# Patient Record
Sex: Male | Born: 1945
Health system: Southern US, Community
[De-identification: ages and names within clinical notes are randomized; demographics above are authoritative.]

## PROBLEM LIST (undated history)

## (undated) DIAGNOSIS — K4090 Unilateral inguinal hernia, without obstruction or gangrene, not specified as recurrent: Secondary | ICD-10-CM

## (undated) DIAGNOSIS — N189 Chronic kidney disease, unspecified: Secondary | ICD-10-CM

## (undated) DIAGNOSIS — F419 Anxiety disorder, unspecified: Secondary | ICD-10-CM

## (undated) DIAGNOSIS — C801 Malignant (primary) neoplasm, unspecified: Secondary | ICD-10-CM

## (undated) DIAGNOSIS — I10 Essential (primary) hypertension: Secondary | ICD-10-CM

## (undated) DIAGNOSIS — R05 Cough: Secondary | ICD-10-CM

## (undated) DIAGNOSIS — M109 Gout, unspecified: Secondary | ICD-10-CM

## (undated) DIAGNOSIS — Z923 Personal history of irradiation: Secondary | ICD-10-CM

## (undated) DIAGNOSIS — E119 Type 2 diabetes mellitus without complications: Secondary | ICD-10-CM

## (undated) DIAGNOSIS — E785 Hyperlipidemia, unspecified: Secondary | ICD-10-CM

## (undated) DIAGNOSIS — R053 Chronic cough: Secondary | ICD-10-CM

## (undated) DIAGNOSIS — M1990 Unspecified osteoarthritis, unspecified site: Secondary | ICD-10-CM

## (undated) HISTORY — DX: Type 2 diabetes mellitus without complications: E11.9

## (undated) HISTORY — PX: COLONOSCOPY W/ POLYPECTOMY: SHX1380

## (undated) HISTORY — PX: KNEE SURGERY: SHX244

## (undated) HISTORY — DX: Essential (primary) hypertension: I10

## (undated) HISTORY — PX: LYMPH NODE BIOPSY: SHX201

## (undated) HISTORY — PX: OTHER SURGICAL HISTORY: SHX169

## (undated) HISTORY — DX: Hyperlipidemia, unspecified: E78.5

---

## 2014-11-13 ENCOUNTER — Other Ambulatory Visit (HOSPITAL_COMMUNITY)
Admission: RE | Admit: 2014-11-13 | Discharge: 2014-11-13 | Disposition: A | Discharge status: Home / Self Care | Payer: Medicare Other | Source: Ambulatory Visit | Attending: Otolaryngology | Admitting: Otolaryngology

## 2014-11-13 DIAGNOSIS — R221 Localized swelling, mass and lump, neck: Secondary | ICD-10-CM | POA: Diagnosis present

## 2014-11-15 ENCOUNTER — Other Ambulatory Visit: Payer: Self-pay | Admitting: Otolaryngology

## 2014-11-15 DIAGNOSIS — R221 Localized swelling, mass and lump, neck: Secondary | ICD-10-CM

## 2014-11-21 ENCOUNTER — Ambulatory Visit
Admission: RE | Admit: 2014-11-21 | Discharge: 2014-11-21 | Disposition: A | Discharge status: Home / Self Care | Payer: Medicare Other | Source: Ambulatory Visit | Attending: Otolaryngology | Admitting: Otolaryngology

## 2014-11-21 DIAGNOSIS — R221 Localized swelling, mass and lump, neck: Secondary | ICD-10-CM

## 2014-11-21 MED ORDER — IOHEXOL 300 MG/ML  SOLN: 50.0000 mL | Freq: Once | INTRAMUSCULAR | Status: AC | PRN

## 2014-11-25 ENCOUNTER — Telehealth: Payer: Self-pay | Admitting: *Deleted

## 2014-11-25 ENCOUNTER — Telehealth: Payer: Self-pay | Admitting: Hematology and Oncology

## 2014-11-25 ENCOUNTER — Encounter: Payer: Self-pay | Admitting: *Deleted

## 2014-11-25 NOTE — Progress Notes (Signed)
Entry error

## 2014-11-25 NOTE — Telephone Encounter (Addendum)
Oncology Nurse Navigator Documentation  Oncology Nurse Navigator Flowsheets 11/25/2014  Referral date to RadOnc/MedOnc 02774  Navigator Encounter Type Introductory phone call  Time Spent with Patient 15   Placed introductory call to new referral patient. 1. Introduced myself as the oncology nurse navigator that works with Dr.Murray to whom he has been referred by Dr. Redmond Baseman and with whom he has an appt this Wed at 10:30.  He understands this appt is preceded by a NE at 10:00.  2. I briefly explained my role as his navigator, I indicated that I would be joining him during the appt. 3. I confirmed his understanding of the [facility] location, explained the arrival and RadOnc registration process. 4. I provided him my contact information, encouraged him to call with questions/concerns before Wed. 5. He indicated understanding Dr. Redmond Baseman is also referring him to [place].  I later LVM with Dr. Redmond Baseman office asking for a) confirmation of this referral, b) copy of bx conducted 5/5(6) per patient. 6. He stated he is leaving this Friday for a 3-week stay in [place]. 7. He verbalized understanding of information provided, expressed appreciation for my call.  Gayleen Orem, RN, BSN, [place] at [place] [phone]

## 2014-11-25 NOTE — Telephone Encounter (Signed)
Patient scheduled to see MD 05/17 @ 3:30.

## 2014-11-25 NOTE — Telephone Encounter (Signed)
Oncology Nurse Navigator Documentation  Oncology Nurse Navigator Flowsheets 11/25/2014 11/25/2014  Referral date to RadOnc/MedOnc 64051 -  Navigator Encounter Type Introductory phone call Telephone  Time Spent with Patient 15 15   Called patient, he confirmed his ability to see Dr. Alvy Bimler tomorrow at 3:30.  Rhys Martini and Dr. Alvy Bimler notified.  Gayleen Orem, RN, BSN, [place] at [place] [phone]

## 2014-11-26 ENCOUNTER — Encounter: Payer: Self-pay | Admitting: Hematology and Oncology

## 2014-11-26 ENCOUNTER — Telehealth: Payer: Self-pay | Admitting: Hematology and Oncology

## 2014-11-26 ENCOUNTER — Encounter: Payer: Self-pay | Admitting: Radiation Oncology

## 2014-11-26 ENCOUNTER — Telehealth: Payer: Self-pay | Admitting: *Deleted

## 2014-11-26 ENCOUNTER — Ambulatory Visit (HOSPITAL_BASED_OUTPATIENT_CLINIC_OR_DEPARTMENT_OTHER): Payer: Medicare Other | Admitting: Hematology and Oncology

## 2014-11-26 ENCOUNTER — Ambulatory Visit: Payer: Medicare Other

## 2014-11-26 ENCOUNTER — Encounter: Payer: Self-pay | Admitting: *Deleted

## 2014-11-26 VITALS — BP 153/72 | HR 78 | Temp 98.1°F | Resp 18 | Ht 68.0 in | Wt 178.1 lb

## 2014-11-26 DIAGNOSIS — C119 Malignant neoplasm of nasopharynx, unspecified: Secondary | ICD-10-CM | POA: Diagnosis not present

## 2014-11-26 DIAGNOSIS — Z85819 Personal history of malignant neoplasm of unspecified site of lip, oral cavity, and pharynx: Secondary | ICD-10-CM | POA: Insufficient documentation

## 2014-11-26 MED ORDER — LORAZEPAM 1 MG PO TABS
1.0000 mg | ORAL_TABLET | Freq: Two times a day (BID) | ORAL | Status: DC | PRN
Start: 2014-11-26 — End: 2014-12-23

## 2014-11-26 NOTE — Progress Notes (Addendum)
Head and Neck Cancer Location of Tumor / Histology: Nasopharyngeal  Patient presented with bilateral palpable neck lesions 8 months prior to 11/21/14.he denies any hearing deficit, difficulties with chewing food, swallowing difficulties, painful swallowing, changes in the quality of his voice or abnormal weight loss. He complained of persistent nasal drainage but denies epistaxis.    CT Neck IMPRESSION: 1. Left nasopharyngeal soft tissue mass with left > right level 2B and level 5 ex-nodal metastatic disease. Poorly marginated primary lesion measuring up to 30-35 mm. Ex-nodal disease encompassing 54 mm on the left and 34 mm on the right. 2. Top differential considerations are primary nasopharyngeal carcinoma versus pharyngeal squamous cell carcinoma. No skullbase invasion is evident by CT. 3. No distant metastatic disease identified in the upper chest. 4. Left mastoid effusion. Paranasal sinus inflammation.  Biopsies of Left Neck Mass (if applicable) revealed: Dr. Melida Quitter: Biopsy of the left neck  5/6/216 Malignant Cells Consistent  With Squamous Cell Carcinoma  Nutrition Status Yes No Comments  Weight changes? []  [x]  Weight 178.1 on 11/26/14  Swallowing concerns? []  [x]    PEG? []  []     Referrals Yes No Comments  Social Work? []  []    Dentistry? []  []    Swallowing therapy? []  []  Reports Post Nasal Drip on left side  Nutrition? []  []    Med/Onc? [x]  []  Dr. Alvy Bimler.  Chemo EDU 11/28/14   Safety Issues Yes No Comments  Prior radiation? []  [x]    Pacemaker/ICD? []  [x]    Possible current pregnancy? []  [x]    Is the patient on methotrexate? []  [x]     Tobacco/Marijuana/Snuff/ETOH use: former Smoker. 1ppd x 25 years, stopped in jan. 2000   Past/Anticipated interventions by otolaryngology, if any: Dr. Melida Quitter : Biopsy Left neck mass  Past/Anticipated interventions by medical oncology, if any: Dr. Heath Lark: Seen on 11/26/14   Current Complaints / other details:     Denies  any dysphagia nor odonyphagia and denies weight loss

## 2014-11-26 NOTE — Telephone Encounter (Addendum)
Oncology Nurse Navigator Documentation  Oncology Nurse Navigator Flowsheets 11/25/2014 11/25/2014 11/26/2014  Referral date to RadOnc/MedOnc 75797 - -  Navigator Encounter Type Introductory phone call Telephone Telephone  Time Spent with Patient 15 15 15     LVM request with Dr. Redmond Baseman' RN for faxing of patient's bx report to my attention.  Gayleen Orem, RN, BSN, [place] at [place] [phone]

## 2014-11-26 NOTE — Telephone Encounter (Signed)
Added appts...the patient will pick up appts

## 2014-11-26 NOTE — Progress Notes (Signed)
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 male is here because of newly diagnosed squamous cell carcinoma. According to the patient, the first initial presentation was due to self palpated bilateral neck lumps. He stated that he had felt them for over 6-8 months. Over the last 6 weeks, the left neck lymphadenopathy has increased in size significantly. He saw his primary care doctor and was subsequently referred to see ENT he denies any hearing deficit, difficulties with chewing food, swallowing difficulties, painful swallowing, changes in the quality of his voice or abnormal weight loss. He complained of persistent nasal drainage but denies epistaxis. At the ENT office, he underwent fine-needle aspirate of the left neck mass. Summary of oncologic history is as follows:   Nasopharyngeal cancer   11/13/2014 Procedure FNA of neck mass showed squamous cell carcinoma [id number]   11/21/2014 Imaging CT neck showed nasopharyngeal mass and bilateral cervical lymphadenopathy   MEDICAL HISTORY:  Past Medical History  Diagnosis Date  . Hyperlipidemia   . Diabetes mellitus without complication   . Hypertension     SURGICAL HISTORY: Past Surgical History  Procedure Laterality Date  . Ln biopsy    . Colonoscopy    . Knee surgery Left     SOCIAL HISTORY: History   Social History  . Marital Status: Unknown    Spouse Name: N/A  . Number of Children: N/A  . Years of Education: N/A   Occupational History  . Not on file.   Social History Main Topics  . Smoking status: Former Smoker -- 1.00 packs/day for 25 years    Quit date: 07/12/1998  . Smokeless tobacco: Never Used  . Alcohol Use: No  .  Drug Use: No  . Sexual Activity: Not on file   Other Topics Concern  . Not on file   Social History Narrative  . No narrative on file    FAMILY HISTORY: Family History  Problem Relation Age of Onset  . Cancer Mother     throat ca  . Cancer Father     lung ca  . Cancer Brother     throat ca    ALLERGIES:  has No Known Allergies.  MEDICATIONS:  Current Outpatient Prescriptions  Medication Sig Dispense Refill  . amLODipine (NORVASC) 5 MG tablet Take 5 mg by mouth daily.    Marland Kitchen atorvastatin (LIPITOR) 20 MG tablet Take 20 mg by mouth daily.    . diclofenac (VOLTAREN) 75 MG EC tablet Take 75 mg by mouth as needed.    Marland Kitchen glimepiride (AMARYL) 2 MG tablet Take 2 mg by mouth daily.  4  . losartan (COZAAR) 50 MG tablet Take 50 mg by mouth daily.  4  . Multiple Vitamins-Minerals (CENTRUM SILVER PO) Take by mouth daily.    . Omega-3 Fatty Acids (FISH OIL PO) Take by mouth daily.    Marland Kitchen LORazepam (ATIVAN) 1 MG tablet Take 1 tablet (1 mg total) by mouth 2 (two) times daily as needed for anxiety. 10 tablet 0   No current facility-administered medications for this visit.    REVIEW OF SYSTEMS:   Constitutional: Denies fevers, chills or abnormal night sweats Eyes: Denies blurriness of vision, double vision or watery eyes Ears, nose, mouth, throat, and face: Denies mucositis or sore throat  Respiratory: Denies cough, dyspnea or wheezes Cardiovascular: Denies palpitation, chest discomfort or lower extremity swelling Gastrointestinal:  Denies nausea, heartburn or change in bowel habits Skin: Denies abnormal skin rashes Neurological:Denies numbness, tingling or new weaknesses Behavioral/Psych: Mood is stable, no new changes  All other systems were reviewed with the patient and are negative.  PHYSICAL EXAMINATION: ECOG PERFORMANCE STATUS: 0 - Asymptomatic  Filed Vitals:   11/26/14 1535  BP: 153/72  Pulse: 78  Temp: 98.1 F (36.7 C)  Resp: 18   Filed Weights   11/26/14 1535  Weight:  178 lb 1.6 oz (80.786 kg)    GENERAL:alert, no distress and comfortable SKIN: skin color, texture, turgor are normal, no rashes or significant lesions EYES: normal, conjunctiva are pink and non-injected, sclera clear OROPHARYNX:no exudate, no erythema and lips, buccal mucosa, and tongue normal  NECK: supple, thyroid normal size, non-tender, without nodularity LYMPH:  Palpable lymphadenopathy in both Thal of the neck. Non-elsewhere.  LUNGS: clear to auscultation and percussion with normal breathing effort HEART: regular rate & rhythm and no murmurs and no lower extremity edema ABDOMEN:abdomen soft, non-tender and normal bowel sounds Musculoskeletal:no cyanosis of digits and no clubbing  PSYCH: alert & oriented x 3 with fluent speech NEURO: no focal motor/sensory deficits  LABORATORY DATA: N/A RADIOGRAPHIC STUDIES: I have personally reviewed the radiological images as listed and agreed with the findings in the report. Ct Soft Tissue Neck W Contrast  11/21/2014   CLINICAL DATA:  69 year old male with bilateral palpable neck lesions discovered 8 months ago. Recently increased size of the left lesion. No associated pain. Initial encounter.  EXAM: CT NECK WITH CONTRAST  TECHNIQUE: Multidetector CT imaging of the neck was performed using the standard protocol following the bolus administration of intravenous contrast.  CONTRAST:  50 mL Omnipaque 300.  COMPARISON:  None.  FINDINGS: Pharynx and larynx: Abnormal mass like soft tissue contour of the left nasopharynx at or just above the fossa of Rosenmuller (series 3, image 15 and coronal image 31). Posterior and lateral margins are indistinct. This encompasses 26 x 35 x 29 mm (AP by transverse by CC). Effacement of the left parapharyngeal fat. The lesion is inseparable from the left longus coli muscle. This is at the level of the pterygoid plates.  Below this the palatine tonsils and soft palate appear within normal limits. The hypopharynx, epiglottis  and larynx appear within normal limits. The retropharyngeal and sublingual spaces are within normal limits.  Salivary glands: Submandibular and parotid glands are within normal limits.  Thyroid: Negative.  Lymph nodes:  Spiculated and conglomerate and left level 2/level 5 nodal mass on the left encompassing 54 x 22 x 40 mm (AP by transverse by CC). See sagittal image 49. The anterior extent of this is partially cystic or necrotic (series 3, image 27). The caudal posterior aspect corresponds to the marked palpable area of concern.  Similarly heterogeneous and spiculated right level 2 B nodal mass measuring 21 x 19 x 34 mm. See sagittal image 20. This corresponds to the marked palpable area of concern.  Mild stranding of the left level 5 soft tissues below the conglomerate nodal mass. Small bilateral level 5 nodes are normal by size criteria and morphology. The anterior level IIa nodes bilaterally also appear within normal limits. No level 1 lymphadenopathy. Mildly asymmetric but normal by size criteria left level IIIb node on series 3, image 47. No level 4 lymphadenopathy.  Vascular: Bilateral carotid calcified plaque. Major vascular structures appear to remain patent. The  left carotid space an the right IJ are abutted by the conglomerate nodal masses.  Limited intracranial: Negative.  Visualized orbits: Negative.  Mastoids and visualized paranasal sinuses: Widespread paranasal sinus mucosal thickening and opacification. Left mastoid effusion. Both tympanic cavities are clear. Right mastoids are clear.  Skeleton: Bone mineralization at the skullbase appears within normal limits. The clivus is within normal limits.  Degenerative changes throughout the cervical spine. No acute or suspicious osseous lesion identified.  Upper chest: No superior mediastinal lymphadenopathy. Mild dependent atelectasis. No upper lung nodule. No axillary lymphadenopathy.  IMPRESSION: 1. Left nasopharyngeal soft tissue mass with left > right  level 2B and level 5 ex-nodal metastatic disease. Poorly marginated primary lesion measuring up to 30-35 mm. Ex-nodal disease encompassing 54 mm on the left and 34 mm on the right. 2. Top differential considerations are primary nasopharyngeal carcinoma versus pharyngeal squamous cell carcinoma. No skullbase invasion is evident by CT. 3. No distant metastatic disease identified in the upper chest. 4. Left mastoid effusion.  Paranasal sinus inflammation.   Electronically Signed   By: Genevie Ann M.D.   On: 11/21/2014 15:03    ASSESSMENT:  Newly diagnosed squamous cell carcinoma of the Head & Neck  PLAN:  Nasopharyngeal cancer The patient has significant advanced presentation of suspected nasopharyngeal carcinoma. We discussed the importance of staging. Previously, he had mild claustrophobia with MRI. I gave him prescription lorazepam to use. I plan to restage him with MRI along with PET CT scan. His wife indicated the patient have chronic renal failure in the past. That might limit therapeutic options. I plan to order blood work to be done tomorrow before he sees radiation oncologist. He would need a multidisciplinary team approach and I have sent multiple referrals to see nutritionist, speech & language therapist, dentist, physical therapist, social worker and attend chemotherapy education class. The patient had originally planned to leave Friday evening to spend 3 weeks at his daughter's home in [place]. This would significantly delay the start of treatment and evaluation. I warned him about potential disease progression in the next few weeks and that could potentially lead to irreversible organ damage such as losing eyesight if the tumor starts invading into the orbit, cranial nerve dysfunction or potentially cause severe nosebleeds. He would think about whether he wants to return home early to complete all the necessary workup so that we can start treatment as soon as possible. I will get his case  presented at the ENT tumor board tomorrow. The patient may need placement of port and feeding tube in the future but I am holding off putting those orders until results of imaging studies are available.      Orders Placed This Encounter  Procedures  . NM PET Image Initial (PI) Skull Base To Thigh    Standing Status: Future     Number of Occurrences:      Standing Expiration Date: 12/31/2015    Order Specific Question:  Reason for Exam (SYMPTOM  OR DIAGNOSIS REQUIRED)    Answer:  staging nasopharyngeal ca    Order Specific Question:  Preferred imaging location?    Answer:  [facility]  . MR Brain W Wo Contrast    Standing Status: Future     Number of Occurrences:      Standing Expiration Date: 12/31/2015    Order Specific Question:  Reason for exam:    Answer:  nasopharyngeal ca staging    Order Specific Question:  Preferred imaging location?  Answer:  [facility]    Order Specific Question:  Does the patient have a pacemaker or implanted devices?    Answer:  No  . CBC with Differential/Platelet    Standing Status: Future     Number of Occurrences:      Standing Expiration Date: 12/31/2015  . Comprehensive metabolic panel    Standing Status: Future     Number of Occurrences:      Standing Expiration Date: 12/31/2015  . Ambulatory Referral to Speech Therapy  (specifically to Garald Balding)    Referral Priority:  Routine    Referral Type:  Speech Therapy    Referral Reason:  Specialty Services Required    Requested Specialty:  Speech Pathology    Number of Visits Requested:  1  . Ambulatory Referral to Dentistry (specifically to Dr. Enrique Sack)    Referral Priority:  Routine    Referral Type:  Consultation    Referral Reason:  Specialty Services Required    Requested Specialty:  Dental General Practice    Number of Visits Requested:  1  . Amb Referral to Nutrition and Diabetic Education (specifically to Ernestene Kiel)    Referral Priority:  Routine     Referral Type:  Consultation    Referral Reason:  Specialty Services Required    Number of Visits Requested:  1  . Ambulatory Referral to Social Work    Referral Priority:  Routine    Referral Type:  Consultation    Referral Reason:  Specialty Services Required    Number of Visits Requested:  1  . Ambulatory Referral to Physical Therapy    Referral Priority:  Routine    Referral Type:  Physical Medicine    Referral Reason:  Specialty Services Required    Requested Specialty:  Physical Therapy    Number of Visits Requested:  1    All questions were answered. The patient knows to call the clinic with any problems, questions or concerns. I spent 55 minutes counseling the patient face to face. The total time spent in the appointment was 60 minutes and more than 50% was on counseling.     [place], [place], MD 11/26/2014 4:30 PM

## 2014-11-26 NOTE — Progress Notes (Signed)
Oncology Nurse Navigator Documentation  Oncology Nurse Navigator Flowsheets 11/25/2014 11/25/2014 11/26/2014 11/26/2014  Referral date to RadOnc/MedOnc 04599 - - -  Navigator Encounter Type Introductory phone call Telephone Telephone Initial MedOnc  Patient Visit Type - - - Medonc  Barriers/Navigation Needs - - - No barriers at this time  Interventions - - - None required  Time Spent with Patient _0 90   Met with patient during initial consult with Dr. Alvy Bimler.  His wife accompanied him.   Further introduced myself as his Navigator, explained my role as a member of the Care Team, provided contact information, encouraged them to contact me with questions/concerns as treatments/procedures begin. Provided New Patient Information packet:  Contact information for physicians, navigator and other members of the Care Team.  Fall Prevention Patient Safety Plan.  Appointment Guideline.  [place] with highlight of [place]. Patient indicated he has Advance Directives, will bring copies for scanning. We discussed options to planned vacation schedule in context of pending PET, MRI, dental consult, potential oral surgery.  We will discuss further tomorrow. Assisted with post-consult appt scheduling. We discussed the arrival procedure for tomorrow's appt with Dr. Valere Dross. They understand I will be joining them tomorrow.  Gayleen Orem, RN, BSN, [place] at [place] [phone]

## 2014-11-26 NOTE — Progress Notes (Signed)
Entry error

## 2014-11-26 NOTE — Telephone Encounter (Signed)
Entry error

## 2014-11-26 NOTE — Progress Notes (Signed)
Checked in new pt with no financial concerns. °

## 2014-11-26 NOTE — Assessment & Plan Note (Signed)
The patient has significant advanced presentation of suspected nasopharyngeal carcinoma. We discussed the importance of staging. Previously, he had mild claustrophobia with MRI. I gave him prescription lorazepam to use. I plan to restage him with MRI along with PET CT scan. His wife indicated the patient have chronic renal failure in the past. That might limit therapeutic options. I plan to order blood work to be done tomorrow before he sees radiation oncologist. He would need a multidisciplinary team approach and I have sent multiple referrals to see nutritionist, speech & language therapist, dentist, physical therapist, social worker and attend chemotherapy education class. The patient had originally planned to leave Friday evening to spend 3 weeks at his daughter's home in [place]. This would significantly delay the start of treatment and evaluation. I warned him about potential disease progression in the next few weeks and that could potentially lead to irreversible organ damage such as losing eyesight if the tumor starts invading into the orbit, cranial nerve dysfunction or potentially cause severe nosebleeds. He would think about whether he wants to return home early to complete all the necessary workup so that we can start treatment as soon as possible. I will get his case presented at the ENT tumor board tomorrow. The patient may need placement of port and feeding tube in the future but I am holding off putting those orders until results of imaging studies are available.

## 2014-11-27 ENCOUNTER — Encounter: Payer: Self-pay | Admitting: *Deleted

## 2014-11-27 ENCOUNTER — Ambulatory Visit
Admission: RE | Admit: 2014-11-27 | Discharge: 2014-11-27 | Disposition: A | Discharge status: Home / Self Care | Payer: Medicare Other | Source: Ambulatory Visit | Attending: Radiation Oncology | Admitting: Radiation Oncology

## 2014-11-27 ENCOUNTER — Encounter: Payer: Self-pay | Admitting: Radiation Oncology

## 2014-11-27 ENCOUNTER — Telehealth: Payer: Self-pay | Admitting: *Deleted

## 2014-11-27 ENCOUNTER — Telehealth: Payer: Self-pay | Admitting: Hematology and Oncology

## 2014-11-27 ENCOUNTER — Other Ambulatory Visit (HOSPITAL_BASED_OUTPATIENT_CLINIC_OR_DEPARTMENT_OTHER): Payer: Medicare Other

## 2014-11-27 VITALS — BP 165/71 | HR 79 | Temp 98.1°F | Ht 68.0 in | Wt 177.5 lb

## 2014-11-27 DIAGNOSIS — C119 Malignant neoplasm of nasopharynx, unspecified: Secondary | ICD-10-CM | POA: Diagnosis not present

## 2014-11-27 LAB — COMPREHENSIVE METABOLIC PANEL (CC13)
ALBUMIN: 3.5 g/dL (ref 3.5–5.0)
ALK PHOS: 72 U/L (ref 40–150)
ALT: 24 U/L (ref 0–55)
AST: 22 U/L (ref 5–34)
Anion Gap: 11 mEq/L (ref 3–11)
BUN: 29.5 mg/dL — ABNORMAL HIGH (ref 7.0–26.0)
CO2: 23 meq/L (ref 22–29)
Calcium: 9.2 mg/dL (ref 8.4–10.4)
Chloride: 108 mEq/L (ref 98–109)
Creatinine: 1.4 mg/dL — ABNORMAL HIGH (ref 0.7–1.3)
EGFR: 54 mL/min/{1.73_m2} — ABNORMAL LOW (ref >90)
Glucose: 129 mg/dl (ref 70–140)
Potassium: 4.6 mEq/L (ref 3.5–5.1)
Sodium: 142 mEq/L (ref 136–145)
TOTAL PROTEIN: 6.8 g/dL (ref 6.4–8.3)
Total Bilirubin: 0.52 mg/dL (ref 0.20–1.20)

## 2014-11-27 LAB — CBC WITH DIFFERENTIAL/PLATELET
BASO%: 1 % (ref 0.0–2.0)
Basophils Absolute: 0.1 10*3/uL (ref 0.0–0.1)
EOS ABS: 0.3 10*3/uL (ref 0.0–0.5)
EOS%: 5.5 % (ref 0.0–7.0)
HCT: 41.4 % (ref 38.4–49.9)
HGB: 14 g/dL (ref 13.0–17.1)
LYMPH#: 0.9 10*3/uL (ref 0.9–3.3)
LYMPH%: 16.8 % (ref 14.0–49.0)
MCH: 28.7 pg (ref 27.2–33.4)
MCHC: 33.8 g/dL (ref 32.0–36.0)
MCV: 85 fL (ref 79.3–98.0)
MONO#: 0.6 10*3/uL (ref 0.1–0.9)
MONO%: 11.5 % (ref 0.0–14.0)
NEUT#: 3.6 10*3/uL (ref 1.5–6.5)
NEUT%: 65.2 % (ref 39.0–75.0)
PLATELETS: 190 10*3/uL (ref 140–400)
RBC: 4.87 10*6/uL (ref 4.20–5.82)
RDW: 13.4 % (ref 11.0–14.6)
WBC: 5.5 10*3/uL (ref 4.0–10.3)

## 2014-11-27 NOTE — Progress Notes (Addendum)
Oncology Nurse Navigator Documentation  Oncology Nurse Navigator Flowsheets 11/25/2014 11/25/2014 11/26/2014 11/26/2014 11/27/2014  Referral date to RadOnc/MedOnc 59409 - - - -  Navigator Encounter Type Introductory phone call Telephone Telephone Initial MedOnc Initial RadOnc  Patient Visit Type - - - Medonc Radonc  Barriers/Navigation Needs - - - No barriers at this time No barriers at this time  Interventions - - - None required None required  Time Spent with Patient $RemoveBefo'15 15 15 '[id number]$ 90 90   Met with patient during initial consult with Dr. Valere Dross.  His wife Adam Yang accompanied him. 1. Provided introductory explanation of radiation treatment including SIM planning and fitting of head mask, showed them example of mask.   2. Provided a tour of SIM and Tomo areas, explained treatment and arrival procedures.   3. Showed them location of Dental Medicine and Radiology for upcoming appts. They verbalized understanding of information provided.    Gayleen Orem, RN, BSN, [place] at [place] [phone]

## 2014-11-27 NOTE — Progress Notes (Signed)
[facility] NEW PATIENT EVALUATION  Name: Adam Yang MRN: [id number]  Date:   11/27/2014           DOB: Nov 23, 1945  Status: outpatient   CC:  Melinda Crutch, MD  Melida Quitter, MD , Dr. Heath Lark, Dr. Jorja Loa   REFERRING PHYSICIAN: Melida Quitter, MD   DIAGNOSIS: Squamous cell carcinoma of the nasopharynx, clinical stage IV (T2 N2c MX)   HISTORY OF PRESENT ILLNESS:  Adam Yang is a 69 y.o. male who is seen today through the courtesy of Dr. Redmond Baseman for evaluation and management of his clinical stage T2 N2c MX  squamous cell carcinoma of the nasopharynx.  He states that he noted the development of masses within his neck approximately 8 months ago.  He noted more enlargement of a left upper neck mass and he was referred by Dr. Harrington Challenger to Dr. Redmond Baseman for further evaluation.  Dr. Redmond Baseman performed a fine-needle last patient biopsy on 11/13/2014 which was diagnostic for squamous cell carcinoma.  On 11/21/2014 he underwent a CT scan of his neck which showed a left nasopharyngeal mass just above the fossa of Rosenmuller.  The posterior and lateral margins were indistinct.  The mass measured 2.6 x 3.5 x 2.9 cm.  There was effacement of the left parapharyngeal fat.  The mass was inseparable from the left longus coli muscle at the level of the pterygoid plates.  He was also found have bilateral neck adenopathy, left greater than right level IIb and level V measuring 5.4 x 2.2 x 4.0 cm.  There was some mild stranding below the conglomerate nodal mass.  Small bilateral level V nodes were normal by size criteria.  Mild asymmetric, but normal by size criteria left level IIIb node was seen.  On the right level IIb was a 2.1 x 1.9 x 3.4 cm mass On 10-11-202016 he underwent a nasal endoscopy with biopsy.  Pathology is pending at the time this dictation.  Dr. Redmond Baseman noted a left nasopharyngeal mass.  The patient was seen by Dr. Alvy Bimler yesterday, and she is getting him scheduled for MRI to  include the skull base and also a PET scan.  The patient will see Dr. Enrique Sack tomorrow.  PREVIOUS RADIATION THERAPY: No   PAST MEDICAL HISTORY:  has a past medical history of Hyperlipidemia; Diabetes mellitus without complication; and Hypertension.     PAST SURGICAL HISTORY:  Past Surgical History  Procedure Laterality Date  . Ln biopsy    . Colonoscopy    . Knee surgery Left      FAMILY HISTORY: family history includes Cancer in his brother, father, and mother; Heart block in his brother. His father died of lung cancer to age 66.  His mother died from throat cancer at 22.   SOCIAL HISTORY:  reports that he quit smoking about 16 years ago. He has never used smokeless tobacco. He reports that he does not drink alcohol or use illicit drugs.  Married, one child.  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   ALLERGIES: Review of patient's allergies indicates no known allergies.   MEDICATIONS:  Current Outpatient Prescriptions  Medication Sig Dispense Refill  . amLODipine (NORVASC) 5 MG tablet Take 5 mg by mouth daily.    Marland Kitchen atorvastatin (LIPITOR) 20 MG tablet Take 20 mg by mouth daily.    Marland Kitchen glimepiride (AMARYL) 2 MG tablet Take 2 mg by mouth daily.  4  . LORazepam (ATIVAN) 1 MG tablet Take 1 tablet (1 mg total) by mouth 2 (two) times daily as needed for anxiety. 10 tablet 0  . losartan (COZAAR) 50 MG tablet Take 50 mg by mouth daily.  4  . Multiple Vitamins-Minerals (CENTRUM SILVER PO) Take by mouth daily.    . Omega-3 Fatty Acids (FISH OIL PO) Take by mouth daily.    . diclofenac (VOLTAREN) 75 MG EC tablet Take 75 mg by mouth as needed.     No current facility-administered medications for this encounter.     REVIEW OF SYSTEMS:  Pertinent items are noted in HPI.    PHYSICAL EXAM:  height is 5\' 8"  (1.727 m) and weight is 177 lb 8 oz (80.513 kg). His temperature is 98.1 F (36.7 C). His blood pressure is 165/71 and his  pulse is 79.   Alert and oriented 69 year old male appearing his stated age.  Nodes: There is bilateral level II and level V adenopathy, left greater than right.  On the left conglomeration of nodes measure approximately 5 cm in greatest diameter.  On the left they measure approximately 4 cm.  There is no other adenopathy appreciated in level I , 3, or 4.  Oral cavity remarkable for partial lower mandibular bridge.  Upper dentition in fair repair with numerous restorations.  Oropharynx unremarkable to inspection.  Indirect and your examination and fiberoptic examination not performed.  Cranial nerve examination grossly nonfocal.   LABORATORY DATA:  Lab Results  Component Value Date   WBC 5.5 11/27/2014   HGB 14.0 11/27/2014   HCT 41.4 11/27/2014   MCV 85.0 11/27/2014   PLT 190 11/27/2014   Lab Results  Component Value Date   NA 142 11/27/2014   K 4.6 11/27/2014   CO2 23 11/27/2014   Lab Results  Component Value Date   ALT 24 11/27/2014   AST 22 11/27/2014   ALKPHOS 72 11/27/2014   BILITOT 0.52 11/27/2014      IMPRESSION: Squamous cell carcinoma of the nasopharynx, clinical stage IV (T2 N2c MX).  I explained to the patient and his wife that he will need to undergo a MRI scan and PET scan to complete his staging workup.  He understands that if he has local regional disease alone, he has a realistic chance for being cured with chemoradiation which is the treatment of choice.  He will visit Dr. Enrique Sack tomorrow before assessment of his dentition.  He knows that we may need to wait at least 2 weeks for extraction site healing before beginning radiation therapy, but we can begin his treatment planning/CT simulation immediately following his extractions.  He will also need placement of a PEG tube for nutrition along with nutritional consultation and also speech pathology consultation as well.  We discussed the potential acute and late toxicities of radiation therapy along with chemotherapy.  He  will need approximately 7 weeks of radiation therapy.  He knows that he will be unable to work during his chemoradiation.  He indicated that he would like to travel to see family in [place], but I told him that moving ahead with his diagnostic evaluation/staging, possible dental extractions, and treatment planning should be his main priority.    PLAN: As discussed above.  Following his staging evaluation and assessment by Dr. Enrique Sack we will then coordinate his treatment planning.  I will coordinate his start of radiation therapy with Dr. Alvy Bimler.  I spent 45 minutes face to face with the patient and more  than 50% of that time was spent in counseling and/or coordination of care.

## 2014-11-27 NOTE — Addendum Note (Signed)
Encounter addended by: Benn Moulder, RN on: 11/27/2014  7:54 PM<BR>     Documentation filed: Charges VN

## 2014-11-27 NOTE — Addendum Note (Signed)
Encounter addended by: Benn Moulder, RN on: 11/27/2014  1:51 PM<BR>     Documentation filed: Charges VN

## 2014-11-27 NOTE — Addendum Note (Signed)
Encounter addended by: Benn Moulder, RN on: 11/27/2014  7:55 PM<BR>     Documentation filed: Charges VN

## 2014-11-27 NOTE — Telephone Encounter (Signed)
lvm for pt regarding to 5.23 and 5.24

## 2014-11-27 NOTE — Telephone Encounter (Signed)
Oncology Nurse Navigator Documentation  Oncology Nurse Navigator Flowsheets 11/26/2014 11/26/2014 11/27/2014 11/27/2014 11/27/2014 11/27/2014 11/27/2014  Referral date to RadOnc/MedOnc - - - - - - -  Navigator Encounter Type Telephone Initial MedOnc Initial RadOnc Telephone Telephone Telephone Telephone  Patient Visit Type - Medonc Radonc - - - -  Barriers/Navigation Needs - No barriers at this time No barriers at this time - - - -  Interventions - None required None required - - - -  Time Spent with Patient 15 90 90 15 15 15 15    1153 - Spoke with Radiology scheduler [place], obtained several appt options for PET and MRI. 1203 - LVM on patient's and wife's mobile phones requesting call back to discuss scan appts. 1300 - Patient wife returned call, I communicated scheduling options, she stated she would communicate with husband, have him call me. 1730 - Patient called, stated preferences for PET and MRI appts.  I replied I will call Radiology Scheduling in am, confirm with him afterwards.  Gayleen Orem, RN, BSN, [place] at [place] [phone]

## 2014-11-28 ENCOUNTER — Encounter: Payer: Self-pay | Admitting: *Deleted

## 2014-11-28 ENCOUNTER — Encounter (HOSPITAL_COMMUNITY): Payer: Self-pay | Admitting: Dentistry

## 2014-11-28 ENCOUNTER — Telehealth: Payer: Self-pay | Admitting: *Deleted

## 2014-11-28 ENCOUNTER — Other Ambulatory Visit: Payer: Medicare Other

## 2014-11-28 ENCOUNTER — Ambulatory Visit (HOSPITAL_COMMUNITY): Payer: Self-pay | Admitting: Dentistry

## 2014-11-28 VITALS — BP 145/58 | HR 72 | Temp 98.6°F

## 2014-11-28 DIAGNOSIS — K0889 Other specified disorders of teeth and supporting structures: Secondary | ICD-10-CM | POA: Insufficient documentation

## 2014-11-28 DIAGNOSIS — K036 Deposits [accretions] on teeth: Secondary | ICD-10-CM

## 2014-11-28 DIAGNOSIS — M264 Malocclusion, unspecified: Secondary | ICD-10-CM

## 2014-11-28 DIAGNOSIS — Z01818 Encounter for other preprocedural examination: Secondary | ICD-10-CM | POA: Diagnosis not present

## 2014-11-28 DIAGNOSIS — K08409 Partial loss of teeth, unspecified cause, unspecified class: Secondary | ICD-10-CM

## 2014-11-28 DIAGNOSIS — C119 Malignant neoplasm of nasopharynx, unspecified: Secondary | ICD-10-CM

## 2014-11-28 DIAGNOSIS — IMO0002 Reserved for concepts with insufficient information to code with codable children: Secondary | ICD-10-CM

## 2014-11-28 DIAGNOSIS — K031 Abrasion of teeth: Secondary | ICD-10-CM | POA: Insufficient documentation

## 2014-11-28 DIAGNOSIS — K0530 Chronic periodontitis, unspecified: Secondary | ICD-10-CM

## 2014-11-28 DIAGNOSIS — K0820 Unspecified atrophy of edentulous alveolar ridge: Secondary | ICD-10-CM

## 2014-11-28 DIAGNOSIS — Z972 Presence of dental prosthetic device (complete) (partial): Secondary | ICD-10-CM

## 2014-11-28 NOTE — Telephone Encounter (Addendum)
Oncology Nurse Navigator Documentation  Oncology Nurse Navigator Flowsheets 11/27/2014 11/27/2014 11/28/2014 11/28/2014 11/28/2014 11/28/2014 11/28/2014  Referral date to RadOnc/MedOnc - - - - - - -  Navigator Encounter Type Telephone Telephone Telephone Telephone Other Telephone Telephone  Patient Visit Type - - - - - - -  Barriers/Navigation Needs - - - - - - -  Interventions - - - - - - -  Time Spent with Patient 15 15 15 15 30 15 15        [phone] - Called Radiology Scheduling, arranged for 12/04/14 0700 PET and 12/05/14 2100 MRI. 2876 - LVM on patient's mobile, indicating appts. 1110 - called Radiology Scheduling to reschedule MRI for Wed 12/04/14 2100 if possible.  [facility] indicated appt no longer vacant. 1120 - called patient, LVM on mobile that 12/05/14 2100 MRI appt unable to be changed.  Gayleen Orem, RN, BSN, [facility] at [place] [phone]

## 2014-11-28 NOTE — Progress Notes (Signed)
Oncology Nurse Navigator Documentation  Oncology Nurse Navigator Documentation  Oncology Nurse Navigator Flowsheets 11/27/2014 11/27/2014 11/28/2014 11/28/2014 11/28/2014 11/28/2014 11/28/2014  Referral date to RadOnc/MedOnc - - - - - - -  Navigator Encounter Type Telephone Telephone Telephone Telephone Other Telephone Telephone  Patient Visit Type - - - - - - -  Barriers/Navigation Needs - - - - - - -  Interventions - - - - - - -  Time Spent with Patient 15 15 15 15 30 15 15      Patient called from [facility] lobby s/p dental evaluation. He reported he is having oral surgery for extraction of remaining teeth next week Friday, 12/06/14. He asked if MRI presently scheduled for 12/05/14 2100 could be rescheduled.  I indicated I would check and call him to inform. I showed him location of MRI at [facility] .  Gayleen Orem, RN, BSN, [place] at [place] [phone]

## 2014-11-28 NOTE — Patient Instructions (Signed)

## 2014-11-28 NOTE — Progress Notes (Signed)
DENTAL CONSULTATION  Date of Consultation:  11/28/2014 Patient Name:   Adam Yang Date of Birth:   Nov 02, 1945 Medical Record Number: [id number]  VITALS: BP 145/58 mmHg  Pulse 72  Temp(Src) 98.6 F (37 C) (Oral)  CHIEF COMPLAINT: Patient referred by Dr. Arloa Koh for a dental consultation.  HPI: Adam Yang is a 69 year old male recently diagnosed with nasopharyngeal carcinoma. Patient with anticipated chemoradiation therapy. Patient is now seen as part of a medically necessary pre-chemoradiation therapy dental protocol examination.  The patient currently denies acute toothaches, swellings, or abscesses. Patient was last seen by his primary dentist in February 2016 for an exam and cleaning. Patient sees Dr. Burnard Bunting as his primary dentist. Patient has been going to Dr. Burnard Bunting for approximately 4-5 years on an every 6 month basis. Patient has a mandibular cast partial denture that was made in 2004 in [place].  PROBLEM LIST: Patient Active Problem List   Diagnosis Date Noted  . Nasopharyngeal cancer 11/26/2014    Priority: Medium    PMH: Past Medical History  Diagnosis Date  . Hyperlipidemia   . Diabetes mellitus without complication   . Hypertension     PSH: Past Surgical History  Procedure Laterality Date  . Lymph node biopsy    . Colonoscopy    . Knee surgery Left     ALLERGIES: No Known Allergies  MEDICATIONS: Current Outpatient Prescriptions  Medication Sig Dispense Refill  . amLODipine (NORVASC) 5 MG tablet Take 5 mg by mouth daily.    Marland Kitchen atorvastatin (LIPITOR) 20 MG tablet Take 20 mg by mouth daily.    . diclofenac (VOLTAREN) 75 MG EC tablet Take 75 mg by mouth as needed.    Marland Kitchen glimepiride (AMARYL) 2 MG tablet Take 2 mg by mouth daily.  4  . LORazepam (ATIVAN) 1 MG tablet Take 1 tablet (1 mg total) by mouth 2 (two) times daily as needed for anxiety. 10 tablet 0  . losartan (COZAAR) 50 MG tablet Take 50 mg by mouth daily.  4  . Multiple Vitamins-Minerals  (CENTRUM SILVER PO) Take by mouth daily.    . Omega-3 Fatty Acids (FISH OIL PO) Take by mouth daily.     No current facility-administered medications for this visit.    LABS: Lab Results  Component Value Date   WBC 5.5 11/27/2014   HGB 14.0 11/27/2014   HCT 41.4 11/27/2014   MCV 85.0 11/27/2014   PLT 190 11/27/2014      Component Value Date/Time   NA 142 11/27/2014 0941   K 4.6 11/27/2014 0941   CO2 23 11/27/2014 0941   GLUCOSE 129 11/27/2014 0941   BUN 29.5* 11/27/2014 0941   CREATININE 1.4* 11/27/2014 0941   CALCIUM 9.2 11/27/2014 0941   No results found for: INR, PROTIME No results found for: PTT  SOCIAL HISTORY: History   Social History  . Marital Status: Married    Spouse Name: N/A  . Number of Children: 1  . Years of Education: N/A   Occupational History  . Not on file.   Social History Main Topics  . Smoking status: Former Smoker -- 1.00 packs/day for 25 years    Quit date: 07/12/1998  . Smokeless tobacco: Never Used  . Alcohol Use: No  . Drug Use: No  . Sexual Activity: Not on file   Other Topics Concern  . Not on file   Social History Narrative   Patient is married and had 1 child. 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      FAMILY HISTORY: Family History  Problem Relation Age of Onset  . Cancer Mother     throat ca  . Cancer Father     lung ca  . Cancer Brother     throat ca  . Heart block Brother    REVIEW OF SYSTEMS: Reviewed with the patient and is included in dental record.  DENTAL HISTORY: CHIEF COMPLAINT: Patient referred by Dr. Arloa Koh for a dental consultation.  HPI: Adam Yang is a 69 year old male recently diagnosed with nasopharyngeal carcinoma. Patient with anticipated chemoradiation therapy. Patient is now seen as part of a medically necessary pre-chemoradiation therapy dental protocol examination.  The patient currently denies acute  toothaches, swellings, or abscesses. Patient was last seen by his primary dentist in February 2016 for an exam and cleaning. Patient sees Dr. Burnard Bunting as his primary dentist. Patient has been going to Dr. Burnard Bunting for approximately 4-5 years on an every 6 month basis. Patient has a mandibular cast partial denture that was made in 2004 in [place].  DENTAL EXAMINATION: GENERAL: The patient is a well-developed, well-nourished male in no acute distress. HEAD AND NECK: The patient has bilateral neck lymphadenopathy-left greater than right. The patient denies acute TMJ symptoms.  INTRAORAL EXAM: The patient has normal saliva. I do not see any evidence of intraoral abscess formation. Atrophy of the edentulous alveolar ridges is noted. DENTITION: Patient is missing tooth numbers 1, 3, 16, 17, 18, 19, 21, 23, 24, 25, 30, 31, and 32. PERIODONTAL: Patient has chronic periodontitis with minimal plaque accumulations, generalized gingival recession, and tooth mobility as charted. DENTAL CARIES/SUBOPTIMAL RESTORATIONS: No obvious dental caries are noted. Patient has multiple flexure lesions. Generalized maxillary and mandibular interincisal attrition is noted.  ENDODONTIC: Patient currently denies acute pulpitis symptoms. There may be incipient periapical pathology associated with tooth #26.  CROWN AND BRIDGE: There are no crown or bridge restorations. PROSTHODONTIC: The patient has a mandibular cast partial denture that is ill fitting and is missing denture teeth in the area of previous extractions. OCCLUSION: The patient has a poor occlusal scheme secondary to multiple missing teeth, supra-eruption and drifting of the unopposed teeth into the edentulous areas, and lack of replacement of the missing teeth with clinically acceptable dental prosthesis.  RADIOGRAPHIC INTERPRETATION: An orthopantogram was taken and supplemented with a full series of dental radiographs. There are multiple missing teeth. There is  supra-eruption and drifting of the unopposed teeth into the edentulous areas. There is moderate to severe bone loss noted. There is incipient periapical pathology associated with tooth #26. There is atrophy of the edentulous alveolar ridges noted.  ASSESSMENTS: 1. Nasopharyngeal cancer 2. Pre-chemoradiation therapy dental protocol 3. Chronic periodontitis with bone loss  4. Gingival recession 5. Accretions 6. Tooth mobility 7. Multiple flexure lesions 8. Maxillary and irregular incisal attrition 9. Multiple missing teeth 10. Supra-eruption and drifting of the unopposed teeth into the edentulous areas 11. Ill fitting Mandibular cast partial denture 12. Malocclusion 13. Atrophy of the edentulous alveolar ridges  PLAN/RECOMMENDATIONS: 1. I discussed the risks, benefits, and complications of various treatment options with the patient in relationship to his medical and dental conditions, anticipated chemoradiation therapy, and chemoradiation therapy side effects to include xerostomia, radiation caries, trismus, mucositis, taste changes, gum and jawbone changes, and risk for infection and osteoradionecrosis. We discussed various treatment options to include no treatment, total and subtotal extractions with alveoloplasty, pre-prosthetic surgery as indicated, periodontal therapy,  dental restorations, root canal therapy, crown and bridge therapy, implant therapy, and replacement of missing teeth as indicated. The patient currently wishes to proceed with extraction of remaining teeth with alveoloplasty in the operating room with general anesthesia. This has been scheduled for 12/06/2014 at 7:30 AM at [facility]. This was scheduled after the PET scan on Wednesday and the MRI on Thursday per Dr. Valere Dross. The patient will then follow up with the dentist of his choice for fabrication of upper lower complete dentures after adequate healing and approximately 3 months after the last radiation therapy has  been given.  The patient may consider evaluation by a prosthodontist for implants and denture fabrication at that time.  2. Discussion of findings with medical team and coordination of future medical and dental care as needed.  I spent in excess of  120 minutes during the conduct of this consultation and >50% of this time involved direct face-to-face encounter for counseling and/or coordination of the patient's care.  Lenn Cal, DDS

## 2014-11-29 ENCOUNTER — Telehealth: Payer: Self-pay | Admitting: *Deleted

## 2014-12-02 ENCOUNTER — Other Ambulatory Visit (HOSPITAL_COMMUNITY): Payer: Self-pay | Admitting: Dentistry

## 2014-12-02 ENCOUNTER — Ambulatory Visit: Payer: Medicare Other

## 2014-12-03 ENCOUNTER — Encounter (HOSPITAL_COMMUNITY): Payer: Self-pay

## 2014-12-03 ENCOUNTER — Encounter (HOSPITAL_COMMUNITY)
Admission: RE | Admit: 2014-12-03 | Discharge: 2014-12-03 | Disposition: A | Discharge status: Home / Self Care | Payer: Medicare Other | Source: Ambulatory Visit | Attending: Dentistry | Admitting: Dentistry

## 2014-12-03 ENCOUNTER — Encounter: Payer: Medicare Other | Admitting: Nutrition

## 2014-12-03 DIAGNOSIS — E785 Hyperlipidemia, unspecified: Secondary | ICD-10-CM | POA: Diagnosis not present

## 2014-12-03 DIAGNOSIS — I10 Essential (primary) hypertension: Secondary | ICD-10-CM | POA: Diagnosis not present

## 2014-12-03 DIAGNOSIS — C110 Malignant neoplasm of superior wall of nasopharynx: Secondary | ICD-10-CM | POA: Diagnosis not present

## 2014-12-03 DIAGNOSIS — F40240 Claustrophobia: Secondary | ICD-10-CM | POA: Diagnosis not present

## 2014-12-03 DIAGNOSIS — Z87891 Personal history of nicotine dependence: Secondary | ICD-10-CM | POA: Diagnosis not present

## 2014-12-03 DIAGNOSIS — E119 Type 2 diabetes mellitus without complications: Secondary | ICD-10-CM | POA: Diagnosis not present

## 2014-12-03 DIAGNOSIS — K088 Other specified disorders of teeth and supporting structures: Secondary | ICD-10-CM | POA: Diagnosis not present

## 2014-12-03 DIAGNOSIS — K0530 Chronic periodontitis, unspecified: Secondary | ICD-10-CM | POA: Diagnosis not present

## 2014-12-03 HISTORY — DX: Anxiety disorder, unspecified: F41.9

## 2014-12-03 HISTORY — DX: Chronic kidney disease, unspecified: N18.9

## 2014-12-03 HISTORY — DX: Unspecified osteoarthritis, unspecified site: M19.90

## 2014-12-03 HISTORY — DX: Gout, unspecified: M10.9

## 2014-12-03 LAB — GLUCOSE, CAPILLARY: GLUCOSE-CAPILLARY: 200 mg/dL — AB (ref 65–99)

## 2014-12-03 NOTE — Pre-Procedure Instructions (Signed)
    Adam Yang  12/03/2014        Your procedure is scheduled on Friday, May 27.  Report to [facility] Admitting at 5:30 A.M.   Call this number if you have problems the morning of surgery [phone]               For any other questions, please call [phone], Monday - Friday 8 AM - 4 PM.   Remember:  Do not eat food or drink liquids after midnight Thursday, May 26.  Take these medicines the morning of surgery with A SIP OF WATER :amLODipine (NORVASC).                  May take if needed:  LORazepam (ATIVAN).                  Stop taking :diclofenac (VOLTAREN), CENTRUM SILVER, FISH OIL.  Do not take any Aspirin, Aspirin products or Aleve (Naproxen), Advil (Ibuprifen).   Do not wear jewelry, make-up or nail polish.  Do not wear lotions, powders, or perfumes.     Men may shave face and neck.  Do not bring valuables to the hospital.  [facility] is not responsible for any belongings or valuables.  Contacts, dentures or bridgework may not be worn into surgery.  Leave your suitcase in the car.  After surgery it may be brought to your room.  For patients admitted to the hospital, discharge time will be determined by your treatment team.  Patients discharged the day of surgery will not be allowed to drive home.   Name and phone number of your driver:   -  Special instructions:  Review  [facility] - Preparing For Surgery.  Please read over the following fact sheets that you were given. Pain Booklet, Coughing and Deep Breathing and Surgical Site Infection Prevention, How to Manage Your Diabetes Before and After Surgery, What Do I Do About My Diabetes Medications?

## 2014-12-03 NOTE — Progress Notes (Signed)
Mr Adam Yang denies chest pain or shortness of breath.  Mr Adam Yang reported that he had an A1C drawn in Feb.  Patient  States CBG usually runs 70- 80.  Dr Harrington Challenger, patient's PCR manages his diabetes.  Patient and his wife states that patient has had some drops in blood sugar, I can tell and go ahead and eat something.  Patient takes Glimepiride with Supper at 1900.  I informed patient and wife that we ask that patient not take a dose after lunch/  Patient and wife are concerned that CBG will be too high and if he takes it early , with lunch, " it would definitely drop".  I paged diabetic coordinator, patient could not wait and left after blood draw.

## 2014-12-04 ENCOUNTER — Telehealth: Payer: Self-pay | Admitting: *Deleted

## 2014-12-04 ENCOUNTER — Ambulatory Visit (HOSPITAL_COMMUNITY)
Admission: RE | Admit: 2014-12-04 | Discharge: 2014-12-04 | Disposition: A | Discharge status: Home / Self Care | Payer: Medicare Other | Source: Ambulatory Visit | Attending: Hematology and Oncology | Admitting: Hematology and Oncology

## 2014-12-04 DIAGNOSIS — C110 Malignant neoplasm of superior wall of nasopharynx: Secondary | ICD-10-CM | POA: Diagnosis not present

## 2014-12-04 DIAGNOSIS — C119 Malignant neoplasm of nasopharynx, unspecified: Secondary | ICD-10-CM

## 2014-12-04 LAB — GLUCOSE, CAPILLARY: Glucose-Capillary: 154 mg/dL — ABNORMAL HIGH (ref 65–99)

## 2014-12-04 LAB — HEMOGLOBIN A1C
Hgb A1c MFr Bld: 6.1 % — ABNORMAL HIGH (ref 4.8–5.6)
Mean Plasma Glucose: 128 mg/dL

## 2014-12-04 MED ORDER — FLUDEOXYGLUCOSE F - 18 (FDG) INJECTION
8.9400 | Freq: Once | INTRAVENOUS | Status: AC | PRN
  Administered 2014-12-04: 8.94 via INTRAVENOUS

## 2014-12-04 NOTE — Telephone Encounter (Signed)
Oncology Nurse Navigator Documentation  Oncology Nurse Navigator Flowsheets 11/27/2014 11/28/2014 11/28/2014 11/28/2014 11/28/2014 11/28/2014 12/04/2014  Referral date to RadOnc/MedOnc - - - - - - -  Navigator Encounter Type Telephone Telephone Telephone Other Telephone Telephone Telephone   Patient called with request to be notified when PET results available.  I relayed request to Dr. Alvy Bimler.  I confirmed with him a 12:30 arrival for [facility] next Wednesday.  He understands he is to arrive in Radiation Waiting, will be seen by nutrition, PT lymphedema and SW.  Patient Visit Type - - - - - - -  Barriers/Navigation Needs - - - - - - -  Interventions - - - - - - -  Time Spent with Patient 15 15 15 30 15 15 15     Gayleen Orem, RN, BSN, [place] at [place] [phone]

## 2014-12-04 NOTE — Telephone Encounter (Signed)
Oncology Nurse Navigator Documentation  Oncology Nurse Navigator Flowsheets 11/28/2014 11/28/2014 11/28/2014 11/28/2014 11/28/2014 12/04/2014 12/04/2014  Referral date to RadOnc/MedOnc - - - - - - -  Navigator Encounter Type Telephone Telephone Other Telephone Telephone Telephone Telephone Per Dr. Alvy Bimler, called patient to report results of PET.  He expressed relief, appreciation for call. I confirmed his understanding of CT SIM scheduled for 1000 12/12/14.  Patient Visit Type - - - - - - -  Barriers/Navigation Needs - - - - - - -  Interventions - - - - - - -  Time Spent with Patient 15 15 30 15 15 15  15

## 2014-12-04 NOTE — Progress Notes (Signed)
Anesthesia Chart Review:  Patient is a 69 year old male scheduled for multiple teeth extraction with alveoloplasty on 12/06/14 by Dr. Enrique Sack. SPECIAL NEEDS: NASAL TUBE. (Note: He has a left nasopharyngeal soft tissue mass.  Biopsy of left neck mass showed SCC. 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 PCP is Dr. Melinda Crutch, HEM-ONC is Dr. Alvy Bimler, RAD-ONC is Dr. Valere Dross, ENT is Dr. Redmond Baseman.    Meds include amlodipine, Lipitor, Amaryl, Ativan, Cozaar, Fish oil.  MRI of the brain is scheduled at [facility] for 12/05/14.  12/04/14 PET scan: 1. Left posterior nasopharyngeal mass, maximum standard uptake value 15.8. 2. Bilateral station 2 pathologic adenopathy is hypermetabolic as noted above. 3. Chronic paranasal sinusitis with subtotal left mastoid effusion. 4. Regional upper abdominal lymph nodes are not hypermetabolic and likely represent reactive lymph nodes.  11/21/14 CT neck: IMPRESSION: 1. Left nasopharyngeal soft tissue mass with left > right level 2B and level 5 ex-nodal metastatic disease. Poorly marginated primary lesion measuring up to 30-35 mm. Ex-nodal disease encompassing 54 mm on the left and 34 mm on the right. 2. Top differential considerations are primary nasopharyngeal carcinoma versus pharyngeal squamous cell carcinoma. No skullbase invasion is evident by CT. 3. No distant metastatic disease identified in the upper chest. 4. Left mastoid effusion. Paranasal sinus inflammation.  Labs done on 11/27/14 noted. Cr 1.4. Glucose 129, A1C 6.1.  CBC WNL.  For unclear reasons, EKG was not done at PAT although his PAT RN did request records from Dr. Harrington Challenger' office.  There are no prior EKGs in Epic or Muse.  If he has not had an EKG done within the past year, one will need to be done  pre-operatively.  George Hugh [facility] Short Stay Center/Anesthesiology Phone [phone] 12/04/2014 11:03 AM

## 2014-12-05 ENCOUNTER — Ambulatory Visit (HOSPITAL_COMMUNITY)
Admission: RE | Admit: 2014-12-05 | Discharge: 2014-12-05 | Disposition: A | Discharge status: Home / Self Care | Payer: Medicare Other | Source: Ambulatory Visit | Attending: Hematology and Oncology | Admitting: Hematology and Oncology

## 2014-12-05 ENCOUNTER — Ambulatory Visit: Payer: Medicare Other | Admitting: Physical Therapy

## 2014-12-05 DIAGNOSIS — C110 Malignant neoplasm of superior wall of nasopharynx: Secondary | ICD-10-CM | POA: Diagnosis not present

## 2014-12-05 DIAGNOSIS — C119 Malignant neoplasm of nasopharynx, unspecified: Secondary | ICD-10-CM

## 2014-12-05 MED ORDER — GADOBENATE DIMEGLUMINE 529 MG/ML IV SOLN
15.0000 mL | Freq: Once | INTRAVENOUS | Status: AC | PRN
  Administered 2014-12-05: 15 mL via INTRAVENOUS

## 2014-12-05 MED ORDER — CEFAZOLIN SODIUM-DEXTROSE 2-3 GM-% IV SOLR
2.0000 g | INTRAVENOUS | Status: AC
  Administered 2014-12-06: 2 g via INTRAVENOUS
  Filled 2014-12-05: qty 50

## 2014-12-05 NOTE — Progress Notes (Signed)
Call to [facility] Dr. Alan Ripper office, requested last ekg & last ov.

## 2014-12-06 ENCOUNTER — Encounter (HOSPITAL_COMMUNITY): Payer: Self-pay | Admitting: General Practice

## 2014-12-06 ENCOUNTER — Ambulatory Visit (HOSPITAL_COMMUNITY): Payer: Medicare Other | Admitting: Vascular Surgery

## 2014-12-06 ENCOUNTER — Encounter (HOSPITAL_COMMUNITY)
Admission: RE | Disposition: A | Discharge status: Home / Self Care | Payer: Self-pay | Source: Ambulatory Visit | Attending: Dentistry

## 2014-12-06 ENCOUNTER — Ambulatory Visit (HOSPITAL_COMMUNITY)
Admission: RE | Admit: 2014-12-06 | Discharge: 2014-12-06 | Disposition: A | Discharge status: Home / Self Care | Payer: Medicare Other | Source: Ambulatory Visit | Attending: Dentistry | Admitting: Dentistry

## 2014-12-06 ENCOUNTER — Ambulatory Visit (HOSPITAL_COMMUNITY): Payer: Medicare Other | Admitting: Anesthesiology

## 2014-12-06 DIAGNOSIS — E119 Type 2 diabetes mellitus without complications: Secondary | ICD-10-CM | POA: Insufficient documentation

## 2014-12-06 DIAGNOSIS — C110 Malignant neoplasm of superior wall of nasopharynx: Secondary | ICD-10-CM | POA: Diagnosis not present

## 2014-12-06 DIAGNOSIS — K088 Other specified disorders of teeth and supporting structures: Secondary | ICD-10-CM | POA: Insufficient documentation

## 2014-12-06 DIAGNOSIS — E785 Hyperlipidemia, unspecified: Secondary | ICD-10-CM | POA: Insufficient documentation

## 2014-12-06 DIAGNOSIS — K0530 Chronic periodontitis, unspecified: Secondary | ICD-10-CM

## 2014-12-06 DIAGNOSIS — Z87891 Personal history of nicotine dependence: Secondary | ICD-10-CM | POA: Insufficient documentation

## 2014-12-06 DIAGNOSIS — K031 Abrasion of teeth: Secondary | ICD-10-CM

## 2014-12-06 DIAGNOSIS — C119 Malignant neoplasm of nasopharynx, unspecified: Secondary | ICD-10-CM

## 2014-12-06 DIAGNOSIS — I10 Essential (primary) hypertension: Secondary | ICD-10-CM | POA: Insufficient documentation

## 2014-12-06 DIAGNOSIS — F40240 Claustrophobia: Secondary | ICD-10-CM | POA: Insufficient documentation

## 2014-12-06 HISTORY — PX: MULTIPLE EXTRACTIONS WITH ALVEOLOPLASTY: SHX5342

## 2014-12-06 LAB — GLUCOSE, CAPILLARY
Glucose-Capillary: 128 mg/dL — ABNORMAL HIGH (ref 65–99)
Glucose-Capillary: 172 mg/dL — ABNORMAL HIGH (ref 65–99)

## 2014-12-06 SURGERY — MULTIPLE EXTRACTION WITH ALVEOLOPLASTY
Anesthesia: General | Site: Mouth

## 2014-12-06 MED ORDER — OXYMETAZOLINE HCL 0.05 % NA SOLN
NASAL | Status: DC | PRN
  Administered 2014-12-06: 1 via NASAL

## 2014-12-06 MED ORDER — GLYCOPYRROLATE 0.2 MG/ML IJ SOLN
INTRAMUSCULAR | Status: AC
  Filled 2014-12-06: qty 3

## 2014-12-06 MED ORDER — OXYMETAZOLINE HCL 0.05 % NA SOLN
NASAL | Status: AC
  Filled 2014-12-06: qty 15

## 2014-12-06 MED ORDER — PROMETHAZINE HCL 25 MG/ML IJ SOLN: 6.2500 mg | INTRAMUSCULAR | Status: DC | PRN

## 2014-12-06 MED ORDER — NEOSTIGMINE METHYLSULFATE 10 MG/10ML IV SOLN
INTRAVENOUS | Status: AC
  Filled 2014-12-06: qty 1

## 2014-12-06 MED ORDER — LACTATED RINGERS IV SOLN: INTRAVENOUS | Status: DC

## 2014-12-06 MED ORDER — ARTIFICIAL TEARS OP OINT
TOPICAL_OINTMENT | OPHTHALMIC | Status: DC | PRN
  Administered 2014-12-06: 1 via OPHTHALMIC

## 2014-12-06 MED ORDER — MIDAZOLAM HCL 2 MG/2ML IJ SOLN
INTRAMUSCULAR | Status: AC
  Filled 2014-12-06: qty 2

## 2014-12-06 MED ORDER — DEXAMETHASONE SODIUM PHOSPHATE 4 MG/ML IJ SOLN
INTRAMUSCULAR | Status: DC | PRN
  Administered 2014-12-06: 4 mg via INTRAVENOUS

## 2014-12-06 MED ORDER — ONDANSETRON HCL 4 MG/2ML IJ SOLN
INTRAMUSCULAR | Status: AC
  Filled 2014-12-06: qty 2

## 2014-12-06 MED ORDER — BUPIVACAINE-EPINEPHRINE (PF) 0.5% -1:200000 IJ SOLN
INTRAMUSCULAR | Status: AC
  Filled 2014-12-06: qty 3.6

## 2014-12-06 MED ORDER — LIDOCAINE HCL (CARDIAC) 20 MG/ML IV SOLN
INTRAVENOUS | Status: AC
  Filled 2014-12-06: qty 5

## 2014-12-06 MED ORDER — ROCURONIUM BROMIDE 50 MG/5ML IV SOLN
INTRAVENOUS | Status: AC
  Filled 2014-12-06: qty 1

## 2014-12-06 MED ORDER — LACTATED RINGERS IV SOLN
INTRAVENOUS | Status: DC | PRN
  Administered 2014-12-06 (×2): via INTRAVENOUS

## 2014-12-06 MED ORDER — PROPOFOL 10 MG/ML IV BOLUS
INTRAVENOUS | Status: DC | PRN
  Administered 2014-12-06: 150 mg via INTRAVENOUS

## 2014-12-06 MED ORDER — 0.9 % SODIUM CHLORIDE (POUR BTL) OPTIME
TOPICAL | Status: DC | PRN
  Administered 2014-12-06: 1000 mL

## 2014-12-06 MED ORDER — NEOSTIGMINE METHYLSULFATE 10 MG/10ML IV SOLN
INTRAVENOUS | Status: DC | PRN
  Administered 2014-12-06: 4 mg via INTRAVENOUS

## 2014-12-06 MED ORDER — FENTANYL CITRATE (PF) 250 MCG/5ML IJ SOLN
INTRAMUSCULAR | Status: AC
  Filled 2014-12-06: qty 5

## 2014-12-06 MED ORDER — BUPIVACAINE-EPINEPHRINE 0.5% -1:200000 IJ SOLN
INTRAMUSCULAR | Status: DC | PRN
  Administered 2014-12-06: 3.6 mL

## 2014-12-06 MED ORDER — HYDROMORPHONE HCL 1 MG/ML IJ SOLN: 0.2500 mg | INTRAMUSCULAR | Status: DC | PRN

## 2014-12-06 MED ORDER — STERILE WATER FOR IRRIGATION IR SOLN
Status: DC | PRN
  Administered 2014-12-06: 700 mL

## 2014-12-06 MED ORDER — HEMOSTATIC AGENTS (NO CHARGE) OPTIME
TOPICAL | Status: DC | PRN
  Administered 2014-12-06: 1 via TOPICAL

## 2014-12-06 MED ORDER — FENTANYL CITRATE (PF) 100 MCG/2ML IJ SOLN
INTRAMUSCULAR | Status: DC | PRN
  Administered 2014-12-06: 100 ug via INTRAVENOUS
  Administered 2014-12-06: 50 ug via INTRAVENOUS

## 2014-12-06 MED ORDER — LIDOCAINE HCL (CARDIAC) 20 MG/ML IV SOLN
INTRAVENOUS | Status: DC | PRN
  Administered 2014-12-06: 50 mg via INTRAVENOUS

## 2014-12-06 MED ORDER — OXYCODONE-ACETAMINOPHEN 5-325 MG PO TABS: ORAL_TABLET | ORAL | Status: DC

## 2014-12-06 MED ORDER — ISOPROPYL ALCOHOL 70 % SOLN
Status: DC | PRN
  Administered 2014-12-06: 1 via TOPICAL

## 2014-12-06 MED ORDER — ONDANSETRON HCL 4 MG/2ML IJ SOLN
INTRAMUSCULAR | Status: DC | PRN
  Administered 2014-12-06: 4 mg via INTRAVENOUS

## 2014-12-06 MED ORDER — OXYCODONE-ACETAMINOPHEN 5-325 MG PO TABS: 1.0000 | ORAL_TABLET | ORAL | Status: DC | PRN

## 2014-12-06 MED ORDER — PROPOFOL 10 MG/ML IV BOLUS
INTRAVENOUS | Status: AC
  Filled 2014-12-06: qty 20

## 2014-12-06 MED ORDER — EPHEDRINE SULFATE 50 MG/ML IJ SOLN
INTRAMUSCULAR | Status: AC
  Filled 2014-12-06: qty 1

## 2014-12-06 MED ORDER — GLYCOPYRROLATE 0.2 MG/ML IJ SOLN
INTRAMUSCULAR | Status: DC | PRN
  Administered 2014-12-06: 0.6 mg via INTRAVENOUS

## 2014-12-06 MED ORDER — SUCCINYLCHOLINE CHLORIDE 20 MG/ML IJ SOLN
INTRAMUSCULAR | Status: AC
  Filled 2014-12-06: qty 1

## 2014-12-06 MED ORDER — SODIUM CHLORIDE 0.9 % IJ SOLN
INTRAMUSCULAR | Status: AC
  Filled 2014-12-06: qty 10

## 2014-12-06 MED ORDER — EPHEDRINE SULFATE 50 MG/ML IJ SOLN
INTRAMUSCULAR | Status: DC | PRN
  Administered 2014-12-06 (×2): 5 mg via INTRAVENOUS
  Administered 2014-12-06: 10 mg via INTRAVENOUS
  Administered 2014-12-06: 5 mg via INTRAVENOUS

## 2014-12-06 MED ORDER — PHENYLEPHRINE 40 MCG/ML (10ML) SYRINGE FOR IV PUSH (FOR BLOOD PRESSURE SUPPORT)
PREFILLED_SYRINGE | INTRAVENOUS | Status: AC
  Filled 2014-12-06: qty 10

## 2014-12-06 MED ORDER — LIDOCAINE-EPINEPHRINE 2 %-1:100000 IJ SOLN
INTRAMUSCULAR | Status: AC
  Filled 2014-12-06: qty 10.2

## 2014-12-06 MED ORDER — ROCURONIUM BROMIDE 100 MG/10ML IV SOLN
INTRAVENOUS | Status: DC | PRN
  Administered 2014-12-06: 50 mg via INTRAVENOUS

## 2014-12-06 MED ORDER — PHENYLEPHRINE HCL 10 MG/ML IJ SOLN
INTRAMUSCULAR | Status: DC | PRN
  Administered 2014-12-06 (×4): 80 ug via INTRAVENOUS

## 2014-12-06 MED ORDER — LIDOCAINE-EPINEPHRINE 2 %-1:100000 IJ SOLN
INTRAMUSCULAR | Status: DC | PRN
  Administered 2014-12-06: 8.5 mL via INTRADERMAL

## 2014-12-06 MED ORDER — ARTIFICIAL TEARS OP OINT
TOPICAL_OINTMENT | OPHTHALMIC | Status: AC
  Filled 2014-12-06: qty 3.5

## 2014-12-06 MED ORDER — MIDAZOLAM HCL 5 MG/5ML IJ SOLN
INTRAMUSCULAR | Status: DC | PRN
  Administered 2014-12-06: 2 mg via INTRAVENOUS

## 2014-12-06 MED ORDER — OXYMETAZOLINE HCL 0.05 % NA SOLN
NASAL | Status: DC | PRN
  Administered 2014-12-06 (×2): 2 via NASAL

## 2014-12-06 SURGICAL SUPPLY — 34 items
ALCOHOL 70% 16 OZ (MISCELLANEOUS) ×2 IMPLANT
ATTRACTOMAT 16X20 MAGNETIC DRP (DRAPES) ×2 IMPLANT
BLADE SURG 15 STRL LF DISP TIS (BLADE) ×2 IMPLANT
BLADE SURG 15 STRL SS (BLADE) ×2
CATH ROBINSON RED A/P 14FR (CATHETERS) ×2 IMPLANT
COVER SURGICAL LIGHT HANDLE (MISCELLANEOUS) ×2 IMPLANT
GAUZE PACKING FOLDED 2  STR (GAUZE/BANDAGES/DRESSINGS) ×1
GAUZE PACKING FOLDED 2 STR (GAUZE/BANDAGES/DRESSINGS) ×1 IMPLANT
GAUZE SPONGE 4X4 16PLY XRAY LF (GAUZE/BANDAGES/DRESSINGS) ×2 IMPLANT
GLOVE BIOGEL PI IND STRL 6 (GLOVE) ×1 IMPLANT
GLOVE BIOGEL PI INDICATOR 6 (GLOVE) ×1
GLOVE SURG ORTHO 8.0 STRL STRW (GLOVE) ×2 IMPLANT
GLOVE SURG SS PI 6.0 STRL IVOR (GLOVE) ×2 IMPLANT
GOWN STRL REUS W/ TWL LRG LVL3 (GOWN DISPOSABLE) ×1 IMPLANT
GOWN STRL REUS W/TWL 2XL LVL3 (GOWN DISPOSABLE) ×2 IMPLANT
GOWN STRL REUS W/TWL LRG LVL3 (GOWN DISPOSABLE) ×1
HEMOSTAT SURGICEL 2X14 (HEMOSTASIS) IMPLANT
KIT BASIN OR (CUSTOM PROCEDURE TRAY) ×2 IMPLANT
KIT ROOM TURNOVER OR (KITS) ×2 IMPLANT
MANIFOLD NEPTUNE WASTE (CANNULA) ×2 IMPLANT
NEEDLE BLUNT 16X1.5 OR ONLY (NEEDLE) ×2 IMPLANT
NS IRRIG 1000ML POUR BTL (IV SOLUTION) ×2 IMPLANT
PACK EENT II TURBAN DRAPE (CUSTOM PROCEDURE TRAY) ×2 IMPLANT
PAD ARMBOARD 7.5X6 YLW CONV (MISCELLANEOUS) ×2 IMPLANT
SPONGE GAUZE 4X4 12PLY STER LF (GAUZE/BANDAGES/DRESSINGS) ×2 IMPLANT
SPONGE SURGIFOAM ABS GEL 100 (HEMOSTASIS) ×2 IMPLANT
SPONGE SURGIFOAM ABS GEL 12-7 (HEMOSTASIS) IMPLANT
SPONGE SURGIFOAM ABS GEL SZ50 (HEMOSTASIS) IMPLANT
SUCTION FRAZIER TIP 10 FR DISP (SUCTIONS) ×2 IMPLANT
SUT CHROMIC 3 0 PS 2 (SUTURE) ×8 IMPLANT
SYR 50ML SLIP (SYRINGE) ×2 IMPLANT
TOWEL OR 17X26 10 PK STRL BLUE (TOWEL DISPOSABLE) ×2 IMPLANT
TUBE CONNECTING 12X1/4 (SUCTIONS) ×2 IMPLANT
YANKAUER SUCT BULB TIP NO VENT (SUCTIONS) ×2 IMPLANT

## 2014-12-06 NOTE — Progress Notes (Signed)
PRE-OPERATIVE NOTE:  12/06/2014   Kailash Kirchman [id number]  VITALS: BP 157/82 mmHg  Pulse 76  Resp 18  Ht 5\' 8"  (1.727 m)  Wt 175 lb (79.379 kg)  BMI 26.61 kg/m2  SpO2 97%  Lab Results  Component Value Date   WBC 5.5 11/27/2014   HGB 14.0 11/27/2014   HCT 41.4 11/27/2014   MCV 85.0 11/27/2014   PLT 190 11/27/2014   BMET    Component Value Date/Time   NA 142 11/27/2014 0941   K 4.6 11/27/2014 0941   CO2 23 11/27/2014 0941   GLUCOSE 129 11/27/2014 0941   BUN 29.5* 11/27/2014 0941   CREATININE 1.4* 11/27/2014 0941   CALCIUM 9.2 11/27/2014 0941    No results found for: INR, PROTIME No results found for: PTT   Chirstopher Puskarich presents for extraction remaining teeth with alveoloplasty and pre-prosthetic surgery as indicated in the operative room and general anesthesia.    SUBJECTIVE: The patient denies any acute medical or dental changes and agrees to proceed with treatment as planned.  EXAM: No sign of acute dental changes.  ASSESSMENT: Patient is affected by chronic periodontitis, accretions, and multiple loose teeth.  PLAN: Patient agrees to proceed with treatment as planned in the operating room as previously discussed and accepts the risks, benefits, and complications of the proposed treatment. Patient is aware of the risk for bleeding, bruising, swelling, infection, pain, nerve damage, soft tissue damage, sinus involvement, root tip fracture, mandible fracture, and the risks of complications associated with the anesthesia. Patient also is aware of the potential for other complications not mentioned above.   Lenn Cal, DDS

## 2014-12-06 NOTE — Anesthesia Postprocedure Evaluation (Signed)
  Anesthesia Post-op Note  Patient: Adam Yang  Procedure(s) Performed: Procedure(s): Extraction of tooth #'s 2,4,5,6,7,8,9,10,11,12,13,14,15, 20,22,26,27,28,29 with alveoloplasty. (N/A)  Patient Location: PACU  Anesthesia Type:General  Level of Consciousness: awake  Airway and Oxygen Therapy: Patient Spontanous Breathing  Post-op Pain: mild  Post-op Assessment: Post-op Vital signs reviewed  Post-op Vital Signs: Reviewed  Last Vitals:  Filed Vitals:   12/06/14 1043  BP:   Pulse:   Temp: 36.6 C  Resp:     Complications: No apparent anesthesia complications

## 2014-12-06 NOTE — H&P (Signed)
12/06/2014  Patient:            Adam Yang Date of Birth:  Feb 08, 1946 MRN:                [id number]   BP 157/82 mmHg  Pulse 76  Resp 18  Ht 5\' 8"  (1.727 m)  Wt 175 lb (79.379 kg)  BMI 26.61 kg/m2  SpO2 97%   [name] is a 69 year old male she diagnosed with nasopharyngeal carcinoma. Patient with anticipated chemoradiation therapy. Patient was seen as part of a medically necessary pre-chemoradiation therapy dental protocol examination. Patient is planned for extraction remaining teeth with alveoloplasty and pre-prosthetic surgery as indicated General anesthesia. Patient denies any acute medical or dental changes. Please use note from Dr. Alvy Bimler to act as H&P for the dental operating room procedure.  Lenn Cal, [place] CONSULT NOTE  Patient Care Team: Lona Kettle, MD as PCP - General (Family Medicine)  CHIEF COMPLAINTS/PURPOSE OF CONSULTATION:  Squamous cell carcinoma involving nasopharynx and bilateral cervical lymphadenopathy, suspect metastatic nasopharyngeal carcinoma  HISTORY OF PRESENTING ILLNESS:  Adam Yang 69 y.o. male is here because of newly diagnosed squamous cell carcinoma. According to the patient, the first initial presentation was due to self palpated bilateral neck lumps. He stated that he had felt them for over 6-8 months. Over the last 6 weeks, the left neck lymphadenopathy has increased in size significantly. He saw his primary care doctor and was subsequently referred to see ENT he denies any hearing deficit, difficulties with chewing food, swallowing difficulties, painful swallowing, changes in the quality of his voice or abnormal weight loss. He complained of persistent nasal drainage but denies epistaxis. At the ENT office, he underwent fine-needle aspirate of the left neck mass. Summary of oncologic history is as follows:   Nasopharyngeal cancer   11/13/2014 Procedure FNA of neck mass showed squamous cell  carcinoma [id number]   11/21/2014 Imaging CT neck showed nasopharyngeal mass and bilateral cervical lymphadenopathy   MEDICAL HISTORY:  Past Medical History  Diagnosis Date  . Hyperlipidemia   . Diabetes mellitus without complication   . Hypertension     SURGICAL HISTORY: Past Surgical History  Procedure Laterality Date  . Ln biopsy    . Colonoscopy    . Knee surgery Left     SOCIAL HISTORY: History   Social History  . Marital Status: Unknown    Spouse Name: N/A  . Number of Children: N/A  . Years of Education: N/A   Occupational History  . Not on file.   Social History Main Topics  . Smoking status: Former Smoker -- 1.00 packs/day for 25 years    Quit date: 07/12/1998  . Smokeless tobacco: Never Used  . Alcohol Use: No  . Drug Use: No  . Sexual Activity: Not on file   Other Topics Concern  . Not on file   Social History Narrative  . No narrative on file    FAMILY HISTORY: Family History  Problem Relation Age of Onset  . Cancer Mother     throat ca  . Cancer Father     lung ca  . Cancer Brother     throat ca    ALLERGIES: has No Known Allergies.  MEDICATIONS:  Current Outpatient Prescriptions  Medication Sig Dispense Refill  . amLODipine (NORVASC) 5 MG tablet Take 5 mg by mouth daily.    Marland Kitchen atorvastatin (LIPITOR) 20 MG tablet Take 20 mg by mouth daily.    Marland Kitchen  diclofenac (VOLTAREN) 75 MG EC tablet Take 75 mg by mouth as needed.    Marland Kitchen glimepiride (AMARYL) 2 MG tablet Take 2 mg by mouth daily.  4  . losartan (COZAAR) 50 MG tablet Take 50 mg by mouth daily.  4  . Multiple Vitamins-Minerals (CENTRUM SILVER PO) Take by mouth daily.    . Omega-3 Fatty Acids (FISH OIL PO) Take by mouth daily.    Marland Kitchen LORazepam (ATIVAN) 1 MG tablet Take 1 tablet (1 mg total) by mouth 2 (two) times daily as needed for anxiety.  10 tablet 0   No current facility-administered medications for this visit.    REVIEW OF SYSTEMS:  Constitutional: Denies fevers, chills or abnormal night sweats Eyes: Denies blurriness of vision, double vision or watery eyes Ears, nose, mouth, throat, and face: Denies mucositis or sore throat Respiratory: Denies cough, dyspnea or wheezes Cardiovascular: Denies palpitation, chest discomfort or lower extremity swelling Gastrointestinal: Denies nausea, heartburn or change in bowel habits Skin: Denies abnormal skin rashes Neurological:Denies numbness, tingling or new weaknesses Behavioral/Psych: Mood is stable, no new changes  All other systems were reviewed with the patient and are negative.  PHYSICAL EXAMINATION: ECOG PERFORMANCE STATUS: 0 - Asymptomatic  Filed Vitals:   11/26/14 1535  BP: 153/72  Pulse: 78  Temp: 98.1 F (36.7 C)  Resp: 18   Filed Weights   11/26/14 1535  Weight: 178 lb 1.6 oz (80.786 kg)    GENERAL:alert, no distress and comfortable SKIN: skin color, texture, turgor are normal, no rashes or significant lesions EYES: normal, conjunctiva are pink and non-injected, sclera clear OROPHARYNX:no exudate, no erythema and lips, buccal mucosa, and tongue normal  NECK: supple, thyroid normal size, non-tender, without nodularity LYMPH: Palpable lymphadenopathy in both [place] of the neck. Non-elsewhere.  LUNGS: clear to auscultation and percussion with normal breathing effort HEART: regular rate & rhythm and no murmurs and no lower extremity edema ABDOMEN:abdomen soft, non-tender and normal bowel sounds Musculoskeletal:no cyanosis of digits and no clubbing  PSYCH: alert & oriented x 3 with fluent speech NEURO: no focal motor/sensory deficits  LABORATORY DATA: N/A RADIOGRAPHIC STUDIES: I have personally reviewed the radiological images as listed and agreed with the findings in the report.  Imaging Results    Ct Soft Tissue Neck W  Contrast  11/21/2014 CLINICAL DATA: 69 year old male with bilateral palpable neck lesions discovered 8 months ago. Recently increased size of the left lesion. No associated pain. Initial encounter. EXAM: CT NECK WITH CONTRAST TECHNIQUE: Multidetector CT imaging of the neck was performed using the standard protocol following the bolus administration of intravenous contrast. CONTRAST: 50 mL Omnipaque 300. COMPARISON: None. FINDINGS: Pharynx and larynx: Abnormal mass like soft tissue contour of the left nasopharynx at or just above the fossa of Rosenmuller (series 3, image 15 and coronal image 31). Posterior and lateral margins are indistinct. This encompasses 26 x 35 x 29 mm (AP by transverse by CC). Effacement of the left parapharyngeal fat. The lesion is inseparable from the left longus coli muscle. This is at the level of the pterygoid plates. Below this the palatine tonsils and soft palate appear within normal limits. The hypopharynx, epiglottis and larynx appear within normal limits. The retropharyngeal and sublingual spaces are within normal limits. Salivary glands: Submandibular and parotid glands are within normal limits. Thyroid: Negative. Lymph nodes: Spiculated and conglomerate and left level 2/level 5 nodal mass on the left encompassing 54 x 22 x 40 mm (AP by transverse by CC). See sagittal image 49. The  anterior extent of this is partially cystic or necrotic (series 3, image 27). The caudal posterior aspect corresponds to the marked palpable area of concern. Similarly heterogeneous and spiculated right level 2 B nodal mass measuring 21 x 19 x 34 mm. See sagittal image 20. This corresponds to the marked palpable area of concern. Mild stranding of the left level 5 soft tissues below the conglomerate nodal mass. Small bilateral level 5 nodes are normal by size criteria and morphology. The anterior level IIa nodes bilaterally also appear within normal limits. No level 1 lymphadenopathy.  Mildly asymmetric but normal by size criteria left level IIIb node on series 3, image 47. No level 4 lymphadenopathy. Vascular: Bilateral carotid calcified plaque. Major vascular structures appear to remain patent. The left carotid space an the right IJ are abutted by the conglomerate nodal masses. Limited intracranial: Negative. Visualized orbits: Negative. Mastoids and visualized paranasal sinuses: Widespread paranasal sinus mucosal thickening and opacification. Left mastoid effusion. Both tympanic cavities are clear. Right mastoids are clear. Skeleton: Bone mineralization at the skullbase appears within normal limits. The clivus is within normal limits. Degenerative changes throughout the cervical spine. No acute or suspicious osseous lesion identified. Upper chest: No superior mediastinal lymphadenopathy. Mild dependent atelectasis. No upper lung nodule. No axillary lymphadenopathy. IMPRESSION: 1. Left nasopharyngeal soft tissue mass with left > right level 2B and level 5 ex-nodal metastatic disease. Poorly marginated primary lesion measuring up to 30-35 mm. Ex-nodal disease encompassing 54 mm on the left and 34 mm on the right. 2. Top differential considerations are primary nasopharyngeal carcinoma versus pharyngeal squamous cell carcinoma. No skullbase invasion is evident by CT. 3. No distant metastatic disease identified in the upper chest. 4. Left mastoid effusion. Paranasal sinus inflammation. Electronically Signed By: Genevie Ann M.D. On: 11/21/2014 15:03     ASSESSMENT:  Newly diagnosed squamous cell carcinoma of the Head & Neck  PLAN:  Nasopharyngeal cancer The patient has significant advanced presentation of suspected nasopharyngeal carcinoma. We discussed the importance of staging. Previously, he had mild claustrophobia with MRI. I gave him prescription lorazepam to use. I plan to restage him with MRI along with PET CT scan. His wife indicated the patient have chronic renal  failure in the past. That might limit therapeutic options. I plan to order blood work to be done tomorrow before he sees radiation oncologist. He would need a multidisciplinary team approach and I have sent multiple referrals to see nutritionist, speech & language therapist, dentist, physical therapist, social worker and attend chemotherapy education class. The patient had originally planned to leave Friday evening to spend 3 weeks at his daughter's home in [place]. This would significantly delay the start of treatment and evaluation. I warned him about potential disease progression in the next few weeks and that could potentially lead to irreversible organ damage such as losing eyesight if the tumor starts invading into the orbit, cranial nerve dysfunction or potentially cause severe nosebleeds. He would think about whether he wants to return home early to complete all the necessary workup so that we can start treatment as soon as possible. I will get his case presented at the ENT tumor board tomorrow. The patient may need placement of port and feeding tube in the future but I am holding off putting those orders until results of imaging studies are available.      Orders Placed This Encounter  Procedures  . NM PET Image Initial (PI) Skull Base To Thigh    Standing Status: Future  Number of Occurrences:      Standing Expiration Date: 12/31/2015    Order Specific Question:  Reason for Exam (SYMPTOM OR DIAGNOSIS REQUIRED)    Answer:  staging nasopharyngeal ca    Order Specific Question:  Preferred imaging location?    Answer:  [facility]  . MR Brain W Wo Contrast    Standing Status: Future     Number of Occurrences:      Standing Expiration Date: 12/31/2015    Order Specific Question:  Reason for exam:    Answer:  nasopharyngeal ca staging    Order Specific Question:  Preferred imaging location?    Answer:   [facility]    Order Specific Question:  Does the patient have a pacemaker or implanted devices?    Answer:  No  . CBC with Differential/Platelet    Standing Status: Future     Number of Occurrences:      Standing Expiration Date: 12/31/2015  . Comprehensive metabolic panel    Standing Status: Future     Number of Occurrences:      Standing Expiration Date: 12/31/2015  . Ambulatory Referral to Speech Therapy (specifically to Garald Balding)    Referral Priority:  Routine    Referral Type:  Speech Therapy    Referral Reason:  Specialty Services Required    Requested Specialty:  Speech Pathology    Number of Visits Requested:  1  . Ambulatory Referral to Dentistry (specifically to Dr. Enrique Sack)    Referral Priority:  Routine    Referral Type:  Consultation    Referral Reason:  Specialty Services Required    Requested Specialty:  Dental General Practice    Number of Visits Requested:  1  . Amb Referral to Nutrition and Diabetic Education (specifically to Ernestene Kiel)    Referral Priority:  Routine    Referral Type:  Consultation    Referral Reason:  Specialty Services Required    Number of Visits Requested:  1  . Ambulatory Referral to Social Work    Referral Priority:  Routine    Referral Type:  Consultation    Referral Reason:  Specialty Services Required    Number of Visits Requested:  1  . Ambulatory Referral to Physical Therapy    Referral Priority:  Routine    Referral Type:  Physical Medicine    Referral Reason:  Specialty Services Required    Requested Specialty:  Physical Therapy    Number of Visits Requested:  1    All questions were answered. The patient knows to call the clinic with any problems, questions or concerns. I spent 55 minutes counseling the patient face to face. The total time spent in the appointment was  60 minutes and more than 50% was on counseling.   Pyatt, [place], MD 11/26/2014 4:30 PM

## 2014-12-06 NOTE — Op Note (Signed)
OPERATIVE REPORT  Patient:            Adam Yang Date of Birth:  03/09/1946 MRN:                [id number]   DATE OF PROCEDURE:  12/06/2014  PREOPERATIVE DIAGNOSES: 1. Nasopharyngeal carcinoma 2. Pre-chemoradiation therapy dental protocol 3. Chronic periodontitis 4. Loose teeth 5. Multiple flexure lesions  POSTOPERATIVE DIAGNOSES: 1. Nasopharyngeal carcinoma 2. Pre-chemoradiation therapy dental protocol 3. Chronic periodontitis 4. Loose teeth 5. Multiple flexure lesions  OPERATIONS: 1. Multiple extraction of tooth numbers 2, 4, 5, 6, 7, 8, 9, 10, 11, 12, 13, 14, 15, 20, 22, 26, 27, 28, and 29. 2. 4 Quadrants of alveoloplasty   SURGEON: Lenn Cal, DDS  ASSISTANT: Camie Patience, (dental assistant)  ANESTHESIA: General anesthesia via oral endotracheal tube.  MEDICATIONS: 1. Ancef 2 g IV prior to invasive dental procedures. 2. Local anesthesia with a total utilization of 5 carpules each containing 34 mg of lidocaine with 0.017 mg of epinephrine as well as 2 carpules each containing 9 mg of bupivacaine with 0.009 mg of epinephrine.  SPECIMENS: There are 19 teeth that were discarded.  DRAINS: None  CULTURES: None  COMPLICATIONS: None   ESTIMATED BLOOD LOSS: 100 mLs.  INTRAVENOUS FLUIDS: Lactated ringers solution per the anesthesia team  INDICATIONS: The patient was recently diagnosed with nasopharyngeal carcinoma.  A dental consultation was then requested to evaluate patient as part of a medically necessary pre-chemoradiation therapy dental protocol.  The patient was examined and treatment planned for extraction remaining teeth with alveoloplasty in the operating room with general anesthesia.  This treatment plan was formulated to decrease the risks and complications associated with dental infection from affecting the patient's systemic health and to prevent future complications such as osteoradionecrosis.  OPERATIVE FINDINGS: Patient was examined  operating room number 12.  The teeth were identified for extraction. The patient was noted be affected by chronic periodontitis, loose teeth, and multiple abfraction lesions.   DESCRIPTION OF PROCEDURE: Patient was brought to the main operating room number 12. Patient was then placed in the supine position on the operating table. General anesthesia was then induced per the anesthesia team. The patient was then prepped and draped in the usual manner for dental medicine procedure. A timeout was performed. The patient was identified and procedures were verified. A throat pack was placed at this time. The oral cavity was then thoroughly examined with the findings noted above. The patient was then ready for dental medicine procedure as follows:  Local anesthesia was then administered sequentially with a total utilization of 5 carpules each containing 34 mg of lidocaine with 0.017 mg of epinephrine as well as 2 carpules  each containing 9 mg bupivacaine with 0.009 mg of epinephrine.  The Maxillary left and right quadrants first approached. Anesthesia was then delivered utilizing infiltration with lidocaine with epinephrine. A #15 blade incision was then made from the maxillary right tuberosity and extended to the maxillary left tuberosity.  A  surgical flap was then carefully reflected. The teeth were then subluxated with a series of straight elevators. Tooth numbers 2, 4, 5, 6, 7, 8, 9, 10, 11, 12, 13, 14, and 15 were then removed with a 150 forceps without complications. Alveoloplasty was then performed utilizing a ronguers and bone file. The surgical site was then irrigated with copious amounts of sterile saline. The tissues were approximated and trimmed appropriately. A piece of Surgifoam was placed in the extraction socket of #14 and  15 as well as tooth number 2. The maxillary right surgical site was then closed from the tuberosity and extended to the mesial #8 utilizing 3-0 chromic gut suture in a continuous  interrupted suture technique 1. The maxillary left surgical site was then closed from the maxillary left tuberosity and extended the mesial #9 utilizing 3-0 chromic gut suture in a continuous interrupted suture technique 1. 3 individual interrupted sutures were then placed to further closed surgical site as needed.  At this point time, the mandibular quadrants were approached. The patient was given bilateral inferior alveolar nerve blocks and long buccal nerve blocks utilizing the bupivacaine with epinephrine. Further infiltration was then achieved utilizing the lidocaine with epinephrine. A 15 blade incision was then made from the distal of number 19 and extended to the distal of #30.  A surgical flap was then carefully reflected. The lower teeth were then were then subluxated with a series of straight elevators. Tooth numbers 20, 22, 28, and 29 and coronal aspect of tooth #27 were then removed with a 151 forceps. This left the root in the area #27 remaining. Further bone was then removed around retained root tip and root tip was then elevated out with a root tip pick without complication. Alveoloplasty was then performed utilizing a rongeurs and bone file. The tissues were approximated and trimmed appropriately. The surgical sites were then irrigated with copious amounts of sterile saline. The patient Surgifoam was placed in the extraction socket area #27 and 28. The mandibular left surgical site was then closed from the distal of  19 and extended the mesial of #24 utilizing 3-0 chromic gut suture in a continuous interrupted suture technique 1. The mandibular right surgical site was then closed from the distal of #30 and extended the mesial #25 utilizing 3-0 chromic gut suture in a continuous sharp and suture technique 1. One individual interrupted sutures then placed to further close the surgical site as needed.  At this point time, the entire mouth was irrigated with copious amounts of sterile saline.  The patient was examined for complications, seeing none, the dental medicine procedure was deemed to be complete. The throat pack was removed at this time. An oral airway was then placed at the request of the anesthesia team. A series of 4 x 4 gauze were placed in the mouth to aid hemostasis. The patient was then handed over to the anesthesia team for final disposition. After an appropriate amount of time, the patient was extubated and taken to the postanesthsia care unit in good condition. All counts were correct for the dental medicine procedure.   Lenn Cal, DDS.

## 2014-12-06 NOTE — Discharge Instructions (Signed)

## 2014-12-06 NOTE — Anesthesia Preprocedure Evaluation (Addendum)
Anesthesia Evaluation  Patient identified by MRN, date of birth, ID band Patient awake    History of Anesthesia Complications (+) PONV  Airway Mallampati: II  TM Distance: >3 FB Neck ROM: Full    Dental  (+) Poor Dentition, Loose, Dental Advisory Given   Pulmonary former smoker,  breath sounds clear to auscultation        Cardiovascular hypertension, Rhythm:Regular Rate:Normal     Neuro/Psych    GI/Hepatic negative GI ROS, Neg liver ROS,   Endo/Other  diabetes  Renal/GU Renal disease     Musculoskeletal   Abdominal   Peds  Hematology   Anesthesia Other Findings   Reproductive/Obstetrics                            Anesthesia Physical Anesthesia Plan  ASA: III  Anesthesia Plan: General   Post-op Pain Management:    Induction: Intravenous  Airway Management Planned: Nasal ETT  Additional Equipment:   Intra-op Plan:   Post-operative Plan: Extubation in OR  Informed Consent: I have reviewed the patients History and Physical, chart, labs and discussed the procedure including the risks, benefits and alternatives for the proposed anesthesia with the patient or authorized representative who has indicated his/her understanding and acceptance.   Dental advisory given  Plan Discussed with: CRNA and Anesthesiologist  Anesthesia Plan Comments:         Anesthesia Quick Evaluation

## 2014-12-06 NOTE — Transfer of Care (Signed)
Immediate Anesthesia Transfer of Care Note  Patient: Adam Yang  Procedure(s) Performed: Procedure(s): Extraction of tooth #'s 2,4,5,6,7,8,9,10,11,12,13,14,15, 20,22,26,27,28,29 with alveoloplasty. (N/A)  Patient Location: PACU  Anesthesia Type:General  Level of Consciousness: awake and alert   Airway & Oxygen Therapy: Patient Spontanous Breathing and Patient connected to face mask oxygen  Post-op Assessment: Report given to RN  Post vital signs: Reviewed and stable  Last Vitals:  Filed Vitals:   12/06/14 0602  BP: 157/82  Pulse: 76  Resp: 18    Complications: No apparent anesthesia complications

## 2014-12-06 NOTE — Anesthesia Procedure Notes (Signed)
Procedure Name: Intubation Performed by: Sampson Si E Pre-anesthesia Checklist: Patient identified, Timeout performed, Emergency Drugs available, Suction available and Patient being monitored Patient Re-evaluated:Patient Re-evaluated prior to inductionOxygen Delivery Method: Circle system utilized Preoxygenation: Pre-oxygenation with 100% oxygen Intubation Type: IV induction Ventilation: Mask ventilation without difficulty Laryngoscope Size: Mac and 4 Grade View: Grade I Tube type: Oral Tube size: 7.0 mm Number of attempts: 1 Airway Equipment and Method: Stylet Placement Confirmation: ETT inserted through vocal cords under direct vision,  breath sounds checked- equal and bilateral,  positive ETCO2 and CO2 detector Secured at: 22 cm Tube secured with: Tape Dental Injury: Teeth and Oropharynx as per pre-operative assessment

## 2014-12-07 ENCOUNTER — Encounter (HOSPITAL_COMMUNITY): Payer: Self-pay | Admitting: Dentistry

## 2014-12-09 ENCOUNTER — Other Ambulatory Visit: Payer: Self-pay | Admitting: Hematology and Oncology

## 2014-12-09 DIAGNOSIS — C119 Malignant neoplasm of nasopharynx, unspecified: Secondary | ICD-10-CM

## 2014-12-10 ENCOUNTER — Telehealth: Payer: Self-pay | Admitting: Hematology and Oncology

## 2014-12-10 NOTE — Telephone Encounter (Addendum)
Per 05/30 POF, labs/ov/chemo education schedule, sent msg to add chemo and gave pt schedule at [facility] visit on 06/01.... KJ, also left msg with IR for port, looking at schedule pt has been contacted.Marland KitchenMarland KitchenMarland Kitchen

## 2014-12-11 ENCOUNTER — Ambulatory Visit: Payer: Medicare Other | Attending: Hematology and Oncology | Admitting: Physical Therapy

## 2014-12-11 ENCOUNTER — Encounter: Payer: Self-pay | Admitting: *Deleted

## 2014-12-11 ENCOUNTER — Ambulatory Visit: Payer: Medicare Other | Admitting: Nutrition

## 2014-12-11 ENCOUNTER — Telehealth: Payer: Self-pay | Admitting: Hematology and Oncology

## 2014-12-11 DIAGNOSIS — R1310 Dysphagia, unspecified: Secondary | ICD-10-CM | POA: Diagnosis present

## 2014-12-11 DIAGNOSIS — M436 Torticollis: Secondary | ICD-10-CM | POA: Diagnosis present

## 2014-12-11 NOTE — Progress Notes (Signed)
Patient was seen during and neck clinic.  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  Past medical history includes hyperlipidemia, diabetes, hypertension, and tobacco.  Medications include Lipitor, Amaryl, Ativan, multivitamin, and omega-3 fatty acid.  Labs include BUN 29.5 and creatinine 1.4 on May 18.  Height: 68 inches. Weight: 175.4 pounds. Usual body weight: 175 pounds. BMI: 26.68.  Patient reports he is hungry all the time. His appetite is excellent and he is eating well. Patient denies nutrition impact symptoms except for difficulty chewing secondary to total extractions. Patient disturbed that blood sugars are higher than usual. Reports normally his blood sugars are between 130 and 140. Patient generally eats healthy diet.  Nutrition diagnosis:  Predicted suboptimal energy intake related to diagnosis of nasopharyngeal cancer and associated treatments as evidenced by history or presence of this condition for which research shows an increased incidence of suboptimal energy intake.  Intervention:  Patient educated to consume small frequent meals and snacks to promote weight maintenance. Encouraged patient continue healthy diet as tolerated and modify textures as needed for easier chewing. Briefly educated patient on strategies for using feeding tube. Provided basic education on potential nutrition impact symptoms. Fact sheets were provided.  Questions were answered and teach back method used.  Patient has my contact information.  Monitoring, evaluation, goals: Patient will tolerate adequate calories and protein to minimize weight loss and achieve adequate glycemic control.  Next visit: Monday, June 13 during chemotherapy.  **Disclaimer: This note was dictated with voice recognition software. Similar sounding words can inadvertently be transcribed and this note may contain transcription errors which may not have been corrected upon publication of  note.**

## 2014-12-11 NOTE — Therapy (Signed)
[place], [place], [address] Phone: [phone]   Fax:  [phone]  Physical Therapy Evaluation  Patient Details  Name: Adam Yang MRN: [id number] Date of Birth: 04-21-1946 Referring Provider:  Heath Lark, MD  Encounter Date: 12/11/2014      PT End of Session - 12/11/14 1654    Visit Number 1   Number of Visits 1   PT Start Time 3710   PT Stop Time 1335   PT Time Calculation (min) 20 min   Activity Tolerance Patient tolerated treatment well   Behavior During Therapy [facility] for tasks assessed/performed      Past Medical History  Diagnosis Date  . Hyperlipidemia   . Hypertension   . Complication of anesthesia   . PONV (postoperative nausea and vomiting)   . Diabetes mellitus without complication     Type II  . Chronic kidney disease     Renal Insuffiency , Creatinine has been in the 2- 3 range, 11/27/14 Creatine 1.4  . Gout   . Arthritis   . Anxiety     Claustrophobia    Past Surgical History  Procedure Laterality Date  . Lymph node biopsy    . Colonoscopy    . Knee surgery Left 1990's    tibia crushed, pinned and bone grafted  . Colonoscopy w/ polypectomy    . Multiple extractions with alveoloplasty N/A 12/06/2014    Procedure: Extraction of tooth #'s 2,4,5,6,7,8,9,10,11,12,13,14,15, 20,22,26,27,28,29 with alveoloplasty.;  Surgeon: Lenn Cal, DDS;  Location: [place];  Service: Oral Surgery;  Laterality: N/A;    There were no vitals filed for this visit.  Visit Diagnosis:  Stiffness of neck - Plan: PT plan of care cert/re-cert      Subjective Assessment - 12/11/14 1641    Subjective had dental extractions in preparation for XRT a week ago and still with mild soreness; wife says he doesn't like to talk much since this was done   Patient is accompained by: Family member  wife   Pertinent History Diagnosed with nasopharyngeal cancer and expected to have chemoradiation therapy  starting approx. 12/23/14; has had dental extractions 12/06/14 and is to have PEG and Portacath placed 12/17/14.  h/o C4-5 compression fracture remotely, mostly resolved and did traction for that; does have some residual left hadn weakness.  left knee scar from a tib-fib compressed fracture; left elbow fracture years ago also.                                                       Currently in Pain? No/denies            [facility] PT Assessment - 12/11/14 0001    Assessment   Medical Diagnosis nasopharyngeal cancer   Onset Date/Surgical Date 11/10/14  approx.   Precautions   Precautions Other (comment)  cancer precautions   Restrictions   Weight Bearing Restrictions No   Balance Screen   Has the patient fallen in the past 6 months No   Has the patient had a decrease in activity level because of a fear of falling?  No   Is the patient reluctant to leave their home because of a fear of falling?  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   Cervical Flexion 2 finger widths chin to chest   Cervical Extension WFL   Cervical - Right Side  Bend 25% loss   Cervical - Left Side Bend 25% loss   Cervical - Right Rotation 25% loss   Cervical - Left Rotation 25% loss   Ambulation/Gait   Ambulation/Gait Yes   Ambulation/Gait Assistance 7: Independent           LYMPHEDEMA/ONCOLOGY QUESTIONNAIRE - 12/11/14 1653    Type   Cancer Type nasopharyngeal   Lymphedema Assessments   Lymphedema Assessments Head and Neck   Head and Neck   4 cm superior to sternal notch around neck 37 cm   6 cm superior to sternal notch around neck 37 cm   8 cm superior to sternal notch around neck 38.2 cm                        PT Education - 12/11/14 1654    Education provided Yes   Education Details neck ROM, posture, walking, lymphedema info   Person(s) Educated Patient;Spouse   Methods Explanation;Handout   Comprehension Verbalized understanding                 Head and Neck Clinic Goals - 12/11/14 1658    Patient will be able to verbalize understanding of a home exercise program for cervical range of motion, posture, and walking.    Status Achieved   Patient will be able to verbalize understanding of proper sitting and standing posture.    Status Achieved   Patient will be able to verbalize understanding of lymphedema risk and availability of treatment for this condition.    Status Achieved           Plan - 12/11/14 1655    Clinical Impression Statement Patient who sounds like he is physically fit, having exercise regularly and also having worked at a physically demanding job unloading trucks at [employer]; has mild neck AROM limitations and h/o several orthopedic problems including neck, left elbow, left hand weakness, and left knee injury.                                                       Pt will benefit from skilled therapeutic intervention in order to improve on the following deficits Decreased range of motion   Rehab Potential Excellent   PT Frequency One time visit   PT  Treatment/Interventions Patient/family education   PT Next Visit Plan  None at this time; may benefit from therapy going forward for neck ROM and/or lymphedema, should that develop.   PT Home Exercise Plan neck ROM, posture, walking   Consulted and Agree with Plan of Care Patient          G-Codes - 12-15-14 1658    Functional Assessment Tool Used clinical judgement   Functional Limitation Changing and maintaining body position   Changing and Maintaining Body Position Current Status (F1102) At least 1 percent but less than 20 percent impaired, limited or restricted       Problem List Patient Active Problem List   Diagnosis Date Noted  . Nasopharyngeal cancer 11/26/2014    Adam Yang 2014-12-15, 5:00 PM  [place], [place], [address] Phone: [phone]   Fax:  [place], PT 2014/12/15 5:00 PM

## 2014-12-11 NOTE — Telephone Encounter (Signed)
Schedule treatment per pof. Scheduler advise

## 2014-12-11 NOTE — Progress Notes (Signed)
He is currently in no pain.   Pt denies dysphagia. Pt reports a regular unmodified diet orally-soft. Pt reports he is claustrophobic and is worried about wearing the treatment mask.  He reports Dr. Isidore Moos prescribed antianxiety medication to take prior to treatments.    Wt Readings from Last 3 Encounters:  12/11/14 175 lb 6.4 oz (79.561 kg)  12/06/14 175 lb (79.379 kg)  12/05/14 175 lb (79.379 kg)   BP 146/75 mmHg  Pulse 85  Temp(Src) 98.4 F (36.9 C) (Oral)  Resp 12  Wt 175 lb 6.4 oz (79.561 kg)  SpO2 97%

## 2014-12-12 ENCOUNTER — Ambulatory Visit
Admission: RE | Admit: 2014-12-12 | Discharge: 2014-12-12 | Disposition: A | Discharge status: Home / Self Care | Payer: Medicare Other | Source: Ambulatory Visit | Attending: Radiation Oncology | Admitting: Radiation Oncology

## 2014-12-12 ENCOUNTER — Encounter: Payer: Self-pay | Admitting: *Deleted

## 2014-12-12 DIAGNOSIS — C119 Malignant neoplasm of nasopharynx, unspecified: Secondary | ICD-10-CM

## 2014-12-12 NOTE — Progress Notes (Signed)
Head & Neck Multidisciplinary Clinic Clinical Social Work  Clinical Social Work met with patient/family at head & neck multidisciplinary clinic to offer support and assess for psychosocial needs.  Adam Yang "Adam Yang" was accompanied by his spouse.  He shared his main concern was financial and wondering with the cost of treatment will be.  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  Adam Yang answered minimal questions regarding his emotional response to his diagnosis, spouse shared it is difficult for him to talk about it.  Patient's spouse had questions regarding home health education for feeding tube- CSW assured patient/family that [name], RD, and navigator will review this with them.    Clinical Social Work briefly discussed Clinical Social Work role and [employer] support programs/services.  Clinical Social Work encouraged patient to call with any additional questions or concerns.   Polo Riley, MSW, LCSW, OSW-C Clinical Social Worker [facility] [phone]

## 2014-12-12 NOTE — Progress Notes (Signed)
Complex simulation/treatment planning note: The patient was taken to the CT simulator.  A head cast was constructed for immobilization.  He was then scanned.  The CT data set including a PET fusion was sent to the MIM system where I contoured his nasopharyngeal primary, GTVnp 70, right neck node, GTVRln70, left neck node GtvLln70 and expanded these by 0.5 cm to create respective clinical target volumes.  I also contoured his high-risk lymph node/tumor bed volume CTV59.5.  The low risk lymph node volume (lower neck) PTV 56 was also contoured.  I also contoured avoidance structures including the right and left parotid glands, brainstem, spinal cord, left cochlea, right cochlea, glottic larynx, pharyngeal constrictors, esophagus, and oral cavity.  The spinal cord, and brainstem were expanded by 0.5 cm for planning avoidance structures.  All clinical target volumes will be expanded by 0.5 cm to create respective planning target volumes.  I am prescribing 7000 cGy in 35 sessions to his nasopharyngeal primary PTV and left and right lymph nodes.  I'm prescribing 5950 cGy in 35 sessions to his high risk nodal volume and tumor bed.  I am prescribing 5600 cGy in 35 sessions to his PTV 56 (low risk lymph node volume).  He is now ready for IMRT simulation/treatment planning.

## 2014-12-13 NOTE — Progress Notes (Signed)
Oncology Nurse Navigator Documentation  Oncology Nurse Navigator Flowsheets 12/11/2014  Referral date to RadOnc/MedOnc -  Navigator Encounter Type Clinic/MDC  Facilitated patient appts with RD, PT Lymphedema, LCSW during [place].   We discussed PEG/PAC placement scheduled for next Tuesday, I provided initial education on care and use of PEG.  He understands he can contact me with needs/concerns.   Patient Visit Type -  Barriers/Navigation Needs -  Interventions -  Time Spent with Patient [place], RN, BSN, [place] at [place] [phone]

## 2014-12-13 NOTE — Progress Notes (Signed)
Oncology Nurse Navigator Documentation  Oncology Nurse Navigator Flowsheets 12/12/2014  Referral date to RadOnc/MedOnc -  Navigator Encounter Type Other  Patient Visit Type Radonc  To provide support, encouragement and care continuity, met with patient during CT/SIM.  He was accompanied by his wife. 1. He experienced swallowing difficulty during initial mask fitting d/t mask tightness around neck.  He tolerated second attempt with minimally looser fit but still found swallowing difficult.  He stated he will likely take Ativan for tmts, at least initially.  2. I again showed them the [place] area, explained arrival and tmt preparation, tmt procedures.  They verbalized understanding. 3. I provided Epic calendar for upcoming appts. They understand I can be contacted with questions/concerns.   Barriers/Navigation Needs -  Interventions -  Time Spent with Patient [place], RN, BSN, [place] at [place] [phone]

## 2014-12-16 ENCOUNTER — Encounter (HOSPITAL_COMMUNITY): Payer: Self-pay | Admitting: Dentistry

## 2014-12-16 ENCOUNTER — Other Ambulatory Visit: Payer: Self-pay | Admitting: Physician Assistant

## 2014-12-16 ENCOUNTER — Other Ambulatory Visit: Payer: Self-pay | Admitting: *Deleted

## 2014-12-16 ENCOUNTER — Other Ambulatory Visit: Payer: Self-pay | Admitting: Radiology

## 2014-12-16 ENCOUNTER — Ambulatory Visit (HOSPITAL_COMMUNITY): Payer: Self-pay | Admitting: Dentistry

## 2014-12-16 ENCOUNTER — Encounter: Payer: Self-pay | Admitting: *Deleted

## 2014-12-16 VITALS — BP 129/62 | HR 78 | Temp 98.6°F

## 2014-12-16 DIAGNOSIS — C119 Malignant neoplasm of nasopharynx, unspecified: Secondary | ICD-10-CM

## 2014-12-16 DIAGNOSIS — K0820 Unspecified atrophy of edentulous alveolar ridge: Secondary | ICD-10-CM

## 2014-12-16 DIAGNOSIS — K08109 Complete loss of teeth, unspecified cause, unspecified class: Secondary | ICD-10-CM

## 2014-12-16 DIAGNOSIS — Z01818 Encounter for other preprocedural examination: Secondary | ICD-10-CM

## 2014-12-16 DIAGNOSIS — K08409 Partial loss of teeth, unspecified cause, unspecified class: Secondary | ICD-10-CM

## 2014-12-16 NOTE — Progress Notes (Signed)
POST OPERATIVE NOTE:  12/16/2014   Adam Yang [id number]  VITALS: BP 129/62 mmHg  Pulse 78  Temp(Src) 98.6 F (37 C) (Oral)  LABS:  Lab Results  Component Value Date   WBC 5.5 11/27/2014   HGB 14.0 11/27/2014   HCT 41.4 11/27/2014   MCV 85.0 11/27/2014   PLT 190 11/27/2014   BMET    Component Value Date/Time   NA 142 11/27/2014 0941   K 4.6 11/27/2014 0941   CO2 23 11/27/2014 0941   GLUCOSE 129 11/27/2014 0941   BUN 29.5* 11/27/2014 0941   CREATININE 1.4* 11/27/2014 0941   CALCIUM 9.2 11/27/2014 0941    No results found for: INR, PROTIME No results found for: PTT   [name] is status post extraction remaining teeth with alveoloplasty in the operating room on 12/06/2014.  SUBJECTIVE: Patient with minimal oral discomfort. Patient is using salt water rinses as instructed. Patient has several stitches that remain.  EXAM: There is no sign of infection, heme, or ooze. Sutures are loosely intact. Generalized primary closure is noted. Upper left and upper right molar extraction sites are healing in by secondary intention. Valsalva maneuver was negative with no obvious sinus involvement at this time. The patient is edentulous. There is severe atrophy of the edentulous alveolar ridges.  PROCEDURE: The patient was given a chlorhexidine gluconate rinse for 30 seconds. Sutures were then removed without complication. Patient tolerated the procedure well.  ASSESSMENT: Post operative course is consistent with dental procedures performed in the operating room with general anesthesia. The patient is edentulous. There is atrophy of the edentulous alveolar ridges. The patient is cleared for radiation therapy to start on 12/23/2014 with Dr. Valere Dross.  PLAN: 1. Continue salt water rinses as needed to aid healing. 2. Advanced soft diet as tolerated. 3. Brush tongue daily. 4. Patient is cleared for radiation therapy to start 12/23/2014 with Dr. Valere Dross. 5. Return to clinic as  scheduled for evaluation of healing and oral examination during radiation therapy.   Lenn Cal, DDS

## 2014-12-16 NOTE — Patient Instructions (Signed)
PLAN: 1. Continue salt water rinses as needed to aid healing. 2. Advanced soft diet as tolerated. 3. Brush tongue daily. 4. Patient is cleared for radiation therapy to start 12/23/2014 with Dr. Valere Dross. 5. Return to clinic as scheduled for evaluation of healing and oral examination during radiation therapy.   Lenn Cal, DDS

## 2014-12-16 NOTE — Progress Notes (Signed)
Oncology Nurse Navigator Documentation  Oncology Nurse Navigator Flowsheets 12/16/2014  Referral date to RadOnc/MedOnc -  Navigator Encounter Type Other  Placed order for [facility] referral for nursing PEG education/instruction.  Called Crystal with [facility] to inform of patient procedure for tomorrow, 12/17/14.  She verbalized understanding.  Patient Visit Type -  Barriers/Navigation Needs -  Interventions -  Time Spent with Patient 15

## 2014-12-17 ENCOUNTER — Ambulatory Visit (HOSPITAL_COMMUNITY)
Admission: RE | Admit: 2014-12-17 | Discharge: 2014-12-17 | Disposition: A | Discharge status: Home / Self Care | Payer: Medicare Other | Source: Ambulatory Visit | Attending: Hematology and Oncology | Admitting: Hematology and Oncology

## 2014-12-17 ENCOUNTER — Encounter: Payer: Self-pay | Admitting: *Deleted

## 2014-12-17 ENCOUNTER — Encounter (HOSPITAL_COMMUNITY): Payer: Self-pay

## 2014-12-17 ENCOUNTER — Telehealth: Payer: Self-pay | Admitting: *Deleted

## 2014-12-17 ENCOUNTER — Encounter: Payer: Self-pay | Admitting: Radiation Oncology

## 2014-12-17 ENCOUNTER — Other Ambulatory Visit: Payer: Self-pay | Admitting: Hematology and Oncology

## 2014-12-17 DIAGNOSIS — N189 Chronic kidney disease, unspecified: Secondary | ICD-10-CM | POA: Insufficient documentation

## 2014-12-17 DIAGNOSIS — M109 Gout, unspecified: Secondary | ICD-10-CM | POA: Insufficient documentation

## 2014-12-17 DIAGNOSIS — J329 Chronic sinusitis, unspecified: Secondary | ICD-10-CM | POA: Diagnosis not present

## 2014-12-17 DIAGNOSIS — E1122 Type 2 diabetes mellitus with diabetic chronic kidney disease: Secondary | ICD-10-CM | POA: Insufficient documentation

## 2014-12-17 DIAGNOSIS — Z87891 Personal history of nicotine dependence: Secondary | ICD-10-CM | POA: Insufficient documentation

## 2014-12-17 DIAGNOSIS — M1990 Unspecified osteoarthritis, unspecified site: Secondary | ICD-10-CM | POA: Insufficient documentation

## 2014-12-17 DIAGNOSIS — C119 Malignant neoplasm of nasopharynx, unspecified: Secondary | ICD-10-CM

## 2014-12-17 DIAGNOSIS — F419 Anxiety disorder, unspecified: Secondary | ICD-10-CM | POA: Diagnosis not present

## 2014-12-17 DIAGNOSIS — K4090 Unilateral inguinal hernia, without obstruction or gangrene, not specified as recurrent: Secondary | ICD-10-CM | POA: Insufficient documentation

## 2014-12-17 DIAGNOSIS — I129 Hypertensive chronic kidney disease with stage 1 through stage 4 chronic kidney disease, or unspecified chronic kidney disease: Secondary | ICD-10-CM | POA: Diagnosis not present

## 2014-12-17 DIAGNOSIS — E785 Hyperlipidemia, unspecified: Secondary | ICD-10-CM | POA: Diagnosis not present

## 2014-12-17 LAB — CBC WITH DIFFERENTIAL/PLATELET
BASOS PCT: 0 % (ref 0–1)
Basophils Absolute: 0 10*3/uL (ref 0.0–0.1)
Eosinophils Absolute: 0.3 10*3/uL (ref 0.0–0.7)
Eosinophils Relative: 5 % (ref 0–5)
HEMATOCRIT: 39.1 % (ref 39.0–52.0)
Hemoglobin: 13.4 g/dL (ref 13.0–17.0)
Lymphocytes Relative: 16 % (ref 12–46)
Lymphs Abs: 0.8 10*3/uL (ref 0.7–4.0)
MCH: 29.1 pg (ref 26.0–34.0)
MCHC: 34.3 g/dL (ref 30.0–36.0)
MCV: 85 fL (ref 78.0–100.0)
MONO ABS: 0.6 10*3/uL (ref 0.1–1.0)
Monocytes Relative: 11 % (ref 3–12)
NEUTROS PCT: 68 % (ref 43–77)
Neutro Abs: 3.7 10*3/uL (ref 1.7–7.7)
PLATELETS: 212 10*3/uL (ref 150–400)
RBC: 4.6 MIL/uL (ref 4.22–5.81)
RDW: 13 % (ref 11.5–15.5)
WBC: 5.3 10*3/uL (ref 4.0–10.5)

## 2014-12-17 LAB — PROTIME-INR
INR: 1 (ref 0.00–1.49)
Prothrombin Time: 13.4 seconds (ref 11.6–15.2)

## 2014-12-17 LAB — APTT: aPTT: 30 seconds (ref 24–37)

## 2014-12-17 LAB — GLUCOSE, CAPILLARY
GLUCOSE-CAPILLARY: 153 mg/dL — AB (ref 65–99)
Glucose-Capillary: 118 mg/dL — ABNORMAL HIGH (ref 65–99)

## 2014-12-17 MED ORDER — LIDOCAINE HCL 1 % IJ SOLN
INTRAMUSCULAR | Status: AC
  Filled 2014-12-17: qty 20

## 2014-12-17 MED ORDER — FENTANYL CITRATE (PF) 100 MCG/2ML IJ SOLN
INTRAMUSCULAR | Status: AC | PRN
  Administered 2014-12-17: 50 ug via INTRAVENOUS
  Administered 2014-12-17 (×2): 25 ug via INTRAVENOUS

## 2014-12-17 MED ORDER — CEFAZOLIN SODIUM-DEXTROSE 2-3 GM-% IV SOLR: 2.0000 g | INTRAVENOUS | Status: AC

## 2014-12-17 MED ORDER — HEPARIN SOD (PORK) LOCK FLUSH 100 UNIT/ML IV SOLN
INTRAVENOUS | Status: AC
  Filled 2014-12-17: qty 5

## 2014-12-17 MED ORDER — LIDOCAINE-EPINEPHRINE 2 %-1:100000 IJ SOLN
INTRAMUSCULAR | Status: AC
  Filled 2014-12-17: qty 1

## 2014-12-17 MED ORDER — GLUCAGON HCL RDNA (DIAGNOSTIC) 1 MG IJ SOLR
INTRAMUSCULAR | Status: AC | PRN
  Administered 2014-12-17: 1 mg via INTRAVENOUS

## 2014-12-17 MED ORDER — FENTANYL CITRATE (PF) 100 MCG/2ML IJ SOLN
INTRAMUSCULAR | Status: AC
  Filled 2014-12-17: qty 4

## 2014-12-17 MED ORDER — MIDAZOLAM HCL 2 MG/2ML IJ SOLN
INTRAMUSCULAR | Status: AC
  Filled 2014-12-17: qty 6

## 2014-12-17 MED ORDER — MIDAZOLAM HCL 2 MG/2ML IJ SOLN
INTRAMUSCULAR | Status: AC | PRN
  Administered 2014-12-17: 0.5 mg via INTRAVENOUS
  Administered 2014-12-17: 1 mg via INTRAVENOUS
  Administered 2014-12-17: 0.5 mg via INTRAVENOUS

## 2014-12-17 MED ORDER — CEFAZOLIN SODIUM-DEXTROSE 2-3 GM-% IV SOLR
INTRAVENOUS | Status: AC
  Administered 2014-12-17: 2000 mg
  Filled 2014-12-17: qty 50

## 2014-12-17 MED ORDER — GLUCAGON HCL RDNA (DIAGNOSTIC) 1 MG IJ SOLR
INTRAMUSCULAR | Status: AC
  Filled 2014-12-17: qty 1

## 2014-12-17 MED ORDER — HEPARIN SOD (PORK) LOCK FLUSH 100 UNIT/ML IV SOLN
INTRAVENOUS | Status: AC | PRN
  Administered 2014-12-17: 500 [IU]

## 2014-12-17 NOTE — H&P (Signed)
Referring Physician(s): Gorsuch,Ni  History of Present Illness: Adam Yang is a 69 y.o. male with Squamous cell carcinoma of nasopharynx scheduled today for port a catheter placement and percutaneous gastrostomy tube placement. He denies any chest pain, shortness of breath or palpitations. He denies any active signs of bleeding or excessive bruising. He denies any recent fever or chills. The patient denies any history of sleep apnea or chronic oxygen use. He has previously tolerated sedation without complications. He is currently able to eat and swallow without difficulty.    Past Medical History  Diagnosis Date  . Hyperlipidemia   . Hypertension   . Complication of anesthesia   . PONV (postoperative nausea and vomiting)   . Diabetes mellitus without complication     Type II  . Chronic kidney disease     Renal Insuffiency , Creatinine has been in the 2- 3 range, 11/27/14 Creatine 1.4  . Gout   . Arthritis   . Anxiety     Claustrophobia    Past Surgical History  Procedure Laterality Date  . Lymph node biopsy    . Colonoscopy    . Knee surgery Left 1990's    tibia crushed, pinned and bone grafted  . Colonoscopy w/ polypectomy    . Multiple extractions with alveoloplasty N/A 12/06/2014    Procedure: Extraction of tooth #'s 2,4,5,6,7,8,9,10,11,12,13,14,15, 20,22,26,27,28,29 with alveoloplasty.;  Surgeon: Lenn Cal, DDS;  Location: [facility];  Service: Oral Surgery;  Laterality: N/A;    Allergies: Review of patient's allergies indicates no known allergies.  Medications: Prior to Admission medications   Medication Sig Start Date End Date Taking? Authorizing Provider  acetaminophen (TYLENOL) 500 MG tablet Take 500-1,000 mg by mouth every 6 (six) hours as needed for mild pain.    Historical Provider, MD  amLODipine (NORVASC) 5 MG tablet Take 5 mg by mouth every morning.     Historical Provider, MD  atorvastatin (LIPITOR) 20 MG tablet Take 20 mg by mouth daily.     Historical Provider, MD  diclofenac (VOLTAREN) 75 MG EC tablet Take 75 mg by mouth daily as needed for mild pain.     Historical Provider, MD  glimepiride (AMARYL) 2 MG tablet Take 2 mg by mouth every evening.  11/14/14   Historical Provider, MD  LORazepam (ATIVAN) 1 MG tablet Take 1 tablet (1 mg total) by mouth 2 (two) times daily as needed for anxiety. 11/26/14   Heath Lark, MD  losartan (COZAAR) 50 MG tablet Take 50 mg by mouth every morning.  10/19/14   Historical Provider, MD  Multiple Vitamins-Minerals (CENTRUM SILVER PO) Take by mouth daily.    Historical Provider, MD  Omega-3 Fatty Acids (FISH OIL PO) Take by mouth daily.    Historical Provider, MD  oxyCODONE-acetaminophen (PERCOCET) 5-325 MG per tablet Take one or two tablets by mouth every 4-6 hours as needed for pain. 12/06/14   Lenn Cal, DDS     Family History  Problem Relation Age of Onset  . Cancer Mother     throat ca  . Cancer Father     lung ca  . Cancer Brother     throat ca  . Heart block Brother     History   Social History  . Marital Status: Married    Spouse Name: N/A  . Number of Children: 1  . Years of Education: N/A   Social History Main Topics  . Smoking status: Former Smoker -- 1.00 packs/day for 25 years  Quit date: 07/12/1998  . Smokeless tobacco: Never Used  . Alcohol Use: No  . Drug Use: No  . Sexual Activity: Not on file   Other Topics Concern  . None   Social History Narrative   Patient is married and had 1 child. 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      Review of Systems: A 12 point ROS discussed and pertinent positives are indicated in the HPI above.  All other systems are negative.  Review of Systems  Vital Signs: T: 98.29F, HR: 86bpm BP: 153/45mmHg, O2: 98%RA  Physical Exam  Constitutional: He is oriented to person, place, and time. No distress.  HENT:  Head: Normocephalic and atraumatic.  Neck:  No tracheal deviation present.  Cardiovascular: Normal rate and regular rhythm.  Exam reveals no gallop and no friction rub.   No murmur heard. Pulmonary/Chest: Effort normal and breath sounds normal. No respiratory distress. He has no wheezes. He has no rales.  Abdominal: Soft. Bowel sounds are normal. He exhibits no distension. There is no tenderness.  Neurological: He is alert and oriented to person, place, and time.  Skin: Skin is warm and dry. He is not diaphoretic.  Psychiatric: He has a normal mood and affect. His behavior is normal. Thought content normal.    Mallampati Score:  MD Evaluation Airway: WNL Heart: WNL Abdomen: WNL Chest/ Lungs: WNL ASA  Classification: 3 Mallampati/Airway Score: Two  Imaging: Ct Soft Tissue Neck W Contrast  11/21/2014   CLINICAL DATA:  69 year old male with bilateral palpable neck lesions discovered 8 months ago. Recently increased size of the left lesion. No associated pain. Initial encounter.  EXAM: CT NECK WITH CONTRAST  TECHNIQUE: Multidetector CT imaging of the neck was performed using the standard protocol following the bolus administration of intravenous contrast.  CONTRAST:  50 mL Omnipaque 300.  COMPARISON:  None.  FINDINGS: Pharynx and larynx: Abnormal mass like soft tissue contour of the left nasopharynx at or just above the fossa of Rosenmuller (series 3, image 15 and coronal image 31). Posterior and lateral margins are indistinct. This encompasses 26 x 35 x 29 mm (AP by transverse by CC). Effacement of the left parapharyngeal fat. The lesion is inseparable from the left longus coli muscle. This is at the level of the pterygoid plates.  Below this the palatine tonsils and soft palate appear within normal limits. The hypopharynx, epiglottis and larynx appear within normal limits. The retropharyngeal and sublingual spaces are within normal limits.  Salivary glands: Submandibular and parotid glands are within normal limits.  Thyroid: Negative.   Lymph nodes:  Spiculated and conglomerate and left level 2/level 5 nodal mass on the left encompassing 54 x 22 x 40 mm (AP by transverse by CC). See sagittal image 49. The anterior extent of this is partially cystic or necrotic (series 3, image 27). The caudal posterior aspect corresponds to the marked palpable area of concern.  Similarly heterogeneous and spiculated right level 2 B nodal mass measuring 21 x 19 x 34 mm. See sagittal image 20. This corresponds to the marked palpable area of concern.  Mild stranding of the left level 5 soft tissues below the conglomerate nodal mass. Small bilateral level 5 nodes are normal by size criteria and morphology. The anterior level IIa nodes bilaterally also appear within normal limits. No level 1 lymphadenopathy. Mildly asymmetric but normal by size criteria left level IIIb node on series 3, image  47. No level 4 lymphadenopathy.  Vascular: Bilateral carotid calcified plaque. Major vascular structures appear to remain patent. The left carotid space an the right IJ are abutted by the conglomerate nodal masses.  Limited intracranial: Negative.  Visualized orbits: Negative.  Mastoids and visualized paranasal sinuses: Widespread paranasal sinus mucosal thickening and opacification. Left mastoid effusion. Both tympanic cavities are clear. Right mastoids are clear.  Skeleton: Bone mineralization at the skullbase appears within normal limits. The clivus is within normal limits.  Degenerative changes throughout the cervical spine. No acute or suspicious osseous lesion identified.  Upper chest: No superior mediastinal lymphadenopathy. Mild dependent atelectasis. No upper lung nodule. No axillary lymphadenopathy.  IMPRESSION: 1. Left nasopharyngeal soft tissue mass with left > right level 2B and level 5 ex-nodal metastatic disease. Poorly marginated primary lesion measuring up to 30-35 mm. Ex-nodal disease encompassing 54 mm on the left and 34 mm on the right. 2. Top differential  considerations are primary nasopharyngeal carcinoma versus pharyngeal squamous cell carcinoma. No skullbase invasion is evident by CT. 3. No distant metastatic disease identified in the upper chest. 4. Left mastoid effusion.  Paranasal sinus inflammation.   Electronically Signed   By: Genevie Ann M.D.   On: 11/21/2014 15:03   Mr Jeri Cos HY Contrast  12/06/2014   CLINICAL DATA:  Nasopharyngeal cancer.  EXAM: MRI HEAD WITHOUT AND WITH CONTRAST  TECHNIQUE: Multiplanar, multiecho pulse sequences of the brain and surrounding structures were obtained without and with intravenous contrast.  CONTRAST:  11mL MULTIHANCE GADOBENATE DIMEGLUMINE 529 MG/ML IV SOLN  COMPARISON:  CT of the neck 11/21/14  FINDINGS: A left nasopharyngeal tumor is again noted. The tumor mass measures 4.0 x 3.1 x 2.1 cm. The tumor extends into the left parapharyngeal space nearly to the level of the carotid sheath. There is no invasion of vascular structures. No significant skullbase invasion is evident. There is no evidence perineural spread. Of note, the study was not specifically protocoled for the evaluation of perineural spread. The cavernous sinus is within normal limits bilaterally. No meningeal enhancement is present.  Bilateral level 2 adenopathy a is again noted, better delineated on the recent neck CT.  A left mastoid effusion is associated with the nasopharyngeal tumor. Extensive sinus disease is present. There is scattered opacification of a air cells bilaterally. Circumferential mucosal thickening is noted in the maxillary sinuses and sphenoid sinuses bilaterally. The right mastoid air cells are clear.  Mild atrophy and periventricular white matter changes are within normal limits for age. No acute infarct, hemorrhage, or mass lesion is present. The ventricles are of normal size. No significant extraaxial fluid collection is present.  Globes and orbits are intact. Midline structures are otherwise within normal limits.  IMPRESSION: 1. 4.0 cm  left nasopharyngeal tumor extending into the parapharyngeal space without vascular invasion or evidence for perineural spread. 2. Bilateral level 2 adenopathy 3. Normal MRI appearance of the brain for age. 4. Moderate paranasal sinus disease without evidence for additional tumor.   Electronically Signed   By: San Morelle M.D.   On: 12/06/2014 07:07   Nm Pet Image Initial (pi) Skull Base To Thigh  12/04/2014   CLINICAL DATA:  Initial treatment strategy for nasopharyngeal carcinoma.  EXAM: NUCLEAR MEDICINE PET SKULL BASE TO THIGH  TECHNIQUE: 8.9 mCi F-18 FDG was injected intravenously. Full-ring PET imaging was performed from the skull base to thigh after the radiotracer. CT data was obtained and used for attenuation correction and anatomic localization.  FASTING BLOOD GLUCOSE:  Value: 154 mg/dl  COMPARISON:  None.  FINDINGS: NECK  Left posterior nasopharyngeal mass protrudes into the left posterior nasopharynx and extends anterior to the left pterygoid musculature, maximum standard uptake value 15.8. This mass partially effaces the left parapharyngeal space.  The right station 2 lymph node measuring 1.7 cm in short axis on image 29 series 4 has maximum standard uptake value 15.0. Left station 2 lymph node measuring 2.3 cm in short axis on image 31 series 4 has maximum standard uptake value 9.7. The new  Chronic ethmoid, maxillary, and sphenoid sinusitis. Left mastoid effusion.  CHEST  No hypermetabolic mediastinal or hilar nodes. No suspicious pulmonary nodules on the CT scan.  ABDOMEN/PELVIS  No abnormal hypermetabolic activity within the liver, pancreas, adrenal glands, or spleen. Scattered gastrohepatic ligament, peripancreatic, and porta hepatis lymph nodes are not hypermetabolic.  A peripancreatic node measures 1.1 cm in short axis on image 121 series 4; a portacaval node measures 1.0 cm in short axis on image 125 series 4. Fluid density along the anterior margin of the left kidney upper pole may  represent a somewhat flattened cyst; confluent perirenal fluid; or a small amount of subcapsular fluid. Similar finding on the right.  Right inguinal hernia contains adipose tissue.  SKELETON  No focal hypermetabolic activity to suggest skeletal metastasis.  IMPRESSION: 1. Left posterior nasopharyngeal mass, maximum standard uptake value 15.8. 2. Bilateral station 2 pathologic adenopathy is hypermetabolic as noted above. 3. Chronic paranasal sinusitis with subtotal left mastoid effusion. 4. Regional upper abdominal lymph nodes are not hypermetabolic and likely represent reactive lymph nodes.   Electronically Signed   By: Van Clines M.D.   On: 12/04/2014 09:22    Labs:  CBC:  Recent Labs  11/27/14 0941 12/17/14 0938  WBC 5.5 5.3  HGB 14.0 13.4  HCT 41.4 39.1  PLT 190 212    COAGS: No results for input(s): INR, APTT in the last 8760 hours.  BMP:  Recent Labs  11/27/14 0941  NA 142  K 4.6  CO2 23  GLUCOSE 129  BUN 29.5*  CALCIUM 9.2  CREATININE 1.4*    LIVER FUNCTION TESTS:  Recent Labs  11/27/14 0941  BILITOT 0.52  AST 22  ALT 24  ALKPHOS 72  PROT 6.8  ALBUMIN 3.5    Assessment and Plan: Squamous cell carcinoma of Nasopharynx  Scheduled today for image guided port a catheter placement and percutaneous gastrostomy tube placement with sedation The patient has been NPO-he did drink barium last evening, no blood thinners taken, labs and vitals have been reviewed.  Risks and Benefits discussed with the patient including, but not limited to bleeding, infection, pneumothorax, or fibrin sheath development and need for additional procedures. All of the patient's questions were answered, patient is agreeable to proceed. Consent signed and in chart.  Risks and Benefits discussed with the patient including, but not limited to bleeding, infection, peritonitis, or damage to adjacent structures. All of the patient's questions were answered, patient is agreeable to  proceed. Consent signed and in chart.    SignedHedy Jacob 12/17/2014, 11:14 AM

## 2014-12-17 NOTE — Progress Notes (Signed)
Oncology Nurse Navigator Documentation  Oncology Nurse Navigator Flowsheets 12/17/2014  Referral date to RadOnc/MedOnc -  Navigator Encounter Type Other  Patient Visit Type Surgery  To provide support and encouragement, care continuity and to assess for needs, met patient in [facility] Short Stay where he had arrived for [facility] and PEG placement.  His wife accompanied him.  We discussed:  PAC and PEG care and use.  I reinforced education provided by surgical PA.  Upcoming appts.  He asked that his 12/23/14 SLP with Garald Balding be rescheduled as it is the same day as first chemo/RT.  He noted he would prefer a day other than a Monday since that will be the day of weekly chemo.  I indicated I would cancel appt, arrange for rescheduling on a more convenient day.  His concern re: anxiety and start of RT on 6/13.  He stated the 1 mg Ativan he took prior to coming in this morning has not helped with anxiety re: today's procedures.  I responded that I would speak with Dr. Valere Dross, relay his guidance.  Use of mesh brief to secure PEG tube as alternative to tape.  I provided 2 pair.   Wife confirmed call from [place] and scheduling of home visit for tomorrow.     Barriers/Navigation Needs -  Interventions -  Time Spent with Patient [place], RN, BSN, [facility] at [place] [phone]

## 2014-12-17 NOTE — Discharge Instructions (Signed)
Gastrostomy Tube Home Guide A gastrostomy tube is a tube that is surgically placed into the stomach. It is also called a "G-tube." G-tubes are used when a person is unable to eat and drink enough on their own to stay healthy. The tube is inserted into the stomach through a small cut (incision) in the skin. This tube is used for:  Feeding.  Giving medication. GASTROSTOMY TUBE CARE  Wash your hands with soap and water.  Remove the old dressing (if any). Some styles of G-tubes may need a dressing inserted between the skin and the G-tube. Other types of G-tubes do not require a dressing. Ask your health care provider if a dressing is needed.  Check the area where the tube enters the skin (insertion site) for redness, swelling, or pus-like (purulent) drainage. A small amount of clear or tan liquid drainage is normal. Check to make sure scar tissue (skin) is not growing around the insertion site. This could have a raised, bumpy appearance.  A cotton swab can be used to clean the skin around the tube:  When the G-tube is first put in, a normal saline solution or water can be used to clean the skin.  Mild soap and warm water can be used when the skin around the G-tube site has healed.  Roll the cotton swab around the G-tube insertion site to remove any drainage or crusting at the insertion site. STOMACH RESIDUALS Feeding tube residuals are the amount of liquids that are in the stomach at any given time. Residuals may be checked before giving feedings, medications, or as instructed by your health care provider.  Ask your health care provider if there are instances when you would not start tube feedings depending on the amount or type of contents withdrawn from the stomach.  Check residuals by attaching a syringe to the G-tube and pulling back on the syringe plunger. Note the amount, and return the residual back into the stomach. FLUSHING THE G-TUBE  The G-tube should be periodically flushed with  clean warm water to keep it from clogging.  Flush the G-tube after feedings or medications. Draw up 30 mL of warm water in a syringe. Connect the syringe to the G-tube and slowly push the water into the tube.  Do not push feedings, medications, or flushes rapidly. Flush the G-tube gently and slowly.  Only use syringes made for G-tubes to flush medications or feedings.  Your health care provider may want the G-tube flushed more often or with more water. If this is the case, follow your health care provider's instructions. FEEDINGS Your health care provider will determine whether feedings are given as a bolus (a certain amount given at one time and at scheduled times) or whether feedings will be given continuously on a feeding pump.   Formulas should be given at room temperature.  If feedings are continuous, no more than 4 hours worth of feedings should be placed in the feeding bag. This helps prevent spoilage or accidental excess infusion.  Cover and place unused formula in the refrigerator.  If feedings are continuous, stop the feedings when medications or flushes are given. Be sure to restart the feedings.  Feeding bags and syringes should be replaced as instructed by your health care provider. GIVING MEDICATION   In general, it is best if all medications are in a liquid form for G-tube administration. Liquid medications are less likely to clog the G-tube.  Mix the liquid medication with 30 mL (or amount recommended by your  health care provider) of warm water.  Draw up the medication into the syringe.  Attach the syringe to the G-tube and slowly push the mixture into the G-tube.  After giving the medication, draw up 30 mL of warm water in the syringe and slowly flush the G-tube.  For pills or capsules, check with your health care provider first before crushing medications. Some pills are not effective if they are crushed. Some capsules are sustained-release medications.  If  appropriate, crush the pill or capsule and mix with 30 mL of warm water. Using the syringe, slowly push the medication through the tube, then flush the tube with another 30 mL of tap water. G-TUBE PROBLEMS G-tube was pulled out.  Cause: May have been pulled out accidentally.  Solutions: Cover the opening with clean dressing and tape. Call your health care provider right away. The G-tube should be put in as soon as possible (within 4 hours) so the G-tube opening (tract) does not close. The G-tube needs to be put in at a health care setting. An X-ray needs to be done to confirm placement before the G-tube can be used again. Redness, irritation, soreness, or foul odor around the gastrostomy site.  Cause: May be caused by leakage or infection.  Solutions: Call your health care provider right away. Large amount of leakage of fluid or mucus-like liquid present (a large amount means it soaks clothing).  Cause: Many reasons could cause the G-tube to leak.  Solutions: Call your health care provider to discuss the amount of leakage. Skin or scar tissue appears to be growing where tube enters skin.   Cause: Tissue growth may develop around the insertion site if the G-tube is moved or pulled on excessively.  Solutions: Secure tube with tape so that excess movement does not occur. Call your health care provider. G-tube is clogged.  Cause: Thick formula or medication.  Solutions: Try to slowly push warm water into the tube with a large syringe. Never try to push any object into the tube to unclog it. Do not force fluid into the G-tube. If you are unable to unclog the tube, call your health care provider right away. TIPS  Head of bed (HOB) position refers to the upright position of a person's upper body.  When giving medications or a feeding bolus, keep the [facility] up as told by your health care provider. Do this during the feeding and for 1 hour after the feeding or medication administration.  If  continuous feedings are being given, it is best to keep the [facility] up as told by your health care provider. When ADLs (activities of daily living) are performed and the [facility] needs to be flat, be sure to turn the feeding pump off. Restart the feeding pump when the [facility] is returned to the recommended height.  Do not pull or put tension on the tube.  To prevent fluid backflow, kink the G-tube before removing the cap or disconnecting a syringe.  Check the G-tube length every day. Measure from the insertion site to the end of the G-tube. If the length is longer than previous measurements, the tube may be coming out. Call your health care provider if you notice increasing G-tube length.  Oral care, such as brushing teeth, must be continued.  You may need to remove excess air (vent) from the G-tube. Your health care provider will tell you if this is needed.  Always call your health care provider if you have questions or problems with the G-tube.  SEEK IMMEDIATE MEDICAL CARE IF:   You have severe abdominal pain, tenderness, or abdominal bloating (distension).  You have nausea or vomiting.  You are constipated or have problems moving your bowels.  The G-tube insertion site is red, swollen, has a foul smell, or has yellow or brown drainage.  You have difficulty breathing or shortness of breath.  You have a fever.  You have a large amount of feeding tube residuals.  The G-tube is clogged and cannot be flushed. MAKE SURE YOU:   Understand these instructions.  Will watch your condition.  Will get help right away if you are not doing well or get worse. Document Released: 09/06/2001 Document Revised: 11/12/2013 Document Reviewed: 03/05/2013 [facility] Patient Information 2015 [place], [place]. This information is not intended to replace advice given to you by your health care provider. Make sure you discuss any questions you have with your health care provider. Implanted Port Insertion, Care  After Refer to this sheet in the next few weeks. These instructions provide you with information on caring for yourself after your procedure. Your health care provider may also give you more specific instructions. Your treatment has been planned according to current medical practices, but problems sometimes occur. Call your health care provider if you have any problems or questions after your procedure. WHAT TO EXPECT AFTER THE PROCEDURE After your procedure, it is typical to have the following:   Discomfort at the port insertion site. Ice packs to the area will help.  Bruising on the skin over the port. This will subside in 3-4 days. HOME CARE INSTRUCTIONS  After your port is placed, you will get a manufacturer's information card. The card has information about your port. Keep this card with you at all times.   Know what kind of port you have. There are many types of ports available.   Wear a medical alert bracelet in case of an emergency. This can help alert health care workers that you have a port.   The port can stay in for as long as your health care provider believes it is necessary.   A home health care nurse may give medicines and take care of the port.   You or a family member can get special training and directions for giving medicine and taking care of the port at home.  SEEK MEDICAL CARE IF:   Your port does not flush or you are unable to get a blood return.   You have a fever or chills. SEEK IMMEDIATE MEDICAL CARE IF:  You have new fluid or pus coming from your incision.   You notice a bad smell coming from your incision site.   You have swelling, pain, or more redness at the incision or port site.   You have chest pain or shortness of breath. Document Released: 04/18/2013 Document Revised: 07/03/2013 Document Reviewed: 04/18/2013 [facility] Patient Information 2015 [place], [place]. This information is not intended to replace advice given to you by your health  care provider. Make sure you discuss any questions you have with your health care provider. Implanted [facility] Guide An implanted port is a type of central line that is placed under the skin. Central lines are used to provide IV access when treatment or nutrition needs to be given through a person's veins. Implanted ports are used for long-term IV access. An implanted port may be placed because:   You need IV medicine that would be irritating to the small veins in your hands or arms.  You need long-term IV medicines, such as antibiotics.   You need IV nutrition for a long period.   You need frequent blood draws for lab tests.   You need dialysis.  Implanted ports are usually placed in the chest area, but they can also be placed in the upper arm, the abdomen, or the leg. An implanted port has two main parts:   Reservoir. The reservoir is round and will appear as a small, raised area under your skin. The reservoir is the part where a needle is inserted to give medicines or draw blood.   Catheter. The catheter is a thin, flexible tube that extends from the reservoir. The catheter is placed into a large vein. Medicine that is inserted into the reservoir goes into the catheter and then into the vein.  HOW WILL I CARE FOR MY INCISION SITE? Do not get the incision site wet. Bathe or shower as directed by your health care provider.  HOW IS MY PORT ACCESSED? Special steps must be taken to access the port:   Before the port is accessed, a numbing cream can be placed on the skin. This helps numb the skin over the port site.   Your health care provider uses a sterile technique to access the port.  Your health care provider must put on a mask and sterile gloves.  The skin over your port is cleaned carefully with an antiseptic and allowed to dry.  The port is gently pinched between sterile gloves, and a needle is inserted into the port.  Only "non-coring" port needles should be used to  access the port. Once the port is accessed, a blood return should be checked. This helps ensure that the port is in the vein and is not clogged.   If your port needs to remain accessed for a constant infusion, a clear (transparent) bandage will be placed over the needle site. The bandage and needle will need to be changed every week, or as directed by your health care provider.   Keep the bandage covering the needle clean and dry. Do not get it wet. Follow your health care provider's instructions on how to take a shower or bath while the port is accessed.   If your port does not need to stay accessed, no bandage is needed over the port.  WHAT IS FLUSHING? Flushing helps keep the port from getting clogged. Follow your health care provider's instructions on how and when to flush the port. Ports are usually flushed with saline solution or a medicine called heparin. The need for flushing will depend on how the port is used.   If the port is used for intermittent medicines or blood draws, the port will need to be flushed:   After medicines have been given.   After blood has been drawn.   As part of routine maintenance.   If a constant infusion is running, the port may not need to be flushed.  HOW LONG WILL MY PORT STAY IMPLANTED? The port can stay in for as long as your health care provider thinks it is needed. When it is time for the port to come out, surgery will be done to remove it. The procedure is similar to the one performed when the port was put in.  WHEN SHOULD I SEEK IMMEDIATE MEDICAL CARE? When you have an implanted port, you should seek immediate medical care if:   You notice a bad smell coming from the incision site.   You have swelling, redness,  or drainage at the incision site.   You have more swelling or pain at the port site or the surrounding area.   You have a fever that is not controlled with medicine. Document Released: 06/28/2005 Document Revised:  04/18/2013 Document Reviewed: 03/05/2013 [facility] Patient Information 2015 [place], [place]. This information is not intended to replace advice given to you by your health care provider. Make sure you discuss any questions you have with your health care provider. Conscious Sedation Sedation is the use of medicines to promote relaxation and relieve discomfort and anxiety. Conscious sedation is a type of sedation. Under conscious sedation you are less alert than normal but are still able to respond to instructions or stimulation. Conscious sedation is used during short medical and dental procedures. It is milder than deep sedation or general anesthesia and allows you to return to your regular activities sooner.  LET [facility] CARE PROVIDER KNOW ABOUT:   Any allergies you have.  All medicines you are taking, including vitamins, herbs, eye drops, creams, and over-the-counter medicines.  Use of steroids (by mouth or creams).  Previous problems you or members of your family have had with the use of anesthetics.  Any blood disorders you have.  Previous surgeries you have had.  Medical conditions you have.  Possibility of pregnancy, if this applies.  Use of cigarettes, alcohol, or illegal drugs. RISKS AND COMPLICATIONS Generally, this is a safe procedure. However, as with any procedure, problems can occur. Possible problems include:  Oversedation.  Trouble breathing on your own. You may need to have a breathing tube until you are awake and breathing on your own.  Allergic reaction to any of the medicines used for the procedure. BEFORE THE PROCEDURE  You may have blood tests done. These tests can help show how well your kidneys and liver are working. They can also show how well your blood clots.  A physical exam will be done.  Only take medicines as directed by your health care provider. You may need to stop taking medicines (such as blood thinners, aspirin, or nonsteroidal  anti-inflammatory drugs) before the procedure.   Do not eat or drink at least 6 hours before the procedure or as directed by your health care provider.  Arrange for a responsible adult, family member, or friend to take you home after the procedure. He or she should stay with you for at least 24 hours after the procedure, until the medicine has worn off. PROCEDURE   An intravenous (IV) catheter will be inserted into one of your veins. Medicine will be able to flow directly into your body through this catheter. You may be given medicine through this tube to help prevent pain and help you relax.  The medical or dental procedure will be done. AFTER THE PROCEDURE  You will stay in a recovery area until the medicine has worn off. Your blood pressure and pulse will be checked.   Depending on the procedure you had, you may be allowed to go home when you can tolerate liquids and your pain is under control. Document Released: 03/23/2001 Document Revised: 07/03/2013 Document Reviewed: 03/05/2013 [facility] Patient Information 2015 [place], [place]. This information is not intended to replace advice given to you by your health care provider. Make sure you discuss any questions you have with your health care provider.

## 2014-12-17 NOTE — Telephone Encounter (Signed)
Oncology Nurse Navigator Documentation  Oncology Nurse Navigator Flowsheets 12/17/2014  Referral date to RadOnc/MedOnc -  Navigator Encounter Type Telephone  Spoke with wife and relayed: 1.  12/23/14 appt with Garald Balding has been cancelled, I provided phone number [phone] for patient to call and confirm that 0800 12/24/14 appt is an acceptable alternative. 2.  Per my conversation with Dr. Valere Dross, patient can take 2 mg Ativan prior to first RT appt next week. She verbalized understanding.   Patient Visit Type   Barriers/Navigation Needs -  Interventions -  Time Spent with Patient 15

## 2014-12-17 NOTE — Procedures (Signed)
Successful RT IJ POWER PORT Successful 64fr pull thru GTUBE No comp Stable EBL 0 Full report in PACS

## 2014-12-17 NOTE — Discharge Instructions (Signed)
Implanted Port Insertion, Care After Refer to this sheet in the next few weeks. These instructions provide you with information on caring for yourself after your procedure. Your health care provider may also give you more specific instructions. Your treatment has been planned according to current medical practices, but problems sometimes occur. Call your health care provider if you have any problems or questions after your procedure. WHAT TO EXPECT AFTER THE PROCEDURE After your procedure, it is typical to have the following:   Discomfort at the port insertion site. Ice packs to the area will help.  Bruising on the skin over the port. This will subside in 3-4 days. HOME CARE INSTRUCTIONS  After your port is placed, you will get a manufacturer's information card. The card has information about your port. Keep this card with you at all times.   Know what kind of port you have. There are many types of ports available.   Wear a medical alert bracelet in case of an emergency. This can help alert health care workers that you have a port.   The port can stay in for as long as your health care provider believes it is necessary.   A home health care nurse may give medicines and take care of the port.   You or a family member can get special training and directions for giving medicine and taking care of the port at home.  SEEK MEDICAL CARE IF:   Your port does not flush or you are unable to get a blood return.   You have a fever or chills. SEEK IMMEDIATE MEDICAL CARE IF:  You have new fluid or pus coming from your incision.   You notice a bad smell coming from your incision site.   You have swelling, pain, or more redness at the incision or port site.   You have chest pain or shortness of breath. Document Released: 04/18/2013 Document Revised: 07/03/2013 Document Reviewed: 04/18/2013 [facility] Patient Information 2015 [place], [place]. This information is not intended to replace  advice given to you by your health care provider. Make sure you discuss any questions you have with your health care provider.  May remove dressing and shower 24 to 48 hours post procedure. Keep site clean and dry. Report signs of infection.  Gastrostomy Tube Home Guide A gastrostomy tube is a tube that is surgically placed into the stomach. It is also called a "G-tube." G-tubes are used when a person is unable to eat and drink enough on their own to stay healthy. The tube is inserted into the stomach through a small cut (incision) in the skin. This tube is used for:  Feeding.  Giving medication. GASTROSTOMY TUBE CARE  Wash your hands with soap and water.  Remove the old dressing (if any). Some styles of G-tubes may need a dressing inserted between the skin and the G-tube. Other types of G-tubes do not require a dressing. Ask your health care provider if a dressing is needed.  Check the area where the tube enters the skin (insertion site) for redness, swelling, or pus-like (purulent) drainage. A small amount of clear or tan liquid drainage is normal. Check to make sure scar tissue (skin) is not growing around the insertion site. This could have a raised, bumpy appearance.  A cotton swab can be used to clean the skin around the tube:  When the G-tube is first put in, a normal saline solution or water can be used to clean the skin.  Mild soap and  warm water can be used when the skin around the G-tube site has healed.  Roll the cotton swab around the G-tube insertion site to remove any drainage or crusting at the insertion site. STOMACH RESIDUALS Feeding tube residuals are the amount of liquids that are in the stomach at any given time. Residuals may be checked before giving feedings, medications, or as instructed by your health care provider.  Ask your health care provider if there are instances when you would not start tube feedings depending on the amount or type of contents withdrawn  from the stomach.  Check residuals by attaching a syringe to the G-tube and pulling back on the syringe plunger. Note the amount, and return the residual back into the stomach. FLUSHING THE G-TUBE  The G-tube should be periodically flushed with clean warm water to keep it from clogging.  Flush the G-tube after feedings or medications. Draw up 30 mL of warm water in a syringe. Connect the syringe to the G-tube and slowly push the water into the tube.  Do not push feedings, medications, or flushes rapidly. Flush the G-tube gently and slowly.  Only use syringes made for G-tubes to flush medications or feedings.  Your health care provider may want the G-tube flushed more often or with more water. If this is the case, follow your health care provider's instructions. FEEDINGS Your health care provider will determine whether feedings are given as a bolus (a certain amount given at one time and at scheduled times) or whether feedings will be given continuously on a feeding pump.   Formulas should be given at room temperature.  If feedings are continuous, no more than 4 hours worth of feedings should be placed in the feeding bag. This helps prevent spoilage or accidental excess infusion.  Cover and place unused formula in the refrigerator.  If feedings are continuous, stop the feedings when medications or flushes are given. Be sure to restart the feedings.  Feeding bags and syringes should be replaced as instructed by your health care provider. GIVING MEDICATION   In general, it is best if all medications are in a liquid form for G-tube administration. Liquid medications are less likely to clog the G-tube.  Mix the liquid medication with 30 mL (or amount recommended by your health care provider) of warm water.  Draw up the medication into the syringe.  Attach the syringe to the G-tube and slowly push the mixture into the G-tube.  After giving the medication, draw up 30 mL of warm water in  the syringe and slowly flush the G-tube.  For pills or capsules, check with your health care provider first before crushing medications. Some pills are not effective if they are crushed. Some capsules are sustained-release medications.  If appropriate, crush the pill or capsule and mix with 30 mL of warm water. Using the syringe, slowly push the medication through the tube, then flush the tube with another 30 mL of tap water. G-TUBE PROBLEMS G-tube was pulled out.  Cause: May have been pulled out accidentally.  Solutions: Cover the opening with clean dressing and tape. Call your health care provider right away. The G-tube should be put in as soon as possible (within 4 hours) so the G-tube opening (tract) does not close. The G-tube needs to be put in at a health care setting. An X-ray needs to be done to confirm placement before the G-tube can be used again. Redness, irritation, soreness, or foul odor around the gastrostomy site.  Cause: May  be caused by leakage or infection.  Solutions: Call your health care provider right away. Large amount of leakage of fluid or mucus-like liquid present (a large amount means it soaks clothing).  Cause: Many reasons could cause the G-tube to leak.  Solutions: Call your health care provider to discuss the amount of leakage. Skin or scar tissue appears to be growing where tube enters skin.   Cause: Tissue growth may develop around the insertion site if the G-tube is moved or pulled on excessively.  Solutions: Secure tube with tape so that excess movement does not occur. Call your health care provider. G-tube is clogged.  Cause: Thick formula or medication.  Solutions: Try to slowly push warm water into the tube with a large syringe. Never try to push any object into the tube to unclog it. Do not force fluid into the G-tube. If you are unable to unclog the tube, call your health care provider right away. TIPS  Head of bed (HOB) position refers to the  upright position of a person's upper body.  When giving medications or a feeding bolus, keep the [facility] up as told by your health care provider. Do this during the feeding and for 1 hour after the feeding or medication administration.  If continuous feedings are being given, it is best to keep the [facility] up as told by your health care provider. When ADLs (activities of daily living) are performed and the [facility] needs to be flat, be sure to turn the feeding pump off. Restart the feeding pump when the [facility] is returned to the recommended height.  Do not pull or put tension on the tube.  To prevent fluid backflow, kink the G-tube before removing the cap or disconnecting a syringe.  Check the G-tube length every day. Measure from the insertion site to the end of the G-tube. If the length is longer than previous measurements, the tube may be coming out. Call your health care provider if you notice increasing G-tube length.  Oral care, such as brushing teeth, must be continued.  You may need to remove excess air (vent) from the G-tube. Your health care provider will tell you if this is needed.  Always call your health care provider if you have questions or problems with the G-tube. SEEK IMMEDIATE MEDICAL CARE IF:   You have severe abdominal pain, tenderness, or abdominal bloating (distension).  You have nausea or vomiting.  You are constipated or have problems moving your bowels.  The G-tube insertion site is red, swollen, has a foul smell, or has yellow or brown drainage.  You have difficulty breathing or shortness of breath.  You have a fever.  You have a large amount of feeding tube residuals.  The G-tube is clogged and cannot be flushed. MAKE SURE YOU:   Understand these instructions.  Will watch your condition.  Will get help right away if you are not doing well or get worse. Document Released: 09/06/2001 Document Revised: 11/12/2013 Document Reviewed: 03/05/2013 [facility] Patient  Information 2015 [place], [place]. This information is not intended to replace advice given to you by your health care provider. Make sure you discuss any questions you have with your health care provider.  Clear liquids for 24 hours post procedure.   Conscious Sedation, Adult, Care After Refer to this sheet in the next few weeks. These instructions provide you with information on caring for yourself after your procedure. Your health care provider may also give you more specific instructions. Your treatment has been  planned according to current medical practices, but problems sometimes occur. Call your health care provider if you have any problems or questions after your procedure. WHAT TO EXPECT AFTER THE PROCEDURE  After your procedure:  You may feel sleepy, clumsy, and have poor balance for several hours.  Vomiting may occur if you eat too soon after the procedure. HOME CARE INSTRUCTIONS  Do not participate in any activities where you could become injured for at least 24 hours. Do not:  Drive.  Swim.  Ride a bicycle.  Operate heavy machinery.  Cook.  Use power tools.  Climb ladders.  Work from a high place.  Do not make important decisions or sign legal documents until you are improved.  If you vomit, drink water, juice, or soup when you can drink without vomiting. Make sure you have little or no nausea before eating solid foods.  Only take over-the-counter or prescription medicines for pain, discomfort, or fever as directed by your health care provider.  Make sure you and your family fully understand everything about the medicines given to you, including what side effects may occur.  You should not drink alcohol, take sleeping pills, or take medicines that cause drowsiness for at least 24 hours.  If you smoke, do not smoke without supervision.  If you are feeling better, you may resume normal activities 24 hours after you were sedated.  Keep all appointments with your  health care provider. SEEK MEDICAL CARE IF:  Your skin is pale or bluish in color.  You continue to feel nauseous or vomit.  Your pain is getting worse and is not helped by medicine.  You have bleeding or swelling.  You are still sleepy or feeling clumsy after 24 hours. SEEK IMMEDIATE MEDICAL CARE IF:  You develop a rash.  You have difficulty breathing.  You develop any type of allergic problem.  You have a fever. MAKE SURE YOU:  Understand these instructions.  Will watch your condition.  Will get help right away if you are not doing well or get worse. Document Released: 04/18/2013 Document Reviewed: 04/18/2013 [facility] Patient Information 2015 [place], [place]. This information is not intended to replace advice given to you by your health care provider. Make sure you discuss any questions you have with your health care provider.

## 2014-12-17 NOTE — Progress Notes (Signed)
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  IMRT was chosen to decrease the risk for significant head and neck morbidity including blindness, brain injury, brain stem/spinal cord injury, osteoradionecrosis of the mandible, and mucositis.  Dose volume histograms were obtained for the target structures including his primary tumor PTV and left right neck PTV's in addition to avoidance structures including the optic chiasm, temporal lobes, right cochlea, larynx/glottis, and oral cavity.  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  He'll be treated with 6MV photons with helical IMRT Tomotherapy.  The primary tumor will receive 7000 cGy in 35 sessions.

## 2014-12-19 ENCOUNTER — Ambulatory Visit (HOSPITAL_BASED_OUTPATIENT_CLINIC_OR_DEPARTMENT_OTHER): Payer: Medicare Other | Admitting: Hematology and Oncology

## 2014-12-19 ENCOUNTER — Encounter: Payer: Self-pay | Admitting: *Deleted

## 2014-12-19 ENCOUNTER — Other Ambulatory Visit: Payer: Medicare Other

## 2014-12-19 ENCOUNTER — Other Ambulatory Visit (HOSPITAL_BASED_OUTPATIENT_CLINIC_OR_DEPARTMENT_OTHER): Payer: Medicare Other

## 2014-12-19 ENCOUNTER — Encounter: Payer: Self-pay | Admitting: Hematology and Oncology

## 2014-12-19 ENCOUNTER — Telehealth: Payer: Self-pay | Admitting: *Deleted

## 2014-12-19 ENCOUNTER — Telehealth: Payer: Self-pay | Admitting: Hematology and Oncology

## 2014-12-19 VITALS — BP 142/81 | HR 80 | Temp 97.7°F | Resp 18 | Ht 68.0 in | Wt 173.8 lb

## 2014-12-19 DIAGNOSIS — E1129 Type 2 diabetes mellitus with other diabetic kidney complication: Secondary | ICD-10-CM | POA: Insufficient documentation

## 2014-12-19 DIAGNOSIS — C119 Malignant neoplasm of nasopharynx, unspecified: Secondary | ICD-10-CM

## 2014-12-19 DIAGNOSIS — Z931 Gastrostomy status: Secondary | ICD-10-CM

## 2014-12-19 DIAGNOSIS — F418 Other specified anxiety disorders: Secondary | ICD-10-CM | POA: Diagnosis not present

## 2014-12-19 DIAGNOSIS — E1122 Type 2 diabetes mellitus with diabetic chronic kidney disease: Secondary | ICD-10-CM | POA: Diagnosis not present

## 2014-12-19 DIAGNOSIS — N182 Chronic kidney disease, stage 2 (mild): Secondary | ICD-10-CM

## 2014-12-19 DIAGNOSIS — F419 Anxiety disorder, unspecified: Secondary | ICD-10-CM | POA: Insufficient documentation

## 2014-12-19 LAB — CBC WITH DIFFERENTIAL/PLATELET
BASO%: 0.3 % (ref 0.0–2.0)
BASOS ABS: 0 10*3/uL (ref 0.0–0.1)
EOS ABS: 0.2 10*3/uL (ref 0.0–0.5)
EOS%: 2.2 % (ref 0.0–7.0)
HCT: 41.2 % (ref 38.4–49.9)
HEMOGLOBIN: 13.9 g/dL (ref 13.0–17.1)
LYMPH%: 7.5 % — ABNORMAL LOW (ref 14.0–49.0)
MCH: 28.9 pg (ref 27.2–33.4)
MCHC: 33.8 g/dL (ref 32.0–36.0)
MCV: 85.4 fL (ref 79.3–98.0)
MONO#: 0.8 10*3/uL (ref 0.1–0.9)
MONO%: 8.7 % (ref 0.0–14.0)
NEUT#: 7.5 10*3/uL — ABNORMAL HIGH (ref 1.5–6.5)
NEUT%: 81.3 % — AB (ref 39.0–75.0)
Platelets: 223 10*3/uL (ref 140–400)
RBC: 4.82 10*6/uL (ref 4.20–5.82)
RDW: 13.4 % (ref 11.0–14.6)
WBC: 9.3 10*3/uL (ref 4.0–10.3)
lymph#: 0.7 10*3/uL — ABNORMAL LOW (ref 0.9–3.3)

## 2014-12-19 LAB — COMPREHENSIVE METABOLIC PANEL (CC13)
ALK PHOS: 77 U/L (ref 40–150)
ALT: 16 U/L (ref 0–55)
AST: 21 U/L (ref 5–34)
Albumin: 3.4 g/dL — ABNORMAL LOW (ref 3.5–5.0)
Anion Gap: 11 mEq/L (ref 3–11)
BILIRUBIN TOTAL: 0.52 mg/dL (ref 0.20–1.20)
BUN: 25.1 mg/dL (ref 7.0–26.0)
CO2: 25 meq/L (ref 22–29)
CREATININE: 1.7 mg/dL — AB (ref 0.7–1.3)
Calcium: 9.6 mg/dL (ref 8.4–10.4)
Chloride: 102 mEq/L (ref 98–109)
EGFR: 41 mL/min/{1.73_m2} — ABNORMAL LOW (ref >90)
GLUCOSE: 201 mg/dL — AB (ref 70–140)
Potassium: 4.5 mEq/L (ref 3.5–5.1)
SODIUM: 138 meq/L (ref 136–145)
TOTAL PROTEIN: 7.2 g/dL (ref 6.4–8.3)

## 2014-12-19 LAB — MAGNESIUM (CC13): MAGNESIUM: 2.3 mg/dL (ref 1.5–2.5)

## 2014-12-19 MED ORDER — ONDANSETRON HCL 8 MG PO TABS: 8.0000 mg | ORAL_TABLET | Freq: Three times a day (TID) | ORAL | Status: DC | PRN

## 2014-12-19 MED ORDER — LIDOCAINE-PRILOCAINE 2.5-2.5 % EX CREA: TOPICAL_CREAM | CUTANEOUS | Status: DC

## 2014-12-19 MED ORDER — PROCHLORPERAZINE MALEATE 10 MG PO TABS: 10.0000 mg | ORAL_TABLET | Freq: Four times a day (QID) | ORAL | Status: DC | PRN

## 2014-12-19 NOTE — Progress Notes (Signed)
RECEIVED A FAX FROM [facility] CONCERNING A PRIOR AUTHORIZATION FOR ONDANSETRON. THIS REQUEST WAS PLACED IN THE MANAGED CARE BIN.

## 2014-12-19 NOTE — Assessment & Plan Note (Addendum)
We discussed the role of chemotherapy. The intent is for cure.  We discussed some of the risks, benefits, side-effects of  cisplatin. Some of the short term side-effects included, though not limited to, including weight loss, life threatening infections, risk of allergic reactions, need for transfusions of blood products, nausea, vomiting, change in bowel habits, loss of hair, admission to hospital for various reasons, and risks of death.   Long term side-effects are also discussed including risks of infertility, permanent damage to nerve function, hearing loss, chronic fatigue, kidney damage with possibility needing hemodialysis, and rare secondary malignancy including bone marrow disorders. Due to potential risk of kidney injury, I told him to discontinue the losartan, his cholesterol medication, and his diabetes medication.  The patient is aware that the response rates discussed earlier is not guaranteed.  After a long discussion, patient made an informed decision to proceed with the prescribed plan of care and went ahead to sign the consent form today.   Patient education material was dispensed. Due to reduced kidney function, as long as his creatinine is under 2, I will proceed with weekly cisplatin. I will dose reduce dose by 25% and advocate aggressive fluid hydration Setting on week 3 of therapy, he will receive IV fluid support daily

## 2014-12-19 NOTE — Progress Notes (Signed)
Oncology Nurse Navigator Documentation  Oncology Nurse Navigator Flowsheets 12/19/2014  Referral date to RadOnc/MedOnc   Navigator Encounter Type Clinic/MDC  Patient Visit Type Medonc  To provide support and encouragement, care continuity and to assess for needs, met with patient during est pt appt with Dr. Alvy Bimler.  His wife accompanied him.  He confirmed his attendance at [facility] prior to this appt, stated information is helpful. They reported visit by home health but that supplies for PEG were not provided.   I provided them the following supplies - split gauze, Medipore tape, applicators, bottle of saline, syringe.  He stated he is flushing PEG daily, using syringe rather than gravity feed.  He reported pain at PEG insertion but is taking medication only before bedtime to facilitate sleep. He reiterated he is VERY anxious about starting RT next Monday, his ability to swallow while prone and wearing mask.  We discussed my previously communicated guidance from Dr. Valere Dross to take $Remov'2mg'piPqRJ$  Ativan prior to tmt.    His wife indicated Benadryl provides a sedative effect.    Dr. Alvy Bimler concurred with his taking OTC Benadryl along with 4 mg dilaudid prior to tmt.   They agree this is a reasonable approach for first day tmt, that alternate strategies will be considered if necessary. Patient understands I can be contacted with needs/concerns.     Barriers/Navigation Needs -  Interventions -  Time Spent with Patient Roosevelt, RN, BSN, [place] [facility] [place] [phone]

## 2014-12-19 NOTE — Assessment & Plan Note (Signed)
His diabetes is reasonably controlled. I recommend holding off Amaryl for now.

## 2014-12-19 NOTE — Assessment & Plan Note (Signed)
Feeding tube site looks okay. Home health has been consulted.

## 2014-12-19 NOTE — Telephone Encounter (Signed)
Pt confirmed labs/ov per 06/09 POF, gave pt AVS and Calendar.... KJ, sent msg to add IVF.Marland Kitchen

## 2014-12-19 NOTE — Telephone Encounter (Signed)
Call received from [facility] with [facility] in reference to ondansetron prior authorization.  Call lost with transfer to [place] but will notify to contact for this request.

## 2014-12-19 NOTE — Progress Notes (Signed)
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    INTERVAL HISTORY: Please see below for problem oriented charting. He returns for further follow-up and for chemotherapy consent He tolerated recent placement of port and feeding tube well Denies new side effects. He had concern about facemasks prior to radiation due to chronic anxiety REVIEW OF SYSTEMS:   Constitutional: Denies fevers, chills or abnormal weight loss Eyes: Denies blurriness of vision Ears, nose, mouth, throat, and face: Denies mucositis or sore throat Respiratory: Denies cough, dyspnea or wheezes Cardiovascular: Denies palpitation, chest discomfort or lower extremity swelling Gastrointestinal:  Denies nausea, heartburn or change in bowel habits Skin: Denies abnormal skin rashes Lymphatics: Denies new lymphadenopathy or easy bruising Neurological:Denies numbness, tingling or new weaknesses Behavioral/Psych: Mood is stable, no new changes  All other systems were reviewed with the patient and are negative.  I have reviewed the past medical history, past surgical history, social history and family history with the patient and they are unchanged from previous note.  ALLERGIES:  has No Known  Allergies.  MEDICATIONS:  Current Outpatient Prescriptions  Medication Sig Dispense Refill  . acetaminophen (TYLENOL) 500 MG tablet Take 500-1,000 mg by mouth every 6 (six) hours as needed for mild pain.    Marland Kitchen amLODipine (NORVASC) 5 MG tablet Take 5 mg by mouth every morning.     Marland Kitchen glimepiride (AMARYL) 2 MG tablet Take 2 mg by mouth every evening.   4  . LORazepam (ATIVAN) 1 MG tablet Take 1 tablet (1 mg total) by mouth 2 (two) times daily as needed for anxiety. 10 tablet 0  . Multiple Vitamins-Minerals (CENTRUM SILVER PO) Take by mouth daily.    . Omega-3 Fatty Acids (FISH OIL PO) Take by mouth daily.    Marland Kitchen oxyCODONE-acetaminophen (PERCOCET) 5-325 MG per tablet Take one or two tablets by mouth every 4-6 hours as needed for pain. 50 tablet 0  . lidocaine-prilocaine (EMLA) cream Apply to affected area once 30 g 3  . ondansetron (ZOFRAN) 8 MG tablet Take 1 tablet (8 mg total) by mouth every 8 (eight) hours as needed. 60 tablet 1  . prochlorperazine (COMPAZINE) 10 MG tablet Take 1 tablet (10 mg total) by mouth every 6 (six) hours as needed (Nausea or vomiting). 60 tablet 1   No current facility-administered medications for this visit.    PHYSICAL EXAMINATION: ECOG PERFORMANCE STATUS: 0 - Asymptomatic  Filed Vitals:   12/19/14 1415  BP: 142/81  Pulse: 80  Temp: 97.7 F (36.5 C)  Resp: 18   Filed Weights   12/19/14 1415  Weight: 173 lb 12.8 oz (78.835 kg)    GENERAL:alert, no distress and comfortable SKIN: skin color, texture, turgor are normal, no rashes or significant lesions EYES: normal, Conjunctiva are pink and non-injected, sclera clear OROPHARYNX:no exudate, no erythema and lips, buccal mucosa, and tongue normal  NECK:  supple, thyroid normal size, non-tender, without nodularity LYMPH:  no palpable lymphadenopathy in the cervical, axillary or inguinal LUNGS: clear to auscultation and percussion with normal breathing effort HEART: regular rate & rhythm and no murmurs and no  lower extremity edema ABDOMEN:abdomen soft, non-tender and normal bowel sounds. Feeding tube site looks okay Musculoskeletal:no cyanosis of digits and no clubbing . Port site looks okay NEURO: alert & oriented x 3 with fluent speech, no focal motor/sensory deficits  LABORATORY DATA:  I have reviewed the data as listed    Component Value Date/Time   NA 138 12/19/2014 1153   K 4.5 12/19/2014 1153   CO2 25 12/19/2014 1153   GLUCOSE 201* 12/19/2014 1153   BUN 25.1 12/19/2014 1153   CREATININE 1.7* 12/19/2014 1153   CALCIUM 9.6 12/19/2014 1153   PROT 7.2 12/19/2014 1153   ALBUMIN 3.4* 12/19/2014 1153   AST 21 12/19/2014 1153   ALT 16 12/19/2014 1153   ALKPHOS 77 12/19/2014 1153   BILITOT 0.52 12/19/2014 1153    No results found for: SPEP, UPEP  Lab Results  Component Value Date   WBC 9.3 12/19/2014   NEUTROABS 7.5* 12/19/2014   HGB 13.9 12/19/2014   HCT 41.2 12/19/2014   MCV 85.4 12/19/2014   PLT 223 12/19/2014      Chemistry      Component Value Date/Time   NA 138 12/19/2014 1153   K 4.5 12/19/2014 1153   CO2 25 12/19/2014 1153   BUN 25.1 12/19/2014 1153   CREATININE 1.7* 12/19/2014 1153      Component Value Date/Time   CALCIUM 9.6 12/19/2014 1153   ALKPHOS 77 12/19/2014 1153   AST 21 12/19/2014 1153   ALT 16 12/19/2014 1153   BILITOT 0.52 12/19/2014 1153       RADIOGRAPHIC STUDIES: I reviewed the MRI and PET CT scan with him and his wife I have personally reviewed the radiological images as listed and agreed with the findings in the report.  ASSESSMENT & PLAN:  Nasopharyngeal cancer We discussed the role of chemotherapy. The intent is for cure.  We discussed some of the risks, benefits, side-effects of  cisplatin. Some of the short term side-effects included, though not limited to, including weight loss, life threatening infections, risk of allergic reactions, need for transfusions of blood products, nausea, vomiting, change in bowel habits, loss of hair,  admission to hospital for various reasons, and risks of death.   Long term side-effects are also discussed including risks of infertility, permanent damage to nerve function, hearing loss, chronic fatigue, kidney damage with possibility needing hemodialysis, and rare secondary malignancy including bone marrow disorders. Due to potential risk of kidney injury, I told him to discontinue the losartan, his cholesterol medication, and his diabetes medication.  The patient is aware that the response rates discussed earlier is not guaranteed.  After a long discussion, patient made an informed decision to proceed with the prescribed plan of care and went ahead to sign the consent form today.   Patient education material was dispensed. Due to reduced kidney function, as long as his creatinine is under 2, I will proceed with weekly cisplatin. I will dose reduce dose by 25% and advocate aggressive fluid hydration Setting on week 3 of therapy, he will receive IV fluid support daily  Chronic anxiety He is prescribed daily lorazepam 2 mg prior to each radiation therapy. Continue the same.  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 He can proceed with chemotherapy as long as creatinine remains on the 2  Type 2 diabetes mellitus, controlled, with renal complications His diabetes is reasonably controlled. I recommend holding off Amaryl for now.  S/P gastrostomy Feeding tube site looks okay. Home health has been consulted.     Orders Placed This Encounter  Procedures  . PHYSICIAN COMMUNICATION ORDER    A baseline Audiogram is recommended prior to initiation of cisplatin chemotherapy.   All questions were answered. The patient knows to call the clinic with any problems, questions or  concerns. No barriers to learning was detected. I spent 30 minutes counseling the patient face to face. The total time spent in the appointment was 40 minutes and more than 50% was on counseling and review of test results     [facility], [place], MD 12/19/2014 4:40 PM

## 2014-12-19 NOTE — Progress Notes (Signed)
I faxed prior auth for ondansetron 8mg  tablet to [facility].

## 2014-12-19 NOTE — Assessment & Plan Note (Signed)
He has reduced creatinine clearance which is chronic I am willing to proceed with chemotherapy, dose adjustment by reducing chemotherapy by 25% weekly and aggressive fluid hydration. He can proceed with chemotherapy as long as creatinine remains on the 2

## 2014-12-19 NOTE — Assessment & Plan Note (Signed)
He is prescribed daily lorazepam 2 mg prior to each radiation therapy. Continue the same.

## 2014-12-20 ENCOUNTER — Encounter: Payer: Self-pay | Admitting: Hematology and Oncology

## 2014-12-20 ENCOUNTER — Telehealth: Payer: Self-pay | Admitting: Hematology and Oncology

## 2014-12-20 ENCOUNTER — Other Ambulatory Visit: Payer: Self-pay | Admitting: Hematology and Oncology

## 2014-12-20 NOTE — Progress Notes (Signed)
Per Ulice Dash. Ondansetron has been approved 12/19/14-12/19/15.

## 2014-12-20 NOTE — Telephone Encounter (Signed)
lvm for pt regarding to added appt...advised pt to get new sched on 6.13

## 2014-12-23 ENCOUNTER — Other Ambulatory Visit (HOSPITAL_COMMUNITY): Payer: Medicare Other

## 2014-12-23 ENCOUNTER — Ambulatory Visit
Admission: RE | Admit: 2014-12-23 | Discharge: 2014-12-23 | Disposition: A | Discharge status: Home / Self Care | Payer: Medicare Other | Source: Ambulatory Visit | Attending: Radiation Oncology | Admitting: Radiation Oncology

## 2014-12-23 ENCOUNTER — Ambulatory Visit: Payer: Medicare Other

## 2014-12-23 ENCOUNTER — Encounter: Payer: Self-pay | Admitting: Radiation Oncology

## 2014-12-23 ENCOUNTER — Encounter: Payer: Self-pay | Admitting: *Deleted

## 2014-12-23 ENCOUNTER — Ambulatory Visit (HOSPITAL_BASED_OUTPATIENT_CLINIC_OR_DEPARTMENT_OTHER): Payer: Medicare Other

## 2014-12-23 ENCOUNTER — Ambulatory Visit: Payer: Medicare Other | Admitting: Nutrition

## 2014-12-23 ENCOUNTER — Encounter: Payer: Self-pay | Admitting: General Practice

## 2014-12-23 ENCOUNTER — Ambulatory Visit (HOSPITAL_COMMUNITY): Payer: Medicare Other

## 2014-12-23 VITALS — BP 162/53 | HR 62 | Temp 97.9°F | Resp 17

## 2014-12-23 VITALS — BP 136/64 | HR 72 | Temp 97.7°F | Ht 68.0 in | Wt 175.1 lb

## 2014-12-23 DIAGNOSIS — C119 Malignant neoplasm of nasopharynx, unspecified: Secondary | ICD-10-CM

## 2014-12-23 DIAGNOSIS — Z5111 Encounter for antineoplastic chemotherapy: Secondary | ICD-10-CM | POA: Diagnosis not present

## 2014-12-23 MED ORDER — SODIUM CHLORIDE 0.9 % IV SOLN
Freq: Once | INTRAVENOUS | Status: AC
  Administered 2014-12-23: 08:00:00 via INTRAVENOUS

## 2014-12-23 MED ORDER — POTASSIUM CHLORIDE 2 MEQ/ML IV SOLN
Freq: Once | INTRAVENOUS | Status: AC
  Administered 2014-12-23: 09:00:00 via INTRAVENOUS
  Filled 2014-12-23: qty 10

## 2014-12-23 MED ORDER — SODIUM CHLORIDE 0.9 % IJ SOLN
10.0000 mL | INTRAMUSCULAR | Status: DC | PRN
  Administered 2014-12-23: 10 mL
  Filled 2014-12-23: qty 10

## 2014-12-23 MED ORDER — PALONOSETRON HCL INJECTION 0.25 MG/5ML
0.2500 mg | Freq: Once | INTRAVENOUS | Status: AC
  Administered 2014-12-23: 0.25 mg via INTRAVENOUS

## 2014-12-23 MED ORDER — SODIUM CHLORIDE 0.9 % IV SOLN
30.0000 mg/m2 | Freq: Once | INTRAVENOUS | Status: AC
  Administered 2014-12-23: 59 mg via INTRAVENOUS
  Filled 2014-12-23: qty 59

## 2014-12-23 MED ORDER — SODIUM CHLORIDE 0.9 % IV SOLN
Freq: Once | INTRAVENOUS | Status: AC
  Administered 2014-12-23: 11:00:00 via INTRAVENOUS
  Filled 2014-12-23: qty 5

## 2014-12-23 MED ORDER — HEPARIN SOD (PORK) LOCK FLUSH 100 UNIT/ML IV SOLN
500.0000 [IU] | Freq: Once | INTRAVENOUS | Status: AC | PRN
  Administered 2014-12-23: 500 [IU]
  Filled 2014-12-23: qty 5

## 2014-12-23 MED ORDER — PALONOSETRON HCL INJECTION 0.25 MG/5ML
INTRAVENOUS | Status: AC
  Filled 2014-12-23: qty 5

## 2014-12-23 MED ORDER — LORAZEPAM 1 MG PO TABS: 1.0000 mg | ORAL_TABLET | Freq: Two times a day (BID) | ORAL | Status: DC | PRN

## 2014-12-23 NOTE — Patient Instructions (Signed)
[facility] Discharge Instructions for Patients Receiving Chemotherapy  Today you received the following chemotherapy agents: Cisplatin.   To help prevent nausea and vomiting after your treatment, we encourage you to take your nausea medication as directed.  Do not take Zofran for three days after treatment.  May take Compazine 10 MG every six hours as needed.  If you develop nausea and vomiting that is not controlled by your nausea medication, call the clinic.   BELOW ARE SYMPTOMS THAT SHOULD BE REPORTED IMMEDIATELY:  *FEVER GREATER THAN 100.5 F  *CHILLS WITH OR WITHOUT FEVER  NAUSEA AND VOMITING THAT IS NOT CONTROLLED WITH YOUR NAUSEA MEDICATION  *UNUSUAL SHORTNESS OF BREATH  *UNUSUAL BRUISING OR BLEEDING  TENDERNESS IN MOUTH AND THROAT WITH OR WITHOUT PRESENCE OF ULCERS  *URINARY PROBLEMS  *BOWEL PROBLEMS  UNUSUAL RASH Items with * indicate a potential emergency and should be followed up as soon as possible.  Feel free to call the clinic you have any questions or concerns. The clinic phone number is [phone].  Please show the [place] at check-in to the Emergency Department and triage nurse.   Cisplatin injection What is this medicine? CISPLATIN (SIS pla tin) is a chemotherapy drug. It targets fast dividing cells, like cancer cells, and causes these cells to die. This medicine is used to treat many types of cancer like bladder, ovarian, and testicular cancers. This medicine may be used for other purposes; ask your health care provider or pharmacist if you have questions. COMMON BRAND NAME(S): Platinol, Platinol -AQ What should I tell my health care provider before I take this medicine? They need to know if you have any of these conditions: -blood disorders -hearing problems -kidney disease -recent or ongoing radiation therapy -an unusual or allergic reaction to cisplatin, carboplatin, other chemotherapy, other medicines, foods, dyes, or  preservatives -pregnant or trying to get pregnant -breast-feeding How should I use this medicine? This drug is given as an infusion into a vein. It is administered in a hospital or clinic by a specially trained health care professional. Talk to your pediatrician regarding the use of this medicine in children. Special care may be needed. Overdosage: If you think you have taken too much of this medicine contact a poison control center or emergency room at once. NOTE: This medicine is only for you. Do not share this medicine with others. What if I miss a dose? It is important not to miss a dose. Call your doctor or health care professional if you are unable to keep an appointment. What may interact with this medicine? -dofetilide -foscarnet -medicines for seizures -medicines to increase blood counts like filgrastim, pegfilgrastim, sargramostim -probenecid -pyridoxine used with altretamine -rituximab -some antibiotics like amikacin, gentamicin, neomycin, polymyxin B, streptomycin, tobramycin -sulfinpyrazone -vaccines -zalcitabine Talk to your doctor or health care professional before taking any of these medicines: -acetaminophen -aspirin -ibuprofen -ketoprofen -naproxen This list may not describe all possible interactions. Give your health care provider a list of all the medicines, herbs, non-prescription drugs, or dietary supplements you use. Also tell them if you smoke, drink alcohol, or use illegal drugs. Some items may interact with your medicine. What should I watch for while using this medicine? Your condition will be monitored carefully while you are receiving this medicine. You will need important blood work done while you are taking this medicine. This drug may make you feel generally unwell. This is not uncommon, as chemotherapy can affect healthy cells as well as cancer cells.  Report any side effects. Continue your course of treatment even though you feel ill unless your doctor  tells you to stop. In some cases, you may be given additional medicines to help with side effects. Follow all directions for their use. Call your doctor or health care professional for advice if you get a fever, chills or sore throat, or other symptoms of a cold or flu. Do not treat yourself. This drug decreases your body's ability to fight infections. Try to avoid being around people who are sick. This medicine may increase your risk to bruise or bleed. Call your doctor or health care professional if you notice any unusual bleeding. Be careful brushing and flossing your teeth or using a toothpick because you may get an infection or bleed more easily. If you have any dental work done, tell your dentist you are receiving this medicine. Avoid taking products that contain aspirin, acetaminophen, ibuprofen, naproxen, or ketoprofen unless instructed by your doctor. These medicines may hide a fever. Do not become pregnant while taking this medicine. Women should inform their doctor if they wish to become pregnant or think they might be pregnant. There is a potential for serious side effects to an unborn child. Talk to your health care professional or pharmacist for more information. Do not breast-feed an infant while taking this medicine. Drink fluids as directed while you are taking this medicine. This will help protect your kidneys. Call your doctor or health care professional if you get diarrhea. Do not treat yourself. What side effects may I notice from receiving this medicine? Side effects that you should report to your doctor or health care professional as soon as possible: -allergic reactions like skin rash, itching or hives, swelling of the face, lips, or tongue -signs of infection - fever or chills, cough, sore throat, pain or difficulty passing urine -signs of decreased platelets or bleeding - bruising, pinpoint red spots on the skin, black, tarry stools, nosebleeds -signs of decreased red blood  cells - unusually weak or tired, fainting spells, lightheadedness -breathing problems -changes in hearing -gout pain -low blood counts - This drug may decrease the number of white blood cells, red blood cells and platelets. You may be at increased risk for infections and bleeding. -nausea and vomiting -pain, swelling, redness or irritation at the injection site -pain, tingling, numbness in the hands or feet -problems with balance, movement -trouble passing urine or change in the amount of urine Side effects that usually do not require medical attention (report to your doctor or health care professional if they continue or are bothersome): -changes in vision -loss of appetite -metallic taste in the mouth or changes in taste This list may not describe all possible side effects. Call your doctor for medical advice about side effects. You may report side effects to FDA at 1-800-FDA-1088. Where should I keep my medicine? This drug is given in a hospital or clinic and will not be stored at home. NOTE: This sheet is a summary. It may not cover all possible information. If you have questions about this medicine, talk to your doctor, pharmacist, or health care provider.  2015, Elsevier/Gold Standard. (2007-10-03 14:40:54)

## 2014-12-23 NOTE — Progress Notes (Signed)
   Weekly Management Note:  Outpatient    ICD-9-CM ICD-10-CM   1. Nasopharyngeal cancer 147.9 C11.9 LORazepam (ATIVAN) 1 MG tablet    Current Dose:  2 Gy  Projected Dose: 70 Gy   Narrative:  The patient presents for routine under treatment assessment.  CBCT/MVCT images/Port film x-rays were reviewed.  The chart was checked.  Just completed 1st chemotherapy treatment and is very drowsy. Given RadiationTherapy and You booklet with appropriate pages marked for him and his family to review, then educated this tomorrow. Given Biafine with skin care instruction sheet. He is currently on Ativan (needs refill) and with a family member.   Physical Findings:  height is 5\' 8"  (1.727 m) and weight is 175 lb 1.6 oz (79.425 kg). His temperature is 97.7 F (36.5 C). His blood pressure is 136/64 and his pulse is 72.   Wt Readings from Last 3 Encounters:  12/23/14 175 lb 1.6 oz (79.425 kg)  12/19/14 173 lb 12.8 oz (78.835 kg)  12/17/14 175 lb (79.379 kg)   NAD.  No mucositis or thrush.   CBC    Component Value Date/Time   WBC 9.3 12/19/2014 1153   WBC 5.3 12/17/2014 0938   RBC 4.82 12/19/2014 1153   RBC 4.60 12/17/2014 0938   HGB 13.9 12/19/2014 1153   HGB 13.4 12/17/2014 0938   HCT 41.2 12/19/2014 1153   HCT 39.1 12/17/2014 0938   PLT 223 12/19/2014 1153   PLT 212 12/17/2014 0938   MCV 85.4 12/19/2014 1153   MCV 85.0 12/17/2014 0938   MCH 28.9 12/19/2014 1153   MCH 29.1 12/17/2014 0938   MCHC 33.8 12/19/2014 1153   MCHC 34.3 12/17/2014 0938   RDW 13.4 12/19/2014 1153   RDW 13.0 12/17/2014 0938   LYMPHSABS 0.7* 12/19/2014 1153   LYMPHSABS 0.8 12/17/2014 0938   MONOABS 0.8 12/19/2014 1153   MONOABS 0.6 12/17/2014 0938   EOSABS 0.2 12/19/2014 1153   EOSABS 0.3 12/17/2014 0938   BASOSABS 0.0 12/19/2014 1153   BASOSABS 0.0 12/17/2014 0938    CMP     Component Value Date/Time   NA 138 12/19/2014 1153   K 4.5 12/19/2014 1153   CO2 25 12/19/2014 1153   GLUCOSE 201* 12/19/2014 1153    BUN 25.1 12/19/2014 1153   CREATININE 1.7* 12/19/2014 1153   CALCIUM 9.6 12/19/2014 1153   PROT 7.2 12/19/2014 1153   ALBUMIN 3.4* 12/19/2014 1153   AST 21 12/19/2014 1153   ALT 16 12/19/2014 1153   ALKPHOS 77 12/19/2014 1153   BILITOT 0.52 12/19/2014 1153     Impression:  The patient is tolerating radiotherapy.  Plan:  Continue radiotherapy as planned. Refilled Ativan.    This document serves as a record of services personally performed by Eppie Gibson, MD. It was created on her behalf by Arlyce Harman, a trained medical scribe. The creation of this record is based on the scribe's personal observations and the provider's statements to them. This document has been checked and approved by the attending provider. ________________________________   Eppie Gibson, M.D.

## 2014-12-23 NOTE — Progress Notes (Addendum)
Adam Yang has received 1 fraction to his Nasopharyngeal region and bilateral neck.  Just completed 1st chemotherapy treatment and is very drowsy. Given RadiationTherapy and You booklet with appropriate pages marked for he and his family to review, will educate tomorrow.  Given Biafine with skin care instruction sheet.

## 2014-12-23 NOTE — Progress Notes (Unsigned)
Oncology Nurse Navigator Documentation  Oncology Nurse Navigator Flowsheets 12/23/2014  Referral date to RadOnc/MedOnc -  Navigator Encounter Type Other  Delivered [facility] forms to [facility].  Patient Visit Type -  Treatment Phase -  Barriers/Navigation Needs -  Interventions -  Time Spent with Patient [place], RN, BSN, [place] at [place] [phone]

## 2014-12-23 NOTE — Progress Notes (Signed)
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 Patient is status post feeding tube placement on June 7. Patient reports he is flushing feeding tube with water twice a day. Patient is eating smaller amounts of food more often. Weight documented as 173.8 pounds on June 9.  Nutrition diagnosis: Predicted suboptimal energy intake continues.  Intervention: Provided support and encouragement for patient to continue healthy diet with small frequent meals and snacks that are high in protein. Enforced importance of flushing feeding tube with water daily. Questions were answered.  Teach back method used.  Monitoring, evaluation, goals: Patient will tolerate adequate calories and protein to minimize weight loss. Will begin feeding via PEG once weight drops 5% from usual body weight.  Next visit: Monday, June 20, during chemotherapy.  **Disclaimer: This note was dictated with voice recognition software. Similar sounding words can inadvertently be transcribed and this note may contain transcription errors which may not have been corrected upon publication of note.**

## 2014-12-23 NOTE — Progress Notes (Signed)
Oncology Nurse Navigator Documentation  Oncology Nurse Navigator Flowsheets 12/23/2014  Referral date to RadOnc/MedOnc -  Navigator Encounter Type Clinic/MDC  To provide support and encouragement, care continuity and to assess for needs, met with patient in Infusion where he was receiving initial chemo tmt.  His wife accompanied him. 1. He/his wife reported:  PEG - Discomfort at PEG insertion site resolving; using mesh brief to support/secure tube, showering without difficulty, flushing daily.  Purchased large water bottle with goal of 64 oz fluid daily. 2. New bed being delivered, one which head can be elevated.   3. We discussed again previously discussed pre-medication strategy for this afternoon's RT (2 mg Ativan, OTC Benadryl). 4. Wife provided Leave Request forms for husband.  I informed usual TA is 7-10 calendar days, that I would forward for Dr. Calton Dach processing. 5. They understand I will join them this afternoon for [employer].      Patient Visit Type Medonc  Treatment Phase First Chemo Tx  Barriers/Navigation Needs -  Interventions -  Time Spent with Patient [place], RN, BSN, [place] at [place] [phone]

## 2014-12-23 NOTE — Progress Notes (Signed)
OK to proceed if creatinine <2 per MD note due to current renal status. Patient voided 1475 cc prior to cisplatin.

## 2014-12-24 ENCOUNTER — Ambulatory Visit: Payer: Medicare Other

## 2014-12-24 ENCOUNTER — Encounter: Payer: Self-pay | Admitting: *Deleted

## 2014-12-24 ENCOUNTER — Ambulatory Visit
Admission: RE | Admit: 2014-12-24 | Discharge: 2014-12-24 | Disposition: A | Discharge status: Home / Self Care | Payer: Medicare Other | Source: Ambulatory Visit | Attending: Radiation Oncology | Admitting: Radiation Oncology

## 2014-12-24 ENCOUNTER — Encounter: Payer: Self-pay | Admitting: Hematology and Oncology

## 2014-12-24 ENCOUNTER — Encounter: Payer: Self-pay | Admitting: General Practice

## 2014-12-24 DIAGNOSIS — R1310 Dysphagia, unspecified: Secondary | ICD-10-CM

## 2014-12-24 DIAGNOSIS — C119 Malignant neoplasm of nasopharynx, unspecified: Secondary | ICD-10-CM | POA: Diagnosis not present

## 2014-12-24 DIAGNOSIS — M436 Torticollis: Secondary | ICD-10-CM | POA: Diagnosis not present

## 2014-12-24 NOTE — Patient Instructions (Signed)
SWALLOWING EXERCISES Do these 6 of the 7 days per week until  6 months after your last day of radiation, then x2-3 times per week afterwards  1. Effortful Swallows - Squeeze hard with the muscles in your neck while you swallow your  saliva or a sip of water - Repeat 20 times, 2-3 times a day, and use whenever you eat or drink  2. Masako Swallow - swallow with your tongue sticking out - Stick tongue out past your teeth and gently bite tongue with your teeth - Swallow, while holding your tongue with your teeth - Repeat 20 times, 2-3 times a day *use a wet spoon if your mouth gets dry*  3. Shaker Exercise - head lift - Lie flat on your back in your bed or on a couch without pillows - Raise your head and look at your feet - KEEP YOUR SHOULDERS DOWN - HOLD FOR one SECOND, then lower your head back down - Repeat 30 times, 2 times a day  4. Mendelsohn Maneuver - "half swallow" exercise - Start to swallow, and keep your Adam's apple up by squeezing hard with the muscles of the throat - Hold the squeeze for 5-7 seconds and then relax - Repeat 20 times, 2-3 times a day *use a wet spoon if your mouth gets dry*  5. Tongue Press - Press your entire tongue as hard as you can against the roof of your mouth for 3-5 seconds - Repeat 20 times, 2-3 times a day  6. Breath Hold - Say "HUH!" loudly, then hold your breath for 3 seconds at your voice box - Repeat 20 times, 2-3 times a day  7. Chin pushback - Open your mouth  - Place your fist UNDER your chin near your neck, and push back with your fist for 5 seconds - Repeat 10 times, 2-3 times a day

## 2014-12-24 NOTE — Progress Notes (Signed)
Oncology Nurse Navigator Documentation  Oncology Nurse Navigator Flowsheets 12/23/2014  Referral date to RadOnc/MedOnc -  Navigator Encounter Type Other  Patient Visit Type Radonc  Treatment Phase First Radiation Tx  To provide support, encouragement and care continuity, met with patient during [place].  His wife accompanied him.  In context of patient's expressed concerns about first tmt, I arranged with Chaplain Lorrin Jackson and LCSW Polo Riley to be present to provide additional support for patient and his wife.  Patient reported he had taken $RemoveB'2mg'lgWlpTSs$  Ativan and OTC Benadryl about 15 minutes prior to arriving in RadOnc Waiting.  He stated he was somewhat relaxed but still very anxious.  His wife watched tmt process, therapist explained activity.  She indicated this was helpful for her.  With support from RTTs and Lattie Haw, and this navigator, patient completed his first tmt without incident. Staff recognized his accomplishment.  He understands that future tmts will not take as long as today's [employer].   I discussed with him repeating the medication regime for tomorrow's tmt since it proved beneficial today.  I suggested that he may find he can alter medication as tmts proceed and his level of comfort/confidence with tmt process progresses.  He verbalized agreement.  He understands that Lattie Haw and I will join him for tomorrow's tmt.  Barriers/Navigation Needs -  Interventions -  Time Spent with Patient Lee, RN, BSN, [place] at [place] [phone]

## 2014-12-24 NOTE — Progress Notes (Signed)
Oncology Nurse Navigator Documentation  Oncology Nurse Navigator Flowsheets 12/24/2014  Referral date to RadOnc/MedOnc -  Navigator Encounter Type Treatment  Patient Visit Type Radonc  To provide support, encouragement, care continuity, met with patient for his 2nd RT.  His wife accompanied him.  Chaplain Lorrin Jackson was present to provide support to both patient and wife.  Patient reported he had taken 5m Ativan and Benadryl a short while ago. He completed tmt without incident.   Treatment Phase -  Barriers/Navigation Needs -  Interventions -  Time Spent with Patient 4Hardesty RN, BSN, [phone]

## 2014-12-24 NOTE — Progress Notes (Signed)
I placed fmla forms on desk of nurse for dr. Alvy Bimler.

## 2014-12-24 NOTE — Progress Notes (Signed)
Spiritual Care Note  Consulted by Gayleen Orem, RN, head/neck navigator, to create support plan for pt, who goes by "Adam Yang," and his wife as they navigated anxiety about first treatment.  [name] particularly named the anxiety about the confinement of the mask and difficulty swallowing--especially when his mouth was dry from stress and he was secured supine to the table.  Coordinated emotional support with Polo Riley, LCSW, who supported Ms Layton while I sat with Adam Yang before and between treatments to provide pastoral, less-anxious presence and coping techniques.  He verbalized appreciation for the opportunity to talk and process as a distraction prior to the procedure, without having to answer questions or listen to someone else.  Holding his hand or placing a calming hand on his foot helped him stay relaxed and grounded.  Seeing his success at completing the first treatment (longer time in mask than with subsequent treatments because of initial scan) seemed to steady his nerves and bolster his confidence.  I plan to be present with him today for further support and encouragement as he desires.  Yesterday his talking beforehand seemed to be very helpful, but I can introduce additional tools such as progressive relaxation and guided imagery, as need indicates.  Also plan to encourage him to share and process today what was helpful yesterday, both to reiterate his success and to the care team know how to support him best.  Will follow as needed through treatments, but please also page as needs arise.  Thank you.  [name], [name]

## 2014-12-24 NOTE — Therapy (Signed)
[facility] [address] [place], [place], [address] Phone: [phone]   Fax:  [phone]  Speech Language Pathology Evaluation  Patient Details  Name: Adam Yang MRN: [id number] Date of Birth: May 09, 1946 Referring Provider:  Lona Kettle, MD  Encounter Date: 12/24/2014      End of Session - 12/24/14 0844    Visit Number 1   Number of Visits 3   Date for SLP Re-Evaluation 02/22/15   SLP Start Time 0806   SLP Stop Time  0844   SLP Time Calculation (min) 38 min   Activity Tolerance Patient tolerated treatment well      Past Medical History  Diagnosis Date  . Hyperlipidemia   . Hypertension   . Complication of anesthesia   . PONV (postoperative nausea and vomiting)   . Diabetes mellitus without complication     Type II  . Chronic kidney disease     Renal Insuffiency , Creatinine has been in the 2- 3 range, 11/27/14 Creatine 1.4  . Gout   . Arthritis   . Anxiety     Claustrophobia    Past Surgical History  Procedure Laterality Date  . Lymph node biopsy    . Colonoscopy    . Knee surgery Left 1990's    tibia crushed, pinned and bone grafted  . Colonoscopy w/ polypectomy    . Multiple extractions with alveoloplasty N/A 12/06/2014    Procedure: Extraction of tooth #'s 2,4,5,6,7,8,9,10,11,12,13,14,15, 20,22,26,27,28,29 with alveoloplasty.;  Surgeon: Lenn Cal, DDS;  Location: [facility];  Service: Oral Surgery;  Laterality: N/A;    There were no vitals filed for this visit.  Visit Diagnosis: Dysphagia      Subjective Assessment - 12/24/14 0814    Subjective Pt denies symptoms of aspiration during meals currently.   Patient is accompained by: Family member            SLP Evaluation [facility] - 12/24/14 0815    SLP Visit Information   SLP Received On 12/24/14   Medical Diagnosis nasopharyngeal cancer   Pain Assessment   Currently in Pain? No/denies   Prior Functional Status   Cognitive/Linguistic Baseline Within functional limits   Cognition   Overall Cognitive Status Within Functional Limits for tasks assessed   Auditory Comprehension   Overall Auditory Comprehension Appears within functional limits for tasks assessed   Verbal Expression   Overall Verbal Expression Appears within functional limits for tasks assessed   Oral Motor/Sensory Function   Overall Oral Motor/Sensory Function Appears within functional limits for tasks assessed   Labial ROM Within Functional Limits   Labial Strength Within Functional Limits   Labial Coordination WFL   Lingual ROM Within Functional Limits   Lingual Symmetry Within Functional Limits   Lingual Strength Within Functional Limits   Lingual Coordination WFL   Facial ROM Within Functional Limits   Velum Within Functional Limits      Pt currently tolerates soft diet with thin liquids due to tooth extraction. POs: Pt ate cracker bits and drank water without overt s/s aspiration. Thyroid elevation appeared WNL, and swallows appeared timely. Oral residue noted as minimal/WNL. Pt's swallow deemed essentially WNL at this time.   Because data states the risk for dysphagia during and after radiation treatment is high due to undergoing radiation tx, SLP taught pt about the possibility of reduced/limited ability for PO intake during rad tx. SLP encouraged pt to continue swallowing POs as far into rad tx as possible, even ingesting POs  and/or completing HEP shortly after administration of pain meds.   SLP educated pt re: changes to swallowing musculature after rad tx, and why adherence to dysphagia HEP provided today and PO consumption was necessary to inhibit muscular disuse atrophy and to reduce muscle fibrosis following rad tx. Pt demonstrated understanding of these things to SLP. Further education was provided re: xerostomia, mucositis/esophagitis, and late effects head/neck radiation.   After eval tasks, SLP then developed a HEP for  pt, and pt was instructed how to perform exercises targeting lingual, vocal, and pharyngeal strengthening and use. SLP performed each exercise and pt return demonstrated each exercise. SLP ensured pt performance was correct prior to moving on to next exercise. Pt was instructed to complete this program 2-3 times a day, 6 of 7 days/week until 60 days after his last rad tx, then x2 a week after that.          SLP Education - 01/18/2015 [phone]    Education provided Yes   Education Details HEP, muscle fibrosis and atrophy   Person(s) Educated Patient;Spouse   Methods Explanation;Demonstration;Verbal cues   Comprehension Verbalized understanding;Returned demonstration;Verbal cues required          SLP Short Term Goals - 2015/01/18 0846    SLP SHORT TERM GOAL #1   Title pt will complete HEP with rare verbal cues   Time 1   Period --  visit   Status New   SLP SHORT TERM GOAL #2   Title pt will tell SLP why he is completing HEP   Time 1   Period --  visit   Status New          SLP Long Term Goals - 2015-01-18 0846    SLP LONG TERM GOAL #1   Title pt will tell SLP signs/symptoms aspiration PNA   Time 2   Period --  visits   Status New   SLP LONG TERM GOAL #2   Title pt will complete HEP wtih modified independence   Time 2   Period --  visits   Status New          Plan - 01/18/15 0844    Clinical Impression Statement Pt presents with swallowing essentially WNL at present. However pt will need skilled ST to monitor safety with POs during and after rad tx, and assessment of success with HEP procedure.   Speech Therapy Frequency --  approx every 4 weeks   Duration --  for 60 days   Treatment/Interventions Aspiration precaution training;Diet toleration management by SLP;Pharyngeal strengthening exercises;Oral motor exercises;Patient/family education;Compensatory strategies;SLP instruction and feedback   Potential to Achieve Goals Good          G-Codes - 01-18-2015 0847     Functional Assessment Tool Used noms   Functional Limitations Swallowing   Swallow Current Status [id number]) 0 percent impaired, limited or restricted   Swallow Goal Status (Z6606) At least 1 percent but less than 20 percent impaired, limited or restricted      Problem List Patient Active Problem List   Diagnosis Date Noted  . Chronic anxiety 12/19/2014  . 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 Type 2 diabetes mellitus, controlled, with renal complications 30/16/0109  . S/P gastrostomy 12/19/2014  . 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

## 2014-12-25 ENCOUNTER — Encounter: Payer: Self-pay | Admitting: Hematology and Oncology

## 2014-12-25 ENCOUNTER — Encounter: Payer: Self-pay | Admitting: *Deleted

## 2014-12-25 ENCOUNTER — Ambulatory Visit
Admission: RE | Admit: 2014-12-25 | Discharge: 2014-12-25 | Disposition: A | Discharge status: Home / Self Care | Payer: Medicare Other | Source: Ambulatory Visit | Attending: Radiation Oncology | Admitting: Radiation Oncology

## 2014-12-25 DIAGNOSIS — C119 Malignant neoplasm of nasopharynx, unspecified: Secondary | ICD-10-CM | POA: Diagnosis not present

## 2014-12-25 NOTE — Progress Notes (Signed)
Per [employer] lorazepam has been approved Effective from 12/25/2014 through 12/25/2015. CMM AUTO APPROVED FOR 12 MONTHS

## 2014-12-25 NOTE — Progress Notes (Signed)
Stopped by patient in [employer].  Spoke with his wife as well.  Pt is tolerating radiation treatment relatively well.  Pt is reporting that the treatment mask is bothersome to his port a cath site and was wondering if the mask could be cut the accommodate the site.  He also is requesting some more supplies to care for his gtube site.  Wife and patient very pleasant and tolerating the radiation schedule well. Notified radiation therapist of patient request and spoke with Surveyor, minerals.

## 2014-12-25 NOTE — Progress Notes (Signed)
I faxed prior auth for lorazepam to [employer]

## 2014-12-26 ENCOUNTER — Ambulatory Visit
Admission: RE | Admit: 2014-12-26 | Discharge: 2014-12-26 | Disposition: A | Discharge status: Home / Self Care | Payer: Medicare Other | Source: Ambulatory Visit | Attending: Radiation Oncology | Admitting: Radiation Oncology

## 2014-12-26 ENCOUNTER — Telehealth: Payer: Self-pay

## 2014-12-26 DIAGNOSIS — C119 Malignant neoplasm of nasopharynx, unspecified: Secondary | ICD-10-CM | POA: Diagnosis not present

## 2014-12-26 NOTE — Telephone Encounter (Signed)
-----   Message from Egbert Garibaldi, RN sent at 12/23/2014  3:09 PM EDT ----- Regarding: NG first time chemo followup call  NG patient. First time Cisplatin. Pt tolerated well.

## 2014-12-26 NOTE — Telephone Encounter (Signed)
lvm we are f/u on cisplatin from 6/13. We are interested in ability to eat and drink without nausea nor vomiting, no constipation nor diarrhea, no mouth sores. Instructed to call if any questions or concerns.

## 2014-12-27 ENCOUNTER — Other Ambulatory Visit (HOSPITAL_BASED_OUTPATIENT_CLINIC_OR_DEPARTMENT_OTHER): Payer: Medicare Other

## 2014-12-27 ENCOUNTER — Ambulatory Visit
Admission: RE | Admit: 2014-12-27 | Discharge: 2014-12-27 | Disposition: A | Discharge status: Home / Self Care | Payer: Medicare Other | Source: Ambulatory Visit | Attending: Radiation Oncology | Admitting: Radiation Oncology

## 2014-12-27 DIAGNOSIS — C119 Malignant neoplasm of nasopharynx, unspecified: Secondary | ICD-10-CM

## 2014-12-27 LAB — CBC WITH DIFFERENTIAL/PLATELET
BASO%: 0.2 % (ref 0.0–2.0)
Basophils Absolute: 0 10*3/uL (ref 0.0–0.1)
EOS ABS: 0.3 10*3/uL (ref 0.0–0.5)
EOS%: 3.9 % (ref 0.0–7.0)
HCT: 38.1 % — ABNORMAL LOW (ref 38.4–49.9)
HGB: 13 g/dL (ref 13.0–17.1)
LYMPH#: 1.3 10*3/uL (ref 0.9–3.3)
LYMPH%: 19.7 % (ref 14.0–49.0)
MCH: 29.3 pg (ref 27.2–33.4)
MCHC: 34.1 g/dL (ref 32.0–36.0)
MCV: 85.8 fL (ref 79.3–98.0)
MONO#: 0.7 10*3/uL (ref 0.1–0.9)
MONO%: 11.3 % (ref 0.0–14.0)
NEUT#: 4.2 10*3/uL (ref 1.5–6.5)
NEUT%: 64.9 % (ref 39.0–75.0)
Platelets: 179 10*3/uL (ref 140–400)
RBC: 4.44 10*6/uL (ref 4.20–5.82)
RDW: 13 % (ref 11.0–14.6)
WBC: 6.4 10*3/uL (ref 4.0–10.3)

## 2014-12-27 LAB — COMPREHENSIVE METABOLIC PANEL (CC13)
ALBUMIN: 3.5 g/dL (ref 3.5–5.0)
ALT: 28 U/L (ref 0–55)
AST: 25 U/L (ref 5–34)
Alkaline Phosphatase: 67 U/L (ref 40–150)
Anion Gap: 9 mEq/L (ref 3–11)
BUN: 36.1 mg/dL — AB (ref 7.0–26.0)
CO2: 25 mEq/L (ref 22–29)
Calcium: 9.3 mg/dL (ref 8.4–10.4)
Chloride: 104 mEq/L (ref 98–109)
Creatinine: 1.6 mg/dL — ABNORMAL HIGH (ref 0.7–1.3)
EGFR: 43 mL/min/{1.73_m2} — AB (ref >90)
GLUCOSE: 62 mg/dL — AB (ref 70–140)
Potassium: 4.4 mEq/L (ref 3.5–5.1)
Sodium: 138 mEq/L (ref 136–145)
Total Bilirubin: 0.4 mg/dL (ref 0.20–1.20)
Total Protein: 7 g/dL (ref 6.4–8.3)

## 2014-12-27 LAB — MAGNESIUM (CC13): Magnesium: 2.4 mg/dl (ref 1.5–2.5)

## 2014-12-28 ENCOUNTER — Encounter (HOSPITAL_COMMUNITY): Payer: Self-pay

## 2014-12-28 ENCOUNTER — Emergency Department (HOSPITAL_COMMUNITY)
Admission: EM | Admit: 2014-12-28 | Discharge: 2014-12-28 | Disposition: A | Discharge status: Home / Self Care | Payer: Medicare Other | Attending: Emergency Medicine | Admitting: Emergency Medicine

## 2014-12-28 DIAGNOSIS — M1990 Unspecified osteoarthritis, unspecified site: Secondary | ICD-10-CM | POA: Diagnosis not present

## 2014-12-28 DIAGNOSIS — Z87891 Personal history of nicotine dependence: Secondary | ICD-10-CM | POA: Insufficient documentation

## 2014-12-28 DIAGNOSIS — F419 Anxiety disorder, unspecified: Secondary | ICD-10-CM | POA: Diagnosis not present

## 2014-12-28 DIAGNOSIS — Z859 Personal history of malignant neoplasm, unspecified: Secondary | ICD-10-CM | POA: Diagnosis not present

## 2014-12-28 DIAGNOSIS — N189 Chronic kidney disease, unspecified: Secondary | ICD-10-CM | POA: Insufficient documentation

## 2014-12-28 DIAGNOSIS — R220 Localized swelling, mass and lump, head: Secondary | ICD-10-CM | POA: Insufficient documentation

## 2014-12-28 DIAGNOSIS — Z79899 Other long term (current) drug therapy: Secondary | ICD-10-CM | POA: Insufficient documentation

## 2014-12-28 DIAGNOSIS — E119 Type 2 diabetes mellitus without complications: Secondary | ICD-10-CM | POA: Diagnosis not present

## 2014-12-28 DIAGNOSIS — I129 Hypertensive chronic kidney disease with stage 1 through stage 4 chronic kidney disease, or unspecified chronic kidney disease: Secondary | ICD-10-CM | POA: Diagnosis not present

## 2014-12-28 HISTORY — DX: Malignant (primary) neoplasm, unspecified: C80.1

## 2014-12-28 MED ORDER — DEXAMETHASONE SODIUM PHOSPHATE 10 MG/ML IJ SOLN
10.0000 mg | Freq: Once | INTRAMUSCULAR | Status: AC
  Administered 2014-12-28: 10 mg via INTRAMUSCULAR
  Filled 2014-12-28: qty 1

## 2014-12-28 NOTE — ED Notes (Signed)
Pt tolerated water w/o difficulty. Pt has redness and minor swelling to uvula, but airway is intact and pt is breathing w/o difficulty. Pt is A&O and in NAD. Dr Audie Pinto at bedside

## 2014-12-28 NOTE — ED Notes (Signed)
He c/o feeling "something swollen in the back of my throat".  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  He is in no distress and is not ill-looking in appearance.  His breathing is normal with no stridor, nor any other difficulty.

## 2014-12-28 NOTE — Discharge Instructions (Signed)

## 2014-12-28 NOTE — ED Notes (Addendum)
Per resident, pt to drink water to see if he can tolerate swallowing fluids w/o difficulty

## 2014-12-28 NOTE — ED Provider Notes (Signed)
CSN: [id number]     Arrival date & time 12/28/14  0818 History   First MD Initiated Contact with Patient 12/28/14 [phone]     Chief Complaint  Patient presents with  . Oral Swelling     (Consider location/radiation/quality/duration/timing/severity/associated sxs/prior Treatment) The history is provided by the patient.    Mr. Kishi is a 69 yo M with PMH DM2, HTN, Chronic anxiety and nasopharyngeal cancer.  He presents today with tongue, glottis, and epiglottis swelling. These symptoms started this morning. He denies any trouble breathing or drinking water. He hasn't tried to eat anything this morning.  He sleeps with three pillows behind his back but does sleep somewhat flat. He has started his chemotherapy and radiation this past week for his cancer treatment. He received cisplatin and decadron on Monday and received 5 days of radiation.  He has a g-tube in place but has only been flushing it with water. He had his teeth extracted three weeks ago.  He has seen SLP and recommended swallowing exercises.    Past Medical History  Diagnosis Date  . Hyperlipidemia   . Hypertension   . Complication of anesthesia   . PONV (postoperative nausea and vomiting)   . Diabetes mellitus without complication     Type II  . Chronic kidney disease     Renal Insuffiency , Creatinine has been in the 2- 3 range, 11/27/14 Creatine 1.4  . Gout   . Arthritis   . Anxiety     Claustrophobia  . Cancer    Past Surgical History  Procedure Laterality Date  . Lymph node biopsy    . Colonoscopy    . Knee surgery Left 1990's    tibia crushed, pinned and bone grafted  . Colonoscopy w/ polypectomy    . Multiple extractions with alveoloplasty N/A 12/06/2014    Procedure: Extraction of tooth #'s 2,4,5,6,7,8,9,10,11,12,13,14,15, 20,22,26,27,28,29 with alveoloplasty.;  Surgeon: Lenn Cal, DDS;  Location: [facility];  Service: Oral Surgery;  Laterality: N/A;   Family History  Problem Relation Age of Onset  . Cancer  Mother     throat ca  . Cancer Father     lung ca  . Cancer Brother     throat ca  . Heart block Brother    History  Substance Use Topics  . Smoking status: Former Smoker -- 1.00 packs/day for 25 years    Quit date: 07/12/1998  . Smokeless tobacco: Never Used  . Alcohol Use: No    Review of Systems  Constitutional: Negative for fever and chills.  HENT: Positive for facial swelling. Negative for trouble swallowing.   Respiratory: Negative for shortness of breath and wheezing.   Cardiovascular: Negative for chest pain.  Gastrointestinal: Negative for nausea, vomiting, abdominal pain, diarrhea and constipation.  Skin: Negative for rash.  Neurological: Negative for weakness and numbness.  Hematological: Positive for adenopathy.      Allergies  Review of patient's allergies indicates no known allergies.  Home Medications   Prior to Admission medications   Medication Sig Start Date End Date Taking? Authorizing Provider  acetaminophen (TYLENOL) 500 MG tablet Take 500-1,000 mg by mouth every 6 (six) hours as needed for mild pain.    Historical Provider, MD  amLODipine (NORVASC) 5 MG tablet Take 5 mg by mouth every morning.     Historical Provider, MD  glimepiride (AMARYL) 2 MG tablet Take 2 mg by mouth every evening.  11/14/14   Historical Provider, MD  lidocaine-prilocaine (EMLA) cream Apply to  affected area once 12/19/14   Heath Lark, MD  LORazepam (ATIVAN) 1 MG tablet Take 1 tablet (1 mg total) by mouth 2 (two) times daily as needed for anxiety. 12/23/14   Eppie Gibson, MD  Multiple Vitamins-Minerals (CENTRUM SILVER PO) Take by mouth daily.    Historical Provider, MD  Omega-3 Fatty Acids (FISH OIL PO) Take by mouth daily.    Historical Provider, MD  ondansetron (ZOFRAN) 8 MG tablet Take 1 tablet (8 mg total) by mouth every 8 (eight) hours as needed. 12/19/14   Heath Lark, MD  oxyCODONE-acetaminophen (PERCOCET) 5-325 MG per tablet Take one or two tablets by mouth every 4-6 hours as  needed for pain. 12/06/14   Lenn Cal, DDS  prochlorperazine (COMPAZINE) 10 MG tablet Take 1 tablet (10 mg total) by mouth every 6 (six) hours as needed (Nausea or vomiting). 12/19/14   Ni Gorsuch, MD   BP 156/86 mmHg  Pulse 83  Temp(Src) 97.8 F (36.6 C) (Oral)  Resp 16  SpO2 99% Physical Exam  Constitutional: He is oriented to person, place, and time. He appears well-developed and well-nourished.  HENT:  Head: Normocephalic and atraumatic.  Mouth/Throat: Mucous membranes are normal.  No dentition Epiglottis swollen.    Eyes: Conjunctivae and EOM are normal.  Neck: Normal range of motion.  Cardiovascular: Normal rate, regular rhythm, normal heart sounds and intact distal pulses.   No murmur heard. Pulmonary/Chest: Effort normal and breath sounds normal. No respiratory distress. He has no wheezes. He has no rales.  Abdominal: Soft. Bowel sounds are normal. There is no tenderness. There is no rebound and no guarding.  G-tube in place. No signs of infection. Clean, dry and intact.   Musculoskeletal: Normal range of motion. He exhibits no edema.  Lymphadenopathy:    He has cervical adenopathy.  Neurological: He is alert and oriented to person, place, and time.  Skin: Skin is warm. No rash noted.    ED Course  Procedures (including critical care time) Labs Review Labs Reviewed - No data to display  Imaging Review No results found.   EKG Interpretation None      Medications  dexamethasone (DECADRON) injection 10 mg (10 mg Intramuscular Given 12/28/14 0923)    MDM   Final diagnoses:  Facial swelling   Mr. Brandis is p/w some mild epiglottis swelling. He has no trouble breathing or swallowing water. Most likely it is the result of his therapy that started this past week and when he also is lying down more flat at night. Will give decadron 10 mg IM.  Wife has informed that they have purchased a bed that will allow him to sleep more upright.  Patient agreeable with plan  and discharge.   Rosemarie Ax, MD PGY-2, [facility] 12/28/2014, 9:40 AM      Rosemarie Ax, MD 12/28/14 4680  Leonard Schwartz, MD 12/28/14 [phone]

## 2014-12-30 ENCOUNTER — Telehealth: Payer: Self-pay | Admitting: Hematology and Oncology

## 2014-12-30 ENCOUNTER — Ambulatory Visit (HOSPITAL_BASED_OUTPATIENT_CLINIC_OR_DEPARTMENT_OTHER): Payer: Medicare Other | Admitting: Hematology and Oncology

## 2014-12-30 ENCOUNTER — Ambulatory Visit
Admission: RE | Admit: 2014-12-30 | Discharge: 2014-12-30 | Disposition: A | Discharge status: Home / Self Care | Payer: Medicare Other | Source: Ambulatory Visit | Attending: Radiation Oncology | Admitting: Radiation Oncology

## 2014-12-30 ENCOUNTER — Ambulatory Visit: Payer: Medicare Other | Admitting: Nutrition

## 2014-12-30 ENCOUNTER — Encounter: Payer: Self-pay | Admitting: Hematology and Oncology

## 2014-12-30 ENCOUNTER — Encounter: Payer: Self-pay | Admitting: *Deleted

## 2014-12-30 ENCOUNTER — Ambulatory Visit (HOSPITAL_BASED_OUTPATIENT_CLINIC_OR_DEPARTMENT_OTHER): Payer: Medicare Other

## 2014-12-30 VITALS — BP 155/80 | HR 78 | Temp 97.6°F | Resp 12 | Wt 179.4 lb

## 2014-12-30 VITALS — BP 158/74 | HR 87 | Temp 98.3°F | Resp 18 | Ht 68.0 in | Wt 178.8 lb

## 2014-12-30 DIAGNOSIS — C119 Malignant neoplasm of nasopharynx, unspecified: Secondary | ICD-10-CM

## 2014-12-30 DIAGNOSIS — Z931 Gastrostomy status: Secondary | ICD-10-CM

## 2014-12-30 DIAGNOSIS — E1122 Type 2 diabetes mellitus with diabetic chronic kidney disease: Secondary | ICD-10-CM

## 2014-12-30 DIAGNOSIS — Z5111 Encounter for antineoplastic chemotherapy: Secondary | ICD-10-CM | POA: Diagnosis not present

## 2014-12-30 DIAGNOSIS — N182 Chronic kidney disease, stage 2 (mild): Secondary | ICD-10-CM | POA: Diagnosis not present

## 2014-12-30 MED ORDER — SODIUM CHLORIDE 0.9 % IV SOLN
30.0000 mg/m2 | Freq: Once | INTRAVENOUS | Status: AC
  Administered 2014-12-30: 59 mg via INTRAVENOUS
  Filled 2014-12-30: qty 59

## 2014-12-30 MED ORDER — SODIUM CHLORIDE 0.9 % IJ SOLN
10.0000 mL | INTRAMUSCULAR | Status: DC | PRN
  Administered 2014-12-30: 10 mL
  Filled 2014-12-30: qty 10

## 2014-12-30 MED ORDER — PALONOSETRON HCL INJECTION 0.25 MG/5ML
0.2500 mg | Freq: Once | INTRAVENOUS | Status: AC
  Administered 2014-12-30: 0.25 mg via INTRAVENOUS

## 2014-12-30 MED ORDER — LIDOCAINE-PRILOCAINE 2.5-2.5 % EX CREA
TOPICAL_CREAM | CUTANEOUS | Status: AC
  Filled 2014-12-30: qty 5

## 2014-12-30 MED ORDER — PALONOSETRON HCL INJECTION 0.25 MG/5ML
INTRAVENOUS | Status: AC
  Filled 2014-12-30: qty 5

## 2014-12-30 MED ORDER — HEPARIN SOD (PORK) LOCK FLUSH 100 UNIT/ML IV SOLN
500.0000 [IU] | Freq: Once | INTRAVENOUS | Status: AC | PRN
  Administered 2014-12-30: 500 [IU]
  Filled 2014-12-30: qty 5

## 2014-12-30 MED ORDER — SODIUM CHLORIDE 0.9 % IV SOLN
Freq: Once | INTRAVENOUS | Status: AC
  Administered 2014-12-30: 12:00:00 via INTRAVENOUS
  Filled 2014-12-30: qty 5

## 2014-12-30 MED ORDER — SODIUM CHLORIDE 0.9 % IV SOLN
Freq: Once | INTRAVENOUS | Status: AC
  Administered 2014-12-30: 10:00:00 via INTRAVENOUS

## 2014-12-30 MED ORDER — POTASSIUM CHLORIDE 2 MEQ/ML IV SOLN
Freq: Once | INTRAVENOUS | Status: AC
  Administered 2014-12-30: 10:00:00 via INTRAVENOUS
  Filled 2014-12-30: qty 10

## 2014-12-30 MED ORDER — DEXAMETHASONE 4 MG PO TABS: 4.0000 mg | ORAL_TABLET | Freq: Two times a day (BID) | ORAL | Status: DC

## 2014-12-30 NOTE — Progress Notes (Signed)
1130-OK to begin pre meds now.  May increase hydration fluids to 500 ml/hr when Cisplatin is complete per Dr. Alvy Bimler.

## 2014-12-30 NOTE — Progress Notes (Signed)
PAIN: He is currently in no pain.   SWALLOWING/DIET: Pt denies dysphagia. Pt reports a soft regular unmodified diet orally. Flushing tube with h20 145ml bid.  Noted feeding tube slight pink with a small amount of yellow drainage.  Cleansed with NS dressed with DD. Secured with tape. Oral exam reveals mucous membranes moist with clear and thin sputum.  BOWEL: Pt reports soft bowel movement everyday. SKIN: Skin exam reveals warm dry and intact. Pt continues to apply Biafine as directed.  WEIGHT/VS: Wt Readings from Last 3 Encounters:  12/30/14 179 lb 6.4 oz (81.375 kg)  12/30/14 178 lb 12.8 oz (81.103 kg)  12/23/14 175 lb 1.6 oz (79.425 kg)   BP 155/80 mmHg  Pulse 78  Temp(Src) 97.6 F (36.4 C) (Oral)  Resp 12  Wt 179 lb 6.4 oz (81.375 kg)  SpO2 99%

## 2014-12-30 NOTE — Telephone Encounter (Signed)
Gave and printed appt sched and avs for pt for June and July  °

## 2014-12-30 NOTE — Patient Instructions (Signed)
[facility] Discharge Instructions for Patients Receiving Chemotherapy  Today you received the following chemotherapy agents: Cisplatin   To help prevent nausea and vomiting after your treatment, we encourage you to take your nausea medication as directed.    If you develop nausea and vomiting that is not controlled by your nausea medication, call the clinic.   BELOW ARE SYMPTOMS THAT SHOULD BE REPORTED IMMEDIATELY:  *FEVER GREATER THAN 100.5 F  *CHILLS WITH OR WITHOUT FEVER  NAUSEA AND VOMITING THAT IS NOT CONTROLLED WITH YOUR NAUSEA MEDICATION  *UNUSUAL SHORTNESS OF BREATH  *UNUSUAL BRUISING OR BLEEDING  TENDERNESS IN MOUTH AND THROAT WITH OR WITHOUT PRESENCE OF ULCERS  *URINARY PROBLEMS  *BOWEL PROBLEMS  UNUSUAL RASH Items with * indicate a potential emergency and should be followed up as soon as possible.  Feel free to call the clinic you have any questions or concerns. The clinic phone number is [phone].  Please show the CHEMO ALERT CARD at check-in to the Emergency Department and triage nurse.   

## 2014-12-30 NOTE — Assessment & Plan Note (Signed)
Overall, he tolerated treatment with expected side effects with recent throat swelling. The lymph nodes are smaller in size. We will continue the same treatment without dose adjustment. I gave him prescription dexamethasone to take after chemotherapy to reduce risk of nausea, vomiting and throat swelling.

## 2014-12-30 NOTE — Progress Notes (Signed)
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   12/23/2014 -  Chemotherapy  he received weekly cisplatin   12/23/2014 -  Radiation Therapy  he received radiation treatment    INTERVAL HISTORY: Please see below for problem oriented charting. He is seen prior to cycle 2 of treatment. He went to the emergency department recently due to significant throat swelling and inability to swallow. His symptoms resolved with steroids injection. He sleeps better with his head elevated and requests a prescription for a special bed. Denies recent nausea or vomiting. He is still able to eat and swallow for 100% by mouth.  Denies hearing loss.  REVIEW OF SYSTEMS:   Constitutional: Denies fevers, chills or abnormal weight loss Eyes: Denies blurriness of vision Ears, nose, mouth, throat, and face: Denies mucositis or sore throat Respiratory: Denies cough, dyspnea or wheezes Cardiovascular: Denies palpitation, chest discomfort or lower extremity swelling Gastrointestinal:  Denies nausea, heartburn or change in bowel habits Skin: Denies abnormal skin rashes Lymphatics: Denies new lymphadenopathy or easy bruising Neurological:Denies numbness, tingling or new weaknesses Behavioral/Psych: Mood is  stable, no new changes  All other systems were reviewed with the patient and are negative.  I have reviewed the past medical history, past surgical history, social history and family history with the patient and they are unchanged from previous note.  ALLERGIES:  has No Known Allergies.  MEDICATIONS:  Current Outpatient Prescriptions  Medication Sig Dispense Refill  . acetaminophen (TYLENOL) 500 MG tablet Take 500-1,000 mg by mouth every 6 (six) hours as needed for mild pain.    Marland Kitchen amLODipine (NORVASC) 5 MG tablet Take 5 mg by mouth every morning.     Marland Kitchen dexamethasone (DECADRON) 4 MG tablet Take 1 tablet (4 mg total) by mouth 2 (two) times daily with a meal. 60 tablet 0  . glimepiride (AMARYL) 2 MG tablet Take 2 mg by mouth every evening.   4  . lidocaine-prilocaine (EMLA) cream Apply to affected area once 30 g 3  . LORazepam (ATIVAN) 1 MG tablet Take 1 tablet (1 mg total) by mouth 2 (two) times daily as needed for anxiety. 40 tablet 0  . Multiple Vitamins-Minerals (CENTRUM SILVER PO) Take by mouth daily.    . Omega-3 Fatty Acids (FISH OIL PO) Take by mouth daily.    . ondansetron (ZOFRAN) 8 MG tablet Take 1 tablet (8 mg total) by mouth every 8 (eight) hours as needed. 60 tablet 1  . oxyCODONE-acetaminophen (PERCOCET) 5-325 MG per tablet Take one or two tablets by mouth every 4-6 hours as needed for pain. 50 tablet 0  . prochlorperazine (COMPAZINE) 10 MG tablet Take 1 tablet (10 mg total) by mouth every 6 (six) hours as needed (Nausea or vomiting). 60 tablet 1   No current facility-administered medications for this visit.   Facility-Administered Medications Ordered in Other Visits  Medication  Dose Route Frequency Provider Last Rate Last Dose  . CISplatin (PLATINOL) 59 mg in sodium chloride 0.9 % 250 mL chemo infusion  30 mg/m2 (Treatment Plan Actual) Intravenous Once Heath Lark, MD      . dextrose 5 % and 0.45% NaCl 1,000 mL with potassium chloride 20 mEq, magnesium sulfate 12 mEq infusion    Intravenous Once Heath Lark, MD      . fosaprepitant (EMEND) 150 mg, dexamethasone (DECADRON) 12 mg in sodium chloride 0.9 % 145 mL IVPB   Intravenous Once Heath Lark, MD      . heparin lock flush 100 unit/mL  500 Units Intracatheter Once PRN Heath Lark, MD      . palonosetron (ALOXI) injection 0.25 mg  0.25 mg Intravenous Once Heath Lark, MD      . sodium chloride 0.9 % injection 10 mL  10 mL Intracatheter PRN Heath Lark, MD        PHYSICAL EXAMINATION: ECOG PERFORMANCE STATUS: 1 - Symptomatic but completely ambulatory  Filed Vitals:   12/30/14 0851  BP: 158/74  Pulse: 87  Temp: 98.3 F (36.8 C)  Resp: 18   Filed Weights   12/30/14 0851  Weight: 178 lb 12.8 oz (81.103 kg)    GENERAL:alert, no distress and comfortable SKIN: skin color, texture, turgor are normal, no rashes or significant lesions EYES: normal, Conjunctiva are pink and non-injected, sclera clear OROPHARYNX:no exudate, no erythema and lips, buccal mucosa, and tongue normal  NECK: supple, thyroid normal size, non-tender, without nodularity LYMPH:  Persistent palpable lymphadenopathy, smaller in size. LUNGS: clear to auscultation and percussion with normal breathing effort HEART: regular rate & rhythm and no murmurs and no lower extremity edema ABDOMEN:abdomen soft, non-tender and normal bowel sounds. Feeding tube site looks okay Musculoskeletal:no cyanosis of digits and no clubbing  NEURO: alert & oriented x 3 with fluent speech, no focal motor/sensory deficits  LABORATORY DATA:  I have reviewed the data as listed    Component Value Date/Time   NA 138 12/27/2014 1549   K 4.4 12/27/2014 1549   CO2 25 12/27/2014 1549   GLUCOSE 62* 12/27/2014 1549   BUN 36.1* 12/27/2014 1549   CREATININE 1.6* 12/27/2014 1549   CALCIUM 9.3 12/27/2014 1549   PROT 7.0 12/27/2014 1549   ALBUMIN 3.5 12/27/2014 1549   AST 25 12/27/2014 1549   ALT 28 12/27/2014 1549   ALKPHOS 67 12/27/2014 1549   BILITOT 0.40 12/27/2014 1549     No results found for: SPEP, UPEP  Lab Results  Component Value Date   WBC 6.4 12/27/2014   NEUTROABS 4.2 12/27/2014   HGB 13.0 12/27/2014   HCT 38.1* 12/27/2014   MCV 85.8 12/27/2014   PLT 179 12/27/2014      Chemistry      Component Value Date/Time   NA 138 12/27/2014 1549   K 4.4 12/27/2014 1549   CO2 25 12/27/2014 1549   BUN 36.1* 12/27/2014 1549   CREATININE 1.6* 12/27/2014 1549      Component Value Date/Time   CALCIUM 9.3 12/27/2014 1549   ALKPHOS 67 12/27/2014 1549   AST 25 12/27/2014 1549   ALT 28 12/27/2014 1549   BILITOT 0.40 12/27/2014 1549      ASSESSMENT & PLAN:  Nasopharyngeal cancer  Overall, he tolerated treatment with expected side effects with recent throat swelling. The lymph nodes are smaller in size. We will continue the same treatment without dose adjustment. I gave him prescription dexamethasone to take after chemotherapy to reduce risk  of nausea, vomiting and throat swelling.  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 He can proceed with chemotherapy as long as creatinine remains on the 2    S/P gastrostomy Feeding tube site looks okay. Home health has been consulted.     No orders of the defined types were placed in this encounter.   All questions were answered. The patient knows to call the clinic with any problems, questions or concerns. No barriers to learning was detected. I spent 25 minutes counseling the patient face to face. The total time spent in the appointment was 30 minutes and more than 50% was on counseling and review of test results     [facility], [place], MD 12/30/2014 9:39 AM

## 2014-12-30 NOTE — Assessment & Plan Note (Signed)
He has reduced creatinine clearance which is chronic I am willing to proceed with chemotherapy, dose adjustment by reducing chemotherapy by 25% weekly and aggressive fluid hydration. He can proceed with chemotherapy as long as creatinine remains on the 2

## 2014-12-30 NOTE — Progress Notes (Addendum)
Oncology Nurse Navigator Documentation  Oncology Nurse Navigator Flowsheets 12/30/2014  Referral date to RadOnc/MedOnc -  Navigator Encounter Type Treatment  Patient Visit Type Radonc  Treatment Phase Other  To provide support and encouragement, care continuity and to assess for needs, met with patient during Tomo tmt and afterwards during weekly UT with Dr. Murray.  His dtr from [place] and his wife accompanied him.  Patient reported to Dr. Murray that L ear is staying open consistently now.  He continues to pre-med before RT with 2 mg Ativan and OTC Benadryl.  He reports BID flushing of PEG.  He reports oral intake of nutrition and fluid at baseline.  He denied any needs or concerns; I encouraged him to contact me if that changes before I see him next, he verbalized understanding.    Barriers/Navigation Needs -  Interventions -  Time Spent with Patient 60   Rick , RN, BSN, CHPN Head & Neck Oncology Navigator [facility] [place] [phone]   

## 2014-12-30 NOTE — Progress Notes (Signed)
Weekly Management Note:  Site: Nasopharynx/bilateral neck Current Dose:  1200  cGy Projected Dose: 7000  cGy  Narrative: The patient is seen today for routine under treatment assessment. CBCT/MVCT images/port films were reviewed. The chart was reviewed.   He is still doing well.  However, he was seen in the emergency department for a "swollen throat" and was given dexamethasone with marked improvement.  His left hearing is improved with suspected improvement of his serous otitis media.  No dysphagia.  He continues with weekly chemotherapy.  Physical Examination:  Filed Vitals:   12/30/14 1632  BP: 155/80  Pulse: 78  Temp: 97.6 F (36.4 C)  Resp: 12  .  Weight: 179 lb 6.4 oz (81.375 kg).  Alert and oriented.  There has been some regression of his bilateral neck adenopathy, left greater than right.  Oral cavity and oropharynx unremarkable to inspection.  PEG tube site is clean.  Laboratory data: Lab Results  Component Value Date   WBC 6.4 12/27/2014   HGB 13.0 12/27/2014   HCT 38.1* 12/27/2014   MCV 85.8 12/27/2014   PLT 179 12/27/2014     Impression: Tolerating radiation therapy well.  Plan: Continue radiation therapy as planned.

## 2014-12-30 NOTE — Progress Notes (Signed)
Nutrition follow-up completed with patient and wife, during chemotherapy.  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 Patient continues to flush feeding tube twice a day with water.  Patient denies problems with feeding tube at this time. Weight has increased and was documented as 178.8 pounds June 20, up from 173.8 pounds June 9. Patient states he is eating well.  He has no nutrition concerns. Labs noted on June 17 to be glucose of 62, BUN 36.1, and creatinine 1.6.  Nutrition diagnosis: Predicted suboptimal energy intake continues.  Intervention: Educated patient to continue strategies for adequate calories and protein, focusing on weight maintenance. Patient will continue to flush feeding tube with water as tolerated. Will begin feedings via PEG once weight drops 5% from usual body weight. Questions were answered.  Teach back method was used  Monitoring, evaluation, goals: Patient will tolerate adequate calories and protein for weight maintenance.  Next visit: Monday, June 27, during chemotherapy.  **Disclaimer: This note was dictated with voice recognition software. Similar sounding words can inadvertently be transcribed and this note may contain transcription errors which may not have been corrected upon publication of note.**

## 2014-12-30 NOTE — Assessment & Plan Note (Signed)
Feeding tube site looks okay. Home health has been consulted.

## 2014-12-30 NOTE — Progress Notes (Signed)
Okay to treat with creatinine 1.6.  Goal of <2.0 per 12/30/2014 MD note.

## 2014-12-31 ENCOUNTER — Ambulatory Visit
Admission: RE | Admit: 2014-12-31 | Discharge: 2014-12-31 | Disposition: A | Discharge status: Home / Self Care | Payer: Medicare Other | Source: Ambulatory Visit | Attending: Radiation Oncology | Admitting: Radiation Oncology

## 2014-12-31 DIAGNOSIS — C119 Malignant neoplasm of nasopharynx, unspecified: Secondary | ICD-10-CM | POA: Diagnosis not present

## 2015-01-01 ENCOUNTER — Ambulatory Visit
Admission: RE | Admit: 2015-01-01 | Discharge: 2015-01-01 | Disposition: A | Discharge status: Home / Self Care | Payer: Medicare Other | Source: Ambulatory Visit | Attending: Radiation Oncology | Admitting: Radiation Oncology

## 2015-01-01 DIAGNOSIS — C119 Malignant neoplasm of nasopharynx, unspecified: Secondary | ICD-10-CM | POA: Diagnosis not present

## 2015-01-02 ENCOUNTER — Ambulatory Visit: Payer: Self-pay | Admitting: Hematology and Oncology

## 2015-01-02 ENCOUNTER — Encounter (HOSPITAL_COMMUNITY): Payer: Self-pay | Admitting: Dentistry

## 2015-01-02 ENCOUNTER — Encounter: Payer: Self-pay | Admitting: Hematology and Oncology

## 2015-01-02 ENCOUNTER — Ambulatory Visit (HOSPITAL_COMMUNITY): Payer: Self-pay | Admitting: Dentistry

## 2015-01-02 ENCOUNTER — Ambulatory Visit
Admission: RE | Admit: 2015-01-02 | Discharge: 2015-01-02 | Disposition: A | Discharge status: Home / Self Care | Payer: Medicare Other | Source: Ambulatory Visit | Attending: Radiation Oncology | Admitting: Radiation Oncology

## 2015-01-02 VITALS — BP 151/73 | HR 83 | Temp 98.4°F | Wt 175.0 lb

## 2015-01-02 DIAGNOSIS — C119 Malignant neoplasm of nasopharynx, unspecified: Secondary | ICD-10-CM | POA: Diagnosis not present

## 2015-01-02 DIAGNOSIS — R1310 Dysphagia, unspecified: Secondary | ICD-10-CM

## 2015-01-02 DIAGNOSIS — K117 Disturbances of salivary secretion: Secondary | ICD-10-CM

## 2015-01-02 DIAGNOSIS — K08109 Complete loss of teeth, unspecified cause, unspecified class: Secondary | ICD-10-CM

## 2015-01-02 DIAGNOSIS — K0820 Unspecified atrophy of edentulous alveolar ridge: Secondary | ICD-10-CM

## 2015-01-02 DIAGNOSIS — R682 Dry mouth, unspecified: Secondary | ICD-10-CM

## 2015-01-02 DIAGNOSIS — Z0189 Encounter for other specified special examinations: Secondary | ICD-10-CM

## 2015-01-02 NOTE — Patient Instructions (Addendum)
RECOMMENDATIONS: 1. Brush tongue daily. 2. Use trismus exercises as directed. 3. Use Biotene Rinse or salt water/baking soda rinses. 4. Multiple sips of water as needed. 5. Return to clinic in two months for oral exam after radiation therapy. 6. Consider referral to prosthodontist for fabrication of upper lower complete dentures with or without implants 3 months after the last radiation therapy has been completed. Call if problems before then.  Lenn Cal, DDS

## 2015-01-02 NOTE — Progress Notes (Signed)
I advised cameo I placed forms on desk 12/24/14 and don't have them back yet

## 2015-01-02 NOTE — Progress Notes (Signed)
01/02/2015  Patient Name:   Adam Yang Date of Birth:   [birth year] Medical Record Number: [id number]  BP 151/73 mmHg  Pulse 83  Temp(Src) 98.4 F (36.9 C) (Oral)  Wt 175 lb (79.379 kg)  Catalino Timoney presents for oral examination during chemoradiation therapy. Patient has completed 8/35 radiation treatments. Patient has completed 2 cycles of chemotherapy that are given on a weekly basis.  REVIEW OF CHIEF COMPLAINTS: DRY MOUTH: Yes  HARD TO SWALLOW: Yes, at times.  HURT TO SWALLOW: Yes, at times. TASTE CHANGES: No SORES IN MOUTH: No TRISMUS: No trismus symptoms. WEIGHT: 175 pounds down from initial 178 pounds.  HOME OH REGIMEN:  BRUSHING: Not applicable  FLOSSING: N/A RINSING: Using salt water and baking soda rinses and Biotene rinses. FLUORIDE: Not applicable TRISMUS EXERCISES:  Maximum interincisal opening: 55 mm. Patient is starting trismus exercises today.   DENTAL EXAM:  Oral Hygiene:(PLAQUE): Edentulous LOCATION OF MUCOSITIS: None noted DESCRIPTION OF SALIVA: Decreased saliva. Incipient xerostomia. ANY EXPOSED BONE: None noted OTHER WATCHED AREAS: Previous extraction sites. Atrophy of edentulous alveolar ridges. DX: Xerostomia, Dysphagia, Odynophagia, Weight Loss and Edentulous  RECOMMENDATIONS: 1. Brush tongue daily. 2. Use trismus exercises as directed. 3. Use Biotene Rinse or salt water/baking soda rinses. 4. Multiple sips of water as needed. 5. Return to clinic in two months for oral exam after radiation therapy. 6. Consider referral to prosthodontist for fabrication of upper lower complete dentures with or without implants 3 months after the last radiation therapy has been completed. Call if problems before then.  Lenn Cal, DDS

## 2015-01-03 ENCOUNTER — Ambulatory Visit
Admission: RE | Admit: 2015-01-03 | Discharge: 2015-01-03 | Disposition: A | Discharge status: Home / Self Care | Payer: Medicare Other | Source: Ambulatory Visit | Attending: Radiation Oncology | Admitting: Radiation Oncology

## 2015-01-03 ENCOUNTER — Encounter: Payer: Self-pay | Admitting: Hematology and Oncology

## 2015-01-03 ENCOUNTER — Other Ambulatory Visit (HOSPITAL_BASED_OUTPATIENT_CLINIC_OR_DEPARTMENT_OTHER): Payer: Medicare Other

## 2015-01-03 ENCOUNTER — Telehealth: Payer: Self-pay | Admitting: *Deleted

## 2015-01-03 DIAGNOSIS — C119 Malignant neoplasm of nasopharynx, unspecified: Secondary | ICD-10-CM | POA: Diagnosis not present

## 2015-01-03 LAB — CBC WITH DIFFERENTIAL/PLATELET
BASO%: 0.2 % (ref 0.0–2.0)
Basophils Absolute: 0 10*3/uL (ref 0.0–0.1)
EOS%: 0 % (ref 0.0–7.0)
Eosinophils Absolute: 0 10*3/uL (ref 0.0–0.5)
HCT: 37.6 % — ABNORMAL LOW (ref 38.4–49.9)
HGB: 12.7 g/dL — ABNORMAL LOW (ref 13.0–17.1)
LYMPH#: 0.2 10*3/uL — AB (ref 0.9–3.3)
LYMPH%: 2.2 % — ABNORMAL LOW (ref 14.0–49.0)
MCH: 28.9 pg (ref 27.2–33.4)
MCHC: 33.9 g/dL (ref 32.0–36.0)
MCV: 85.4 fL (ref 79.3–98.0)
MONO#: 0.6 10*3/uL (ref 0.1–0.9)
MONO%: 5.5 % (ref 0.0–14.0)
NEUT#: 9.5 10*3/uL — ABNORMAL HIGH (ref 1.5–6.5)
NEUT%: 92.1 % — ABNORMAL HIGH (ref 39.0–75.0)
Platelets: 222 10*3/uL (ref 140–400)
RBC: 4.4 10*6/uL (ref 4.20–5.82)
RDW: 13.3 % (ref 11.0–14.6)
WBC: 10.3 10*3/uL (ref 4.0–10.3)

## 2015-01-03 LAB — COMPREHENSIVE METABOLIC PANEL (CC13)
ALT: 26 U/L (ref 0–55)
ANION GAP: 11 meq/L (ref 3–11)
AST: 13 U/L (ref 5–34)
Albumin: 3.2 g/dL — ABNORMAL LOW (ref 3.5–5.0)
Alkaline Phosphatase: 60 U/L (ref 40–150)
BILIRUBIN TOTAL: 0.3 mg/dL (ref 0.20–1.20)
BUN: 51.4 mg/dL — ABNORMAL HIGH (ref 7.0–26.0)
CO2: 24 meq/L (ref 22–29)
CREATININE: 1.5 mg/dL — AB (ref 0.7–1.3)
Calcium: 8.7 mg/dL (ref 8.4–10.4)
Chloride: 96 mEq/L — ABNORMAL LOW (ref 98–109)
EGFR: 46 mL/min/{1.73_m2} — AB (ref >90)
Glucose: 408 mg/dl — ABNORMAL HIGH (ref 70–140)
Potassium: 4.9 mEq/L (ref 3.5–5.1)
Sodium: 130 mEq/L — ABNORMAL LOW (ref 136–145)
Total Protein: 6.3 g/dL — ABNORMAL LOW (ref 6.4–8.3)

## 2015-01-03 LAB — MAGNESIUM (CC13): Magnesium: 2.1 mg/dl (ref 1.5–2.5)

## 2015-01-03 NOTE — Telephone Encounter (Signed)
Informed pt of FMLA paperwork ready to pick up out front.  He verbalized understanding.

## 2015-01-03 NOTE — Progress Notes (Signed)
I placed fmla forms at desk for patient pick up with ms. Wilma. No fax included to send

## 2015-01-06 ENCOUNTER — Encounter: Payer: Self-pay | Admitting: Radiation Oncology

## 2015-01-06 ENCOUNTER — Ambulatory Visit: Payer: Medicare Other | Admitting: Nutrition

## 2015-01-06 ENCOUNTER — Ambulatory Visit
Admission: RE | Admit: 2015-01-06 | Discharge: 2015-01-06 | Disposition: A | Discharge status: Home / Self Care | Payer: Medicare Other | Source: Ambulatory Visit | Attending: Radiation Oncology | Admitting: Radiation Oncology

## 2015-01-06 ENCOUNTER — Ambulatory Visit (HOSPITAL_BASED_OUTPATIENT_CLINIC_OR_DEPARTMENT_OTHER): Payer: Medicare Other | Admitting: Hematology and Oncology

## 2015-01-06 ENCOUNTER — Encounter: Payer: Self-pay | Admitting: Hematology and Oncology

## 2015-01-06 ENCOUNTER — Ambulatory Visit (HOSPITAL_BASED_OUTPATIENT_CLINIC_OR_DEPARTMENT_OTHER): Payer: Medicare Other

## 2015-01-06 VITALS — BP 155/79 | HR 73 | Temp 97.9°F | Resp 20 | Wt 173.8 lb

## 2015-01-06 VITALS — BP 157/75 | HR 78 | Temp 98.1°F | Resp 18 | Ht 68.0 in | Wt 173.3 lb

## 2015-01-06 DIAGNOSIS — C119 Malignant neoplasm of nasopharynx, unspecified: Secondary | ICD-10-CM

## 2015-01-06 DIAGNOSIS — E1165 Type 2 diabetes mellitus with hyperglycemia: Secondary | ICD-10-CM

## 2015-01-06 DIAGNOSIS — D630 Anemia in neoplastic disease: Secondary | ICD-10-CM | POA: Diagnosis not present

## 2015-01-06 DIAGNOSIS — Z5111 Encounter for antineoplastic chemotherapy: Secondary | ICD-10-CM | POA: Diagnosis not present

## 2015-01-06 DIAGNOSIS — E1122 Type 2 diabetes mellitus with diabetic chronic kidney disease: Secondary | ICD-10-CM | POA: Diagnosis not present

## 2015-01-06 DIAGNOSIS — N182 Chronic kidney disease, stage 2 (mild): Secondary | ICD-10-CM

## 2015-01-06 DIAGNOSIS — Z931 Gastrostomy status: Secondary | ICD-10-CM

## 2015-01-06 MED ORDER — SODIUM CHLORIDE 0.9 % IV SOLN
Freq: Once | INTRAVENOUS | Status: AC
  Administered 2015-01-06: 08:00:00 via INTRAVENOUS

## 2015-01-06 MED ORDER — PALONOSETRON HCL INJECTION 0.25 MG/5ML
0.2500 mg | Freq: Once | INTRAVENOUS | Status: AC
  Administered 2015-01-06: 0.25 mg via INTRAVENOUS

## 2015-01-06 MED ORDER — SODIUM CHLORIDE 0.9 % IV SOLN
Freq: Once | INTRAVENOUS | Status: AC
  Administered 2015-01-06: 10:00:00 via INTRAVENOUS
  Filled 2015-01-06: qty 5

## 2015-01-06 MED ORDER — SODIUM CHLORIDE 0.9 % IV SOLN
30.0000 mg/m2 | Freq: Once | INTRAVENOUS | Status: AC
  Administered 2015-01-06: 59 mg via INTRAVENOUS
  Filled 2015-01-06: qty 59

## 2015-01-06 MED ORDER — SODIUM CHLORIDE 0.9 % IJ SOLN
10.0000 mL | INTRAMUSCULAR | Status: DC | PRN
  Administered 2015-01-06: 10 mL
  Filled 2015-01-06: qty 10

## 2015-01-06 MED ORDER — POTASSIUM CHLORIDE 2 MEQ/ML IV SOLN
Freq: Once | INTRAVENOUS | Status: DC
  Administered 2015-01-06: 08:00:00 via INTRAVENOUS
  Filled 2015-01-06: qty 10

## 2015-01-06 MED ORDER — HEPARIN SOD (PORK) LOCK FLUSH 100 UNIT/ML IV SOLN
500.0000 [IU] | Freq: Once | INTRAVENOUS | Status: AC | PRN
  Administered 2015-01-06: 500 [IU]
  Filled 2015-01-06: qty 5

## 2015-01-06 NOTE — Progress Notes (Signed)
Weekly rad txs nasopharynx/b/l neck, erythema omn face and neck, using biafine cream bid on face will start on his neck today, looks like tongue slight swollen, dry, had Cisplatin IV this am, drinking plenty water,no diff swallowing, but is pureeing his foods, still has taste buds, energy level  Good for now no c/o pain 1:40 PM BP 155/79 mmHg  Pulse 73  Temp(Src) 97.9 F (36.6 C) (Oral)  Resp 20  Wt 173 lb 12.8 oz (78.835 kg)  SpO2 99%  Wt Readings from Last 3 Encounters:  01/06/15 173 lb 12.8 oz (78.835 kg)  01/06/15 173 lb 4.8 oz (78.608 kg)  01/02/15 175 lb (79.379 kg)

## 2015-01-06 NOTE — Patient Instructions (Signed)
[facility] Discharge Instructions for Patients Receiving Chemotherapy  Today you received the following chemotherapy agents Cisplatin  To help prevent nausea and vomiting after your treatment, we encourage you to take your nausea medication   If you develop nausea and vomiting that is not controlled by your nausea medication, call the clinic.   BELOW ARE SYMPTOMS THAT SHOULD BE REPORTED IMMEDIATELY:  *FEVER GREATER THAN 100.5 F  *CHILLS WITH OR WITHOUT FEVER  NAUSEA AND VOMITING THAT IS NOT CONTROLLED WITH YOUR NAUSEA MEDICATION  *UNUSUAL SHORTNESS OF BREATH  *UNUSUAL BRUISING OR BLEEDING  TENDERNESS IN MOUTH AND THROAT WITH OR WITHOUT PRESENCE OF ULCERS  *URINARY PROBLEMS  *BOWEL PROBLEMS  UNUSUAL RASH Items with * indicate a potential emergency and should be followed up as soon as possible.  Feel free to call the clinic you have any questions or concerns. The clinic phone number is [phone].  Please show the CHEMO ALERT CARD at check-in to the Emergency Department and triage nurse.   

## 2015-01-06 NOTE — Progress Notes (Signed)
Nutrition follow-up completed with patient treated for nasopharyngeal cancer. Patient continues to flush feeding tube twice a day with water. Patient is eating 100% of oral diet. Weight has decreased and was documented as 173.3 pounds on June 27, down from 178.8 pounds June 20. Patient reports usual body weight is approximately 175 pounds. Patient verbalizes an increase in glucose to 408 secondary to steroids. Patient denies nutrition impact symptoms.  Nutrition diagnosis:  Predicted suboptimal energy intake continues.  Intervention:  Educated patient to continue strategies for increased oral intake. Encouraged patient to continue free water flushes from feeding tube as well as adequate hydration by mouth. Will begin feedings via PEG once weight drops 5% from usual body weight. Teach back method used.  Monitoring, evaluation, goals: Patient will tolerate adequate calories and protein to maintain weight.  Next visit: Tuesday, July 5, during chemotherapy.  **Disclaimer: This note was dictated with voice recognition software. Similar sounding words can inadvertently be transcribed and this note may contain transcription errors which may not have been corrected upon publication of note.**

## 2015-01-06 NOTE — Progress Notes (Signed)
Weekly Management Note:  Site: Nasopharynx/bilateral neck Current Dose:  2200  cGy Projected Dose: 7000  cGy  Narrative: The patient is seen today for routine under treatment assessment. CBCT/MVCT images/port films were reviewed. The chart was reviewed.   He is without complaints today.  He received weekly chemotherapy this morning.  He states that he still has taste.  His flushing his PEG tube but is not yet using it.  He denies pain.  Physical Examination:  Filed Vitals:   01/06/15 1336  BP: 155/79  Pulse: 73  Temp: 97.9 F (36.6 C)  Resp: 20  .  Weight: 173 lb 12.8 oz (78.835 kg).  There has been further regression of his left and right neck adenopathy.  Oral cavity and oropharynx remarkable for mild mucositis along the pharynx.  No candidiasis.  Laboratory data: Lab Results  Component Value Date   WBC 10.3 01/03/2015   HGB 12.7* 01/03/2015   HCT 37.6* 01/03/2015   MCV 85.4 01/03/2015   PLT 222 01/03/2015     Impression: Tolerating radiation therapy well.  Plan: Continue radiation therapy as planned.

## 2015-01-06 NOTE — Progress Notes (Signed)
Urine output = 400cc @ 0845 Urine output = 400cc @ 9741

## 2015-01-06 NOTE — Progress Notes (Signed)
Total urine output pre-Cisplatin = 1850cc  Total urine output post-Cisplatin = 500cc

## 2015-01-07 ENCOUNTER — Ambulatory Visit
Admission: RE | Admit: 2015-01-07 | Discharge: 2015-01-07 | Disposition: A | Discharge status: Home / Self Care | Payer: Medicare Other | Source: Ambulatory Visit | Attending: Radiation Oncology | Admitting: Radiation Oncology

## 2015-01-07 ENCOUNTER — Ambulatory Visit (HOSPITAL_BASED_OUTPATIENT_CLINIC_OR_DEPARTMENT_OTHER): Payer: Medicare Other

## 2015-01-07 VITALS — BP 168/75 | HR 73 | Temp 98.1°F | Resp 18

## 2015-01-07 DIAGNOSIS — E860 Dehydration: Secondary | ICD-10-CM

## 2015-01-07 DIAGNOSIS — E1165 Type 2 diabetes mellitus with hyperglycemia: Secondary | ICD-10-CM | POA: Insufficient documentation

## 2015-01-07 DIAGNOSIS — C119 Malignant neoplasm of nasopharynx, unspecified: Secondary | ICD-10-CM | POA: Diagnosis not present

## 2015-01-07 DIAGNOSIS — T451X5A Adverse effect of antineoplastic and immunosuppressive drugs, initial encounter: Secondary | ICD-10-CM

## 2015-01-07 DIAGNOSIS — D6481 Anemia due to antineoplastic chemotherapy: Secondary | ICD-10-CM | POA: Insufficient documentation

## 2015-01-07 MED ORDER — HEPARIN SOD (PORK) LOCK FLUSH 100 UNIT/ML IV SOLN
500.0000 [IU] | Freq: Once | INTRAVENOUS | Status: AC | PRN
  Administered 2015-01-07: 500 [IU]
  Filled 2015-01-07: qty 5

## 2015-01-07 MED ORDER — SODIUM CHLORIDE 0.9 % IJ SOLN
10.0000 mL | INTRAMUSCULAR | Status: DC | PRN
  Administered 2015-01-07: 10 mL
  Filled 2015-01-07: qty 10

## 2015-01-07 MED ORDER — SODIUM CHLORIDE 0.9 % IV SOLN
Freq: Once | INTRAVENOUS | Status: AC
  Administered 2015-01-07: 15:00:00 via INTRAVENOUS

## 2015-01-07 NOTE — Assessment & Plan Note (Signed)
This is likely due to recent treatment. The patient denies recent history of bleeding such as epistaxis, hematuria or hematochezia. He is asymptomatic from the anemia. I will observe for now.  He does not require transfusion now. I will continue the chemotherapy at current dose without dosage adjustment.  If the anemia gets progressive worse in the future, I might have to delay his treatment or adjust the chemotherapy dose.  

## 2015-01-07 NOTE — Patient Instructions (Signed)
Dehydration, Adult Dehydration is when you lose more fluids from the body than you take in. Vital organs like the kidneys, brain, and heart cannot function without a proper amount of fluids and salt. Any loss of fluids from the body can cause dehydration.  CAUSES   Vomiting.  Diarrhea.  Excessive sweating.  Excessive urine output.  Fever. SYMPTOMS  Mild dehydration  Thirst.  Dry lips.  Slightly dry mouth. Moderate dehydration  Very dry mouth.  Sunken eyes.  Skin does not bounce back quickly when lightly pinched and released.  Dark urine and decreased urine production.  Decreased tear production.  Headache. Severe dehydration  Very dry mouth.  Extreme thirst.  Rapid, weak pulse (more than 100 beats per minute at rest).  Cold hands and feet.  Not able to sweat in spite of heat and temperature.  Rapid breathing.  Blue lips.  Confusion and lethargy.  Difficulty being awakened.  Minimal urine production.  No tears. DIAGNOSIS  Your caregiver will diagnose dehydration based on your symptoms and your exam. Blood and urine tests will help confirm the diagnosis. The diagnostic evaluation should also identify the cause of dehydration. TREATMENT  Treatment of mild or moderate dehydration can often be done at home by increasing the amount of fluids that you drink. It is best to drink small amounts of fluid more often. Drinking too much at one time can make vomiting worse. Refer to the home care instructions below. Severe dehydration needs to be treated at the hospital where you will probably be given intravenous (IV) fluids that contain water and electrolytes. HOME CARE INSTRUCTIONS   Ask your caregiver about specific rehydration instructions.  Drink enough fluids to keep your urine clear or pale yellow.  Drink small amounts frequently if you have nausea and vomiting.  Eat as you normally do.  Avoid:  Foods or drinks high in sugar.  Carbonated  drinks.  Juice.  Extremely hot or cold fluids.  Drinks with caffeine.  Fatty, greasy foods.  Alcohol.  Tobacco.  Overeating.  Gelatin desserts.  Wash your hands well to avoid spreading bacteria and viruses.  Only take over-the-counter or prescription medicines for pain, discomfort, or fever as directed by your caregiver.  Ask your caregiver if you should continue all prescribed and over-the-counter medicines.  Keep all follow-up appointments with your caregiver. SEEK MEDICAL CARE IF:  You have abdominal pain and it increases or stays in one area (localizes).  You have a rash, stiff neck, or severe headache.  You are irritable, sleepy, or difficult to awaken.  You are weak, dizzy, or extremely thirsty. SEEK IMMEDIATE MEDICAL CARE IF:   You are unable to keep fluids down or you get worse despite treatment.  You have frequent episodes of vomiting or diarrhea.  You have blood or green matter (bile) in your vomit.  You have blood in your stool or your stool looks black and tarry.  You have not urinated in 6 to 8 hours, or you have only urinated a small amount of very dark urine.  You have a fever.  You faint. MAKE SURE YOU:   Understand these instructions.  Will watch your condition.  Will get help right away if you are not doing well or get worse. Document Released: 06/28/2005 Document Revised: 09/20/2011 Document Reviewed: 02/15/2011 ExitCare Patient Information 2015 [employer]. This information is not intended to replace advice given to you by your health care provider. Make sure you discuss any questions you have with your health care   provider.  

## 2015-01-07 NOTE — Assessment & Plan Note (Signed)
Overall, he tolerated treatment with expected side effects. The lymph nodes are smaller in size. We will continue the same treatment without dose adjustment. I gave him prescription dexamethasone to take after chemotherapy to reduce risk of nausea, vomiting and throat swelling.

## 2015-01-07 NOTE — Assessment & Plan Note (Signed)
Feeding tube site looks okay.  He will continue flushes. He is still able to tolerate 100% of food by mouth.

## 2015-01-07 NOTE — Progress Notes (Signed)
Pt reports having a "raw" feeling to roof of mouth since yesterday, denies any soreness to mouth or throat, denies bleeding. Able to still eat and drink without difficulty.  Pt states he is using his mouth rinses. Notified to call if problem worsens or persists.  Pt also denies nausea/vomiting and reports he does not need zofran today.

## 2015-01-07 NOTE — Assessment & Plan Note (Signed)
He has reduced creatinine clearance which is chronic I am willing to proceed with chemotherapy, dose adjustment by reducing chemotherapy by 25% weekly and aggressive fluid hydration. He can proceed with chemotherapy as long as creatinine remains under 2

## 2015-01-07 NOTE — Assessment & Plan Note (Signed)
His blood sugars quite high lately related to corticosteroids. He is doing well and able to maintain hydration. Continue to same.

## 2015-01-07 NOTE — Progress Notes (Signed)
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   12/23/2014 -  Chemotherapy  he received weekly cisplatin   12/23/2014 -  Radiation Therapy  he received radiation treatment    INTERVAL HISTORY: Please see below for problem oriented charting. He tolerated treatment better last week. Denies further throat swelling. Denies mucositis or difficulties with swallowing. Lymphadenopathy is improving.  REVIEW OF SYSTEMS:   Constitutional: Denies fevers, chills or abnormal weight loss Eyes: Denies blurriness of vision Ears, nose, mouth, throat, and face: Denies mucositis or sore throat Respiratory: Denies cough, dyspnea or wheezes Cardiovascular: Denies palpitation, chest discomfort or lower extremity swelling Gastrointestinal:  Denies nausea, heartburn or change in bowel habits Skin: Denies abnormal skin rashes Lymphatics: Denies new lymphadenopathy or easy bruising Neurological:Denies numbness, tingling or new weaknesses Behavioral/Psych: Mood is stable, no new changes  All other systems were reviewed with the patient and are negative.  I have reviewed the past medical history, past surgical history, social history and family history with the patient and they are unchanged from  previous note.  ALLERGIES:  has No Known Allergies.  MEDICATIONS:  Current Outpatient Prescriptions  Medication Sig Dispense Refill  . acetaminophen (TYLENOL) 500 MG tablet Take 500-1,000 mg by mouth every 6 (six) hours as needed for mild pain.    Marland Kitchen amLODipine (NORVASC) 5 MG tablet Take 5 mg by mouth every morning.     Marland Kitchen dexamethasone (DECADRON) 4 MG tablet Take 1 tablet (4 mg total) by mouth 2 (two) times daily with a meal. 60 tablet 0  . diphenhydrAMINE (BENADRYL) 25 MG tablet Take 25 mg by mouth every 6 (six) hours as needed.    Marland Kitchen glimepiride (AMARYL) 2 MG tablet Take 2 mg by mouth every evening.   4  . lidocaine-prilocaine (EMLA) cream Apply to affected area once 30 g 3  . LORazepam (ATIVAN) 1 MG tablet Take 1 tablet (1 mg total) by mouth 2 (two) times daily as needed for anxiety. 40 tablet 0  . Multiple Vitamins-Minerals (CENTRUM SILVER PO) Take by mouth daily.    . Omega-3 Fatty Acids (FISH OIL PO) Take by mouth daily.    . ondansetron (ZOFRAN) 8 MG tablet Take 1 tablet (8 mg total) by mouth every 8 (eight) hours as needed. 60 tablet 1  . oxyCODONE-acetaminophen (PERCOCET) 5-325 MG per tablet Take one or two tablets by mouth every 4-6 hours as needed for pain. (Patient not taking: Reported on 01/06/2015) 50 tablet 0  . prochlorperazine (COMPAZINE) 10 MG tablet Take 1 tablet (10 mg total) by mouth every 6 (six) hours as needed (Nausea or vomiting). 60 tablet 1   No current facility-administered medications for this visit.    PHYSICAL EXAMINATION: ECOG PERFORMANCE STATUS: 0 - Asymptomatic  Filed Vitals:   01/06/15 0813  BP: 157/75  Pulse: 78  Temp: 98.1 F (36.7 C)  Resp:  18   Filed Weights   01/06/15 0813  Weight: 173 lb 4.8 oz (78.608 kg)    GENERAL:alert, no distress and comfortable SKIN: skin color, texture, turgor are normal, no rashes or significant lesions EYES: normal, Conjunctiva are pink and non-injected, sclera clear OROPHARYNX:no exudate, no erythema and lips,  buccal mucosa, and tongue normal  NECK: supple, thyroid normal size, non-tender, without nodularity LYMPH: Previous palpable lymphadenopathy on both [place] of the neck are getting smaller LUNGS: clear to auscultation and percussion with normal breathing effort HEART: regular rate & rhythm and no murmurs and no lower extremity edema ABDOMEN:abdomen soft, non-tender and normal bowel sounds. Feeding tube site looks okay Musculoskeletal:no cyanosis of digits and no clubbing  NEURO: alert & oriented x 3 with fluent speech, no focal motor/sensory deficits  LABORATORY DATA:  I have reviewed the data as listed    Component Value Date/Time   NA 130* 01/03/2015 1507   K 4.9 01/03/2015 1507   CO2 24 01/03/2015 1507   GLUCOSE 408* 01/03/2015 1507   BUN 51.4* 01/03/2015 1507   CREATININE 1.5* 01/03/2015 1507   CALCIUM 8.7 01/03/2015 1507   PROT 6.3* 01/03/2015 1507   ALBUMIN 3.2* 01/03/2015 1507   AST 13 01/03/2015 1507   ALT 26 01/03/2015 1507   ALKPHOS 60 01/03/2015 1507   BILITOT 0.30 01/03/2015 1507    No results found for: SPEP, UPEP  Lab Results  Component Value Date   WBC 10.3 01/03/2015   NEUTROABS 9.5* 01/03/2015   HGB 12.7* 01/03/2015   HCT 37.6* 01/03/2015   MCV 85.4 01/03/2015   PLT 222 01/03/2015      Chemistry      Component Value Date/Time   NA 130* 01/03/2015 1507   K 4.9 01/03/2015 1507   CO2 24 01/03/2015 1507   BUN 51.4* 01/03/2015 1507   CREATININE 1.5* 01/03/2015 1507      Component Value Date/Time   CALCIUM 8.7 01/03/2015 1507   ALKPHOS 60 01/03/2015 1507   AST 13 01/03/2015 1507   ALT 26 01/03/2015 1507   BILITOT 0.30 01/03/2015 1507     ASSESSMENT & PLAN:  Nasopharyngeal cancer  Overall, he tolerated treatment with expected side effects. The lymph nodes are smaller in size. We will continue the same treatment without dose adjustment. I gave him prescription dexamethasone to take after chemotherapy to reduce risk of nausea, vomiting and throat  swelling.    S/P gastrostomy Feeding tube site looks okay.  He will continue flushes. He is still able to tolerate 100% of food by mouth.   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 He can proceed with chemotherapy as long as creatinine remains under 2    Anemia in neoplastic disease This is likely due to recent treatment. The patient denies recent history of bleeding such as epistaxis, hematuria or hematochezia. He is asymptomatic from the anemia. I will observe for now.  He does not require transfusion now. I will continue the chemotherapy at current dose without dosage adjustment.  If the anemia gets progressive worse in the future, I might have to delay his treatment or adjust the chemotherapy dose.   Hyperglycemia due to type 2 diabetes mellitus His blood sugars quite high lately related to corticosteroids. He is doing well and able to maintain hydration. Continue to same.  No orders of the defined types were placed in this encounter.   All questions were answered. The patient knows to call the clinic with any problems, questions or concerns. No barriers to learning was detected. I spent 25 minutes counseling the patient face to face. The total time spent in the appointment was 30 minutes and more than 50% was on counseling and review of test results     [facility], [place], MD 01/07/2015 1:24 PM

## 2015-01-08 ENCOUNTER — Ambulatory Visit (HOSPITAL_BASED_OUTPATIENT_CLINIC_OR_DEPARTMENT_OTHER): Payer: Medicare Other

## 2015-01-08 ENCOUNTER — Ambulatory Visit
Admission: RE | Admit: 2015-01-08 | Discharge: 2015-01-08 | Disposition: A | Discharge status: Home / Self Care | Payer: Medicare Other | Source: Ambulatory Visit | Attending: Radiation Oncology | Admitting: Radiation Oncology

## 2015-01-08 DIAGNOSIS — C119 Malignant neoplasm of nasopharynx, unspecified: Secondary | ICD-10-CM | POA: Diagnosis not present

## 2015-01-08 MED ORDER — SODIUM CHLORIDE 0.9 % IV SOLN
Freq: Once | INTRAVENOUS | Status: AC
  Administered 2015-01-08: 13:00:00 via INTRAVENOUS

## 2015-01-08 MED ORDER — HEPARIN SOD (PORK) LOCK FLUSH 100 UNIT/ML IV SOLN
500.0000 [IU] | Freq: Once | INTRAVENOUS | Status: AC | PRN
  Administered 2015-01-08: 500 [IU]
  Filled 2015-01-08: qty 5

## 2015-01-08 MED ORDER — SODIUM CHLORIDE 0.9 % IJ SOLN
10.0000 mL | INTRAMUSCULAR | Status: DC | PRN
  Administered 2015-01-08: 10 mL
  Filled 2015-01-08: qty 10

## 2015-01-08 NOTE — Patient Instructions (Signed)
Dehydration, Adult Dehydration is when you lose more fluids from the body than you take in. Vital organs like the kidneys, brain, and heart cannot function without a proper amount of fluids and salt. Any loss of fluids from the body can cause dehydration.  CAUSES   Vomiting.  Diarrhea.  Excessive sweating.  Excessive urine output.  Fever. SYMPTOMS  Mild dehydration  Thirst.  Dry lips.  Slightly dry mouth. Moderate dehydration  Very dry mouth.  Sunken eyes.  Skin does not bounce back quickly when lightly pinched and released.  Dark urine and decreased urine production.  Decreased tear production.  Headache. Severe dehydration  Very dry mouth.  Extreme thirst.  Rapid, weak pulse (more than 100 beats per minute at rest).  Cold hands and feet.  Not able to sweat in spite of heat and temperature.  Rapid breathing.  Blue lips.  Confusion and lethargy.  Difficulty being awakened.  Minimal urine production.  No tears. DIAGNOSIS  Your caregiver will diagnose dehydration based on your symptoms and your exam. Blood and urine tests will help confirm the diagnosis. The diagnostic evaluation should also identify the cause of dehydration. TREATMENT  Treatment of mild or moderate dehydration can often be done at home by increasing the amount of fluids that you drink. It is best to drink small amounts of fluid more often. Drinking too much at one time can make vomiting worse. Refer to the home care instructions below. Severe dehydration needs to be treated at the hospital where you will probably be given intravenous (IV) fluids that contain water and electrolytes. HOME CARE INSTRUCTIONS   Ask your caregiver about specific rehydration instructions.  Drink enough fluids to keep your urine clear or pale yellow.  Drink small amounts frequently if you have nausea and vomiting.  Eat as you normally do.  Avoid:  Foods or drinks high in sugar.  Carbonated  drinks.  Juice.  Extremely hot or cold fluids.  Drinks with caffeine.  Fatty, greasy foods.  Alcohol.  Tobacco.  Overeating.  Gelatin desserts.  Wash your hands well to avoid spreading bacteria and viruses.  Only take over-the-counter or prescription medicines for pain, discomfort, or fever as directed by your caregiver.  Ask your caregiver if you should continue all prescribed and over-the-counter medicines.  Keep all follow-up appointments with your caregiver. SEEK MEDICAL CARE IF:  You have abdominal pain and it increases or stays in one area (localizes).  You have a rash, stiff neck, or severe headache.  You are irritable, sleepy, or difficult to awaken.  You are weak, dizzy, or extremely thirsty. SEEK IMMEDIATE MEDICAL CARE IF:   You are unable to keep fluids down or you get worse despite treatment.  You have frequent episodes of vomiting or diarrhea.  You have blood or green matter (bile) in your vomit.  You have blood in your stool or your stool looks black and tarry.  You have not urinated in 6 to 8 hours, or you have only urinated a small amount of very dark urine.  You have a fever.  You faint. MAKE SURE YOU:   Understand these instructions.  Will watch your condition.  Will get help right away if you are not doing well or get worse. Document Released: 06/28/2005 Document Revised: 09/20/2011 Document Reviewed: 02/15/2011 ExitCare Patient Information 2015 [employer]. This information is not intended to replace advice given to you by your health care provider. Make sure you discuss any questions you have with your health care   provider.  

## 2015-01-09 ENCOUNTER — Ambulatory Visit
Admission: RE | Admit: 2015-01-09 | Discharge: 2015-01-09 | Disposition: A | Discharge status: Home / Self Care | Payer: Medicare Other | Source: Ambulatory Visit | Attending: Radiation Oncology | Admitting: Radiation Oncology

## 2015-01-09 ENCOUNTER — Telehealth: Payer: Self-pay | Admitting: *Deleted

## 2015-01-09 ENCOUNTER — Encounter: Payer: Self-pay | Admitting: *Deleted

## 2015-01-09 ENCOUNTER — Other Ambulatory Visit: Payer: Self-pay | Admitting: Radiation Oncology

## 2015-01-09 ENCOUNTER — Ambulatory Visit (HOSPITAL_BASED_OUTPATIENT_CLINIC_OR_DEPARTMENT_OTHER): Payer: Medicare Other

## 2015-01-09 VITALS — BP 161/74 | HR 73 | Temp 98.0°F

## 2015-01-09 DIAGNOSIS — C119 Malignant neoplasm of nasopharynx, unspecified: Secondary | ICD-10-CM | POA: Diagnosis not present

## 2015-01-09 DIAGNOSIS — K1233 Oral mucositis (ulcerative) due to radiation: Secondary | ICD-10-CM

## 2015-01-09 MED ORDER — HEPARIN SOD (PORK) LOCK FLUSH 100 UNIT/ML IV SOLN
500.0000 [IU] | INTRAVENOUS | Status: AC | PRN
  Administered 2015-01-09: 500 [IU]
  Filled 2015-01-09: qty 5

## 2015-01-09 MED ORDER — MAGIC MOUTHWASH W/LIDOCAINE: 10.0000 mL | Freq: Four times a day (QID) | ORAL | Status: DC | PRN

## 2015-01-09 MED ORDER — SODIUM CHLORIDE 0.9 % IV SOLN
Freq: Once | INTRAVENOUS | Status: AC
  Administered 2015-01-09: 10:00:00 via INTRAVENOUS

## 2015-01-09 MED ORDER — SODIUM CHLORIDE 0.9 % IJ SOLN
10.0000 mL | INTRAMUSCULAR | Status: AC | PRN
  Administered 2015-01-09: 10 mL
  Filled 2015-01-09: qty 10

## 2015-01-09 NOTE — Telephone Encounter (Signed)
Oncology Nurse Navigator Documentation  Oncology Nurse Navigator Flowsheets 01/09/2015  Referral date to RadOnc/MedOnc -  Navigator Encounter Type Telephone  Patient Visit Type -  Treatment Phase -  Barriers/Navigation Needs -  Interventions -  Time Spent with Patient 15

## 2015-01-09 NOTE — Progress Notes (Signed)
Oncology Nurse Navigator Documentation  Oncology Nurse Navigator Flowsheets 01/09/2015  Referral date to RadOnc/MedOnc -  Navigator Encounter Type Clinic/MDC  Patient Visit Type Medonc  Treatment Phase Other  To provide support and encouragement, care continuity and to assess for needs, met with patient in Infusion where he was receiving IVF. He reported:  Mouth dry with thickening saliva.  He is drinking water, using salt water / baking soda rinse to manage.  Eating/drinking as usual.  Denies pain/discomfort with swallowing.  Expressed some concern that swallowing may become painful over the long weekend, asked for Rx for MMW or lidocaine.  I indicated I would notify Dr. Valere Dross.  Intermittent soreness in area of tooth extraction.  Not using PEG at this time for hydration or nutritional supplement.  Flushing BID.  He is tolerating RT without difficulty, without pre-medication.  Stopped taking ativan and benadryl last Friday prior to tmt. He did not express any needs, I encouraged him to contact me if that changes, he verbalized understanding.    Barriers/Navigation Needs -  Interventions -  Time Spent with Patient [place], RN, BSN, [place] at [place] [phone]

## 2015-01-09 NOTE — Patient Instructions (Signed)
Oral Mucositis Oral mucositis is a mouth condition that may develop from treatment used to cure cancer. With this condition, sores may appear on your lips, gums, tongue,and the roof or floor of your mouth. CAUSES  Oral mucositis can happen to anyone who is being treated with cancer therapies, including:  Cancer drugs (chemotherapy).  Radiation (X-ray or other high-energy rays) for head or neck cancer.  Bone marrow transplants and stem cell transplants. Oralmucositis is not caused by infection. However, the sores can become infected after they form. Infection can make oral mucositis worse. The following factors increase your risk of oral mucositis:  Poor oral hygiene.  Dental problems or oral diseases.  Smoking.  Chewing tobacco.  Drinking alcohol.  Having other diseases such as diabetes, human immunodeficiency virus (HIV), acquired immunodeficiency syndrome (AIDS), or kidney disease.  Not drinking enough water.  Having dentures that do not fit right.  Being a child. Children are more likely than adults to develop oral mucositis, but children usually heal more quickly.  Being elderly. Elderly adults are more likely to develop oral mucositis. SYMPTOMS  Symptoms vary. They may be mild or severe. Symptoms usually show up 7 to 10 days after starting treatment. Symptoms may include:   Sores in the mouth that bleed.  Color changes inside the mouth. Red, shiny areas appear.  White patches or pus in the mouth.  Pain in the mouth and throat.  Pain when talking.  Dryness and a burning feeling in the mouth.  Saliva that is dry and thick.  Trouble eating, drinking, and swallowing.  Weight loss and malnutrition. This happens because eating is a problem. DIAGNOSIS  A caregiver will check your mouth. Then, the condition is given a grade. This grading system will help your caregiver treat your condition:  Grade 1: The inside of the mouth is sore and red.  Grade 2: There  is redness in the mouth. Open sores are present. Swallowing food might be uncomfortable.  Grade 3: There are open sores. The mouth is very red. It is very hard to swallow food.  Grade 4: No food or drink can be swallowed. TREATMENT  Oral mucositis usually heals on its own. Sometimes, changes in the cancer treatment can help. Keeping the mouth as clean and germ free as possible is very important.  Medicine may ease the condition. Different types of medicine may be needed, such as:  An antibiotic to fight infection, if present.  Medicine to help mucosal cells heal more quickly.  A water-based moisturizer for your lips, if they are affected.  Methods to control pain may include:  Keeping your mouth moist. You may suck on ice chips or sugar-free frozen ice pops.  Pain relievers that are swished around in the mouth. They will make the mouth numb to ease the pain (topical anesthetics).  Specific mouth rinses.  Prescribed, medicated gels. The gel coats the mouth. This protects nerve endings and lowers pain.  Narcotic pain medicines. These are strong drugs. They may be used if pain is very bad.  Mouth care can keep the mouth as healthy as possible and help to prevent infection. Mouth care includes:  A dental checkup. Your dental caregiver will make sure you have no teeth problems that could cause infection. Try to have the dental checkup before you begin your treatment for cancer.  Brushing your teeth several times a day. Use a soft toothbrush. Change to a new brush often. Use only gentle toothpastes. Ask your caregiver what product would   be best for you. Make sure that you also floss your teeth.  Rinsing your mouth after every meal. Rinse again at bedtime. Do not use mouthwash that contains alcohol. Ask your caregiver what would be best for you. HOME CARE INSTRUCTIONS  Only take over-the-counter or prescription medicines for pain, discomfort, or fever as directed by your caregiver.  Follow the directions carefully.  Do not smoke.  Do not drink alcohol.  Eat only bland, soft foods until your mouth sores heal. Avoid sugary and acidic foods and drinks.  Ask your caregiver if you should add high-protein shakes to your diet to avoid malnutrition and weight loss.  Drink enough fluids to keep your urine clear or pale yellow.  If you have dentures, take them out often.  Continue to check your mouth every day for any signs of oral mucositis.  Keep all follow-up appointments. SEEK MEDICAL CARE IF:  You notice redness, soreness, or dryness in your mouth.  You have mouth or throat pain that makes it hard to swallow or speak. SEEK IMMEDIATE MEDICAL CARE IF:  Your pain in your mouth or throat gets worse and does not improve with pain medicine.  You have a lot of bleeding in your mouth.  You develop new, open sores in your mouth.  You notice patches of pus forming in your mouth.  You cannot swallow solid food or liquids.  You have a fever. Document Released: 02/12/2011 Document Revised: 09/20/2011 Document Reviewed: 02/12/2011 ExitCare Patient Information 2015 [employer]. This information is not intended to replace advice given to you by your health care provider. Make sure you discuss any questions you have with your health care provider.  

## 2015-01-09 NOTE — Telephone Encounter (Signed)
Oncology Nurse Navigator Documentation  Oncology Nurse Navigator Flowsheets 01/09/2015  Referral date to RadOnc/MedOnc -  Navigator Encounter Type Telephone  Called patient to inform Rx for MMW available for pick-up at [facility].  He verbalized understanding.  Patient Visit Type -  Treatment Phase -  Barriers/Navigation Needs -  Interventions -  Time Spent with Patient [place], RN, BSN, [place] at [place] [phone]

## 2015-01-10 ENCOUNTER — Ambulatory Visit
Admission: RE | Admit: 2015-01-10 | Discharge: 2015-01-10 | Disposition: A | Discharge status: Home / Self Care | Payer: Medicare Other | Source: Ambulatory Visit | Attending: Radiation Oncology | Admitting: Radiation Oncology

## 2015-01-10 ENCOUNTER — Other Ambulatory Visit (HOSPITAL_BASED_OUTPATIENT_CLINIC_OR_DEPARTMENT_OTHER): Payer: Medicare Other

## 2015-01-10 ENCOUNTER — Ambulatory Visit: Payer: Self-pay | Admitting: Hematology and Oncology

## 2015-01-10 ENCOUNTER — Ambulatory Visit (HOSPITAL_BASED_OUTPATIENT_CLINIC_OR_DEPARTMENT_OTHER): Payer: Medicare Other

## 2015-01-10 ENCOUNTER — Encounter: Payer: Self-pay | Admitting: *Deleted

## 2015-01-10 VITALS — BP 154/71 | HR 68 | Temp 98.6°F | Resp 18

## 2015-01-10 DIAGNOSIS — C119 Malignant neoplasm of nasopharynx, unspecified: Secondary | ICD-10-CM | POA: Diagnosis not present

## 2015-01-10 DIAGNOSIS — E860 Dehydration: Secondary | ICD-10-CM | POA: Diagnosis not present

## 2015-01-10 LAB — CBC WITH DIFFERENTIAL/PLATELET
BASO%: 0 % (ref 0.0–2.0)
Basophils Absolute: 0 10*3/uL (ref 0.0–0.1)
EOS ABS: 0 10*3/uL (ref 0.0–0.5)
EOS%: 0 % (ref 0.0–7.0)
HCT: 38.2 % — ABNORMAL LOW (ref 38.4–49.9)
HGB: 13.1 g/dL (ref 13.0–17.1)
LYMPH%: 4.6 % — ABNORMAL LOW (ref 14.0–49.0)
MCH: 28.7 pg (ref 27.2–33.4)
MCHC: 34.3 g/dL (ref 32.0–36.0)
MCV: 83.8 fL (ref 79.3–98.0)
MONO#: 0.6 10*3/uL (ref 0.1–0.9)
MONO%: 6.4 % (ref 0.0–14.0)
NEUT#: 8.8 10*3/uL — ABNORMAL HIGH (ref 1.5–6.5)
NEUT%: 89 % — AB (ref 39.0–75.0)
Platelets: 153 10*3/uL (ref 140–400)
RBC: 4.56 10*6/uL (ref 4.20–5.82)
RDW: 13.6 % (ref 11.0–14.6)
WBC: 9.9 10*3/uL (ref 4.0–10.3)
lymph#: 0.5 10*3/uL — ABNORMAL LOW (ref 0.9–3.3)

## 2015-01-10 LAB — COMPREHENSIVE METABOLIC PANEL (CC13)
ALT: 25 U/L (ref 0–55)
ANION GAP: 9 meq/L (ref 3–11)
AST: 15 U/L (ref 5–34)
Albumin: 2.7 g/dL — ABNORMAL LOW (ref 3.5–5.0)
Alkaline Phosphatase: 52 U/L (ref 40–150)
BUN: 50.5 mg/dL — AB (ref 7.0–26.0)
CO2: 21 mEq/L — ABNORMAL LOW (ref 22–29)
Calcium: 8.6 mg/dL (ref 8.4–10.4)
Chloride: 107 mEq/L (ref 98–109)
Creatinine: 1.3 mg/dL (ref 0.7–1.3)
EGFR: 58 mL/min/{1.73_m2} — ABNORMAL LOW (ref >90)
Glucose: 146 mg/dl — ABNORMAL HIGH (ref 70–140)
Potassium: 5.3 mEq/L — ABNORMAL HIGH (ref 3.5–5.1)
Sodium: 137 mEq/L (ref 136–145)
Total Bilirubin: 0.45 mg/dL (ref 0.20–1.20)
Total Protein: 5.4 g/dL — ABNORMAL LOW (ref 6.4–8.3)

## 2015-01-10 LAB — MAGNESIUM (CC13): Magnesium: 2.3 mg/dl (ref 1.5–2.5)

## 2015-01-10 MED ORDER — SODIUM CHLORIDE 0.9 % IJ SOLN
10.0000 mL | INTRAMUSCULAR | Status: DC | PRN
  Administered 2015-01-10: 10 mL
  Filled 2015-01-10: qty 10

## 2015-01-10 MED ORDER — SODIUM CHLORIDE 0.9 % IV SOLN
Freq: Once | INTRAVENOUS | Status: AC
  Administered 2015-01-10: 09:00:00 via INTRAVENOUS

## 2015-01-10 MED ORDER — HEPARIN SOD (PORK) LOCK FLUSH 100 UNIT/ML IV SOLN
500.0000 [IU] | Freq: Once | INTRAVENOUS | Status: AC | PRN
  Administered 2015-01-10: 500 [IU]
  Filled 2015-01-10: qty 5

## 2015-01-10 NOTE — Progress Notes (Signed)
Spoke with patient and wife in the hospital parking lot.  Pt is doing well and is planning on enjoying the holiday weekend.  Requested a refill on saline solution, cotton applicators and 4x4 split dressings.  Supplies gather and placed on [employer] desk to give to patient on tuesdays treatment.

## 2015-01-10 NOTE — Patient Instructions (Signed)
Dehydration, Adult Dehydration is when you lose more fluids from the body than you take in. Vital organs like the kidneys, brain, and heart cannot function without a proper amount of fluids and salt. Any loss of fluids from the body can cause dehydration.  CAUSES   Vomiting.  Diarrhea.  Excessive sweating.  Excessive urine output.  Fever. SYMPTOMS  Mild dehydration  Thirst.  Dry lips.  Slightly dry mouth. Moderate dehydration  Very dry mouth.  Sunken eyes.  Skin does not bounce back quickly when lightly pinched and released.  Dark urine and decreased urine production.  Decreased tear production.  Headache. Severe dehydration  Very dry mouth.  Extreme thirst.  Rapid, weak pulse (more than 100 beats per minute at rest).  Cold hands and feet.  Not able to sweat in spite of heat and temperature.  Rapid breathing.  Blue lips.  Confusion and lethargy.  Difficulty being awakened.  Minimal urine production.  No tears. DIAGNOSIS  Your caregiver will diagnose dehydration based on your symptoms and your exam. Blood and urine tests will help confirm the diagnosis. The diagnostic evaluation should also identify the cause of dehydration. TREATMENT  Treatment of mild or moderate dehydration can often be done at home by increasing the amount of fluids that you drink. It is best to drink small amounts of fluid more often. Drinking too much at one time can make vomiting worse. Refer to the home care instructions below. Severe dehydration needs to be treated at the hospital where you will probably be given intravenous (IV) fluids that contain water and electrolytes. HOME CARE INSTRUCTIONS   Ask your caregiver about specific rehydration instructions.  Drink enough fluids to keep your urine clear or pale yellow.  Drink small amounts frequently if you have nausea and vomiting.  Eat as you normally do.  Avoid:  Foods or drinks high in sugar.  Carbonated  drinks.  Juice.  Extremely hot or cold fluids.  Drinks with caffeine.  Fatty, greasy foods.  Alcohol.  Tobacco.  Overeating.  Gelatin desserts.  Wash your hands well to avoid spreading bacteria and viruses.  Only take over-the-counter or prescription medicines for pain, discomfort, or fever as directed by your caregiver.  Ask your caregiver if you should continue all prescribed and over-the-counter medicines.  Keep all follow-up appointments with your caregiver. SEEK MEDICAL CARE IF:  You have abdominal pain and it increases or stays in one area (localizes).  You have a rash, stiff neck, or severe headache.  You are irritable, sleepy, or difficult to awaken.  You are weak, dizzy, or extremely thirsty. SEEK IMMEDIATE MEDICAL CARE IF:   You are unable to keep fluids down or you get worse despite treatment.  You have frequent episodes of vomiting or diarrhea.  You have blood or green matter (bile) in your vomit.  You have blood in your stool or your stool looks black and tarry.  You have not urinated in 6 to 8 hours, or you have only urinated a small amount of very dark urine.  You have a fever.  You faint. MAKE SURE YOU:   Understand these instructions.  Will watch your condition.  Will get help right away if you are not doing well or get worse. Document Released: 06/28/2005 Document Revised: 09/20/2011 Document Reviewed: 02/15/2011 ExitCare Patient Information 2015 [employer]. This information is not intended to replace advice given to you by your health care provider. Make sure you discuss any questions you have with your health care   provider.  

## 2015-01-10 NOTE — Progress Notes (Signed)
Pt reports no nausea and does not feel he needs antiemetic today.

## 2015-01-14 ENCOUNTER — Encounter: Payer: Self-pay | Admitting: Hematology and Oncology

## 2015-01-14 ENCOUNTER — Ambulatory Visit (HOSPITAL_BASED_OUTPATIENT_CLINIC_OR_DEPARTMENT_OTHER): Payer: Medicare Other

## 2015-01-14 ENCOUNTER — Ambulatory Visit (HOSPITAL_BASED_OUTPATIENT_CLINIC_OR_DEPARTMENT_OTHER): Payer: Medicare Other | Admitting: Hematology and Oncology

## 2015-01-14 ENCOUNTER — Ambulatory Visit
Admission: RE | Admit: 2015-01-14 | Discharge: 2015-01-14 | Disposition: A | Discharge status: Home / Self Care | Payer: Medicare Other | Source: Ambulatory Visit | Attending: Radiation Oncology | Admitting: Radiation Oncology

## 2015-01-14 ENCOUNTER — Encounter: Payer: Self-pay | Admitting: Radiation Oncology

## 2015-01-14 ENCOUNTER — Ambulatory Visit: Payer: Medicare Other | Admitting: Nutrition

## 2015-01-14 VITALS — BP 172/80 | HR 75 | Resp 16 | Wt 176.3 lb

## 2015-01-14 VITALS — BP 159/70 | HR 70 | Temp 97.8°F | Resp 18 | Ht 68.0 in | Wt 172.0 lb

## 2015-01-14 DIAGNOSIS — E875 Hyperkalemia: Secondary | ICD-10-CM | POA: Insufficient documentation

## 2015-01-14 DIAGNOSIS — C119 Malignant neoplasm of nasopharynx, unspecified: Secondary | ICD-10-CM

## 2015-01-14 DIAGNOSIS — E1122 Type 2 diabetes mellitus with diabetic chronic kidney disease: Secondary | ICD-10-CM

## 2015-01-14 DIAGNOSIS — Z5111 Encounter for antineoplastic chemotherapy: Secondary | ICD-10-CM | POA: Diagnosis not present

## 2015-01-14 DIAGNOSIS — Z931 Gastrostomy status: Secondary | ICD-10-CM

## 2015-01-14 DIAGNOSIS — N182 Chronic kidney disease, stage 2 (mild): Secondary | ICD-10-CM

## 2015-01-14 MED ORDER — HEPARIN SOD (PORK) LOCK FLUSH 100 UNIT/ML IV SOLN
500.0000 [IU] | Freq: Once | INTRAVENOUS | Status: AC | PRN
  Administered 2015-01-14: 500 [IU]
  Filled 2015-01-14: qty 5

## 2015-01-14 MED ORDER — SODIUM CHLORIDE 0.9 % IV SOLN
Freq: Once | INTRAVENOUS | Status: AC
  Administered 2015-01-14: 09:00:00 via INTRAVENOUS

## 2015-01-14 MED ORDER — PALONOSETRON HCL INJECTION 0.25 MG/5ML
INTRAVENOUS | Status: AC
  Filled 2015-01-14: qty 5

## 2015-01-14 MED ORDER — SODIUM CHLORIDE 0.9 % IV SOLN
INTRAVENOUS | Status: DC
  Administered 2015-01-14: 12:00:00 via INTRAVENOUS

## 2015-01-14 MED ORDER — SODIUM CHLORIDE 0.9 % IJ SOLN
10.0000 mL | INTRAMUSCULAR | Status: DC | PRN
  Administered 2015-01-14: 10 mL
  Filled 2015-01-14: qty 10

## 2015-01-14 MED ORDER — PALONOSETRON HCL INJECTION 0.25 MG/5ML
0.2500 mg | Freq: Once | INTRAVENOUS | Status: AC
  Administered 2015-01-14: 0.25 mg via INTRAVENOUS

## 2015-01-14 MED ORDER — SODIUM CHLORIDE 0.9 % IV SOLN
30.0000 mg/m2 | Freq: Once | INTRAVENOUS | Status: AC
  Administered 2015-01-14: 59 mg via INTRAVENOUS
  Filled 2015-01-14: qty 59

## 2015-01-14 MED ORDER — SODIUM CHLORIDE 0.9 % IV SOLN
Freq: Once | INTRAVENOUS | Status: AC
  Administered 2015-01-14: 10:00:00 via INTRAVENOUS
  Filled 2015-01-14: qty 5

## 2015-01-14 NOTE — Assessment & Plan Note (Signed)
The cause is unknown. I will omit IV potassium from his premedication IVF from his chemotherapy this week.

## 2015-01-14 NOTE — Assessment & Plan Note (Signed)
Feeding tube site looks okay.  He will continue flushes. He is still able to tolerate 100% of food by mouth.

## 2015-01-14 NOTE — Progress Notes (Signed)
Nutrition follow-up completed with patient treated for nasopharyngeal cancer. Patient is flushing his feeding tube without difficulty. He is eating 100% of an oral diet with weight documented as 172 pounds. This is decreased from 173.3 pounds June 27. He eats a regular diet with foods chopped small and he drinks a protein drink once a day. Patient denies nutrition impact symptoms.  Nutrition diagnosis: Predicted suboptimal energy intake continues.  Intervention: Patient educated to continue strategies for increased oral intake to minimize weight loss. Patient will continue to flush feeding tube to ensure adequate hydration. I will begin tube feedings via PEG once weight drops 5% from usual body weight. Teach back method used.  Monitoring, evaluation, goals: Patient is tolerating adequate calories and protein.  Next visit: Monday, July 11, during infusion.  **Disclaimer: This note was dictated with voice recognition software. Similar sounding words can inadvertently be transcribed and this note may contain transcription errors which may not have been corrected upon publication of note.**

## 2015-01-14 NOTE — Progress Notes (Signed)
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   12/23/2014 -  Chemotherapy  he received weekly cisplatin   12/23/2014 -  Radiation Therapy  he received radiation treatment    INTERVAL HISTORY: Please see below for problem oriented charting. He is seen prior to cycle 4 of treatment. He is feeling good. Denies mucositis, nausea or vomiting. Lymphadenopathy is getting smaller. He is still able to eat 100% of fluid by mouth  REVIEW OF SYSTEMS:   Constitutional: Denies fevers, chills or abnormal weight loss Eyes: Denies blurriness of vision Ears, nose, mouth, throat, and face: Denies mucositis or sore throat Respiratory: Denies cough, dyspnea or wheezes Cardiovascular: Denies palpitation, chest discomfort or lower extremity swelling Gastrointestinal:  Denies nausea, heartburn or change in bowel habits Skin: Denies abnormal skin rashes Lymphatics: Denies new lymphadenopathy or easy bruising Neurological:Denies numbness, tingling or new weaknesses Behavioral/Psych: Mood is stable, no new changes  All other systems were reviewed with the patient and are negative.  I have reviewed the past medical history, past surgical history, social history and family history with the patient  and they are unchanged from previous note.  ALLERGIES:  has No Known Allergies.  MEDICATIONS:  Current Outpatient Prescriptions  Medication Sig Dispense Refill  . acetaminophen (TYLENOL) 500 MG tablet Take 500-1,000 mg by mouth every 6 (six) hours as needed for mild pain.    Marland Kitchen amLODipine (NORVASC) 5 MG tablet Take 5 mg by mouth every morning.     Marland Kitchen dexamethasone (DECADRON) 4 MG tablet Take 1 tablet (4 mg total) by mouth 2 (two) times daily with a meal. 60 tablet 0  . diphenhydrAMINE (BENADRYL) 25 MG tablet Take 25 mg by mouth every 6 (six) hours as needed.    Marland Kitchen glimepiride (AMARYL) 2 MG tablet Take 2 mg by mouth 2 (two) times daily.   4  . LORazepam (ATIVAN) 1 MG tablet Take 1 tablet (1 mg total) by mouth 2 (two) times daily as needed for anxiety. 40 tablet 0  . Multiple Vitamins-Minerals (CENTRUM SILVER PO) Take by mouth daily.    . Omega-3 Fatty Acids (FISH OIL PO) Take by mouth daily.    . ondansetron (ZOFRAN) 8 MG tablet Take 1 tablet (8 mg total) by mouth every 8 (eight) hours as needed. 60 tablet 1  . prochlorperazine (COMPAZINE) 10 MG tablet Take 1 tablet (10 mg total) by mouth every 6 (six) hours as needed (Nausea or vomiting). 60 tablet 1  . Alum & Mag Hydroxide-Simeth (MAGIC MOUTHWASH W/LIDOCAINE) SOLN Take 10 mLs by mouth 4 (four) times daily as needed for mouth pain. (Patient not taking: Reported on 01/14/2015) 500 mL 1  . lidocaine-prilocaine (EMLA) cream Apply to affected area once (Patient not taking: Reported on 01/14/2015) 30 g 3  . oxyCODONE-acetaminophen (PERCOCET) 5-325 MG per tablet Take one or two tablets by mouth every 4-6  hours as needed for pain. (Patient not taking: Reported on 01/06/2015) 50 tablet 0   No current facility-administered medications for this visit.   Facility-Administered Medications Ordered in Other Visits  Medication Dose Route Frequency Provider Last Rate Last Dose  . 0.9 %  sodium chloride infusion   Intravenous Continuous Heath Lark, MD      .  CISplatin (PLATINOL) 59 mg in sodium chloride 0.9 % 250 mL chemo infusion  30 mg/m2 (Treatment Plan Actual) Intravenous Once Heath Lark, MD      . fosaprepitant (EMEND) 150 mg, dexamethasone (DECADRON) 12 mg in sodium chloride 0.9 % 145 mL IVPB   Intravenous Once Heath Lark, MD      . heparin lock flush 100 unit/mL  500 Units Intracatheter Once PRN Heath Lark, MD      . palonosetron (ALOXI) injection 0.25 mg  0.25 mg Intravenous Once Heath Lark, MD      . sodium chloride 0.9 % injection 10 mL  10 mL Intracatheter PRN Heath Lark, MD        PHYSICAL EXAMINATION: ECOG PERFORMANCE STATUS: 0 - Asymptomatic  Filed Vitals:   01/14/15 0813  BP: 159/70  Pulse: 70  Temp: 97.8 F (36.6 C)  Resp: 18   Filed Weights   01/14/15 0813  Weight: 172 lb (78.019 kg)    GENERAL:alert, no distress and comfortable SKIN: skin color, texture, turgor are normal, no rashes or significant lesions EYES: normal, Conjunctiva are pink and non-injected, sclera clear OROPHARYNX: Noted mucositis. No thrush. NECK: supple, thyroid normal size, non-tender, without nodularity LYMPH:  Previously palpable lymphadenopathy continues to regress in size. LUNGS: clear to auscultation and percussion with normal breathing effort HEART: regular rate & rhythm and no murmurs and no lower extremity edema ABDOMEN:abdomen soft, non-tender and normal bowel sounds. Feeding tube site looks okay Musculoskeletal:no cyanosis of digits and no clubbing  NEURO: alert & oriented x 3 with fluent speech, no focal motor/sensory deficits  LABORATORY DATA:  I have reviewed the data as listed    Component Value Date/Time   NA 137 01/10/2015 0859   K 5.3* 01/10/2015 0859   CO2 21* 01/10/2015 0859   GLUCOSE 146* 01/10/2015 0859   BUN 50.5* 01/10/2015 0859   CREATININE 1.3 01/10/2015 0859   CALCIUM 8.6 01/10/2015 0859   PROT 5.4* 01/10/2015 0859   ALBUMIN 2.7* 01/10/2015 0859   AST 15 01/10/2015 0859   ALT 25 01/10/2015 0859   ALKPHOS 52  01/10/2015 0859   BILITOT 0.45 01/10/2015 0859    No results found for: SPEP, UPEP  Lab Results  Component Value Date   WBC 9.9 01/10/2015   NEUTROABS 8.8* 01/10/2015   HGB 13.1 01/10/2015   HCT 38.2* 01/10/2015   MCV 83.8 01/10/2015   PLT 153 01/10/2015      Chemistry      Component Value Date/Time   NA 137 01/10/2015 0859   K 5.3* 01/10/2015 0859   CO2 21* 01/10/2015 0859   BUN 50.5* 01/10/2015 0859   CREATININE 1.3 01/10/2015 0859      Component Value Date/Time   CALCIUM 8.6 01/10/2015 0859   ALKPHOS 52 01/10/2015 0859   AST 15 01/10/2015 0859   ALT 25 01/10/2015 0859   BILITOT 0.45 01/10/2015 0859     ASSESSMENT & PLAN:  Nasopharyngeal cancer  Overall, he tolerated treatment with expected side effects. The lymph nodes are smaller in size. We will continue the same treatment without dose adjustment. I gave him prescription dexamethasone to  take after chemotherapy to reduce risk of nausea, vomiting and throat swelling. The patient declined IV fluids this week and I will cancel his IV fluids for the next 2 days.   S/P gastrostomy Feeding tube site looks okay.  He will continue flushes. He is still able to tolerate 100% of food by mouth.     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 He can proceed with chemotherapy as long as creatinine remains under 2     Hyperkalemia The cause is unknown. I will omit IV potassium from his premedication IVF from his chemotherapy this week.   No orders of the defined types were placed in this encounter.   All questions were answered. The patient knows to call the clinic with any problems, questions or concerns. No barriers to learning was detected. I spent 20 minutes counseling the patient face to face. The  total time spent in the appointment was 30 minutes and more than 50% was on counseling and review of test results     [facility], [place], MD 01/14/2015 10:24 AM

## 2015-01-14 NOTE — Progress Notes (Signed)
Clarified hydration orders with Dr. Alvy Bimler. Pt to get one liter of NS at 750cc/hr before Cisplatin and one liter of NS at 750cc/hr after Cisplatin per Dr. Alvy Bimler.

## 2015-01-14 NOTE — Assessment & Plan Note (Signed)
Overall, he tolerated treatment with expected side effects. The lymph nodes are smaller in size. We will continue the same treatment without dose adjustment. I gave him prescription dexamethasone to take after chemotherapy to reduce risk of nausea, vomiting and throat swelling. The patient declined IV fluids this week and I will cancel his IV fluids for the next 2 days.

## 2015-01-14 NOTE — Progress Notes (Signed)
BP elevated. Weight stable. Denies pain or difficulty swallowing. Reports eating regular food chopped small and drinks one protein shake per day. Hyperpigmentation of bilateral neck noted without desquamation. Reports using Biafine as directed. Reports dry mouth.   BP 172/80 mmHg  Pulse 75  Resp 16  Wt 176 lb 4.8 oz (79.969 kg) Wt Readings from Last 3 Encounters:  01/14/15 176 lb 4.8 oz (79.969 kg)  01/14/15 172 lb (78.019 kg)  01/06/15 173 lb 12.8 oz (78.835 kg)

## 2015-01-14 NOTE — Assessment & Plan Note (Signed)
He has reduced creatinine clearance which is chronic I am willing to proceed with chemotherapy, dose adjustment by reducing chemotherapy by 25% weekly and aggressive fluid hydration. He can proceed with chemotherapy as long as creatinine remains under 2

## 2015-01-14 NOTE — Progress Notes (Signed)
Weekly Management Note:  Site: Nasopharynx/bilateral neck Current Dose:  3200  cGy Projected Dose: 7000  cGy  Narrative: The patient is seen today for routine under treatment assessment. CBCT/MVCT images/port films were reviewed. The chart was reviewed.   He continues to do well and has not yet used his PEG feeding tube.  He has been getting fluids because of his high BUN/creatinine ratio.  He tells me he is able to drink fluids without much difficulty.  He was started on Magic mouthwash with Xylocaine over the weekend and he says that this has been helpful.  His weight is up approximately 3 pounds over the past week.  He is not having much discomfort on swallowing.  Physical Examination:  Filed Vitals:   01/14/15 1528  BP: 172/80  Pulse: 75  Resp: 16  .  Weight: 176 lb 4.8 oz (79.969 kg).  There is still  palpable adenopathy, left greater than right.  There has been slight regression over the past week.  On inspection of the oropharynx there is a confluent mucositis along the soft palate and posterior tongue.  No candidiasis.  Lab Results  Component Value Date   WBC 9.9 01/10/2015   HGB 13.1 01/10/2015   HCT 38.2* 01/10/2015   MCV 83.8 01/10/2015   PLT 153 01/10/2015     Impression: Tolerating radiation therapy well.  It is unclear to me why he has a BUN creatinine ratio of 40 if he is truly drinking plenty of fluid.  I again discussed with him the possibility for left-sided hearing loss because the proximity his left cochlear higher dose regions.  He was wife accept this.  Plan: Continue radiation therapy as planned.

## 2015-01-14 NOTE — Patient Instructions (Signed)
[facility] Discharge Instructions for Patients Receiving Chemotherapy  Today you received the following chemotherapy agents: Cisplatin   To help prevent nausea and vomiting after your treatment, we encourage you to take your nausea medication as directed.    If you develop nausea and vomiting that is not controlled by your nausea medication, call the clinic.   BELOW ARE SYMPTOMS THAT SHOULD BE REPORTED IMMEDIATELY:  *FEVER GREATER THAN 100.5 F  *CHILLS WITH OR WITHOUT FEVER  NAUSEA AND VOMITING THAT IS NOT CONTROLLED WITH YOUR NAUSEA MEDICATION  *UNUSUAL SHORTNESS OF BREATH  *UNUSUAL BRUISING OR BLEEDING  TENDERNESS IN MOUTH AND THROAT WITH OR WITHOUT PRESENCE OF ULCERS  *URINARY PROBLEMS  *BOWEL PROBLEMS  UNUSUAL RASH Items with * indicate a potential emergency and should be followed up as soon as possible.  Feel free to call the clinic you have any questions or concerns. The clinic phone number is [phone].  Please show the CHEMO ALERT CARD at check-in to the Emergency Department and triage nurse.   

## 2015-01-15 ENCOUNTER — Ambulatory Visit
Admission: RE | Admit: 2015-01-15 | Discharge: 2015-01-15 | Disposition: A | Discharge status: Home / Self Care | Payer: Medicare Other | Source: Ambulatory Visit | Attending: Radiation Oncology | Admitting: Radiation Oncology

## 2015-01-15 ENCOUNTER — Ambulatory Visit: Payer: Self-pay

## 2015-01-15 ENCOUNTER — Ambulatory Visit: Payer: Medicare Other

## 2015-01-15 DIAGNOSIS — C119 Malignant neoplasm of nasopharynx, unspecified: Secondary | ICD-10-CM | POA: Diagnosis not present

## 2015-01-16 ENCOUNTER — Ambulatory Visit: Payer: Self-pay

## 2015-01-16 ENCOUNTER — Ambulatory Visit
Admission: RE | Admit: 2015-01-16 | Discharge: 2015-01-16 | Disposition: A | Discharge status: Home / Self Care | Payer: Medicare Other | Source: Ambulatory Visit | Attending: Radiation Oncology | Admitting: Radiation Oncology

## 2015-01-16 DIAGNOSIS — C119 Malignant neoplasm of nasopharynx, unspecified: Secondary | ICD-10-CM | POA: Diagnosis not present

## 2015-01-17 ENCOUNTER — Other Ambulatory Visit (HOSPITAL_BASED_OUTPATIENT_CLINIC_OR_DEPARTMENT_OTHER): Payer: Medicare Other

## 2015-01-17 ENCOUNTER — Ambulatory Visit: Payer: Self-pay

## 2015-01-17 ENCOUNTER — Encounter: Payer: Self-pay | Admitting: Hematology and Oncology

## 2015-01-17 ENCOUNTER — Ambulatory Visit
Admission: RE | Admit: 2015-01-17 | Discharge: 2015-01-17 | Disposition: A | Discharge status: Home / Self Care | Payer: Medicare Other | Source: Ambulatory Visit | Attending: Radiation Oncology | Admitting: Radiation Oncology

## 2015-01-17 ENCOUNTER — Ambulatory Visit (HOSPITAL_BASED_OUTPATIENT_CLINIC_OR_DEPARTMENT_OTHER): Payer: Medicare Other | Admitting: Hematology and Oncology

## 2015-01-17 ENCOUNTER — Telehealth: Payer: Self-pay | Admitting: Hematology and Oncology

## 2015-01-17 ENCOUNTER — Encounter: Payer: Self-pay | Admitting: *Deleted

## 2015-01-17 ENCOUNTER — Encounter: Payer: Self-pay | Admitting: Pharmacist

## 2015-01-17 VITALS — BP 149/71 | HR 68 | Temp 98.5°F | Resp 18 | Ht 68.0 in | Wt 170.4 lb

## 2015-01-17 DIAGNOSIS — D6959 Other secondary thrombocytopenia: Secondary | ICD-10-CM | POA: Diagnosis not present

## 2015-01-17 DIAGNOSIS — N182 Chronic kidney disease, stage 2 (mild): Secondary | ICD-10-CM

## 2015-01-17 DIAGNOSIS — R609 Edema, unspecified: Secondary | ICD-10-CM

## 2015-01-17 DIAGNOSIS — C119 Malignant neoplasm of nasopharynx, unspecified: Secondary | ICD-10-CM | POA: Diagnosis not present

## 2015-01-17 DIAGNOSIS — T50905A Adverse effect of unspecified drugs, medicaments and biological substances, initial encounter: Secondary | ICD-10-CM

## 2015-01-17 DIAGNOSIS — R600 Localized edema: Secondary | ICD-10-CM

## 2015-01-17 DIAGNOSIS — E875 Hyperkalemia: Secondary | ICD-10-CM

## 2015-01-17 DIAGNOSIS — Z931 Gastrostomy status: Secondary | ICD-10-CM

## 2015-01-17 DIAGNOSIS — E1122 Type 2 diabetes mellitus with diabetic chronic kidney disease: Secondary | ICD-10-CM

## 2015-01-17 LAB — CBC WITH DIFFERENTIAL/PLATELET
BASO%: 0 % (ref 0.0–2.0)
Basophils Absolute: 0 10*3/uL (ref 0.0–0.1)
EOS ABS: 0 10*3/uL (ref 0.0–0.5)
EOS%: 0 % (ref 0.0–7.0)
HCT: 39.4 % (ref 38.4–49.9)
HEMOGLOBIN: 13.8 g/dL (ref 13.0–17.1)
LYMPH%: 3.8 % — ABNORMAL LOW (ref 14.0–49.0)
MCH: 29.2 pg (ref 27.2–33.4)
MCHC: 35 g/dL (ref 32.0–36.0)
MCV: 83.5 fL (ref 79.3–98.0)
MONO#: 0.3 10*3/uL (ref 0.1–0.9)
MONO%: 3.5 % (ref 0.0–14.0)
NEUT%: 92.7 % — ABNORMAL HIGH (ref 39.0–75.0)
NEUTROS ABS: 8 10*3/uL — AB (ref 1.5–6.5)
Platelets: 111 10*3/uL — ABNORMAL LOW (ref 140–400)
RBC: 4.72 10*6/uL (ref 4.20–5.82)
RDW: 13.8 % (ref 11.0–14.6)
WBC: 8.6 10*3/uL (ref 4.0–10.3)
lymph#: 0.3 10*3/uL — ABNORMAL LOW (ref 0.9–3.3)

## 2015-01-17 LAB — COMPREHENSIVE METABOLIC PANEL (CC13)
ALBUMIN: 2.4 g/dL — AB (ref 3.5–5.0)
ALK PHOS: 57 U/L (ref 40–150)
ALT: 32 U/L (ref 0–55)
AST: 21 U/L (ref 5–34)
Anion Gap: 8 mEq/L (ref 3–11)
BUN: 54.3 mg/dL — AB (ref 7.0–26.0)
CALCIUM: 8.5 mg/dL (ref 8.4–10.4)
CHLORIDE: 100 meq/L (ref 98–109)
CO2: 24 meq/L (ref 22–29)
Creatinine: 1.5 mg/dL — ABNORMAL HIGH (ref 0.7–1.3)
EGFR: 47 mL/min/{1.73_m2} — AB (ref >90)
GLUCOSE: 241 mg/dL — AB (ref 70–140)
Potassium: 5.4 mEq/L — ABNORMAL HIGH (ref 3.5–5.1)
SODIUM: 132 meq/L — AB (ref 136–145)
Total Bilirubin: 0.36 mg/dL (ref 0.20–1.20)
Total Protein: 5.2 g/dL — ABNORMAL LOW (ref 6.4–8.3)

## 2015-01-17 LAB — MAGNESIUM (CC13): Magnesium: 2.1 mg/dl (ref 1.5–2.5)

## 2015-01-17 NOTE — Progress Notes (Signed)
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   12/23/2014 -  Chemotherapy  he received weekly cisplatin   12/23/2014 -  Radiation Therapy  he received radiation treatment    INTERVAL HISTORY: Please see below for problem oriented charting. He feels well. Denies any mucositis or dysphagia. He complained of bilateral lower extremity edema.  REVIEW OF SYSTEMS:   Constitutional: Denies fevers, chills or abnormal weight loss Eyes: Denies blurriness of vision Ears, nose, mouth, throat, and face: Denies mucositis or sore throat Respiratory: Denies cough, dyspnea or wheezes Cardiovascular: Denies palpitation, chest discomfort  Gastrointestinal:  Denies nausea, heartburn or change in bowel habits Skin: Denies abnormal skin rashes Lymphatics: Denies new lymphadenopathy or easy bruising Neurological:Denies numbness, tingling or new weaknesses Behavioral/Psych: Mood is stable, no new changes  All other systems were reviewed with the patient and are negative.  I have reviewed the past medical history, past surgical history, social history and family history with the patient and they are unchanged from previous note.  ALLERGIES:  has No Known Allergies.  MEDICATIONS:  Current  Outpatient Prescriptions  Medication Sig Dispense Refill  . acetaminophen (TYLENOL) 500 MG tablet Take 500-1,000 mg by mouth every 6 (six) hours as needed for mild pain.    Marland Kitchen Alum & Mag Hydroxide-Simeth (MAGIC MOUTHWASH W/LIDOCAINE) SOLN Take 10 mLs by mouth 4 (four) times daily as needed for mouth pain. 500 mL 1  . amLODipine (NORVASC) 5 MG tablet Take 5 mg by mouth every morning.     Marland Kitchen dexamethasone (DECADRON) 4 MG tablet Take 1 tablet (4 mg total) by mouth 2 (two) times daily with a meal. 60 tablet 0  . diphenhydrAMINE (BENADRYL) 25 MG tablet Take 25 mg by mouth every 6 (six) hours as needed.    Marland Kitchen glimepiride (AMARYL) 2 MG tablet Take 2 mg by mouth 2 (two) times daily.   4  . lidocaine-prilocaine (EMLA) cream Apply to affected area once 30 g 3  . LORazepam (ATIVAN) 1 MG tablet Take 1 tablet (1 mg total) by mouth 2 (two) times daily as needed for anxiety. 40 tablet 0  . Multiple Vitamins-Minerals (CENTRUM SILVER PO) Take by mouth daily.    . Omega-3 Fatty Acids (FISH OIL PO) Take by mouth daily.    . ondansetron (ZOFRAN) 8 MG tablet Take 1 tablet (8 mg total) by mouth every 8 (eight) hours as needed. 60 tablet 1  . oxyCODONE-acetaminophen (PERCOCET) 5-325 MG per tablet Take one or two tablets by mouth every 4-6 hours as needed for pain. 50 tablet 0  . prochlorperazine (COMPAZINE) 10 MG tablet Take 1 tablet (10 mg total) by mouth every 6 (six) hours as needed (Nausea or vomiting). 60 tablet 1   No current facility-administered medications for this visit.    PHYSICAL EXAMINATION: ECOG PERFORMANCE STATUS: 1 - Symptomatic but completely ambulatory  Filed  Vitals:   01/17/15 1058  BP: 149/71  Pulse: 68  Temp: 98.5 F (36.9 C)  Resp: 18   Filed Weights   01/17/15 1058  Weight: 170 lb 6.4 oz (77.293 kg)    GENERAL:alert, no distress and comfortable SKIN: skin color, texture, turgor are normal, no rashes or significant lesions EYES: normal, Conjunctiva are pink and non-injected, sclera  clear OROPHARYNX:no exudate, no erythema and lips, buccal mucosa, and tongue normal  NECK: supple, thyroid normal size, non-tender, without nodularity LYMPH:  Previously palpable lymphadenopathy is improving. LUNGS: clear to auscultation and percussion with normal breathing effort HEART: regular rate & rhythm and no murmurs with moderate bilateral lower extremity edema ABDOMEN:abdomen soft, non-tender and normal bowel sounds Musculoskeletal:no cyanosis of digits and no clubbing  NEURO: alert & oriented x 3 with fluent speech, no focal motor/sensory deficits  LABORATORY DATA:  I have reviewed the data as listed    Component Value Date/Time   NA 132* 01/17/2015 1045   K 5.4* 01/17/2015 1045   CO2 24 01/17/2015 1045   GLUCOSE 241* 01/17/2015 1045   BUN 54.3* 01/17/2015 1045   CREATININE 1.5* 01/17/2015 1045   CALCIUM 8.5 01/17/2015 1045   PROT 5.2* 01/17/2015 1045   ALBUMIN 2.4* 01/17/2015 1045   AST 21 01/17/2015 1045   ALT 32 01/17/2015 1045   ALKPHOS 57 01/17/2015 1045   BILITOT 0.36 01/17/2015 1045    No results found for: SPEP, UPEP  Lab Results  Component Value Date   WBC 8.6 01/17/2015   NEUTROABS 8.0* 01/17/2015   HGB 13.8 01/17/2015   HCT 39.4 01/17/2015   MCV 83.5 01/17/2015   PLT 111* 01/17/2015      Chemistry      Component Value Date/Time   NA 132* 01/17/2015 1045   K 5.4* 01/17/2015 1045   CO2 24 01/17/2015 1045   BUN 54.3* 01/17/2015 1045   CREATININE 1.5* 01/17/2015 1045      Component Value Date/Time   CALCIUM 8.5 01/17/2015 1045   ALKPHOS 57 01/17/2015 1045   AST 21 01/17/2015 1045   ALT 32 01/17/2015 1045   BILITOT 0.36 01/17/2015 1045      ASSESSMENT & PLAN:  Nasopharyngeal cancer Overall, he tolerated treatment with expected side effects. The lymph nodes are smaller in size. We will continue the same treatment without dose adjustment. I gave him prescription dexamethasone to take after chemotherapy to reduce risk of nausea, vomiting  and throat swelling. The patient declined IV fluids this week and I will cancel his IV fluids   Hyperkalemia This is related to excessive potassium-rich diet. I recommend he stop eating pickles. I will eliminate potassium from his IV fluids next week.  S/P gastrostomy Feeding tube site looks okay.  He will continue flushes. He is still able to tolerate 100% of food by mouth.    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 He can proceed with chemotherapy as long as creatinine remains under 2    Thrombocytopenia due to drugs This is likely due to recent treatment. The patient denies recent history of bleeding such as epistaxis, hematuria or hematochezia. He is asymptomatic from the low platelet count. I will observe for now.  he does not require transfusion now. I will continue the chemotherapy at current dose without dosage adjustment.  If the  thrombocytopenia gets progressive worse in the future, I might have to delay his treatment or adjust the chemotherapy dose.    Bilateral lower extremity edema This is related to low albumin state. I recommended increase physical activity as tolerated. He does not need diuretic therapy   No orders of the defined types were placed in this encounter.   All questions were answered. The patient knows to call the clinic with any problems, questions or concerns. No barriers to learning was detected. I spent 25 minutes counseling the patient face to face. The total time spent in the appointment was 30 minutes and more than 50% was on counseling and review of test results     [facility], Annalyse Langlais, MD 01/17/2015 4:10 PM

## 2015-01-17 NOTE — Assessment & Plan Note (Addendum)
Overall, he tolerated treatment with expected side effects. The lymph nodes are smaller in size. We will continue the same treatment without dose adjustment. I gave him prescription dexamethasone to take after chemotherapy to reduce risk of nausea, vomiting and throat swelling. The patient declined IV fluids this week and I will cancel his IV fluids

## 2015-01-17 NOTE — Assessment & Plan Note (Signed)
This is likely due to recent treatment. The patient denies recent history of bleeding such as epistaxis, hematuria or hematochezia. He is asymptomatic from the low platelet count. I will observe for now.  he does not require transfusion now. I will continue the chemotherapy at current dose without dosage adjustment.  If the thrombocytopenia gets progressive worse in the future, I might have to delay his treatment or adjust the chemotherapy dose.   

## 2015-01-17 NOTE — Assessment & Plan Note (Signed)
Feeding tube site looks okay.  He will continue flushes. He is still able to tolerate 100% of food by mouth.

## 2015-01-17 NOTE — Assessment & Plan Note (Signed)
This is related to excessive potassium-rich diet. I recommend he stop eating pickles. I will eliminate potassium from his IV fluids next week.

## 2015-01-17 NOTE — Telephone Encounter (Signed)
per pof to sch pt appt-sent [facility] staff message to override pt appt-rried to call and got a voicemail-will call pt after reply-pt will be back on 7/11 and can give updated copy then

## 2015-01-17 NOTE — Assessment & Plan Note (Signed)
This is related to low albumin state. I recommended increase physical activity as tolerated. He does not need diuretic therapy

## 2015-01-17 NOTE — Assessment & Plan Note (Signed)
He has reduced creatinine clearance which is chronic I am willing to proceed with chemotherapy, dose adjustment by reducing chemotherapy by 25% weekly and aggressive fluid hydration. He can proceed with chemotherapy as long as creatinine remains under 2

## 2015-01-18 NOTE — Progress Notes (Signed)
Oncology Nurse Navigator Documentation   Navigator Encounter Type: Clinic/MDC (01/17/15 1125)  To provide support and encouragement, care continuity and to assess for needs, with with patient during f/u appt with Dr. Alvy Bimler. He reported tolerance of RT, eating/drinking at baseline, absence of SEs with exception of dry mouth. He stated he will need more Biafine shortly, has enough through weekend.  I guided him to request additional tube during his WUT next week. He did not express any needs or concerns at this time, I encouraged him to contact me if that changes, he verbalized understanding.  Gayleen Orem, RN, BSN, [place] [facility] [place] [phone]

## 2015-01-20 ENCOUNTER — Ambulatory Visit
Admission: RE | Admit: 2015-01-20 | Discharge: 2015-01-20 | Disposition: A | Discharge status: Home / Self Care | Payer: Medicare Other | Source: Ambulatory Visit | Attending: Radiation Oncology | Admitting: Radiation Oncology

## 2015-01-20 ENCOUNTER — Ambulatory Visit: Payer: Medicare Other | Admitting: Nutrition

## 2015-01-20 ENCOUNTER — Ambulatory Visit (HOSPITAL_BASED_OUTPATIENT_CLINIC_OR_DEPARTMENT_OTHER): Payer: Medicare Other

## 2015-01-20 ENCOUNTER — Encounter: Payer: Self-pay | Admitting: Radiation Oncology

## 2015-01-20 VITALS — BP 137/70 | HR 81 | Temp 98.9°F | Resp 17

## 2015-01-20 VITALS — BP 169/86 | HR 79 | Temp 97.9°F | Resp 20 | Wt 170.4 lb

## 2015-01-20 DIAGNOSIS — Z510 Encounter for antineoplastic radiation therapy: Secondary | ICD-10-CM | POA: Insufficient documentation

## 2015-01-20 DIAGNOSIS — C119 Malignant neoplasm of nasopharynx, unspecified: Secondary | ICD-10-CM | POA: Insufficient documentation

## 2015-01-20 DIAGNOSIS — Z5111 Encounter for antineoplastic chemotherapy: Secondary | ICD-10-CM | POA: Diagnosis not present

## 2015-01-20 MED ORDER — HEPARIN SOD (PORK) LOCK FLUSH 100 UNIT/ML IV SOLN
500.0000 [IU] | Freq: Once | INTRAVENOUS | Status: AC | PRN
  Administered 2015-01-20: 500 [IU]
  Filled 2015-01-20: qty 5

## 2015-01-20 MED ORDER — PALONOSETRON HCL INJECTION 0.25 MG/5ML
INTRAVENOUS | Status: AC
  Filled 2015-01-20: qty 5

## 2015-01-20 MED ORDER — BIAFINE EX EMUL
Freq: Every day | CUTANEOUS | Status: DC
  Administered 2015-01-20: 16:00:00 via TOPICAL

## 2015-01-20 MED ORDER — SODIUM CHLORIDE 0.9 % IJ SOLN
10.0000 mL | INTRAMUSCULAR | Status: DC | PRN
  Administered 2015-01-20: 10 mL
  Filled 2015-01-20: qty 10

## 2015-01-20 MED ORDER — CISPLATIN CHEMO INJECTION 100MG/100ML
30.0000 mg/m2 | Freq: Once | INTRAVENOUS | Status: AC
  Administered 2015-01-20: 59 mg via INTRAVENOUS
  Filled 2015-01-20: qty 59

## 2015-01-20 MED ORDER — MAGNESIUM SULFATE 50 % IJ SOLN
Freq: Once | INTRAVENOUS | Status: AC
  Administered 2015-01-20: 09:00:00 via INTRAVENOUS
  Filled 2015-01-20: qty 1000

## 2015-01-20 MED ORDER — SODIUM CHLORIDE 0.9 % IV SOLN
Freq: Once | INTRAVENOUS | Status: AC
  Administered 2015-01-20: 12:00:00 via INTRAVENOUS
  Filled 2015-01-20: qty 5

## 2015-01-20 MED ORDER — PALONOSETRON HCL INJECTION 0.25 MG/5ML
0.2500 mg | Freq: Once | INTRAVENOUS | Status: AC
  Administered 2015-01-20: 0.25 mg via INTRAVENOUS

## 2015-01-20 NOTE — Progress Notes (Signed)
Weekly rad txs nasopharynx b/l neck 20/35 txs, mild erythema on neck, skin intact, gave another biafine cream per request had chemo today, has peg tube just flushing with water  590ml in am and 589ml at niht, eating most foods, has ulcers on tongue,mucositis, using MMW and baking soda ,salt rinses, takes zofran in am and compazine at night to keep nausea at bay, decadron 4mg  bid, had stopped last week, Saturday had  Coughing up blood and sweeling in neck, took decadron and it resolved  In 2 hours stated, pain on tongue, has bandaid on chin, says was shaving and cut it,won'y clear up, bought electric shaver since then 3:40 PM BP 169/86 mmHg  Pulse 79  Temp(Src) 97.9 F (36.6 C) (Oral)  Resp 20  Wt 170 lb 6.4 oz (77.293 kg)  SpO2 99%  Wt Readings from Last 3 Encounters:  01/20/15 170 lb 6.4 oz (77.293 kg)  01/17/15 170 lb 6.4 oz (77.293 kg)  01/14/15 176 lb 4.8 oz (79.969 kg)

## 2015-01-20 NOTE — Progress Notes (Signed)
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 Patient continues to flush his feeding tube with water daily. Continues to tolerate an oral diet. Weight has decreased slightly and was documented as 170.4 pounds July 8, down from 173.3 pounds June 27.  This is a 3% decrease from usual body weight. Patient continues a soft diet with a protein shake daily. He reports he is beginning to develop mouth sores and has been gargling with baking soda and salt. Patient has bilateral lower extremity edema.  He has been told to increase physical activity. Labs on July 8 include potassium 5.4, glucose 241, BUN 54.3, creatinine 1.5, albumin 2.4.  Nutrition diagnosis: Predicted suboptimal energy intake continues.  Estimated nutrition needs: 2200-2400 calories, 110-125 g protein, 2.4 L fluid.  Intervention: Patient educated to continue strategies for adequate calories and protein as well as fluid intake. Provided samples of Glucerna for patient to take by mouth or to make shakes. Patient will continue to flush his feeding tube with water daily. Begin enteral tube feeding once weight loss reflects 5% loss from usual body weight. Questions were answered.  Teach back method used.  Monitoring, evaluation, goals: Patient is tolerating adequate calories and protein by mouth.  Next visit: Monday, July 25, during infusion.  **Disclaimer: This note was dictated with voice recognition software. Similar sounding words can inadvertently be transcribed and this note may contain transcription errors which may not have been corrected upon publication of note.**

## 2015-01-20 NOTE — Patient Instructions (Signed)
[facility] Discharge Instructions for Patients Receiving Chemotherapy  Today you received the following chemotherapy agents Cisplatin  To help prevent nausea and vomiting after your treatment, we encourage you to take your nausea medication Compazine 10mg  every 6 hours or Zofran 8mg  every 8 hours as needed.   If you develop nausea and vomiting that is not controlled by your nausea medication, call the clinic.   BELOW ARE SYMPTOMS THAT SHOULD BE REPORTED IMMEDIATELY:  *FEVER GREATER THAN 100.5 F  *CHILLS WITH OR WITHOUT FEVER  NAUSEA AND VOMITING THAT IS NOT CONTROLLED WITH YOUR NAUSEA MEDICATION  *UNUSUAL SHORTNESS OF BREATH  *UNUSUAL BRUISING OR BLEEDING  TENDERNESS IN MOUTH AND THROAT WITH OR WITHOUT PRESENCE OF ULCERS  *URINARY PROBLEMS  *BOWEL PROBLEMS  UNUSUAL RASH Items with * indicate a potential emergency and should be followed up as soon as possible.  Feel free to call the clinic you have any questions or concerns. The clinic phone number is [phone].  Please show the [facility] at check-in to the Emergency Department and triage nurse.

## 2015-01-20 NOTE — Progress Notes (Signed)
   Weekly Management Note:  Outpatient    ICD-9-CM ICD-10-CM   1. Nasopharyngeal cancer 147.9 C11.9 topical emolient (BIAFINE) emulsion    Current Dose:  40 Gy  Projected Dose: 70 Gy   Narrative:  The patient presents for routine under treatment assessment.  CBCT/MVCT images/Port film x-rays were reviewed.  The chart was checked. Doing well. Dry mouth helped by Xylimelts.  Tongue pain helped by lidocaine. Eating most foods.  Physical Findings:  Wt Readings from Last 3 Encounters:  01/20/15 170 lb 6.4 oz (77.293 kg)  01/17/15 170 lb 6.4 oz (77.293 kg)  01/14/15 176 lb 4.8 oz (79.969 kg)    weight is 170 lb 6.4 oz (77.293 kg). His oral temperature is 97.9 F (36.6 C). His blood pressure is 169/86 and his pulse is 79. His respiration is 20 and oxygen saturation is 99%.  NAD, ulcer on central oral tongue. Confluent oropharyngeal mucositis. Skin intact  CBC    Component Value Date/Time   WBC 8.6 01/17/2015 1045   WBC 5.3 12/17/2014 0938   RBC 4.72 01/17/2015 1045   RBC 4.60 12/17/2014 0938   HGB 13.8 01/17/2015 1045   HGB 13.4 12/17/2014 0938   HCT 39.4 01/17/2015 1045   HCT 39.1 12/17/2014 0938   PLT 111* 01/17/2015 1045   PLT 212 12/17/2014 0938   MCV 83.5 01/17/2015 1045   MCV 85.0 12/17/2014 0938   MCH 29.2 01/17/2015 1045   MCH 29.1 12/17/2014 0938   MCHC 35.0 01/17/2015 1045   MCHC 34.3 12/17/2014 0938   RDW 13.8 01/17/2015 1045   RDW 13.0 12/17/2014 0938   LYMPHSABS 0.3* 01/17/2015 1045   LYMPHSABS 0.8 12/17/2014 0938   MONOABS 0.3 01/17/2015 1045   MONOABS 0.6 12/17/2014 0938   EOSABS 0.0 01/17/2015 1045   EOSABS 0.3 12/17/2014 0938   BASOSABS 0.0 01/17/2015 1045   BASOSABS 0.0 12/17/2014 0938     CMP     Component Value Date/Time   NA 132* 01/17/2015 1045   K 5.4* 01/17/2015 1045   CO2 24 01/17/2015 1045   GLUCOSE 241* 01/17/2015 1045   BUN 54.3* 01/17/2015 1045   CREATININE 1.5* 01/17/2015 1045   CALCIUM 8.5 01/17/2015 1045   PROT 5.2* 01/17/2015  1045   ALBUMIN 2.4* 01/17/2015 1045   AST 21 01/17/2015 1045   ALT 32 01/17/2015 1045   ALKPHOS 57 01/17/2015 1045   BILITOT 0.36 01/17/2015 1045     Impression:  The patient is tolerating radiotherapy.   Plan:  Continue radiotherapy as planned.  May use lidocaine MMW once per hour if spitting out and only swishing 7mL at a time. -----------------------------------  Eppie Gibson, MD

## 2015-01-21 ENCOUNTER — Ambulatory Visit
Admission: RE | Admit: 2015-01-21 | Discharge: 2015-01-21 | Disposition: A | Discharge status: Home / Self Care | Payer: Medicare Other | Source: Ambulatory Visit | Attending: Radiation Oncology | Admitting: Radiation Oncology

## 2015-01-21 ENCOUNTER — Encounter: Payer: Self-pay | Admitting: *Deleted

## 2015-01-21 ENCOUNTER — Ambulatory Visit (HOSPITAL_BASED_OUTPATIENT_CLINIC_OR_DEPARTMENT_OTHER): Payer: Medicare Other

## 2015-01-21 VITALS — BP 152/68 | HR 68 | Temp 97.9°F | Resp 18

## 2015-01-21 DIAGNOSIS — C119 Malignant neoplasm of nasopharynx, unspecified: Secondary | ICD-10-CM | POA: Diagnosis not present

## 2015-01-21 DIAGNOSIS — E860 Dehydration: Secondary | ICD-10-CM | POA: Diagnosis not present

## 2015-01-21 MED ORDER — HEPARIN SOD (PORK) LOCK FLUSH 100 UNIT/ML IV SOLN
500.0000 [IU] | Freq: Once | INTRAVENOUS | Status: AC | PRN
  Administered 2015-01-21: 500 [IU]
  Filled 2015-01-21: qty 5

## 2015-01-21 MED ORDER — HEPARIN SOD (PORK) LOCK FLUSH 100 UNIT/ML IV SOLN
250.0000 [IU] | Freq: Once | INTRAVENOUS | Status: DC | PRN
  Filled 2015-01-21: qty 5

## 2015-01-21 MED ORDER — SODIUM CHLORIDE 0.9 % IV SOLN
1000.0000 mL | Freq: Once | INTRAVENOUS | Status: AC
Start: 2015-01-21 — End: 2015-01-21
  Administered 2015-01-21: 1000 mL via INTRAVENOUS

## 2015-01-21 MED ORDER — SODIUM CHLORIDE 0.9 % IJ SOLN
10.0000 mL | INTRAMUSCULAR | Status: DC | PRN
  Administered 2015-01-21: 10 mL
  Filled 2015-01-21: qty 10

## 2015-01-21 NOTE — Progress Notes (Signed)
  Oncology Nurse Navigator Documentation   Navigator Encounter Type: Clinic/MDC (01/20/15 1030)    To provide support and encouragement, care continuity and to assess for needs, met with patient in Infusion while he was receiving chemo.  I reminded him to obtain Biafine during his WUT today. He did not express any needs or concerns at this time, I encouraged him to contact me if that changes, he verbalized understanding.     Gayleen Orem, RN, BSN, [facility] [place] [phone]

## 2015-01-21 NOTE — Progress Notes (Signed)
Pt here for IVF following radiation this morning. Pt denies any pain, nausea/vomiting. Pt reports Friday he experienced some mouth discomfort and burning. This has resolved with Biotene mouth spray, magic mouthwash and a mixture of baking soda and peroxide rinse.

## 2015-01-21 NOTE — Patient Instructions (Signed)
Dehydration, Adult Dehydration is when you lose more fluids from the body than you take in. Vital organs like the kidneys, brain, and heart cannot function without a proper amount of fluids and salt. Any loss of fluids from the body can cause dehydration.  CAUSES   Vomiting.  Diarrhea.  Excessive sweating.  Excessive urine output.  Fever. SYMPTOMS  Mild dehydration  Thirst.  Dry lips.  Slightly dry mouth. Moderate dehydration  Very dry mouth.  Sunken eyes.  Skin does not bounce back quickly when lightly pinched and released.  Dark urine and decreased urine production.  Decreased tear production.  Headache. Severe dehydration  Very dry mouth.  Extreme thirst.  Rapid, weak pulse (more than 100 beats per minute at rest).  Cold hands and feet.  Not able to sweat in spite of heat and temperature.  Rapid breathing.  Blue lips.  Confusion and lethargy.  Difficulty being awakened.  Minimal urine production.  No tears. DIAGNOSIS  Your caregiver will diagnose dehydration based on your symptoms and your exam. Blood and urine tests will help confirm the diagnosis. The diagnostic evaluation should also identify the cause of dehydration. TREATMENT  Treatment of mild or moderate dehydration can often be done at home by increasing the amount of fluids that you drink. It is best to drink small amounts of fluid more often. Drinking too much at one time can make vomiting worse. Refer to the home care instructions below. Severe dehydration needs to be treated at the hospital where you will probably be given intravenous (IV) fluids that contain water and electrolytes. HOME CARE INSTRUCTIONS   Ask your caregiver about specific rehydration instructions.  Drink enough fluids to keep your urine clear or pale yellow.  Drink small amounts frequently if you have nausea and vomiting.  Eat as you normally do.  Avoid:  Foods or drinks high in sugar.  Carbonated  drinks.  Juice.  Extremely hot or cold fluids.  Drinks with caffeine.  Fatty, greasy foods.  Alcohol.  Tobacco.  Overeating.  Gelatin desserts.  Wash your hands well to avoid spreading bacteria and viruses.  Only take over-the-counter or prescription medicines for pain, discomfort, or fever as directed by your caregiver.  Ask your caregiver if you should continue all prescribed and over-the-counter medicines.  Keep all follow-up appointments with your caregiver. SEEK MEDICAL CARE IF:  You have abdominal pain and it increases or stays in one area (localizes).  You have a rash, stiff neck, or severe headache.  You are irritable, sleepy, or difficult to awaken.  You are weak, dizzy, or extremely thirsty. SEEK IMMEDIATE MEDICAL CARE IF:   You are unable to keep fluids down or you get worse despite treatment.  You have frequent episodes of vomiting or diarrhea.  You have blood or green matter (bile) in your vomit.  You have blood in your stool or your stool looks black and tarry.  You have not urinated in 6 to 8 hours, or you have only urinated a small amount of very dark urine.  You have a fever.  You faint. MAKE SURE YOU:   Understand these instructions.  Will watch your condition.  Will get help right away if you are not doing well or get worse. Document Released: 06/28/2005 Document Revised: 09/20/2011 Document Reviewed: 02/15/2011 ExitCare Patient Information 2015 [employer]. This information is not intended to replace advice given to you by your health care provider. Make sure you discuss any questions you have with your health care   provider.  

## 2015-01-22 ENCOUNTER — Ambulatory Visit (HOSPITAL_BASED_OUTPATIENT_CLINIC_OR_DEPARTMENT_OTHER): Payer: Medicare Other

## 2015-01-22 ENCOUNTER — Ambulatory Visit
Admission: RE | Admit: 2015-01-22 | Discharge: 2015-01-22 | Disposition: A | Discharge status: Home / Self Care | Payer: Medicare Other | Source: Ambulatory Visit | Attending: Radiation Oncology | Admitting: Radiation Oncology

## 2015-01-22 VITALS — BP 156/78 | HR 71 | Temp 97.8°F | Resp 18

## 2015-01-22 DIAGNOSIS — E860 Dehydration: Secondary | ICD-10-CM | POA: Diagnosis not present

## 2015-01-22 DIAGNOSIS — C119 Malignant neoplasm of nasopharynx, unspecified: Secondary | ICD-10-CM

## 2015-01-22 MED ORDER — SODIUM CHLORIDE 0.9 % IV SOLN
Freq: Once | INTRAVENOUS | Status: AC
  Administered 2015-01-22: 11:00:00 via INTRAVENOUS

## 2015-01-22 MED ORDER — HEPARIN SOD (PORK) LOCK FLUSH 100 UNIT/ML IV SOLN
500.0000 [IU] | Freq: Once | INTRAVENOUS | Status: AC | PRN
  Administered 2015-01-22: 500 [IU]
  Filled 2015-01-22: qty 5

## 2015-01-22 MED ORDER — SODIUM CHLORIDE 0.9 % IJ SOLN
10.0000 mL | INTRAMUSCULAR | Status: DC | PRN
  Administered 2015-01-22: 10 mL
  Filled 2015-01-22: qty 10

## 2015-01-22 NOTE — Progress Notes (Signed)
Pt states that he is not having any nausea at this time and does not need his Zofran.

## 2015-01-22 NOTE — Patient Instructions (Signed)
Dehydration, Adult Dehydration is when you lose more fluids from the body than you take in. Vital organs like the kidneys, brain, and heart cannot function without a proper amount of fluids and salt. Any loss of fluids from the body can cause dehydration.  CAUSES   Vomiting.  Diarrhea.  Excessive sweating.  Excessive urine output.  Fever. SYMPTOMS  Mild dehydration  Thirst.  Dry lips.  Slightly dry mouth. Moderate dehydration  Very dry mouth.  Sunken eyes.  Skin does not bounce back quickly when lightly pinched and released.  Dark urine and decreased urine production.  Decreased tear production.  Headache. Severe dehydration  Very dry mouth.  Extreme thirst.  Rapid, weak pulse (more than 100 beats per minute at rest).  Cold hands and feet.  Not able to sweat in spite of heat and temperature.  Rapid breathing.  Blue lips.  Confusion and lethargy.  Difficulty being awakened.  Minimal urine production.  No tears. DIAGNOSIS  Your caregiver will diagnose dehydration based on your symptoms and your exam. Blood and urine tests will help confirm the diagnosis. The diagnostic evaluation should also identify the cause of dehydration. TREATMENT  Treatment of mild or moderate dehydration can often be done at home by increasing the amount of fluids that you drink. It is best to drink small amounts of fluid more often. Drinking too much at one time can make vomiting worse. Refer to the home care instructions below. Severe dehydration needs to be treated at the hospital where you will probably be given intravenous (IV) fluids that contain water and electrolytes. HOME CARE INSTRUCTIONS   Ask your caregiver about specific rehydration instructions.  Drink enough fluids to keep your urine clear or pale yellow.  Drink small amounts frequently if you have nausea and vomiting.  Eat as you normally do.  Avoid:  Foods or drinks high in sugar.  Carbonated  drinks.  Juice.  Extremely hot or cold fluids.  Drinks with caffeine.  Fatty, greasy foods.  Alcohol.  Tobacco.  Overeating.  Gelatin desserts.  Wash your hands well to avoid spreading bacteria and viruses.  Only take over-the-counter or prescription medicines for pain, discomfort, or fever as directed by your caregiver.  Ask your caregiver if you should continue all prescribed and over-the-counter medicines.  Keep all follow-up appointments with your caregiver. SEEK MEDICAL CARE IF:  You have abdominal pain and it increases or stays in one area (localizes).  You have a rash, stiff neck, or severe headache.  You are irritable, sleepy, or difficult to awaken.  You are weak, dizzy, or extremely thirsty. SEEK IMMEDIATE MEDICAL CARE IF:   You are unable to keep fluids down or you get worse despite treatment.  You have frequent episodes of vomiting or diarrhea.  You have blood or green matter (bile) in your vomit.  You have blood in your stool or your stool looks black and tarry.  You have not urinated in 6 to 8 hours, or you have only urinated a small amount of very dark urine.  You have a fever.  You faint. MAKE SURE YOU:   Understand these instructions.  Will watch your condition.  Will get help right away if you are not doing well or get worse. Document Released: 06/28/2005 Document Revised: 09/20/2011 Document Reviewed: 02/15/2011 ExitCare Patient Information 2015 [employer]. This information is not intended to replace advice given to you by your health care provider. Make sure you discuss any questions you have with your health care   provider.  

## 2015-01-23 ENCOUNTER — Ambulatory Visit (HOSPITAL_BASED_OUTPATIENT_CLINIC_OR_DEPARTMENT_OTHER): Payer: Medicare Other

## 2015-01-23 ENCOUNTER — Ambulatory Visit
Admission: RE | Admit: 2015-01-23 | Discharge: 2015-01-23 | Disposition: A | Discharge status: Home / Self Care | Payer: Medicare Other | Source: Ambulatory Visit | Attending: Radiation Oncology | Admitting: Radiation Oncology

## 2015-01-23 VITALS — BP 154/76 | HR 72 | Temp 97.9°F | Resp 16

## 2015-01-23 DIAGNOSIS — C119 Malignant neoplasm of nasopharynx, unspecified: Secondary | ICD-10-CM | POA: Diagnosis not present

## 2015-01-23 DIAGNOSIS — E860 Dehydration: Secondary | ICD-10-CM | POA: Diagnosis not present

## 2015-01-23 MED ORDER — SODIUM CHLORIDE 0.9 % IV SOLN: Freq: Once | INTRAVENOUS | Status: DC

## 2015-01-23 MED ORDER — SODIUM CHLORIDE 0.9 % IV SOLN
Freq: Once | INTRAVENOUS | Status: AC
  Administered 2015-01-23: 11:00:00 via INTRAVENOUS

## 2015-01-23 MED ORDER — HEPARIN SOD (PORK) LOCK FLUSH 100 UNIT/ML IV SOLN
500.0000 [IU] | Freq: Once | INTRAVENOUS | Status: AC | PRN
  Administered 2015-01-23: 500 [IU]
  Filled 2015-01-23: qty 5

## 2015-01-23 MED ORDER — SODIUM CHLORIDE 0.9 % IJ SOLN
10.0000 mL | INTRAMUSCULAR | Status: DC | PRN
  Administered 2015-01-23: 10 mL
  Filled 2015-01-23: qty 10

## 2015-01-23 NOTE — Patient Instructions (Signed)
Dehydration, Adult Dehydration is when you lose more fluids from the body than you take in. Vital organs like the kidneys, brain, and heart cannot function without a proper amount of fluids and salt. Any loss of fluids from the body can cause dehydration.  CAUSES   Vomiting.  Diarrhea.  Excessive sweating.  Excessive urine output.  Fever. SYMPTOMS  Mild dehydration  Thirst.  Dry lips.  Slightly dry mouth. Moderate dehydration  Very dry mouth.  Sunken eyes.  Skin does not bounce back quickly when lightly pinched and released.  Dark urine and decreased urine production.  Decreased tear production.  Headache. Severe dehydration  Very dry mouth.  Extreme thirst.  Rapid, weak pulse (more than 100 beats per minute at rest).  Cold hands and feet.  Not able to sweat in spite of heat and temperature.  Rapid breathing.  Blue lips.  Confusion and lethargy.  Difficulty being awakened.  Minimal urine production.  No tears. DIAGNOSIS  Your caregiver will diagnose dehydration based on your symptoms and your exam. Blood and urine tests will help confirm the diagnosis. The diagnostic evaluation should also identify the cause of dehydration. TREATMENT  Treatment of mild or moderate dehydration can often be done at home by increasing the amount of fluids that you drink. It is best to drink small amounts of fluid more often. Drinking too much at one time can make vomiting worse. Refer to the home care instructions below. Severe dehydration needs to be treated at the hospital where you will probably be given intravenous (IV) fluids that contain water and electrolytes. HOME CARE INSTRUCTIONS   Ask your caregiver about specific rehydration instructions.  Drink enough fluids to keep your urine clear or pale yellow.  Drink small amounts frequently if you have nausea and vomiting.  Eat as you normally do.  Avoid:  Foods or drinks high in sugar.  Carbonated  drinks.  Juice.  Extremely hot or cold fluids.  Drinks with caffeine.  Fatty, greasy foods.  Alcohol.  Tobacco.  Overeating.  Gelatin desserts.  Wash your hands well to avoid spreading bacteria and viruses.  Only take over-the-counter or prescription medicines for pain, discomfort, or fever as directed by your caregiver.  Ask your caregiver if you should continue all prescribed and over-the-counter medicines.  Keep all follow-up appointments with your caregiver. SEEK MEDICAL CARE IF:  You have abdominal pain and it increases or stays in one area (localizes).  You have a rash, stiff neck, or severe headache.  You are irritable, sleepy, or difficult to awaken.  You are weak, dizzy, or extremely thirsty. SEEK IMMEDIATE MEDICAL CARE IF:   You are unable to keep fluids down or you get worse despite treatment.  You have frequent episodes of vomiting or diarrhea.  You have blood or green matter (bile) in your vomit.  You have blood in your stool or your stool looks black and tarry.  You have not urinated in 6 to 8 hours, or you have only urinated a small amount of very dark urine.  You have a fever.  You faint. MAKE SURE YOU:   Understand these instructions.  Will watch your condition.  Will get help right away if you are not doing well or get worse. Document Released: 06/28/2005 Document Revised: 09/20/2011 Document Reviewed: 02/15/2011 ExitCare Patient Information 2015 [employer]. This information is not intended to replace advice given to you by your health care provider. Make sure you discuss any questions you have with your health care   provider.  

## 2015-01-24 ENCOUNTER — Ambulatory Visit (HOSPITAL_BASED_OUTPATIENT_CLINIC_OR_DEPARTMENT_OTHER): Payer: Medicare Other | Admitting: Hematology and Oncology

## 2015-01-24 ENCOUNTER — Encounter: Payer: Self-pay | Admitting: Hematology and Oncology

## 2015-01-24 ENCOUNTER — Ambulatory Visit: Payer: Self-pay

## 2015-01-24 ENCOUNTER — Other Ambulatory Visit (HOSPITAL_BASED_OUTPATIENT_CLINIC_OR_DEPARTMENT_OTHER): Payer: Medicare Other

## 2015-01-24 ENCOUNTER — Ambulatory Visit
Admission: RE | Admit: 2015-01-24 | Discharge: 2015-01-24 | Disposition: A | Discharge status: Home / Self Care | Payer: Medicare Other | Source: Ambulatory Visit | Attending: Radiation Oncology | Admitting: Radiation Oncology

## 2015-01-24 ENCOUNTER — Telehealth: Payer: Self-pay | Admitting: Hematology and Oncology

## 2015-01-24 VITALS — BP 142/73 | HR 75 | Temp 98.8°F | Resp 18 | Ht 68.0 in | Wt 167.4 lb

## 2015-01-24 DIAGNOSIS — K121 Other forms of stomatitis: Secondary | ICD-10-CM

## 2015-01-24 DIAGNOSIS — E875 Hyperkalemia: Secondary | ICD-10-CM

## 2015-01-24 DIAGNOSIS — Z931 Gastrostomy status: Secondary | ICD-10-CM

## 2015-01-24 DIAGNOSIS — E1122 Type 2 diabetes mellitus with diabetic chronic kidney disease: Secondary | ICD-10-CM

## 2015-01-24 DIAGNOSIS — T50905A Adverse effect of unspecified drugs, medicaments and biological substances, initial encounter: Secondary | ICD-10-CM

## 2015-01-24 DIAGNOSIS — E46 Unspecified protein-calorie malnutrition: Secondary | ICD-10-CM | POA: Insufficient documentation

## 2015-01-24 DIAGNOSIS — D6959 Other secondary thrombocytopenia: Secondary | ICD-10-CM | POA: Diagnosis not present

## 2015-01-24 DIAGNOSIS — C119 Malignant neoplasm of nasopharynx, unspecified: Secondary | ICD-10-CM | POA: Diagnosis not present

## 2015-01-24 DIAGNOSIS — N182 Chronic kidney disease, stage 2 (mild): Secondary | ICD-10-CM

## 2015-01-24 DIAGNOSIS — D6481 Anemia due to antineoplastic chemotherapy: Secondary | ICD-10-CM

## 2015-01-24 DIAGNOSIS — R609 Edema, unspecified: Secondary | ICD-10-CM

## 2015-01-24 DIAGNOSIS — T451X5A Adverse effect of antineoplastic and immunosuppressive drugs, initial encounter: Secondary | ICD-10-CM

## 2015-01-24 DIAGNOSIS — K1231 Oral mucositis (ulcerative) due to antineoplastic therapy: Secondary | ICD-10-CM | POA: Insufficient documentation

## 2015-01-24 DIAGNOSIS — R600 Localized edema: Secondary | ICD-10-CM

## 2015-01-24 LAB — CBC WITH DIFFERENTIAL/PLATELET
BASO%: 0.1 % (ref 0.0–2.0)
BASOS ABS: 0 10*3/uL (ref 0.0–0.1)
EOS%: 0.1 % (ref 0.0–7.0)
Eosinophils Absolute: 0 10*3/uL (ref 0.0–0.5)
HCT: 37.8 % — ABNORMAL LOW (ref 38.4–49.9)
HEMOGLOBIN: 12.6 g/dL — AB (ref 13.0–17.1)
LYMPH%: 3.3 % — ABNORMAL LOW (ref 14.0–49.0)
MCH: 28.4 pg (ref 27.2–33.4)
MCHC: 33.4 g/dL (ref 32.0–36.0)
MCV: 84.9 fL (ref 79.3–98.0)
MONO#: 0.3 10*3/uL (ref 0.1–0.9)
MONO%: 7.2 % (ref 0.0–14.0)
NEUT%: 89.3 % — ABNORMAL HIGH (ref 39.0–75.0)
NEUTROS ABS: 3.4 10*3/uL (ref 1.5–6.5)
PLATELETS: 113 10*3/uL — AB (ref 140–400)
RBC: 4.45 10*6/uL (ref 4.20–5.82)
RDW: 14.2 % (ref 11.0–14.6)
WBC: 3.8 10*3/uL — AB (ref 4.0–10.3)
lymph#: 0.1 10*3/uL — ABNORMAL LOW (ref 0.9–3.3)

## 2015-01-24 LAB — COMPREHENSIVE METABOLIC PANEL (CC13)
ALBUMIN: 2 g/dL — AB (ref 3.5–5.0)
ALT: 32 U/L (ref 0–55)
AST: 17 U/L (ref 5–34)
Alkaline Phosphatase: 60 U/L (ref 40–150)
Anion Gap: 9 mEq/L (ref 3–11)
BUN: 39.7 mg/dL — ABNORMAL HIGH (ref 7.0–26.0)
CO2: 19 mEq/L — ABNORMAL LOW (ref 22–29)
CREATININE: 1.4 mg/dL — AB (ref 0.7–1.3)
Calcium: 7.7 mg/dL — ABNORMAL LOW (ref 8.4–10.4)
Chloride: 104 mEq/L (ref 98–109)
EGFR: 53 mL/min/{1.73_m2} — ABNORMAL LOW (ref >90)
Glucose: 317 mg/dl — ABNORMAL HIGH (ref 70–140)
Potassium: 5 mEq/L (ref 3.5–5.1)
Sodium: 132 mEq/L — ABNORMAL LOW (ref 136–145)
Total Bilirubin: 0.37 mg/dL (ref 0.20–1.20)
Total Protein: 4.9 g/dL — ABNORMAL LOW (ref 6.4–8.3)

## 2015-01-24 LAB — MAGNESIUM (CC13): Magnesium: 1.5 mg/dl (ref 1.5–2.5)

## 2015-01-24 NOTE — Assessment & Plan Note (Signed)
This is related to low albumin state. I recommended increase physical activity as tolerated. He does not need diuretic therapy I also discussed with him that the leg swelling is not really related to the extra IV fluids. However, if the patient feels that he is able to have adequate oral or fluid intake through the PEG tube, we can discontinue IV fluids

## 2015-01-24 NOTE — Assessment & Plan Note (Signed)
He has reduced creatinine clearance which is chronic I am willing to proceed with chemotherapy, dose adjustment by reducing chemotherapy by 25% weekly and aggressive fluid hydration. He can proceed with chemotherapy as long as creatinine remains under 2

## 2015-01-24 NOTE — Assessment & Plan Note (Signed)
The patient had progressive weight loss. He has not started using his feeding tube for nutritional needs. I continue to encourage him to increase his oral intake if tolerated. If not, I reinforced the importance of using his feeding tube.

## 2015-01-24 NOTE — Assessment & Plan Note (Signed)
Overall, he has started to experience more side effects with treatment. He has progressive weight loss, mucositis, leg swelling and increasing frustration. He is questioning a lot of the plan of care. I reviewed his PET CT scan and MRI again with the patient. I reassured him, on clinical examination, the lymph nodes shrinking. We discussed the risk, benefit, side effects of continuing IV fluids support and weekly cisplatin. Ultimately, after a very prolonged discussion, he is in agreement to proceed with further chemotherapy next week. He is not sure whether he once to continue IV fluids because of the leg swelling. I will proceed to schedule dose IV fluid for him with the option that he can call and cancel them if he does not feel that is necessary, although it is highly recommended.

## 2015-01-24 NOTE — Assessment & Plan Note (Signed)
This is likely due to recent treatment. The patient denies recent history of bleeding such as epistaxis, hematuria or hematochezia. He is asymptomatic from the low platelet count. I will observe for now.  he does not require transfusion now. I will continue the chemotherapy at current dose without dosage adjustment.  If the thrombocytopenia gets progressive worse in the future, I might have to delay his treatment or adjust the chemotherapy dose.   

## 2015-01-24 NOTE — Assessment & Plan Note (Signed)
The potassium level has improved since we discontinue potassium additives in the IV fluids and the patient changed his diet.

## 2015-01-24 NOTE — Telephone Encounter (Signed)
lvm fo rpt regarding to added appt nxt week.Adam KitchenMarland Yang

## 2015-01-24 NOTE — Progress Notes (Signed)
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   12/23/2014 -  Chemotherapy  he received weekly cisplatin   12/23/2014 -  Radiation Therapy  he received radiation treatment    INTERVAL HISTORY: Please see below for problem oriented charting. He is seen today prior to cycle 6 of treatment. The patient is getting increasing frustrated with more side effects. He has progressive leg swelling and felt that the IV fluids is to be blamed. He has significant altered taste sensation with associated mucositis and that makes him not wanting to eat/drink. He has lost some weight. He denies nausea or vomiting.  REVIEW OF SYSTEMS:   Constitutional: Denies fevers, chills or abnormal weight loss Eyes: Denies blurriness of vision Respiratory: Denies cough, dyspnea or wheezes Cardiovascular: Denies palpitation, chest discomfort  Gastrointestinal:  Denies nausea, heartburn or change in bowel habits Skin: Denies abnormal skin rashes Lymphatics: Denies new lymphadenopathy or easy bruising Neurological:Denies numbness, tingling or new weaknesses Behavioral/Psych: Mood is stable, no new changes  All other systems were reviewed with the patient and are negative.  I have reviewed the past medical  history, past surgical history, social history and family history with the patient and they are unchanged from previous note.  ALLERGIES:  has No Known Allergies.  MEDICATIONS:  Current Outpatient Prescriptions  Medication Sig Dispense Refill  . acetaminophen (TYLENOL) 500 MG tablet Take 500-1,000 mg by mouth every 6 (six) hours as needed for mild pain.    Marland Kitchen Alum & Mag Hydroxide-Simeth (MAGIC MOUTHWASH W/LIDOCAINE) SOLN Take 10 mLs by mouth 4 (four) times daily as needed for mouth pain. 500 mL 1  . amLODipine (NORVASC) 5 MG tablet Take 5 mg by mouth every morning.     Marland Kitchen dexamethasone (DECADRON) 4 MG tablet Take 1 tablet (4 mg total) by mouth 2 (two) times daily with a meal. 60 tablet 0  . diphenhydrAMINE (BENADRYL) 25 MG tablet Take 25 mg by mouth every 6 (six) hours as needed.    Marland Kitchen emollient (BIAFINE) cream Apply 1 application topically daily.    Marland Kitchen glimepiride (AMARYL) 2 MG tablet Take 2 mg by mouth 2 (two) times daily.   4  . lidocaine-prilocaine (EMLA) cream Apply to affected area once 30 g 3  . LORazepam (ATIVAN) 1 MG tablet Take 1 tablet (1 mg total) by mouth 2 (two) times daily as needed for anxiety. 40 tablet 0  . Multiple Vitamins-Minerals (CENTRUM SILVER PO) Take by mouth daily.    . Omega-3 Fatty Acids (FISH OIL PO) Take by mouth daily.    . ondansetron (ZOFRAN) 8 MG tablet Take 1 tablet (8 mg total) by mouth every 8 (eight) hours as needed. 60 tablet 1  . oxyCODONE-acetaminophen (PERCOCET) 5-325 MG per tablet Take one or two tablets by mouth every 4-6 hours as needed for pain. 50 tablet 0  . prochlorperazine (COMPAZINE)  10 MG tablet Take 1 tablet (10 mg total) by mouth every 6 (six) hours as needed (Nausea or vomiting). 60 tablet 1   No current facility-administered medications for this visit.    PHYSICAL EXAMINATION: ECOG PERFORMANCE STATUS: 1 - Symptomatic but completely ambulatory  Filed Vitals:   01/24/15 0821  BP: 142/73  Pulse: 75  Temp: 98.8 F (37.1 C)  Resp: 18    Filed Weights   01/24/15 0821  Weight: 167 lb 6.4 oz (75.932 kg)    GENERAL:alert, no distress and comfortable SKIN: skin color, texture, turgor are normal, no rashes or significant lesions EYES: normal, Conjunctiva are pink and non-injected, sclera clear OROPHARYNX: Mucositis with ulceration. No thrush. NECK: supple, thyroid normal size, non-tender, without nodularity LYMPH:  Previously palpable lymphadenopathy has improved.  LUNGS: clear to auscultation and percussion with normal breathing effort HEART: regular rate & rhythm and no murmurs with moderate bilateral lower extremity edema ABDOMEN:abdomen soft, non-tender and normal bowel sounds Musculoskeletal:no cyanosis of digits and no clubbing  NEURO: alert & oriented x 3 with fluent speech, no focal motor/sensory deficits  LABORATORY DATA:  I have reviewed the data as listed    Component Value Date/Time   NA 132* 01/24/2015 0813   K 5.0 01/24/2015 0813   CO2 19* 01/24/2015 0813   GLUCOSE 317* 01/24/2015 0813   BUN 39.7* 01/24/2015 0813   CREATININE 1.4* 01/24/2015 0813   CALCIUM 7.7* 01/24/2015 0813   PROT 4.9* 01/24/2015 0813   ALBUMIN 2.0* 01/24/2015 0813   AST 17 01/24/2015 0813   ALT 32 01/24/2015 0813   ALKPHOS 60 01/24/2015 0813   BILITOT 0.37 01/24/2015 0813    No results found for: SPEP, UPEP  Lab Results  Component Value Date   WBC 3.8* 01/24/2015   NEUTROABS 3.4 01/24/2015   HGB 12.6* 01/24/2015   HCT 37.8* 01/24/2015   MCV 84.9 01/24/2015   PLT 113* 01/24/2015      Chemistry      Component Value Date/Time   NA 132* 01/24/2015 0813   K 5.0 01/24/2015 0813   CO2 19* 01/24/2015 0813   BUN 39.7* 01/24/2015 0813   CREATININE 1.4* 01/24/2015 0813      Component Value Date/Time   CALCIUM 7.7* 01/24/2015 0813   ALKPHOS 60 01/24/2015 0813   AST 17 01/24/2015 0813   ALT 32 01/24/2015 0813   BILITOT 0.37 01/24/2015 0813    I reviewed the PET CT scan and MRI with him  ASSESSMENT & PLAN:   Nasopharyngeal cancer Overall, he has started to experience more side effects with treatment. He has progressive weight loss, mucositis, leg swelling and increasing frustration. He is questioning a lot of the plan of care. I reviewed his PET CT scan and MRI again with the patient. I reassured him, on clinical examination, the lymph nodes shrinking. We discussed the risk, benefit, side effects of continuing IV fluids support and weekly cisplatin. Ultimately, after a very prolonged discussion, he is in agreement to proceed with further chemotherapy next week. He is not sure whether he once to continue IV fluids because of the leg swelling. I will proceed to schedule dose IV fluid for him with the option that he can call and cancel them if he does not feel that is necessary, although it is highly recommended.  Hyperkalemia The potassium level has improved since we discontinue potassium additives in the IV fluids and the patient changed his diet.  S/P gastrostomy Feeding tube site looks okay.  He will  continue flushes. He is still able to tolerate 100% of food by mouth.    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 He can proceed with chemotherapy as long as creatinine remains under 2    Anemia due to antineoplastic chemotherapy This is likely due to recent treatment. The patient denies recent history of bleeding such as epistaxis, hematuria or hematochezia. He is asymptomatic from the anemia. I will observe for now.  He does not require transfusion now. I will continue the chemotherapy at current dose without dosage adjustment.  If the anemia gets progressive worse in the future, I might have to delay his treatment or adjust the chemotherapy dose.   Bilateral lower extremity  edema This is related to low albumin state. I recommended increase physical activity as tolerated. He does not need diuretic therapy I also discussed with him that the leg swelling is not really related to the extra IV fluids. However, if the patient feels that he is able to have adequate oral or fluid intake through the PEG tube, we can discontinue IV fluids    Thrombocytopenia due to drugs This is likely due to recent treatment. The patient denies recent history of bleeding such as epistaxis, hematuria or hematochezia. He is asymptomatic from the low platelet count. I will observe for now.  he does not require transfusion now. I will continue the chemotherapy at current dose without dosage adjustment.  If the thrombocytopenia gets progressive worse in the future, I might have to delay his treatment or adjust the chemotherapy dose.    Protein calorie malnutrition The patient had progressive weight loss. He has not started using his feeding tube for nutritional needs. I continue to encourage him to increase his oral intake if tolerated. If not, I reinforced the importance of using his feeding tube.  Mucositis due to chemotherapy He has started to experience more mucositis but denies pain. He will continue Magic mouthwash but he does have pain medicine to take if needed.   No orders of the defined types were placed in this encounter.   All questions were answered. The patient knows to call the clinic with any problems, questions or concerns. No barriers to learning was detected. I spent 40 minutes counseling the patient face to face. The total time spent in the appointment was 60 minutes and more than 50% was on counseling and review of test results     [facility], [place], MD 01/24/2015 4:31 PM

## 2015-01-24 NOTE — Assessment & Plan Note (Signed)
He has started to experience more mucositis but denies pain. He will continue Magic mouthwash but he does have pain medicine to take if needed.

## 2015-01-24 NOTE — Assessment & Plan Note (Signed)
Feeding tube site looks okay.  He will continue flushes. He is still able to tolerate 100% of food by mouth.

## 2015-01-24 NOTE — Assessment & Plan Note (Signed)
This is likely due to recent treatment. The patient denies recent history of bleeding such as epistaxis, hematuria or hematochezia. He is asymptomatic from the anemia. I will observe for now.  He does not require transfusion now. I will continue the chemotherapy at current dose without dosage adjustment.  If the anemia gets progressive worse in the future, I might have to delay his treatment or adjust the chemotherapy dose.  

## 2015-01-27 ENCOUNTER — Ambulatory Visit (HOSPITAL_BASED_OUTPATIENT_CLINIC_OR_DEPARTMENT_OTHER): Payer: Medicare Other

## 2015-01-27 ENCOUNTER — Ambulatory Visit
Admission: RE | Admit: 2015-01-27 | Discharge: 2015-01-27 | Disposition: A | Discharge status: Home / Self Care | Payer: Medicare Other | Source: Ambulatory Visit | Attending: Radiation Oncology | Admitting: Radiation Oncology

## 2015-01-27 ENCOUNTER — Encounter: Payer: Self-pay | Admitting: Nutrition

## 2015-01-27 VITALS — BP 155/79 | HR 74 | Temp 97.8°F | Resp 12 | Wt 165.0 lb

## 2015-01-27 VITALS — BP 149/78 | HR 80 | Temp 97.5°F | Resp 18

## 2015-01-27 DIAGNOSIS — C119 Malignant neoplasm of nasopharynx, unspecified: Secondary | ICD-10-CM

## 2015-01-27 DIAGNOSIS — K1233 Oral mucositis (ulcerative) due to radiation: Secondary | ICD-10-CM

## 2015-01-27 DIAGNOSIS — Z5111 Encounter for antineoplastic chemotherapy: Secondary | ICD-10-CM

## 2015-01-27 MED ORDER — SODIUM CHLORIDE 0.9 % IV SOLN
INTRAVENOUS | Status: DC
  Administered 2015-01-27: 09:00:00 via INTRAVENOUS

## 2015-01-27 MED ORDER — PALONOSETRON HCL INJECTION 0.25 MG/5ML
0.2500 mg | Freq: Once | INTRAVENOUS | Status: AC
  Administered 2015-01-27: 0.25 mg via INTRAVENOUS

## 2015-01-27 MED ORDER — FOSAPREPITANT DIMEGLUMINE INJECTION 150 MG
Freq: Once | INTRAVENOUS | Status: AC
  Administered 2015-01-27: 11:00:00 via INTRAVENOUS
  Filled 2015-01-27: qty 5

## 2015-01-27 MED ORDER — FENTANYL 25 MCG/HR TD PT72: 25.0000 ug | MEDICATED_PATCH | TRANSDERMAL | Status: DC

## 2015-01-27 MED ORDER — HEPARIN SOD (PORK) LOCK FLUSH 100 UNIT/ML IV SOLN
500.0000 [IU] | Freq: Once | INTRAVENOUS | Status: DC | PRN
  Filled 2015-01-27: qty 5

## 2015-01-27 MED ORDER — PALONOSETRON HCL INJECTION 0.25 MG/5ML
INTRAVENOUS | Status: AC
  Filled 2015-01-27: qty 5

## 2015-01-27 MED ORDER — SODIUM CHLORIDE 0.9 % IJ SOLN
10.0000 mL | INTRAMUSCULAR | Status: DC | PRN
  Filled 2015-01-27: qty 10

## 2015-01-27 MED ORDER — SODIUM CHLORIDE 0.9 % IV SOLN
30.0000 mg/m2 | Freq: Once | INTRAVENOUS | Status: AC
  Administered 2015-01-27: 59 mg via INTRAVENOUS
  Filled 2015-01-27: qty 59

## 2015-01-27 MED ORDER — MAGNESIUM SULFATE 50 % IJ SOLN
Freq: Once | INTRAVENOUS | Status: AC
  Administered 2015-01-27: 09:00:00 via INTRAVENOUS
  Filled 2015-01-27: qty 1000

## 2015-01-27 NOTE — Patient Instructions (Signed)
[facility] Discharge Instructions for Patients Receiving Chemotherapy  Today you received the following chemotherapy agents: Cisplatin   To help prevent nausea and vomiting after your treatment, we encourage you to take your nausea medication as directed.    If you develop nausea and vomiting that is not controlled by your nausea medication, call the clinic.   BELOW ARE SYMPTOMS THAT SHOULD BE REPORTED IMMEDIATELY:  *FEVER GREATER THAN 100.5 F  *CHILLS WITH OR WITHOUT FEVER  NAUSEA AND VOMITING THAT IS NOT CONTROLLED WITH YOUR NAUSEA MEDICATION  *UNUSUAL SHORTNESS OF BREATH  *UNUSUAL BRUISING OR BLEEDING  TENDERNESS IN MOUTH AND THROAT WITH OR WITHOUT PRESENCE OF ULCERS  *URINARY PROBLEMS  *BOWEL PROBLEMS  UNUSUAL RASH Items with * indicate a potential emergency and should be followed up as soon as possible.  Feel free to call the clinic you have any questions or concerns. The clinic phone number is [phone].  Please show the CHEMO ALERT CARD at check-in to the Emergency Department and triage nurse.   

## 2015-01-27 NOTE — Progress Notes (Signed)
Weekly Management Note:  Site: Nasopharynx/neck Current Dose:  5000  cGy Projected Dose: 7000  cGy  Narrative: The patient is seen today for routine under treatment assessment. CBCT/MVCT images/port films were reviewed. The chart was reviewed.   He is now having symptomatic mucositis which she rates as 5/10.  He has been taking 3-4 Percocet a day without much relief.  He is now using his PEG tube since last week.  His weight is down 5 pounds over the past week.  Physical Examination:  Filed Vitals:   01/27/15 1606  BP: 155/79  Pulse: 74  Temp:   Resp:   .  Weight: 165 lb (74.844 kg).  There has been further slight regression of his left upper neck adenopathy but his adenopathy remains prominent.  I suspect that he has necrotic adenopathy.  On inspection oral cavity there is a confluent mucositis along the mid to posterior oral cavity and oropharynx.  No candidiasis.  Laboratory data: Lab Results  Component Value Date   WBC 3.8* 01/24/2015   HGB 12.6* 01/24/2015   HCT 37.8* 01/24/2015   MCV 84.9 01/24/2015   PLT 113* 01/24/2015     Impression: Tolerating radiation therapy well, although he does have symptomatically be mucositis as expected.  He needs a long-acting pain preparation and he prefers to try a fentanyl patch, I will start him on 25 g patches dispensed #5 to use 1 every 3 days.  He can use Percocet when necessary breakthrough pain.  I encouraged him to maintain his caloric intake through his PEG tube.  Plan: Continue radiation therapy as planned.

## 2015-01-27 NOTE — Progress Notes (Signed)
1102 pt voided 500cc urine chemo ordered

## 2015-01-27 NOTE — Progress Notes (Signed)
PAIN: He rates his pain as a 5 on a scale of 0-10. constant, sharp and burning over roof of mouth  SWALLOWING/DIET: Pt has had dysphagia for both solids and liquids. Pt reports a only receiving feedings via GT-formula-Glucernia, 8 cans per day,  1400 ml H20 flush. Oral exam reveals mucous membranes moist and pink and white peel to palate tongue and inside cheeks.  Reports dry mouth.  Hoarse voice.  BOWEL: Pt reports Constipation, bowel movement every two days. SKIN: Skin exam reveals Dryness and erythema. Dime size scab to right chin due to shaving.  Erythema, dry yellow crusting to port site due to tegaderm reaction.  Pt continues to apply Biafine as directed. OTHER: Pt complains of fatigue and weakness.   WEIGHT/VS: Wt Readings from Last 3 Encounters:  01/27/15 165 lb (74.844 kg)  01/24/15 167 lb 6.4 oz (75.932 kg)  01/20/15 170 lb 6.4 oz (77.293 kg)   BP 160/80 mmHg  Pulse 69  Temp(Src) 97.8 F (36.6 C) (Oral)  Resp 12  Wt 165 lb (74.844 kg)  SpO2 99% Orthostatic Standing Vital Signs: BP:155/79 P: 74 Pox:100

## 2015-01-28 ENCOUNTER — Ambulatory Visit
Admission: RE | Admit: 2015-01-28 | Discharge: 2015-01-28 | Disposition: A | Discharge status: Home / Self Care | Payer: Medicare Other | Source: Ambulatory Visit | Attending: Radiation Oncology | Admitting: Radiation Oncology

## 2015-01-28 ENCOUNTER — Ambulatory Visit (HOSPITAL_BASED_OUTPATIENT_CLINIC_OR_DEPARTMENT_OTHER): Payer: Medicare Other

## 2015-01-28 VITALS — BP 153/80 | HR 73 | Temp 97.8°F | Resp 18

## 2015-01-28 DIAGNOSIS — E860 Dehydration: Secondary | ICD-10-CM | POA: Diagnosis not present

## 2015-01-28 DIAGNOSIS — C119 Malignant neoplasm of nasopharynx, unspecified: Secondary | ICD-10-CM | POA: Diagnosis not present

## 2015-01-28 MED ORDER — HEPARIN SOD (PORK) LOCK FLUSH 100 UNIT/ML IV SOLN
500.0000 [IU] | Freq: Once | INTRAVENOUS | Status: AC | PRN
  Administered 2015-01-28: 500 [IU]
  Filled 2015-01-28: qty 5

## 2015-01-28 MED ORDER — SODIUM CHLORIDE 0.9 % IJ SOLN
10.0000 mL | INTRAMUSCULAR | Status: DC | PRN
  Administered 2015-01-28: 10 mL
  Filled 2015-01-28: qty 10

## 2015-01-28 MED ORDER — SODIUM CHLORIDE 0.9 % IV SOLN
Freq: Once | INTRAVENOUS | Status: AC
  Administered 2015-01-28: 09:00:00 via INTRAVENOUS

## 2015-01-28 NOTE — Patient Instructions (Signed)
Dehydration, Adult Dehydration is when you lose more fluids from the body than you take in. Vital organs like the kidneys, brain, and heart cannot function without a proper amount of fluids and salt. Any loss of fluids from the body can cause dehydration.  CAUSES   Vomiting.  Diarrhea.  Excessive sweating.  Excessive urine output.  Fever. SYMPTOMS  Mild dehydration  Thirst.  Dry lips.  Slightly dry mouth. Moderate dehydration  Very dry mouth.  Sunken eyes.  Skin does not bounce back quickly when lightly pinched and released.  Dark urine and decreased urine production.  Decreased tear production.  Headache. Severe dehydration  Very dry mouth.  Extreme thirst.  Rapid, weak pulse (more than 100 beats per minute at rest).  Cold hands and feet.  Not able to sweat in spite of heat and temperature.  Rapid breathing.  Blue lips.  Confusion and lethargy.  Difficulty being awakened.  Minimal urine production.  No tears. DIAGNOSIS  Your caregiver will diagnose dehydration based on your symptoms and your exam. Blood and urine tests will help confirm the diagnosis. The diagnostic evaluation should also identify the cause of dehydration. TREATMENT  Treatment of mild or moderate dehydration can often be done at home by increasing the amount of fluids that you drink. It is best to drink small amounts of fluid more often. Drinking too much at one time can make vomiting worse. Refer to the home care instructions below. Severe dehydration needs to be treated at the hospital where you will probably be given intravenous (IV) fluids that contain water and electrolytes. HOME CARE INSTRUCTIONS   Ask your caregiver about specific rehydration instructions.  Drink enough fluids to keep your urine clear or pale yellow.  Drink small amounts frequently if you have nausea and vomiting.  Eat as you normally do.  Avoid:  Foods or drinks high in sugar.  Carbonated  drinks.  Juice.  Extremely hot or cold fluids.  Drinks with caffeine.  Fatty, greasy foods.  Alcohol.  Tobacco.  Overeating.  Gelatin desserts.  Wash your hands well to avoid spreading bacteria and viruses.  Only take over-the-counter or prescription medicines for pain, discomfort, or fever as directed by your caregiver.  Ask your caregiver if you should continue all prescribed and over-the-counter medicines.  Keep all follow-up appointments with your caregiver. SEEK MEDICAL CARE IF:  You have abdominal pain and it increases or stays in one area (localizes).  You have a rash, stiff neck, or severe headache.  You are irritable, sleepy, or difficult to awaken.  You are weak, dizzy, or extremely thirsty. SEEK IMMEDIATE MEDICAL CARE IF:   You are unable to keep fluids down or you get worse despite treatment.  You have frequent episodes of vomiting or diarrhea.  You have blood or green matter (bile) in your vomit.  You have blood in your stool or your stool looks black and tarry.  You have not urinated in 6 to 8 hours, or you have only urinated a small amount of very dark urine.  You have a fever.  You faint. MAKE SURE YOU:   Understand these instructions.  Will watch your condition.  Will get help right away if you are not doing well or get worse. Document Released: 06/28/2005 Document Revised: 09/20/2011 Document Reviewed: 02/15/2011 ExitCare Patient Information 2015 [employer]. This information is not intended to replace advice given to you by your health care provider. Make sure you discuss any questions you have with your health care   provider.  

## 2015-01-28 NOTE — Progress Notes (Signed)
Patient provided with 8 oz Glucerna and 240 cc of water per feeding tube.

## 2015-01-29 ENCOUNTER — Ambulatory Visit
Admission: RE | Admit: 2015-01-29 | Discharge: 2015-01-29 | Disposition: A | Discharge status: Home / Self Care | Payer: Medicare Other | Source: Ambulatory Visit | Attending: Radiation Oncology | Admitting: Radiation Oncology

## 2015-01-29 ENCOUNTER — Ambulatory Visit (HOSPITAL_BASED_OUTPATIENT_CLINIC_OR_DEPARTMENT_OTHER): Payer: Medicare Other

## 2015-01-29 VITALS — BP 167/80 | HR 81 | Temp 98.2°F | Resp 16

## 2015-01-29 VITALS — BP 167/86 | HR 91 | Temp 98.3°F | Resp 12 | Wt 168.2 lb

## 2015-01-29 DIAGNOSIS — E860 Dehydration: Secondary | ICD-10-CM | POA: Diagnosis not present

## 2015-01-29 DIAGNOSIS — C119 Malignant neoplasm of nasopharynx, unspecified: Secondary | ICD-10-CM | POA: Diagnosis not present

## 2015-01-29 MED ORDER — SODIUM CHLORIDE 0.9 % IV SOLN
1000.0000 mL | Freq: Once | INTRAVENOUS | Status: AC
  Administered 2015-01-29: 1000 mL via INTRAVENOUS

## 2015-01-29 MED ORDER — HEPARIN SOD (PORK) LOCK FLUSH 100 UNIT/ML IV SOLN
500.0000 [IU] | Freq: Once | INTRAVENOUS | Status: AC
  Administered 2015-01-29: 500 [IU] via INTRAVENOUS
  Filled 2015-01-29: qty 5

## 2015-01-29 MED ORDER — SODIUM CHLORIDE 0.9 % IJ SOLN
10.0000 mL | INTRAMUSCULAR | Status: DC | PRN
  Administered 2015-01-29: 10 mL via INTRAVENOUS
  Filled 2015-01-29: qty 10

## 2015-01-29 MED ORDER — SODIUM CHLORIDE 0.9 % IV SOLN
Freq: Once | INTRAVENOUS | Status: DC
  Filled 2015-01-29: qty 4

## 2015-01-29 NOTE — Progress Notes (Signed)
Weekly Management Note:  Site: Nasopharynx/bilateral neck Current Dose:  5400  cGy Projected Dose: 7000  cGy  Narrative: The patient is seen today for routine under treatment assessment. CBCT/MVCT images/port films were reviewed. The chart was reviewed.   He is seen today is feeling somewhat disoriented since this morning.  We started a 25 g fentanyl patch on him 2 days ago.  His mouth and throat discomfort are much improved.  He has not taking any more Percocet.  His wife tells me that he usually becomes quite anxious before he comes in for his daily treatment.  He took 1 mg of Ativan this morning which was of little benefit.  He is still somewhat anxious.  Physical Examination:  Filed Vitals:   01/29/15 1103  BP: 167/86  Pulse: 91  Temp: 98.3 F (36.8 C)  Resp: 12  .  Weight: 168 lb 3.2 oz (76.295 kg).  He is alert and oriented, but slightly anxious.  His examination is unchanged.  Impression: Tolerating radiation therapy well although he may be somewhat disoriented, perhaps secondary to his fentanyl patch.  His pain is much improved.  We could decrease him to a 12 g patch but I would like to see how he does over the next one to 2 days.  He may take Ativan as needed, but I cautioned his wife about oversedation.  Plan: Continue radiation therapy as planned.  He will see Dr. Alvy Bimler tomorrow.

## 2015-01-29 NOTE — Progress Notes (Signed)
Pt gave himself 480 ml of boost and 240 ml water via peg without difficulty.

## 2015-01-29 NOTE — Progress Notes (Signed)
PAIN: He is currently in no pain.  SWALLOWING/DIET: Pt denies dysphagia. Pt reports a GT Supplement formula-Glucerna, 8 cans per day, 1400 ml H20 flush. Raw irritated dry mouth BOWEL: Pt reports, a bowel movement every day. SKIN: Skin exam reveals Dryness and erythema. Pt continues to apply Biafine as directed. OTHER: Pt brought to nursing via radiation therapist due to patient "shaking."  He states he "just doesn't feel right and has been like this since waking this morning."  He started on Duragesic Patch 26mcg on 01/27/15.  He reports he feels confused and disoriented.  However was able to state the date, who and where he was during assessment.  Noted shaking over bilateral hands. He reports the "patch" has helped significantly with his pain control.   WEIGHT/VS: Wt Readings from Last 3 Encounters:  01/29/15 168 lb 3.2 oz (76.295 kg)  01/27/15 165 lb (74.844 kg)  01/24/15 167 lb 6.4 oz (75.932 kg)   BP 167/86 mmHg  Pulse 91  Temp(Src) 98.3 F (36.8 C) (Oral)  Resp 12  Wt 168 lb 3.2 oz (76.295 kg)  SpO2 98% Orthostatic Standing Vital Signs: BP:158/76 P:90 Pox:100

## 2015-01-29 NOTE — Patient Instructions (Signed)
Dehydration, Adult Dehydration is when you lose more fluids from the body than you take in. Vital organs like the kidneys, brain, and heart cannot function without a proper amount of fluids and salt. Any loss of fluids from the body can cause dehydration.  CAUSES   Vomiting.  Diarrhea.  Excessive sweating.  Excessive urine output.  Fever. SYMPTOMS  Mild dehydration  Thirst.  Dry lips.  Slightly dry mouth. Moderate dehydration  Very dry mouth.  Sunken eyes.  Skin does not bounce back quickly when lightly pinched and released.  Dark urine and decreased urine production.  Decreased tear production.  Headache. Severe dehydration  Very dry mouth.  Extreme thirst.  Rapid, weak pulse (more than 100 beats per minute at rest).  Cold hands and feet.  Not able to sweat in spite of heat and temperature.  Rapid breathing.  Blue lips.  Confusion and lethargy.  Difficulty being awakened.  Minimal urine production.  No tears. DIAGNOSIS  Your caregiver will diagnose dehydration based on your symptoms and your exam. Blood and urine tests will help confirm the diagnosis. The diagnostic evaluation should also identify the cause of dehydration. TREATMENT  Treatment of mild or moderate dehydration can often be done at home by increasing the amount of fluids that you drink. It is best to drink small amounts of fluid more often. Drinking too much at one time can make vomiting worse. Refer to the home care instructions below. Severe dehydration needs to be treated at the hospital where you will probably be given intravenous (IV) fluids that contain water and electrolytes. HOME CARE INSTRUCTIONS   Ask your caregiver about specific rehydration instructions.  Drink enough fluids to keep your urine clear or pale yellow.  Drink small amounts frequently if you have nausea and vomiting.  Eat as you normally do.  Avoid:  Foods or drinks high in sugar.  Carbonated  drinks.  Juice.  Extremely hot or cold fluids.  Drinks with caffeine.  Fatty, greasy foods.  Alcohol.  Tobacco.  Overeating.  Gelatin desserts.  Wash your hands well to avoid spreading bacteria and viruses.  Only take over-the-counter or prescription medicines for pain, discomfort, or fever as directed by your caregiver.  Ask your caregiver if you should continue all prescribed and over-the-counter medicines.  Keep all follow-up appointments with your caregiver. SEEK MEDICAL CARE IF:  You have abdominal pain and it increases or stays in one area (localizes).  You have a rash, stiff neck, or severe headache.  You are irritable, sleepy, or difficult to awaken.  You are weak, dizzy, or extremely thirsty. SEEK IMMEDIATE MEDICAL CARE IF:   You are unable to keep fluids down or you get worse despite treatment.  You have frequent episodes of vomiting or diarrhea.  You have blood or green matter (bile) in your vomit.  You have blood in your stool or your stool looks black and tarry.  You have not urinated in 6 to 8 hours, or you have only urinated a small amount of very dark urine.  You have a fever.  You faint. MAKE SURE YOU:   Understand these instructions.  Will watch your condition.  Will get help right away if you are not doing well or get worse. Document Released: 06/28/2005 Document Revised: 09/20/2011 Document Reviewed: 02/15/2011 ExitCare Patient Information 2015 [employer]. This information is not intended to replace advice given to you by your health care provider. Make sure you discuss any questions you have with your health care   provider.  

## 2015-01-30 ENCOUNTER — Other Ambulatory Visit (HOSPITAL_COMMUNITY): Payer: Self-pay | Admitting: Radiology

## 2015-01-30 ENCOUNTER — Other Ambulatory Visit (HOSPITAL_BASED_OUTPATIENT_CLINIC_OR_DEPARTMENT_OTHER): Payer: Medicare Other

## 2015-01-30 ENCOUNTER — Ambulatory Visit (HOSPITAL_BASED_OUTPATIENT_CLINIC_OR_DEPARTMENT_OTHER): Payer: Medicare Other | Admitting: Hematology and Oncology

## 2015-01-30 ENCOUNTER — Ambulatory Visit: Payer: Medicare Other

## 2015-01-30 ENCOUNTER — Ambulatory Visit
Admission: RE | Admit: 2015-01-30 | Discharge: 2015-01-30 | Disposition: A | Discharge status: Home / Self Care | Payer: Medicare Other | Source: Ambulatory Visit | Attending: Radiation Oncology | Admitting: Radiation Oncology

## 2015-01-30 ENCOUNTER — Encounter: Payer: Self-pay | Admitting: *Deleted

## 2015-01-30 ENCOUNTER — Ambulatory Visit (HOSPITAL_BASED_OUTPATIENT_CLINIC_OR_DEPARTMENT_OTHER): Payer: Medicare Other

## 2015-01-30 ENCOUNTER — Encounter: Payer: Self-pay | Admitting: Hematology and Oncology

## 2015-01-30 ENCOUNTER — Telehealth: Payer: Self-pay | Admitting: Hematology and Oncology

## 2015-01-30 VITALS — BP 150/63 | HR 92 | Temp 98.3°F | Resp 18 | Ht 68.0 in | Wt 161.1 lb

## 2015-01-30 DIAGNOSIS — E875 Hyperkalemia: Secondary | ICD-10-CM | POA: Diagnosis not present

## 2015-01-30 DIAGNOSIS — Z95828 Presence of other vascular implants and grafts: Secondary | ICD-10-CM

## 2015-01-30 DIAGNOSIS — C119 Malignant neoplasm of nasopharynx, unspecified: Secondary | ICD-10-CM | POA: Diagnosis not present

## 2015-01-30 DIAGNOSIS — E1122 Type 2 diabetes mellitus with diabetic chronic kidney disease: Secondary | ICD-10-CM | POA: Diagnosis not present

## 2015-01-30 DIAGNOSIS — Z931 Gastrostomy status: Secondary | ICD-10-CM

## 2015-01-30 DIAGNOSIS — R609 Edema, unspecified: Secondary | ICD-10-CM

## 2015-01-30 DIAGNOSIS — E46 Unspecified protein-calorie malnutrition: Secondary | ICD-10-CM

## 2015-01-30 DIAGNOSIS — D6959 Other secondary thrombocytopenia: Secondary | ICD-10-CM

## 2015-01-30 DIAGNOSIS — D6481 Anemia due to antineoplastic chemotherapy: Secondary | ICD-10-CM

## 2015-01-30 DIAGNOSIS — D701 Agranulocytosis secondary to cancer chemotherapy: Secondary | ICD-10-CM | POA: Insufficient documentation

## 2015-01-30 DIAGNOSIS — T50905A Adverse effect of unspecified drugs, medicaments and biological substances, initial encounter: Secondary | ICD-10-CM

## 2015-01-30 DIAGNOSIS — T451X5A Adverse effect of antineoplastic and immunosuppressive drugs, initial encounter: Secondary | ICD-10-CM

## 2015-01-30 DIAGNOSIS — R600 Localized edema: Secondary | ICD-10-CM

## 2015-01-30 DIAGNOSIS — N182 Chronic kidney disease, stage 2 (mild): Secondary | ICD-10-CM | POA: Diagnosis not present

## 2015-01-30 LAB — COMPREHENSIVE METABOLIC PANEL (CC13)
ALT: 28 U/L (ref 0–55)
AST: 18 U/L (ref 5–34)
Albumin: 2 g/dL — ABNORMAL LOW (ref 3.5–5.0)
Alkaline Phosphatase: 61 U/L (ref 40–150)
Anion Gap: 10 mEq/L (ref 3–11)
BILIRUBIN TOTAL: 0.32 mg/dL (ref 0.20–1.20)
BUN: 36 mg/dL — AB (ref 7.0–26.0)
CO2: 24 meq/L (ref 22–29)
Calcium: 8.5 mg/dL (ref 8.4–10.4)
Chloride: 99 mEq/L (ref 98–109)
Creatinine: 1.4 mg/dL — ABNORMAL HIGH (ref 0.7–1.3)
EGFR: 52 mL/min/{1.73_m2} — ABNORMAL LOW (ref >90)
GLUCOSE: 287 mg/dL — AB (ref 70–140)
Potassium: 4.7 mEq/L (ref 3.5–5.1)
SODIUM: 133 meq/L — AB (ref 136–145)
Total Protein: 5.2 g/dL — ABNORMAL LOW (ref 6.4–8.3)

## 2015-01-30 LAB — CBC WITH DIFFERENTIAL/PLATELET
BASO%: 0.3 % (ref 0.0–2.0)
Basophils Absolute: 0 10*3/uL (ref 0.0–0.1)
EOS%: 0.2 % (ref 0.0–7.0)
Eosinophils Absolute: 0 10*3/uL (ref 0.0–0.5)
HEMATOCRIT: 35.4 % — AB (ref 38.4–49.9)
HEMOGLOBIN: 12 g/dL — AB (ref 13.0–17.1)
LYMPH%: 4 % — ABNORMAL LOW (ref 14.0–49.0)
MCH: 28.5 pg (ref 27.2–33.4)
MCHC: 34 g/dL (ref 32.0–36.0)
MCV: 83.9 fL (ref 79.3–98.0)
MONO#: 0.2 10*3/uL (ref 0.1–0.9)
MONO%: 9.9 % (ref 0.0–14.0)
NEUT#: 2 10*3/uL (ref 1.5–6.5)
NEUT%: 85.6 % — AB (ref 39.0–75.0)
PLATELETS: 130 10*3/uL — AB (ref 140–400)
RBC: 4.22 10*6/uL (ref 4.20–5.82)
RDW: 14.3 % (ref 11.0–14.6)
WBC: 2.4 10*3/uL — ABNORMAL LOW (ref 4.0–10.3)
lymph#: 0.1 10*3/uL — ABNORMAL LOW (ref 0.9–3.3)

## 2015-01-30 LAB — MAGNESIUM (CC13): Magnesium: 1.5 mg/dl (ref 1.5–2.5)

## 2015-01-30 LAB — TECHNOLOGIST REVIEW

## 2015-01-30 MED ORDER — SODIUM CHLORIDE 0.9 % IV SOLN
Freq: Once | INTRAVENOUS | Status: AC
  Administered 2015-01-30: 09:00:00 via INTRAVENOUS

## 2015-01-30 MED ORDER — DEXAMETHASONE 4 MG PO TABS: 4.0000 mg | ORAL_TABLET | Freq: Two times a day (BID) | ORAL | Status: DC

## 2015-01-30 MED ORDER — SODIUM CHLORIDE 0.9 % IJ SOLN
10.0000 mL | INTRAMUSCULAR | Status: DC | PRN
  Administered 2015-01-30: 10 mL via INTRAVENOUS
  Filled 2015-01-30: qty 10

## 2015-01-30 MED ORDER — HEPARIN SOD (PORK) LOCK FLUSH 100 UNIT/ML IV SOLN
250.0000 [IU] | Freq: Once | INTRAVENOUS | Status: DC | PRN
  Filled 2015-01-30: qty 5

## 2015-01-30 MED ORDER — HEPARIN SOD (PORK) LOCK FLUSH 100 UNIT/ML IV SOLN
500.0000 [IU] | Freq: Once | INTRAVENOUS | Status: AC | PRN
  Administered 2015-01-30: 500 [IU]
  Filled 2015-01-30: qty 5

## 2015-01-30 NOTE — Assessment & Plan Note (Signed)
The patient had progressive weight loss. He has started to use his tube feeds and is using a cans per day. I continue to encourage him to increase his oral intake if tolerated. If not, I reinforced the importance of using his feeding tube.

## 2015-01-30 NOTE — Telephone Encounter (Signed)
Appointments complete per 7/21 pof. Patient aware he will be given avs and appointments for July and August in infusion.

## 2015-01-30 NOTE — Assessment & Plan Note (Signed)
This is likely due to recent treatment. The patient denies recent history of bleeding such as epistaxis, hematuria or hematochezia. He is asymptomatic from the anemia. I will observe for now.  He does not require transfusion now. I will continue the chemotherapy at current dose without dosage adjustment.  If the anemia gets progressive worse in the future, I might have to delay his treatment or adjust the chemotherapy dose.  

## 2015-01-30 NOTE — Assessment & Plan Note (Signed)
This is likely due to recent treatment. The patient denies recent history of fevers, cough, chills, diarrhea or dysuria. He is asymptomatic from the leukopenia. I will observe for now.  I will continue the chemotherapy at current dose without dosage adjustment.  If the leukopenia gets progressive worse in the future, I might have to delay his treatment or adjust the chemotherapy dose. 

## 2015-01-30 NOTE — Assessment & Plan Note (Signed)
This is likely due to recent treatment. The patient denies recent history of bleeding such as epistaxis, hematuria or hematochezia. He is asymptomatic from the low platelet count. I will observe for now.  he does not require transfusion now. I will continue the chemotherapy at current dose without dosage adjustment.  If the thrombocytopenia gets progressive worse in the future, I might have to delay his treatment or adjust the chemotherapy dose.   

## 2015-01-30 NOTE — Assessment & Plan Note (Signed)
Feeding tube site looks okay.  He will continue flushes.

## 2015-01-30 NOTE — Patient Instructions (Signed)
Dehydration, Adult Dehydration is when you lose more fluids from the body than you take in. Vital organs like the kidneys, brain, and heart cannot function without a proper amount of fluids and salt. Any loss of fluids from the body can cause dehydration.  CAUSES   Vomiting.  Diarrhea.  Excessive sweating.  Excessive urine output.  Fever. SYMPTOMS  Mild dehydration  Thirst.  Dry lips.  Slightly dry mouth. Moderate dehydration  Very dry mouth.  Sunken eyes.  Skin does not bounce back quickly when lightly pinched and released.  Dark urine and decreased urine production.  Decreased tear production.  Headache. Severe dehydration  Very dry mouth.  Extreme thirst.  Rapid, weak pulse (more than 100 beats per minute at rest).  Cold hands and feet.  Not able to sweat in spite of heat and temperature.  Rapid breathing.  Blue lips.  Confusion and lethargy.  Difficulty being awakened.  Minimal urine production.  No tears. DIAGNOSIS  Your caregiver will diagnose dehydration based on your symptoms and your exam. Blood and urine tests will help confirm the diagnosis. The diagnostic evaluation should also identify the cause of dehydration. TREATMENT  Treatment of mild or moderate dehydration can often be done at home by increasing the amount of fluids that you drink. It is best to drink small amounts of fluid more often. Drinking too much at one time can make vomiting worse. Refer to the home care instructions below. Severe dehydration needs to be treated at the hospital where you will probably be given intravenous (IV) fluids that contain water and electrolytes. HOME CARE INSTRUCTIONS   Ask your caregiver about specific rehydration instructions.  Drink enough fluids to keep your urine clear or pale yellow.  Drink small amounts frequently if you have nausea and vomiting.  Eat as you normally do.  Avoid:  Foods or drinks high in sugar.  Carbonated  drinks.  Juice.  Extremely hot or cold fluids.  Drinks with caffeine.  Fatty, greasy foods.  Alcohol.  Tobacco.  Overeating.  Gelatin desserts.  Wash your hands well to avoid spreading bacteria and viruses.  Only take over-the-counter or prescription medicines for pain, discomfort, or fever as directed by your caregiver.  Ask your caregiver if you should continue all prescribed and over-the-counter medicines.  Keep all follow-up appointments with your caregiver. SEEK MEDICAL CARE IF:  You have abdominal pain and it increases or stays in one area (localizes).  You have a rash, stiff neck, or severe headache.  You are irritable, sleepy, or difficult to awaken.  You are weak, dizzy, or extremely thirsty. SEEK IMMEDIATE MEDICAL CARE IF:   You are unable to keep fluids down or you get worse despite treatment.  You have frequent episodes of vomiting or diarrhea.  You have blood or green matter (bile) in your vomit.  You have blood in your stool or your stool looks black and tarry.  You have not urinated in 6 to 8 hours, or you have only urinated a small amount of very dark urine.  You have a fever.  You faint. MAKE SURE YOU:   Understand these instructions.  Will watch your condition.  Will get help right away if you are not doing well or get worse. Document Released: 06/28/2005 Document Revised: 09/20/2011 Document Reviewed: 02/15/2011 ExitCare Patient Information 2015 [employer]. This information is not intended to replace advice given to you by your health care provider. Make sure you discuss any questions you have with your health care   provider.  

## 2015-01-30 NOTE — Assessment & Plan Note (Signed)
Overall, he has started to experience more side effects with treatment. He is feeling a little better with aggressive pain management and felt that the fentanyl patch is helping him He wants to continue IV fluid support and finish his last dose of chemotherapy next week. I will continue to see him on a weekly basis and to provide supportive care.

## 2015-01-30 NOTE — Patient Instructions (Signed)

## 2015-01-30 NOTE — Progress Notes (Signed)
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   12/23/2014 -  Chemotherapy  he received weekly cisplatin   12/23/2014 -  Radiation Therapy  he received radiation treatment    INTERVAL HISTORY: Please see below for problem oriented charting. He returns for further follow-up. He was placed on fentanyl patch early this week with significant improvement of mucositis pain. He denies nausea vomiting. Leg swelling is stable. History of using the feeding tube consistently. Denies nausea or vomiting.  REVIEW OF SYSTEMS:   Constitutional: Denies fevers, chills or abnormal weight loss Eyes: Denies blurriness of vision Respiratory: Denies cough, dyspnea or wheezes Cardiovascular: Denies palpitation, chest discomfort  Gastrointestinal:  Denies nausea, heartburn or change in bowel habits Skin: Denies abnormal skin rashes Lymphatics: Denies new lymphadenopathy or easy bruising Neurological:Denies numbness, tingling or new weaknesses Behavioral/Psych: Mood is stable, no new changes  All other systems were reviewed with the patient and are negative.  I have reviewed the past medical history, past surgical history, social history and family history with the patient and they are unchanged  from previous note.  ALLERGIES:  has No Known Allergies.  MEDICATIONS:  Current Outpatient Prescriptions  Medication Sig Dispense Refill  . acetaminophen (TYLENOL) 500 MG tablet Take 500-1,000 mg by mouth every 6 (six) hours as needed for mild pain.    Marland Kitchen Alum & Mag Hydroxide-Simeth (MAGIC MOUTHWASH W/LIDOCAINE) SOLN Take 10 mLs by mouth 4 (four) times daily as needed for mouth pain. 500 mL 1  . amLODipine (NORVASC) 5 MG tablet Take 5 mg by mouth every morning.     Marland Kitchen dexamethasone (DECADRON) 4 MG tablet Take 1 tablet (4 mg total) by mouth 2 (two) times daily with a meal. 60 tablet 0  . diphenhydrAMINE (BENADRYL) 25 MG tablet Take 25 mg by mouth every 6 (six) hours as needed.    Marland Kitchen emollient (BIAFINE) cream Apply 1 application topically daily.    . fentaNYL (DURAGESIC - DOSED MCG/HR) 25 MCG/HR patch Place 1 patch (25 mcg total) onto the skin every 3 (three) days. 5 patch 0  . glimepiride (AMARYL) 2 MG tablet Take 2 mg by mouth 2 (two) times daily.   4  . lidocaine-prilocaine (EMLA) cream Apply to affected area once 30 g 3  . LORazepam (ATIVAN) 1 MG tablet Take 1 tablet (1 mg total) by mouth 2 (two) times daily as needed for anxiety. 40 tablet 0  . Multiple Vitamins-Minerals (CENTRUM SILVER PO) Take by mouth daily.    . Omega-3 Fatty Acids (FISH OIL PO) Take by mouth daily.    . ondansetron (ZOFRAN) 8 MG tablet Take 1 tablet (8 mg total) by mouth every 8 (eight) hours as needed. 60 tablet 1  . oxyCODONE-acetaminophen (PERCOCET) 5-325 MG per tablet Take one or two tablets by mouth every 4-6 hours as needed for pain. 50 tablet 0  .  prochlorperazine (COMPAZINE) 10 MG tablet Take 1 tablet (10 mg total) by mouth every 6 (six) hours as needed (Nausea or vomiting). 60 tablet 1   No current facility-administered medications for this visit.   Facility-Administered Medications Ordered in Other Visits  Medication Dose Route Frequency Provider Last Rate Last Dose  . heparin lock flush 100 unit/mL  250  Units Intracatheter Once PRN Heath Lark, MD        PHYSICAL EXAMINATION: ECOG PERFORMANCE STATUS: 1 - Symptomatic but completely ambulatory  Filed Vitals:   01/30/15 0821  BP: 150/63  Pulse: 92  Temp: 98.3 F (36.8 C)  Resp: 18   Filed Weights   01/30/15 0821  Weight: 161 lb 1.6 oz (73.074 kg)    GENERAL:alert, no distress and comfortable SKIN: skin color, texture, turgor are normal, no rashes or significant lesions. Noted mild radiation-induced dermatitis EYES: normal, Conjunctiva are pink and non-injected, sclera clear OROPHARYNX: Noticed significant mucositis with ulceration. No thrush. NECK: supple, thyroid normal size, non-tender, without nodularity LYMPH:  Lymphadenopathy is improving.  LUNGS: clear to auscultation and percussion with normal breathing effort HEART: regular rate & rhythm and no murmurs and no lower extremity edema ABDOMEN:abdomen soft, non-tender and normal bowel sounds. Feeding tube site looks okay Musculoskeletal:no cyanosis of digits and no clubbing  NEURO: alert & oriented x 3 with fluent speech, no focal motor/sensory deficits  LABORATORY DATA:  I have reviewed the data as listed    Component Value Date/Time   NA 133* 01/30/2015 0803   K 4.7 01/30/2015 0803   CO2 24 01/30/2015 0803   GLUCOSE 287* 01/30/2015 0803   BUN 36.0* 01/30/2015 0803   CREATININE 1.4* 01/30/2015 0803   CALCIUM 8.5 01/30/2015 0803   PROT 5.2* 01/30/2015 0803   ALBUMIN 2.0* 01/30/2015 0803   AST 18 01/30/2015 0803   ALT 28 01/30/2015 0803   ALKPHOS 61 01/30/2015 0803   BILITOT 0.32 01/30/2015 0803    No results found for: SPEP, UPEP  Lab Results  Component Value Date   WBC 2.4* 01/30/2015   NEUTROABS 2.0 01/30/2015   HGB 12.0* 01/30/2015   HCT 35.4* 01/30/2015   MCV 83.9 01/30/2015   PLT 130* 01/30/2015      Chemistry      Component Value Date/Time   NA 133* 01/30/2015 0803   K 4.7 01/30/2015 0803   CO2 24 01/30/2015 0803   BUN 36.0* 01/30/2015 0803    CREATININE 1.4* 01/30/2015 0803      Component Value Date/Time   CALCIUM 8.5 01/30/2015 0803   ALKPHOS 61 01/30/2015 0803   AST 18 01/30/2015 0803   ALT 28 01/30/2015 0803   BILITOT 0.32 01/30/2015 0803     ASSESSMENT & PLAN:  Nasopharyngeal cancer Overall, he has started to experience more side effects with treatment. He is feeling a little better with aggressive pain management and felt that the fentanyl patch is helping him He wants to continue IV fluid support and finish his last dose of chemotherapy next week. I will continue to see him on a weekly basis and to provide supportive care.  Anemia due to antineoplastic chemotherapy This is likely due to recent treatment. The patient denies recent history of bleeding such as epistaxis, hematuria or hematochezia. He is asymptomatic from the anemia. I will observe for now.  He does not require transfusion now. I will continue the chemotherapy at current dose without dosage adjustment.  If the anemia gets progressive worse in the future, I might have to  delay his treatment or adjust the chemotherapy dose.     Bilateral lower extremity edema This is related to low albumin state. I recommended increase physical activity as tolerated. He does not need diuretic therapy   Protein calorie malnutrition The patient had progressive weight loss. He has started to use his tube feeds and is using a cans per day. I continue to encourage him to increase his oral intake if tolerated. If not, I reinforced the importance of using his feeding tube.    S/P gastrostomy Feeding tube site looks okay.  He will continue flushes.   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 He can proceed with chemotherapy as long as creatinine remains under  2     Thrombocytopenia due to drugs This is likely due to recent treatment. The patient denies recent history of bleeding such as epistaxis, hematuria or hematochezia. He is asymptomatic from the low platelet count. I will observe for now.  he does not require transfusion now. I will continue the chemotherapy at current dose without dosage adjustment.  If the thrombocytopenia gets progressive worse in the future, I might have to delay his treatment or adjust the chemotherapy dose.    Leukopenia due to antineoplastic chemotherapy This is likely due to recent treatment. The patient denies recent history of fevers, cough, chills, diarrhea or dysuria. He is asymptomatic from the leukopenia. I will observe for now.  I will continue the chemotherapy at current dose without dosage adjustment.  If the leukopenia gets progressive worse in the future, I might have to delay his treatment or adjust the chemotherapy dose.     No orders of the defined types were placed in this encounter.   All questions were answered. The patient knows to call the clinic with any problems, questions or concerns. No barriers to learning was detected. I spent 30 minutes counseling the patient face to face. The total time spent in the appointment was 40 minutes and more than 50% was on counseling and review of test results     [facility], [place], MD 01/30/2015 1:28 PM

## 2015-01-30 NOTE — Assessment & Plan Note (Signed)
He has reduced creatinine clearance which is chronic I am willing to proceed with chemotherapy, dose adjustment by reducing chemotherapy by 25% weekly and aggressive fluid hydration. He can proceed with chemotherapy as long as creatinine remains under 2

## 2015-01-30 NOTE — Assessment & Plan Note (Signed)
This is related to low albumin state. I recommended increase physical activity as tolerated. He does not need diuretic therapy

## 2015-01-31 ENCOUNTER — Encounter: Payer: Self-pay | Admitting: *Deleted

## 2015-01-31 ENCOUNTER — Ambulatory Visit
Admission: RE | Admit: 2015-01-31 | Discharge: 2015-01-31 | Disposition: A | Discharge status: Home / Self Care | Payer: Medicare Other | Source: Ambulatory Visit | Attending: Radiation Oncology | Admitting: Radiation Oncology

## 2015-01-31 ENCOUNTER — Ambulatory Visit (HOSPITAL_COMMUNITY)
Admission: RE | Admit: 2015-01-31 | Discharge: 2015-01-31 | Disposition: A | Discharge status: Home / Self Care | Payer: Medicare Other | Source: Ambulatory Visit | Attending: Radiology | Admitting: Radiology

## 2015-01-31 ENCOUNTER — Telehealth: Payer: Self-pay | Admitting: *Deleted

## 2015-01-31 ENCOUNTER — Ambulatory Visit: Payer: Self-pay

## 2015-01-31 DIAGNOSIS — C119 Malignant neoplasm of nasopharynx, unspecified: Secondary | ICD-10-CM

## 2015-01-31 NOTE — Progress Notes (Unsigned)
  Oncology Nurse Navigator Documentation   Navigator Encounter Type: Other (01/31/15 1020)        Met patient and his wife after [place].  Addressed wife's morning VM request for MMW refill, patient indicated it is helping him maintain oral intake at mealtime. I notified Dr. Isidore Moos.  Per her direction, I called [facility] with an Rx including 3 refills.  LVM on wife's phone that order had been placed.  Patient denied need for today's scheduled IVF, asked for it to be cancelled.  Patient expressed concern that scabbed sore on nose is not healing.  He will address with Dr. Valere Dross on Monday.  They proceeded to IR to have PAC stitch assessed.   Gayleen Orem, RN, BSN, [place] at [place] [phone]

## 2015-01-31 NOTE — Telephone Encounter (Signed)
  Oncology Nurse Navigator Documentation   Navigator Encounter Type: Other (01/31/15 1210)      Per Dr. Isidore Moos, called [facility] with order for MMW with 3 refills.  Spoke with Opal Sidles.   Gayleen Orem, RN, BSN, [place] at [place] [phone]

## 2015-01-31 NOTE — Progress Notes (Signed)
  Oncology Nurse Navigator Documentation   Navigator Encounter Type: Other (01/30/15 1030)       To provide support and encouragement, care continuity and to assess for needs, saw patient in Infusion where he was completing scheduled IVF. He stated throat pain is much better managed since starting Fentanyl this week.  We discussed importance of recording frequency of taking PRN pain med while at current dosage so adjustment can be made as necessary.  He verbalized understanding. He indicated he needed more Glucerna.  Nutritionist had no Glucerna but provided 6-8 bottles of Ensure Glucose Control which I delivered to patient. He did not express any further needs, understands he can contact me.      Gayleen Orem, RN, BSN, [place] at [place] [phone]

## 2015-02-03 ENCOUNTER — Other Ambulatory Visit: Payer: Self-pay | Admitting: Hematology and Oncology

## 2015-02-03 ENCOUNTER — Ambulatory Visit (HOSPITAL_BASED_OUTPATIENT_CLINIC_OR_DEPARTMENT_OTHER): Payer: Medicare Other | Admitting: Hematology and Oncology

## 2015-02-03 ENCOUNTER — Other Ambulatory Visit: Payer: Self-pay | Admitting: *Deleted

## 2015-02-03 ENCOUNTER — Ambulatory Visit
Admission: RE | Admit: 2015-02-03 | Discharge: 2015-02-03 | Disposition: A | Discharge status: Home / Self Care | Payer: Medicare Other | Source: Ambulatory Visit | Attending: Radiation Oncology | Admitting: Radiation Oncology

## 2015-02-03 ENCOUNTER — Ambulatory Visit (HOSPITAL_BASED_OUTPATIENT_CLINIC_OR_DEPARTMENT_OTHER): Payer: Medicare Other

## 2015-02-03 ENCOUNTER — Ambulatory Visit: Payer: Medicare Other | Admitting: Nutrition

## 2015-02-03 ENCOUNTER — Encounter: Payer: Self-pay | Admitting: Hematology and Oncology

## 2015-02-03 VITALS — BP 151/82 | HR 89 | Temp 98.2°F | Resp 10 | Wt 155.5 lb

## 2015-02-03 VITALS — BP 131/78 | HR 91 | Temp 97.2°F | Resp 18

## 2015-02-03 VITALS — BP 131/78 | HR 91 | Temp 97.2°F

## 2015-02-03 DIAGNOSIS — R21 Rash and other nonspecific skin eruption: Secondary | ICD-10-CM

## 2015-02-03 DIAGNOSIS — E1129 Type 2 diabetes mellitus with other diabetic kidney complication: Secondary | ICD-10-CM

## 2015-02-03 DIAGNOSIS — C119 Malignant neoplasm of nasopharynx, unspecified: Secondary | ICD-10-CM

## 2015-02-03 DIAGNOSIS — Z5111 Encounter for antineoplastic chemotherapy: Secondary | ICD-10-CM

## 2015-02-03 DIAGNOSIS — E46 Unspecified protein-calorie malnutrition: Secondary | ICD-10-CM | POA: Diagnosis not present

## 2015-02-03 DIAGNOSIS — L270 Generalized skin eruption due to drugs and medicaments taken internally: Secondary | ICD-10-CM | POA: Insufficient documentation

## 2015-02-03 MED ORDER — HEPARIN SOD (PORK) LOCK FLUSH 100 UNIT/ML IV SOLN
500.0000 [IU] | Freq: Once | INTRAVENOUS | Status: AC | PRN
  Administered 2015-02-03: 500 [IU]
  Filled 2015-02-03: qty 5

## 2015-02-03 MED ORDER — BIAFINE EX EMUL
Freq: Two times a day (BID) | CUTANEOUS | Status: DC
  Administered 2015-02-03: 17:00:00 via TOPICAL

## 2015-02-03 MED ORDER — PALONOSETRON HCL INJECTION 0.25 MG/5ML
0.2500 mg | Freq: Once | INTRAVENOUS | Status: AC
  Administered 2015-02-03: 0.25 mg via INTRAVENOUS

## 2015-02-03 MED ORDER — FOSAPREPITANT DIMEGLUMINE INJECTION 150 MG
Freq: Once | INTRAVENOUS | Status: AC
  Administered 2015-02-03: 11:00:00 via INTRAVENOUS
  Filled 2015-02-03: qty 5

## 2015-02-03 MED ORDER — GLUCERNA 1.5 CAL PO LIQD: ORAL | Status: DC

## 2015-02-03 MED ORDER — SODIUM CHLORIDE 0.9 % IJ SOLN
10.0000 mL | INTRAMUSCULAR | Status: DC | PRN
  Administered 2015-02-03: 10 mL
  Filled 2015-02-03: qty 10

## 2015-02-03 MED ORDER — MAGNESIUM SULFATE 50 % IJ SOLN
Freq: Once | INTRAVENOUS | Status: AC
  Administered 2015-02-03: 09:00:00 via INTRAVENOUS
  Filled 2015-02-03: qty 1000

## 2015-02-03 MED ORDER — PALONOSETRON HCL INJECTION 0.25 MG/5ML
INTRAVENOUS | Status: AC
  Filled 2015-02-03: qty 5

## 2015-02-03 MED ORDER — SODIUM CHLORIDE 0.9 % IV SOLN
30.0000 mg/m2 | Freq: Once | INTRAVENOUS | Status: AC
  Administered 2015-02-03: 59 mg via INTRAVENOUS
  Filled 2015-02-03: qty 59

## 2015-02-03 NOTE — Addendum Note (Signed)
Encounter addended by: Jenene Slicker, RN on: 02/03/2015  4:49 PM<BR>     Documentation filed: Orders, Dx Association, Inpatient [facility]

## 2015-02-03 NOTE — Assessment & Plan Note (Signed)
The skin rashes resemble molluscum contagiosum. This is not a contraindication to proceed with treatment. I recommend treating him in a private room. We will observe closely.

## 2015-02-03 NOTE — Progress Notes (Signed)
Weekly Management Note:  Site: nasopharynx/bilateral neck Current Dose:   6000  cGy Projected Dose:  7000  cGy  Narrative: The patient is seen today for routine under treatment assessment. CBCT/MVCT images/port films were reviewed. The chart was reviewed.    He is feeling better this week, but he is lost another 6 pounds over the past 5 days. He was seen by  Ernestene Kiel of nutrition and given additional instructions to take an 6 one half cans of  Glucerna day. He  Recently developed hemorrhagic papules along his extremities and abdomen which Dr. Alvy Bimler looked at this morning.  I'm not sure if this is allergic reaction to medication (question fentanyl).  His throat pain is well-controlled with his fentanyl patch.  He is not taking any breakthrough pain medication.  Physical Examination:  Filed Vitals:   02/03/15 1346  BP: 151/82  Pulse: 89  Temp: 98.2 F (36.8 C)  Resp: 10  .  Weight: 155 lb 8 oz (70.534 kg).  On inspection of the oropharynx there is a confluent mucositis.  No evidence for candidiasis. His right neck adenopathy has almost completely regressed, however he still has left upper posterior neck adenopathy measuring approximately 3 cm in diameter. This slow regression is probably from the fact that he had necrotic nodes.   Laboratory data : Lab Results  Component Value Date   WBC 2.4* 01/30/2015   HGB 12.0* 01/30/2015   HCT 35.4* 01/30/2015   MCV 83.9 01/30/2015   PLT 130* 01/30/2015     Impression: Tolerating radiation therapy well.  I'm unsure as to the etiology of his hemorrhagic papules along his extremities and abdomen. He was seen by Dr. Alvy Bimler who is just observing his skin lesions.  Plan: Continue radiation therapy as planned.

## 2015-02-03 NOTE — Progress Notes (Signed)
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   12/23/2014 -  Chemotherapy  he received weekly cisplatin   12/23/2014 -  Radiation Therapy  he received radiation treatment    INTERVAL HISTORY: Please see below for problem oriented charting. He is seen urgently at the infusion room because of diffuse skin rash that developed over the weekend.. He denies exposure to new medications or contact allergies. Denies exposure to sick individuals over the weekend It is mildly itchy. He otherwise is able to tolerate 8 cans of nutritional supplement through his feeding tube along with water intake. His legs less swollen. His pain appears to be well controlled  REVIEW OF SYSTEMS:   Constitutional: Denies fevers, chills or abnormal weight loss Eyes: Denies blurriness of vision Respiratory: Denies cough, dyspnea or wheezes Cardiovascular: Denies palpitation, chest discomfort or lower extremity swelling Gastrointestinal:  Denies nausea, heartburn or change in bowel habits Lymphatics: Denies new lymphadenopathy or easy bruising Neurological:Denies numbness, tingling or new weaknesses Behavioral/Psych: Mood is stable, no new changes  All other systems were reviewed with the patient and are  negative.  I have reviewed the past medical history, past surgical history, social history and family history with the patient and they are unchanged from previous note.  ALLERGIES:  has No Known Allergies.  MEDICATIONS:  Current Outpatient Prescriptions  Medication Sig Dispense Refill  . acetaminophen (TYLENOL) 500 MG tablet Take 500-1,000 mg by mouth every 6 (six) hours as needed for mild pain.    Marland Kitchen Alum & Mag Hydroxide-Simeth (MAGIC MOUTHWASH W/LIDOCAINE) SOLN Take 10 mLs by mouth 4 (four) times daily as needed for mouth pain. 500 mL 1  . amLODipine (NORVASC) 5 MG tablet Take 5 mg by mouth every morning.     Marland Kitchen dexamethasone (DECADRON) 4 MG tablet Take 1 tablet (4 mg total) by mouth 2 (two) times daily with a meal. 60 tablet 0  . diphenhydrAMINE (BENADRYL) 25 MG tablet Take 25 mg by mouth every 6 (six) hours as needed.    Marland Kitchen emollient (BIAFINE) cream Apply 1 application topically daily.    . fentaNYL (DURAGESIC - DOSED MCG/HR) 25 MCG/HR patch Place 1 patch (25 mcg total) onto the skin every 3 (three) days. 5 patch 0  . glimepiride (AMARYL) 2 MG tablet Take 2 mg by mouth 2 (two) times daily.   4  . lidocaine-prilocaine (EMLA) cream Apply to affected area once 30 g 3  . LORazepam (ATIVAN) 1 MG tablet Take 1 tablet (1 mg total) by mouth 2 (two) times daily as needed for anxiety. 40 tablet 0  . Multiple Vitamins-Minerals (CENTRUM SILVER PO) Take by mouth daily.    . Nutritional Supplements (FEEDING SUPPLEMENT, GLUCERNA 1.5 CAL,) LIQD Give 2 cans Glucerna 1.5 once daily via PEG and 1.5 cans TID with 150 cc free water before and after each bolus feeding. Total 6.5  cans daily-100% nutrition needs via PEG. 6.5 Can   . Omega-3 Fatty Acids (FISH OIL PO) Take by mouth daily.    . ondansetron (ZOFRAN) 8 MG tablet Take 1 tablet (8 mg total) by mouth every 8 (eight) hours as needed. 60 tablet 1  . oxyCODONE-acetaminophen (PERCOCET) 5-325 MG per tablet Take one or two tablets by mouth every 4-6 hours as  needed for pain. 50 tablet 0  . prochlorperazine (COMPAZINE) 10 MG tablet Take 1 tablet (10 mg total) by mouth every 6 (six) hours as needed (Nausea or vomiting). 60 tablet 1   No current facility-administered medications for this visit.    PHYSICAL EXAMINATION: ECOG PERFORMANCE STATUS: 1 - Symptomatic but completely ambulatory  Filed Vitals:   02/03/15 0950  BP: 131/78  Pulse: 91  Temp: 97.2 F (36.2 C)  Resp: 18   There were no vitals filed for this visit.  GENERAL:alert, no distress and comfortable SKIN: he has skin lesions resembled molluscum contagiosum EYES: normal, Conjunctiva are pink and non-injected, sclera clear Musculoskeletal:no cyanosis of digits and no clubbing  NEURO: alert & oriented x 3 with fluent speech, no focal motor/sensory deficits  LABORATORY DATA:  I have reviewed the data as listed    Component Value Date/Time   NA 133* 01/30/2015 0803   K 4.7 01/30/2015 0803   CO2 24 01/30/2015 0803   GLUCOSE 287* 01/30/2015 0803   BUN 36.0* 01/30/2015 0803   CREATININE 1.4* 01/30/2015 0803   CALCIUM 8.5 01/30/2015 0803   PROT 5.2* 01/30/2015 0803   ALBUMIN 2.0* 01/30/2015 0803   AST 18 01/30/2015 0803   ALT 28 01/30/2015 0803   ALKPHOS 61 01/30/2015 0803   BILITOT 0.32 01/30/2015 0803    No results found for: SPEP, UPEP  Lab Results  Component Value Date   WBC 2.4* 01/30/2015   NEUTROABS 2.0 01/30/2015   HGB 12.0* 01/30/2015   HCT 35.4* 01/30/2015   MCV 83.9 01/30/2015   PLT 130* 01/30/2015      Chemistry      Component Value Date/Time   NA 133* 01/30/2015 0803   K 4.7 01/30/2015 0803   CO2 24 01/30/2015 0803   BUN 36.0* 01/30/2015 0803   CREATININE 1.4* 01/30/2015 0803      Component Value Date/Time   CALCIUM 8.5 01/30/2015 0803   ALKPHOS 61 01/30/2015 0803   AST 18 01/30/2015 0803   ALT 28 01/30/2015 0803   BILITOT 0.32 01/30/2015 0803      ASSESSMENT & PLAN:  Nasopharyngeal cancer Clinically, he is doing well. I will proceed  with last dose treatment today. There is no contraindication for him to proceed with treatment today.  Rash, skin The skin rashes resemble molluscum contagiosum. This is not a contraindication to proceed with treatment. I recommend treating him in a private room. We will observe closely.  Protein calorie malnutrition He is using his feeding tube well and is able to tolerate 8 cans of nutritional supplement. The patient requests, we will cancel IV fluid support this week   No orders of the defined types were placed in this encounter.   All questions were answered. The patient knows to call the clinic with any problems, questions or concerns. No barriers to learning was detected. I spent 15 minutes counseling the patient face to face. The total time spent in the appointment was 20 minutes and more than 50% was on counseling and review of test results     [facility], [place], MD 02/03/2015 3:08 PM

## 2015-02-03 NOTE — Assessment & Plan Note (Signed)
Clinically, he is doing well. I will proceed with last dose treatment today. There is no contraindication for him to proceed with treatment today.

## 2015-02-03 NOTE — Assessment & Plan Note (Signed)
He is using his feeding tube well and is able to tolerate 8 cans of nutritional supplement. The patient requests, we will cancel IV fluid support this week

## 2015-02-03 NOTE — Patient Instructions (Signed)
[facility] Discharge Instructions for Patients Receiving Chemotherapy  Today you received the following chemotherapy agents Cisplatin To help prevent nausea and vomiting after your treatment, we encourage you to take your nausea medication as prescribed.    If you develop nausea and vomiting that is not controlled by your nausea medication, call the clinic.   BELOW ARE SYMPTOMS THAT SHOULD BE REPORTED IMMEDIATELY:  *FEVER GREATER THAN 100.5 F  *CHILLS WITH OR WITHOUT FEVER  NAUSEA AND VOMITING THAT IS NOT CONTROLLED WITH YOUR NAUSEA MEDICATION  *UNUSUAL SHORTNESS OF BREATH  *UNUSUAL BRUISING OR BLEEDING  TENDERNESS IN MOUTH AND THROAT WITH OR WITHOUT PRESENCE OF ULCERS  *URINARY PROBLEMS  *BOWEL PROBLEMS  UNUSUAL RASH Items with * indicate a potential emergency and should be followed up as soon as possible.  Feel free to call the clinic you have any questions or concerns. The clinic phone number is [phone].  Please show the CHEMO ALERT CARD at check-in to the Emergency Department and triage nurse.   

## 2015-02-03 NOTE — Progress Notes (Signed)
PAIN: He rates his pain as a 3 on a scale of 0-10. constant, burning and stabbing over throat/tongue SWALLOWING/DIET: Pt has had dysphagia for both solids and liquids. Pt reports taking sips of water to take medication-formula-Glucerna 6 1/2 cans per day, 2300 ml H20 free water. Peg tube site appearance-no complaints slightly pink, small amount of yellow dressage. Oral exam reveals mucous membranes moist with clear and tenacious sputum.  BOWEL: Pt reports Constipation, reports having a bowel this morning with help of a suppository. SKIN: Skin exam reveals Dryness, Hyperpigmentation and erythema. Pt continues to apply Biafine as directed. OTHER: Pt complains of fatigue and weakness. Reports "shaking" from last week has improved.  Pain control has increased drastically with use of fentanyl patches.   WEIGHT/VS: Wt Readings from Last 3 Encounters:  02/03/15 155 lb 8 oz (70.534 kg)  01/30/15 161 lb 1.6 oz (73.074 kg)  01/29/15 168 lb 3.2 oz (76.295 kg)   BP 151/82 mmHg  Pulse 89  Temp(Src) 98.2 F (36.8 C) (Oral)  Resp 10  Wt 155 lb 8 oz (70.534 kg)  SpO2 99% Orthostatic Standing Vital Signs: BP:136/83 P:92 Pox:98&

## 2015-02-03 NOTE — Progress Notes (Signed)
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 Patient no longer able to eat anything by mouth.  Requires Tube feeding to meet 100% estimated needs. Weight decreased and documented as 161.1 pounds on July 21. Glucose 287, BUN 36, Creatinine 1.4, Albumin 2.0. Patient reports constipation associated with pain medications.  Estimated Nutrition Needs: 2200- 2400 kcal, 110-125 grams protein, 2.4 L fluid  Nutrition Diagnosis:  Predicted suboptimal intake has evolved into inadequate oral intake related to nasopharyngeal cancer as evidenced by 8% weight loss from Usual body weight.  Intervention:  Begin tube feeding of Glucerna 1.5 via PEG. Patient will infuse 2 cans at 0800 and 1.5 cans at 1200, 1600, and 2000. Patient will flush with 150 cc free water before and after tube feedings. Glucerna 1.5 will provide 2314 kcal, 127 grams protein, 2370 ml free water. Orders called into [facility]. Patient and wife instructed on TF administration and given written instructions. Questions answered, teach back method used.  Monitoring, Evaluation, Goals:  Patient will tolerate TF to meet 100% estimated needs to minimize weight loss.  Next Visit:  Patient will call with questions. (today is last chemotherapy per patient. Radiation continues)

## 2015-02-04 ENCOUNTER — Ambulatory Visit: Payer: Self-pay

## 2015-02-04 ENCOUNTER — Ambulatory Visit
Admission: RE | Admit: 2015-02-04 | Discharge: 2015-02-04 | Disposition: A | Discharge status: Home / Self Care | Payer: Medicare Other | Source: Ambulatory Visit | Attending: Radiation Oncology | Admitting: Radiation Oncology

## 2015-02-04 DIAGNOSIS — C119 Malignant neoplasm of nasopharynx, unspecified: Secondary | ICD-10-CM | POA: Diagnosis not present

## 2015-02-05 ENCOUNTER — Ambulatory Visit
Admission: RE | Admit: 2015-02-05 | Discharge: 2015-02-05 | Disposition: A | Discharge status: Home / Self Care | Payer: Medicare Other | Source: Ambulatory Visit | Attending: Radiation Oncology | Admitting: Radiation Oncology

## 2015-02-05 ENCOUNTER — Encounter: Payer: Self-pay | Admitting: Radiation Oncology

## 2015-02-05 DIAGNOSIS — C119 Malignant neoplasm of nasopharynx, unspecified: Secondary | ICD-10-CM | POA: Diagnosis not present

## 2015-02-05 NOTE — Progress Notes (Signed)
Weekly Management Note:  Site:Nasopharynx/bilateral neck Current Dose:  6400  CGy  Projected Dose: 7000  cGy  Narrative: The patient is reviewed today for routine under treatment assessment. CBCT/MVCT images/port films were reviewed. The chart was reviewed.   No new complaints today. Dr. Alvy Bimler feels that he had molluscum contagiosum involving his extremities and abdomen. She is following this for now. He continues with fentanyl patch and MMW.  Physical Examination: There were no vitals filed for this visit..  Weight:  . Not examined today. Lab Results  Component Value Date   WBC 2.4* 01/30/2015   HGB 12.0* 01/30/2015   HCT 35.4* 01/30/2015   MCV 83.9 01/30/2015   PLT 130* 01/30/2015     Impression: Tolerating radiation therapy well. I will see Adam Yang again late Monday after he finished his XRT.   Plan: Continue radiation therapy as planned.

## 2015-02-06 ENCOUNTER — Ambulatory Visit: Payer: Medicare Other

## 2015-02-06 ENCOUNTER — Other Ambulatory Visit (HOSPITAL_BASED_OUTPATIENT_CLINIC_OR_DEPARTMENT_OTHER): Payer: Medicare Other

## 2015-02-06 ENCOUNTER — Encounter: Payer: Self-pay | Admitting: Hematology and Oncology

## 2015-02-06 ENCOUNTER — Ambulatory Visit (HOSPITAL_BASED_OUTPATIENT_CLINIC_OR_DEPARTMENT_OTHER): Payer: Medicare Other | Admitting: Hematology and Oncology

## 2015-02-06 ENCOUNTER — Telehealth: Payer: Self-pay | Admitting: Hematology and Oncology

## 2015-02-06 ENCOUNTER — Ambulatory Visit
Admission: RE | Admit: 2015-02-06 | Discharge: 2015-02-06 | Disposition: A | Discharge status: Home / Self Care | Payer: Medicare Other | Source: Ambulatory Visit | Attending: Radiation Oncology | Admitting: Radiation Oncology

## 2015-02-06 VITALS — BP 144/76 | HR 78 | Temp 98.1°F | Resp 18 | Ht 68.0 in | Wt 153.7 lb

## 2015-02-06 DIAGNOSIS — K1231 Oral mucositis (ulcerative) due to antineoplastic therapy: Secondary | ICD-10-CM

## 2015-02-06 DIAGNOSIS — E1122 Type 2 diabetes mellitus with diabetic chronic kidney disease: Secondary | ICD-10-CM

## 2015-02-06 DIAGNOSIS — N182 Chronic kidney disease, stage 2 (mild): Secondary | ICD-10-CM

## 2015-02-06 DIAGNOSIS — Z95828 Presence of other vascular implants and grafts: Secondary | ICD-10-CM

## 2015-02-06 DIAGNOSIS — T451X5A Adverse effect of antineoplastic and immunosuppressive drugs, initial encounter: Secondary | ICD-10-CM

## 2015-02-06 DIAGNOSIS — E875 Hyperkalemia: Secondary | ICD-10-CM

## 2015-02-06 DIAGNOSIS — E46 Unspecified protein-calorie malnutrition: Secondary | ICD-10-CM

## 2015-02-06 DIAGNOSIS — Z931 Gastrostomy status: Secondary | ICD-10-CM

## 2015-02-06 DIAGNOSIS — R600 Localized edema: Secondary | ICD-10-CM | POA: Diagnosis not present

## 2015-02-06 DIAGNOSIS — T402X5A Adverse effect of other opioids, initial encounter: Secondary | ICD-10-CM

## 2015-02-06 DIAGNOSIS — C119 Malignant neoplasm of nasopharynx, unspecified: Secondary | ICD-10-CM | POA: Diagnosis not present

## 2015-02-06 DIAGNOSIS — D6481 Anemia due to antineoplastic chemotherapy: Secondary | ICD-10-CM | POA: Diagnosis not present

## 2015-02-06 DIAGNOSIS — K5903 Drug induced constipation: Secondary | ICD-10-CM

## 2015-02-06 DIAGNOSIS — K5909 Other constipation: Secondary | ICD-10-CM

## 2015-02-06 DIAGNOSIS — R21 Rash and other nonspecific skin eruption: Secondary | ICD-10-CM

## 2015-02-06 DIAGNOSIS — K5900 Constipation, unspecified: Secondary | ICD-10-CM | POA: Insufficient documentation

## 2015-02-06 LAB — CBC WITH DIFFERENTIAL/PLATELET
BASO%: 0.3 % (ref 0.0–2.0)
Basophils Absolute: 0 10*3/uL (ref 0.0–0.1)
EOS ABS: 0 10*3/uL (ref 0.0–0.5)
EOS%: 0 % (ref 0.0–7.0)
HCT: 33.1 % — ABNORMAL LOW (ref 38.4–49.9)
HGB: 11.6 g/dL — ABNORMAL LOW (ref 13.0–17.1)
LYMPH%: 1.3 % — AB (ref 14.0–49.0)
MCH: 28.9 pg (ref 27.2–33.4)
MCHC: 35 g/dL (ref 32.0–36.0)
MCV: 82.5 fL (ref 79.3–98.0)
MONO#: 0.4 10*3/uL (ref 0.1–0.9)
MONO%: 9.5 % (ref 0.0–14.0)
NEUT#: 3.5 10*3/uL (ref 1.5–6.5)
NEUT%: 88.9 % — AB (ref 39.0–75.0)
Platelets: 142 10*3/uL (ref 140–400)
RBC: 4.01 10*6/uL — ABNORMAL LOW (ref 4.20–5.82)
RDW: 14.1 % (ref 11.0–14.6)
WBC: 4 10*3/uL (ref 4.0–10.3)
lymph#: 0.1 10*3/uL — ABNORMAL LOW (ref 0.9–3.3)
nRBC: 1 % — ABNORMAL HIGH (ref 0–0)

## 2015-02-06 LAB — COMPREHENSIVE METABOLIC PANEL (CC13)
ALT: 22 U/L (ref 0–55)
ANION GAP: 8 meq/L (ref 3–11)
AST: 14 U/L (ref 5–34)
Albumin: 1.8 g/dL — ABNORMAL LOW (ref 3.5–5.0)
Alkaline Phosphatase: 70 U/L (ref 40–150)
BUN: 48.2 mg/dL — AB (ref 7.0–26.0)
CO2: 28 mEq/L (ref 22–29)
Calcium: 8.5 mg/dL (ref 8.4–10.4)
Chloride: 93 mEq/L — ABNORMAL LOW (ref 98–109)
Creatinine: 1.6 mg/dL — ABNORMAL HIGH (ref 0.7–1.3)
EGFR: 45 mL/min/{1.73_m2} — ABNORMAL LOW (ref >90)
Glucose: 423 mg/dl — ABNORMAL HIGH (ref 70–140)
Potassium: 5.7 mEq/L — ABNORMAL HIGH (ref 3.5–5.1)
SODIUM: 130 meq/L — AB (ref 136–145)
TOTAL PROTEIN: 5.7 g/dL — AB (ref 6.4–8.3)
Total Bilirubin: 0.23 mg/dL (ref 0.20–1.20)

## 2015-02-06 LAB — TECHNOLOGIST REVIEW

## 2015-02-06 LAB — MAGNESIUM (CC13): Magnesium: 1.7 mg/dl (ref 1.5–2.5)

## 2015-02-06 MED ORDER — HEPARIN SOD (PORK) LOCK FLUSH 100 UNIT/ML IV SOLN
500.0000 [IU] | Freq: Once | INTRAVENOUS | Status: AC
  Administered 2015-02-06: 500 [IU] via INTRAVENOUS
  Filled 2015-02-06: qty 5

## 2015-02-06 MED ORDER — MAGIC MOUTHWASH W/LIDOCAINE: 10.0000 mL | Freq: Four times a day (QID) | ORAL | Status: DC | PRN

## 2015-02-06 MED ORDER — FENTANYL 25 MCG/HR TD PT72: 25.0000 ug | MEDICATED_PATCH | TRANSDERMAL | Status: DC

## 2015-02-06 MED ORDER — ONDANSETRON HCL 8 MG PO TABS: 8.0000 mg | ORAL_TABLET | Freq: Three times a day (TID) | ORAL | Status: DC | PRN

## 2015-02-06 MED ORDER — SODIUM CHLORIDE 0.9 % IJ SOLN
10.0000 mL | INTRAMUSCULAR | Status: DC | PRN
  Administered 2015-02-06: 10 mL via INTRAVENOUS
  Filled 2015-02-06: qty 10

## 2015-02-06 NOTE — Assessment & Plan Note (Signed)
Clinically, he is doing well apart from expected side effects. We will continue close monitoring and aggressive supportive care.

## 2015-02-06 NOTE — Assessment & Plan Note (Signed)
He is using his feeding tube well and is able to tolerate 8 cans of nutritional supplement. Per discussion, we will cancel IV fluid support this week

## 2015-02-06 NOTE — Assessment & Plan Note (Signed)
The skin rashes resemble molluscum contagiosum. This is not a contraindication to proceed with treatment. We will observe closely.

## 2015-02-06 NOTE — Telephone Encounter (Signed)
Pt confirmed MD visit per 07/28 POF, gave pt AVS and Calendar.Marland Kitchen KJ

## 2015-02-06 NOTE — Assessment & Plan Note (Signed)
Feeding tube site looks okay.  He will continue flushes.

## 2015-02-06 NOTE — Assessment & Plan Note (Signed)
This is related to high dietary intake of tomato juice and potassium rich diet. I recommend dietary modification. I do not think he needs Kayexalate. He is recommended to use regular laxatives for constipation

## 2015-02-06 NOTE — Assessment & Plan Note (Signed)
This is likely due to recent treatment. The patient denies recent history of bleeding such as epistaxis, hematuria or hematochezia. He is asymptomatic from the anemia. I will observe for now.    

## 2015-02-06 NOTE — Progress Notes (Signed)
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   12/23/2014 - 02/03/2015 Chemotherapy  he received weekly cisplatin   12/23/2014 -  Radiation Therapy  he received radiation treatment    INTERVAL HISTORY: Please see below for problem oriented charting. He returns today for his weekly supportive care visit. He feels about the same. The skin rash is not getting worse. Some of the skin rash had form blisters and others are in various stages of healing. He denies excessive skin itching. He noticed mild bilateral lower extremity edema. He has persistent mucositis pain, well controlled with fentanyl patch. He has mild constipation requiring suppository recently. He is able to tolerate significant nutritional supplement through the feeding tube along with water. He denies nausea or vomiting. He has progressive weight loss.  REVIEW OF SYSTEMS:   Constitutional: Denies fevers, chills  Eyes: Denies blurriness of vision Respiratory: Denies cough, dyspnea or wheezes Cardiovascular: Denies  palpitation, chest discomfort or lower extremity swelling Lymphatics: Denies new lymphadenopathy or easy bruising Neurological:Denies numbness, tingling or new weaknesses Behavioral/Psych: Mood is stable, no new changes  All other systems were reviewed with the patient and are negative.  I have reviewed the past medical history, past surgical history, social history and family history with the patient and they are unchanged from previous note.  ALLERGIES:  has No Known Allergies.  MEDICATIONS:  Current Outpatient Prescriptions  Medication Sig Dispense Refill  . acetaminophen (TYLENOL) 500 MG tablet Take 500-1,000 mg by mouth every 6 (six) hours as needed for mild pain.    Marland Kitchen Alum & Mag Hydroxide-Simeth (MAGIC MOUTHWASH W/LIDOCAINE) SOLN Take 10 mLs by mouth 4 (four) times daily as needed for mouth pain. 500 mL 0  . amLODipine (NORVASC) 5 MG tablet Take 5 mg by mouth every morning.     Marland Kitchen dexamethasone (DECADRON) 4 MG tablet Take 1 tablet (4 mg total) by mouth 2 (two) times daily with a meal. 60 tablet 0  . diphenhydrAMINE (BENADRYL) 25 MG tablet Take 25 mg by mouth every 6 (six) hours as needed.    Marland Kitchen emollient (BIAFINE) cream Apply 1 application topically daily.    . fentaNYL (DURAGESIC - DOSED MCG/HR) 25 MCG/HR patch Place 1 patch (25 mcg total) onto the skin every 3 (three) days. 5 patch 0  . glimepiride (AMARYL) 2 MG tablet Take 2 mg by mouth 2 (two) times daily.   4  . LORazepam (ATIVAN) 1 MG tablet Take 1 tablet (1 mg total) by mouth 2 (two) times daily as needed for anxiety. 40 tablet 0  . Multiple Vitamins-Minerals (CENTRUM SILVER PO) Take by mouth daily.    Marland Kitchen  Nutritional Supplements (FEEDING SUPPLEMENT, GLUCERNA 1.5 CAL,) LIQD Give 2 cans Glucerna 1.5 once daily via PEG and 1.5 cans TID with 150 cc free water before and after each bolus feeding. Total 6.5 cans daily-100% nutrition needs via PEG. 6.5 Can   . Omega-3 Fatty Acids (FISH OIL PO) Take by mouth daily.    . ondansetron  (ZOFRAN) 8 MG tablet Take 1 tablet (8 mg total) by mouth every 8 (eight) hours as needed. 60 tablet 1  . oxyCODONE-acetaminophen (PERCOCET) 5-325 MG per tablet Take one or two tablets by mouth every 4-6 hours as needed for pain. 50 tablet 0   No current facility-administered medications for this visit.    PHYSICAL EXAMINATION: ECOG PERFORMANCE STATUS: 1 - Symptomatic but completely ambulatory  Filed Vitals:   02/06/15 1113  BP: 144/76  Pulse: 78  Temp: 98.1 F (36.7 C)  Resp: 18   Filed Weights   02/06/15 1113  Weight: 153 lb 11.2 oz (69.718 kg)    GENERAL:alert, no distress and comfortable SKIN: He has persistent skin rash in various stages of healing. There is a mild faint rash around his neck from radiation EYES: normal, Conjunctiva are pink and non-injected, sclera clear OROPHARYNX: No significant grade 2 mucositis without thrush NECK: supple, thyroid normal size, non-tender, without nodularity LYMPH: Previously palpable lymphadenopathy are no longer detected on exam  LUNGS: clear to auscultation and percussion with normal breathing effort HEART: regular rate & rhythm and no murmurs with moderate bilateral lower extremity edema ABDOMEN:abdomen soft, non-tender and normal bowel sounds. Feeding tube site looks okay Musculoskeletal:no cyanosis of digits and no clubbing  NEURO: alert & oriented x 3 with fluent speech, no focal motor/sensory deficits  LABORATORY DATA:  I have reviewed the data as listed    Component Value Date/Time   NA 130* 02/06/2015 1043   K 5.7* 02/06/2015 1043   CO2 28 02/06/2015 1043   GLUCOSE 423* 02/06/2015 1043   BUN 48.2* 02/06/2015 1043   CREATININE 1.6* 02/06/2015 1043   CALCIUM 8.5 02/06/2015 1043   PROT 5.7* 02/06/2015 1043   ALBUMIN 1.8* 02/06/2015 1043   AST 14 02/06/2015 1043   ALT 22 02/06/2015 1043   ALKPHOS 70 02/06/2015 1043   BILITOT 0.23 02/06/2015 1043    No results found for: SPEP, UPEP  Lab Results  Component Value Date    WBC 4.0 02/06/2015   NEUTROABS 3.5 02/06/2015   HGB 11.6* 02/06/2015   HCT 33.1* 02/06/2015   MCV 82.5 02/06/2015   PLT 142 02/06/2015      Chemistry      Component Value Date/Time   NA 130* 02/06/2015 1043   K 5.7* 02/06/2015 1043   CO2 28 02/06/2015 1043   BUN 48.2* 02/06/2015 1043   CREATININE 1.6* 02/06/2015 1043      Component Value Date/Time   CALCIUM 8.5 02/06/2015 1043   ALKPHOS 70 02/06/2015 1043   AST 14 02/06/2015 1043   ALT 22 02/06/2015 1043   BILITOT 0.23 02/06/2015 1043      ASSESSMENT & PLAN:  Nasopharyngeal cancer Clinically, he is doing well apart from expected side effects. We will continue close monitoring and aggressive supportive care.  Anemia due to antineoplastic chemotherapy This is likely due to recent treatment. The patient denies recent history of bleeding such as epistaxis, hematuria or hematochezia. He is asymptomatic from the anemia. I will observe for now  Bilateral lower extremity edema This is related to low albumin state. I recommended increase physical activity as  tolerated. He does not need diuretic therapy    Hyperkalemia This is related to high dietary intake of tomato juice and potassium rich diet. I recommend dietary modification. I do not think he needs Kayexalate. He is recommended to use regular laxatives for constipation  Protein calorie malnutrition He is using his feeding tube well and is able to tolerate 8 cans of nutritional supplement. Per discussion, we will cancel IV fluid support this week  S/P gastrostomy Feeding tube site looks okay.  He will continue flushes.    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    Constipation due to opioid therapy I reinforced the importance of using regular laxatives.  Rash, skin The skin rashes resemble molluscum contagiosum. This is not a contraindication to proceed with treatment. We will observe closely.     Orders Placed This Encounter  Procedures  . CBC with Differential/Platelet    Standing Status: Future     Number of Occurrences:      Standing Expiration Date: 03/12/2016  . Comprehensive metabolic panel    Standing Status: Future     Number of Occurrences:      Standing Expiration Date: 03/12/2016  . CBC with Differential/Platelet    Standing Status: Future     Number of Occurrences:      Standing Expiration Date: 03/12/2016  . Comprehensive metabolic panel    Standing Status: Future     Number of Occurrences:      Standing Expiration Date: 03/12/2016   All questions were answered. The patient knows to call the clinic with any problems, questions or concerns. No barriers to learning was detected. I spent 30 minutes counseling the patient face to face. The total time spent in the appointment was 40 minutes and more than 50% was on counseling and review of test results     [facility], [place], MD 02/06/2015 12:19 PM

## 2015-02-06 NOTE — Assessment & Plan Note (Signed)
He has started to experience more mucositis but the current dose of fentanyl is controlling his pain well He will continue Magic mouthwash as needed.

## 2015-02-06 NOTE — Assessment & Plan Note (Signed)
This is related to low albumin state. I recommended increase physical activity as tolerated. He does not need diuretic therapy

## 2015-02-06 NOTE — Assessment & Plan Note (Signed)
I reinforced the importance of using regular laxatives.

## 2015-02-06 NOTE — Assessment & Plan Note (Signed)
He has reduced creatinine clearance which is chronic I recommend he continue aggressive oral intake as tolerated

## 2015-02-06 NOTE — Patient Instructions (Signed)

## 2015-02-07 ENCOUNTER — Ambulatory Visit: Payer: Self-pay

## 2015-02-07 ENCOUNTER — Ambulatory Visit
Admission: RE | Admit: 2015-02-07 | Discharge: 2015-02-07 | Disposition: A | Discharge status: Home / Self Care | Payer: Medicare Other | Source: Ambulatory Visit | Attending: Radiation Oncology | Admitting: Radiation Oncology

## 2015-02-07 DIAGNOSIS — C119 Malignant neoplasm of nasopharynx, unspecified: Secondary | ICD-10-CM | POA: Diagnosis not present

## 2015-02-10 ENCOUNTER — Ambulatory Visit: Payer: Medicare Other

## 2015-02-10 ENCOUNTER — Telehealth: Payer: Self-pay | Admitting: *Deleted

## 2015-02-10 ENCOUNTER — Ambulatory Visit
Admission: RE | Admit: 2015-02-10 | Discharge: 2015-02-10 | Disposition: A | Discharge status: Home / Self Care | Payer: Medicare Other | Source: Ambulatory Visit | Attending: Radiation Oncology | Admitting: Radiation Oncology

## 2015-02-10 ENCOUNTER — Encounter: Payer: Self-pay | Admitting: Radiation Oncology

## 2015-02-10 ENCOUNTER — Ambulatory Visit: Payer: Self-pay

## 2015-02-10 ENCOUNTER — Encounter: Payer: Self-pay | Admitting: Nutrition

## 2015-02-10 ENCOUNTER — Other Ambulatory Visit (HOSPITAL_BASED_OUTPATIENT_CLINIC_OR_DEPARTMENT_OTHER): Payer: Medicare Other

## 2015-02-10 DIAGNOSIS — C119 Malignant neoplasm of nasopharynx, unspecified: Secondary | ICD-10-CM

## 2015-02-10 LAB — COMPREHENSIVE METABOLIC PANEL (CC13)
ALK PHOS: 80 U/L (ref 40–150)
ALT: 22 U/L (ref 0–55)
AST: 12 U/L (ref 5–34)
Albumin: 2.2 g/dL — ABNORMAL LOW (ref 3.5–5.0)
Anion Gap: 16 mEq/L — ABNORMAL HIGH (ref 3–11)
BUN: 48.6 mg/dL — ABNORMAL HIGH (ref 7.0–26.0)
CHLORIDE: 97 meq/L — AB (ref 98–109)
CO2: 22 mEq/L (ref 22–29)
CREATININE: 1.8 mg/dL — AB (ref 0.7–1.3)
Calcium: 9.5 mg/dL (ref 8.4–10.4)
EGFR: 39 mL/min/{1.73_m2} — ABNORMAL LOW (ref >90)
Glucose: 356 mg/dl — ABNORMAL HIGH (ref 70–140)
Potassium: 4.8 mEq/L (ref 3.5–5.1)
Sodium: 135 mEq/L — ABNORMAL LOW (ref 136–145)
Total Bilirubin: 0.3 mg/dL (ref 0.20–1.20)
Total Protein: 6.6 g/dL (ref 6.4–8.3)

## 2015-02-10 LAB — CBC WITH DIFFERENTIAL/PLATELET
BASO%: 0.4 % (ref 0.0–2.0)
BASOS ABS: 0 10*3/uL (ref 0.0–0.1)
EOS%: 0 % (ref 0.0–7.0)
Eosinophils Absolute: 0 10*3/uL (ref 0.0–0.5)
HEMATOCRIT: 36.5 % — AB (ref 38.4–49.9)
HEMOGLOBIN: 12.7 g/dL — AB (ref 13.0–17.1)
LYMPH%: 5.4 % — ABNORMAL LOW (ref 14.0–49.0)
MCH: 29.2 pg (ref 27.2–33.4)
MCHC: 34.8 g/dL (ref 32.0–36.0)
MCV: 83.9 fL (ref 79.3–98.0)
MONO#: 0.8 10*3/uL (ref 0.1–0.9)
MONO%: 10.4 % (ref 0.0–14.0)
NEUT#: 6 10*3/uL (ref 1.5–6.5)
NEUT%: 83.8 % — ABNORMAL HIGH (ref 39.0–75.0)
PLATELETS: 239 10*3/uL (ref 140–400)
RBC: 4.35 10*6/uL (ref 4.20–5.82)
RDW: 14.1 % (ref 11.0–14.6)
WBC: 7.2 10*3/uL (ref 4.0–10.3)
lymph#: 0.4 10*3/uL — ABNORMAL LOW (ref 0.9–3.3)

## 2015-02-10 LAB — TECHNOLOGIST REVIEW

## 2015-02-10 NOTE — Telephone Encounter (Signed)
Informed wife of Dr. Calton Dach message below.  She verbalized understanding.

## 2015-02-10 NOTE — Progress Notes (Signed)
[facility] End of Treatment Note  Name:Adam Yang  Date: 02/10/2015 OAC:[id number] DOB:04-22-1946   Status:outpatient    CC:  Melinda Crutch, MD,  Dr. Melida Quitter, Dr. Heath Lark  REFERRING PHYSICIAN:   Dr. Melida Quitter   DIAGNOSIS:  Squamous cell carcinoma of the nasopharynx, clinical stage IV (T2 N2c M0)  INDICATION FOR TREATMENT: Curative   TREATMENT DATES: 12/23/2014 through 02/10/2015                          SITE/DOSE:   Nasopharynx/neck adenopathy 7000 cGy, high-risk lymph node tumor bed 5950 cGy, low risk lymph node tumor bed 5600 cGy all in 35 sessions with concomitant chemotherapy under the direction of Dr. Alvy Bimler                      BEAMS/ENERGY:     6 MV photons, helical IMRT Tomotherapy              NARRATIVE:   As expected, [name] a difficult time with his chemoradiation.  He became feeding tube dependent by his fourth week of therapy.  He still had residual palpable adenopathy along his left posterior neck by completion of therapy.                         PLAN: Follow-up visit with me on Tuesday, August 2 and then subsequent follow-up visits depending on his progress.

## 2015-02-10 NOTE — Telephone Encounter (Signed)
-----   Message from Heath Lark, MD sent at 02/10/2015 10:33 AM EDT ----- Regarding: labs Creatinine is a bit elevated Encourage PO intake Please add lab appt on 8/11 before he sees me ----- Message -----    From: [facility] Interface    Sent: 02/10/2015  10:01 AM      To: Heath Lark, MD

## 2015-02-10 NOTE — Progress Notes (Signed)
Tomotherapy segmentation note: The patient underwent Tomotherapy segmentation on 12/23/2014.  He was treated to 10.2 delivered field widths corresponding to one set of IMRT treatment devices.

## 2015-02-11 ENCOUNTER — Encounter: Payer: Self-pay | Admitting: Radiation Oncology

## 2015-02-11 ENCOUNTER — Ambulatory Visit
Admission: RE | Admit: 2015-02-11 | Discharge: 2015-02-11 | Disposition: A | Discharge status: Home / Self Care | Payer: Medicare Other | Source: Ambulatory Visit | Attending: Radiation Oncology | Admitting: Radiation Oncology

## 2015-02-11 VITALS — BP 133/70 | HR 84 | Temp 98.3°F | Ht 68.0 in | Wt 149.3 lb

## 2015-02-11 DIAGNOSIS — C119 Malignant neoplasm of nasopharynx, unspecified: Secondary | ICD-10-CM | POA: Insufficient documentation

## 2015-02-11 MED ORDER — BIAFINE EX EMUL
CUTANEOUS | Status: DC | PRN
  Administered 2015-02-11: 2 via TOPICAL

## 2015-02-11 NOTE — Progress Notes (Signed)
Mr. Penick completed treatment yesterday and is here for assessment with Dr. Valere Dross.  Note oral mucositis.  VSS BP 133/70 mmHg  Pulse 84  Temp(Src) 98 F (36.7 C)  Ht 5\' 8"  (1.727 m)  Wt 149 lb 4.8 oz (67.722 kg)  BMI 22.71 kg/m2  SpO2 98%.  Staying hydrated.  He has molluscum contagiosum on his legs and abdome. Skin on neck with erythema and dryness with open area on his chin.  Continues on 6 cans fo Glucerna 1.5 daily via PEG tube, followed by 371ml of H2O.  Dining water and instilling via PEG tube.  Wt Readings from Last 3 Encounters:  02/11/15 149 lb 4.8 oz (67.722 kg)  02/06/15 153 lb 11.2 oz (69.718 kg)  02/03/15 155 lb 8 oz (70.534 kg)

## 2015-02-11 NOTE — Progress Notes (Signed)
Weekly Management Note:  Site: Nasopharynx/neck Current Dose:  7000  cGy Projected Dose: 7000  cGy  Narrative: The patient is seen today for routine under treatment assessment. CBCT/MVCT images/port films were reviewed. The chart was reviewed.   Adam Yang returns today after completing his chemoradiation yesterday.  He continues with 25 g fentanyl patches for pain control, and he is only taking Tylenol when necessary breakthrough pain.  His skin lesions along his legs and abdomen are drying up although he may have a couple small lesions along his posterior left and right neck.  He is getting in 6-1/2 cans of nutritional supplementation the day is also using MiraLAX to avoid constipation.  He is using Magic mouthwash which is quite helpful.  His weight is down approximately 4 pounds over the past week.  Physical Examination:  Filed Vitals:   02/11/15 0927  BP: 133/70  Pulse: 84  Temp: 98.3 F (36.8 C)  .  Weight: 149 lb 4.8 oz (67.722 kg).  There is dry desquamation along his neck with further regression of his left posterior neck adenopathy which now measures approximately 3 cm in greatest diameter.  There is a confluent mucositis along his posterior oral cavity/oropharynx.  On inspection of his abdomen and extremities his fascicular lesions are drying up.  Impression: Satisfactory progress.  Radiation therapy completed.  I encouraged him to maintain his caloric intake and also to maintain or improve his water intake. Plan: Follow-up visit with me in 2 weeks.

## 2015-02-11 NOTE — Addendum Note (Signed)
Encounter addended by: Benn Moulder, RN on: 02/11/2015 10:25 AM<BR>     Documentation filed: Inpatient MAR

## 2015-02-14 ENCOUNTER — Telehealth: Payer: Self-pay | Admitting: *Deleted

## 2015-02-14 ENCOUNTER — Other Ambulatory Visit: Payer: Self-pay | Admitting: Hematology and Oncology

## 2015-02-14 ENCOUNTER — Encounter (HOSPITAL_COMMUNITY): Payer: Self-pay

## 2015-02-14 ENCOUNTER — Inpatient Hospital Stay (HOSPITAL_COMMUNITY)
Admission: AD | Admit: 2015-02-14 | Discharge: 2015-02-16 | DRG: 641 | Disposition: A | Discharge status: Home / Self Care | Payer: Medicare Other | Source: Ambulatory Visit | Attending: Internal Medicine | Admitting: Internal Medicine

## 2015-02-14 ENCOUNTER — Ambulatory Visit (HOSPITAL_BASED_OUTPATIENT_CLINIC_OR_DEPARTMENT_OTHER): Payer: Medicare Other | Admitting: Hematology and Oncology

## 2015-02-14 ENCOUNTER — Inpatient Hospital Stay (HOSPITAL_COMMUNITY): Payer: Medicare Other

## 2015-02-14 ENCOUNTER — Other Ambulatory Visit: Payer: Self-pay

## 2015-02-14 ENCOUNTER — Encounter: Payer: Self-pay | Admitting: Hematology and Oncology

## 2015-02-14 ENCOUNTER — Ambulatory Visit (HOSPITAL_BASED_OUTPATIENT_CLINIC_OR_DEPARTMENT_OTHER): Payer: Medicare Other

## 2015-02-14 VITALS — BP 140/76 | HR 105 | Temp 98.3°F | Resp 18 | Ht 68.0 in | Wt 147.3 lb

## 2015-02-14 DIAGNOSIS — N182 Chronic kidney disease, stage 2 (mild): Secondary | ICD-10-CM

## 2015-02-14 DIAGNOSIS — C119 Malignant neoplasm of nasopharynx, unspecified: Secondary | ICD-10-CM | POA: Diagnosis not present

## 2015-02-14 DIAGNOSIS — E46 Unspecified protein-calorie malnutrition: Secondary | ICD-10-CM | POA: Diagnosis not present

## 2015-02-14 DIAGNOSIS — E1122 Type 2 diabetes mellitus with diabetic chronic kidney disease: Secondary | ICD-10-CM | POA: Diagnosis not present

## 2015-02-14 DIAGNOSIS — E1165 Type 2 diabetes mellitus with hyperglycemia: Secondary | ICD-10-CM

## 2015-02-14 DIAGNOSIS — K1231 Oral mucositis (ulcerative) due to antineoplastic therapy: Secondary | ICD-10-CM | POA: Diagnosis present

## 2015-02-14 DIAGNOSIS — E875 Hyperkalemia: Secondary | ICD-10-CM

## 2015-02-14 DIAGNOSIS — E871 Hypo-osmolality and hyponatremia: Secondary | ICD-10-CM | POA: Diagnosis present

## 2015-02-14 DIAGNOSIS — M1990 Unspecified osteoarthritis, unspecified site: Secondary | ICD-10-CM | POA: Diagnosis present

## 2015-02-14 DIAGNOSIS — M542 Cervicalgia: Secondary | ICD-10-CM

## 2015-02-14 DIAGNOSIS — I129 Hypertensive chronic kidney disease with stage 1 through stage 4 chronic kidney disease, or unspecified chronic kidney disease: Secondary | ICD-10-CM | POA: Diagnosis present

## 2015-02-14 DIAGNOSIS — T451X5A Adverse effect of antineoplastic and immunosuppressive drugs, initial encounter: Secondary | ICD-10-CM | POA: Diagnosis present

## 2015-02-14 DIAGNOSIS — R6884 Jaw pain: Secondary | ICD-10-CM

## 2015-02-14 DIAGNOSIS — E440 Moderate protein-calorie malnutrition: Secondary | ICD-10-CM | POA: Diagnosis not present

## 2015-02-14 DIAGNOSIS — K1120 Sialoadenitis, unspecified: Secondary | ICD-10-CM | POA: Insufficient documentation

## 2015-02-14 DIAGNOSIS — F419 Anxiety disorder, unspecified: Secondary | ICD-10-CM | POA: Diagnosis present

## 2015-02-14 DIAGNOSIS — Z7952 Long term (current) use of systemic steroids: Secondary | ICD-10-CM

## 2015-02-14 DIAGNOSIS — Z801 Family history of malignant neoplasm of trachea, bronchus and lung: Secondary | ICD-10-CM

## 2015-02-14 DIAGNOSIS — Z931 Gastrostomy status: Secondary | ICD-10-CM

## 2015-02-14 DIAGNOSIS — R221 Localized swelling, mass and lump, neck: Secondary | ICD-10-CM

## 2015-02-14 DIAGNOSIS — F40240 Claustrophobia: Secondary | ICD-10-CM | POA: Diagnosis present

## 2015-02-14 DIAGNOSIS — R609 Edema, unspecified: Secondary | ICD-10-CM

## 2015-02-14 DIAGNOSIS — N179 Acute kidney failure, unspecified: Secondary | ICD-10-CM | POA: Diagnosis present

## 2015-02-14 DIAGNOSIS — D6481 Anemia due to antineoplastic chemotherapy: Secondary | ICD-10-CM

## 2015-02-14 DIAGNOSIS — Z79891 Long term (current) use of opiate analgesic: Secondary | ICD-10-CM

## 2015-02-14 DIAGNOSIS — Z6822 Body mass index (BMI) 22.0-22.9, adult: Secondary | ICD-10-CM

## 2015-02-14 DIAGNOSIS — Z79899 Other long term (current) drug therapy: Secondary | ICD-10-CM

## 2015-02-14 DIAGNOSIS — R600 Localized edema: Secondary | ICD-10-CM

## 2015-02-14 DIAGNOSIS — M109 Gout, unspecified: Secondary | ICD-10-CM | POA: Diagnosis present

## 2015-02-14 DIAGNOSIS — Z808 Family history of malignant neoplasm of other organs or systems: Secondary | ICD-10-CM

## 2015-02-14 DIAGNOSIS — K1121 Acute sialoadenitis: Secondary | ICD-10-CM | POA: Diagnosis present

## 2015-02-14 DIAGNOSIS — E785 Hyperlipidemia, unspecified: Secondary | ICD-10-CM | POA: Diagnosis present

## 2015-02-14 DIAGNOSIS — Z85819 Personal history of malignant neoplasm of unspecified site of lip, oral cavity, and pharynx: Secondary | ICD-10-CM | POA: Diagnosis present

## 2015-02-14 DIAGNOSIS — Z87891 Personal history of nicotine dependence: Secondary | ICD-10-CM

## 2015-02-14 LAB — COMPREHENSIVE METABOLIC PANEL (CC13)
ALK PHOS: 94 U/L (ref 40–150)
ALT: 26 U/L (ref 0–55)
AST: 13 U/L (ref 5–34)
Albumin: 2.2 g/dL — ABNORMAL LOW (ref 3.5–5.0)
Anion Gap: 12 mEq/L — ABNORMAL HIGH (ref 3–11)
BUN: 58.8 mg/dL — ABNORMAL HIGH (ref 7.0–26.0)
CO2: 27 mEq/L (ref 22–29)
CREATININE: 1.8 mg/dL — AB (ref 0.7–1.3)
Calcium: 9.3 mg/dL (ref 8.4–10.4)
Chloride: 89 mEq/L — ABNORMAL LOW (ref 98–109)
EGFR: 38 mL/min/{1.73_m2} — AB (ref >90)
GLUCOSE: 598 mg/dL — AB (ref 70–140)
POTASSIUM: 6.7 meq/L — AB (ref 3.5–5.1)
SODIUM: 128 meq/L — AB (ref 136–145)
Total Bilirubin: 0.37 mg/dL (ref 0.20–1.20)
Total Protein: 6.5 g/dL (ref 6.4–8.3)

## 2015-02-14 LAB — CBC
HEMATOCRIT: 33.9 % — AB (ref 39.0–52.0)
Hemoglobin: 11.7 g/dL — ABNORMAL LOW (ref 13.0–17.0)
MCH: 28.9 pg (ref 26.0–34.0)
MCHC: 34.5 g/dL (ref 30.0–36.0)
MCV: 83.7 fL (ref 78.0–100.0)
Platelets: 187 10*3/uL (ref 150–400)
RBC: 4.05 MIL/uL — ABNORMAL LOW (ref 4.22–5.81)
RDW: 14.5 % (ref 11.5–15.5)
WBC: 7.9 10*3/uL (ref 4.0–10.5)

## 2015-02-14 LAB — TECHNOLOGIST REVIEW

## 2015-02-14 LAB — CBC WITH DIFFERENTIAL/PLATELET
BASO%: 0.8 % (ref 0.0–2.0)
Basophils Absolute: 0.1 10*3/uL (ref 0.0–0.1)
EOS ABS: 0 10*3/uL (ref 0.0–0.5)
EOS%: 0 % (ref 0.0–7.0)
HCT: 36.9 % — ABNORMAL LOW (ref 38.4–49.9)
HGB: 12.5 g/dL — ABNORMAL LOW (ref 13.0–17.1)
LYMPH%: 1.4 % — ABNORMAL LOW (ref 14.0–49.0)
MCH: 28.9 pg (ref 27.2–33.4)
MCHC: 33.7 g/dL (ref 32.0–36.0)
MCV: 85.8 fL (ref 79.3–98.0)
MONO#: 0.6 10*3/uL (ref 0.1–0.9)
MONO%: 5.4 % (ref 0.0–14.0)
NEUT%: 92.4 % — ABNORMAL HIGH (ref 39.0–75.0)
NEUTROS ABS: 9.6 10*3/uL — AB (ref 1.5–6.5)
Platelets: 268 10*3/uL (ref 140–400)
RBC: 4.3 10*6/uL (ref 4.20–5.82)
RDW: 14.4 % (ref 11.0–14.6)
WBC: 10.4 10*3/uL — AB (ref 4.0–10.3)
lymph#: 0.2 10*3/uL — ABNORMAL LOW (ref 0.9–3.3)

## 2015-02-14 LAB — CREATININE, SERUM
Creatinine, Ser: 1.67 mg/dL — ABNORMAL HIGH (ref 0.61–1.24)
GFR calc Af Amer: 47 mL/min — ABNORMAL LOW (ref >60)
GFR, EST NON AFRICAN AMERICAN: 41 mL/min — AB (ref >60)

## 2015-02-14 LAB — GLUCOSE, CAPILLARY
Glucose-Capillary: 499 mg/dL — ABNORMAL HIGH (ref 65–99)
Glucose-Capillary: 282 mg/dL — ABNORMAL HIGH (ref 65–99)
Glucose-Capillary: 211 mg/dL — ABNORMAL HIGH (ref 65–99)
Glucose-Capillary: 120 mg/dL — ABNORMAL HIGH (ref 65–99)
Glucose-Capillary: 106 mg/dL — ABNORMAL HIGH (ref 65–99)

## 2015-02-14 MED ORDER — AMLODIPINE BESYLATE 5 MG PO TABS
5.0000 mg | ORAL_TABLET | Freq: Every morning | ORAL | Status: DC
  Administered 2015-02-15 – 2015-02-16 (×2): 5 mg
  Filled 2015-02-14 (×2): qty 1

## 2015-02-14 MED ORDER — MAGIC MOUTHWASH W/LIDOCAINE
10.0000 mL | Freq: Four times a day (QID) | ORAL | Status: DC | PRN
  Administered 2015-02-14 (×2): 10 mL via ORAL
  Filled 2015-02-14 (×3): qty 10

## 2015-02-14 MED ORDER — PIPERACILLIN-TAZOBACTAM 3.375 G IVPB
3.3750 g | Freq: Once | INTRAVENOUS | Status: AC
  Administered 2015-02-14: 3.375 g via INTRAVENOUS
  Filled 2015-02-14 (×2): qty 50

## 2015-02-14 MED ORDER — AMLODIPINE BESYLATE 5 MG PO TABS: 5.0000 mg | ORAL_TABLET | Freq: Every morning | ORAL | Status: DC

## 2015-02-14 MED ORDER — OXYCODONE-ACETAMINOPHEN 5-325 MG PO TABS: 1.0000 | ORAL_TABLET | ORAL | Status: DC | PRN

## 2015-02-14 MED ORDER — SODIUM CHLORIDE 0.9 % IJ SOLN
3.0000 mL | Freq: Two times a day (BID) | INTRAMUSCULAR | Status: DC
  Administered 2015-02-15 (×2): 3 mL via INTRAVENOUS

## 2015-02-14 MED ORDER — DEXAMETHASONE 4 MG PO TABS
4.0000 mg | ORAL_TABLET | Freq: Two times a day (BID) | ORAL | Status: DC
  Administered 2015-02-15 – 2015-02-16 (×2): 4 mg
  Filled 2015-02-14: qty 2
  Filled 2015-02-14 (×3): qty 1
  Filled 2015-02-14: qty 2

## 2015-02-14 MED ORDER — DEXAMETHASONE 2 MG PO TABS: 4.0000 mg | ORAL_TABLET | Freq: Two times a day (BID) | ORAL | Status: DC

## 2015-02-14 MED ORDER — INSULIN ASPART 100 UNIT/ML ~~LOC~~ SOLN
5.0000 [IU] | Freq: Once | SUBCUTANEOUS | Status: AC
  Administered 2015-02-14: 5 [IU] via INTRAVENOUS
  Filled 2015-02-14 (×2): qty 0.05

## 2015-02-14 MED ORDER — ALBUTEROL SULFATE (2.5 MG/3ML) 0.083% IN NEBU: 10.0000 mg | INHALATION_SOLUTION | Freq: Once | RESPIRATORY_TRACT | Status: DC

## 2015-02-14 MED ORDER — AMOXICILLIN-POT CLAVULANATE 875-125 MG PO TABS: 1.0000 | ORAL_TABLET | Freq: Two times a day (BID) | ORAL | Status: DC

## 2015-02-14 MED ORDER — ACETAMINOPHEN 500 MG PO TABS: 500.0000 mg | ORAL_TABLET | Freq: Four times a day (QID) | ORAL | Status: DC | PRN

## 2015-02-14 MED ORDER — SODIUM POLYSTYRENE SULFONATE 15 GM/60ML PO SUSP
30.0000 g | Freq: Once | ORAL | Status: AC
  Administered 2015-02-14: 30 g via ORAL
  Filled 2015-02-14: qty 120

## 2015-02-14 MED ORDER — POLYETHYLENE GLYCOL 3350 17 G PO PACK: 17.0000 g | PACK | Freq: Every day | ORAL | Status: DC | PRN

## 2015-02-14 MED ORDER — LORAZEPAM 1 MG PO TABS: 1.0000 mg | ORAL_TABLET | Freq: Two times a day (BID) | ORAL | Status: DC | PRN

## 2015-02-14 MED ORDER — SODIUM CHLORIDE 0.9 % IV BOLUS (SEPSIS)
1000.0000 mL | Freq: Once | INTRAVENOUS | Status: AC
Start: 2015-02-14 — End: 2015-02-14
  Administered 2015-02-14: 1000 mL via INTRAVENOUS

## 2015-02-14 MED ORDER — ONDANSETRON HCL 4 MG PO TABS: 8.0000 mg | ORAL_TABLET | Freq: Three times a day (TID) | ORAL | Status: DC | PRN

## 2015-02-14 MED ORDER — SODIUM CHLORIDE 0.9 % IV BOLUS (SEPSIS)
1000.0000 mL | INTRAVENOUS | Status: AC
  Administered 2015-02-14: 1000 mL via INTRAVENOUS

## 2015-02-14 MED ORDER — INSULIN REGULAR BOLUS VIA INFUSION
0.0000 [IU] | Freq: Three times a day (TID) | INTRAVENOUS | Status: DC
  Filled 2015-02-14: qty 10

## 2015-02-14 MED ORDER — OMEGA-3-ACID ETHYL ESTERS 1 G PO CAPS: 1.0000 g | ORAL_CAPSULE | Freq: Every day | ORAL | Status: DC

## 2015-02-14 MED ORDER — HYDROCODONE-ACETAMINOPHEN 5-325 MG PO TABS
1.0000 | ORAL_TABLET | ORAL | Status: DC | PRN
  Administered 2015-02-14: 2 via ORAL
  Filled 2015-02-14: qty 2

## 2015-02-14 MED ORDER — DEXTROSE 50 % IV SOLN: 1.0000 | Freq: Once | INTRAVENOUS | Status: DC

## 2015-02-14 MED ORDER — OMEGA-3-ACID ETHYL ESTERS 1 G PO CAPS
1.0000 g | ORAL_CAPSULE | Freq: Every day | ORAL | Status: DC
  Administered 2015-02-15 – 2015-02-16 (×2): 1 g
  Filled 2015-02-14 (×2): qty 1

## 2015-02-14 MED ORDER — BISACODYL 10 MG RE SUPP: 10.0000 mg | Freq: Every day | RECTAL | Status: DC | PRN

## 2015-02-14 MED ORDER — ONDANSETRON HCL 4 MG/2ML IJ SOLN: 4.0000 mg | Freq: Four times a day (QID) | INTRAMUSCULAR | Status: DC | PRN

## 2015-02-14 MED ORDER — DEXTROSE 50 % IV SOLN: 25.0000 mL | INTRAVENOUS | Status: DC | PRN

## 2015-02-14 MED ORDER — PIPERACILLIN-TAZOBACTAM 3.375 G IVPB
3.3750 g | Freq: Three times a day (TID) | INTRAVENOUS | Status: DC
  Administered 2015-02-15 – 2015-02-16 (×5): 3.375 g via INTRAVENOUS
  Filled 2015-02-14 (×6): qty 50

## 2015-02-14 MED ORDER — FENTANYL 25 MCG/HR TD PT72
25.0000 ug | MEDICATED_PATCH | TRANSDERMAL | Status: DC
  Administered 2015-02-14: 25 ug via TRANSDERMAL
  Filled 2015-02-14: qty 1

## 2015-02-14 MED ORDER — OXYCODONE-ACETAMINOPHEN 5-325 MG PO TABS
1.0000 | ORAL_TABLET | ORAL | Status: DC | PRN
  Administered 2015-02-14 – 2015-02-15 (×2): 1
  Filled 2015-02-14 (×3): qty 1

## 2015-02-14 MED ORDER — SODIUM BICARBONATE 8.4 % IV SOLN
50.0000 meq | Freq: Once | INTRAVENOUS | Status: AC
  Administered 2015-02-14: 50 meq via INTRAVENOUS
  Filled 2015-02-14: qty 50

## 2015-02-14 MED ORDER — ALBUTEROL SULFATE (2.5 MG/3ML) 0.083% IN NEBU: 2.5000 mg | INHALATION_SOLUTION | RESPIRATORY_TRACT | Status: DC | PRN

## 2015-02-14 MED ORDER — HEPARIN SODIUM (PORCINE) 5000 UNIT/ML IJ SOLN
5000.0000 [IU] | Freq: Three times a day (TID) | INTRAMUSCULAR | Status: DC
  Administered 2015-02-14 – 2015-02-16 (×5): 5000 [IU] via SUBCUTANEOUS
  Filled 2015-02-14 (×8): qty 1

## 2015-02-14 MED ORDER — ONDANSETRON HCL 4 MG PO TABS
4.0000 mg | ORAL_TABLET | Freq: Four times a day (QID) | ORAL | Status: DC | PRN
  Administered 2015-02-15: 4 mg
  Filled 2015-02-14: qty 1

## 2015-02-14 MED ORDER — SODIUM CHLORIDE 0.9 % IV SOLN
INTRAVENOUS | Status: DC
  Administered 2015-02-14: 4.4 [IU]/h via INTRAVENOUS
  Filled 2015-02-14: qty 2.5

## 2015-02-14 MED ORDER — LIDOCAINE-PRILOCAINE 2.5-2.5 % EX CREA
TOPICAL_CREAM | Freq: Once | CUTANEOUS | Status: AC
  Administered 2015-02-14: 16:00:00 via TOPICAL
  Filled 2015-02-14: qty 5

## 2015-02-14 MED ORDER — ACETAMINOPHEN 325 MG PO TABS: 650.0000 mg | ORAL_TABLET | Freq: Four times a day (QID) | ORAL | Status: DC | PRN

## 2015-02-14 MED ORDER — DEXTROSE 50 % IV SOLN: 1.0000 | Freq: Once | INTRAVENOUS | Status: DC | PRN

## 2015-02-14 MED ORDER — FENTANYL 25 MCG/HR TD PT72: 25.0000 ug | MEDICATED_PATCH | TRANSDERMAL | Status: DC

## 2015-02-14 MED ORDER — ONDANSETRON HCL 4 MG PO TABS: 4.0000 mg | ORAL_TABLET | Freq: Four times a day (QID) | ORAL | Status: DC | PRN

## 2015-02-14 MED ORDER — DEXTROSE-NACL 5-0.45 % IV SOLN
INTRAVENOUS | Status: DC
  Administered 2015-02-14: 20:00:00 via INTRAVENOUS

## 2015-02-14 MED ORDER — SODIUM CHLORIDE 0.9 % IV SOLN: INTRAVENOUS | Status: DC

## 2015-02-14 MED ORDER — SODIUM CHLORIDE 0.9 % IV SOLN
INTRAVENOUS | Status: DC
Start: 2015-02-14 — End: 2015-02-15
  Administered 2015-02-14: 18:00:00 via INTRAVENOUS

## 2015-02-14 MED ORDER — CENTRUM SILVER PO TABS: ORAL_TABLET | Freq: Every day | ORAL | Status: DC

## 2015-02-14 MED ORDER — ACETAMINOPHEN 650 MG RE SUPP: 650.0000 mg | Freq: Four times a day (QID) | RECTAL | Status: DC | PRN

## 2015-02-14 NOTE — Assessment & Plan Note (Addendum)
The patient has completed all his treatment. The acute swelling on the right neck is likely due to acute cellulitis. Imaging study would be recommended that given his gross blood work abnormalities, he needs to be admitted to the hospital. Once his kidney function and other blood work stabilized, I will recommend CT scan of the neck for evaluation. The patient would benefit from coverage with broad-spectrum IV anti-biotics.

## 2015-02-14 NOTE — Progress Notes (Signed)
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   12/23/2014 - 02/03/2015 Chemotherapy  he received weekly cisplatin   12/23/2014 -  Radiation Therapy  he received radiation treatment    INTERVAL HISTORY: Please see below for problem oriented charting. He is seen urgently today because of acute onset of right neck pain, swelling, jaw pain and intermittent confusion at home. His pain is rated as 9 out of 10 pain. He claimed that he was able to drink enough liquids. He denies nausea or vomiting. He has intermittent mild rash on his legs which is suspect is steroid-induced. He has been taking 4 mg of dexamethasone twice a day this week for neck swelling. He has mild chronic bilateral lower extremity edema  REVIEW OF SYSTEMS:   Constitutional: Denies fevers, chills or abnormal weight loss Eyes: Denies blurriness of vision Respiratory: Denies cough, dyspnea or wheezes Cardiovascular: Denies palpitation, chest discomfort Gastrointestinal:  Denies nausea, heartburn or change in bowel  habits Lymphatics: Denies new lymphadenopathy or easy bruising Neurological:Denies numbness, tingling or new weaknesses Behavioral/Psych: Mood is stable, no new changes  All other systems were reviewed with the patient and are negative.  I have reviewed the past medical history, past surgical history, social history and family history with the patient and they are unchanged from previous note.  ALLERGIES:  has No Known Allergies.  MEDICATIONS:  Current Outpatient Prescriptions  Medication Sig Dispense Refill  . acetaminophen (TYLENOL) 500 MG tablet Take 500-1,000 mg by mouth every 6 (six) hours as needed for mild pain.    Marland Kitchen Alum & Mag Hydroxide-Simeth (MAGIC MOUTHWASH W/LIDOCAINE) SOLN Take 10 mLs by mouth 4 (four) times daily as needed for mouth pain. 500 mL 0  . amLODipine (NORVASC) 5 MG tablet Take 5 mg by mouth every morning.     Marland Kitchen amoxicillin-clavulanate (AUGMENTIN) 875-125 MG per tablet Take 1 tablet by mouth 2 (two) times daily. 14 tablet 0  . dexamethasone (DECADRON) 4 MG tablet Take 1 tablet (4 mg total) by mouth 2 (two) times daily with a meal. 60 tablet 0  . diphenhydrAMINE (BENADRYL) 25 MG tablet Take 25 mg by mouth every 6 (six) hours as needed.    Marland Kitchen emollient (BIAFINE) cream Apply 1 application topically daily.    . fentaNYL (DURAGESIC - DOSED MCG/HR) 25 MCG/HR patch Place 1 patch (25 mcg total) onto the skin every 3 (three) days. 5 patch 0  . glimepiride (AMARYL) 2 MG tablet Take 2 mg by mouth 2 (two) times daily.   4  . LORazepam (ATIVAN) 1 MG tablet Take 1 tablet (1 mg total) by mouth 2 (two)  times daily as needed for anxiety. 40 tablet 0  . Multiple Vitamins-Minerals (CENTRUM SILVER PO) Take by mouth daily.    . Nutritional Supplements (FEEDING SUPPLEMENT, GLUCERNA 1.5 CAL,) LIQD Give 2 cans Glucerna 1.5 once daily via PEG and 1.5 cans TID with 150 cc free water before and after each bolus feeding. Total 6.5 cans daily-100% nutrition needs via PEG. 6.5 Can   . Omega-3 Fatty  Acids (FISH OIL PO) Take by mouth daily.    . ondansetron (ZOFRAN) 8 MG tablet Take 1 tablet (8 mg total) by mouth every 8 (eight) hours as needed. 60 tablet 1  . oxyCODONE-acetaminophen (PERCOCET) 5-325 MG per tablet Take one or two tablets by mouth every 4-6 hours as needed for pain. 50 tablet 0   No current facility-administered medications for this visit.    PHYSICAL EXAMINATION: ECOG PERFORMANCE STATUS: 3 - Symptomatic, >50% confined to bed  Filed Vitals:   02/14/15 1337  BP: 140/76  Pulse: 105  Temp: 98.3 F (36.8 C)  Resp: 18   Filed Weights   02/14/15 1337  Weight: 147 lb 4.8 oz (66.815 kg)    GENERAL:alert, no distress and comfortable. He looked in distress from pain SKIN: He had very mild skin rash and his body which resemble acneform rash.  EYES: normal, Conjunctiva are pink and non-injected, sclera clear OROPHARYNX: He has persistent ulcerated mucositis and is mild without thrush NECK: The right side of the neck is acutely swollen, compared to prior visit with significant swelling and tenderness near the parotid/submandibular region  LYMPH:  Swelling in right side of the neck and persistent lymphadenopathy in the left of the neck, unchanged LUNGS: clear to auscultation and percussion with normal breathing effort HEART: regular rate & rhythm and no murmurs and no lower extremity edema ABDOMEN:abdomen soft, non-tender and normal bowel sounds. Feeding tube site looks okay Musculoskeletal:no cyanosis of digits and no clubbing  NEURO: alert & oriented x 3 with fluent speech, no focal motor/sensory deficits  LABORATORY DATA:  I have reviewed the data as listed    Component Value Date/Time   NA 128* 02/14/2015 1329   K 6.7* 02/14/2015 1329   CO2 27 02/14/2015 1329   GLUCOSE 598* 02/14/2015 1329   BUN 58.8* 02/14/2015 1329   CREATININE 1.8* 02/14/2015 1329   CALCIUM 9.3 02/14/2015 1329   PROT 6.5 02/14/2015 1329   ALBUMIN 2.2* 02/14/2015 1329   AST 13 02/14/2015  1329   ALT 26 02/14/2015 1329   ALKPHOS 94 02/14/2015 1329   BILITOT 0.37 02/14/2015 1329    No results found for: SPEP, UPEP  Lab Results  Component Value Date   WBC 10.4* 02/14/2015   NEUTROABS 9.6* 02/14/2015   HGB 12.5* 02/14/2015   HCT 36.9* 02/14/2015   MCV 85.8 02/14/2015   PLT 268 02/14/2015      Chemistry      Component Value Date/Time   NA 128* 02/14/2015 1329   K 6.7* 02/14/2015 1329   CO2 27 02/14/2015 1329   BUN 58.8* 02/14/2015 1329   CREATININE 1.8* 02/14/2015 1329      Component Value Date/Time   CALCIUM 9.3 02/14/2015 1329   ALKPHOS 94 02/14/2015 1329   AST 13 02/14/2015 1329   ALT 26 02/14/2015 1329   BILITOT 0.37 02/14/2015 1329      ASSESSMENT & PLAN:  Nasopharyngeal cancer The patient has completed all his treatment. The acute swelling on the right neck is likely due to acute cellulitis. Imaging study would be  recommended that given his gross blood work abnormalities, he needs to be admitted to the hospital. Once his kidney function and other blood work stabilized, I will recommend CT scan of the neck for evaluation. The patient would benefit from coverage with broad-spectrum IV anti-biotics.  Anemia due to antineoplastic chemotherapy This is likely due to recent treatment. The patient denies recent history of bleeding such as epistaxis, hematuria or hematochezia. He is asymptomatic from the anemia. I will observe for now    Hyperkalemia His potassium level is dangerously high. He needs urgent admission. I spoke with the hospitalist who accepted him directly to the stepdown unit for management of severe hypokalemia. I suspect this could be related to renal failure.  Stage 2 chronic renal impairment associated with type 2 diabetes mellitus The patient had acute renal failure with worsening hyperkalemia and hyponatremia. He needs urgent admission and management for this. He would need aggressive IV fluid resuscitation. I spoke with the  hospitalist who has accepted his urgent admission to the stepdown unit for further management.  Hyperglycemia due to type 2 diabetes mellitus He has severe hyperglycemia due to infection. He will need insulin drip to manage this. The patient will be admitted to the hospital for this.  Salivary gland infection He has severe acute right jaw pain and swelling on the right side of the neck which I suspect is due to salivary gland infection. I recommend broad-spectrum IV anti-biotics, pain medicartion and CT imaging study to exclude abscess.   No orders of the defined types were placed in this encounter.   All questions were answered. The patient knows to call the clinic with any problems, questions or concerns. No barriers to learning was detected. I spent 30 minutes counseling the patient face to face. The total time spent in the appointment was 40 minutes and more than 50% was on counseling and review of test results     [facility], [place], MD 02/14/2015 2:56 PM

## 2015-02-14 NOTE — Assessment & Plan Note (Signed)
He has severe hyperglycemia due to infection. He will need insulin drip to manage this. The patient will be admitted to the hospital for this.

## 2015-02-14 NOTE — Assessment & Plan Note (Signed)
The patient had acute renal failure with worsening hyperkalemia and hyponatremia. He needs urgent admission and management for this. He would need aggressive IV fluid resuscitation. I spoke with the hospitalist who has accepted his urgent admission to the stepdown unit for further management.

## 2015-02-14 NOTE — Assessment & Plan Note (Signed)
His potassium level is dangerously high. He needs urgent admission. I spoke with the hospitalist who accepted him directly to the stepdown unit for management of severe hypokalemia. I suspect this could be related to renal failure.

## 2015-02-14 NOTE — Progress Notes (Signed)
ANTIBIOTIC CONSULT NOTE - INITIAL  Pharmacy Consult for Zosyn Indication: Salivary gland infection  No Known Allergies  Patient Measurements: Height: 5\' 8"  (172.7 cm) Weight: 148 lb 2.4 oz (67.2 kg) IBW/kg (Calculated) : 68.4  Vital Signs: Temp: 98.2 F (36.8 C) (08/05 1600) Temp Source: Oral (08/05 1600) BP: 153/81 mmHg (08/05 1600) Pulse Rate: 83 (08/05 1600) Intake/Output from previous day:    Labs:  Recent Labs  02/14/15 1329 02/14/15 1329  WBC 10.4*  --   HGB 12.5*  --   PLT 268  --   CREATININE  --  1.8*   Estimated Creatinine Clearance: 37.9 mL/min (by C-G formula based on Cr of 1.8). No results for input(s): VANCOTROUGH, VANCOPEAK, VANCORANDOM, GENTTROUGH, GENTPEAK, GENTRANDOM, TOBRATROUGH, TOBRAPEAK, TOBRARND, AMIKACINPEAK, AMIKACINTROU, AMIKACIN in the last 72 hours.   Microbiology: Recent Results (from the past 720 hour(s))  TECHNOLOGIST REVIEW     Status: None   Collection Time: 01/30/15  8:04 AM  Result Value Ref Range Status   Technologist Review Occ Metas and Myelocytes present  Final  TECHNOLOGIST REVIEW     Status: None   Collection Time: 02/06/15 10:43 AM  Result Value Ref Range Status   Technologist Review few Metas and Myelocytes present  Final  TECHNOLOGIST REVIEW     Status: None   Collection Time: 02/10/15  9:52 AM  Result Value Ref Range Status   Technologist Review Metas and Myelocytes present  Final  TECHNOLOGIST REVIEW     Status: None   Collection Time: 02/14/15  1:29 PM  Result Value Ref Range Status   Technologist Review Metas and Myelocytes present  Final    Medical History: Past Medical History  Diagnosis Date  . Hyperlipidemia   . Hypertension   . Complication of anesthesia   . PONV (postoperative nausea and vomiting)   . Diabetes mellitus without complication     Type II  . Chronic kidney disease     Renal Insuffiency , Creatinine has been in the 2- 3 range, 11/27/14 Creatine 1.4  . Gout   . Arthritis   . Anxiety     Claustrophobia  . Cancer     Medications:  Anti-infectives    Start     Dose/Rate Route Frequency Ordered Stop   02/14/15 2200  amoxicillin-clavulanate (AUGMENTIN) 875-125 MG per tablet 1 tablet  Status:  Discontinued     1 tablet Oral 2 times daily 02/14/15 1608 02/14/15 1629   02/14/15 1645  piperacillin-tazobactam (ZOSYN) IVPB 3.375 g     3.375 g 12.5 mL/hr over 240 Minutes Intravenous  Once 02/14/15 1631       Assessment: 8 yoM directly admitted from [facility] with cellulitis and salivary gland infection, along with hyperkalemia, hyperglycemia, and asymptomatic anemia.  PMH includes CKD II, nasopharyngeal cancer s/p treatment (completed chemotherapy on 02/03/15).  CT to r/o abscess is pending.  Pharmacy is consulted to dose Zosyn.  8/5 >> Zosyn >>  Today, 02/14/2015: Tm 98.2 WBC 10.4 (chronic decadron) SCr 1.8, CrCl ~ 38 ml/min  Goal of Therapy:  Appropriate abx dosing, eradication of infection.   Plan:   Zosyn 3.375g IV Q8H infused over 4hrs.  Follow up renal fxn, culture results, and clinical course.   Gretta Arab PharmD, BCPS Pager [phone] 02/14/2015 4:36 PM

## 2015-02-14 NOTE — H&P (Addendum)
Patient Demographics  Adam Yang, is a 69 y.o. male  MRN: [id number]   DOB - [birth year]  Admit Date - 02/14/2015  Outpatient Primary MD for the patient is  Melinda Crutch, MD   With History of -  Past Medical History  Diagnosis Date  . Hyperlipidemia   . Hypertension   . Complication of anesthesia   . PONV (postoperative nausea and vomiting)   . Diabetes mellitus without complication     Type II  . Chronic kidney disease     Renal Insuffiency , Creatinine has been in the 2- 3 range, 11/27/14 Creatine 1.4  . Gout   . Arthritis   . Anxiety     Claustrophobia  . Cancer       Past Surgical History  Procedure Laterality Date  . Lymph node biopsy    . Colonoscopy    . Knee surgery Left 1990's    tibia crushed, pinned and bone grafted  . Colonoscopy w/ polypectomy    . Multiple extractions with alveoloplasty N/A 12/06/2014    Procedure: Extraction of tooth #'s 2,4,5,6,7,8,9,10,11,12,13,14,15, 20,22,26,27,28,29 with alveoloplasty.;  Surgeon: Lenn Cal, DDS;  Location: [facility];  Service: Oral Surgery;  Laterality: N/A;    in for   No chief complaint on file.  sent for hyperkalemia  HPI  Adam Yang  is a 69 y.o. male, with history of nasopharyngeal cancer, on chemotherapy and radiation treatment, patient presents for a follow-up today with Dr. Alvy Bimler at oncology department for swelling and pain in right neck area for last few days, suspicion was for salivary gland infection, patient was prescribed Augmentin to be taken at home, basics labs were done during that visit, potassium came significantly high at 6.7, so oncology requested direct admission for management and treatment of hyperkalemia, patient had hyperglycemia with glucose of 598, he is on Decadron, eyes reports he didn't take his Amaryl today, as well he had sodium of 128, patient reports he is on clear liquid diet, barely tolerating water, even his neck is situs from radiation treatment and chemotherapy, is  totally dependent on feeding through G-tube, denies any fever or chills, reports generalized weakness.  Review of Systems    In addition to the HPI above,  No Fever-chills, No Headache, No changes with Vision or hearing, Report dysphagia and odynophagia secondary to nasopharyngeal cancer and mucositis. No Chest pain, Cough or Shortness of Breath, No Abdominal pain, No Nausea or Vommitting, reports constipation in general secondary to pain medication. No Blood in stool or Urine, No dysuria, No new skin rashes or bruises, No new joints pains-aches,  No new weakness, tingling, numbness in any extremity, No recent weight gain or loss, No polyuria, polydypsia or polyphagia, No significant Mental Stressors.  A full 10 point Review of Systems was done, except as stated above, all other Review of Systems were negative.   Social History History  Substance Use Topics  . Smoking status: Former Smoker -- 1.00 packs/day for 25 years    Quit date: 07/12/1998  . Smokeless tobacco: Never Used  . Alcohol Use: No     Family History Family History  Problem Relation Age of Onset  . Cancer Mother     throat ca  . Cancer Father     lung ca  . Cancer Brother     throat ca  . Heart block Brother      Prior to Admission medications   Medication Sig Start Date End Date Taking? Authorizing  Provider  acetaminophen (TYLENOL) 500 MG tablet Take 500-1,000 mg by mouth every 6 (six) hours as needed for mild pain.    Historical Provider, MD  Alum & Mag Hydroxide-Simeth (MAGIC MOUTHWASH W/LIDOCAINE) SOLN Take 10 mLs by mouth 4 (four) times daily as needed for mouth pain. 02/06/15   Heath Lark, MD  amLODipine (NORVASC) 5 MG tablet Take 5 mg by mouth every morning.     Historical Provider, MD  amoxicillin-clavulanate (AUGMENTIN) 875-125 MG per tablet Take 1 tablet by mouth 2 (two) times daily. 02/14/15   Heath Lark, MD  dexamethasone (DECADRON) 4 MG tablet Take 1 tablet (4 mg total) by mouth 2 (two) times  daily with a meal. 01/30/15   Heath Lark, MD  diphenhydrAMINE (BENADRYL) 25 MG tablet Take 25 mg by mouth every 6 (six) hours as needed.    Historical Provider, MD  emollient (BIAFINE) cream Apply 1 application topically daily.    Historical Provider, MD  fentaNYL (DURAGESIC - DOSED MCG/HR) 25 MCG/HR patch Place 1 patch (25 mcg total) onto the skin every 3 (three) days. 02/06/15   Heath Lark, MD  glimepiride (AMARYL) 2 MG tablet Take 2 mg by mouth 2 (two) times daily.  11/14/14   Historical Provider, MD  LORazepam (ATIVAN) 1 MG tablet Take 1 tablet (1 mg total) by mouth 2 (two) times daily as needed for anxiety. 12/23/14   Eppie Gibson, MD  Multiple Vitamins-Minerals (CENTRUM SILVER PO) Take by mouth daily.    Historical Provider, MD  Nutritional Supplements (FEEDING SUPPLEMENT, GLUCERNA 1.5 CAL,) LIQD Give 2 cans Glucerna 1.5 once daily via PEG and 1.5 cans TID with 150 cc free water before and after each bolus feeding. Total 6.5 cans daily-100% nutrition needs via PEG. 02/03/15 05/06/15  Heath Lark, MD  Omega-3 Fatty Acids (FISH OIL PO) Take by mouth daily.    Historical Provider, MD  ondansetron (ZOFRAN) 8 MG tablet Take 1 tablet (8 mg total) by mouth every 8 (eight) hours as needed. 02/06/15   Heath Lark, MD  oxyCODONE-acetaminophen (PERCOCET) 5-325 MG per tablet Take one or two tablets by mouth every 4-6 hours as needed for pain. 12/06/14   Lenn Cal, DDS    No Known Allergies  Physical Exam  Vitals  Blood pressure 153/81, pulse 83, temperature 98.2 F (36.8 C), temperature source Oral, resp. rate 12, SpO2 97 %.   1. General frail ill-appearing male lying in bed , in mild distress secondary to pain.  2. Normal affect and insight, Not Suicidal or Homicidal, Awake Alert, Oriented X 3.  3. No F.N deficits, ALL C.Nerves Intact, Strength 5/5 all 4 extremities, Sensation intact all 4 extremities, Plantars down going.  4. Ears and Eyes appear Normal, Conjunctivae clear, PERRLA. Has  ulcerated mucositis   5. Right side neck with swelling, tender, erythema,  6. Symmetrical Chest wall movement, Good air movement bilaterally, CTAB.  7. RRR, No Gallops, Rubs or Murmurs, No Parasternal Heave.  8. Positive Bowel Sounds, Abdomen Soft, No tenderness, No organomegaly appriciated,No rebound -guarding or rigidity.  9.  No Cyanosis, Normal Skin Turgor, No Skin Rash or Bruise.  10. Good muscle tone,  joints appear normal , no effusions, Normal ROM.     Data Review  CBC  Recent Labs Lab 02/10/15 0952 02/14/15 1329  WBC 7.2 10.4*  HGB 12.7* 12.5*  HCT 36.5* 36.9*  PLT 239 268  MCV 83.9 85.8  MCH 29.2 28.9  MCHC 34.8 33.7  RDW 14.1 14.4  LYMPHSABS 0.4*  0.2*  MONOABS 0.8 0.6  EOSABS 0.0 0.0  BASOSABS 0.0 0.1   ------------------------------------------------------------------------------------------------------------------  Chemistries   Recent Labs Lab 02/10/15 0952 02/14/15 1329  NA 135* 128*  K 4.8 6.7*  CO2 22 27  GLUCOSE 356* 598*  BUN 48.6* 58.8*  CREATININE 1.8* 1.8*  CALCIUM 9.5 9.3  AST 12 13  ALT 22 26  ALKPHOS 80 94  BILITOT 0.30 0.37   ------------------------------------------------------------------------------------------------------------------ estimated creatinine clearance is 37.6 mL/min (by C-G formula based on Cr of 1.8). ------------------------------------------------------------------------------------------------------------------ No results for input(s): TSH, T4TOTAL, T3FREE, THYROIDAB in the last 72 hours.  Invalid input(s): FREET3   Coagulation profile No results for input(s): INR, PROTIME in the last 168 hours. ------------------------------------------------------------------------------------------------------------------- No results for input(s): DDIMER in the last 72 hours. -------------------------------------------------------------------------------------------------------------------  Cardiac Enzymes No  results for input(s): CKMB, TROPONINI, MYOGLOBIN in the last 168 hours.  Invalid input(s): CK ------------------------------------------------------------------------------------------------------------------ Invalid input(s): POCBNP   ---------------------------------------------------------------------------------------------------------------  Urinalysis No results found for: COLORURINE, APPEARANCEUR, LABSPEC, [place], GLUCOSEU, HGBUR, BILIRUBINUR, KETONESUR, PROTEINUR, UROBILINOGEN, NITRITE, LEUKOCYTESUR  ----------------------------------------------------------------------------------------------------------------  Imaging results:   No results found.  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 - Patient will be hydrated aggressively, anticipated drop in his potassium after appropriate hydration.  parotid [place] gland infection - Patient has significant swelling in right neck, most likely related to infectious in etiology, will start on IV vancomycin, will obtain CT neck without contrast for further evaluation and rule out abscess.  Nasopharyngeal cancer - Hold by oncology Dr. Alvy Bimler  - Has finished all his treatments  Diabetes mellitus with hyperglycemia - Will start patient on IV glucose stabilizer, hold oral hypoglycemic agents, and you with tube feed, wants glucose is controlled, can be transitioned to insulin sliding  scale.  Hyponatremia - This is pseudohyponatremia secondary to hyperglycemia should correct after hyperglycemia treatment  Chronic kidney disease - Continue with IV fluids  Mucositis - Continue with Magic mouthwash, a liquid diet  Protein calorie malnutrition -Continue with tube feed   DVT Prophylaxis Heparin -   AM Labs Ordered, also please review Full Orders  Family Communication: Admission, patients condition and plan of care including tests being ordered have been discussed with the patient and wife who indicate understanding and agree with the plan and Code Status.  Code Status full code  Likely DC to  home when stable  Condition GUARDED    Time spent in minutes : 60 minutes    Jkayla Spiewak M.D on 02/14/2015 at 4:40 PM  Between 7am to 7pm - Pager - [phone]  After 7pm go to [email or web address] - password [email or web address]  And look for the night coverage person covering me after hours  [facility] Office  [phone]

## 2015-02-14 NOTE — Assessment & Plan Note (Addendum)
He has severe acute right jaw pain and swelling on the right side of the neck which I suspect is due to salivary gland infection. I recommend broad-spectrum IV anti-biotics, pain medicartion and CT imaging study to exclude abscess.

## 2015-02-14 NOTE — Telephone Encounter (Signed)
Adam Yang called with report that Adam Yang is demonstrating increased swelling in his right neck since completing Radiation therapy for Nasopharyngeal cancer on 02/10/15 and is more confused as compared to last tx. Date.  After conferring with Adam Yang, called Adam Mac, Adam Yang, Symptom Management and relayed above.  She is currently informing Adam Yang and will call Adam Yang to bring patient in today.

## 2015-02-14 NOTE — Assessment & Plan Note (Signed)
This is likely due to recent treatment. The patient denies recent history of bleeding such as epistaxis, hematuria or hematochezia. He is asymptomatic from the anemia. I will observe for now.    

## 2015-02-14 NOTE — Progress Notes (Signed)
Pt had fentyal patch on upper right back on prior to arrival.  Irven Baltimore, RN

## 2015-02-14 NOTE — Telephone Encounter (Signed)
Wife left VM states pt has new swelling in his neck and is "super confused."   Called wife and instructed her to bring pt in now for lab and to see Dr. Alvy Bimler.  She will try to bring him in as soon as she can.

## 2015-02-15 DIAGNOSIS — C119 Malignant neoplasm of nasopharynx, unspecified: Secondary | ICD-10-CM | POA: Diagnosis not present

## 2015-02-15 DIAGNOSIS — E875 Hyperkalemia: Secondary | ICD-10-CM | POA: Diagnosis not present

## 2015-02-15 DIAGNOSIS — K1120 Sialoadenitis, unspecified: Secondary | ICD-10-CM | POA: Diagnosis not present

## 2015-02-15 DIAGNOSIS — K1231 Oral mucositis (ulcerative) due to antineoplastic therapy: Secondary | ICD-10-CM | POA: Diagnosis not present

## 2015-02-15 LAB — GLUCOSE, CAPILLARY
GLUCOSE-CAPILLARY: 132 mg/dL — AB (ref 65–99)
GLUCOSE-CAPILLARY: 100 mg/dL — AB (ref 65–99)
Glucose-Capillary: 101 mg/dL — ABNORMAL HIGH (ref 65–99)
Glucose-Capillary: 110 mg/dL — ABNORMAL HIGH (ref 65–99)
Glucose-Capillary: 110 mg/dL — ABNORMAL HIGH (ref 65–99)
Glucose-Capillary: 213 mg/dL — ABNORMAL HIGH (ref 65–99)
Glucose-Capillary: 176 mg/dL — ABNORMAL HIGH (ref 65–99)
Glucose-Capillary: 179 mg/dL — ABNORMAL HIGH (ref 65–99)

## 2015-02-15 LAB — CBC
HCT: 30.8 % — ABNORMAL LOW (ref 39.0–52.0)
HEMOGLOBIN: 10.2 g/dL — AB (ref 13.0–17.0)
MCH: 27.3 pg (ref 26.0–34.0)
MCHC: 33.1 g/dL (ref 30.0–36.0)
MCV: 82.6 fL (ref 78.0–100.0)
PLATELETS: 151 10*3/uL (ref 150–400)
RBC: 3.73 MIL/uL — AB (ref 4.22–5.81)
RDW: 14.5 % (ref 11.5–15.5)
WBC: 6 10*3/uL (ref 4.0–10.5)

## 2015-02-15 LAB — BASIC METABOLIC PANEL
ANION GAP: 10 (ref 5–15)
Anion gap: 9 (ref 5–15)
BUN: 40 mg/dL — ABNORMAL HIGH (ref 6–20)
BUN: 48 mg/dL — AB (ref 6–20)
CHLORIDE: 98 mmol/L — AB (ref 101–111)
CO2: 27 mmol/L (ref 22–32)
CO2: 28 mmol/L (ref 22–32)
CREATININE: 1.23 mg/dL (ref 0.61–1.24)
Calcium: 7.6 mg/dL — ABNORMAL LOW (ref 8.9–10.3)
Calcium: 7.8 mg/dL — ABNORMAL LOW (ref 8.9–10.3)
Chloride: 98 mmol/L — ABNORMAL LOW (ref 101–111)
Creatinine, Ser: 1.14 mg/dL (ref 0.61–1.24)
GFR calc Af Amer: >60 mL/min (ref >60)
GFR calc non Af Amer: 59 mL/min — ABNORMAL LOW (ref >60)
GFR calc non Af Amer: >60 mL/min (ref >60)
GLUCOSE: 212 mg/dL — AB (ref 65–99)
Glucose, Bld: 146 mg/dL — ABNORMAL HIGH (ref 65–99)
Potassium: 3.9 mmol/L (ref 3.5–5.1)
Potassium: 4 mmol/L (ref 3.5–5.1)
SODIUM: 135 mmol/L (ref 135–145)
Sodium: 135 mmol/L (ref 135–145)

## 2015-02-15 LAB — HEMOGLOBIN A1C
Hgb A1c MFr Bld: 11.2 % — ABNORMAL HIGH (ref 4.8–5.6)
Mean Plasma Glucose: 275 mg/dL

## 2015-02-15 MED ORDER — GLUCERNA 1.2 CAL PO LIQD
1000.0000 mL | ORAL | Status: DC
  Administered 2015-02-15: 1000 mL
  Filled 2015-02-15 (×2): qty 1000

## 2015-02-15 MED ORDER — GLIMEPIRIDE 2 MG PO TABS
2.0000 mg | ORAL_TABLET | Freq: Two times a day (BID) | ORAL | Status: DC
  Administered 2015-02-15 – 2015-02-16 (×2): 2 mg via ORAL
  Filled 2015-02-15 (×5): qty 1

## 2015-02-15 MED ORDER — SODIUM CHLORIDE 0.9 % IV SOLN
INTRAVENOUS | Status: DC
  Administered 2015-02-15 (×2): via INTRAVENOUS

## 2015-02-15 MED ORDER — INSULIN ASPART 100 UNIT/ML ~~LOC~~ SOLN
0.0000 [IU] | SUBCUTANEOUS | Status: DC
  Administered 2015-02-15: 1 [IU] via SUBCUTANEOUS
  Administered 2015-02-15 (×2): 2 [IU] via SUBCUTANEOUS
  Administered 2015-02-15: 3 [IU] via SUBCUTANEOUS
  Administered 2015-02-16: 1 [IU] via SUBCUTANEOUS
  Administered 2015-02-16: 2 [IU] via SUBCUTANEOUS

## 2015-02-15 NOTE — Evaluation (Signed)
Physical Therapy Evaluation Patient Details Name: Adam Yang MRN: [id number] DOB: 1946/05/22 Today's Date: 02/15/2015   History of Present Illness  Pt admitted with dx hypercalemia and hx nasopharyngeal ca  Clinical Impression  Pt admitted as above and presenting with functional mobility limitations 2* generalized weakness and mild ambulatory instability.  Pt should progress to dc home with family assist.    Follow Up Recommendations No PT follow up    Equipment Recommendations  Rolling walker with 5" wheels    Recommendations for Other Services       Precautions / Restrictions Precautions Precautions: Fall Restrictions Weight Bearing Restrictions: No      Mobility  Bed Mobility Overal bed mobility: Modified Independent             General bed mobility comments: Pt to EOB with no assist  Transfers Overall transfer level: Needs assistance Equipment used: Rolling walker (2 wheeled) Transfers: Sit to/from Stand Sit to Stand: Min guard;Supervision         General transfer comment: min cues for use of UEs to self assist  Ambulation/Gait Ambulation/Gait assistance: Min assist Ambulation Distance (Feet): 200 Feet Assistive device: Rolling walker (2 wheeled) Gait Pattern/deviations: Step-through pattern;Decreased step length - right;Decreased step length - left;Shuffle Gait velocity: Decrease with several rests to complete task 2* elevating HR to 132   General Gait Details: Mild general instability with pt c/o feeling unsteady initially and lightheadedness (BP 166/82). Cues for posture and position from RW   Stairs            Wheelchair Mobility    Modified Rankin (Stroke Patients Only)       Balance Overall balance assessment: Needs assistance Sitting-balance support: Feet supported Sitting balance-Leahy Scale: Good     Standing balance support: No upper extremity supported Standing balance-Leahy Scale: Fair                                Pertinent Vitals/Pain Pain Assessment: No/denies pain    Home Living Family/patient expects to be discharged to:: Private residence Living Arrangements: Spouse/significant other Available Help at Discharge: Family Type of Home: House Home Access: Stairs to enter Entrance Stairs-Rails: None Entrance Stairs-Number of Steps: 2 Home Layout: Two level;Able to live on main level with bedroom/bathroom Home Equipment: None      Prior Function Level of Independence: Independent               Hand Dominance        Extremity/Trunk Assessment   Upper Extremity Assessment: Generalized weakness           Lower Extremity Assessment: Generalized weakness         Communication   Communication: No difficulties  Cognition Arousal/Alertness: Awake/alert Behavior During Therapy: WFL for tasks assessed/performed Overall Cognitive Status: Within Functional Limits for tasks assessed                      General Comments      Exercises        Assessment/Plan    PT Assessment Patient needs continued PT services  PT Diagnosis Difficulty walking   PT Problem List Decreased strength;Decreased activity tolerance;Decreased balance;Decreased mobility;Decreased knowledge of use of DME  PT Treatment Interventions DME instruction;Gait training;Stair training;Functional mobility training;Therapeutic activities;Therapeutic exercise;Patient/family education   PT Goals (Current goals can be found in the Care Plan section) Acute Rehab PT Goals Patient Stated Goal: Regain strength and resume  previous lifestyle asap PT Goal Formulation: With patient Time For Goal Achievement: 02/22/15 Potential to Achieve Goals: Good    Frequency Min 3X/week   Barriers to discharge        Co-evaluation               End of Session   Activity Tolerance: Patient tolerated treatment well;Patient limited by fatigue Patient left: in chair;with call bell/phone within  reach Nurse Communication: Mobility status         Time: 1235-1300 PT Time Calculation (min) (ACUTE ONLY): 25 min   Charges:   PT Evaluation $Initial PT Evaluation Tier I: 1 Procedure PT Treatments $Gait Training: 8-22 mins   PT G Codes:        Adam Yang 03-02-15, 4:09 PM

## 2015-02-15 NOTE — Progress Notes (Signed)
Utilization Review completed. Manuel Dall RN BSN CM 

## 2015-02-15 NOTE — Progress Notes (Signed)
Patient Demographics  Adam Yang, is a 69 y.o. male, DOB - 1946-04-17, [id number]  Admit date - 02/14/2015   Admitting Physician Albertine Patricia, MD  Outpatient Primary MD for the patient is  Melinda Crutch, MD  LOS - 1   No chief complaint on file.    hyperkalemia  Admission HPI/Brief narrative: Adam Yang is a 69 y.o. male, with history of nasopharyngeal cancer, on chemotherapy and radiation treatment, direct admission from Dr Alvy Bimler office for hyperkalemia and parotitis.  Subjective:   Adam Yang today has, No headache, No chest pain, No abdominal pain - No Nausea,  he feels significantly better today . 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  Nasopharyngeal cancer - Followed by oncology Dr. Alvy Bimler  - Has finished all his treatments  Diabetes mellitus with hyperglycemia - off IV glucose stabilizer, on insulin sliding scale, resume Amaryl  Hyponatremia - This is pseudohyponatremia secondary to hyperglycemia , resolved.  Chronic kidney disease - Continue with IV fluids  Mucositis - Continue with Magic mouthwash, a liquid diet  Protein calorie malnutrition -Continue with tube feed   Code Status: Full  Family Communication: Discussed with wife  Disposition Plan: Transfer to medical floor today, for discharge in 24 hours if remains stable.   Procedures  None  Consults   None   Medications  Scheduled Meds: .  amLODipine  5 mg Per Tube q morning - 10a  . dexamethasone  4 mg Per Tube BID WC  . fentaNYL  25 mcg Transdermal Q72H  . heparin  5,000 Units Subcutaneous 3 times per day  . insulin aspart  0-9 Units Subcutaneous 6 times per day  . omega-3 acid ethyl esters  1 g Per Tube Daily  . piperacillin-tazobactam (ZOSYN)  IV  3.375 g Intravenous Q8H  . sodium chloride  3 mL Intravenous Q12H   Continuous Infusions: . sodium chloride 75 mL/hr at 02/15/15 1200  . feeding supplement (GLUCERNA 1.2 CAL) 1,000 mL (02/15/15 1200)   PRN Meds:.acetaminophen **OR** acetaminophen, albuterol, bisacodyl, dextrose, HYDROcodone-acetaminophen, LORazepam, magic mouthwash w/lidocaine, ondansetron **OR** ondansetron (ZOFRAN) IV, oxyCODONE-acetaminophen, polyethylene glycol  DVT Prophylaxis  subcutaneous heparin  Lab Results  Component Value Date   PLT 151 02/15/2015    Antibiotics   Anti-infectives    Start     Dose/Rate Route Frequency Ordered Stop   02/15/15 0200  piperacillin-tazobactam (ZOSYN) IVPB 3.375 g     3.375 g 12.5 mL/hr over 240 Minutes Intravenous Every 8 hours 02/14/15 1732     02/14/15 2200  amoxicillin-clavulanate (AUGMENTIN) 875-125 MG per tablet 1 tablet  Status:  Discontinued     1 tablet Oral 2 times daily 02/14/15 1608 02/14/15 1629   02/14/15 1645  piperacillin-tazobactam (ZOSYN) IVPB 3.375 g     3.375 g 12.5  mL/hr over 240 Minutes Intravenous  Once 02/14/15 1631 02/14/15 2217          Objective:   Filed Vitals:   02/15/15 0600 02/15/15 0700 02/15/15 0800 02/15/15 1200  BP: 131/67 146/66 163/69 145/66  Pulse: 73 73 73 87  Temp:   98.5 F (36.9 C)   TempSrc:   Oral   Resp: 12 12 15 11   Height:      Weight:      SpO2: 94% 97% 96% 94%    Wt Readings from Last 3 Encounters:  02/14/15 67.2 kg (148 lb 2.4 oz)  02/14/15 66.815 kg (147 lb 4.8 oz)  02/11/15 67.722 kg (149 lb 4.8 oz)     Intake/Output Summary (Last 24 hours) at 02/15/15 1445 Last data filed at 02/15/15  1200  Gross per 24 hour  Intake 1140.11 ml  Output   1650 ml  Net -509.89 ml     Physical Exam  Awake Alert, Oriented X 3,  oral mucositis  right neck swelling and erythema mildly subsided  Symmetrical Chest wall movement, Good air movement bilaterally.  RRR,No Gallops,Rubs or new Murmurs, No Parasternal Heave +ve B.Sounds, Abd Soft, No tenderness, No organomegaly appriciated, No rebound - guarding or rigidity. PEG +. No Cyanosis, Clubbing or edema, No new Rash or bruise     Data Review   Micro Results Recent Results (from the past 240 hour(s))  TECHNOLOGIST REVIEW     Status: None   Collection Time: 02/06/15 10:43 AM  Result Value Ref Range Status   Technologist Review few Metas and Myelocytes present  Final  TECHNOLOGIST REVIEW     Status: None   Collection Time: 02/10/15  9:52 AM  Result Value Ref Range Status   Technologist Review Metas and Myelocytes present  Final  TECHNOLOGIST REVIEW     Status: None   Collection Time: 02/14/15  1:29 PM  Result Value Ref Range Status   Technologist Review Metas and Myelocytes present  Final    Radiology Reports Ct Soft Tissue Neck Wo Contrast  02/14/2015   CLINICAL DATA:  Patient with nasopharyngeal cancer receiving chemotherapy and XRT presents with RIGHT-sided neck swelling of an acute nature.  EXAM: CT NECK WITHOUT CONTRAST  TECHNIQUE: Multidetector CT imaging of the neck was performed following the standard protocol without intravenous contrast.  COMPARISON:  CT neck from 11/21/2014.  PET scan 12/04/2014.  FINDINGS: The RIGHT parotid gland is markedly swollen and hyperdense compared to the normal LEFT. Mild peripheral stranding. No salivary duct calculi are observed. Findings likely represent acute bacterial or viral parotitis. No abscess is seen.  There may be slightly enlarged RIGHT submandibular gland, but no similar hyperintensity or significant stranding, uncertain significance.  The patient's previously noted nasopharyngeal mass  has shown marked involution in comparison with May. LEFT mastoid effusion persists, but without evidence for significant worsening since May.  Improved, but still measurably abnormal LEFT level 5/2B and RIGHT level 2B conglomerate nodal masses. Ex-nodal disease with stranding on the LEFT appears improved. No new areas of concern for adenopathy on this noncontrast exam.  BILATERAL carotid bifurcation plaque redemonstrated. RIGHT IJ PICC line appears uncomplicated. Grossly negative sinuses and orbits. Negative visualized limited intracranial compartment. Lung apices clear.  IMPRESSION: Marked involution previously noted LEFT nasopharyngeal mass.  RIGHT parotid gland is markedly swollen and hyperdense compared to the normal LEFT. Acute viral or bacterial parotitis is suspected. No abscess is seen.  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  Electronically Signed   By: Staci Righter M.D.   On: 02/14/2015 21:26     CBC  Recent Labs Lab 02/10/15 0952 02/14/15 1329 02/14/15 1720 02/15/15 0400  WBC 7.2 10.4* 7.9 6.0  HGB 12.7* 12.5* 11.7* 10.2*  HCT 36.5* 36.9* 33.9* 30.8*  PLT 239 268 187 151  MCV 83.9 85.8 83.7 82.6  MCH 29.2 28.9 28.9 27.3  MCHC 34.8 33.7 34.5 33.1  RDW 14.1 14.4 14.5 14.5  LYMPHSABS 0.4* 0.2*  --   --   MONOABS 0.8 0.6  --   --   EOSABS 0.0 0.0  --   --   BASOSABS 0.0 0.1  --   --     Chemistries   Recent Labs Lab 02/10/15 0952 02/14/15 1329 02/14/15 1720 02/14/15 2350 02/15/15 0400  NA 135* 128*  --  135 135  K 4.8 6.7*  --  4.0 3.9  CL  --   --   --  98* 98*  CO2 22 27  --  28 27  GLUCOSE 356* 598*  --  212* 146*  BUN 48.6* 58.8*  --  48* 40*  CREATININE 1.8* 1.8* 1.67* 1.23 1.14  CALCIUM 9.5 9.3  --  7.6* 7.8*  AST 12 13  --   --   --   ALT 22 26  --   --   --   ALKPHOS 80 94  --   --   --   BILITOT 0.30 0.37  --   --   --     ------------------------------------------------------------------------------------------------------------------ estimated creatinine clearance is 59.8 mL/min (by C-G formula based on Cr of 1.14). ------------------------------------------------------------------------------------------------------------------  Recent Labs  02/14/15 1720  HGBA1C 11.2*   ------------------------------------------------------------------------------------------------------------------ No results for input(s): CHOL, HDL, LDLCALC, TRIG, CHOLHDL, LDLDIRECT in the last 72 hours. ------------------------------------------------------------------------------------------------------------------ No results for input(s): TSH, T4TOTAL, T3FREE, THYROIDAB in the last 72 hours.  Invalid input(s): FREET3 ------------------------------------------------------------------------------------------------------------------ No results for input(s): VITAMINB12, FOLATE, FERRITIN, TIBC, IRON, RETICCTPCT in the last 72 hours.  Coagulation profile No results for input(s): INR, PROTIME in the last 168 hours.  No results for input(s): DDIMER in the last 72 hours.  Cardiac Enzymes No results for input(s): CKMB, TROPONINI, MYOGLOBIN in the last 168 hours.  Invalid input(s): CK ------------------------------------------------------------------------------------------------------------------ Invalid input(s): POCBNP     Time Spent in minutes   25 minutes    Jannessa Ogden M.D on 02/15/2015 at 2:45 PM  Between 7am to 7pm - Pager - [phone]  After 7pm go to [email or web address] - password [facility]  [facility]   Office  [phone]

## 2015-02-16 DIAGNOSIS — E46 Unspecified protein-calorie malnutrition: Secondary | ICD-10-CM | POA: Diagnosis not present

## 2015-02-16 DIAGNOSIS — K1120 Sialoadenitis, unspecified: Secondary | ICD-10-CM | POA: Diagnosis not present

## 2015-02-16 DIAGNOSIS — K1231 Oral mucositis (ulcerative) due to antineoplastic therapy: Secondary | ICD-10-CM | POA: Diagnosis not present

## 2015-02-16 LAB — BASIC METABOLIC PANEL
Anion gap: 7 (ref 5–15)
BUN: 30 mg/dL — AB (ref 6–20)
CALCIUM: 7.6 mg/dL — AB (ref 8.9–10.3)
CO2: 28 mmol/L (ref 22–32)
Chloride: 102 mmol/L (ref 101–111)
Creatinine, Ser: 0.97 mg/dL (ref 0.61–1.24)
GFR calc non Af Amer: >60 mL/min (ref >60)
Glucose, Bld: 102 mg/dL — ABNORMAL HIGH (ref 65–99)
Potassium: 4.1 mmol/L (ref 3.5–5.1)
SODIUM: 137 mmol/L (ref 135–145)

## 2015-02-16 LAB — GLUCOSE, CAPILLARY
GLUCOSE-CAPILLARY: 104 mg/dL — AB (ref 65–99)
GLUCOSE-CAPILLARY: 136 mg/dL — AB (ref 65–99)
GLUCOSE-CAPILLARY: 160 mg/dL — AB (ref 65–99)
Glucose-Capillary: 105 mg/dL — ABNORMAL HIGH (ref 65–99)

## 2015-02-16 MED ORDER — AMOXICILLIN-POT CLAVULANATE 875-125 MG PO TABS: 1.0000 | ORAL_TABLET | Freq: Two times a day (BID) | ORAL | Status: DC

## 2015-02-16 MED ORDER — LACTINEX PO CHEW: 1.0000 | CHEWABLE_TABLET | Freq: Three times a day (TID) | ORAL | Status: DC

## 2015-02-16 MED ORDER — HEPARIN SOD (PORK) LOCK FLUSH 100 UNIT/ML IV SOLN
500.0000 [IU] | INTRAVENOUS | Status: AC | PRN
  Administered 2015-02-16: 500 [IU]

## 2015-02-16 MED ORDER — SODIUM CHLORIDE 0.9 % IJ SOLN
10.0000 mL | INTRAMUSCULAR | Status: DC | PRN
  Administered 2015-02-16 (×2): 10 mL
  Filled 2015-02-16: qty 40

## 2015-02-16 NOTE — Progress Notes (Signed)
Pt left at this time with his spouse. Discharge instructions/prescriptions given/explained with pt and spouse verbalizing understanding. Followup appointments noted. PAC clamped by IV team. Pt refused shower chair from [facility].

## 2015-02-16 NOTE — Clinical Documentation Improvement (Signed)
Please specify the severity / degree of the protein calorie malnutrition present this admission (mild, moderate, severe) and document in your future progress notes and discharge summary.  Nursing flowsheets indicate height 5 feet 8 inches, weight 149 pounds, and BMI 22.8.  MD progress notes state pt totally dependent on feeding through G-tube d/t his cancer and chemo / radiation.  Thank you, Mateo Flow, RN [phone] Clinical Documentation Specialist

## 2015-02-16 NOTE — Discharge Instructions (Signed)
Follow with Primary MD  Melinda Crutch, MD (keep your appointment this coming Tuesday)  Get CBC, CMP,  checked  by Primary MD next visit.    Activity: As tolerated with Full fall precautions use walker/cane & assistance as needed   Disposition Home    Diet: Clear liquid, continue with tube feed  , with feeding assistance and aspiration precautions.  For Heart failure patients - Check your Weight same time everyday, if you gain over 2 pounds, or you develop in leg swelling, experience more shortness of breath or chest pain, call your Primary MD immediately. Follow Cardiac Low Salt Diet and 1.5 lit/day fluid restriction.   On your next visit with your primary care physician please Get Medicines reviewed and adjusted.   Please request your Prim.MD to go over all Hospital Tests and Procedure/Radiological results at the follow up, please get all Hospital records sent to your Prim MD by signing hospital release before you go home.   If you experience worsening of your admission symptoms, develop shortness of breath, life threatening emergency, suicidal or homicidal thoughts you must seek medical attention immediately by calling 911 or calling your MD immediately  if symptoms less severe.  You Must read complete instructions/literature along with all the possible adverse reactions/side effects for all the Medicines you take and that have been prescribed to you. Take any new Medicines after you have completely understood and accpet all the possible adverse reactions/side effects.   Do not drive, operating heavy machinery, perform activities at heights, swimming or participation in water activities or provide baby sitting services if your were admitted for syncope or siezures until you have seen by Primary MD or a Neurologist and advised to do so again.  Do not drive when taking Pain medications.    Do not take more than prescribed Pain, Sleep and Anxiety Medications  Special Instructions: If you  have smoked or chewed Tobacco  in the last 2 yrs please stop smoking, stop any regular Alcohol  and or any Recreational drug use.  Wear Seat belts while driving.   Please note  You were cared for by a hospitalist during your hospital stay. If you have any questions about your discharge medications or the care you received while you were in the hospital after you are discharged, you can call the unit and asked to speak with the hospitalist on call if the hospitalist that took care of you is not available. Once you are discharged, your primary care physician will handle any further medical issues. Please note that NO REFILLS for any discharge medications will be authorized once you are discharged, as it is imperative that you return to your primary care physician (or establish a relationship with a primary care physician if you do not have one) for your aftercare needs so that they can reassess your need for medications and monitor your lab values.

## 2015-02-16 NOTE — Discharge Summary (Signed)
Adam Yang, is a 69 y.o. male  DOB [birth year]  MRN [id number].  Admission date:  02/14/2015  Admitting Physician  Albertine Patricia, MD  Discharge Date:  02/16/2015   Primary MD   Melinda Crutch, MD  Recommendations for primary care physician for things to follow:  - Check CBC, CMP during next visit. - Agent to continue follow with oncology, radiation oncology as previously scheduled appointments.   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 Hyperlipidemia   . Hypertension   . Complication of anesthesia   . PONV (postoperative nausea and vomiting)   . Diabetes mellitus without complication     Type II  . Chronic kidney disease     Renal Insuffiency , Creatinine has been in the 2- 3 range, 11/27/14 Creatine 1.4  . Gout   . Arthritis   . Anxiety     Claustrophobia  . Cancer     Past Surgical History  Procedure Laterality Date  . Lymph node biopsy    . Colonoscopy    . Knee surgery Left 1990's    tibia crushed, pinned and bone grafted  . Colonoscopy w/ polypectomy    . Multiple extractions with alveoloplasty N/A 12/06/2014    Procedure: Extraction of tooth #'s 2,4,5,6,7,8,9,10,11,12,13,14,15, 20,22,26,27,28,29 with alveoloplasty.;  Surgeon: Lenn Cal, DDS;  Location: [facility];  Service: Oral Surgery;  Laterality: N/A;       History of present illness and  Hospital Course:     Kindly see H&P for history of present illness and admission  details, please review complete Labs, Consult reports and Test reports for all details in brief  HPI  from the history and physical done on the day of admission on 8/5  Adam Yang is a 69 y.o. male, with history of nasopharyngeal cancer, on chemotherapy and radiation treatment, patient presents for a follow-up today with Dr. Alvy Bimler at oncology department for swelling and pain in right neck area for last few days, suspicion was for salivary gland infection, patient was prescribed Augmentin to be taken at home, basics labs were done during that visit, potassium came significantly high at 6.7, so oncology requested direct admission for management and treatment of hyperkalemia, patient had hyperglycemia with glucose of 598, he is on Decadron, eyes reports he didn't take his Amaryl today, as well he had sodium of 128, patient reports he is on clear liquid diet, barely tolerating water, even his neck is situs from radiation treatment and chemotherapy, is totally dependent on feeding through G-tube, denies any fever or chills, reports generalized weakness.  Hospital Course  Hyperkalemia - Resolved  parotid /salivary gland infection - CT neck without contrast showing no evidence of abscess ,treated with IV Zosyn during hospital stay, transitioned to oral Augmentin on discharge.  Nasopharyngeal cancer - Followed by oncology Dr. Alvy Bimler  - Has finished all his treatments  Diabetes mellitus with hyperglycemia - Initially on IV glucose stabilizer, then on insulin sliding scale during hospital stay with acceptable CBG, resume Amaryl on discharge.  Hyponatremia - This is pseudohyponatremia secondary to hyperglycemia , resolved.  Acute on Chronic kidney disease - Kidney function back to normal with IV fluids, creatinine is 0.97 on day of discharge  Mucositis - Continue with Magic mouthwash, and liquid diet  Protein calorie malnutrition/moderate -Continue with tube feed      Discharge  Condition:  stable   Follow UP  Follow-up Information    Call  Melinda Crutch, MD.   Specialty:  Family Medicine   Why:  Keep your appointment   Contact information:   [place] [address] [phone]         Discharge Instructions  and  Discharge Medications     Discharge Instructions    Discharge instructions    Complete by:  As directed   Follow with Primary MD  Melinda Crutch, MD (keep your appointment this coming Tuesday)  Get CBC, CMP,  checked  by Primary MD next visit.    Activity: As tolerated with Full fall precautions use walker/cane & assistance as needed   Disposition Home    Diet: Clear liquid, continue with tube feed  , with feeding assistance and aspiration precautions.  For Heart failure patients - Check your Weight same time everyday, if you gain over 2 pounds, or you develop in leg swelling, experience more shortness of breath or chest pain, call your Primary MD immediately. Follow Cardiac Low Salt Diet and 1.5 lit/day fluid restriction.   On your next visit with your primary care physician please Get Medicines reviewed and adjusted.   Please request your Prim.MD to go over all Hospital Tests and Procedure/Radiological results at the follow up, please get all Hospital records sent to your Prim MD by signing hospital release before you go home.   If you experience worsening of your admission symptoms, develop shortness of breath, life threatening emergency, suicidal or homicidal thoughts you must seek medical attention immediately by calling 911 or calling your MD immediately  if symptoms less severe.  You Must read complete instructions/literature along with all the possible adverse reactions/side effects for all the Medicines you take and that have been prescribed to you. Take any new Medicines after you have completely understood and accpet all the possible adverse reactions/side effects.   Do not drive, operating heavy machinery,  perform activities at heights, swimming or participation in water activities or provide baby sitting services if your were admitted for syncope or siezures until you have seen by Primary MD or a Neurologist and advised to do so again.  Do not drive when taking Pain medications.    Do not take more than prescribed Pain, Sleep and Anxiety Medications  Special Instructions: If you have smoked or chewed Tobacco  in the last 2 yrs please stop smoking, stop any regular Alcohol  and or any Recreational drug use.  Wear Seat belts while driving.   Please note  You were cared for by a hospitalist during your hospital stay. If you have any questions about your discharge medications or the care you received while you were  in the hospital after you are discharged, you can call the unit and asked to speak with the hospitalist on call if the hospitalist that took care of you is not available. Once you are discharged, your primary care physician will handle any further medical issues. Please note that NO REFILLS for any discharge medications will be authorized once you are discharged, as it is imperative that you return to your primary care physician (or establish a relationship with a primary care physician if you do not have one) for your aftercare needs so that they can reassess your need for medications and monitor your lab values.     Increase activity slowly    Complete by:  As directed             Medication List    TAKE these medications        acetaminophen 500 MG tablet  Commonly known as:  TYLENOL  Take 500-1,000 mg by mouth every 6 (six) hours as needed for mild pain.     amLODipine 5 MG tablet  Commonly known as:  NORVASC  Take 5 mg by mouth 2 (two) times daily.     amoxicillin-clavulanate 875-125 MG per tablet  Commonly known as:  AUGMENTIN  Take 1 tablet by mouth 2 (two) times daily.     antiseptic oral rinse Liqd  15 mLs by Mouth Rinse route as needed for dry mouth.      CENTRUM SILVER PO  Take by mouth daily.     dexamethasone 4 MG tablet  Commonly known as:  DECADRON  Take 1 tablet (4 mg total) by mouth 2 (two) times daily with a meal.     diphenhydrAMINE 25 MG tablet  Commonly known as:  BENADRYL  Take 25 mg by mouth every 6 (six) hours as needed for allergies.     emollient cream  Commonly known as:  BIAFINE  Apply 1 application topically daily.     feeding supplement (GLUCERNA 1.5 CAL) Liqd  Give 2 cans Glucerna 1.5 once daily via PEG and 1.5 cans TID with 150 cc free water before and after each bolus feeding. Total 6.5 cans daily-100% nutrition needs via PEG.     fentaNYL 25 MCG/HR patch  Commonly known as:  DURAGESIC - dosed mcg/hr  Place 1 patch (25 mcg total) onto the skin every 3 (three) days.     FISH OIL PO  Take by mouth daily.     glimepiride 2 MG tablet  Commonly known as:  AMARYL  Take 2 mg by mouth 2 (two) times daily.     lactobacillus acidophilus & bulgar chewable tablet  Chew 1 tablet by mouth 3 (three) times daily with meals.     LORazepam 1 MG tablet  Commonly known as:  ATIVAN  Take 1 tablet (1 mg total) by mouth 2 (two) times daily as needed for anxiety.     magic mouthwash w/lidocaine Soln  Take 10 mLs by mouth 4 (four) times daily as needed for mouth pain.     ondansetron 8 MG tablet  Commonly known as:  ZOFRAN  Take 1 tablet (8 mg total) by mouth every 8 (eight) hours as needed.     oxyCODONE-acetaminophen 5-325 MG per tablet  Commonly known as:  PERCOCET  Take one or two tablets by mouth every 4-6 hours as needed for pain.          Diet and Activity recommendation: See Discharge Instructions above   Consults obtained -  none   Major procedures and Radiology Reports - PLEASE review detailed and final reports for all details, in brief -      Ct Soft Tissue Neck Wo Contrast  02/14/2015   CLINICAL DATA:  Patient with nasopharyngeal cancer receiving chemotherapy and XRT presents with RIGHT-sided  neck swelling of an acute nature.  EXAM: CT NECK WITHOUT CONTRAST  TECHNIQUE: Multidetector CT imaging of the neck was performed following the standard protocol without intravenous contrast.  COMPARISON:  CT neck from 11/21/2014.  PET scan 12/04/2014.  FINDINGS: The RIGHT parotid gland is markedly swollen and hyperdense compared to the normal LEFT. Mild peripheral stranding. No salivary duct calculi are observed. Findings likely represent acute bacterial or viral parotitis. No abscess is seen.  There may be slightly enlarged RIGHT submandibular gland, but no similar hyperintensity or significant stranding, uncertain significance.  The patient's previously noted nasopharyngeal mass has shown marked involution in comparison with May. LEFT mastoid effusion persists, but without evidence for significant worsening since May.  Improved, but still measurably abnormal LEFT level 5/2B and RIGHT level 2B conglomerate nodal masses. Ex-nodal disease with stranding on the LEFT appears improved. No new areas of concern for adenopathy on this noncontrast exam.  BILATERAL carotid bifurcation plaque redemonstrated. RIGHT IJ PICC line appears uncomplicated. Grossly negative sinuses and orbits. Negative visualized limited intracranial compartment. Lung apices clear.  IMPRESSION: Marked involution previously noted LEFT nasopharyngeal mass.  RIGHT parotid gland is markedly swollen and hyperdense compared to the normal LEFT. Acute viral or bacterial parotitis is suspected. No abscess is seen.  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   Electronically Signed   By: Staci Righter M.D.   On: 02/14/2015 21:26    Micro Results     Recent Results (from the past 240 hour(s))  TECHNOLOGIST REVIEW     Status: None   Collection Time: 02/06/15 10:43 AM  Result Value Ref Range Status   Technologist Review few Metas and Myelocytes present  Final  TECHNOLOGIST REVIEW     Status: None    Collection Time: 02/10/15  9:52 AM  Result Value Ref Range Status   Technologist Review Metas and Myelocytes present  Final  TECHNOLOGIST REVIEW     Status: None   Collection Time: 02/14/15  1:29 PM  Result Value Ref Range Status   Technologist Review Metas and Myelocytes present  Final       Today   Subjective:   Adam Yang today has no headache,no chest abdominal pain,no new weakness tingling or numbness, feels much better wants to go home today. Neck pain significantly subsided.  Objective:   Blood pressure 140/75, pulse 89, temperature 98.2 F (36.8 C), temperature source Oral, resp. rate 17, height 5\' 8"  (1.727 m), weight 67.858 kg (149 lb 9.6 oz), SpO2 94 %.   Intake/Output Summary (Last 24 hours) at 02/16/15 1003 Last data filed at 02/16/15 0700  Gross per 24 hour  Intake 2005.42 ml  Output   1700 ml  Net 305.42 ml    Exam Awake Alert, Oriented X 3,  oral mucositis right neck swelling and erythema improving Symmetrical chest wall movement ,Good air movement bilaterally.  RRR,No Gallops,Rubs or new Murmurs, No Parasternal Heave +ve B.Sounds, Abd Soft, No tenderness, No organomegaly appriciated, No rebound - guarding or rigidity. PEG +. No Cyanosis, Clubbing or edema, No new Rash or bruise   Data Review   CBC w Diff: Lab Results  Component Value Date   WBC 6.0 02/15/2015  WBC 10.4* 02/14/2015   HGB 10.2* 02/15/2015   HGB 12.5* 02/14/2015   HCT 30.8* 02/15/2015   HCT 36.9* 02/14/2015   PLT 151 02/15/2015   PLT 268 02/14/2015   LYMPHOPCT 1.4* 02/14/2015   LYMPHOPCT 16 12/17/2014   MONOPCT 5.4 02/14/2015   MONOPCT 11 12/17/2014   EOSPCT 0.0 02/14/2015   EOSPCT 5 12/17/2014   BASOPCT 0.8 02/14/2015   BASOPCT 0 12/17/2014    CMP: Lab Results  Component Value Date   NA 137 02/16/2015   NA 128* 02/14/2015   K 4.1 02/16/2015   K 6.7* 02/14/2015   CL 102 02/16/2015   CO2 28 02/16/2015   CO2 27 02/14/2015   BUN 30* 02/16/2015   BUN 58.8*  02/14/2015   CREATININE 0.97 02/16/2015   CREATININE 1.8* 02/14/2015   PROT 6.5 02/14/2015   ALBUMIN 2.2* 02/14/2015   BILITOT 0.37 02/14/2015   ALKPHOS 94 02/14/2015   AST 13 02/14/2015   ALT 26 02/14/2015  .   Total Time in preparing paper work, data evaluation and todays exam - 35 minutes  Cuthbert Turton M.D on 02/16/2015 at 10:03 AM  [facility]   Office  [phone]

## 2015-02-19 ENCOUNTER — Telehealth: Payer: Self-pay | Admitting: *Deleted

## 2015-02-19 NOTE — Telephone Encounter (Signed)
Called wife and left VM informing that Dr. Alvy Bimler can r/s pt to next week if he doesn't feel like coming in tomorrow as scheduled.  Make sure he has enough pain meds until next visit.  Please call back tomorrow.

## 2015-02-19 NOTE — Telephone Encounter (Signed)
Wife left VM states pt seen by PCP yesterday and had labs done there.  He is hoping to stay home tomorrow.  Does he need to keep appt w/ Dr. Alvy Bimler tomorrow?    I called Dr. Harrington Challenger' office and pt did have some labs done yesterday but they do not have results back yet.  They will fax results for labs done; BMET, Hgb A1-C and c-Peptide.

## 2015-02-20 ENCOUNTER — Other Ambulatory Visit: Payer: Self-pay | Admitting: Hematology and Oncology

## 2015-02-20 ENCOUNTER — Other Ambulatory Visit: Payer: Self-pay

## 2015-02-20 ENCOUNTER — Telehealth: Payer: Self-pay | Admitting: *Deleted

## 2015-02-20 ENCOUNTER — Encounter: Payer: Self-pay | Admitting: Nutrition

## 2015-02-20 ENCOUNTER — Telehealth: Payer: Self-pay | Admitting: Hematology and Oncology

## 2015-02-20 ENCOUNTER — Telehealth: Payer: Self-pay | Admitting: Nutrition

## 2015-02-20 ENCOUNTER — Ambulatory Visit: Payer: Self-pay | Admitting: Hematology and Oncology

## 2015-02-20 NOTE — Telephone Encounter (Signed)
I will place POF for rtn visit only, no labs

## 2015-02-20 NOTE — Telephone Encounter (Signed)
I cancelled his appt today pending call back when he wants to return

## 2015-02-20 NOTE — Telephone Encounter (Signed)
Wife left VM this morning states pt has stopped his fentanyl patch and morphine. He is only taking tylenol for pain at this time so he does not need any refills.  He would like to see Dr. Alvy Bimler next Thursday morning if possible.

## 2015-02-20 NOTE — Telephone Encounter (Signed)
Called and left a message with 8/18 appointment

## 2015-02-20 NOTE — Progress Notes (Signed)
Mistake.

## 2015-02-20 NOTE — Telephone Encounter (Addendum)
[name] left message on my voicemail. Wife concerned about tube feeding formula causing hyperglycemia. Patient is on appropriate tube feeding utilizing Glucerna 1.5. Left message on patient's phone to continue Glucerna 1.5 as previously educated.

## 2015-02-21 ENCOUNTER — Ambulatory Visit: Payer: Medicare Other | Attending: Hematology and Oncology | Admitting: Physical Therapy

## 2015-02-21 ENCOUNTER — Encounter: Payer: Self-pay | Admitting: Hematology and Oncology

## 2015-02-21 DIAGNOSIS — M2560 Stiffness of unspecified joint, not elsewhere classified: Secondary | ICD-10-CM | POA: Diagnosis present

## 2015-02-21 DIAGNOSIS — M436 Torticollis: Secondary | ICD-10-CM | POA: Diagnosis present

## 2015-02-21 DIAGNOSIS — R5381 Other malaise: Secondary | ICD-10-CM | POA: Diagnosis not present

## 2015-02-21 DIAGNOSIS — M545 Low back pain, unspecified: Secondary | ICD-10-CM

## 2015-02-21 DIAGNOSIS — R1310 Dysphagia, unspecified: Secondary | ICD-10-CM | POA: Insufficient documentation

## 2015-02-21 NOTE — Progress Notes (Signed)
I placed leave of abs forms on desk of nurse for dr. Alvy Bimler

## 2015-02-21 NOTE — Therapy (Signed)
[place], [place], [address] Phone: [phone]   Fax:  [phone]  Physical Therapy Evaluation  Patient Details  Name: Adam Yang MRN: [id number] Date of Birth: 1946-01-06 Referring Provider:  Heath Lark, MD  Encounter Date: 02/21/2015      PT End of Session - 02/21/15 1203    Visit Number 1   Number of Visits 9   Date for PT Re-Evaluation 03/28/15   PT Start Time 1110   PT Stop Time 1155   PT Time Calculation (min) 45 min   Activity Tolerance Patient limited by fatigue;Patient tolerated treatment well   Behavior During Therapy [facility] for tasks assessed/performed      Past Medical History  Diagnosis Date  . Hyperlipidemia   . Hypertension   . Complication of anesthesia   . PONV (postoperative nausea and vomiting)   . Diabetes mellitus without complication     Type II  . Chronic kidney disease     Renal Insuffiency , Creatinine has been in the 2- 3 range, 11/27/14 Creatine 1.4  . Gout   . Arthritis   . Anxiety     Claustrophobia  . Cancer     Past Surgical History  Procedure Laterality Date  . Lymph node biopsy    . Colonoscopy    . Knee surgery Left 1990's    tibia crushed, pinned and bone grafted  . Colonoscopy w/ polypectomy    . Multiple extractions with alveoloplasty N/A 12/06/2014    Procedure: Extraction of tooth #'s 2,4,5,6,7,8,9,10,11,12,13,14,15, 20,22,26,27,28,29 with alveoloplasty.;  Surgeon: Lenn Cal, DDS;  Location: [facility];  Service: Oral Surgery;  Laterality: N/A;    There were no vitals filed for this visit.  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 02/10/15; has had dental extractions 12/06/14 and is to have PEG and Portacath placed 12/17/14.  Still using PEG tube to eat; got some pudding down the other day.  h/o C4-5 compression fracture remotely, mostly resolved and did traction for that; does have some residual left hadn weakness.  left knee scar from a tib-fib compressed fracture; left elbow fracture years ago also. HTN which is not well controlled now; likewise with diabetes, and pt. reports these are because of his treatment and are being monitored and adjusted.  Was in the hospital this past weekend x 2 days due to blood sugar at 500.                                                   Patient Stated Goals get energy back; get back feeling better   Currently in Pain? Yes   Pain Score 2    Pain Location Back   Pain Orientation Mid;Lower;Other (Comment)  central   Pain Descriptors / Indicators Tiring   Aggravating Factors  nothing   Pain Relieving Factors I don't know.            [facility] PT Assessment -  02/21/15 0001    Assessment   Medical Diagnosis nasopharyngeal cancer   Onset Date/Surgical Date 11/10/14  approx.   Precautions   Precautions Other (comment)  cancer precautions   Restrictions   Weight Bearing Restrictions No   Balance Screen   Has the patient fallen in the past 6 months No   Has the patient had a decrease in activity level because of a fear of falling?  No  but is careful in shower; looking for shower chair   Is the patient reluctant to leave their home because of a fear of falling?  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 [employer] did regular exercise including cardio classes; has exercise room at home; daughter is in fitness profession; wife also exercises   Observation/Other Assessments   Observations all teeth extracted   Sit to Stand   Comments 2 reps only, couldn't more due to leg weakness   Posture/Postural Control   Posture/Postural Control Postural limitations   Postural Limitations Forward head   ROM / Strength   AROM / PROM / Strength AROM;Strength   AROM   AROM Assessment Site Shoulder;Cervical;Lumbar   Right/Left Shoulder --  WFL bilat, but pulls in back   Cervical Flexion 3 finger widths chin to chest   Cervical Extension WFL   Cervical - Right Side Bend 50% loss   Cervical - Left Side Bend 50% loss   Cervical - Right Rotation 25% loss   Cervical - Left Rotation 25% loss   Lumbar Flexion reaches not quite to mid calf   Lumbar Extension 25+% loss   Lumbar - Right Side Bend WFL   Lumbar - Left Side Bend WFL   Lumbar - Right Rotation WFL   Lumbar - Left Rotation [facility]   Strength   Overall Strength Comments both LEs grossly 5-/5   Palpation   Palpation comment paraspinals at mid to low back do not feel unusually tight   Special Tests    Special Tests Lumbar   Lumbar Tests other;other2   other   Findings Negative   Side  --  right and left   Comments SKTC negative with full ROM   other   Findings Negative   Comment repeated flexion in standing and repeated extension in standing x 10 each made no change in back pain  shortness of breath on doing these repetitions   6 minute walk test results    Aerobic Endurance Distance Walked 245   Endurance additional comments could walk only 1 min. 33 seconds; stopped by back pain and some fatigue           LYMPHEDEMA/ONCOLOGY QUESTIONNAIRE - 02/21/15 1130    What other symptoms do you have   Are you having pitting edema No   Other Symptoms no visible edema; neck tissue is soft   Lymphedema  Assessments   Lymphedema Assessments --  has lost 42 lbs. during treatment   Head and Neck   4 cm superior to sternal notch around neck 35 cm   6 cm superior to sternal notch around neck 35.3 cm   8 cm superior to sternal notch  around neck 37.8 cm                                Long Term Clinic Goals - 03-17-15 1215    CC Long Term Goal  #1   Title Pt. will be independent in home exercise program for endurance and increased functional strength and knowledgeable about safe self-progression   Time 4   Period Weeks   Status New   CC Long Term Goal  #2   Title Patient will be able to walk at least 6 minutes without needing to stop due to back pain or fatigue   Time 4   Period Weeks   Status New   CC Long Term Goal  #3   Title Patient will report back pain improved by at least 50%   Time 4   Period Weeks   Status New   CC Long Term Goal  #4   Title Patient will report at least 25% perceived improvement in function related to decreased fatigue   Time 4   Period Weeks   Status New         Head and Neck Clinic Goals - 12/11/14 1658    Patient will be able to verbalize understanding of a home exercise program for cervical range of motion, posture, and walking.    Status Achieved   Patient will be able to verbalize understanding of proper sitting and standing posture.    Status Achieved   Patient will be able to verbalize understanding of lymphedema risk and availability of treatment for this condition.    Status Achieved           Plan - 2015/03/17 1204    Clinical Impression Statement Patient now s/p chemoradiation for nasopharyngeal cancer and having trouble with low energy level and back pain.  He went from doing 27 repetitions of sit to/from stand in 30 seconds prior to treatment to being able to do that only two repetitions today.  He walked 1 minute 33 seconds, 245 feet in a walk test, then needed to stop due to back pain and some  fatigue.  Patient mildly short of breath after doing 10 repetitions of standing lumbar flexion and 10 of lumbar extension.   Pt will benefit from skilled therapeutic intervention in order to improve on the following deficits Decreased activity tolerance;Pain;Decreased range of motion;Impaired perceived functional ability;Postural dysfunction;Decreased endurance   Rehab Potential Excellent   PT Frequency 2x / week   PT Duration 4 weeks   PT Treatment/Interventions Therapeutic exercise;Patient/family education;Moist Heat;Electrical Stimulation;Manual techniques   PT Next Visit Plan begin endurance exercise such as bike or NuStep; neck and scapular retraction for improved posture; leg strengthening exercises; HEP instruction; back flexibility exercises; consider hot pack and/or electrical stimulation for mid to lower back pain.                                  Consulted and Agree with Plan of Care Patient          G-Codes - 03/17/2015 11-02-16    Functional Assessment Tool Used 2 minute walk test   Functional Limitation Mobility: Walking and moving around   Mobility: Walking and Moving Around Current Status [phone]) At least 60 percent but less than 80 percent impaired, limited or restricted  could walk only 1 1/2 minutes before stopping   Mobility:  Walking and Moving Around Goal Status [phone]) At least 20 percent but less than 40 percent impaired, limited or restricted       Problem List Patient Active Problem List   Diagnosis Date Noted  . Salivary gland infection 02/14/2015  . Constipation due to opioid therapy 02/06/2015  . Rash, skin 02/03/2015  . Leukopenia due to antineoplastic chemotherapy 01/30/2015  . Protein calorie malnutrition 01/24/2015  . Mucositis due to chemotherapy 01/24/2015  . Thrombocytopenia due to drugs 01/17/2015  . Bilateral lower extremity edema 01/17/2015  . Hyperkalemia 01/14/2015  . Anemia due to antineoplastic chemotherapy 01/07/2015  . Hyperglycemia due to  type 2 diabetes mellitus 01/07/2015  . Chronic anxiety 12/19/2014  . 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 Type 2 diabetes mellitus, controlled, with renal complications 20/80/2233  . S/P gastrostomy 12/19/2014  . Nasopharyngeal cancer 11/26/2014    Adam Yang 02/21/2015, 12:21 PM  [place], [place], [address] Phone: [phone]   Fax:  [place], PT 02/21/2015 12:21 PM

## 2015-02-24 ENCOUNTER — Ambulatory Visit: Payer: Medicare Other

## 2015-02-24 ENCOUNTER — Encounter: Payer: Self-pay | Admitting: Hematology and Oncology

## 2015-02-24 DIAGNOSIS — M545 Low back pain, unspecified: Secondary | ICD-10-CM

## 2015-02-24 DIAGNOSIS — M2560 Stiffness of unspecified joint, not elsewhere classified: Secondary | ICD-10-CM

## 2015-02-24 DIAGNOSIS — R5381 Other malaise: Secondary | ICD-10-CM

## 2015-02-24 DIAGNOSIS — R1310 Dysphagia, unspecified: Secondary | ICD-10-CM

## 2015-02-24 DIAGNOSIS — M436 Torticollis: Secondary | ICD-10-CM

## 2015-02-24 NOTE — Patient Instructions (Addendum)
ABDUCTION: Standing (Active)   Stand, feet flat. Lift right leg out to side. Use _1-2__ lbs. Complete _1-2__ sets of _10__ repetitions. Perform _2__ sessions per day.  http://gtsc.exer.us/110   Copyright  VHI. All rights reserved.  Flexion and Extension - Standing   Stand and support self while swinging uninvolved leg and hip forward 10 reps, then backward _10__ times each. Repeat with other leg and hip. Perform 1-2 sets of 10 reps in each direction. Do _2__ times per day.  Copyright  VHI. All rights reserved.

## 2015-02-24 NOTE — Therapy (Signed)
[place], [place], [address] Phone: [phone]   Fax:  [phone]  Physical Therapy Treatment  Patient Details  Name: Adam Yang MRN: [id number] Date of Birth: Nov 16, 1945 Referring Provider:  Heath Lark, MD  Encounter Date: 02/24/2015      PT End of Session - 02/24/15 1018    Visit Number 2   Number of Visits 9   Date for PT Re-Evaluation 03/28/15   PT Start Time 0939   PT Stop Time 1021   PT Time Calculation (min) 42 min   Activity Tolerance Patient limited by fatigue;Patient tolerated treatment well   Behavior During Therapy [facility] for tasks assessed/performed      Past Medical History  Diagnosis Date  . Hyperlipidemia   . Hypertension   . Complication of anesthesia   . PONV (postoperative nausea and vomiting)   . Diabetes mellitus without complication     Type II  . Chronic kidney disease     Renal Insuffiency , Creatinine has been in the 2- 3 range, 11/27/14 Creatine 1.4  . Gout   . Arthritis   . Anxiety     Claustrophobia  . Cancer     Past Surgical History  Procedure Laterality Date  . Lymph node biopsy    . Colonoscopy    . Knee surgery Left 1990's    tibia crushed, pinned and bone grafted  . Colonoscopy w/ polypectomy    . Multiple extractions with alveoloplasty N/A 12/06/2014    Procedure: Extraction of tooth #'s 2,4,5,6,7,8,9,10,11,12,13,14,15, 20,22,26,27,28,29 with alveoloplasty.;  Surgeon: Lenn Cal, DDS;  Location: [facility];  Service: Oral Surgery;  Laterality: N/A;    There were no vitals filed for this visit.  Visit Diagnosis:  Physical deconditioning  Stiffness of joints, multiple sites  Midline low back pain without sciatica  Stiffness of neck      Subjective Assessment - 02/24/15 0941    Subjective Just feel my same tired and dizziness.    Currently in Pain? No/denies                         [facility] Adult PT Treatment/Exercise -  02/24/15 0001    Lumbar Exercises: Stretches   Passive Hamstring Stretch 2 reps;10 seconds  Used gait belt around ball of foot   Single Knee to Chest Stretch 2 reps;30 seconds   Lower Trunk Rotation 3 reps;20 seconds   Pelvic Tilt Other (comment)  10 reps, 5 seconds   Pelvic Tilt Limitations Able to return correct demo after tactile cuing   Piriformis Stretch 2 reps;10 seconds  Used towel under opposite knee   Lumbar Exercises: Aerobic   Stationary Bike Level 1, 2:30 mins   Lumbar Exercises: Seated   Long Arc Quad on Chair --   LAQ on Chair Weights (lbs) --   Lumbar Exercises: Supine   Clam 5 reps  3 sets, with pelvic tilt   Bent Knee Raise 5 reps  2 sets   Bent Knee Raise Limitations Moderate verbal cuing for correct breathing with exercises   Knee/Hip Exercises: Standing   Hip Flexion Stengthening;Both;2 sets;10 reps;Knee straight  1 lb each ankle   Hip Abduction Stengthening;Both;2 sets;10 reps;Knee straight  1 lb each ankle   Hip Extension Stengthening;Both;2 sets;10 reps;Knee straight  1 lb each ankle   Extension Limitations Required seated rests between sets   Knee/Hip Exercises: Seated   Long Arc Quad Strengthening;Both;10 reps;Weights   Long  Arc Quad Weight 1 lbs.   Ball Squeeze Small orange ball 2 sets of 10 reps   Clamshell with TheraBand Red  20 reps   Marching Strengthening;Both;10 reps   Shoulder Exercises: Seated   Retraction Strengthening;Both;10 reps;Theraband   Theraband Level (Shoulder Retraction) Level 2 (Red)   Retraction Limitations Cuing throughout for correct technique, slow down and to breath properly during   Modalities   Modalities Moist Heat   Moist Heat Therapy   Number Minutes Moist Heat 20 Minutes   Moist Heat Location Lumbar Spine  Low-mid back during all supine lumbar stretches   Neck Exercises: Stretches   Upper Trapezius Stretch 2 reps;20 seconds  Bil seated in chair   Levator Stretch 2 reps;20 seconds  Bil seated in chair                 PT Education - 02/24/15 1029    Education provided Yes   Education Details Standing hip LE strength   Person(s) Educated Patient   Methods Explanation;Demonstration;Handout   Comprehension Verbalized understanding;Returned demonstration;Need further instruction  Cuing for proper breathing                Long Term Clinic Goals - 02/24/15 1030    CC Long Term Goal  #1   Title Pt. will be independent in home exercise program for endurance and increased functional strength and knowledgeable about safe self-progression   Status On-going   CC Long Term Goal  #2   Title Patient will be able to walk at least 6 minutes without needing to stop due to back pain or fatigue   Status On-going   CC Long Term Goal  #3   Title Patient will report back pain improved by at least 50%   Status On-going   CC Long Term Goal  #4   Title Patient will report at least 25% perceived improvement in function related to decreased fatigue   Status On-going         Head and Neck Clinic Goals - 12/11/14 1658    Patient will be able to verbalize understanding of a home exercise program for cervical range of motion, posture, and walking.    Status Achieved   Patient will be able to verbalize understanding of proper sitting and standing posture.    Status Achieved   Patient will be able to verbalize understanding of lymphedema risk and availability of treatment for this condition.    Status Achieved           Plan - 02/24/15 1019    Clinical Impression Statement Pt tolerated treatment well overall, though did start having LBP so finished treatment supine on heat. Pt does require moderate verbal cuing for correct breathing throughout exercises but is able to return correct demo and is doing well with new exercises and technique.    Pt will benefit from skilled therapeutic intervention in order to improve on the following deficits Decreased activity  tolerance;Pain;Decreased range of motion;Impaired perceived functional ability;Postural dysfunction;Decreased endurance   Rehab Potential Excellent   PT Frequency 2x / week   PT Duration 4 weeks   PT Treatment/Interventions Therapeutic exercise;Patient/family education;Moist Heat;Electrical Stimulation;Manual techniques   PT Next Visit Plan Continue endurance exercise such as bike or NuStep; neck and scapular retraction for improved posture; leg strengthening exercises; HEP instruction; back flexibility exercises; hot pack and/or electrical stimulation for mid to lower back pain.  Consulted and Agree with Plan of Care Patient        Problem List Patient Active Problem List   Diagnosis Date Noted  . Salivary gland infection 02/14/2015  . Constipation due to opioid therapy 02/06/2015  . Rash, skin 02/03/2015  . Leukopenia due to antineoplastic chemotherapy 01/30/2015  . Protein calorie malnutrition 01/24/2015  . Mucositis due to chemotherapy 01/24/2015  . Thrombocytopenia due to drugs 01/17/2015  . Bilateral lower extremity edema 01/17/2015  . Hyperkalemia 01/14/2015  . Anemia due to antineoplastic chemotherapy 01/07/2015  . Hyperglycemia due to type 2 diabetes mellitus 01/07/2015  . Chronic anxiety 12/19/2014  . 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 Type 2 diabetes mellitus, controlled, with renal complications 54/56/2563  . S/P gastrostomy 12/19/2014  . Nasopharyngeal cancer 11/26/2014    Otelia Limes, PTA 02/24/2015, 10:31 AM  [place], [place], [address] Phone: [phone]   Fax:  [phone]

## 2015-02-24 NOTE — Patient Instructions (Signed)
You have to complete the exercises at least twice a day in order to afford yourself the best possible chance to maintain normal swallowing  Doing the exercises will help keep your muscles soft and no become hard and fibrotic.

## 2015-02-24 NOTE — Therapy (Signed)
[facility] [address] [place], [place], [address] Phone: [phone]   Fax:  [phone]  Speech Language Pathology Treatment  Patient Details  Name: Adam Yang MRN: [id number] Date of Birth: [birth year] Referring Provider:  Gildardo Cranker, MD  Encounter Date: 02/24/2015      End of Session - 02/24/15 1606    Visit Number 2   Number of Visits 3   Date for SLP Re-Evaluation 04/25/15   SLP Start Time 1455   SLP Stop Time  1524   SLP Time Calculation (min) 29 min   Activity Tolerance Patient tolerated treatment well      Past Medical History  Diagnosis Date  . Hyperlipidemia   . Hypertension   . Complication of anesthesia   . PONV (postoperative nausea and vomiting)   . Diabetes mellitus without complication     Type II  . Chronic kidney disease     Renal Insuffiency , Creatinine has been in the 2- 3 range, 11/27/14 Creatine 1.4  . Gout   . Arthritis   . Anxiety     Claustrophobia  . Cancer     Past Surgical History  Procedure Laterality Date  . Lymph node biopsy    . Colonoscopy    . Knee surgery Left 1990's    tibia crushed, pinned and bone grafted  . Colonoscopy w/ polypectomy    . Multiple extractions with alveoloplasty N/A 12/06/2014    Procedure: Extraction of tooth #'s 2,4,5,6,7,8,9,10,11,12,13,14,15, 20,22,26,27,28,29 with alveoloplasty.;  Surgeon: Charlynne Pander, DDS;  Location: [facility];  Service: Oral Surgery;  Laterality: N/A;    There were no vitals filed for this visit.  Visit Diagnosis: Dysphagia      Subjective Assessment - 02/24/15 1506    Subjective Pt hsa completed swallow HEP "Maybe once a day". SLP stressed copmletion as prescribed will afford pt best opportunity for maintaining WNL swallowing.               ADULT SLP TREATMENT - 02/24/15 1510    General Information   Behavior/Cognition Alert;Cooperative;Pleasant mood   Treatment Provided   Treatment provided  Dysphagia   Dysphagia Treatment   Temperature Spikes Noted No   Respiratory Status Room air   Oral Cavity - Dentition Edentulous   Treatment Methods Skilled observation;Therapeutic exercise   Patient observed directly with PO's Yes   Type of PO's observed Thin liquids   Oral Phase Signs & Symptoms --  none noted today   Pharyngeal Phase Signs & Symptoms --  none noted today   Type of cueing Verbal;Visual   Amount of cueing Moderate   Other treatment/comments Pt with POs only if topical anesthetic ("magic mouthwash") prior. SLP req'd to provide mod cues occasionally today (breath hold, chin pushback, shaker)           SLP Education - 02/24/15 1605    Education provided Yes   Education Details HEP, rationale for HEP, need to complete as prescribed, need for POs due to PEG tube   Person(s) Educated Patient   Methods Explanation;Demonstration;Verbal cues;Handout   Comprehension Verbalized understanding;Returned demonstration;Verbal cues required          SLP Short Term Goals - 02/24/15 1608    SLP SHORT TERM GOAL #1   Title pt will complete HEP with rare verbal cues   Baseline renewed 02-24-15   Time 1   Period --  visit   Status Not Met   SLP SHORT TERM GOAL #2  Title pt will tell SLP why he is completing HEP   Baseline renewed 02-24-15   Time 1   Period --  visit   Status Not Met          SLP Long Term Goals - 02/24/15 [place] #1   Title pt will tell SLP signs/symptoms aspiration PNA   Baseline renewed 02-24-15   Time 2   Period --  visits   Status Not Met   SLP LONG TERM GOAL #2   Title pt will complete HEP wtih modified independence   Baseline renewed 02-24-15   Time 2   Period --  visits   Status Not Met          Plan - 02/24/15 1607    Clinical Impression Statement Pt presents with swallowing essentially WNL at present. However pt will need skilled ST to monitor safety with POs during and after rad tx, and assessment of success  with HEP procedure. Pt did not meet any STGs or LTGs due to not seen since evaluation in late May 2016.   Speech Therapy Frequency --  approx every 4 weeks   Duration --  for 60 days   Treatment/Interventions Aspiration precaution training;Diet toleration management by SLP;Pharyngeal strengthening exercises;Oral motor exercises;Patient/family education;Compensatory strategies;SLP instruction and feedback   Potential to Achieve Goals Good   Potential Considerations Cooperation/participation level   SLP Home Exercise Plan Reviewed today   Consulted and Agree with Plan of Care Patient        Problem List Patient Active Problem List   Diagnosis Date Noted  . Salivary gland infection 02/14/2015  . Constipation due to opioid therapy 02/06/2015  . Rash, skin 02/03/2015  . Leukopenia due to antineoplastic chemotherapy 01/30/2015  . Protein calorie malnutrition 01/24/2015  . Mucositis due to chemotherapy 01/24/2015  . Thrombocytopenia due to drugs 01/17/2015  . Bilateral lower extremity edema 01/17/2015  . Hyperkalemia 01/14/2015  . Anemia due to antineoplastic chemotherapy 01/07/2015  . Hyperglycemia due to type 2 diabetes mellitus 01/07/2015  . Chronic anxiety 12/19/2014  . 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 Type 2 diabetes mellitus, controlled, with renal complications 56/38/7564  . S/P gastrostomy 12/19/2014  . Nasopharyngeal cancer 11/26/2014    [facility] , [place], [place]  02/24/2015, 4:10 PM  [place] [address] [place], [place], [address] Phone: [phone]   Fax:  [phone]

## 2015-02-24 NOTE — Progress Notes (Signed)
Faxed fmla form to [phone]

## 2015-02-27 ENCOUNTER — Telehealth: Payer: Self-pay | Admitting: Hematology and Oncology

## 2015-02-27 ENCOUNTER — Encounter: Payer: Self-pay | Admitting: Hematology and Oncology

## 2015-02-27 ENCOUNTER — Encounter: Payer: Self-pay | Admitting: *Deleted

## 2015-02-27 ENCOUNTER — Ambulatory Visit (HOSPITAL_BASED_OUTPATIENT_CLINIC_OR_DEPARTMENT_OTHER): Payer: Medicare Other | Admitting: Hematology and Oncology

## 2015-02-27 VITALS — BP 112/60 | HR 116 | Temp 97.7°F | Resp 18 | Ht 68.0 in | Wt 146.9 lb

## 2015-02-27 DIAGNOSIS — I952 Hypotension due to drugs: Secondary | ICD-10-CM

## 2015-02-27 DIAGNOSIS — K1231 Oral mucositis (ulcerative) due to antineoplastic therapy: Secondary | ICD-10-CM | POA: Diagnosis not present

## 2015-02-27 DIAGNOSIS — E1122 Type 2 diabetes mellitus with diabetic chronic kidney disease: Secondary | ICD-10-CM

## 2015-02-27 DIAGNOSIS — C119 Malignant neoplasm of nasopharynx, unspecified: Secondary | ICD-10-CM | POA: Diagnosis not present

## 2015-02-27 DIAGNOSIS — E46 Unspecified protein-calorie malnutrition: Secondary | ICD-10-CM

## 2015-02-27 DIAGNOSIS — E875 Hyperkalemia: Secondary | ICD-10-CM

## 2015-02-27 DIAGNOSIS — N182 Chronic kidney disease, stage 2 (mild): Secondary | ICD-10-CM

## 2015-02-27 NOTE — Progress Notes (Signed)
  Oncology Nurse Navigator Documentation   Navigator Encounter Type: Clinic/MDC (02/27/15 0935)      To provide support and encouragement, care continuity and to assess for needs, met with patient during f/u appt with Dr. Alvy Bimler.  He was accompanied by his wife. 1. Wife reported blood sugars becoming better controlled, averaging 120s/130s in am, 350s mid-day, 200s in evening.  PCP increased Amaryl to 4 mg BID, initiated daily Actos 15 mg. 2. Eating better but still limited by continuing mouth/throat soreness.  Recently ate chicken and rice; eating salmon, pureed foods, chocolate protein shakes. 3. Noted taste buds are returning, I recognized the advanced timeframe with his being only 3 weeks post-tmt. 4. He reported to Dr. Alvy Bimler daily fluid intake, including 5-6 cans Glucerna daily with 500 cc FW with each can, frequent sips of water throughout day.  Dr. Alvy Bimler expressed concern that BP low, HR elevated, that he needs additional fluids.  He refused offered IVF, stated he will increase daily intake via PEG to ca twice current.  I supported wife in her statement that they will keep a daily log of intake.  Dr. Alvy Bimler further directed to monitor BP and HR with guidance to increase fluid if systolic <451, HR >460.  They verbalized understanding of information given. 5.    They understand I can be contacted with needs/conerns.   Gayleen Orem, RN, BSN, [facility] [phone]

## 2015-02-27 NOTE — Assessment & Plan Note (Signed)
He is on aggressive medical management with diabetes medicine under the care of his primary doctor. The patient had recurrent hyperkalemia with acute on chronic renal failure. Examination today revealed that he is tachycardic and hypotensive. The patient declined IV fluids. I recommend he increase free water flushes in between meals

## 2015-02-27 NOTE — Assessment & Plan Note (Signed)
The patient has completed all his treatment. Recent acute swelling on the right neck has resolved The patient is adamant he does not want further blood draw IV fluid support. I will recheck his blood work next month and see him back after that

## 2015-02-27 NOTE — Assessment & Plan Note (Signed)
He has persistent mucositis but discontinued his pain medicine himself He will continue Magic mouthwash as needed.

## 2015-02-27 NOTE — Telephone Encounter (Signed)
Gave adn printed appt sched and avs for pt for SEpt  °

## 2015-02-27 NOTE — Assessment & Plan Note (Signed)
He is using his feeding tube well and is able to tolerate 5-6 cans of nutritional supplement.  I recommend he increase water flushes

## 2015-02-27 NOTE — Progress Notes (Signed)
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   12/23/2014 - 02/03/2015 Chemotherapy  he received weekly cisplatin   12/23/2014 -  Radiation Therapy  he received radiation treatment   02/14/2015 Imaging CT Neck wo Contrast:  Marked involution previously noted LEFT nasopharyngeal mass.  Residual but improved bilateral cervical adenopathy representing metastatic disease.        INTERVAL HISTORY: Please see below for problem oriented charting. He returns for further follow-up. The patient self discontinued all pain medicine and only take Magic mouthwash and Tylenol as needed. He still have persistent and significant throat pain. He claims he is able to eat and drink significant amount of nutritional supplements by mouth and through feeding tube. The swelling around his neck and his legs had resolved. The patient felt that he is doing well and declined IV fluid support or further blood draw. He saw his primary care doctor last week which show some mild hyperkalemia. He denies any dizziness  or syncopal episode.  REVIEW OF SYSTEMS:   Constitutional: Denies fevers, chills or abnormal weight loss Eyes: Denies blurriness of vision Respiratory: Denies cough, dyspnea or wheezes Cardiovascular: Denies palpitation, chest discomfort Gastrointestinal:  Denies nausea, heartburn or change in bowel habits Skin: Denies abnormal skin rashes Lymphatics: Denies new lymphadenopathy or easy bruising Neurological:Denies numbness, tingling or new weaknesses Behavioral/Psych: Mood is stable, no new changes  All other systems were reviewed with the patient and are negative.  I have reviewed the past medical history, past surgical history, social history and family history with the patient and they are unchanged from previous note.  ALLERGIES:  has No Known Allergies.  MEDICATIONS:  Current Outpatient Prescriptions  Medication Sig Dispense Refill  . acetaminophen (TYLENOL) 500 MG tablet Take 500-1,000 mg by mouth every 6 (six) hours as needed for mild pain.    [place] Kitchen Alum & Mag Hydroxide-Simeth (MAGIC MOUTHWASH W/LIDOCAINE) SOLN Take 10 mLs by mouth 4 (four) times daily as needed for mouth pain. 500 mL 0  . amLODipine (NORVASC) 5 MG tablet Take 5 mg by mouth 2 (two) times daily.     Marland Kitchen antiseptic oral rinse (BIOTENE) LIQD 15 mLs by Mouth Rinse route as needed for dry mouth.    . diphenhydrAMINE (BENADRYL) 25 MG tablet Take 25 mg by mouth every 6 (six) hours as needed for allergies.     Mar[place] Kitchen emollient (BIAFINE) cream Apply 1 application topically daily.    Mar[place] Kitchen glimepiride (AMARYL) 4 MG tablet Take 4 mg by mouth daily with breakfast.    . LORazepam (ATIVAN) 1 MG tablet Take 1 tablet (1 mg total) by mouth 2 (two)  times daily as needed for anxiety. 40 tablet 0  . Multiple Vitamins-Minerals (CENTRUM SILVER PO) Take by mouth daily.    . Nutritional Supplements (FEEDING SUPPLEMENT, GLUCERNA 1.5 CAL,) LIQD Give 2 cans Glucerna 1.5 once daily via PEG and 1.5 cans TID with 150 cc free water before and after each  bolus feeding. Total 6.5 cans daily-100% nutrition needs via PEG. 6.5 Can   . Omega-3 Fatty Acids (FISH OIL PO) Take by mouth daily.    . ondansetron (ZOFRAN) 8 MG tablet Take 1 tablet (8 mg total) by mouth every 8 (eight) hours as needed. 60 tablet 1  . oxyCODONE-acetaminophen (PERCOCET) 5-325 MG per tablet Take one or two tablets by mouth every 4-6 hours as needed for pain. 50 tablet 0  . pioglitazone (ACTOS) 15 MG tablet Take 15 mg by mouth daily.     No current facility-administered medications for this visit.    PHYSICAL EXAMINATION: ECOG PERFORMANCE STATUS: 1 - Symptomatic but completely ambulatory  Filed Vitals:   02/27/15 0928  BP: 112/60  Pulse: 116  Temp: 97.7 F (36.5 C)  Resp: 18   Filed Weights   02/27/15 0928  Weight: 146 lb 14.4 oz (66.633 kg)    GENERAL:alert, no distress and comfortable. He looks thin and mildly cachectic. He looks pale SKIN: skin color, texture, turgor are normal, no rashes or significant lesions EYES: normal, Conjunctiva are pink and non-injected, sclera clear OROPHARYNX: persistent, significant ulceration is seen. No  thrush NECK:  Prior neck swelling has resolved LYMPH:  no palpable lymphadenopathy in the cervical, axillary or inguinal LUNGS: clear to auscultation and percussion with normal breathing effort HEART: regular rate & rhythm and no murmurs and no lower extremity edema ABDOMEN:abdomen soft, non-tender and normal bowel sounds. Feeding tube site looks okay Musculoskeletal:no cyanosis of digits and no clubbing  NEURO: alert & oriented x 3 with fluent speech, no focal motor/sensory deficits  LABORATORY DATA:  I have reviewed the data as listed    Component Value Date/Time   NA 137 02/16/2015 0400   NA 128* 02/14/2015 1329   K 4.1 02/16/2015 0400   K 6.7* 02/14/2015 1329   CL 102 02/16/2015 0400   CO2 28 02/16/2015 0400   CO2 27 02/14/2015 1329   GLUCOSE 102* 02/16/2015 0400   GLUCOSE 598* 02/14/2015 1329   BUN 30*  02/16/2015 0400   BUN 58.8* 02/14/2015 1329   CREATININE 0.97 02/16/2015 0400   CREATININE 1.8* 02/14/2015 1329   CALCIUM 7.6* 02/16/2015 0400   CALCIUM 9.3 02/14/2015 1329   PROT 6.5 02/14/2015 1329   ALBUMIN 2.2* 02/14/2015 1329   AST 13 02/14/2015 1329   ALT 26 02/14/2015 1329   ALKPHOS 94 02/14/2015 1329   BILITOT 0.37 02/14/2015 1329   GFRNONAA >60 02/16/2015 0400   GFRAA >60 02/16/2015 0400    No results found for: SPEP, UPEP  Lab Results  Component Value Date   WBC 6.0 02/15/2015   NEUTROABS 9.6* 02/14/2015   HGB 10.2* 02/15/2015   HCT 30.8* 02/15/2015   MCV 82.6 02/15/2015   PLT 151 02/15/2015      Chemistry      Component Value Date/Time   NA 137 02/16/2015 0400   NA 128* 02/14/2015 1329   K 4.1 02/16/2015 0400   K 6.7* 02/14/2015 1329   CL 102 02/16/2015 0400   CO2 28 02/16/2015 0400   CO2 27 02/14/2015 1329   BUN 30* 02/16/2015 0400   BUN 58.8* 02/14/2015 1329  CREATININE 0.97 02/16/2015 0400   CREATININE 1.8* 02/14/2015 1329      Component Value Date/Time   CALCIUM 7.6* 02/16/2015 0400   CALCIUM 9.3 02/14/2015 1329   ALKPHOS 94 02/14/2015 1329   AST 13 02/14/2015 1329   ALT 26 02/14/2015 1329   BILITOT 0.37 02/14/2015 1329     ASSESSMENT & PLAN:  Nasopharyngeal cancer The patient has completed all his treatment. Recent acute swelling on the right neck has resolved The patient is adamant he does not want further blood draw IV fluid support. I will recheck his blood work next month and see him back after that   Protein calorie malnutrition He is using his feeding tube well and is able to tolerate 5-6 cans of nutritional supplement.  I recommend he increase water flushes   Mucositis due to chemotherapy He has persistent mucositis but discontinued his pain medicine himself He will continue Magic mouthwash as needed.      Stage 2 chronic renal impairment associated with type 2 diabetes mellitus  He is on aggressive medical management  with diabetes medicine under the care of his primary doctor. The patient had recurrent hyperkalemia with acute on chronic renal failure. Examination today revealed that he is tachycardic and hypotensive. The patient declined IV fluids. I recommend he increase free water flushes in between meals  Hypotension due to drugs  This is likely dehydration until proven otherwise. I recommend he discontinue amlodipine and monitor blood pressure at home closely. If his systolic blood pressures less than 100 or he remained tachycardic with heart rate greater than 100, he will call us for further evaluation and management   Orders Placed This Encounter  Procedures  . CBC with Differential/Platelet    Standing Status: Future     Number of Occurrences:      Standing Expiration Date: 04/02/2016  . Comprehensive metabolic panel    Standing Status: Future     Number of Occurrences:      Standing Expiration Date: 04/02/2016   All questions were answered. The patient knows to call the clinic with any problems, questions or concerns. No barriers to learning was detected. I spent 20 minutes counseling the patient face to face. The total time spent in the appointment was 30 minutes and more than 50% was on counseling and review of test results     [facility], Damari Suastegui, MD 02/27/2015 10:24 AM

## 2015-02-27 NOTE — Assessment & Plan Note (Signed)
This is likely dehydration until proven otherwise. I recommend he discontinue amlodipine and monitor blood pressure at home closely. If his systolic blood pressures less than 100 or he remained tachycardic with heart rate greater than 100, he will call us for further evaluation and management

## 2015-03-03 ENCOUNTER — Ambulatory Visit: Payer: Self-pay

## 2015-03-04 ENCOUNTER — Ambulatory Visit: Payer: Medicare Other | Admitting: Physical Therapy

## 2015-03-04 DIAGNOSIS — M545 Low back pain, unspecified: Secondary | ICD-10-CM

## 2015-03-04 DIAGNOSIS — R5381 Other malaise: Secondary | ICD-10-CM

## 2015-03-04 DIAGNOSIS — M436 Torticollis: Secondary | ICD-10-CM

## 2015-03-04 DIAGNOSIS — M2560 Stiffness of unspecified joint, not elsewhere classified: Secondary | ICD-10-CM

## 2015-03-04 NOTE — Therapy (Signed)
[place], [place], [address] Phone: [phone]   Fax:  [phone]  Physical Therapy Treatment  Patient Details  Name: Adam Yang MRN: [id number] Date of Birth: [birth year] Referring Provider:  Heath Lark, MD  Encounter Date: 03/04/2015      PT End of Session - 03/04/15 1717    Visit Number 3   Number of Visits 9   Date for PT Re-Evaluation 03/28/15   PT Start Time 5361   PT Stop Time 1602   PT Time Calculation (min) 41 min   Activity Tolerance Patient limited by fatigue;Patient tolerated treatment well   Behavior During Therapy [facility] for tasks assessed/performed      Past Medical History  Diagnosis Date  . Hyperlipidemia   . Hypertension   . Complication of anesthesia   . PONV (postoperative nausea and vomiting)   . Diabetes mellitus without complication     Type II  . Chronic kidney disease     Renal Insuffiency , Creatinine has been in the 2- 3 range, 11/27/14 Creatine 1.4  . Gout   . Arthritis   . Anxiety     Claustrophobia  . Cancer     Past Surgical History  Procedure Laterality Date  . Lymph node biopsy    . Colonoscopy    . Knee surgery Left 1990's    tibia crushed, pinned and bone grafted  . Colonoscopy w/ polypectomy    . Multiple extractions with alveoloplasty N/A 12/06/2014    Procedure: Extraction of tooth #'s 2,4,5,6,7,8,9,10,11,12,13,14,15, 20,22,26,27,28,29 with alveoloplasty.;  Surgeon: Lenn Cal, DDS;  Location: [facility];  Service: Oral Surgery;  Laterality: N/A;    There were no vitals filed for this visit.  Visit Diagnosis:  Physical deconditioning  Stiffness of joints, multiple sites  Midline low back pain without sciatica  Stiffness of neck      Subjective Assessment - 03/04/15 1523    Subjective "I'm improving."  It's in my mouth--it's not burning quite as bad; I'm eating more.  "When my mouth's not burning, I feel like a new man.  Today's a  good day."  Doing exercise with resistance everyday at home.  Doing some Pilates stuff and some stretches for his back.                         [facility] Adult PT Treatment/Exercise - 03/04/15 0001    Lumbar Exercises: Aerobic   Stationary Bike Level 1 x 5 mins.  min.-moderate dyspnea after   UBE (Upper Arm Bike) level 1, 2 mins. forward, 1 min. back (fatigue with going back)   Lumbar Exercises: Standing   Other Standing Lumbar Exercises hold physioball out with arms, partial squat, then straighten knees and arms up overhead while engaging core x 6 (difficult)   Knee/Hip Exercises: Standing   Heel Raises Both;20 reps   Knee Flexion Strengthening;Both;2 sets;10 reps  2 lbs.   Hip Flexion Stengthening;Both;2 sets;10 reps;Knee straight  2 lbs.   Hip Abduction Stengthening;Both;2 sets;10 reps;Knee straight  2 lbs.   Hip Extension Stengthening;Both;2 sets;10 reps;Knee straight  mild-mod. dyspnea after flex and ext of hips   Other Standing Knee Exercises sit to stand without UE support x 4 (ltd. by fatigue)   Knee/Hip Exercises: Seated   Long Arc Quad Strengthening;Both;2 sets;10 reps;Weights   Long Arc Quad Weight 2 lbs.   Marching Strengthening;Both;2 sets;10 reps;Weights   Marching Weights 2 lbs.   Shoulder Exercises:  Seated   Other Seated Exercises 3 way arm raises, 2 lbs. x 10 each    Other Seated Exercises overhead press, 2 lbs. x 10 x 2   Shoulder Exercises: Standing   Other Standing Exercises wall pushups x 20                        Long Term Clinic Goals - 03/04/15 1721    CC Long Term Goal  #1   Title Pt. will be independent in home exercise program for endurance and increased functional strength and knowledgeable about safe self-progression   Status On-going   CC Long Term Goal  #2   Title Patient will be able to walk at least 6 minutes without needing to stop due to back pain or fatigue   Status On-going   CC Long Term Goal  #3   Title  Patient will report back pain improved by at least 50%   Status On-going   CC Long Term Goal  #4   Title Patient will report at least 25% perceived improvement in function related to decreased fatigue   Status On-going         Head and Neck Clinic Goals - 12/11/14 1658    Patient will be able to verbalize understanding of a home exercise program for cervical range of motion, posture, and walking.    Status Achieved   Patient will be able to verbalize understanding of proper sitting and standing posture.    Status Achieved   Patient will be able to verbalize understanding of lymphedema risk and availability of treatment for this condition.    Status Achieved           Plan - 03/04/15 1718    Clinical Impression Statement Patient did better today; no back pain, though some back fatigue; did do 4 repetitions of sit to/from stand without UE support whereas he could only do two at eval.  He did need monitored rest periods for mild to moderate dyspnea between sets of exercise.  Mood seems better today, and he did report having a good day today.  Patient did exercise regularly in the past and has exercise equipment at home so is able to workout there as well and has some knowledge of how to exercise safely.   Pt will benefit from skilled therapeutic intervention in order to improve on the following deficits Decreased activity tolerance;Pain;Decreased range of motion;Impaired perceived functional ability;Postural dysfunction;Decreased endurance   Rehab Potential Excellent   PT Frequency 2x / week   PT Duration 4 weeks   PT Treatment/Interventions Therapeutic exercise   PT Next Visit Plan Continue endurance exercise such as bike or NuStep; neck and scapular retraction for improved posture; leg strengthening exercises; HEP instruction; back flexibility exercises; hot pack and/or electrical stimulation for mid to lower back pain if needed.                                   Consulted and Agree with Plan of Care Patient        Problem List Patient Active Problem List   Diagnosis Date Noted  . Hypotension due to drugs 02/27/2015  . Salivary gland infection 02/14/2015  . Constipation due to opioid therapy 02/06/2015  . Rash, skin 02/03/2015  . Leukopenia due to antineoplastic chemotherapy 01/30/2015  . Protein calorie malnutrition 01/24/2015  . Mucositis due to chemotherapy 01/24/2015  .  Thrombocytopenia due to drugs 01/17/2015  . Bilateral lower extremity edema 01/17/2015  . Hyperkalemia 01/14/2015  . Anemia due to antineoplastic chemotherapy 01/07/2015  . Hyperglycemia due to type 2 diabetes mellitus 01/07/2015  . Chronic anxiety 12/19/2014  . 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 Type 2 diabetes mellitus, controlled, with renal complications 07/62/2633  . S/P gastrostomy 12/19/2014  . Nasopharyngeal cancer 11/26/2014    Jakera Beaupre 03/04/2015, 5:23 PM  [place], [place], [address] Phone: [phone]   Fax:  [place], PT 03/04/2015 5:23 PM

## 2015-03-05 ENCOUNTER — Telehealth: Payer: Self-pay | Admitting: *Deleted

## 2015-03-05 ENCOUNTER — Other Ambulatory Visit: Payer: Self-pay | Admitting: Radiation Oncology

## 2015-03-05 DIAGNOSIS — K1231 Oral mucositis (ulcerative) due to antineoplastic therapy: Secondary | ICD-10-CM

## 2015-03-05 MED ORDER — MAGIC MOUTHWASH W/LIDOCAINE: 10.0000 mL | Freq: Four times a day (QID) | ORAL | Status: DC | PRN

## 2015-03-05 NOTE — Telephone Encounter (Signed)
Called and spoke with wife and gave her the RX called in to [facility] pt pharmacy , but rx is MMW take 79ml only 4x day as needed,520ml only, per Dr. Valere Dross, wife not happy with this wants to discuss with Malachy Mood Dr. Margo Common nurse she wants MMW increased to 45ml  ,transferred  Call to Dr. Margo Common nurse Patric Dykes, RN to call her back

## 2015-03-05 NOTE — Telephone Encounter (Signed)
Wife requesting refill on MMW ,but to increase dosage amount to 42ml  And if can increase times to 6 instead of 4 per their pharmacy and so insurance will pay for this ,last written on the 18th by Isidore Moos, patient is using more than prescribed, he isn't using his fentanyl patch  Per wife and would like this called to pharmacy today, I will inbasket Dr. Valere Dross and have his nurse Patric Dykes, RN get back with you, wife stated he will run out of the MMW this weekend and that is what is helping him" 12:25 PM

## 2015-03-05 NOTE — Telephone Encounter (Signed)
Called Ms. Coby and explained that the Lidocaine in the Magic mouthwash can lead to cardiac arrhythmias(irregular heart beat) if used more times than allowed via the original prescription which was found to be the case today..  She stated that she understood, therefore suggested that until the new script can be picked up next week that Mr. [place] can resume his fentanyl patch, but this was not acceptable to her nor Mr. Domine.  Since she has been swabbing the back of his thoat with the magic mouthwash, suggested trying Maalox to coat his throat prior to eating to determine if this can alleviate some of his discomfort, and she stated she would try this.  Also suggested frequent gargling with baking soda without salt, for relief and she stated that she would try this as well.  Will call tomorrow to check his status.  Dr. Valere Dross informed of above.

## 2015-03-05 NOTE — Telephone Encounter (Signed)
Prescription refilled through [facility]. To only use 4 times a day to limit Xylocaine. Let patient know. Thanks, RM

## 2015-03-07 ENCOUNTER — Ambulatory Visit: Payer: Medicare Other | Admitting: Physical Therapy

## 2015-03-07 VITALS — BP 110/76 | HR 128

## 2015-03-07 DIAGNOSIS — M2560 Stiffness of unspecified joint, not elsewhere classified: Secondary | ICD-10-CM

## 2015-03-07 DIAGNOSIS — R5381 Other malaise: Secondary | ICD-10-CM

## 2015-03-07 NOTE — Therapy (Signed)
[place], [place], [address] Phone: [phone]   Fax:  [phone]  Physical Therapy Treatment  Patient Details  Name: Adam Yang MRN: [id number] Date of Birth: 1946/06/01 Referring Provider:  Heath Lark, MD  Encounter Date: 03/07/2015      PT End of Session - 03/07/15 1154    Visit Number 4   Number of Visits 9   Date for PT Re-Evaluation 03/28/15   PT Start Time 1106   PT Stop Time 1155   PT Time Calculation (min) 49 min   Activity Tolerance Patient limited by fatigue   Behavior During Therapy [facility] for tasks assessed/performed      Past Medical History  Diagnosis Date  . Hyperlipidemia   . Hypertension   . Complication of anesthesia   . PONV (postoperative nausea and vomiting)   . Diabetes mellitus without complication     Type II  . Chronic kidney disease     Renal Insuffiency , Creatinine has been in the 2- 3 range, 11/27/14 Creatine 1.4  . Gout   . Arthritis   . Anxiety     Claustrophobia  . Cancer     Past Surgical History  Procedure Laterality Date  . Lymph node biopsy    . Colonoscopy    . Knee surgery Left 1990's    tibia crushed, pinned and bone grafted  . Colonoscopy w/ polypectomy    . Multiple extractions with alveoloplasty N/A 12/06/2014    Procedure: Extraction of tooth #'s 2,4,5,6,7,8,9,10,11,12,13,14,15, 20,22,26,27,28,29 with alveoloplasty.;  Surgeon: Lenn Cal, DDS;  Location: [facility];  Service: Oral Surgery;  Laterality: N/A;    Filed Vitals:   03/07/15 1107  BP: 110/76  Pulse: 128  SpO2: 98%    Visit Diagnosis:  Physical deconditioning  Stiffness of joints, multiple sites      Subjective Assessment - 03/07/15 1107    Subjective "It's not a good day.  My mouth's bothering me, and that just zaps me."  Did get sore after last session, but worked it out with some exercise at home.   Currently in Pain? Yes   Pain Score 0-No pain  "not pain, it  just drains me; just blah"   Pain Location Mouth   Aggravating Factors  unknown   Pain Relieving Factors nothing; hope that it will get better                         [facility] Adult PT Treatment/Exercise - 03/07/15 0001    Lumbar Exercises: Aerobic   Stationary Bike NuStep L2, arms and legs x 1 min., then L5 x 1.5 mins. (step speed at approx. 75)   UBE (Upper Arm Bike) level 1, 2 mins backward (difficult), 2 mins forward (much easier)  rest needed between directions and after doing this   Lumbar Exercises: Standing   Other Standing Lumbar Exercises hold physioball out with arms, partial squat, then straighten knees and arms up overhead while engaging core x 6 (difficult)   Knee/Hip Exercises: Standing   Heel Raises Both;20 reps   Knee Flexion Strengthening;Both;2 sets;10 reps  2 lbs.   Hip Flexion Stengthening;Both;2 sets;10 reps;Knee straight  2 lbs.   Hip Abduction Stengthening;Both;2 sets;10 reps;Knee straight  2 lbs.   Hip Extension Stengthening;Both;2 sets;10 reps;Knee straight  mild-mod. dyspnea after flex and ext of hips   Shoulder Exercises: Seated   Other Seated Exercises 3 way arm raises, 2 lbs. x 10  each (fatiguing)                        Long Term Clinic Goals - 03/04/15 1721    CC Long Term Goal  #1   Title Pt. will be independent in home exercise program for endurance and increased functional strength and knowledgeable about safe self-progression   Status On-going   CC Long Term Goal  #2   Title Patient will be able to walk at least 6 minutes without needing to stop due to back pain or fatigue   Status On-going   CC Long Term Goal  #3   Title Patient will report back pain improved by at least 50%   Status On-going   CC Long Term Goal  #4   Title Patient will report at least 25% perceived improvement in function related to decreased fatigue   Status On-going         Head and Neck Clinic Goals - 12/11/14 1658    Patient will be  able to verbalize understanding of a home exercise program for cervical range of motion, posture, and walking.    Status Achieved   Patient will be able to verbalize understanding of proper sitting and standing posture.    Status Achieved   Patient will be able to verbalize understanding of lymphedema risk and availability of treatment for this condition.    Status Achieved           Plan - 03/07/15 1205    Clinical Impression Statement Patient not feeling as good today, with mouth pain that "zaps" him.  He did quite a bit of exercise but did c/o dizziness a couple of times, so sat down.  Blood pressure was 112/76, which he reported was low for him, and that he has been working to get his blood pressure, normally high, under control.  O2 sat was 98% and his heart rate increased to 128 with exercise.  Pt. recovered well with sitting rest for a few minutes.   Pt will benefit from skilled therapeutic intervention in order to improve on the following deficits Decreased activity tolerance;Pain;Decreased range of motion;Impaired perceived functional ability;Postural dysfunction;Decreased endurance   Rehab Potential Excellent   PT Frequency 2x / week   PT Duration 4 weeks   PT Treatment/Interventions Therapeutic exercise   PT Next Visit Plan Continue endurance exercise such as bike or NuStep; neck and scapular retraction for improved posture; leg strengthening exercises; HEP instruction; back flexibility exercises; hot pack and/or electrical stimulation for mid to lower back pain if needed.                                  Consulted and Agree with Plan of Care Patient        Problem List Patient Active Problem List   Diagnosis Date Noted  . Hypotension due to drugs 02/27/2015  . Salivary gland infection 02/14/2015  . Constipation due to opioid therapy 02/06/2015  . Rash, skin 02/03/2015  . Leukopenia due to antineoplastic chemotherapy 01/30/2015  . Protein calorie  malnutrition 01/24/2015  . Mucositis due to chemotherapy 01/24/2015  . Thrombocytopenia due to drugs 01/17/2015  . Bilateral lower extremity edema 01/17/2015  . Hyperkalemia 01/14/2015  . Anemia due to antineoplastic chemotherapy 01/07/2015  . Hyperglycemia due to type 2 diabetes mellitus 01/07/2015  . Chronic anxiety 12/19/2014  . Stage 2 chronic renal impairment associated with type  2 diabetes mellitus 12/19/2014  . Type 2 diabetes mellitus, controlled, with renal complications 16/04/9603  . S/P gastrostomy 12/19/2014  . 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

## 2015-03-10 ENCOUNTER — Telehealth: Payer: Self-pay | Admitting: *Deleted

## 2015-03-10 ENCOUNTER — Ambulatory Visit: Payer: Self-pay

## 2015-03-10 MED ORDER — MAGIC MOUTHWASH W/LIDOCAINE: 30.0000 mL | Freq: Four times a day (QID) | ORAL | Status: DC | PRN

## 2015-03-10 NOTE — Telephone Encounter (Signed)
Pls send in refill as requested

## 2015-03-10 NOTE — Telephone Encounter (Signed)
Wife asks for refill on MMW for pt..   Pharmacy says it is too soon to refill unless Dr. Alvy Bimler will send new RX w/ increase dose and or frequency.   Current dose states 15 ml QID.  Wife is using a mouth swab to administer which "soaks up half the medicine."   She asks for new Rx for 30 ml five times a day sent to [place].   She says pt is eating food as long as he can use the MMW before hand. He is only taking tylenol as needed for pain.

## 2015-03-11 ENCOUNTER — Ambulatory Visit: Payer: Medicare Other | Admitting: Physical Therapy

## 2015-03-11 VITALS — HR 120

## 2015-03-11 DIAGNOSIS — M2560 Stiffness of unspecified joint, not elsewhere classified: Secondary | ICD-10-CM

## 2015-03-11 DIAGNOSIS — R5381 Other malaise: Secondary | ICD-10-CM | POA: Diagnosis not present

## 2015-03-11 NOTE — Therapy (Signed)
[place], [place], [address] Phone: [phone]   Fax:  [phone]  Physical Therapy Treatment  Patient Details  Name: Adam Yang MRN: [id number] Date of Birth: 1946-02-18 Referring Provider:  Heath Lark, MD  Encounter Date: 03/11/2015      PT End of Session - 03/11/15 1446    Visit Number 5   Number of Visits 9   Date for PT Re-Evaluation 03/28/15   PT Start Time 1350   PT Stop Time 1761   PT Time Calculation (min) 47 min   Activity Tolerance Patient tolerated treatment well;Patient limited by fatigue   Behavior During Therapy [facility] for tasks assessed/performed      Past Medical History  Diagnosis Date  . Hyperlipidemia   . Hypertension   . Complication of anesthesia   . PONV (postoperative nausea and vomiting)   . Diabetes mellitus without complication     Type II  . Chronic kidney disease     Renal Insuffiency , Creatinine has been in the 2- 3 range, 11/27/14 Creatine 1.4  . Gout   . Arthritis   . Anxiety     Claustrophobia  . Cancer     Past Surgical History  Procedure Laterality Date  . Lymph node biopsy    . Colonoscopy    . Knee surgery Left 1990's    tibia crushed, pinned and bone grafted  . Colonoscopy w/ polypectomy    . Multiple extractions with alveoloplasty N/A 12/06/2014    Procedure: Extraction of tooth #'s 2,4,5,6,7,8,9,10,11,12,13,14,15, 20,22,26,27,28,29 with alveoloplasty.;  Surgeon: Lenn Cal, DDS;  Location: [facility];  Service: Oral Surgery;  Laterality: N/A;    Filed Vitals:   03/11/15 1350  Pulse: 120    Visit Diagnosis:  Physical deconditioning  Stiffness of joints, multiple sites      Subjective Assessment - 03/11/15 1350    Subjective Doing a little better today than the last time.  "I'm up to 150 pounds!"  (= up 3 lbs.)   Currently in Pain? Yes   Pain Score 2    Pain Location Throat   Pain Descriptors / Indicators Burning   Aggravating  Factors  unknown   Pain Relieving Factors numbing agent            [facility] PT Assessment - 03/11/15 0001    Sit to Stand   Comments 7 reps                     [facility] Adult PT Treatment/Exercise - 03/11/15 0001    Elbow Exercises   Elbow Flexion Strengthening;15 reps;Standing   Bar Weights/Barbell (Elbow Flexion) 5 lbs  could increase next time   Elbow Extension Strengthening;Standing;15 reps   Bar Weights/Barbell (Elbow Extension) 5 lbs  left was difficult   Lumbar Exercises: Aerobic   Stationary Bike NuStep L4, arms and legs x 5 mins. (step speed at approx. 75)   UBE (Upper Arm Bike) level 1, 2 mins backward, 3 mins forward (much easier)  rest needed between directions and after doing this   Lumbar Exercises: Standing   Other Standing Lumbar Exercises hold physioball out with arms, partial squat, then straighten knees and arms up overhead while engaging core x 10 (difficult)   Knee/Hip Exercises: Standing   Heel Raises Both;20 reps  2 lb. ankle weights   Knee Flexion Strengthening;Both;2 sets;10 reps  2 lbs.   Hip Flexion Stengthening;Both;2 sets;10 reps;Knee straight  2 lbs.   Hip  Abduction Stengthening;Both;2 sets;10 reps;Knee straight  2 lbs.   Hip Extension Stengthening;Both;2 sets;10 reps;Knee straight  2 lbs.   Other Standing Knee Exercises sit to stand without UE support x 7 (ltd. by fatigue)   Shoulder Exercises: Seated   Other Seated Exercises 3 way arm raises, 3 lbs. x 10 each (fatiguing)   Other Seated Exercises overhead press, 3 lbs. x 10 x 2   Shoulder Exercises: Standing   Other Standing Exercises wall pushups x 20                PT Education - 03/11/15 1445    Education provided Yes   Education Details info on [employer] [facility] [employer]) Educated Patient   Methods Explanation;Handout   Comprehension Verbalized understanding                [place] - 03/11/15 1448    CC Long Term Goal  #1   Title Pt.  will be independent in home exercise program for endurance and increased functional strength and knowledgeable about safe self-progression   Status On-going         Head and Neck Clinic Goals - 12/11/14 1658    Patient will be able to verbalize understanding of a home exercise program for cervical range of motion, posture, and walking.    Status Achieved   Patient will be able to verbalize understanding of proper sitting and standing posture.    Status Achieved   Patient will be able to verbalize understanding of lymphedema risk and availability of treatment for this condition.    Status Achieved           Plan - 03/11/15 1446    Clinical Impression Statement Patient having a better day today in terms of how he feels, and his performance matches that.  No c/o dizziness; mild-moderate dypnea with exercise, with O2 saturation at 98% and heart rate 120-125 bpm just after exercise.  Increase in number of sit to stands he can do, today at 7 (up from 4 and before that, 2).   Pt will benefit from skilled therapeutic intervention in order to improve on the following deficits Decreased activity tolerance;Pain;Decreased range of motion;Impaired perceived functional ability;Postural dysfunction;Decreased endurance   Rehab Potential Excellent   PT Frequency 2x / week   PT Duration 4 weeks   PT Treatment/Interventions Therapeutic exercise   PT Next Visit Plan Try 6 minute walk test to see if he can do it.Continue endurance exercise such as arm bike, recumbent bike or NuStep; try more weight machine exercise on ortho side; neck and scapular retraction for improved posture; leg strengthening exercises; HEP instruction; back flexibility exercises; hot pack and/or electrical stimulation for mid to lower back pain if needed.                                  Recommended Other Services [employer] [facility] [employer] and Agree with Plan of Care Patient        Problem List Patient  Active Problem List   Diagnosis Date Noted  . Hypotension due to drugs 02/27/2015  . Salivary gland infection 02/14/2015  . Constipation due to opioid therapy 02/06/2015  . Rash, skin 02/03/2015  . Leukopenia due to antineoplastic chemotherapy 01/30/2015  . Protein calorie malnutrition 01/24/2015  . Mucositis due to chemotherapy 01/24/2015  . Thrombocytopenia due to drugs 01/17/2015  . Bilateral  lower extremity edema 01/17/2015  . Hyperkalemia 01/14/2015  . Anemia due to antineoplastic chemotherapy 01/07/2015  . Hyperglycemia due to type 2 diabetes mellitus 01/07/2015  . Chronic anxiety 12/19/2014  . 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 Type 2 diabetes mellitus, controlled, with renal complications 29/52/8413  . S/P gastrostomy 12/19/2014  . 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

## 2015-03-14 ENCOUNTER — Ambulatory Visit: Payer: Medicare Other | Attending: Hematology and Oncology | Admitting: Physical Therapy

## 2015-03-14 DIAGNOSIS — R5381 Other malaise: Secondary | ICD-10-CM | POA: Diagnosis present

## 2015-03-14 DIAGNOSIS — R1310 Dysphagia, unspecified: Secondary | ICD-10-CM | POA: Insufficient documentation

## 2015-03-14 DIAGNOSIS — M545 Low back pain: Secondary | ICD-10-CM | POA: Insufficient documentation

## 2015-03-14 DIAGNOSIS — M2560 Stiffness of unspecified joint, not elsewhere classified: Secondary | ICD-10-CM | POA: Diagnosis present

## 2015-03-14 NOTE — Therapy (Signed)
[place], [place], [address] Phone: [phone]   Fax:  [phone]  Physical Therapy Treatment  Patient Details  Name: Adam Yang MRN: [id number] Date of Birth: [birth year] Referring Provider:  Heath Lark, MD  Encounter Date: 03/14/2015      PT End of Session - 03/14/15 1113    Visit Number 6   Number of Visits 9   Date for PT Re-Evaluation 03/28/15   PT Start Time 0931   PT Stop Time 1018   PT Time Calculation (min) 47 min   Activity Tolerance Patient tolerated treatment well   Behavior During Therapy [facility] for tasks assessed/performed      Past Medical History  Diagnosis Date  . Hyperlipidemia   . Hypertension   . Complication of anesthesia   . PONV (postoperative nausea and vomiting)   . Diabetes mellitus without complication     Type II  . Chronic kidney disease     Renal Insuffiency , Creatinine has been in the 2- 3 range, 11/27/14 Creatine 1.4  . Gout   . Arthritis   . Anxiety     Claustrophobia  . Cancer     Past Surgical History  Procedure Laterality Date  . Lymph node biopsy    . Colonoscopy    . Knee surgery Left 1990's    tibia crushed, pinned and bone grafted  . Colonoscopy w/ polypectomy    . Multiple extractions with alveoloplasty N/A 12/06/2014    Procedure: Extraction of tooth #'s 2,4,5,6,7,8,9,10,11,12,13,14,15, 20,22,26,27,28,29 with alveoloplasty.;  Surgeon: Lenn Cal, DDS;  Location: [facility];  Service: Oral Surgery;  Laterality: N/A;    There were no vitals filed for this visit.  Visit Diagnosis:  Physical deconditioning  Stiffness of joints, multiple sites      Subjective Assessment - 03/14/15 0929    Subjective "It's early for me.  I don't know how it's going yet."  Had some soreness at left neck and upper back the last two times; this was helped with having a massager on it for 10 minutes. [facility].    Currently in Pain? No/denies  just the normal mouth pain                         [facility] Adult PT Treatment/Exercise - 03/14/15 0001    High Level Balance   High Level Balance Comments single leg stance x 30 seconds each leg in corner (LOB more frequent on right than left; standing on foam cushjon with eyes open x 60 seconds, then eyes closed with close supervision x 30 seconds x 2; tandem stance x 30 seconds x 2   Elbow Exercises   Elbow Flexion Strengthening;15 reps;Standing   Bar Weights/Barbell (Elbow Flexion) 5 lbs  could increase next time   Elbow Extension Strengthening;Standing;15 reps   Bar Weights/Barbell (Elbow Extension) 5 lbs  left was difficult   Lumbar Exercises: Aerobic   Stationary Bike NuStep L4, arms and legs x 5.5 mins. (step speed at approx. 75)   UBE (Upper Arm Bike) level 1, 2.5 mins backward, 3 mins forward (much easier)  rest needed between directions and after doing this   Lumbar Exercises: Standing   Other Standing Lumbar Exercises hold physioball out with arms, partial squat, then straighten knees and arms up overhead while engaging core x 10 (difficult)   Knee/Hip Exercises: Standing   Lateral Step  Up Right;Left;Step Height: 6"   Forward Step Up Right;Left;10 reps;Step Height: 6"   Forward Step Up Limitations needs close supervision for imbalance   Step Down Right;Left;10 reps;Hand Hold: 1;Step Height: 4"   Shoulder Exercises: Seated   Other Seated Exercises 3 way arm raises, 3 lbs. x 10 each (fatiguing)                        Long Term Clinic Goals - 03/11/15 1448    CC Long Term Goal  #1   Title Pt. will be independent in home exercise program for endurance and increased functional strength and knowledgeable about safe self-progression   Status On-going         Head and Neck Clinic Goals - 12/11/14 1658    Patient will be able to verbalize understanding of a home exercise program for cervical range of motion,  posture, and walking.    Status Achieved   Patient will be able to verbalize understanding of proper sitting and standing posture.    Status Achieved   Patient will be able to verbalize understanding of lymphedema risk and availability of treatment for this condition.    Status Achieved           Plan - 03/14/15 1203    Clinical Impression Statement Patient is doing very well with his exercise program, increasing time or resistance on many exercises.  Today it was seen that he has balance deficits so some focus of exercise was on static standing exercises, which challenged him, as did step-ups and step-downs.  Overall, he is working hard and improving.   Pt will benefit from skilled therapeutic intervention in order to improve on the following deficits Decreased activity tolerance;Pain;Decreased range of motion;Impaired perceived functional ability;Postural dysfunction;Decreased endurance   Rehab Potential Excellent   PT Frequency 2x / week   PT Duration 4 weeks   PT Treatment/Interventions Therapeutic exercise   PT Next Visit Plan Try 6 minute walk test.  Continue endurance and strengthening, including neck and scapular retraction for improved posture.   Consulted and Agree with Plan of Care Patient        Problem List Patient Active Problem List   Diagnosis Date Noted  . Hypotension due to drugs 02/27/2015  . Salivary gland infection 02/14/2015  . Constipation due to opioid therapy 02/06/2015  . Rash, skin 02/03/2015  . Leukopenia due to antineoplastic chemotherapy 01/30/2015  . Protein calorie malnutrition 01/24/2015  . Mucositis due to chemotherapy 01/24/2015  . Thrombocytopenia due to drugs 01/17/2015  . Bilateral lower extremity edema 01/17/2015  . Hyperkalemia 01/14/2015  . Anemia due to antineoplastic chemotherapy 01/07/2015  . Hyperglycemia due to type 2 diabetes mellitus 01/07/2015  . Chronic anxiety 12/19/2014  . Stage 2 chronic renal impairment  associated with type 2 diabetes mellitus 12/19/2014  . Type 2 diabetes mellitus, controlled, with renal complications 58/03/9832  . S/P gastrostomy 12/19/2014  . 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

## 2015-03-18 ENCOUNTER — Ambulatory Visit (HOSPITAL_COMMUNITY): Payer: Self-pay | Admitting: Dentistry

## 2015-03-18 ENCOUNTER — Ambulatory Visit: Payer: Medicare Other | Admitting: Physical Therapy

## 2015-03-18 ENCOUNTER — Encounter (HOSPITAL_COMMUNITY): Payer: Self-pay | Admitting: Dentistry

## 2015-03-18 VITALS — BP 143/70 | HR 101 | Temp 98.1°F | Wt 150.0 lb

## 2015-03-18 DIAGNOSIS — R682 Dry mouth, unspecified: Secondary | ICD-10-CM

## 2015-03-18 DIAGNOSIS — Z5189 Encounter for other specified aftercare: Secondary | ICD-10-CM | POA: Diagnosis not present

## 2015-03-18 DIAGNOSIS — C119 Malignant neoplasm of nasopharynx, unspecified: Secondary | ICD-10-CM

## 2015-03-18 DIAGNOSIS — R5381 Other malaise: Secondary | ICD-10-CM | POA: Diagnosis not present

## 2015-03-18 DIAGNOSIS — Z0189 Encounter for other specified special examinations: Secondary | ICD-10-CM

## 2015-03-18 DIAGNOSIS — K0820 Unspecified atrophy of edentulous alveolar ridge: Secondary | ICD-10-CM

## 2015-03-18 DIAGNOSIS — M2560 Stiffness of unspecified joint, not elsewhere classified: Secondary | ICD-10-CM

## 2015-03-18 DIAGNOSIS — R432 Parageusia: Secondary | ICD-10-CM

## 2015-03-18 DIAGNOSIS — K08109 Complete loss of teeth, unspecified cause, unspecified class: Secondary | ICD-10-CM

## 2015-03-18 DIAGNOSIS — K1233 Oral mucositis (ulcerative) due to radiation: Secondary | ICD-10-CM

## 2015-03-18 DIAGNOSIS — R1310 Dysphagia, unspecified: Secondary | ICD-10-CM

## 2015-03-18 DIAGNOSIS — K117 Disturbances of salivary secretion: Secondary | ICD-10-CM

## 2015-03-18 NOTE — Therapy (Signed)
[place], [place], [address] Phone: [phone]   Fax:  [phone]  Physical Therapy Treatment  Patient Details  Name: Adam Yang MRN: [id number] Date of Birth: May 02, 1946 Referring Provider:  Heath Lark, MD  Encounter Date: 03/18/2015      PT End of Session - 03/18/15 1657    Visit Number 7   Number of Visits 9   Date for PT Re-Evaluation 03/28/15   PT Start Time 0998   PT Stop Time 1652   PT Time Calculation (min) 47 min   Activity Tolerance Patient tolerated treatment well   Behavior During Therapy [facility] for tasks assessed/performed      Past Medical History  Diagnosis Date  . Hyperlipidemia   . Hypertension   . Complication of anesthesia   . PONV (postoperative nausea and vomiting)   . Diabetes mellitus without complication     Type II  . Chronic kidney disease     Renal Insuffiency , Creatinine has been in the 2- 3 range, 11/27/14 Creatine 1.4  . Gout   . Arthritis   . Anxiety     Claustrophobia  . Cancer     Past Surgical History  Procedure Laterality Date  . Lymph node biopsy    . Colonoscopy    . Knee surgery Left 1990's    tibia crushed, pinned and bone grafted  . Colonoscopy w/ polypectomy    . Multiple extractions with alveoloplasty N/A 12/06/2014    Procedure: Extraction of tooth #'s 2,4,5,6,7,8,9,10,11,12,13,14,15, 20,22,26,27,28,29 with alveoloplasty.;  Surgeon: Lenn Cal, DDS;  Location: [place];  Service: Oral Surgery;  Laterality: N/A;    There were no vitals filed for this visit.  Visit Diagnosis:  Physical deconditioning  Stiffness of joints, multiple sites      Subjective Assessment - 03/18/15 1606    Subjective Saw the dentist.  He says my mouth is one of the worst cases he's seen and that it'll take a few more months to heal up.   Currently in Pain? Yes   Pain Score 5    Pain Location Mouth   Aggravating Factors  unknown   Pain Relieving  Factors numbing agent            [facility] PT Assessment - 03/18/15 0001    6 minute walk test results    Aerobic Endurance Distance Walked 1100  feet   Endurance additional comments O2 sat 98%; HR 108 bpm                     [facility] Adult PT Treatment/Exercise - 03/18/15 0001    Lumbar Exercises: Aerobic   Stationary Bike NuStep level 4 x 6 mins.   UBE (Upper Arm Bike) level 1, 3 mins. backward and 3 mins. forward   Lumbar Exercises: Standing   Other Standing Lumbar Exercises hold physioball out with arms, partial squat, then straighten knees and arms up overhead while engaging core x 10 (difficult)   Knee/Hip Exercises: Standing   Heel Raises Both;3 sets;10 reps   Lateral Step Up Right;Left;15 reps;Hand Hold: 1;Step Height: 6"  try 8 inch next time   Forward Step Up Right;Left;20 reps;Hand Hold: 1;Step Height: 6"  could do 8 inch next time   Forward Step Up Limitations hand on bike seat back for support   Step Down Right;Left;10 reps;Hand Hold: 1;Step Height: 4"   Shoulder Exercises: Seated   Other Seated Exercises 3 way arm raises, 3 lbs.  x 10 each (fatiguing)   Other Seated Exercises overhead press 3 lbs. x 10 x 2                        Long Term Clinic Goals - 03/11/15 1448    CC Long Term Goal  #1   Title Pt. will be independent in home exercise program for endurance and increased functional strength and knowledgeable about safe self-progression   Status On-going         Head and Neck Clinic Goals - 12/11/14 1658    Patient will be able to verbalize understanding of a home exercise program for cervical range of motion, posture, and walking.    Status Achieved   Patient will be able to verbalize understanding of proper sitting and standing posture.    Status Achieved   Patient will be able to verbalize understanding of lymphedema risk and availability of treatment for this condition.    Status Achieved           Plan  - 03/18/15 1657    Clinical Impression Statement Patient walked 1100 feet on 6 minute walk test, with mild instability felt as he walked, but otherwise tolerated this very well and without significant fatigue.  Continues to increase exercise tolerance.   Pt will benefit from skilled therapeutic intervention in order to improve on the following deficits Decreased activity tolerance;Pain;Decreased range of motion;Impaired perceived functional ability;Postural dysfunction;Decreased endurance   Rehab Potential Excellent   PT Frequency 2x / week   PT Duration 4 weeks   PT Treatment/Interventions Therapeutic exercise   PT Next Visit Plan Assess goals; retest 30 second sit to stand; continue endurance and strengthening; add neck and scapular retraction.   Consulted and Agree with Plan of Care Patient        Problem List Patient Active Problem List   Diagnosis Date Noted  . Hypotension due to drugs 02/27/2015  . Salivary gland infection 02/14/2015  . Constipation due to opioid therapy 02/06/2015  . Rash, skin 02/03/2015  . Leukopenia due to antineoplastic chemotherapy 01/30/2015  . Protein calorie malnutrition 01/24/2015  . Mucositis due to chemotherapy 01/24/2015  . Thrombocytopenia due to drugs 01/17/2015  . Bilateral lower extremity edema 01/17/2015  . Hyperkalemia 01/14/2015  . Anemia due to antineoplastic chemotherapy 01/07/2015  . Hyperglycemia due to type 2 diabetes mellitus 01/07/2015  . Chronic anxiety 12/19/2014  . 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 Type 2 diabetes mellitus, controlled, with renal complications 50/09/7046  . S/P gastrostomy 12/19/2014  . 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

## 2015-03-18 NOTE — Patient Instructions (Addendum)
RECOMMENDATIONS: 1. Brush tongue daily.  2. Use trismus exercises as directed. 3. Use Magic mouthwash as needed and then transition over to salt water or Biotene Rinse or salt water/baking soda rinses. 4. Multiple sips of water as needed. 5. Return to clinic in 5-6 weeks to evaluate healing and discuss referral to Prosthodontist for upper and lower complete dentures with or without implants due to severe atrophy of alveolar ridges.   Call if problems before then.  Lenn Cal, DDS

## 2015-03-18 NOTE — Progress Notes (Signed)
03/18/2015  Patient Name:   Adam Yang Date of Birth:   Mar 11, 1946 Medical Record Number: [id number]  BP 143/70 mmHg  Pulse 101  Temp(Src) 98.1 F (36.7 C) (Oral)  Wt 150 lb (68.04 kg)  Bartley Kestner presents for oral examination after chemoradiation therapy. Patient has completed radiation treatments from 12/23/14 thru 02/10/15. The patient completed 7 weekly chemotherapy treatments.  REVIEW OF CHIEF COMPLAINTS:  DRY MOUTH: Yes HARD TO SWALLOW: Yes  HURT TO SWALLOW: Yes TASTE CHANGES: Taste is returning slowly SORES IN MOUTH: Yes TRISMUS: No problems with trismus exercises. WEIGHT: 150 pounds down from initial 170 pounds  HOME OH REGIMEN:  BRUSHING: Brushes tongue daily. FLOSSING: Not applicable RINSING: Only is able to rinse with the Magic mouthwash due to mucositis. FLUORIDE: Not applicable TRISMUS EXERCISES:  Maximum interincisal opening: 55 mm.   DENTAL EXAM:  Oral Hygiene:(PLAQUE): Edentulous. LOCATION OF MUCOSITIS: Soft palate and back of throat. DESCRIPTION OF SALIVA: Decreased saliva. ANY EXPOSED BONE: None noted OTHER WATCHED AREAS: Previous extraction sites. DX: Xerostomia, Dysgeusia, Dysphagia, Odynophagia, Mucositis and Edentulous, and atrophy of the edentulous alveolar ridges.  RECOMMENDATIONS: 1. Brush tongue daily.  2. Use trismus exercises as directed. 3. Use Magic mouthwash as needed and then transition over to salt water or Biotene Rinse or salt water/baking soda rinses. 4. Multiple sips of water as needed. 5. Return to clinic in 5-6 weeks to evaluate healing and discuss referral to Prosthodontist for upper and lower complete dentures with or without implants due to severe atrophy of alveolar ridges.   Call if problems before then.  Lenn Cal, DDS

## 2015-03-20 ENCOUNTER — Ambulatory Visit (HOSPITAL_BASED_OUTPATIENT_CLINIC_OR_DEPARTMENT_OTHER): Payer: Medicare Other

## 2015-03-20 ENCOUNTER — Telehealth: Payer: Self-pay | Admitting: *Deleted

## 2015-03-20 ENCOUNTER — Other Ambulatory Visit (HOSPITAL_BASED_OUTPATIENT_CLINIC_OR_DEPARTMENT_OTHER): Payer: Medicare Other

## 2015-03-20 ENCOUNTER — Encounter: Payer: Self-pay | Admitting: *Deleted

## 2015-03-20 ENCOUNTER — Encounter: Payer: Self-pay | Admitting: Radiation Oncology

## 2015-03-20 ENCOUNTER — Ambulatory Visit
Admission: RE | Admit: 2015-03-20 | Discharge: 2015-03-20 | Disposition: A | Discharge status: Home / Self Care | Payer: Medicare Other | Source: Ambulatory Visit | Attending: Radiation Oncology | Admitting: Radiation Oncology

## 2015-03-20 VITALS — BP 129/60 | HR 98 | Temp 99.0°F | Resp 12 | Ht 68.0 in | Wt 154.5 lb

## 2015-03-20 DIAGNOSIS — C119 Malignant neoplasm of nasopharynx, unspecified: Secondary | ICD-10-CM

## 2015-03-20 DIAGNOSIS — Z95828 Presence of other vascular implants and grafts: Secondary | ICD-10-CM

## 2015-03-20 LAB — COMPREHENSIVE METABOLIC PANEL (CC13)
ALBUMIN: 3.2 g/dL — AB (ref 3.5–5.0)
ALK PHOS: 58 U/L (ref 40–150)
ALT: 13 U/L (ref 0–55)
AST: 15 U/L (ref 5–34)
Anion Gap: 11 mEq/L (ref 3–11)
BUN: 42.1 mg/dL — AB (ref 7.0–26.0)
CHLORIDE: 100 meq/L (ref 98–109)
CO2: 27 mEq/L (ref 22–29)
Calcium: 9.6 mg/dL (ref 8.4–10.4)
Creatinine: 1.2 mg/dL (ref 0.7–1.3)
EGFR: 61 mL/min/{1.73_m2} — ABNORMAL LOW (ref >90)
GLUCOSE: 148 mg/dL — AB (ref 70–140)
Potassium: 4.5 mEq/L (ref 3.5–5.1)
Sodium: 137 mEq/L (ref 136–145)
Total Bilirubin: 0.44 mg/dL (ref 0.20–1.20)
Total Protein: 6.8 g/dL (ref 6.4–8.3)

## 2015-03-20 LAB — CBC WITH DIFFERENTIAL/PLATELET
BASO%: 1.2 % (ref 0.0–2.0)
BASOS ABS: 0.1 10*3/uL (ref 0.0–0.1)
EOS%: 0.4 % (ref 0.0–7.0)
Eosinophils Absolute: 0 10*3/uL (ref 0.0–0.5)
HCT: 29.4 % — ABNORMAL LOW (ref 38.4–49.9)
HEMOGLOBIN: 9.8 g/dL — AB (ref 13.0–17.1)
LYMPH%: 15.9 % (ref 14.0–49.0)
MCH: 31.8 pg (ref 27.2–33.4)
MCHC: 33.4 g/dL (ref 32.0–36.0)
MCV: 95.2 fL (ref 79.3–98.0)
MONO#: 1 10*3/uL — ABNORMAL HIGH (ref 0.1–0.9)
MONO%: 14 % (ref 0.0–14.0)
NEUT#: 4.9 10*3/uL (ref 1.5–6.5)
NEUT%: 68.5 % (ref 39.0–75.0)
Platelets: 314 10*3/uL (ref 140–400)
RBC: 3.08 10*6/uL — ABNORMAL LOW (ref 4.20–5.82)
RDW: 22.9 % — AB (ref 11.0–14.6)
WBC: 7.2 10*3/uL (ref 4.0–10.3)
lymph#: 1.1 10*3/uL (ref 0.9–3.3)

## 2015-03-20 MED ORDER — SODIUM CHLORIDE 0.9 % IJ SOLN
10.0000 mL | INTRAMUSCULAR | Status: DC | PRN
  Administered 2015-03-20: 10 mL via INTRAVENOUS
  Filled 2015-03-20: qty 10

## 2015-03-20 MED ORDER — HEPARIN SOD (PORK) LOCK FLUSH 100 UNIT/ML IV SOLN
500.0000 [IU] | Freq: Once | INTRAVENOUS | Status: AC
  Administered 2015-03-20: 500 [IU] via INTRAVENOUS
  Filled 2015-03-20: qty 5

## 2015-03-20 NOTE — Patient Instructions (Signed)

## 2015-03-20 NOTE — Progress Notes (Signed)
CC: Dr. Melida Quitter, Dr. 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  He is still recovering from his chemoradiation.  His major complaint is that of mouth discomfort which is 4/10.  He is using magic mouthwash 4 times a day.  He is taking in 6 cans of Glucerna and 3 L of water per day through his PEG tube.  He is able to eat soups, smoothies and pured foods.  His weight is up 8 pounds over the past 3 weeks.  Physical examination: Alert and oriented. Filed Vitals:   03/20/15 1335  BP: 129/60  Pulse: 98  Temp: 99 F (37.2 C)  Resp: 12   Nodes: There is residual adenopathy along the left  IIB region.  There has been significant regression.  This measures approximately 1.5 x 2.5 cm and is ill-defined.  This is where he had necrotic nodes.  There is no other palpable lymphadenopathy in the neck.  Oral cavity and oropharynx remarkable for marked mucositis with partial reepithelialization of his palate and dorsum of the tongue.  Indirect mirror examination otherwise unremarkable.  Impression: Satisfactory progress.  I am pleased with his weight gain.  I assume that Dr. Alvy Bimler will schedule him for a PET scan in approximately 2 months.  Plan: Follow-up visit with me in 2 months.

## 2015-03-20 NOTE — Telephone Encounter (Signed)
  Oncology Nurse Navigator Documentation   Navigator Encounter Type: Telephone (03/20/15 1630)     Per guidance from Dr. Alvy Bimler, called patient, LVM indicating that labs from earlier today reflect stability.  I encouraged him to call with questions.  Gayleen Orem, RN, BSN, [place] at [place] [phone]                     Time Spent with Patient: 15 (03/20/15 1630)

## 2015-03-20 NOTE — Progress Notes (Signed)
  Oncology Nurse Navigator Documentation   Navigator Encounter Type: Clinic/MDC (03/20/15 1320) Patient Visit Type: Radonc (03/20/15 1320)     To provide support and encouragement, care continuity and to assess for needs, met with patient during 1 m f/u appt with Dr. Valere Dross.  He was accompanied by his wife, Adam Yang. 1.  He reported: Upper palate soreness, minimal in morning when wakes up, worsens as day progresses.  Uses MMW throughout the day for relief.  He verbalized understanding of Dr. Charlton Amor explanation of post-tmt RT SE.  Adam Yang indicated it has been incrementally improving since final RT (02/10/15). Able to eat soft foods, soups, smoothies, pureed foods.  Has gained 8 lbs over past weeks. Using PEG for 3-6 cans of Glucerna daily.   I provided them bottle of saline and several applicators for PEG care. Increasing energy, denial of mouth dryness, pain other than roof of mouth. 2.  They indicated they will be attending [facility] series that starts next week. 3.  They understand I can be contacted with needs/concerns.  Adam Orem, RN, BSN, [place] at [place] [phone]                        Time Spent with Patient: 60 (03/20/15 1320)

## 2015-03-20 NOTE — Progress Notes (Addendum)
Adam Yang here for follow up.  He reports pain in his mouth at a 4/10.  He is using magic mouthwash 4 times a day.  He has large ulcers on the roof of his mouth.  He is also rinsing his mouth with baking soda. He reports he is putting about 6 cans of Glucerna and 3000 ml of water per day through his peg tube depending on what he can eat.  He has redness around his peg tube.  He is eating soups, smoothies and pureed foods.  His weight is up 8 lbs from 02/27/15.  He reports having more energy.  He has hyperpigmentation on his neck.  BP 129/60 mmHg  Pulse 98  Temp(Src) 99 F (37.2 C) (Oral)  Resp 12  Ht 5\' 8"  (1.727 m)  Wt 154 lb 8 oz (70.081 kg)  BMI 23.50 kg/m2  SpO2 99%   Wt Readings from Last 3 Encounters:  03/20/15 154 lb 8 oz (70.081 kg)  03/18/15 150 lb (68.04 kg)  02/27/15 146 lb 14.4 oz (66.633 kg)

## 2015-03-21 ENCOUNTER — Encounter: Payer: Self-pay | Admitting: *Deleted

## 2015-03-21 ENCOUNTER — Ambulatory Visit: Payer: Medicare Other | Admitting: Physical Therapy

## 2015-03-21 DIAGNOSIS — R5381 Other malaise: Secondary | ICD-10-CM

## 2015-03-21 DIAGNOSIS — M545 Low back pain, unspecified: Secondary | ICD-10-CM

## 2015-03-21 DIAGNOSIS — M2560 Stiffness of unspecified joint, not elsewhere classified: Secondary | ICD-10-CM

## 2015-03-21 NOTE — Progress Notes (Signed)
Faxed patient's labs to dr Harrington Challenger, patient's PCP

## 2015-03-21 NOTE — Therapy (Addendum)
[place], [place], [address] Phone: [phone]   Fax:  [phone]  Physical Therapy Treatment  Patient Details  Name: Adam Yang MRN: [id number] Date of Birth: 08/25/1945 Referring Provider:  Heath Lark, MD  Encounter Date: 03/21/2015    Past Medical History  Diagnosis Date  . Hyperlipidemia   . Hypertension   . Complication of anesthesia   . PONV (postoperative nausea and vomiting)   . Diabetes mellitus without complication ([place])     Type II  . Chronic kidney disease     Renal Insuffiency , Creatinine has been in the 2- 3 range, 11/27/14 Creatine 1.4  . Gout   . Arthritis   . Anxiety     Claustrophobia  . Cancer [facility])     nasopharynx    Past Surgical History  Procedure Laterality Date  . Lymph node biopsy    . Colonoscopy    . Knee surgery Left 1990's    tibia crushed, pinned and bone grafted  . Colonoscopy w/ polypectomy    . Multiple extractions with alveoloplasty N/A 12/06/2014    Procedure: Extraction of tooth #'s 2,4,5,6,7,8,9,10,11,12,13,14,15, 20,22,26,27,28,29 with alveoloplasty.;  Surgeon: Lenn Cal, DDS;  Location: [place];  Service: Oral Surgery;  Laterality: N/A;    There were no vitals filed for this visit.  Visit Diagnosis:  Physical deconditioning - Plan: PT plan of care cert/re-cert  Stiffness of joints, multiple sites - Plan: PT plan of care cert/re-cert  Midline low back pain without sciatica - Plan: PT plan of care cert/re-cert                                       [facility] Goals - 03/21/15 0944    CC Long Term Goal  #1   Title Pt. will be independent in home exercise program for endurance and increased functional strength and knowledgeable about safe self-progression   Status Achieved   CC Long Term Goal  #2   Title Patient will be able to walk at least 6 minutes without needing to stop due to back pain or  fatigue   Status Achieved   CC Long Term Goal  #3   Title Patient will report back pain improved by at least 50%   Status Achieved   CC Long Term Goal  #4   Title Patient will report at least 25% perceived improvement in function related to decreased fatigue   Status Achieved   CC Long Term Goal  #5   Title Patient will report at least 65% improved function related to decreased fatigue.   Time 4   Period Weeks   Status New   CC Long Term Goal  #6   Title Patient will be able to transition to home or gym workout independently and safely.   Time 4   Period Weeks   Status New   CC Long Term Goal  #7   Title Patient will walk at least 1500 feet in 6 minute walk test.   Time 4   Period Weeks   Status New         Head and Neck Clinic Goals - 12/11/14 1658    Patient will be able to verbalize understanding of a home exercise program for cervical range of motion, posture, and walking.    Status Achieved   Patient will be able to verbalize  understanding of proper sitting and standing posture.    Status Achieved   Patient will be able to verbalize understanding of lymphedema risk and availability of treatment for this condition.    Status Achieved           Problem List Patient Active Problem List   Diagnosis Date Noted  . Cancer of lateral wall of nasopharynx ([place]) 08/22/2015  . Nasopharyngeal cancer ([place])   . Hypotension due to drugs 02/27/2015  . Salivary gland infection 02/14/2015  . Constipation due to opioid therapy 02/06/2015  . Rash, skin 02/03/2015  . Leukopenia due to antineoplastic chemotherapy 01/30/2015  . Protein calorie malnutrition ([place]) 01/24/2015  . Mucositis due to chemotherapy 01/24/2015  . Thrombocytopenia due to drugs 01/17/2015  . Bilateral lower extremity edema 01/17/2015  . Hyperkalemia 01/14/2015  . Anemia due to antineoplastic chemotherapy 01/07/2015  . Hyperglycemia due to type 2 diabetes mellitus ([place]) 01/07/2015  .  Chronic anxiety 12/19/2014  . Stage 2 chronic renal impairment associated with type 2 diabetes mellitus ([place]) 12/19/2014  . Type 2 diabetes mellitus, controlled, with renal complications ([place]) 88/05/314  . S/P gastrostomy ([place]) 12/19/2014  . History of nasopharyngeal cancer 11/26/2014    [name],[name] 09/18/2015, 10:02 PM  [place], [place], [address] Phone: [phone]   Fax:  [place], PT 09/18/2015 10:02 PM  PHYSICAL THERAPY DISCHARGE SUMMARY  Visits from Start of Care: 8  Current functional level related to goals / functional outcomes: Goals that were initially set had been met and more advanced goals written.  However, patient did not schedule the planned additional visits to work toward those goals.   Remaining deficits: Unknown at this time.  Please see note above for status as of the last time patient attended therapy.  He did not return to complete the second half of the planned course.   Education / Equipment: Home exercise program. Plan: Patient agrees to discharge.  Patient goals were met. Patient is being discharged due to not returning since the last visit.  ?????    Serafina Royals, PT 09/18/2015 10:02 PM

## 2015-03-24 ENCOUNTER — Ambulatory Visit: Payer: Self-pay

## 2015-03-31 ENCOUNTER — Ambulatory Visit: Payer: Medicare Other

## 2015-03-31 ENCOUNTER — Telehealth: Payer: Self-pay | Admitting: Hematology and Oncology

## 2015-03-31 ENCOUNTER — Ambulatory Visit (HOSPITAL_BASED_OUTPATIENT_CLINIC_OR_DEPARTMENT_OTHER): Payer: Medicare Other | Admitting: Hematology and Oncology

## 2015-03-31 ENCOUNTER — Encounter: Payer: Self-pay | Admitting: Hematology and Oncology

## 2015-03-31 VITALS — BP 140/70 | HR 90 | Temp 98.2°F | Resp 18 | Ht 68.0 in | Wt 159.5 lb

## 2015-03-31 DIAGNOSIS — K1231 Oral mucositis (ulcerative) due to antineoplastic therapy: Secondary | ICD-10-CM

## 2015-03-31 DIAGNOSIS — C119 Malignant neoplasm of nasopharynx, unspecified: Secondary | ICD-10-CM | POA: Diagnosis not present

## 2015-03-31 DIAGNOSIS — Z931 Gastrostomy status: Secondary | ICD-10-CM

## 2015-03-31 DIAGNOSIS — R5381 Other malaise: Secondary | ICD-10-CM | POA: Diagnosis not present

## 2015-03-31 DIAGNOSIS — R1310 Dysphagia, unspecified: Secondary | ICD-10-CM

## 2015-03-31 MED ORDER — MAGIC MOUTHWASH W/LIDOCAINE: 30.0000 mL | Freq: Four times a day (QID) | ORAL | Status: DC | PRN

## 2015-03-31 NOTE — Assessment & Plan Note (Signed)
He has persistent mucositis but discontinued his pain medicine himself He will continue Magic mouthwash as needed.     

## 2015-03-31 NOTE — Progress Notes (Signed)
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   12/23/2014 - 02/03/2015 Chemotherapy  he received weekly cisplatin   12/23/2014 - 02/10/2015 Radiation Therapy Rec'd helical IMRT tomotherapy:  5950 cGy to high-risk lymph node tumor bed, 5600 cGy to low risk lymph node tumor bed 5600 cGy; all in 35 sessions    02/14/2015 Imaging CT Neck wo Contrast:  Marked involution previously noted LEFT nasopharyngeal mass.  Residual but improved bilateral cervical adenopathy representing metastatic disease.        INTERVAL HISTORY: Please see below for problem oriented charting. He is doing well, eating 100% by mouth and still supplementing with Glucerna He has persistent mucositis but well controlled with MMW  REVIEW OF SYSTEMS:   Constitutional: Denies fevers, chills or abnormal weight loss Eyes: Denies blurriness of vision Respiratory: Denies cough, dyspnea or wheezes Cardiovascular: Denies palpitation, chest discomfort or lower extremity swelling Gastrointestinal:  Denies nausea, heartburn or  change in bowel habits Skin: Denies abnormal skin rashes Lymphatics: Denies new lymphadenopathy or easy bruising Neurological:Denies numbness, tingling or new weaknesses Behavioral/Psych: Mood is stable, no new changes  All other systems were reviewed with the patient and are negative.  I have reviewed the past medical history, past surgical history, social history and family history with the patient and they are unchanged from previous note.  ALLERGIES:  has No Known Allergies.  MEDICATIONS:  Current Outpatient Prescriptions  Medication Sig Dispense Refill  . acetaminophen (TYLENOL) 500 MG tablet Take 500-1,000 mg by mouth every 6 (six) hours as needed for mild pain.    Marland Kitchen glimepiride (AMARYL) 4 MG tablet Take 4 mg by mouth daily with breakfast.    . magic mouthwash w/lidocaine SOLN Take 30 mLs by mouth 4 (four) times daily as needed for mouth pain. 960 mL 1  . Nutritional Supplements (FEEDING SUPPLEMENT, GLUCERNA 1.5 CAL,) LIQD Give 2 cans Glucerna 1.5 once daily via PEG and 1.5 cans TID with 150 cc free water before and after each bolus feeding. Total 6.5 cans daily-100% nutrition needs via PEG. 6.5 Can   . nystatin (MYCOSTATIN) 100000 UNIT/ML suspension SWISH WITH 1 TO 2 TEASPOONSFUL AND SWALLOW 4 TIMES A DAY AS NEEDED FOR MOUTH/THROAT  0  . amLODipine (NORVASC) 5 MG tablet Take 5 mg by mouth 2 (two) times daily.     Marland Kitchen antiseptic oral rinse (BIOTENE) LIQD 15 mLs by Mouth Rinse route as needed for dry mouth.    . diphenhydrAMINE (BENADRYL) 25 MG tablet Take 25 mg by mouth every 6 (six) hours as needed for allergies.     Marland Kitchen emollient (BIAFINE) cream Apply 1 application topically  daily.    Marland Kitchen LORazepam (ATIVAN) 1 MG tablet Take 1 tablet (1 mg total) by mouth 2 (two) times daily as needed for anxiety. (Patient not taking: Reported on 03/20/2015) 40 tablet 0  . Multiple Vitamins-Minerals (CENTRUM SILVER PO) Take by mouth daily.    . Omega-3 Fatty Acids (FISH OIL PO) Take by mouth daily.    .  ondansetron (ZOFRAN) 8 MG tablet Take 1 tablet (8 mg total) by mouth every 8 (eight) hours as needed. (Patient not taking: Reported on 03/20/2015) 60 tablet 1  . oxyCODONE-acetaminophen (PERCOCET) 5-325 MG per tablet Take one or two tablets by mouth every 4-6 hours as needed for pain. (Patient not taking: Reported on 03/20/2015) 50 tablet 0  . pioglitazone (ACTOS) 15 MG tablet Take 15 mg by mouth daily.     No current facility-administered medications for this visit.    PHYSICAL EXAMINATION: ECOG PERFORMANCE STATUS: 1 - Symptomatic but completely ambulatory  Filed Vitals:   03/31/15 1449  BP: 140/70  Pulse: 90  Temp: 98.2 F (36.8 C)  Resp: 18   Filed Weights   03/31/15 1449  Weight: 159 lb 8 oz (72.349 kg)    GENERAL:alert, no distress and comfortable SKIN: skin color, texture, turgor are normal, no rashes or significant lesions EYES: normal, Conjunctiva are pink and non-injected, sclera clear OROPHARYNX:persistent mucositis, no thrush NECK: supple, thyroid normal size, non-tender, without nodularity LYMPH:  no palpable lymphadenopathy in the cervical, axillary or inguinal LUNGS: clear to auscultation and percussion with normal breathing effort HEART: regular rate & rhythm and no murmurs and no lower extremity edema ABDOMEN:abdomen soft, non-tender and normal bowel sounds Musculoskeletal:no cyanosis of digits and no clubbing  NEURO: alert & oriented x 3 with fluent speech, no focal motor/sensory deficits  LABORATORY DATA:  I have reviewed the data as listed    Component Value Date/Time   NA 137 03/20/2015 1418   NA 137 02/16/2015 0400   K 4.5 03/20/2015 1418   K 4.1 02/16/2015 0400   CL 102 02/16/2015 0400   CO2 27 03/20/2015 1418   CO2 28 02/16/2015 0400   GLUCOSE 148* 03/20/2015 1418   GLUCOSE 102* 02/16/2015 0400   BUN 42.1* 03/20/2015 1418   BUN 30* 02/16/2015 0400   CREATININE 1.2 03/20/2015 1418   CREATININE 0.97 02/16/2015 0400   CALCIUM 9.6 03/20/2015 1418    CALCIUM 7.6* 02/16/2015 0400   PROT 6.8 03/20/2015 1418   ALBUMIN 3.2* 03/20/2015 1418   AST 15 03/20/2015 1418   ALT 13 03/20/2015 1418   ALKPHOS 58 03/20/2015 1418   BILITOT 0.44 03/20/2015 1418   GFRNONAA >60 02/16/2015 0400   GFRAA >60 02/16/2015 0400    No results found for: SPEP, UPEP  Lab Results  Component Value Date   WBC 7.2 03/20/2015   NEUTROABS 4.9 03/20/2015   HGB 9.8* 03/20/2015   HCT 29.4* 03/20/2015   MCV 95.2 03/20/2015   PLT 314 03/20/2015      Chemistry      Component Value Date/Time   NA 137 03/20/2015 1418   NA 137 02/16/2015 0400   K 4.5 03/20/2015 1418   K 4.1 02/16/2015 0400   CL 102 02/16/2015 0400   CO2 27 03/20/2015 1418   CO2 28 02/16/2015 0400   BUN 42.1* 03/20/2015 1418   BUN 30* 02/16/2015 0400   CREATININE 1.2 03/20/2015 1418   CREATININE 0.97 02/16/2015 0400      Component Value Date/Time   CALCIUM 9.6 03/20/2015 1418  CALCIUM 7.6* 02/16/2015 0400   ALKPHOS 58 03/20/2015 1418   AST 15 03/20/2015 1418   ALT 13 03/20/2015 1418   BILITOT 0.44 03/20/2015 1418    ASSESSMENT & PLAN:  Nasopharyngeal cancer He is doing well Mucositis is still there but it is improving I will order PET scan to be done before December ENT tumor board Clinically, he has responded well to treatment. 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     S/P gastrostomy He is doing well with oral diet and is gaining weight I recommend consideration for feeding tube removal   Orders Placed This Encounter  Procedures  . NM PET Image Restag (PS) Skull Base To Thigh    Standing Status: Future     Number of Occurrences:      Standing Expiration Date: 05/30/2016    Order Specific Question:  Reason for Exam (SYMPTOM  OR DIAGNOSIS REQUIRED)    Answer:  nasopharyngeal ca,  assess response to Rx    Order Specific Question:  Preferred imaging location?    Answer:  [facility]  . Comprehensive metabolic panel    Standing Status: Future     Number of Occurrences:      Standing Expiration Date: 05/30/2016  . CBC with Differential/Platelet    Standing Status: Future     Number of Occurrences:      Standing Expiration Date: 05/30/2016  . TSH    Standing Status: Future     Number of Occurrences:      Standing Expiration Date: 05/30/2016   All questions were answered. The patient knows to call the clinic with any problems, questions or concerns. No barriers to learning was detected. I spent 15 minutes counseling the patient face to face. The total time spent in the appointment was 20 minutes and more than 50% was on counseling and review of test results     [facility] Edwards, [place], MD 03/31/2015 5:01 PM

## 2015-03-31 NOTE — Assessment & Plan Note (Signed)
He is doing well with oral diet and is gaining weight I recommend consideration for feeding tube removal

## 2015-03-31 NOTE — Assessment & Plan Note (Signed)
He is doing well Mucositis is still there but it is improving I will order PET scan to be done before December ENT tumor board Clinically, he has responded well to treatment. I will schedule port flush when he returns to see radiation oncologist in November

## 2015-03-31 NOTE — Telephone Encounter (Signed)
Pt confirmed labs/ov per 09/19 POF, gave pt AVS and Calendar.Cherylann Banas, gave pt Central Scheduling # for PET scan

## 2015-03-31 NOTE — Therapy (Signed)
[facility] [address] [place], [place], [address] Phone: [phone]   Fax:  [phone]  Speech Language Pathology Treatment  Patient Details  Name: Adam Yang MRN: [id number] Date of Birth: 1946/01/10 Referring Provider:  Lona Kettle, MD  Encounter Date: 03/31/2015      End of Session - 03/31/15 1453    Visit Number 3   Number of Visits 4   SLP Start Time 3810   SLP Stop Time  1751   SLP Time Calculation (min) 31 min   Activity Tolerance Patient limited by pain      Past Medical History  Diagnosis Date  . Hyperlipidemia   . Hypertension   . Complication of anesthesia   . PONV (postoperative nausea and vomiting)   . Diabetes mellitus without complication     Type II  . Chronic kidney disease     Renal Insuffiency , Creatinine has been in the 2- 3 range, 11/27/14 Creatine 1.4  . Gout   . Arthritis   . Anxiety     Claustrophobia  . Cancer     Past Surgical History  Procedure Laterality Date  . Lymph node biopsy    . Colonoscopy    . Knee surgery Left 1990's    tibia crushed, pinned and bone grafted  . Colonoscopy w/ polypectomy    . Multiple extractions with alveoloplasty N/A 12/06/2014    Procedure: Extraction of tooth #'s 2,4,5,6,7,8,9,10,11,12,13,14,15, 20,22,26,27,28,29 with alveoloplasty.;  Surgeon: Lenn Cal, DDS;  Location: [facility];  Service: Oral Surgery;  Laterality: N/A;    There were no vitals filed for this visit.  Visit Diagnosis: Dysphagia      Subjective Assessment - 03/31/15 1415    Subjective Pt reports and SLP noted what appears to be thrush on pt's lingual surface and hard palate. Pt to see Dr. Alvy Bimler today.               ADULT SLP TREATMENT - 03/31/15 1418    General Information   Behavior/Cognition Alert;Cooperative;Pleasant mood   Treatment Provided   Treatment provided Dysphagia   Dysphagia Treatment   Temperature Spikes Noted No   Respiratory  Status Room air   Oral Cavity - Dentition Edentulous   Treatment Methods Skilled observation;Therapeutic exercise   Type of PO's observed Thin liquids   Pharyngeal Phase Signs & Symptoms --  delayed thorat clear x1/11 swallows   Other treatment/comments Pt with 6 cans tube feed/day. Pt with intense odynophagia if without magic mouthwash. SLP encouraged pt to use effortful swallow with all POs, and modified his HEP for limited oral movement inlcuding stress on breath hold, high pitched /i/, holding tongue to back of lips and swallow for [place], as a modification (tongue hold with gums too painful). Pt performing HEP with rare min A.  SLP stressed to pt to incr POs as able, offered encouragement to listen to MDs re: pain med possibilities.   Pain Assessment   Pain Assessment 0-10   Pain Score 4    Pain Location oral cavity   Pain Descriptors / Indicators Constant;Tender   Pain Intervention(s) Premedicated before session;Limited activity within patient's tolerance   Dysphagia Recommendations   Diet recommendations --  as tolerated   Liquids provided via Cup;Straw   Compensations Effortful swallow   Progression Toward Goals   Progression toward goals Progressing toward goals          SLP Education - 03/31/15 1453    Education provided Yes  Education Details modifications for swallowing exercises on HEP (as opposed to non-swallowing), incr POs as tolerated   Person(s) Educated Patient   Methods Explanation;Demonstration   Comprehension Verbalized understanding;Returned demonstration          SLP Short Term Goals - 03/31/15 1456    SLP SHORT TERM GOAL #1   Title pt will complete HEP with rare verbal cues   Baseline renewed 02-24-15   Time 1   Period --  visit   Status Achieved   SLP SHORT TERM GOAL #2   Title pt will tell SLP why he is completing HEP   Baseline renewed 02-24-15   Time 1   Period --  visit   Status On-going          SLP Long Term Goals - 03/31/15 1515     SLP LONG TERM GOAL #1   Title pt will tell SLP signs/symptoms aspiration PNA   Baseline renewed 02-24-15   Time 2   Period --  visits   Status On-going   SLP LONG TERM GOAL #2   Title pt will complete HEP wtih modified independence   Baseline renewed 02-24-15   Time 2   Period --  visits   Status On-going          Plan - 03/31/15 1454    Clinical Impression Statement Pt presents with swallowing with pain at present. Success with HEP looks better than previous session. Pt will need skilled ST to monitor safety with POs during and after rad tx, and assessment of success with HEP procedure.   Speech Therapy Frequency --  approx every 4 weeks   Duration --  for 60 days   Treatment/Interventions Aspiration precaution training;Diet toleration management by SLP;Pharyngeal strengthening exercises;Oral motor exercises;Patient/family education;Compensatory strategies;SLP instruction and feedback   Potential Considerations Pain level   SLP Home Exercise Plan Reviewed today   Consulted and Agree with Plan of Care Patient        Problem List Patient Active Problem List   Diagnosis Date Noted  . Hypotension due to drugs 02/27/2015  . Salivary gland infection 02/14/2015  . Constipation due to opioid therapy 02/06/2015  . Rash, skin 02/03/2015  . Leukopenia due to antineoplastic chemotherapy 01/30/2015  . Protein calorie malnutrition 01/24/2015  . Mucositis due to chemotherapy 01/24/2015  . Thrombocytopenia due to drugs 01/17/2015  . Bilateral lower extremity edema 01/17/2015  . Hyperkalemia 01/14/2015  . Anemia due to antineoplastic chemotherapy 01/07/2015  . Hyperglycemia due to type 2 diabetes mellitus 01/07/2015  . Chronic anxiety 12/19/2014  . 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 Type 2 diabetes mellitus, controlled, with renal complications 78/67/5449  . S/P gastrostomy 12/19/2014  . Nasopharyngeal cancer 11/26/2014     [facility] , MS, [place]  03/31/2015, 3:15 PM  [facility] [address] [place], [place], [address] Phone: [phone]   Fax:  [phone]

## 2015-03-31 NOTE — Patient Instructions (Signed)
Modify your exercises as we discussed today:  Do 10 reps of exercises where you swallow.  Continue doing 20 reps of exercises without a swallow, except the Shaker (3 reps for 45-60 second hold times)  Increase number of reps of exercises with a swallow as your pain subsides  Effortful swallow whenever you eat anything Incr food intake as pain subsides, reduce tube feeds

## 2015-04-03 ENCOUNTER — Ambulatory Visit: Payer: Medicare Other | Admitting: Physical Therapy

## 2015-04-07 ENCOUNTER — Ambulatory Visit: Payer: Self-pay

## 2015-04-07 ENCOUNTER — Ambulatory Visit: Payer: Medicare Other | Admitting: Physical Therapy

## 2015-04-09 ENCOUNTER — Encounter: Payer: Self-pay | Admitting: Physical Therapy

## 2015-04-14 ENCOUNTER — Encounter (HOSPITAL_COMMUNITY): Payer: Self-pay | Admitting: Dentistry

## 2015-04-14 ENCOUNTER — Ambulatory Visit (HOSPITAL_COMMUNITY): Payer: Self-pay | Admitting: Dentistry

## 2015-04-14 ENCOUNTER — Telehealth: Payer: Self-pay | Admitting: *Deleted

## 2015-04-14 ENCOUNTER — Encounter: Payer: Self-pay | Admitting: Physical Therapy

## 2015-04-14 ENCOUNTER — Encounter: Payer: Self-pay | Admitting: Radiation Oncology

## 2015-04-14 VITALS — BP 143/73 | HR 71 | Temp 98.4°F

## 2015-04-14 DIAGNOSIS — K0820 Unspecified atrophy of edentulous alveolar ridge: Secondary | ICD-10-CM

## 2015-04-14 DIAGNOSIS — Z5189 Encounter for other specified aftercare: Secondary | ICD-10-CM | POA: Diagnosis not present

## 2015-04-14 DIAGNOSIS — C119 Malignant neoplasm of nasopharynx, unspecified: Secondary | ICD-10-CM | POA: Diagnosis not present

## 2015-04-14 DIAGNOSIS — K117 Disturbances of salivary secretion: Secondary | ICD-10-CM

## 2015-04-14 DIAGNOSIS — K08109 Complete loss of teeth, unspecified cause, unspecified class: Secondary | ICD-10-CM

## 2015-04-14 DIAGNOSIS — Z0189 Encounter for other specified special examinations: Secondary | ICD-10-CM

## 2015-04-14 DIAGNOSIS — Z9221 Personal history of antineoplastic chemotherapy: Secondary | ICD-10-CM

## 2015-04-14 DIAGNOSIS — Z923 Personal history of irradiation: Secondary | ICD-10-CM

## 2015-04-14 DIAGNOSIS — K1230 Oral mucositis (ulcerative), unspecified: Secondary | ICD-10-CM

## 2015-04-14 DIAGNOSIS — R682 Dry mouth, unspecified: Secondary | ICD-10-CM

## 2015-04-14 NOTE — Patient Instructions (Signed)
RECOMMENDATIONS: 1. Brush the anterior tongue daily.  2. Use trismus exercises as directed. 3. Use salt water/baking soda rinses as needed. 4. Multiple sips of water as needed. 5. Consider follow-up with Dr. Redmond Baseman (ENT) for further workup and biopsy of anterior tongue if patient wants to rule out oral candidiasis involving the anterior tongue and the need for Diflucan therapy.   6. Return to Dental medicine in 5-6 weeks to evaluate patient and discuss referral to Prosthodontist for upper and lower complete dentures with or without implants due to severe atrophy of alveolar ridges.   Call if problems before then.  Lenn Cal, DDS

## 2015-04-14 NOTE — Telephone Encounter (Signed)
Called patient to inform that letter is ready for pick-up, spoke with patient and he is aware of this.

## 2015-04-14 NOTE — Progress Notes (Signed)
04/14/2015  Patient Name:   Adam Yang Date of Birth:   06-05-1946 Medical Record Number: [id number]  BP 143/73 mmHg  Pulse 71  Temp(Src) 98.4 F (36.9 C) (Oral)  Adam Yang presents for oral examination after chemoradiation therapy. Patient completed radiation treatments from 12/23/14 thru 02/10/15. The patient completed 7 weekly chemotherapy treatments. The patient was seen on 03/18/2015 for oral examination after chemoradiation therapy. Patient was found to have significant mucositis involving the soft palate and back of throat. Patient now presents for reevaluation of healing and mucositis.  SUBJECTIVE: Patient is complaining of persistent discomfort involving his mouth. Patient points to the back of his soft palate as the primary area of concern. Patient also points to the central area of the posterior dorsum of the tongue as being an area of discomfort. Patient indicates that he has been using the Magic mouthwash at least 4-6 times a day for his discomfort. Patient is no longer using nystatin rinse previously prescribed. The patient indicates that he is taking 6-7 bottles of Glucerna daily. Patient indicates that he has gained approximately 10 pounds since his last visit.  DENTAL EXAM:  The patient is edentulous.  There is significant atrophy of the edentulous alveolar ridges. The patient has xerostomia. There is persistent mucositis involving the posterior soft palate and back of his throat. The tongue has a 7 mm round area of ulceration within the central portion of the dorsum. This is surrounded by an erythematous portion of the tongue. The anterior portion of the tongue shows evidence of a "white/brown hairy tongue". This is consistent with use of vanilla flavored Glucerna. This does not appear to represent oral candidiasis. The patient has xerostomia.  ASSESSMENT: 1. Persistent mucositis secondary to chemoradiation therapy 2. Xerostomia 3. Odynophagia 4. The patient is  edentulous 5. Atrophy of the edentulous alveolar ridges 6. White/brown hairy tongue  RECOMMENDATIONS: 1. Brush the anterior tongue daily.  2. Use trismus exercises as directed. 3. Use salt water/baking soda rinses as needed. 4. Multiple sips of water as needed. 5. Consider follow-up with Dr. Redmond Baseman (ENT) for further workup and biopsy of anterior tongue if patient wants to rule out oral candidiasis involving the anterior tongue and the need for Diflucan therapy.   6. Return to Dental medicine in 5-6 weeks to evaluate patient and discuss referral to Prosthodontist for upper and lower complete dentures with or without implants due to severe atrophy of alveolar ridges.   Call if problems before then.  Lenn Cal, DDS

## 2015-04-21 ENCOUNTER — Ambulatory Visit: Payer: Self-pay

## 2015-04-21 ENCOUNTER — Ambulatory Visit: Payer: Medicare Other

## 2015-04-22 ENCOUNTER — Other Ambulatory Visit: Payer: Self-pay | Admitting: *Deleted

## 2015-04-22 MED ORDER — MAGIC MOUTHWASH W/LIDOCAINE: 30.0000 mL | Freq: Four times a day (QID) | ORAL | Status: DC | PRN

## 2015-04-22 NOTE — Telephone Encounter (Signed)
Wife left VM requesting refill for pt on his Magic Mouthwash.  Called into [facility] [employer] LVM for wife informing of refill called in to pharmacy.

## 2015-04-23 ENCOUNTER — Encounter: Payer: Self-pay | Admitting: *Deleted

## 2015-04-23 ENCOUNTER — Encounter: Payer: Self-pay | Admitting: Physical Therapy

## 2015-04-23 NOTE — Progress Notes (Signed)
Adam Yang and his wife attended the Fall 2016 5-week [facility] program (Tuesday evening sessions, 6:00-7:15 pm, [facility], beginning 03/25/15).  Gayleen Orem, RN, BSN, [place] at [place] [phone]

## 2015-04-28 ENCOUNTER — Ambulatory Visit: Payer: Self-pay

## 2015-04-28 ENCOUNTER — Ambulatory Visit: Payer: Medicare Other | Attending: Hematology and Oncology

## 2015-04-28 ENCOUNTER — Ambulatory Visit: Payer: Medicare Other

## 2015-04-28 DIAGNOSIS — R1310 Dysphagia, unspecified: Secondary | ICD-10-CM

## 2015-04-28 NOTE — Patient Instructions (Signed)
Make sure to complete the exercises twice a day.

## 2015-04-28 NOTE — Therapy (Signed)
[facility] [address] [place], [place], [address] Phone: [phone]   Fax:  [phone]  Speech Language Pathology Treatment  Patient Details  Name: Westyn Driggers MRN: [id number] Date of Birth: 29-Sep-1945 No Data Recorded  Encounter Date: 04/28/2015      End of Session - 04/28/15 1502    Visit Number 4   Number of Visits 5   Date for SLP Re-Evaluation 06/27/15   SLP Start Time 1400   SLP Stop Time  1439   SLP Time Calculation (min) 39 min   Activity Tolerance Patient tolerated treatment well      Past Medical History  Diagnosis Date  . Hyperlipidemia   . Hypertension   . Complication of anesthesia   . PONV (postoperative nausea and vomiting)   . Diabetes mellitus without complication ([place])     Type II  . Chronic kidney disease     Renal Insuffiency , Creatinine has been in the 2- 3 range, 11/27/14 Creatine 1.4  . Gout   . Arthritis   . Anxiety     Claustrophobia  . Cancer [facility])     Past Surgical History  Procedure Laterality Date  . Lymph node biopsy    . Colonoscopy    . Knee surgery Left 1990's    tibia crushed, pinned and bone grafted  . Colonoscopy w/ polypectomy    . Multiple extractions with alveoloplasty N/A 12/06/2014    Procedure: Extraction of tooth #'s 2,4,5,6,7,8,9,10,11,12,13,14,15, 20,22,26,27,28,29 with alveoloplasty.;  Surgeon: Lenn Cal, DDS;  Location: [facility];  Service: Oral Surgery;  Laterality: N/A;    There were no vitals filed for this visit.  Visit Diagnosis: Dysphagia      Subjective Assessment - 04/28/15 1402    Subjective Pt reports cont sores in mouth, has found that drinks with aloe, and unsweet tea does not burn like every other drink. Pt eats 3 very small meals of 4-8 oz with each item. Varies between 5-6 cans/day of Glucerna. Pt 11 weeks removed from last rad tx, per pt.               ADULT SLP TREATMENT - 04/28/15 1406    General Information    Behavior/Cognition Alert;Cooperative;Pleasant mood   Treatment Provided   Treatment provided Dysphagia   Dysphagia Treatment   Temperature Spikes Noted No   Respiratory Status Room air   Oral Cavity - Dentition Edentulous   Treatment Methods Skilled observation;Therapeutic exercise   Patient observed directly with PO's Yes   Type of PO's observed Thin liquids;Dysphagia 1 (puree)   Feeding Able to feed self   Liquids provided via Cup   Oral Phase Signs & Symptoms --  none   Pharyngeal Phase Signs & Symptoms --  none   Other treatment/comments Pt performed the HEP for dysphagia with excellent success, but states he is not completing x2/day. SLP encouraged x2/day and reminded pt why. Pt requires one more session to verify HEP correct completion. If pt remains without overt s/s aspiration during POs he will be appropriate for d/c.   Pain Assessment   Pain Assessment No/denies pain   Assessment / Recommendations / Plan   Plan --  lkely discharge next visit   Dysphagia Recommendations   Diet recommendations Thin liquid  diet as tolerated   Liquids provided via Cup   Compensations Effortful swallow   Progression Toward Goals   Progression toward goals Progressing toward goals     Pt has met goal  for HEP completion (modified independence) and partially met goal for telling SLP why he is completing therapy. He is appropriate to cont ST for one more session to ensure he consistently completes the HEP successfully and remains safe with POs over time.      SLP Education - 04/28/15 1501    Education provided Yes   Education Details review of why BID HEP necessary   Person(s) Educated Patient   Methods Explanation   Comprehension Verbalized understanding          SLP Short Term Goals - 04/28/15 1505    SLP SHORT TERM GOAL #1   Title pt will complete HEP with rare verbal cues               Status Achieved   SLP SHORT TERM GOAL #2   Title pt will tell SLP why he is completing  HEP   Baseline renewed 02-24-15           Status Partially Met  "So my swallowing gets better."          SLP Long Term Goals - 04/28/15 1506    SLP LONG TERM GOAL #1   Title pt will tell SLP signs/symptoms aspiration PNA   Period --  visits   Status Achieved   SLP LONG TERM GOAL #2   Title pt will complete HEP wtih modified independence over two sessions   Baseline renewed 04-28-15   Time 1   Period --  visits   Status Revised          Plan - 04/28/15 1503    Clinical Impression Statement Pt's odynophagia has lessened since last treatment. Sores in mouth cont. Pt eating approx 8-12 oz food with milk, unsweet tea or aloe infused drink each meal. SLP stressed HEP BID. If pt remains independent with HEP and cont having no overt s/s aspiration during POs, pt likely appropriate for d/c next visit in 8 weeks.   Speech Therapy Frequency --  every other month   Duration --  x1-2 visits   Treatment/Interventions Aspiration precaution training;Diet toleration management by SLP;Pharyngeal strengthening exercises;Oral motor exercises;Patient/family education;Compensatory strategies;SLP instruction and feedback   Potential to Achieve Goals Good        Problem List Patient Active Problem List   Diagnosis Date Noted  . Hypotension due to drugs 02/27/2015  . Salivary gland infection 02/14/2015  . Constipation due to opioid therapy 02/06/2015  . Rash, skin 02/03/2015  . Leukopenia due to antineoplastic chemotherapy 01/30/2015  . Protein calorie malnutrition ([place]) 01/24/2015  . Mucositis due to chemotherapy 01/24/2015  . Thrombocytopenia due to drugs 01/17/2015  . Bilateral lower extremity edema 01/17/2015  . Hyperkalemia 01/14/2015  . Anemia due to antineoplastic chemotherapy 01/07/2015  . Hyperglycemia due to type 2 diabetes mellitus ([place]) 01/07/2015  . Chronic anxiety 12/19/2014  . Stage 2 chronic renal impairment associated with type 2 diabetes mellitus ([place]) 12/19/2014  .  Type 2 diabetes mellitus, controlled, with renal complications ([place]) 35/32/9924  . S/P gastrostomy ([place]) 12/19/2014  . 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 [place], [place], [address] Phone: [phone]   Fax:  [phone]   Name: Shiloh Swopes MRN: [id number] Date of Birth: Jan 02, 1946

## 2015-05-05 ENCOUNTER — Ambulatory Visit: Payer: Self-pay

## 2015-05-13 ENCOUNTER — Other Ambulatory Visit: Payer: Self-pay | Admitting: *Deleted

## 2015-05-13 ENCOUNTER — Other Ambulatory Visit: Payer: Medicare Other

## 2015-05-13 ENCOUNTER — Telehealth: Payer: Self-pay | Admitting: *Deleted

## 2015-05-13 ENCOUNTER — Ambulatory Visit (HOSPITAL_BASED_OUTPATIENT_CLINIC_OR_DEPARTMENT_OTHER): Payer: Medicare Other

## 2015-05-13 ENCOUNTER — Encounter: Payer: Self-pay | Admitting: Radiation Oncology

## 2015-05-13 ENCOUNTER — Ambulatory Visit
Admission: RE | Admit: 2015-05-13 | Discharge: 2015-05-13 | Disposition: A | Discharge status: Home / Self Care | Payer: Medicare Other | Source: Ambulatory Visit | Attending: Radiation Oncology | Admitting: Radiation Oncology

## 2015-05-13 VITALS — BP 135/77 | HR 84 | Temp 98.2°F | Resp 20 | Ht 68.0 in | Wt 162.8 lb

## 2015-05-13 DIAGNOSIS — C119 Malignant neoplasm of nasopharynx, unspecified: Secondary | ICD-10-CM | POA: Diagnosis not present

## 2015-05-13 DIAGNOSIS — Z452 Encounter for adjustment and management of vascular access device: Secondary | ICD-10-CM

## 2015-05-13 DIAGNOSIS — B370 Candidal stomatitis: Secondary | ICD-10-CM

## 2015-05-13 DIAGNOSIS — Z95828 Presence of other vascular implants and grafts: Secondary | ICD-10-CM

## 2015-05-13 MED ORDER — HEPARIN SOD (PORK) LOCK FLUSH 100 UNIT/ML IV SOLN
500.0000 [IU] | Freq: Once | INTRAVENOUS | Status: AC
  Administered 2015-05-13: 500 [IU] via INTRAVENOUS
  Filled 2015-05-13: qty 5

## 2015-05-13 MED ORDER — FLUCONAZOLE 100 MG PO TABS: 100.0000 mg | ORAL_TABLET | Freq: Every day | ORAL | Status: DC

## 2015-05-13 MED ORDER — SODIUM CHLORIDE 0.9 % IJ SOLN
10.0000 mL | INTRAMUSCULAR | Status: DC | PRN
  Administered 2015-05-13: 10 mL via INTRAVENOUS
  Filled 2015-05-13: qty 10

## 2015-05-13 NOTE — Patient Instructions (Signed)

## 2015-05-13 NOTE — Progress Notes (Signed)
CC: Dr. Melida Quitter, Dr. Heath Lark , Dr. 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 His major complaint is that of continued mouth discomfort which remains at 4/10. He continues with magic mouthwash 4 times a day.  His taste is returning.  He is getting more his calories/nutrition by mouth eating soft foods.  He still takes in 4-5 cans of  Glucerna through his PEG tube. His weight is up 3 pounds over the past 2 weeks. Dr. Alvy Bimler has him scheduled for his PET scan on December 6.   Physical examination: Alert and oriented. Filed Vitals:   05/13/15 1116  BP: 135/77  Pulse: 84  Temp: 98.2 F (36.8 C)  Resp: 20    Nodes: There is no palpable lymphadenopathy in the neck. The previously palpable adenopathy along the left neck is no longer appreciated. On inspection oral cavity there is whitish discoloration of the tongue suggestive of candidiasis.  There remains a superficial whitish ulcerated lesion primarily along the left to central hard palate measuring 3-4 cm in greatest diameter. I'm concerned that this may represent candidiasis as well. Indirect mirror examination unremarkable.   Impression: No evidence for persistent disease, biopsy concerned about his palate and tongue findings.  I hope this simply represents candidiasis. He has not tried Diflucan, and  I would like to place him on Diflucan for approximately 2 weeks and then see him back for a follow-up visit.  Is scheduled for a PET scan on December 6.   Plan: As above. Follow-up visit with me in 2 weeks.

## 2015-05-13 NOTE — Progress Notes (Signed)
Adam Yang continues to have mucositis fo the hard palate with whitish covering of his tongue which he states that Dr. Enrique Sack feels it is not thrush.  He has gained weight. BP 135/77 mmHg  Pulse 84  Temp(Src) 98.2 F (36.8 C)  Resp 20  Ht 5\' 8"  (1.727 m)  Wt 162 lb 12.8 oz (73.846 kg)  BMI 24.76 kg/m2   Wt Readings from Last 3 Encounters:  05/13/15 162 lb 12.8 oz (73.846 kg)  03/31/15 159 lb 8 oz (72.349 kg)  03/20/15 154 lb 8 oz (70.081 kg)

## 2015-05-13 NOTE — Telephone Encounter (Signed)
Called in script to [facility] for Fluconazole 100 mg tabs,  15 total, 1 po daily with 1 refill as dictated by Dr. Rexene Edison.

## 2015-05-14 ENCOUNTER — Telehealth: Payer: Self-pay | Admitting: *Deleted

## 2015-05-14 NOTE — Telephone Encounter (Signed)
VM from wife asking about lab results from yesterday?  It appears that these labs were drawn in our lab for another provider.  I called wife back and left VM informing that we will not get results if the labs were ordered by a outside Physician.  They would be sent to outside lab and results sent directly to the physician who ordered the labs.  Instructed wife/pt to call the physician's office for results.

## 2015-05-15 ENCOUNTER — Other Ambulatory Visit: Payer: Self-pay | Admitting: *Deleted

## 2015-05-15 MED ORDER — MAGIC MOUTHWASH W/LIDOCAINE: 30.0000 mL | Freq: Four times a day (QID) | ORAL | Status: DC | PRN

## 2015-05-19 ENCOUNTER — Ambulatory Visit: Payer: Self-pay

## 2015-05-28 ENCOUNTER — Ambulatory Visit
Admission: RE | Admit: 2015-05-28 | Discharge: 2015-05-28 | Disposition: A | Discharge status: Home / Self Care | Payer: Medicare Other | Source: Ambulatory Visit | Attending: Radiation Oncology | Admitting: Radiation Oncology

## 2015-05-28 VITALS — BP 138/82 | HR 101 | Temp 97.7°F | Ht 68.0 in | Wt 161.5 lb

## 2015-05-28 DIAGNOSIS — C119 Malignant neoplasm of nasopharynx, unspecified: Secondary | ICD-10-CM

## 2015-05-28 NOTE — Progress Notes (Signed)
[name] is is today for FU for cancer of nasopharynx.  Nasopharynx has a yellowish patch on the upper area has white patches on his tongue, mouth is dry using magic mouth wash three to four times a day.  Completed Diflucian today.  Energy level is good. BP 138/82 mmHg  Pulse 101  Temp(Src) 97.7 F (36.5 C) (Oral)  Ht 5\' 8"  (1.727 m)  Wt 161 lb 8 oz (73.256 kg)  BMI 24.56 kg/m2  SpO2 98%

## 2015-05-28 NOTE — Progress Notes (Signed)
CC: Dr. Melida Quitter, Dr. York Grice, Dr. 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  He comes back today for a check of his oral cavity.  I felt that he may have had advanced candidiasis with ulceration of his hard palate.  I started him on Diflucan and he is been on this for 2 weeks.  He is having less mouth/port palate discomfort.  Physical examination: Alert and oriented. Filed Vitals:   05/28/15 1146  BP: 138/82  Pulse: 101  Temp: 97.7 F (36.5 C)   On inspection oral cavity the previously noted whitish discoloration/ulceration is improved.  There is raised epithelium peripherally representing granulating tissue.  There is less whitish discoloration along the tongue, but this is still widely present.  Impression: I feel that he had a severe candidiasis infection involving his tongue and hard palate.  He is beginning to improve.  I would like to see him continue with 2 more weeks of Diflucan and then see him for a follow-up visit.  He is scheduled for PET scan on December 6.  Plan: Follow-up visit with me in 2 weeks.  Follow-up with Dr. Alvy Bimler on December 8 after his PET scan on December 7.  He'll also see Dr. Enrique Sack in mid December.

## 2015-05-30 ENCOUNTER — Other Ambulatory Visit: Payer: Self-pay | Admitting: Hematology and Oncology

## 2015-06-02 ENCOUNTER — Telehealth: Payer: Self-pay | Admitting: *Deleted

## 2015-06-02 NOTE — Telephone Encounter (Signed)
A user error has taken place: encounter opened in error, closed for administrative reasons.

## 2015-06-10 ENCOUNTER — Other Ambulatory Visit: Payer: Self-pay | Admitting: *Deleted

## 2015-06-10 MED ORDER — MAGIC MOUTHWASH W/LIDOCAINE: 30.0000 mL | Freq: Four times a day (QID) | ORAL | Status: DC | PRN

## 2015-06-12 ENCOUNTER — Encounter: Payer: Self-pay | Admitting: Radiation Oncology

## 2015-06-12 ENCOUNTER — Ambulatory Visit
Admission: RE | Admit: 2015-06-12 | Discharge: 2015-06-12 | Disposition: A | Discharge status: Home / Self Care | Payer: Medicare Other | Source: Ambulatory Visit | Attending: Radiation Oncology | Admitting: Radiation Oncology

## 2015-06-12 VITALS — BP 159/86 | HR 102 | Temp 98.5°F | Resp 20 | Ht 68.0 in | Wt 162.5 lb

## 2015-06-12 DIAGNOSIS — C119 Malignant neoplasm of nasopharynx, unspecified: Secondary | ICD-10-CM

## 2015-06-12 NOTE — Progress Notes (Signed)
Follow up 2 weeks  From last visit 05/28/15, patient still has thrush on tongue and top of hard palate,  Drinks 2 cans Glucerna in am, eating soft foods lunch and dinner, swallowing okay, some foods burn stated, takes MMW before 2 meals and bedtime,   Has Pet scan scheduled 06/17/15, sees Dr. Alvy Bimler 06/18/15, Speech therapy 06/23/15,  Left nostril pink drainage thick more so past 2 weeks, using saline spray prn,  That has helped the blowing of his nose,  Hasn't seen Dr. Redmond Baseman wants to wait till after Pet scan is done 1:39 PM BP 159/86 mmHg  Pulse 102  Temp(Src) 98.5 F (36.9 C) (Oral)  Resp 20  Ht 5\' 8"  (1.727 m)  Wt 162 lb 8 oz (73.71 kg)  BMI 24.71 kg/m2  SpO2 97%  Wt Readings from Last 3 Encounters:  06/12/15 162 lb 8 oz (73.71 kg)  05/28/15 161 lb 8 oz (73.256 kg)  05/13/15 162 lb 12.8 oz (73.846 kg)

## 2015-06-12 NOTE — Progress Notes (Signed)
CC: Dr. Melida Quitter, Dr. Heath Lark, Dr. 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  He is finishing his one-month course of Diflucan for was believed to be a fungal infection involving his palate and tongue.  His mouth/palate pain is much improved.  He is essentially asymptomatic from an oral cavity standpoint.  He occasionally uses Magic mouthwash.  He is scheduled for his PET scan and a visit with Dr. Alvy Bimler next week.  He is still supplementing his diet with 2 cans of Glucerna a day.  He believes that he can maintain his weight without the use of his feeding tube.  His weight remains stable.  I should mention that he's been having slightly more nasal drainage from his left nostril these past 2 weeks.  He has been using a saline nasal spray.  He plans on seeing Dr. Redmond Baseman after his PET scan.  Physical examination: Alert and oriented.  He looks well. Filed Vitals:   06/12/15 1333  BP: 159/86  Pulse: 102  Temp: 98.5 F (36.9 C)  Resp: 20   Nodes: There is no palpable lymphadenopathy in the neck.  Oral cavity on inspection of the palate there is still granulating tissue with a whitish eschar along the central palate.  There is spotty whitish discoloration along the tongue which does appear to be slightly improved.  Oropharynx is unremarkable to inspection.  Fiberoptic endoscopy or indirect nasopharyngoscopy is not performed today.  Neurologic examination: Grossly nonfocal.  Impression: Suspected fungal infection along his palate is much improved.  There is no clinical evidence for persistent disease although I did not perform nasopharyngoscopy.  He is scheduled for his PET scan next week and then a follow-up visit with Dr. Alvy Bimler.  The plan is to have him see Dr.  Redmond Baseman later December or January for a routine follow-up visit.  With my retirement later this month, I will have him see Dr. Isidore Moos for a follow-up visit in February.  The patient and his wife met Dr. Isidore Moos on a couple of occasions during his treatment and they would like for her to assume his follow-up care along with Dr. Redmond Baseman and Dr. Alvy Bimler.  We discussed removal of his feeding tube, provided that he can maintain his weight for 2 weeks without using his feeding tube.  He may be to get his Port-A-Cath and feeding tube removed at the same time since I believe that both were placed by special procedures/radiology.  Plan: I plan to review his PET scan next week.  Follow-up through Dr. Redmond Baseman and also Dr. Isidore Moos in addition to Dr. Alvy Bimler.

## 2015-06-17 ENCOUNTER — Ambulatory Visit (HOSPITAL_COMMUNITY)
Admission: RE | Admit: 2015-06-17 | Discharge: 2015-06-17 | Disposition: A | Discharge status: Home / Self Care | Payer: Medicare Other | Source: Ambulatory Visit | Attending: Hematology and Oncology | Admitting: Hematology and Oncology

## 2015-06-17 ENCOUNTER — Other Ambulatory Visit (HOSPITAL_BASED_OUTPATIENT_CLINIC_OR_DEPARTMENT_OTHER): Payer: Medicare Other

## 2015-06-17 ENCOUNTER — Ambulatory Visit: Payer: Medicare Other

## 2015-06-17 VITALS — BP 155/90 | HR 97 | Temp 98.2°F | Resp 16

## 2015-06-17 DIAGNOSIS — R918 Other nonspecific abnormal finding of lung field: Secondary | ICD-10-CM | POA: Diagnosis not present

## 2015-06-17 DIAGNOSIS — C119 Malignant neoplasm of nasopharynx, unspecified: Secondary | ICD-10-CM | POA: Diagnosis not present

## 2015-06-17 DIAGNOSIS — Z9221 Personal history of antineoplastic chemotherapy: Secondary | ICD-10-CM | POA: Diagnosis not present

## 2015-06-17 DIAGNOSIS — E46 Unspecified protein-calorie malnutrition: Secondary | ICD-10-CM | POA: Diagnosis not present

## 2015-06-17 DIAGNOSIS — Z95828 Presence of other vascular implants and grafts: Secondary | ICD-10-CM

## 2015-06-17 DIAGNOSIS — Z923 Personal history of irradiation: Secondary | ICD-10-CM | POA: Insufficient documentation

## 2015-06-17 LAB — CBC WITH DIFFERENTIAL/PLATELET
BASO%: 0.7 % (ref 0.0–2.0)
BASOS ABS: 0 10*3/uL (ref 0.0–0.1)
EOS ABS: 0.3 10*3/uL (ref 0.0–0.5)
EOS%: 6.7 % (ref 0.0–7.0)
HEMATOCRIT: 38.7 % (ref 38.4–49.9)
HEMOGLOBIN: 13.1 g/dL (ref 13.0–17.1)
LYMPH#: 0.8 10*3/uL — AB (ref 0.9–3.3)
LYMPH%: 17.8 % (ref 14.0–49.0)
MCH: 29.6 pg (ref 27.2–33.4)
MCHC: 34 g/dL (ref 32.0–36.0)
MCV: 87 fL (ref 79.3–98.0)
MONO#: 0.6 10*3/uL (ref 0.1–0.9)
MONO%: 12.2 % (ref 0.0–14.0)
NEUT#: 3 10*3/uL (ref 1.5–6.5)
NEUT%: 62.6 % (ref 39.0–75.0)
Platelets: 222 10*3/uL (ref 140–400)
RBC: 4.45 10*6/uL (ref 4.20–5.82)
RDW: 14.1 % (ref 11.0–14.6)
WBC: 4.7 10*3/uL (ref 4.0–10.3)

## 2015-06-17 LAB — COMPREHENSIVE METABOLIC PANEL
ALBUMIN: 3.5 g/dL (ref 3.5–5.0)
ALK PHOS: 71 U/L (ref 40–150)
ALT: 21 U/L (ref 0–55)
ANION GAP: 12 meq/L — AB (ref 3–11)
AST: 15 U/L (ref 5–34)
BILIRUBIN TOTAL: 0.31 mg/dL (ref 0.20–1.20)
BUN: 27 mg/dL — ABNORMAL HIGH (ref 7.0–26.0)
CO2: 24 mEq/L (ref 22–29)
Calcium: 10.2 mg/dL (ref 8.4–10.4)
Chloride: 105 mEq/L (ref 98–109)
Creatinine: 1.1 mg/dL (ref 0.7–1.3)
EGFR: 68 mL/min/{1.73_m2} — AB (ref >90)
GLUCOSE: 132 mg/dL (ref 70–140)
Potassium: 4.2 mEq/L (ref 3.5–5.1)
SODIUM: 142 meq/L (ref 136–145)
TOTAL PROTEIN: 7.1 g/dL (ref 6.4–8.3)

## 2015-06-17 LAB — GLUCOSE, CAPILLARY: GLUCOSE-CAPILLARY: 133 mg/dL — AB (ref 65–99)

## 2015-06-17 LAB — TSH: TSH: 3.091 m(IU)/L (ref 0.320–4.118)

## 2015-06-17 MED ORDER — SODIUM CHLORIDE 0.9 % IJ SOLN
10.0000 mL | INTRAMUSCULAR | Status: DC | PRN
  Administered 2015-06-17: 10 mL via INTRAVENOUS
  Filled 2015-06-17: qty 10

## 2015-06-17 MED ORDER — FLUDEOXYGLUCOSE F - 18 (FDG) INJECTION
10.9000 | Freq: Once | INTRAVENOUS | Status: AC | PRN
  Administered 2015-06-17: 10.9 via INTRAVENOUS

## 2015-06-17 NOTE — Patient Instructions (Signed)

## 2015-06-18 ENCOUNTER — Telehealth: Payer: Self-pay | Admitting: Hematology and Oncology

## 2015-06-18 ENCOUNTER — Ambulatory Visit (HOSPITAL_BASED_OUTPATIENT_CLINIC_OR_DEPARTMENT_OTHER): Payer: Medicare Other | Admitting: Hematology and Oncology

## 2015-06-18 ENCOUNTER — Encounter: Payer: Self-pay | Admitting: Hematology and Oncology

## 2015-06-18 VITALS — BP 138/85 | HR 79 | Temp 97.6°F | Resp 18 | Ht 68.0 in | Wt 161.8 lb

## 2015-06-18 DIAGNOSIS — E1122 Type 2 diabetes mellitus with diabetic chronic kidney disease: Secondary | ICD-10-CM | POA: Diagnosis not present

## 2015-06-18 DIAGNOSIS — K1231 Oral mucositis (ulcerative) due to antineoplastic therapy: Secondary | ICD-10-CM

## 2015-06-18 DIAGNOSIS — Z85819 Personal history of malignant neoplasm of unspecified site of lip, oral cavity, and pharynx: Secondary | ICD-10-CM

## 2015-06-18 DIAGNOSIS — C119 Malignant neoplasm of nasopharynx, unspecified: Secondary | ICD-10-CM

## 2015-06-18 DIAGNOSIS — Z85818 Personal history of malignant neoplasm of other sites of lip, oral cavity, and pharynx: Secondary | ICD-10-CM | POA: Diagnosis not present

## 2015-06-18 DIAGNOSIS — Z931 Gastrostomy status: Secondary | ICD-10-CM

## 2015-06-18 DIAGNOSIS — N182 Chronic kidney disease, stage 2 (mild): Secondary | ICD-10-CM

## 2015-06-18 NOTE — Assessment & Plan Note (Signed)
This is slowly improving. I recommend continue conservative management and Magic mouthwash as needed. Clinically, it does not look like he have recurrent yeast infection, rather, related to mucositis from recent treatment.

## 2015-06-18 NOTE — Assessment & Plan Note (Signed)
He is on aggressive medical management with diabetes medicine under the care of his primary doctor. The patient had recurrent hyperkalemia with acute on chronic renal failure, subsequently resolved Continue risk factor management

## 2015-06-18 NOTE — Assessment & Plan Note (Signed)
PET/CT scan showed complete response to treatment. Recommend ENT follow-up for endoscopy evaluation. I recommend removal of port and feeding tube next week. Per recommendation from ENT tumor board today, I will order CT scan with contrast to be done in 4 months for further assessment.

## 2015-06-18 NOTE — Assessment & Plan Note (Signed)
He is still well with oral intake. I will get the feeding tube removed

## 2015-06-18 NOTE — Progress Notes (Signed)
Adam Yang OFFICE PROGRESS NOTE  Patient Care Team: Lona Kettle, MD as PCP - General (Family Medicine) Leota Sauers, RN as Oncology Nurse Navigator Heath Lark, MD as Consulting Physician (Hematology and Oncology) Arloa Koh, MD as Consulting Physician (Radiation Oncology) Karie Mainland, RD as Dietitian (Nutrition)  SUMMARY OF ONCOLOGIC HISTORY: Oncology History   Nasopharyngeal cancer   Staging form: Pharynx - Nasopharynx, AJCC 7th Edition     Clinical stage from 11/26/2014: Stage III (T2, N2, M0) - Signed by Heath Lark, MD on 12/19/2014       History of nasopharyngeal cancer   11/13/2014 Procedure 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   12/23/2014 - 02/03/2015 Chemotherapy  he received weekly cisplatin   12/23/2014 - 02/10/2015 Radiation Therapy Rec'd helical IMRT tomotherapy:  5950 cGy to high-risk lymph node tumor bed, 5600 cGy to low risk lymph node tumor bed 5600 cGy; all in 35 sessions    02/14/2015 Imaging CT Neck wo Contrast:  Marked involution previously noted LEFT nasopharyngeal mass.  Residual but improved bilateral cervical adenopathy representing metastatic disease.       06/17/2015 Imaging PET CT scan showed near complete resolutioon of disease    INTERVAL HISTORY: Please see below for problem oriented charting. He returns for further follow-up. He continues to have significant mucositis and pain at the roof of the mouth. He was recently prescribed fluconazole with minimum improvement. He has retained taste sensation and is able to swallow food for percent by mouth. He is gaining weight He denies new lymphadenopathy. No recent infection.  REVIEW OF  SYSTEMS:   Constitutional: Denies fevers, chills or abnormal weight loss Eyes: Denies blurriness of vision Respiratory: Denies cough, dyspnea or wheezes Cardiovascular: Denies palpitation, chest discomfort or lower extremity swelling Gastrointestinal:  Denies nausea, heartburn or change in bowel habits Skin: Denies abnormal skin rashes Lymphatics: Denies new lymphadenopathy or easy bruising Neurological:Denies numbness, tingling or new weaknesses Behavioral/Psych: Mood is stable, no new changes  All other systems were reviewed with the patient and are negative.  I have reviewed the past medical history, past surgical history, social history and family history with the patient and they are unchanged from previous note.  ALLERGIES:  has No Known Allergies.  MEDICATIONS:  Current Outpatient Prescriptions  Medication Sig Dispense Refill  . acetaminophen (TYLENOL) 500 MG tablet Take 500-1,000 mg by mouth every 6 (six) hours as needed for mild pain.    Marland Kitchen amLODipine (NORVASC) 5 MG tablet Take 5 mg by mouth 2 (two) times daily.     . diphenhydrAMINE (BENADRYL) 25 MG tablet Take 25 mg by mouth every 6 (six) hours as needed for allergies.     . fluconazole (DIFLUCAN) 100 MG tablet Take 1 tablet (100 mg total) by mouth daily. (Patient not taking: Reported on 06/12/2015) 15 tablet 1  . glimepiride (AMARYL) 4 MG tablet Take 4 mg by mouth daily with breakfast.    . LORazepam (ATIVAN) 1 MG tablet Take 1 tablet (1 mg total) by mouth 2 (two) times daily as needed for anxiety. (Patient not taking: Reported on 03/20/2015) 40 tablet 0  . magic mouthwash w/lidocaine SOLN Take 30 mLs by mouth 4 (four) times daily as needed for mouth  pain. 960 mL 1  . Multiple Vitamins-Minerals (CENTRUM SILVER PO) Take by mouth daily.    . naproxen sodium (ANAPROX) 220 MG tablet Take 220 mg by mouth 2 (two) times daily with a meal.    . Nutritional Supplements (FEEDING SUPPLEMENT, GLUCERNA 1.5 CAL,) LIQD Give 2 cans Glucerna 1.5  once daily via PEG and 1.5 cans TID with 150 cc free water before and after each bolus feeding. Total 6.5 cans daily-100% nutrition needs via PEG. 6.5 Can   . Omega-3 Fatty Acids (FISH OIL PO) Take by mouth daily.    Marland Kitchen oxyCODONE-acetaminophen (PERCOCET) 5-325 MG per tablet Take one or two tablets by mouth every 4-6 hours as needed for pain. (Patient not taking: Reported on 03/20/2015) 50 tablet 0  . sodium chloride (OCEAN) 0.65 % SOLN nasal spray Place 1 spray into both nostrils as needed for congestion.     No current facility-administered medications for this visit.    PHYSICAL EXAMINATION: ECOG PERFORMANCE STATUS: 1 - Symptomatic but completely ambulatory  Filed Vitals:   06/18/15 0955  BP: 138/85  Pulse: 79  Temp: 97.6 F (36.4 C)  Resp: 18   Filed Weights   06/18/15 0955  Weight: 161 lb 12.8 oz (73.392 kg)    GENERAL:alert, no distress and comfortable SKIN: skin color, texture, turgor are normal, no rashes or significant lesions EYES: normal, Conjunctiva are pink and non-injected, sclera clear OROPHARYNX:noted mucosal changes at the roof of the mouth. Residual mucositis is noted. This is much improved compared to his examination a month before. No oral thrush is noted. NECK: supple, thyroid normal size, non-tender, without nodularity LYMPH:  no palpable lymphadenopathy in the cervical, axillary or inguinal LUNGS: clear to auscultation and percussion with normal breathing effort HEART: regular rate & rhythm and no murmurs and no lower extremity edema ABDOMEN:abdomen soft, non-tender and normal bowel sounds. Feeding tube site was okay Musculoskeletal:no cyanosis of digits and no clubbing  NEURO: alert & oriented x 3 with fluent speech, no focal motor/sensory deficits  LABORATORY DATA:  I have reviewed the data as listed    Component Value Date/Time   NA 142 06/17/2015 0802   NA 137 02/16/2015 0400   K 4.2 06/17/2015 0802   K 4.1 02/16/2015 0400   CL 102 02/16/2015 0400    CO2 24 06/17/2015 0802   CO2 28 02/16/2015 0400   GLUCOSE 132 06/17/2015 0802   GLUCOSE 102* 02/16/2015 0400   BUN 27.0* 06/17/2015 0802   BUN 30* 02/16/2015 0400   CREATININE 1.1 06/17/2015 0802   CREATININE 0.97 02/16/2015 0400   CALCIUM 10.2 06/17/2015 0802   CALCIUM 7.6* 02/16/2015 0400   PROT 7.1 06/17/2015 0802   ALBUMIN 3.5 06/17/2015 0802   AST 15 06/17/2015 0802   ALT 21 06/17/2015 0802   ALKPHOS 71 06/17/2015 0802   BILITOT 0.31 06/17/2015 0802   GFRNONAA >60 02/16/2015 0400   GFRAA >60 02/16/2015 0400    No results found for: SPEP, UPEP  Lab Results  Component Value Date   WBC 4.7 06/17/2015   NEUTROABS 3.0 06/17/2015   HGB 13.1 06/17/2015   HCT 38.7 06/17/2015   MCV 87.0 06/17/2015   PLT 222 06/17/2015      Chemistry      Component Value Date/Time   NA 142 06/17/2015 0802   NA 137 02/16/2015 0400   K 4.2 06/17/2015 0802   K 4.1 02/16/2015 0400   CL 102 02/16/2015 0400   CO2 24 06/17/2015 0802  CO2 28 02/16/2015 0400   BUN 27.0* 06/17/2015 0802   BUN 30* 02/16/2015 0400   CREATININE 1.1 06/17/2015 0802   CREATININE 0.97 02/16/2015 0400      Component Value Date/Time   CALCIUM 10.2 06/17/2015 0802   CALCIUM 7.6* 02/16/2015 0400   ALKPHOS 71 06/17/2015 0802   AST 15 06/17/2015 0802   ALT 21 06/17/2015 0802   BILITOT 0.31 06/17/2015 0802       RADIOGRAPHIC STUDIES: I have personally reviewed the radiological images as listed and agreed with the findings in the report. 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 Chemo radiation therapy complete. EXAM: NUCLEAR MEDICINE PET SKULL BASE TO THIGH TECHNIQUE: 10.9 mCi F-18 FDG was injected intravenously. Full-ring PET imaging was performed from the skull base to thigh after the radiotracer. CT data was obtained and used for attenuation correction and anatomic localization. FASTING BLOOD GLUCOSE:   Value: 133 mg/dl COMPARISON:  PET scan 12/04/2014 FINDINGS: NECK No significant residual metabolic activity in the posterior LEFT nasopharynx. Reduction in the asymmetric soft tissue thickening in the LEFT posterior nasopharynx Interval near complete resolution of the bilateral hypermetabolic level 2 lymph nodes. Small 5 mm nodule in the LEFT level II station is decreased from 22 mm with SUV max equaled 2.3 decreased from 8.7. RIGHT level 2 lymph node measures 7 mm short axis compared to 19 with no appreciable metabolic activity. No new lymph nodes in the neck. CHEST New nodular thickening at the LEFT lung apex measuring 9 mm (image 48, series 4) with mild metabolic activity (SUV max 2.7. ABDOMEN/PELVIS No abnormal hypermetabolic activity within the liver, pancreas, adrenal glands, or spleen. No hypermetabolic lymph nodes in the abdomen or pelvis. Mild uptake / inflammation associated with the PEG tube tract which is common. SKELETON No focal hypermetabolic activity to suggest skeletal metastasis. IMPRESSION: 1. Resolution of metabolic activity in the posterior LEFT nasopharynx. 2. Marked reduction in size and metabolic activity of bilateral level 2 cervical lymph nodes. Trace activity associated small LEFT level 2 lymph node is likely related to therapy. 3. No evidence of distant metastasis. 4. Nodular thickening at the LEFT lung apex with low metabolic activity is new from prior and favored to represents focus a infection or post therapy inflammation. Recommend attention on follow-up. Electronically Signed   By: Suzy Bouchard M.D.   On: 06/17/2015 10:40     ASSESSMENT & PLAN:  History of nasopharyngeal cancer PET/CT scan showed complete response to treatment. Recommend ENT follow-up for endoscopy evaluation. I recommend removal of port and feeding tube next week. Per recommendation from ENT tumor board today, I will order CT scan with contrast to be done in 4 months for further assessment.  S/P  gastrostomy He is still well with oral intake. I will get the feeding tube removed  Mucositis due to chemotherapy This is slowly improving. I recommend continue conservative management and Magic mouthwash as needed. Clinically, it does not look like he have recurrent yeast infection, rather, related to mucositis from recent treatment.  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 The patient had recurrent hyperkalemia with acute on chronic renal failure, subsequently resolved Continue risk factor management   Orders Placed This Encounter  Procedures  . IR GASTROSTOMY TUBE REMOVAL    Standing Status:  Future     Number of Occurrences:      Standing Expiration Date: 08/18/2016    Order Specific Question:  Reason for Exam (SYMPTOM  OR DIAGNOSIS REQUIRED)    Answer:  no need PEG    Order Specific Question:  Preferred Imaging Location?    Answer:  [facility]    Order Specific Question:  If indicated for the ordered procedure, I authorize the administration of contrast media per Radiology protocol    Answer:  Yes  . IR Removal Tun Access W/ Port W/O FL    Standing Status: Future     Number of Occurrences:      Standing Expiration Date: 08/18/2016    Order Specific Question:  Reason for exam:    Answer:  no need port    Order Specific Question:  Preferred Imaging Location?    Answer:  [facility]  . CT Soft Tissue Neck W Contrast    Standing Status: Future     Number of Occurrences:      Standing Expiration Date: 09/17/2016    Order Specific Question:  Reason for Exam (SYMPTOM  OR DIAGNOSIS REQUIRED)    Answer:  nasopharyngeal ca, assess response to Rx    Order Specific Question:  Preferred imaging location?    Answer:  [facility]  . CBC with Differential/Platelet    Standing Status: Future     Number of Occurrences:       Standing Expiration Date: 07/22/2016   All questions were answered. The patient knows to call the clinic with any problems, questions or concerns. No barriers to learning was detected. I spent 25 minutes counseling the patient face to face. The total time spent in the appointment was 30 minutes and more than 50% was on counseling and review of test results     [facility], [place], MD 06/18/2015 1:30 PM

## 2015-06-18 NOTE — Telephone Encounter (Signed)
Gave and pritned appt sched and avs for pt for April  °

## 2015-06-23 ENCOUNTER — Ambulatory Visit: Payer: Self-pay

## 2015-06-23 ENCOUNTER — Other Ambulatory Visit: Payer: Self-pay | Admitting: Radiology

## 2015-06-23 ENCOUNTER — Telehealth: Payer: Self-pay | Admitting: *Deleted

## 2015-06-23 ENCOUNTER — Encounter (HOSPITAL_COMMUNITY): Payer: Self-pay | Admitting: Dentistry

## 2015-06-23 ENCOUNTER — Ambulatory Visit (HOSPITAL_COMMUNITY): Payer: Self-pay | Admitting: Dentistry

## 2015-06-23 ENCOUNTER — Ambulatory Visit: Payer: Medicare Other

## 2015-06-23 VITALS — BP 180/82 | HR 99 | Temp 97.8°F

## 2015-06-23 DIAGNOSIS — K117 Disturbances of salivary secretion: Secondary | ICD-10-CM | POA: Diagnosis not present

## 2015-06-23 DIAGNOSIS — Z9221 Personal history of antineoplastic chemotherapy: Secondary | ICD-10-CM

## 2015-06-23 DIAGNOSIS — K08109 Complete loss of teeth, unspecified cause, unspecified class: Secondary | ICD-10-CM

## 2015-06-23 DIAGNOSIS — K0820 Unspecified atrophy of edentulous alveolar ridge: Secondary | ICD-10-CM

## 2015-06-23 DIAGNOSIS — K1233 Oral mucositis (ulcerative) due to radiation: Secondary | ICD-10-CM

## 2015-06-23 DIAGNOSIS — K1230 Oral mucositis (ulcerative), unspecified: Secondary | ICD-10-CM | POA: Diagnosis not present

## 2015-06-23 DIAGNOSIS — R682 Dry mouth, unspecified: Secondary | ICD-10-CM

## 2015-06-23 DIAGNOSIS — C119 Malignant neoplasm of nasopharynx, unspecified: Secondary | ICD-10-CM

## 2015-06-23 DIAGNOSIS — Z923 Personal history of irradiation: Secondary | ICD-10-CM

## 2015-06-23 NOTE — Progress Notes (Signed)
06/23/2015  Patient Name:   Adam Yang Date of Birth:   [birth year] Medical Record Number: [id number]  BP 180/82 mmHg  Pulse 99  Temp(Src) 97.8 F (36.6 C) (Oral)  [place] Lowder presents for limited oral examination after chemoradiation therapy. Patient completed radiation treatments from 12/23/14 thru 02/10/15. The patient completed 7 weekly chemotherapy treatments. The patient was seen on 03/18/2015 for oral examination after chemoradiation therapy. Patient was found to have significant mucositis involving the soft palate and back of throat. Patient was again seen on 04/14/2015 and found to have persistent mucositis with questionable oral candidiasis. Dr. Arloa Koh prescribed several courses of Diflucan therapy with some resolution of the oral candidiasis/mucositis by patient report.  The patient now presents for reevaluation of healing and mucositis.  SUBJECTIVE: Patient is complaining of minimal discomfort involving his soft palate and posterior dorsum of tongue. Patient indicates that it is much better than and October 2016. Patient indicates that he has been using the Magic mouthwash as needed for his discomfort. Patient is no longer using the Diflucan therapy. The patient indicates that he is maintaining nutrition well. Indicates that he currently has an A1c of approximately 5.0 by report. Patient indicates that he has gained some weight since last visit.  DENTAL EXAM:  The patient is edentulous.  There is significant atrophy of the edentulous alveolar ridges. The patient has xerostomia. There is persistent mucositis involving the posterior soft palate and back of his throat that is approximately 10 mm past the post Dam area of an upper complete denture. The tongue has a peristent erythematous area involving the posteriro dorsum of the tongue. No ulceration is noted however. The anterior portion of the tongue shows minimal evidence of a "white/brown hairy tongue".    ASSESSMENT: 1. Persistent mucositis secondary to chemoradiation therapy 2. Xerostomia 3. The patient is edentulous 4. Atrophy of the edentulous alveolar ridges  RECOMMENDATIONS: 1. Brush the anterior tongue daily.  2. Use trismus exercises as directed. 3. Use salt water/baking soda rinses as needed. 4. Multiple sips of water as needed. 5. Referral to    6. Return to Prosthodontist for upper and lower complete dentures with or without implants due to severe atrophy of alveolar ridges. To be scheduled for January 2017 as time and space permits in the prosthodontist's schedule.  Call if problems before then.  Lenn Cal, DDS

## 2015-06-23 NOTE — Patient Instructions (Signed)
RECOMMENDATIONS: 1. Brush the anterior tongue daily.  2. Use trismus exercises as directed. 3. Use salt water/baking soda rinses as needed. 4. Multiple sips of water as needed. 5. Referral to Prosthodontist for upper and lower complete dentures with or without implants due to severe atrophy of alveolar ridges. To be scheduled for January 2017 as time and space permits in the prosthodontist's schedule.  Call if problems before then.  Lenn Cal, DDS

## 2015-06-23 NOTE — Telephone Encounter (Signed)
  Oncology Nurse Navigator Documentation   Navigator Encounter Type: Telephone (06/23/15 [id number])         Interventions: Coordination of Care (06/23/15 [id number])      Per Angie's request ([facility]), called patient to inform his appt with Glendell Docker this afternoon has been cancelled, rescheduled for 08/11/15 1:15 at [facility].  LVMM, asked him to call me to confirm message receipt.  Gayleen Orem, RN, BSN, [facility] [place] [phone]         Time Spent with Patient: 15 (06/23/15 0905)

## 2015-06-24 ENCOUNTER — Ambulatory Visit (HOSPITAL_COMMUNITY)
Admission: RE | Admit: 2015-06-24 | Discharge: 2015-06-24 | Disposition: A | Discharge status: Home / Self Care | Payer: Medicare Other | Source: Ambulatory Visit | Attending: Hematology and Oncology | Admitting: Hematology and Oncology

## 2015-06-24 ENCOUNTER — Encounter (HOSPITAL_COMMUNITY): Payer: Self-pay

## 2015-06-24 DIAGNOSIS — Z431 Encounter for attention to gastrostomy: Secondary | ICD-10-CM | POA: Insufficient documentation

## 2015-06-24 DIAGNOSIS — Z79899 Other long term (current) drug therapy: Secondary | ICD-10-CM | POA: Insufficient documentation

## 2015-06-24 DIAGNOSIS — Z85818 Personal history of malignant neoplasm of other sites of lip, oral cavity, and pharynx: Secondary | ICD-10-CM | POA: Insufficient documentation

## 2015-06-24 DIAGNOSIS — Z452 Encounter for adjustment and management of vascular access device: Secondary | ICD-10-CM | POA: Insufficient documentation

## 2015-06-24 DIAGNOSIS — C119 Malignant neoplasm of nasopharynx, unspecified: Secondary | ICD-10-CM | POA: Insufficient documentation

## 2015-06-24 LAB — BASIC METABOLIC PANEL
ANION GAP: 8 (ref 5–15)
BUN: 42 mg/dL — ABNORMAL HIGH (ref 6–20)
CALCIUM: 9.7 mg/dL (ref 8.9–10.3)
CO2: 27 mmol/L (ref 22–32)
CREATININE: 1.13 mg/dL (ref 0.61–1.24)
Chloride: 106 mmol/L (ref 101–111)
Glucose, Bld: 118 mg/dL — ABNORMAL HIGH (ref 65–99)
Potassium: 4.3 mmol/L (ref 3.5–5.1)
SODIUM: 141 mmol/L (ref 135–145)

## 2015-06-24 LAB — PROTIME-INR
INR: 0.98 (ref 0.00–1.49)
PROTHROMBIN TIME: 13.2 s (ref 11.6–15.2)

## 2015-06-24 LAB — CBC
HCT: 36.9 % — ABNORMAL LOW (ref 39.0–52.0)
Hemoglobin: 12.6 g/dL — ABNORMAL LOW (ref 13.0–17.0)
MCH: 30.1 pg (ref 26.0–34.0)
MCHC: 34.1 g/dL (ref 30.0–36.0)
MCV: 88.3 fL (ref 78.0–100.0)
PLATELETS: 195 10*3/uL (ref 150–400)
RBC: 4.18 MIL/uL — AB (ref 4.22–5.81)
RDW: 13.7 % (ref 11.5–15.5)
WBC: 6.2 10*3/uL (ref 4.0–10.5)

## 2015-06-24 LAB — APTT: APTT: 27 s (ref 24–37)

## 2015-06-24 MED ORDER — CEFAZOLIN SODIUM-DEXTROSE 2-3 GM-% IV SOLR
INTRAVENOUS | Status: AC
  Filled 2015-06-24: qty 50

## 2015-06-24 MED ORDER — FENTANYL CITRATE (PF) 100 MCG/2ML IJ SOLN
INTRAMUSCULAR | Status: AC | PRN
  Administered 2015-06-24 (×2): 25 ug via INTRAVENOUS
  Administered 2015-06-24: 50 ug via INTRAVENOUS

## 2015-06-24 MED ORDER — LIDOCAINE HCL 1 % IJ SOLN
INTRAMUSCULAR | Status: AC
  Filled 2015-06-24: qty 20

## 2015-06-24 MED ORDER — FENTANYL CITRATE (PF) 100 MCG/2ML IJ SOLN
INTRAMUSCULAR | Status: AC
  Filled 2015-06-24: qty 2

## 2015-06-24 MED ORDER — SODIUM CHLORIDE 0.9 % IV SOLN
INTRAVENOUS | Status: DC
  Administered 2015-06-24: 13:00:00 via INTRAVENOUS

## 2015-06-24 MED ORDER — MIDAZOLAM HCL 2 MG/2ML IJ SOLN
INTRAMUSCULAR | Status: AC
  Filled 2015-06-24: qty 4

## 2015-06-24 MED ORDER — MIDAZOLAM HCL 2 MG/2ML IJ SOLN
INTRAMUSCULAR | Status: AC | PRN
  Administered 2015-06-24: 0.5 mg via INTRAVENOUS
  Administered 2015-06-24: 1 mg via INTRAVENOUS
  Administered 2015-06-24: 0.5 mg via INTRAVENOUS

## 2015-06-24 MED ORDER — CEFAZOLIN SODIUM-DEXTROSE 2-3 GM-% IV SOLR
2.0000 g | INTRAVENOUS | Status: AC
  Administered 2015-06-24: 2 g via INTRAVENOUS

## 2015-06-24 MED ORDER — HYDROCODONE-ACETAMINOPHEN 5-325 MG PO TABS: 1.0000 | ORAL_TABLET | ORAL | Status: DC | PRN

## 2015-06-24 NOTE — Discharge Instructions (Signed)
Quiet rest remainder of day. No change in medications or diet. Leave dressing on and intact for 24 hours then may remove and bathe as usual. Call for any bleeding.  Tylenol if needed for pain if no relief call [facility]  At [phone].  Incision Care An incision is when a surgeon cuts into your body. After surgery, the incision needs to be cared for properly to prevent infection.  HOW TO CARE FOR YOUR INCISION  Take medicines only as directed by your health care provider.  There are many different ways to close and cover an incision, including stitches, skin glue, and adhesive strips. Follow your health care provider's instructions on:  Incision care.  Bandage (dressing) changes and removal.  Incision closure removal.  Do not take baths, swim, or use a hot tub until your health care provider approves. You may shower as directed by your health care provider.  Resume your normal diet and activities as directed.  Use anti-itch medicine (such as an antihistamine) as directed by your health care provider. The incision may itch while it is healing. Do not pick or scratch at the incision.  Drink enough fluid to keep your urine clear or pale yellow. SEEK MEDICAL CARE IF:   You have drainage, redness, swelling, or pain at your incision site.  You have muscle aches, chills, or a general ill feeling.  You notice a bad smell coming from the incision or dressing.  Your incision edges separate after the sutures, staples, or skin adhesive strips have been removed.  You have persistent nausea or vomiting.  You have a fever.  You are dizzy. SEEK IMMEDIATE MEDICAL CARE IF:   You have a rash.  You faint.  You have difficulty breathing. MAKE SURE YOU:   Understand these instructions.  Will watch your condition.  Will get help right away if you are not doing well or get worse.   This information is not intended to replace advice given to you by your health care  provider. Make sure you discuss any questions you have with your health care provider.   Document Released: 01/15/2005 Document Revised: 07/19/2014 Document Reviewed: 08/22/2013 Elsevier Interactive Patient Education 2016 Elsevier Inc. Moderate Conscious Sedation, Adult Sedation is the use of medicines to promote relaxation and relieve discomfort and anxiety. Moderate conscious sedation is a type of sedation. Under moderate conscious sedation you are less alert than normal but are still able to respond to instructions or stimulation. Moderate conscious sedation is used during short medical and dental procedures. It is milder than deep sedation or general anesthesia and allows you to return to your regular activities sooner. LET [facility] CARE PROVIDER KNOW ABOUT:   Any allergies you have.  All medicines you are taking, including vitamins, herbs, eye drops, creams, and over-the-counter medicines.  Use of steroids (by mouth or creams).  Previous problems you or members of your family have had with the use of anesthetics.  Any blood disorders you have.  Previous surgeries you have had.  Medical conditions you have.  Possibility of pregnancy, if this applies.  Use of cigarettes, alcohol, or illegal drugs. RISKS AND COMPLICATIONS Generally, this is a safe procedure. However, as with any procedure, problems can occur. Possible problems include:  Oversedation.  Trouble breathing on your own. You may need to have a breathing tube until you are awake and breathing on your own.  Allergic reaction to any of the medicines used for the procedure. BEFORE THE PROCEDURE  You may  have blood tests done. These tests can help show how well your kidneys and liver are working. They can also show how well your blood clots.  A physical exam will be done.  Only take medicines as directed by your health care provider. You may need to stop taking medicines (such as blood thinners, aspirin, or  nonsteroidal anti-inflammatory drugs) before the procedure.   Do not eat or drink at least 6 hours before the procedure or as directed by your health care provider.  Arrange for a responsible adult, family member, or friend to take you home after the procedure. He or she should stay with you for at least 24 hours after the procedure, until the medicine has worn off. PROCEDURE   An intravenous (IV) catheter will be inserted into one of your veins. Medicine will be able to flow directly into your body through this catheter. You may be given medicine through this tube to help prevent pain and help you relax.  The medical or dental procedure will be done. AFTER THE PROCEDURE  You will stay in a recovery area until the medicine has worn off. Your blood pressure and pulse will be checked.   Depending on the procedure you had, you may be allowed to go home when you can tolerate liquids and your pain is under control.   This information is not intended to replace advice given to you by your health care provider. Make sure you discuss any questions you have with your health care provider.   Document Released: 03/23/2001 Document Revised: 07/19/2014 Document Reviewed: 03/05/2013 Elsevier Interactive Patient Education [employer].

## 2015-06-24 NOTE — Progress Notes (Signed)
Patient ID: Adam Yang, male   DOB: 05/18/1946, 69 y.o.   MRN: [id number]    Referring Physician(s): Gorsuch,Ni  Chief Complaint:  "I'm here to get my port and stomach tube out"  Subjective: Pt familiar to IR service from prior right chest wall port a cath and gastrostomy tube placements on 12/17/14. He has hx of nasopharyngeal carcinoma and recent PET scan showed complete response to therapy. He is also eating solid food now and presents today for both port a cath/gastrostomy tube removals. He denies recent fever, HA,CP, dyspnea, cough, abd/back pain,N/V or abnormal bleeding. He does have some mouthsorenessfrompriorchemoradiation/mucositis.    Allergies: Review of patient's allergies indicates no known allergies.  Medications: Prior to Admission medications   Medication Sig Start Date End Date Taking? Authorizing Provider  feeding supplement, GLUCERNA SHAKE, (GLUCERNA SHAKE) LIQD Take 237 mLs by mouth 3 (three) times daily between meals.   Yes Historical Provider, MD  LORazepam (ATIVAN) 1 MG tablet Take 1 tablet (1 mg total) by mouth 2 (two) times daily as needed for anxiety. 12/23/14  Yes Eppie Gibson, MD  magic mouthwash w/lidocaine SOLN Take 30 mLs by mouth 4 (four) times daily as needed for mouth pain. 06/10/15  Yes Heath Lark, MD  naproxen sodium (ANAPROX) 220 MG tablet Take 220 mg by mouth 2 (two) times daily with a meal.   Yes Historical Provider, MD  acetaminophen (TYLENOL) 500 MG tablet Take 500-1,000 mg by mouth every 6 (six) hours as needed for mild pain.    Historical Provider, MD  amLODipine (NORVASC) 5 MG tablet Take 5 mg by mouth 2 (two) times daily.     Historical Provider, MD  diphenhydrAMINE (BENADRYL) 25 MG tablet Take 25 mg by mouth every 6 (six) hours as needed for allergies.     Historical Provider, MD  fluconazole (DIFLUCAN) 100 MG tablet Take 1 tablet (100 mg total) by mouth daily. Patient not taking: Reported on 06/12/2015 05/13/15   Arloa Koh, MD  glimepiride  (AMARYL) 4 MG tablet Take 4 mg by mouth daily with breakfast.    Historical Provider, MD  Multiple Vitamins-Minerals (CENTRUM SILVER PO) Take by mouth daily.    Historical Provider, MD  Nutritional Supplements (FEEDING SUPPLEMENT, GLUCERNA 1.5 CAL,) LIQD Give 2 cans Glucerna 1.5 once daily via PEG and 1.5 cans TID with 150 cc free water before and after each bolus feeding. Total 6.5 cans daily-100% nutrition needs via PEG. 02/03/15 05/06/15  Heath Lark, MD  Omega-3 Fatty Acids (FISH OIL PO) Take by mouth daily.    Historical Provider, MD  oxyCODONE-acetaminophen (PERCOCET) 5-325 MG per tablet Take one or two tablets by mouth every 4-6 hours as needed for pain. Patient not taking: Reported on 03/20/2015 12/06/14   Lenn Cal, DDS  sodium chloride (OCEAN) 0.65 % SOLN nasal spray Place 1 spray into both nostrils as needed for congestion.    Historical Provider, MD     Vital Signs: BP 149/76 mmHg  Pulse 87  Temp(Src) 98.8 F (37.1 C) (Oral)  Resp 20  SpO2 97%  Physical Exam awake/alert; chest- CTA bilat; clean, intact rt chest wall PAC; heart- RRR; abd- soft,+BS,NT, intact G tube; ext- no edema  Imaging: No results found.  Labs:  CBC:  Recent Labs  02/15/15 0400 03/20/15 1418 06/17/15 0802 06/24/15 1259  WBC 6.0 7.2 4.7 6.2  HGB 10.2* 9.8* 13.1 12.6*  HCT 30.8* 29.4* 38.7 36.9*  PLT 151 314 222 195    COAGS:  Recent Labs  12/17/14  1000 06/24/15 1259  INR 1.00 0.98  APTT 30 27    BMP:  Recent Labs  02/14/15 1720  02/14/15 2350 02/15/15 0400 02/16/15 0400 03/20/15 1418 06/17/15 0802  NA  --   < > 135 135 137 137 142  K  --   < > 4.0 3.9 4.1 4.5 4.2  CL  --   --  98* 98* 102  --   --   CO2  --   < > 28 27 28 27 24   GLUCOSE  --   < > 212* 146* 102* 148* 132  BUN  --   < > 48* 40* 30* 42.1* 27.0*  CALCIUM  --   < > 7.6* 7.8* 7.6* 9.6 10.2  CREATININE 1.67*  --  1.23 1.14 0.97 1.2 1.1  GFRNONAA 41*  --  59* >60 >60  --   --   GFRAA 47*  --  >60 >60 >60  --    --   < > = values in this interval not displayed.  LIVER FUNCTION TESTS:  Recent Labs  02/10/15 0952 02/14/15 1329 03/20/15 1418 06/17/15 0802  BILITOT 0.30 0.37 0.44 0.31  AST 12 13 15 15   ALT 22 26 13 21   ALKPHOS 80 94 58 71  PROT 6.6 6.5 6.8 7.1  ALBUMIN 2.2* 2.2* 3.2* 3.5    Assessment and Plan: Pt s/p treatment for nasopharyngeal carcinoma with complete response on recent PET. Plan is for port a cath/gastrostomy tube removals today. Details/risks of procedures, including but not limited to , internal bleeding, infection d/w pt/wife with their understanding and consent.    Signed: D. Rowe Robert 06/24/2015, 1:40 PM   I spent a total of 15 minutes at the the patient's bedside AND on the patient's hospital floor or unit, greater than 50% of which was counseling/coordinating care for port a cath/gastrostomy tube removal

## 2015-06-24 NOTE — Procedures (Signed)
Successful removal of the right chest portacath and g-tube without complication.  Minimal blood loss and no immediate complication.

## 2015-07-30 ENCOUNTER — Encounter: Payer: Self-pay | Admitting: Hematology and Oncology

## 2015-07-30 NOTE — Progress Notes (Signed)
Wife said to see if dr. Alvy Bimler will do a letter on letter head to say it is ok for him to go back to work. It needs to list any restrictions. I told her normally form and she took fax# just in case dr. g wants to do form instead of letter.

## 2015-07-31 ENCOUNTER — Encounter: Payer: Self-pay | Admitting: Hematology and Oncology

## 2015-07-31 ENCOUNTER — Encounter: Payer: Self-pay | Admitting: *Deleted

## 2015-07-31 NOTE — Progress Notes (Signed)
Called patient/wife to advise letter is ready for pick up at front with ms. wilma

## 2015-08-11 ENCOUNTER — Ambulatory Visit: Payer: Medicare Other | Attending: Hematology and Oncology

## 2015-08-19 NOTE — Progress Notes (Signed)
Mr. Wege presents for follow up of radiation completed 02/10/2015 to his nasopharynx/neck adenopathy.    Pain issues, if any: His mouth which he rates a 2/10 in his mouth.  Using a feeding tube?: removed December 2016 Weight changes, if any:  Wt Readings from Last 3 Encounters:  08/22/15 158 lb 1.6 oz (71.714 kg)  06/18/15 161 lb 12.8 oz (73.392 kg)  06/12/15 162 lb 8 oz (73.71 kg)   Swallowing issues, if any: He is eating softer foods, and has water with him at all times. He has decreased and thicker saliva. Smoking or chewing tobacco? No Using fluoride trays daily? No, he is being fitted for dentures Last ENT visit was on: He reports he has not seen Dr. Redmond Baseman since diagnosis. Other notable issues, if any: He has had issues with mouth sores, he took diflucan which cleared up the rood of his mouth. He currently has sores over his tongue which are white and pink. The pink areas are over the center of his tongue, and these are the most painful to him. He is taking Naproxen for this pain twice daily  BP 153/81 mmHg  Pulse 105  Temp(Src) 98 F (36.7 C)  Ht 5\' 8"  (1.727 m)  Wt 158 lb 1.6 oz (71.714 kg)  BMI 24.04 kg/m2  SpO2 98%

## 2015-08-20 ENCOUNTER — Other Ambulatory Visit: Payer: Self-pay | Admitting: *Deleted

## 2015-08-20 MED ORDER — MAGIC MOUTHWASH W/LIDOCAINE: 30.0000 mL | Freq: Four times a day (QID) | ORAL | Status: DC | PRN

## 2015-08-20 MED FILL — MAGIC MW,NYS,LIDO,DPH,MAALO: 8 days supply | Qty: 960 | Fill #0

## 2015-08-22 ENCOUNTER — Ambulatory Visit
Admission: RE | Admit: 2015-08-22 | Discharge: 2015-08-22 | Disposition: A | Discharge status: Home / Self Care | Payer: Medicare Other | Source: Ambulatory Visit | Attending: Radiation Oncology | Admitting: Radiation Oncology

## 2015-08-22 ENCOUNTER — Encounter: Payer: Self-pay | Admitting: Radiation Oncology

## 2015-08-22 ENCOUNTER — Encounter: Payer: Self-pay | Admitting: *Deleted

## 2015-08-22 DIAGNOSIS — C112 Malignant neoplasm of lateral wall of nasopharynx: Secondary | ICD-10-CM | POA: Diagnosis not present

## 2015-08-22 MED ORDER — FLUCONAZOLE 100 MG PO TABS: ORAL_TABLET | ORAL | Status: DC

## 2015-08-22 MED FILL — FLUCONAZOLE 100 MG TABLET: 100 | 21 days supply | Qty: 22 | Fill #0

## 2015-08-22 NOTE — Progress Notes (Signed)
Radiation Oncology         [phone] ________________________________  Name: Adam Yang MRN: [id number]  Date: 08/22/2015  DOB: Feb 17, 1946  Follow-Up Visit Note  Outpatient  CC:  Adam Crutch, MD  Adam Quitter, MD  Diagnosis      ICD-9-CM ICD-10-CM   1. 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 He saw Dr. Alvy Yang in December following his restaging PET scan. She felt that it showed a complete response to treatment and ordered a CT scan with contrast to be done in 4 months for further surveillance. She ordered for his feeding tube and port to be removed. He has lost 3 pounds since that appointment and has been taking all food by mouth for the past several weeks.  He is having pain in his mouth today which he rates a 2/10. His feeding tube was removed December 2016. He is eating softer foods and has water with him at all times. He has decreased and thicker saliva. He is not using fluoride trays and is being fitted for dentures. He reports he has not seen Dr. Redmond Yang since his diagnosis. He has had issues with mouth sores. He took Diflucan which cleared up the roof of his mouth. He currently has sores over his tongue which are white and pink. The pink areas are over the center of his tongue and these are the most painful to him. He is taking Naproxen for this pain twice daily.   ALLERGIES:  has No Known Allergies.  Meds: Current Outpatient Prescriptions  Medication Sig Dispense Refill  . feeding supplement, GLUCERNA SHAKE, (GLUCERNA SHAKE) LIQD Take 237 mLs by mouth daily.     . Lactobacillus (ACIDOPHILUS) 100 MG CAPS Take by mouth 3 (three) times daily.    . magic mouthwash w/lidocaine SOLN Take 30 mLs by mouth 4 (four) times daily as needed for mouth pain. 960 mL 1  . Multiple Vitamins-Minerals  (CENTRUM SILVER PO) Take by mouth daily.    . naproxen sodium (ANAPROX) 220 MG tablet Take 220 mg by mouth 2 (two) times daily with a meal.    . Omega-3 Fatty Acids (FISH OIL PO) Take by mouth daily.    Marland Kitchen acetaminophen (TYLENOL) 500 MG tablet Take 500-1,000 mg by mouth every 6 (six) hours as needed for mild pain. Reported on 08/22/2015    . fluconazole (DIFLUCAN) 100 MG tablet Take 2 tablets today, then 1 tablet daily x 20 more days. 22 tablet 0  . sodium chloride (OCEAN) 0.65 % SOLN nasal spray Place 1 spray into both nostrils as needed for congestion. Reported on 08/22/2015     No current facility-administered medications for this encounter.    Physical Findings: The patient is in no acute distress. Patient is alert and oriented.  height is 5\' 8"  (1.727 m) and weight is 158 lb 1.6 oz (71.714 kg). His temperature is 98 F (36.7 C). His blood pressure is 153/81 and his pulse is 105. His oxygen saturation is 98%. .  General: Alert and oriented, in no acute distress HEENT: Head is normocephalic. Extraocular movements are intact. His tongue is notable for a hairy, white surface and centrally there is ulceration on the dorsal portion of the tongue. Ventral tongue without mucositis.  OP clear. Neck: Neck is supple, no palpable cervical or supraclavicular lymphadenopathy. No  lymphedema. Heart: Regular in rate and rhythm with no murmurs, rubs, or gallops. Chest: Clear to auscultation bilaterally, with no rhonchi, wheezes, or rales. Abdomen: Soft, nontender, nondistended, with no rigidity or guarding. Lymphatics: see Neck Exam  Lab Findings: Lab Results  Component Value Date   WBC 6.2 06/24/2015   HGB 12.6* 06/24/2015   HCT 36.9* 06/24/2015   MCV 88.3 06/24/2015   PLT 195 06/24/2015   Lab Results  Component Value Date   TSH 3.091 06/17/2015    Radiographic Findings: No results found.  Impression/Plan:  [name] is recovering from the effects of radiation. No evidence of recurrence. He  does have mucositis with ulceration of his tongue.  I suspect this is related to his treatment, and possibly a fungal infection.  Cannot rule out malignancy, but that would be much more rare.   I prescribed him Diflucan to help with the mucositis. Continue MMW QID.  Adam Orem, RN, our Head and Neck Oncology Navigator will make an appointment with Dr. Redmond Yang by the end of February.  He has an appointment with Dr. Alvy Yang 10/17/2015. I will refer him to Survivorship and he will be scheduled to see Adam Yang June.  I'll see him back for the next appointment to be done within 3 months of his subsequent ENT visit.  _____________________________________   Adam Gibson, MD    This document serves as a record of services personally performed by Adam Gibson, MD. It was created on her behalf by Adam Yang, a trained medical scribe. The creation of this record is based on the scribe's personal observations and the provider's statements to them. This document has been checked and approved by the attending provider.

## 2015-08-25 ENCOUNTER — Telehealth: Payer: Self-pay | Admitting: *Deleted

## 2015-08-25 NOTE — Telephone Encounter (Signed)
  Oncology Nurse Navigator Documentation  Navigator Location: [facility] (08/25/15 0936) Navigator Encounter Type: Telephone (08/25/15 0936) Telephone: Outgoing Call (08/25/15 0936)           Treatment Phase: Post-Tx Follow-up (08/25/15 0936) Barriers/Navigation Needs: Coordination of Care (08/25/15 0936)   Interventions: Coordination of Care (08/25/15 0936)            Acuity: Level 1 (08/25/15 0936) Acuity Level 1: Minimal follow up required (08/25/15 [id number])       Per Dr. Pearlie Oyster guidance, called [facility] to arrange follow-up visit.  Spoke with Janett Billow, requested that patient be contacted and routine follow-up appt arranged to see Dr. Redmond Baseman by the end of February.   She verbalized understanding.  Gayleen Orem, RN, BSN, [place] [facility] [place] [phone]      Time Spent with Patient: 15 (08/25/15 0936)

## 2015-08-25 NOTE — Progress Notes (Signed)
  Oncology Nurse Navigator Documentation  Navigator Location: [facility] (08/22/15 1130) Navigator Encounter Type: 6 month (08/22/15 1130)             Treatment Phase: Post-Tx Follow-up (08/22/15 1130) Barriers/Navigation Needs: No barriers at this time (08/22/15 1130)   Interventions: None required (08/22/15 1130)            Acuity: Level 1 (08/22/15 1130)       Met briefly with Mr. Manville following his f/u appt with Dr. Isidore Moos.  He was accompanied by his wife.  He completed XRT on 02/10/15. He reported:  Back at work PT.  Energy level is returning.  Except for white patchiness and sores on tongue, he has no lingering tmt SEs other than dry mouth. He denied any needs from this navigator at this time, understands I can be contacted.   Gayleen Orem, RN, BSN, [place] [facility] [place] [phone]      Time Spent with Patient: 15 (08/22/15 1130)

## 2015-08-28 ENCOUNTER — Other Ambulatory Visit: Payer: Self-pay | Admitting: Adult Health

## 2015-08-28 ENCOUNTER — Telehealth: Payer: Self-pay | Admitting: Adult Health

## 2015-08-28 DIAGNOSIS — C112 Malignant neoplasm of lateral wall of nasopharynx: Secondary | ICD-10-CM

## 2015-08-28 DIAGNOSIS — R5383 Other fatigue: Secondary | ICD-10-CM

## 2015-08-28 NOTE — Telephone Encounter (Signed)
I called [name] to introduce myself and how I will be involved in his care.  I left a voicemail letting him know that I have scheduled an appointment for him to see me in June, per Dr. Isidore Moos.  I have also mailed a copy of his appt calendar to his home, along with my business card. I encouraged him to call me if he has any questions or concerns. I look forward to participating in his care!  Adam Craze, NP Survivorship Program [facility] [phone]  Appt has been scheduled for Mon, 12/22/15 with lab at 1pm and visit with me at 1:30pm.

## 2015-09-09 DIAGNOSIS — Z85819 Personal history of malignant neoplasm of unspecified site of lip, oral cavity, and pharynx: Secondary | ICD-10-CM | POA: Diagnosis not present

## 2015-09-09 DIAGNOSIS — K148 Other diseases of tongue: Secondary | ICD-10-CM | POA: Diagnosis not present

## 2015-09-09 DIAGNOSIS — Z923 Personal history of irradiation: Secondary | ICD-10-CM | POA: Diagnosis not present

## 2015-09-09 DIAGNOSIS — R682 Dry mouth, unspecified: Secondary | ICD-10-CM | POA: Diagnosis not present

## 2015-09-09 DIAGNOSIS — K140 Glossitis: Secondary | ICD-10-CM | POA: Diagnosis not present

## 2015-09-09 DIAGNOSIS — C112 Malignant neoplasm of lateral wall of nasopharynx: Secondary | ICD-10-CM | POA: Diagnosis not present

## 2015-09-09 MED FILL — MAGIC MW,NYS,LIDO,DPH,MAALO: 8 days supply | Qty: 960 | Fill #0

## 2015-09-23 MED FILL — DOXYCYCLINE HYC 20 MG TAB: 20 | 30 days supply | Qty: 60 | Fill #0

## 2015-10-07 ENCOUNTER — Other Ambulatory Visit: Payer: Self-pay | Admitting: *Deleted

## 2015-10-13 ENCOUNTER — Telehealth: Payer: Self-pay | Admitting: *Deleted

## 2015-10-13 MED ORDER — MAGIC MOUTHWASH W/LIDOCAINE: 30.0000 mL | Freq: Four times a day (QID) | ORAL | Status: DC | PRN

## 2015-10-13 MED FILL — MAGIC MW,NYS,LIDO,DPH,MAALO: 8 days supply | Qty: 960 | Fill #0

## 2015-10-13 NOTE — Telephone Encounter (Signed)
Wife left VM states pt needs refill on MMW.  He continues to have sore tongue.  Pt scheduled for CT and to see Dr. Alvy Bimler later this week.   Refill called into [facility]. Notified pt of refill called in.  He verbalized understanding.

## 2015-10-16 ENCOUNTER — Encounter (HOSPITAL_COMMUNITY): Payer: Self-pay

## 2015-10-16 ENCOUNTER — Ambulatory Visit (HOSPITAL_COMMUNITY)
Admission: RE | Admit: 2015-10-16 | Discharge: 2015-10-16 | Disposition: A | Discharge status: Home / Self Care | Payer: Medicare Other | Source: Ambulatory Visit | Attending: Hematology and Oncology | Admitting: Hematology and Oncology

## 2015-10-16 ENCOUNTER — Other Ambulatory Visit (HOSPITAL_BASED_OUTPATIENT_CLINIC_OR_DEPARTMENT_OTHER): Payer: Medicare Other

## 2015-10-16 DIAGNOSIS — C119 Malignant neoplasm of nasopharynx, unspecified: Secondary | ICD-10-CM

## 2015-10-16 LAB — CBC WITH DIFFERENTIAL/PLATELET
BASO%: 0.6 % (ref 0.0–2.0)
Basophils Absolute: 0 10*3/uL (ref 0.0–0.1)
EOS ABS: 0.1 10*3/uL (ref 0.0–0.5)
EOS%: 2.5 % (ref 0.0–7.0)
HEMATOCRIT: 43.4 % (ref 38.4–49.9)
HGB: 14.4 g/dL (ref 13.0–17.1)
LYMPH#: 0.9 10*3/uL (ref 0.9–3.3)
LYMPH%: 18.6 % (ref 14.0–49.0)
MCH: 29.2 pg (ref 27.2–33.4)
MCHC: 33.2 g/dL (ref 32.0–36.0)
MCV: 87.8 fL (ref 79.3–98.0)
MONO#: 0.5 10*3/uL (ref 0.1–0.9)
MONO%: 9.9 % (ref 0.0–14.0)
NEUT%: 68.4 % (ref 39.0–75.0)
NEUTROS ABS: 3.3 10*3/uL (ref 1.5–6.5)
Platelets: 185 10*3/uL (ref 140–400)
RBC: 4.94 10*6/uL (ref 4.20–5.82)
RDW: 14 % (ref 11.0–14.6)
WBC: 4.9 10*3/uL (ref 4.0–10.3)

## 2015-10-16 LAB — COMPREHENSIVE METABOLIC PANEL
ALT: 13 U/L (ref 0–55)
AST: 18 U/L (ref 5–34)
Albumin: 3.4 g/dL — ABNORMAL LOW (ref 3.5–5.0)
Alkaline Phosphatase: 45 U/L (ref 40–150)
Anion Gap: 10 mEq/L (ref 3–11)
BUN: 33.8 mg/dL — AB (ref 7.0–26.0)
CHLORIDE: 104 meq/L (ref 98–109)
CO2: 28 mEq/L (ref 22–29)
Calcium: 9.8 mg/dL (ref 8.4–10.4)
Creatinine: 1.7 mg/dL — ABNORMAL HIGH (ref 0.7–1.3)
EGFR: 42 mL/min/{1.73_m2} — AB (ref >90)
GLUCOSE: 142 mg/dL — AB (ref 70–140)
POTASSIUM: 4.2 meq/L (ref 3.5–5.1)
SODIUM: 142 meq/L (ref 136–145)
Total Bilirubin: 0.5 mg/dL (ref 0.20–1.20)
Total Protein: 7.1 g/dL (ref 6.4–8.3)

## 2015-10-16 MED ORDER — IOPAMIDOL (ISOVUE-300) INJECTION 61%
75.0000 mL | Freq: Once | INTRAVENOUS | Status: AC | PRN
  Administered 2015-10-16: 60 mL via INTRAVENOUS

## 2015-10-17 ENCOUNTER — Encounter: Payer: Self-pay | Admitting: Hematology and Oncology

## 2015-10-17 ENCOUNTER — Ambulatory Visit (HOSPITAL_BASED_OUTPATIENT_CLINIC_OR_DEPARTMENT_OTHER): Payer: Medicare Other | Admitting: Hematology and Oncology

## 2015-10-17 VITALS — BP 167/82 | HR 60 | Temp 97.6°F | Resp 18 | Wt 155.2 lb

## 2015-10-17 DIAGNOSIS — I10 Essential (primary) hypertension: Secondary | ICD-10-CM

## 2015-10-17 DIAGNOSIS — K1231 Oral mucositis (ulcerative) due to antineoplastic therapy: Secondary | ICD-10-CM | POA: Diagnosis not present

## 2015-10-17 DIAGNOSIS — E1122 Type 2 diabetes mellitus with diabetic chronic kidney disease: Secondary | ICD-10-CM | POA: Diagnosis not present

## 2015-10-17 DIAGNOSIS — Z85819 Personal history of malignant neoplasm of unspecified site of lip, oral cavity, and pharynx: Secondary | ICD-10-CM

## 2015-10-17 DIAGNOSIS — Z85818 Personal history of malignant neoplasm of other sites of lip, oral cavity, and pharynx: Secondary | ICD-10-CM

## 2015-10-17 DIAGNOSIS — N182 Chronic kidney disease, stage 2 (mild): Secondary | ICD-10-CM

## 2015-10-17 NOTE — Assessment & Plan Note (Signed)
He is on aggressive medical management with diabetes medicine under the care of his primary doctor. The patient had recurrent acute on chronic renal failure Continue risk factor management

## 2015-10-17 NOTE — Assessment & Plan Note (Signed)
This is slowly improving. I recommend continue conservative management and Magic mouthwash as needed.

## 2015-10-17 NOTE — Assessment & Plan Note (Signed)
he will continue current medical management. I recommend close follow-up with primary care doctor for medication adjustment.  

## 2015-10-17 NOTE — Assessment & Plan Note (Signed)
CT scan showed no evidence of disease. Examination is satisfactory. He will continue close ENT follow-up. I will transition his care to cancer survivorship clinic.

## 2015-10-17 NOTE — Progress Notes (Signed)
[place] OFFICE PROGRESS NOTE  Patient Care Team: Lona Kettle, MD as PCP - General (Family Medicine) Leota Sauers, RN as Oncology Nurse Navigator Heath Lark, MD as Consulting Physician (Hematology and Oncology) Arloa Koh, MD as Consulting Physician (Radiation Oncology) Karie Mainland, RD as Dietitian (Nutrition)  SUMMARY OF ONCOLOGIC HISTORY: Oncology History   Nasopharyngeal cancer   Staging form: Pharynx - Nasopharynx, AJCC 7th Edition     Clinical stage from 11/26/2014: Stage III (T2, N2, M0) - Signed by Heath Lark, MD on 12/19/2014       History of nasopharyngeal cancer   11/13/2014 Procedure 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   12/23/2014 - 02/03/2015 Chemotherapy  he received weekly cisplatin   12/23/2014 - 02/10/2015 Radiation Therapy Rec'd helical IMRT tomotherapy:  5950 cGy to high-risk lymph node tumor bed, 5600 cGy to low risk lymph node tumor bed 5600 cGy; all in 35 sessions    02/14/2015 Imaging CT Neck wo Contrast:  Marked involution previously noted LEFT nasopharyngeal mass.  Residual but improved bilateral cervical adenopathy representing metastatic disease.       06/17/2015 Imaging PET CT scan showed near complete resolution of disease   06/24/2015 Procedure He had IR removal of port and feeding tube   10/16/2015 Imaging CT scan showed no evidence of cancer    INTERVAL HISTORY: Please see below for problem oriented charting. He returns for further follow-up. He continues to have persistent mucositis pain, relieved by magic mouthwash. Denies swallowing difficulties. No new lymphadenopathy. No new recent infection.  REVIEW OF SYSTEMS:    Constitutional: Denies fevers, chills or abnormal weight loss Eyes: Denies blurriness of vision Respiratory: Denies cough, dyspnea or wheezes Cardiovascular: Denies palpitation, chest discomfort or lower extremity swelling Gastrointestinal:  Denies nausea, heartburn or change in bowel habits Skin: Denies abnormal skin rashes Lymphatics: Denies new lymphadenopathy or easy bruising Neurological:Denies numbness, tingling or new weaknesses Behavioral/Psych: Mood is stable, no new changes  All other systems were reviewed with the patient and are negative.  I have reviewed the past medical history, past surgical history, social history and family history with the patient and they are unchanged from previous note.  ALLERGIES:  has No Known Allergies.  MEDICATIONS:  Current Outpatient Prescriptions  Medication Sig Dispense Refill  . acetaminophen (TYLENOL) 500 MG tablet Take 500-1,000 mg by mouth every 6 (six) hours as needed for mild pain. Reported on 08/22/2015    . feeding supplement, GLUCERNA SHAKE, (GLUCERNA SHAKE) LIQD Take 237 mLs by mouth daily.     . fluconazole (DIFLUCAN) 100 MG tablet Take 2 tablets today, then 1 tablet daily x 20 more days. 22 tablet 0  . Lactobacillus (ACIDOPHILUS) 100 MG CAPS Take by mouth 3 (three) times daily.    . magic mouthwash w/lidocaine SOLN Take 30 mLs by mouth 4 (four) times daily as needed for mouth pain. 960 mL 0  . Multiple Vitamins-Minerals (CENTRUM SILVER PO) Take by mouth daily.    . naproxen sodium (ANAPROX) 220 MG tablet Take 220 mg by mouth 2 (two) times daily with a meal.    . Omega-3 Fatty Acids (FISH OIL PO) Take by mouth daily.    . sodium chloride (OCEAN) 0.65 %  SOLN nasal spray Place 1 spray into both nostrils as needed for congestion. Reported on 08/22/2015     No current facility-administered medications for this visit.    PHYSICAL EXAMINATION: ECOG PERFORMANCE STATUS: 1 - Symptomatic but completely ambulatory  Filed Vitals:    10/17/15 1144  BP: 167/82  Pulse: 60  Temp: 97.6 F (36.4 C)  Resp: 18   Filed Weights   10/17/15 1144  Weight: 155 lb 3.2 oz (70.398 kg)    GENERAL:alert, no distress and comfortable SKIN: skin color, texture, turgor are normal, no rashes or significant lesions EYES: normal, Conjunctiva are pink and non-injected, sclera clear OROPHARYNX:no exudate, no erythema and lips, buccal mucosa, and tongue normal  NECK: supple, thyroid normal size, non-tender, without nodularity LYMPH:  no palpable lymphadenopathy in the cervical, axillary or inguinal LUNGS: clear to auscultation and percussion with normal breathing effort HEART: regular rate & rhythm and no murmurs and no lower extremity edema ABDOMEN:abdomen soft, non-tender and normal bowel sounds Musculoskeletal:no cyanosis of digits and no clubbing  NEURO: alert & oriented x 3 with fluent speech, no focal motor/sensory deficits  LABORATORY DATA:  I have reviewed the data as listed    Component Value Date/Time   NA 142 10/16/2015 1134   NA 141 06/24/2015 1259   K 4.2 10/16/2015 1134   K 4.3 06/24/2015 1259   CL 106 06/24/2015 1259   CO2 28 10/16/2015 1134   CO2 27 06/24/2015 1259   GLUCOSE 142* 10/16/2015 1134   GLUCOSE 118* 06/24/2015 1259   BUN 33.8* 10/16/2015 1134   BUN 42* 06/24/2015 1259   CREATININE 1.7* 10/16/2015 1134   CREATININE 1.13 06/24/2015 1259   CALCIUM 9.8 10/16/2015 1134   CALCIUM 9.7 06/24/2015 1259   PROT 7.1 10/16/2015 1134   ALBUMIN 3.4* 10/16/2015 1134   AST 18 10/16/2015 1134   ALT 13 10/16/2015 1134   ALKPHOS 45 10/16/2015 1134   BILITOT 0.50 10/16/2015 1134   GFRNONAA >60 06/24/2015 1259   GFRAA >60 06/24/2015 1259    No results found for: SPEP, UPEP  Lab Results  Component Value Date   WBC 4.9 10/16/2015   NEUTROABS 3.3 10/16/2015   HGB 14.4 10/16/2015   HCT 43.4 10/16/2015   MCV 87.8 10/16/2015   PLT 185 10/16/2015      Chemistry      Component Value Date/Time   NA 142  10/16/2015 1134   NA 141 06/24/2015 1259   K 4.2 10/16/2015 1134   K 4.3 06/24/2015 1259   CL 106 06/24/2015 1259   CO2 28 10/16/2015 1134   CO2 27 06/24/2015 1259   BUN 33.8* 10/16/2015 1134   BUN 42* 06/24/2015 1259   CREATININE 1.7* 10/16/2015 1134   CREATININE 1.13 06/24/2015 1259      Component Value Date/Time   CALCIUM 9.8 10/16/2015 1134   CALCIUM 9.7 06/24/2015 1259   ALKPHOS 45 10/16/2015 1134   AST 18 10/16/2015 1134   ALT 13 10/16/2015 1134   BILITOT 0.50 10/16/2015 1134       RADIOGRAPHIC STUDIES: I have personally reviewed the radiological images as listed and agreed with the findings in the report. Ct Soft Tissue Neck W Contrast  10/16/2015  CLINICAL DATA:  Follow-up nasopharyngeal cancer. Chemo completed 01/2015. XRT completed 02/2015. EXAM: CT NECK WITH CONTRAST TECHNIQUE: Multidetector CT imaging of the neck was performed using the standard protocol following the bolus administration of intravenous contrast. CONTRAST:  29mL ISOVUE-300 IOPAMIDOL (ISOVUE-300) INJECTION 61% COMPARISON:  CT neck  11/21/2014. CT neck 02/14/2015. PET scan 06/17/2015. FINDINGS: Pharynx and larynx: Mild fullness of the LEFT torus tubarius represents the area of previous tumor. No abnormal mucosal enhancement or significant residual mass. Salivary glands: Involution of previously enlarged RIGHT parotid gland, consistent with resolved RIGHT parotitis. No submandibular gland enlargement. Unremarkable LEFT parotid. Thyroid: Normal. Lymph nodes: Normal.  Resolved pathologic adenopathy. Vascular: Carotid bifurcation atherosclerosis. Limited intracranial: Negative. Visualized orbits: Not visualized. Mastoids and visualized paranasal sinuses: Unremarkable. Skeleton: Spondylosis with significant degenerative disc disease and cervical spine. Upper chest: No pulmonary nodule or visible mediastinal adenopathy. Arch and great vessel atherosclerosis. Port-A-Cath removed. IMPRESSION: No findings concerning for  residual nasopharyngeal carcinoma. Electronically Signed   By: Staci Righter M.D.   On: 10/16/2015 14:03     ASSESSMENT & PLAN:  History of nasopharyngeal cancer CT scan showed no evidence of disease. Examination is satisfactory. He will continue close ENT follow-up. I will transition his care to cancer survivorship clinic.  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 I recommend continue conservative management and Magic mouthwash as needed.  Essential hypertension he will continue current medical management. I recommend close follow-up with primary care doctor for medication adjustment.    No orders of the defined types were placed in this encounter.   All questions were answered. The patient knows to call the clinic with any problems, questions or concerns. No barriers to learning was detected. I spent 15 minutes counseling the patient face to face. The total time spent in the appointment was 20 minutes and more than 50% was on counseling and review of test results     [facility], [place], MD 10/17/2015 12:57 PM

## 2015-11-03 DIAGNOSIS — K148 Other diseases of tongue: Secondary | ICD-10-CM | POA: Diagnosis not present

## 2015-11-03 DIAGNOSIS — R682 Dry mouth, unspecified: Secondary | ICD-10-CM | POA: Diagnosis not present

## 2015-11-03 DIAGNOSIS — K140 Glossitis: Secondary | ICD-10-CM | POA: Diagnosis not present

## 2015-11-03 DIAGNOSIS — Z85818 Personal history of malignant neoplasm of other sites of lip, oral cavity, and pharynx: Secondary | ICD-10-CM | POA: Diagnosis not present

## 2015-11-03 MED FILL — MAGIC MW,NYS,LIDO,DPH,MAALO: 8 days supply | Qty: 960 | Fill #0

## 2015-11-18 ENCOUNTER — Ambulatory Visit (HOSPITAL_COMMUNITY): Payer: Self-pay | Admitting: Dentistry

## 2015-11-25 DIAGNOSIS — I10 Essential (primary) hypertension: Secondary | ICD-10-CM | POA: Diagnosis not present

## 2015-11-25 DIAGNOSIS — E119 Type 2 diabetes mellitus without complications: Secondary | ICD-10-CM | POA: Diagnosis not present

## 2015-12-19 NOTE — Therapy (Signed)
[facility] [address] [place], [place], [address] Phone: [phone]   Fax:  [phone]  Patient Details  Name: Adam Yang MRN: [id number] Date of Birth: 03/16/1946 Referring Provider:  No ref. provider found  Encounter Date: 12/19/2015  SPEECH THERAPY DISCHARGE SUMMARY  Visits from Start of Care: 4  Current functional level related to goals / functional outcomes: Pt's therapy course was not fully completed due to pt not scheduling more sessions. Last goal update below:  SLP Short Term Goals - 04/28/15 1505     SLP SHORT TERM GOAL #1     Title  pt will complete HEP with rare verbal cues                          Status  Achieved     SLP SHORT TERM GOAL #2     Title  pt will tell SLP why he is completing HEP     Baseline  renewed 02-24-15                   Status  Partially Met  "So my swallowing gets better."                SLP Long Term Goals - 04/28/15 1506     SLP LONG TERM GOAL #1     Title  pt will tell SLP signs/symptoms aspiration PNA     Period  --  visits     Status  Achieved     SLP LONG TERM GOAL #2     Title  pt will complete HEP wtih modified independence over two sessions     Baseline  renewed 04-28-15     Time  1     Period  --  visits     Status  Revised                Plan - 04/28/15 1503     Clinical Impression Statement  Pt's odynophagia has lessened since last treatment. Sores in mouth cont. Pt eating approx 8-12 oz food with milk, unsweet tea or aloe infused drink each meal. SLP stressed HEP BID. If pt remains independent with HEP and cont having no overt s/s aspiration during POs, pt likely appropriate for d/c next visit in 8 weeks.      Remaining deficits: Unknown as pt has not returned to ST since October 2016.   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  Patient goals were partially met.  Patient is being discharged due to not returning since the last visit.  ?????       [place] ,MS, CCC-SLP  12/19/2015, 2:38 PM  [facility] [address] [place], [place], [address] Phone: [phone]   Fax:  [phone]

## 2015-12-22 ENCOUNTER — Ambulatory Visit (HOSPITAL_BASED_OUTPATIENT_CLINIC_OR_DEPARTMENT_OTHER): Payer: Medicare Other | Admitting: Adult Health

## 2015-12-22 ENCOUNTER — Encounter: Payer: Self-pay | Admitting: Adult Health

## 2015-12-22 ENCOUNTER — Other Ambulatory Visit (HOSPITAL_BASED_OUTPATIENT_CLINIC_OR_DEPARTMENT_OTHER): Payer: Medicare Other

## 2015-12-22 VITALS — BP 149/93 | HR 83 | Temp 98.4°F | Resp 14 | Wt 154.4 lb

## 2015-12-22 DIAGNOSIS — Z85818 Personal history of malignant neoplasm of other sites of lip, oral cavity, and pharynx: Secondary | ICD-10-CM | POA: Diagnosis not present

## 2015-12-22 DIAGNOSIS — Z79899 Other long term (current) drug therapy: Secondary | ICD-10-CM

## 2015-12-22 DIAGNOSIS — K1231 Oral mucositis (ulcerative) due to antineoplastic therapy: Secondary | ICD-10-CM

## 2015-12-22 DIAGNOSIS — C112 Malignant neoplasm of lateral wall of nasopharynx: Secondary | ICD-10-CM

## 2015-12-22 DIAGNOSIS — C119 Malignant neoplasm of nasopharynx, unspecified: Secondary | ICD-10-CM

## 2015-12-22 DIAGNOSIS — R5383 Other fatigue: Secondary | ICD-10-CM

## 2015-12-22 LAB — TSH: TSH: 2.103 m(IU)/L (ref 0.320–4.118)

## 2015-12-22 NOTE — Progress Notes (Signed)
Survivorship Care Plan  Provided by Mike Craze, NP on 12/22/2015     General Information  Patient name Adam Yang  Patient ID [id number]  Date of birth July 17, 1945    Your Care Team  Patient Care Team: Lona Kettle, MD - Family Medicine (PCP) Melida Quitter, MD - Otolaryngology (ENT) Heath Lark, MD - Medical Oncologist Arloa Koh, MD - Radiation Oncologist Eppie Gibson, MD - Radiation Oncologist Teena Dunk, DDS - Dentist Gayleen Orem, RN - Head & Neck Oncology Nurse Navigator Garald Balding, SLP - Speech Therapist Serafina Royals, PT - Physical Therapist Polo Riley, LCSW - Social Worker Dory Peru, [place] - Dietitian Mike Craze, NP - Survivorship Nurse Practitioner  *To reach your [facility] providers, call [phone].   Cancer Diagnosis Information  Diagnosis Nasopharyngeal cancer  Diagnosis Date 11/13/14  Staging Nasopharyngeal cancer   Staging form: Pharynx - Nasopharynx, AJCC 7th Edition     Clinical stage from 11/26/2014: Stage III (T2, N2, M0)     Background Information  Family history Family History  Problem Relation Age of Onset  . Throat cancer Mother   . Lung cancer Father   . Throat cancer Brother   . 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    12/23/2014 - 02/03/2015 Chemotherapy Concurrent chemoradiation. Weekly Cisplatin 30 mg/m2 x 7 cycles Alvy Bimler)   12/23/2014 - 02/10/2015 Radiation Therapy IMRT tomotherapy Valere Dross). Total dose: Nasopharynx/neck adenopathy 70 Gy.  Total dose to high-risk lymph nodes: 59.5 Gy. Total dose to low-risk lymph nodes: 56 Gy.  Completed therapy in 35 fractions.    02/14/2015 - 02/16/2015 Hospital Admission Admission for hyperkalemia, hyperglycemia, and acute salivary gland infection    02/14/2015 Imaging CT Neck wo Contrast:  Marked involution previously noted LEFT nasopharyngeal mass.  Residual but improved bilateral cervical adenopathy representing metastatic disease.       06/17/2015 Imaging PET CT scan showed near complete resolution of disease   06/24/2015 Procedure He had IR removal of port and feeding tube   10/16/2015 Imaging CT scan showed no evidence of cancer            Treatment goal Curative  RADIATION THERAPY:  Location of Radiation Nasopharynx & both [place] of neck  Radiation Period  Start Date: 12/23/14                 End Date: 02/10/15  Number of Radiation Treatments 35 treatments  Total Radiation Dose Nasopharynx: 70 Gy Neck lymph nodes (high risk): 59.5 Gy Neck lymph nodes (low risk): 56 Gy   Potential Late & Long-Term Side Effects of Head & Neck Radiation Therapy   Fatigue  Dry mouth  Altered taste or loss of taste  Trouble swallowing  Jaw muscle spasms/pain  Skin changes in the area treated with radiation  Neck muscle spasms/pain  Lymphedema (swelling in the face and/or neck)  Hypothyroidism (sluggish thyroid gland)  *Side effects from radiation therapy are dependent on the areas of the body in the treatment field.  These are the most common late/long-term effects for patients treated for head & neck cancer.     CHEMOTHERAPY: Chemotherapy Given With Radiation Therapy Yes   Drugs Received Date Started Date Completed # cycles (number of times drug was received) Comments    [x]   Cisplatin  12/23/14  02/03/15 7    Potential Late & Long-Term Side Effects of Chemotherapy   Fatigue  Neuropathy (numbness/tingling in hands or feet)  Hearing loss (Cisplatin only)  Infertility  Kidney damage/kidney failure  Liver problems  Rare secondary cancers       NUTRITIONAL SUPPORT: Placement of Feeding Tube Yes  Date of Placement 12/17/14  Date of Removal 06/24/15  Placement of Port-a-Cath Yes  Date of Placement 12/17/14  Date of Removal 06/24/15                                              Survivorship Care & Follow-up Schedule  *Becoming a cancer survivor is a time of great celebration, but also a time of many questions and concerns.  The following information and resources are for you, your caregivers, and your primary care doctor to better understand the needs of a cancer survivor.     How often will I see my cancer providers: Survivor Years 0-2 (Survivor Years defined by length of time since cancer diagnosis)  When?  Responsible Provider  Otolaryngologist (ENT) Every 3-6 months Dr. Redmond Baseman (alternating with Dr. Isidore Moos)  Radiation Oncology visits Every 3-6 months Dr. Isidore Moos (alternating with Dr. Redmond Baseman)  Medical Oncology visits Closely for several months, then discharged for continued follow-ups with ENT, Radiation Oncology, & Survivorship   Dr. Alvy Bimler  Dentist As directed Dr. Enrique Sack  Lab tests [facility]) Every 6-12 months Dr. Isidore Moos  Scans/Imaging exams No routine scans will be ordered. Imaging will only be done at the discretion of medical team or for new symptoms.  Dr. Harlow Ohms. Redmond Baseman  Nurse Navigator As needed Gayleen Orem, RN  Dietitian As needed Dory Peru, RD  Speech Therapist As directed Garald Balding, SLP  Social Worker As needed Polo Riley, LCSW  Survivorship Nurse Practitioner (NP) After completion of treatment, then as needed  Mike Craze, NP                  How often will I see my cancer  providers: Survivor Years 2-5 (Survivor Years defined by length of time since cancer diagnosis)  When?  Responsible Provider  ENT visits Annually Dr. Redmond Baseman (alternating with Dr. Isidore Moos)  Radiation Oncology visits Annually Dr. Isidore Moos (alternating with Dr. Redmond Baseman)  Survivorship Nurse Practitioner (NP) When you are about 2 years out from completing your treatment, Dr. Isidore Moos may refer you back to survivorship as a long-term survivor! Mike Craze, NP   Annually  Mike Craze, NP (alternating with Dr. Redmond Baseman)   5 Years: GRADUATION!  Mike Craze, NP  Lab tests [facility]) Every 6-12 months  Dr. Ivonne Andrew, NP  Dentist 3-4 times per year or as directed  Your Primary Care Dentist     Upcoming Appointments:   Appointment Date & Time Who am I seeing?   ENT Sometime in 03/2016 Dr. Redmond Baseman  Radiation Oncology  Sometime  in 06/2016 Dr. Isidore Moos  Survivorship Nurse Practitioner (NP) As needed, until 2 years out from treatment. Mike Craze, NP  Dietitian As needed Dory Peru, RD  Speech Therapy As needed Garald Balding, SLP  Social Work As needed Polo Riley, LCSW  Physical Therapy As needed Serafina Royals, PT                 Common Complaints/Concerns of Cancer Survivors  *Patients often worry if what they are experiencing is normal, or to be expected, given their cancer history and treatment.  Below are common complaints & concerns reported by cancer survivors. If you are concerned about symptoms you are experiencing, please report all symptoms to your cancer care team!    Common Complaints/Concerns for Head & Neck Cancer Survivors   Fatigue  Memory problems and/or confusion  Depression  Anxiety  Dry mouth  Difficulty swallowing  Changes in your eating habits, your taste, or your smell  Weight changes  Hypothyroidism ("sluggish" thyroid)  Insomnia or trouble sleeping  Lymphedema (swelling in your neck or face)  Decreased range of motion (difficulty  moving neck)  Intimacy and sexuality  Marital/partner/family relationships   Employment, health insurance concerns, and/or finances  Concerns regarding spirituality  Exercise after cancer treatments                            Symptoms to Watch For and Report to Your Provider  *Often cancer survivors are fearful about their cancer coming back or being diagnosed with a new cancer.  Below are symptoms that you and your loved ones should watch for and report to your provider.   General Symptoms to Watch For and Report to Your Provider .  Return of the cancer symptoms you had before- such as a lump or new growth where your cancer first started . New or unusual pain that seems unrelated to an injury and does not go away, including back pain or bone pain . Weight loss without trying/intending . Unexplained bleeding . A rash or allergic reaction, such as swelling, severe itching, or wheezing . Chills or fevers . Persistent headaches . Shortness of breath or difficulty breathing . Bloody stools or blood in your urine . Lumps, bumps, swelling and/or nipple discharge . Nausea, vomiting, diarrhea, loss of appetite, or trouble swallowing . A cough that doesn't go away . Abdominal pain . Swelling in your arms or legs . Fractures . Any other signs mentioned by your doctor or nurse or any unusual symptoms                 that you just can't explain   NOTE: Just because you have certain symptoms, it doesn't mean the cancer has come back or you have a new cancer. Symptoms can be due to other problems that need to be addressed.  It is important to watch for these symptoms and report them to your provider so you can be medically evaluated for any of these concerns!      What Now?  Thriving & Surviving In Your Life After Cancer   Consider participating in "Finding Your New Normal" [facility]) survivorship series.   [employer] fitness program for cancer survivors.   Keep  your follow-up appointments with all of your specialists (Speech Therapist, Dietician, Physical Therapist, Dentist, etc.)  Consider FREE counseling at cancer center.  For more information, contact Ottis Stain at [phone].  Attend support group and other Patient & Family Support  Center class/program offerings.   Stop smoking or continue to abstain from smoking. If you need help with smoking cessation, talk to your healthcare team about different options to help you!  Limit alcohol consumption or abstain from consuming alcohol all together.   Maintain adequate nutrition & fluid intake    Thank you so much for allowing [facility] to care for you during your cancer experience.  Our continued commitment is to you and your caregiver(s) health and happiness and again, congratulations on completing treatment & transitioning to survivorship!     Survivorship Nurse Practitioner contact:  Mike Craze, NP Survivorship Program [facility] [address] [place], [place] [address] [phone]

## 2015-12-23 NOTE — Progress Notes (Signed)
CLINIC:  Survivorship    REASON FOR VISIT:  Survivorship Care Plan visit & follow-up post-treatment for a recent history of head & neck cancer.  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   12/23/2014 - 02/03/2015 Chemotherapy Concurrent chemoradiation. Weekly Cisplatin 30 mg/m2 x 7 cycles Alvy Bimler)   12/23/2014 - 02/10/2015 Radiation Therapy IMRT tomotherapy Valere Dross). Total dose: Nasopharynx/neck adenopathy 70 Gy.  Total dose to high-risk lymph nodes: 59.5 Gy. Total dose to low-risk lymph nodes: 56 Gy.  Completed therapy in 35 fractions.    02/14/2015 - 02/16/2015 Hospital Admission Admission for hyperkalemia, hyperglycemia, and acute salivary gland infection    02/14/2015 Imaging CT Neck wo Contrast:  Marked involution previously noted LEFT nasopharyngeal mass.  Residual but improved bilateral cervical adenopathy representing metastatic disease.       06/17/2015 Imaging PET CT scan showed near complete resolution of disease   06/24/2015 Procedure He had IR removal of port and feeding tube   10/16/2015 Imaging CT scan showed no evidence of cancer    INTERVAL HISTORY:  Mr. Bonsell presents to the [facility] today for our initial meeting to review the survivorship care plan detailing his treatment course for head & neck cancer, as well as monitoring long-term side effects of that treatment, education regarding  health maintenance, screening, and overall wellness and health promotion.     Overall, Mr. Hanner reports feeling quite well since his last visit to the cancer center in 10/2015.  He reports having some cold intolerance and fatigue. He still has some soreness on his tongue, but tells me that it is drastically improved from previous.  He only uses the Magic Mouthwash occasionally now.  He tells me that he saw Dr. Redmond Baseman in 11/2015, who performed a biopsy of the tongue ulceration, which came back benign per the patient's report.  He has new dentures that have been helpful in him being able to eat better.  He is not doing his trismus exercises consistently.  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     Pain: Mild tongue pain at times, otherwise no pain.   Nutritional Status:  -Intake/Diet: Regular diet -Using a feeding tube? No -Weight change: LOSS ~ 1 lb since 10/17/15  Swallowing:  Endorses periodic dysphagia with some raw foods; he has trouble with raw lettuce/spinach, but can eat them cooked without difficulty.    Last ENT visit: Redmond Baseman (11/2015)  Last Dentist visit:  [facility]; has new full upper and lower dentures   Summary of last Med Onc visit: [facility] (10/17/15)-CT without evidence of disease. Transition med onc care to cancer survivorship clinic. Mucositis improving; followed by PCP for diabetes and HTN management.   Summary of last Rad Onc visit:  Isidore Moos (08/22/15)-Recovering from effects of radiation; mucositis on tongue with ulceration; started on Diflucan to help with mucositis; continue magic mouthwash. See survivorship  in June; RTC within 3 months of subsequent ENT visit.   Last TSH value: TSH 3.091 on 06/17/15 (TSH collected today)  Last Imaging:  CT neck (10/16/15)-IMPRESSION: No findings concerning for residual nasopharyngeal carcinoma.   ADDITIONAL REVIEW OF SYSTEMS:  Review of Systems  Constitutional: Positive for  malaise/fatigue. Negative for fever and chills.       (+) fatigue  HENT: Negative for ear pain, hearing loss, nosebleeds, sore throat and tinnitus.        -(+) xerostomia -healing tongue ulcer (becoming less painful and not requiring magic mouthwash as often as previous) "I feel like it's getting better"   Eyes: Negative for blurred vision.  Respiratory: Negative for shortness of breath.   Cardiovascular: Negative for chest pain and palpitations.  Gastrointestinal: Negative for nausea, vomiting, abdominal pain, diarrhea and constipation.  Genitourinary: Negative for dysuria.  Neurological: Negative for dizziness and headaches.  Psychiatric/Behavioral: Negative for depression.     PAST MEDICAL/SURGICAL HISTORY:  Past Medical History  Diagnosis Date  . Hyperlipidemia   . Hypertension   . Complication of anesthesia   . PONV (postoperative nausea and vomiting)   . Diabetes mellitus without complication ([place])     Type II  . Chronic kidney disease     Renal Insuffiency , Creatinine has been in the 2- 3 range, 11/27/14 Creatine 1.4  . Gout   . Arthritis   . Anxiety     Claustrophobia  . Cancer [facility])     nasopharynx   Past Surgical History  Procedure Laterality Date  . Lymph node biopsy    . Colonoscopy    . Knee surgery Left 1990's    tibia crushed, pinned and bone grafted  . Colonoscopy w/ polypectomy    . Multiple extractions with alveoloplasty N/A 12/06/2014    Procedure: Extraction of tooth #'s 2,4,5,6,7,8,9,10,11,12,13,14,15, 20,22,26,27,28,29 with alveoloplasty.;  Surgeon: Lenn Cal, DDS;  Location: [facility];  Service: Oral Surgery;  Laterality: N/A;     ALLERGIES:  No Known Allergies    CURRENT MEDICATIONS:  Current Outpatient Prescriptions on File Prior to Visit  Medication Sig Dispense Refill  . acetaminophen (TYLENOL) 500 MG tablet Take 500-1,000 mg by mouth every 6 (six) hours as needed for mild pain. Reported on 08/22/2015    . feeding supplement, GLUCERNA  SHAKE, (GLUCERNA SHAKE) LIQD Take 237 mLs by mouth daily.     . fluconazole (DIFLUCAN) 100 MG tablet Take 2 tablets today, then 1 tablet daily x 20 more days. 22 tablet 0  . Lactobacillus (ACIDOPHILUS) 100 MG CAPS Take by mouth 3 (three) times daily.    . magic mouthwash w/lidocaine SOLN Take 30 mLs by mouth 4 (four) times daily as needed for mouth pain. 960 mL 0  . Multiple Vitamins-Minerals (CENTRUM SILVER PO) Take by mouth daily.    . naproxen sodium (ANAPROX) 220 MG tablet Take 220 mg by mouth 2 (two) times daily with a meal.    . Omega-3 Fatty Acids (FISH OIL PO) Take by mouth daily.    . sodium chloride (OCEAN) 0.65 % SOLN nasal spray Place 1 spray into both nostrils as needed for congestion. Reported on 08/22/2015     No current facility-administered medications on file prior to visit.       ONCOLOGIC FAMILY HISTORY:  Family History  Problem Relation Age of Onset  . Cancer Mother     throat ca  . Cancer Father     lung ca  . Cancer Brother  throat ca  . Heart block Brother       SOCIAL HISTORY:  Mr. Gaunce is married and lives in [place], [place].  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 He enjoys this job and works about 12-16 hours/week.  He is a former smoker.  He began smoking sometime in middle school and was a heavy smoker later in life for many years.  He quit in 07/1998 and has not smoked since.  He does not drink alcohol.     PHYSICAL EXAMINATION:  Vital Signs: Filed Vitals:   12/22/15 1350  BP: 149/93  Pulse: 83  Temp: 98.4 F (36.9 C)  Resp: 14     Weigh Date  154 lb 6.4 oz (70.035 kg) 12/22/15  155 lb 3.2 oz (70.398 kg) 10/17/15  158 lb 1.6 oz (71.714 kg) 08/22/15  161 lb 12.8 oz (73.392 kg) 06/18/15  159 lb 8 oz (72.349 kg) 03/31/15  146 lb 14.4 oz (66.633 kg) 02/27/15  149 lb 4.8 oz (67.722 kg) 02/11/15  155 lb 8 oz (70.534 kg) 02/03/15  172 lb (78.019 kg) 01/14/15  173 lb  4.8 oz (78.608 kg) 01/06/15  178 lb 1.6 oz (80.786 kg) Pre-treatment: 11/26/14   General: Well-appearing male in no acute distress. Unaccompanied today.  HEENT: Head is normocephalic.  Pupils equal and reactive to light. Conjunctivae clear without exudate.  Sclerae anicteric. Note: Patient unable to remove dentures for exam.  Oral mucosa is pink and slightly dry.  Tongue is pink,dry, and mildly furry; there is a central tongue ulceration measuring about 2 x 3 cm. No palpable nodularity to tongue or buccal mucosa. Posterior oropharynx is pink with mild telangectasia associated with radiation changes. Lymph: No cervical, supraclavicular, or infraclavicular lymphadenopathy noted on palpation.   Neck: No palpable masses. Mild fibrosis to anterior neck.  Very mild/scant submental lymphedema.  Cardiovascular: Normal rate and rhythm. Respiratory: Clear to auscultation bilaterally. Chest expansion symmetric without accessory muscle use on inspiration or expiration.   GI: Abdomen soft and round. No tenderness to palpation. Bowel sounds normoactive.  GU: Deferred.   Neuro: No focal deficits. Steady gait.   Psych: Normal mood and affect for situation. Extremities: No edema.   Skin: Warm and dry.   Of note: Baron Hamper, Student Nurse Practitioner from [facility] was present for today's visit and during the physical exam of this patient.    LABORATORY DATA:   Ref. Range 12/22/2015 13:27  TSH Latest Ref Range: 0.320-4.118 m(IU)/L 2.103    DIAGNOSTIC IMAGING:  None for this visit.     ASSESSMENT AND PLAN:  Mr. Cury is a pleasant 70 y.o. male with history of Stage III (T2N2M0) nasopharyngeal cancer, treated with concurrent chemoradiation with Cisplatin; completed treatment on 02/10/15.  Patient presents to survivorship clinic today for routine follow-up after finishing treatment.   1. Cancer of the nasopharynx:  Mr. Adragna is continuing to recover from the effects of cancer treatment.  Per  surveillance protocol, he will follow-up with his ENT physician, Dr. Redmond Baseman, in 03/2016 with history and physical exam.  He will see Dr. Isidore Moos for subsequent follow-up in 06/2016. Today, a comprehensive survivorship care plan and treatment summary was reviewed with the patient today detailing his head & neck cancer diagnosis, treatment course, potential late/long-term effects of treatment, appropriate follow-up care with recommendations for the future, and patient education resources.  A copy of this summary, along with a letter will be  sent to the patient's primary care provider via mail/fax/In Basket message after today's visit.  Mr. Sergio is welcome to return to the Survivorship Clinic in the future, as needed.  When he is approximately 2 years out from his diagnosis and treatment, he may be referred back to Survivorship for continued surveillance until his 5 year "graduation" from follow-up at the cancer center.  He understands this plan and agrees with it.    2. Healing oral mucositis:  The ulceration on his tongue remains, but appears to be healing. There is some granulation tissue present.  Clinically, his symptoms are improving, which is encouraging.  As reported by the patient, the ulceration was biopsied and was reportedly benign.  I will request those records from Dr. Redmond Baseman' office so that we may have a copy for our records. He will continue to use the Magic Mouthwash as needed.    3. At risk for trismus: Mr. Lodato remains at risk for trismus given his treatment for head & neck cancer. He still has his "bite sticks" and I encouraged him to continue to perform the trismus exercises as instructed by Dr. Enrique Sack.  We discussed the importance of maintaining jaw strength and to prevent potential chronic issues with trismus or TMJ concerns.    4. Nutrition/Hydration status: Mr. Wisecup reports that he is currently able to consume adequate nutrition by mouth.  His weight is largely stable since having his  G-tube removed about 6 months ago, which is encouraging.  I encouraged him to continue to try more foods, as tolerated.  He feels that his hydration status is adequate, as he has to drink quite a bit of water due to xerostomia.  Clinically, he does not appear to be dehydrated.  Overall, his nutrition and hydration appear to be adequate at this time.    5. At risk for dysphagia: Given Mr. Fehlman treatment for nasopharyngeal cancer, which included chemo & radiation therapy, he is at risk for chronic dysphagia.  He reports having difficulty with some fresh vegetables, like lettuce, but is able to make modifications to accommodate this difficulty.  He is able to consume other foods without difficulty.   I encouraged him to continue to perform the swallowing exercises, as directed by Garald Balding, SLP.  If Mr. Reuter requires further swallowing or speech therapy evaluation, I will be happy to place that referral, if needed.  Again, his weight is stable, which is encouraging.  Currently, the patient's reported swallowing concerns are stable.  He understands that if his symptoms worsen, then a referral can be made either back to ENT or to GI for consideration of possible esophageal dilation.   6.  At risk for neck lymphedema:  When patients with head & neck cancers are treated with surgery and/or radiation therapy, there is an associated increased risk of neck lymphedema.  Mr. Creager reports that currently he is experiencing what would be considered very mild symptoms.  If his symptoms worsen, I would happy to place a formal referral to physical therapy for further evaluation and treatment.  His exam does not warrant further evaluation at this time.    7.  At risk for hypothyroidism: The thyroid gland is often affected after treatment for head & neck cancer.  Mr. Zagorski TSH was checked today and the results were normal (TSH 2.103). He understands that we will continue serial TSH monitoring for at least the next 5  years as part of his routine follow-up and post-cancer treatment care.  8.  Lung cancer screening:  [facility] now offers eligible patients lung cancer screening with a low-dose chest CT to aid in early detection, provide more effective treatment options, and ultimately improve survival benefits for patients diagnosed early.  Below is the selection criteria for screening:  . Medicare patients: 55-77 years; privately insured patients 55-80 years. . Active or former smokers who have quit within the last 15 years. . 30+ pack-year history of smoking  . Exclusion criteria - No signs/symptoms of lung cancer (i.e., no recent history of hemoptysis and no unexplained weight loss >15 pounds in the last 6 months). . Willing and healthy enough to undergo biopsies/surgery if needed.  Mr. Bregman is eligible for lung cancer screening with low-dose CT chest, but he is not interested in pursuing this test at this time.      9. Tobacco & alcohol use: Mr. Considine reports that he quit smoking in 2000 and continues to abstain from all tobacco products.  I congratulated his continued efforts to remain tobacco free.  I also reinforced the importance of avoiding alcohol consumption as well.  Both tobacco and alcohol use in patients with head & neck cancer increases the risk of recurrence.  They also increase the risk of other cancers, as well.  Mr. Bayles states that he voiced understanding of the importance of continuing to remain both tobacco and alcohol-free.    10. Health maintenance and wellness promotion: Cancer patients who consume a diet rich in fruits and vegetables have better overall health and decreased risk of cancer recurrence. Mr. Jha was encouraged to consume 5-7 servings of fruits and vegetables per day, as tolerated. We reviewed the "Nutrition Rainbow" handout. He was also encouraged to engage in moderate to vigorous exercise for 30 minutes per day most days of the week. We discussed the [employer]  fitness program, which is designed for cancer survivors to help them become more physically fit after cancer treatments. He was given a Engineer, civil (consulting) with instructions on how to enroll in the program if he chooses to do so.  He understands that physical activity will also help combat his fatigue complaints.    11. Support services/counseling: It is not uncommon for this period of the patient's cancer care trajectory to be one of many emotions and stressors.  He and his wife have previously participated in the [facility] support group series, designed for patients after they have completed treatment, and found the series very helpful.   Mr. Gewirtz was encouraged to take advantage of our many other support services programs, support groups, and/or counseling in coping with his new life as a cancer survivor after completing anti-cancer treatment.  He was offered support today through active listening and expressive supportive counseling.  Overall, he seems to have effective coping and support systems in place.  Mr. Forsha was given information regarding our available services and encouraged to contact me with any questions or for help enrolling in any of our support group/programs.    Dispo:  -See Dr. Redmond Baseman (ENT) in August or Septemper 2017 -Return to cancer center to see Dr. Isidore Moos in 06/2016 (appt made prior to leaving clinic today) -Consider referring the patient back to survivorship for long-term surveillance, when clinically appropriate.    A total of 50 minutes of face-to-face time was spent with this patient with greater than 50% of that time in counseling and care-coordination.   Mike Craze, NP Survivorship Program [facility]  [id number]   Note: PRIMARY CARE PROVIDER  Melinda Crutch, MD [place] Fax: [phone]

## 2015-12-25 ENCOUNTER — Telehealth: Payer: Self-pay | Admitting: *Deleted

## 2015-12-25 NOTE — Telephone Encounter (Signed)
  Oncology Nurse Navigator Documentation  Navigator Location: [facility]-Med Onc (12/25/15 1325) Navigator Encounter Type: Telephone (12/25/15 1325) Telephone: Outgoing Call (12/25/15 1325)                     [facility] for [facility] E[facility] Medical Records, requesting copies of patient's recent office visit notes and biopsy report.  Provided fax number.  Gayleen Orem, RN, BSN, [place] [facility] [place] [phone]                       Time Spent with Patient: 15 (12/25/15 1325)

## 2016-03-17 DIAGNOSIS — Z23 Encounter for immunization: Secondary | ICD-10-CM | POA: Diagnosis not present

## 2016-06-08 DIAGNOSIS — H40053 Ocular hypertension, bilateral: Secondary | ICD-10-CM | POA: Diagnosis not present

## 2016-06-08 DIAGNOSIS — H25013 Cortical age-related cataract, bilateral: Secondary | ICD-10-CM | POA: Diagnosis not present

## 2016-06-08 DIAGNOSIS — H353111 Nonexudative age-related macular degeneration, right eye, early dry stage: Secondary | ICD-10-CM | POA: Diagnosis not present

## 2016-06-16 ENCOUNTER — Ambulatory Visit: Payer: Medicare Other | Admitting: Radiation Oncology

## 2016-06-16 ENCOUNTER — Encounter: Payer: Self-pay | Admitting: Radiation Oncology

## 2016-06-16 DIAGNOSIS — M109 Gout, unspecified: Secondary | ICD-10-CM | POA: Diagnosis not present

## 2016-06-16 DIAGNOSIS — E782 Mixed hyperlipidemia: Secondary | ICD-10-CM | POA: Diagnosis not present

## 2016-06-16 DIAGNOSIS — I10 Essential (primary) hypertension: Secondary | ICD-10-CM | POA: Diagnosis not present

## 2016-06-16 DIAGNOSIS — Z125 Encounter for screening for malignant neoplasm of prostate: Secondary | ICD-10-CM | POA: Diagnosis not present

## 2016-06-16 DIAGNOSIS — M62838 Other muscle spasm: Secondary | ICD-10-CM | POA: Diagnosis not present

## 2016-06-16 DIAGNOSIS — R5383 Other fatigue: Secondary | ICD-10-CM | POA: Diagnosis not present

## 2016-06-16 DIAGNOSIS — Z0000 Encounter for general adult medical examination without abnormal findings: Secondary | ICD-10-CM | POA: Diagnosis not present

## 2016-06-16 DIAGNOSIS — K4090 Unilateral inguinal hernia, without obstruction or gangrene, not specified as recurrent: Secondary | ICD-10-CM | POA: Diagnosis not present

## 2016-06-16 DIAGNOSIS — E119 Type 2 diabetes mellitus without complications: Secondary | ICD-10-CM | POA: Diagnosis not present

## 2016-06-16 NOTE — Progress Notes (Signed)
  Adam Yang presents for follow up of radiation completed 02/10/15 to his Nasopharynx/ neck adenopathy.  Pain issues, if any: He denies.  Using a feeding tube?: No, removed December 2016 Weight changes, if any:  Wt Readings from Last 3 Encounters:  06/18/16 152 lb 9.6 oz (69.2 kg)  12/22/15 154 lb 6.4 oz (70 kg)  10/17/15 155 lb 3.2 oz (70.4 kg)   Swallowing issues, if any: He reports that his throat feels "swollen" at times, but he is able to swallow well. He needs to drink while eating, and does not eat meat much due to loss of his teeth for treatment.  Smoking or chewing tobacco? No Using fluoride trays daily? N/A Last ENT visit was on: Dr. Redmond Baseman 11/03/15 ? If seen in September per Gretchen's note. He cancelled his appointment in September because his mouth sores had healed, and felt like he didn't need to see him again.  Other notable issues, if any:  He reports constant dry mouth since after completing treatment and carries a water bottle with him at all times. He has trouble sleeping at night.  He reports decreased energy levels since his treatment. He does not work in his yard like he used to.    BP (!) 166/90   Pulse (!) 102   Temp 97.9 F (36.6 C)   Ht 5\' 8"  (1.727 m)   Wt 152 lb 9.6 oz (69.2 kg)   SpO2 100% Comment: room air  BMI 23.20 kg/m

## 2016-06-18 ENCOUNTER — Encounter: Payer: Self-pay | Admitting: Radiation Oncology

## 2016-06-18 ENCOUNTER — Ambulatory Visit
Admission: RE | Admit: 2016-06-18 | Discharge: 2016-06-18 | Disposition: A | Discharge status: Home / Self Care | Payer: Medicare Other | Source: Ambulatory Visit | Attending: Radiation Oncology | Admitting: Radiation Oncology

## 2016-06-18 ENCOUNTER — Encounter: Payer: Self-pay | Admitting: *Deleted

## 2016-06-18 ENCOUNTER — Telehealth: Payer: Self-pay

## 2016-06-18 VITALS — BP 166/90 | HR 102 | Temp 97.9°F | Ht 68.0 in | Wt 152.6 lb

## 2016-06-18 DIAGNOSIS — Z923 Personal history of irradiation: Secondary | ICD-10-CM | POA: Insufficient documentation

## 2016-06-18 DIAGNOSIS — Z85818 Personal history of malignant neoplasm of other sites of lip, oral cavity, and pharynx: Secondary | ICD-10-CM | POA: Diagnosis not present

## 2016-06-18 DIAGNOSIS — Z5189 Encounter for other specified aftercare: Secondary | ICD-10-CM | POA: Insufficient documentation

## 2016-06-18 DIAGNOSIS — Z85819 Personal history of malignant neoplasm of unspecified site of lip, oral cavity, and pharynx: Secondary | ICD-10-CM

## 2016-06-18 DIAGNOSIS — K1230 Oral mucositis (ulcerative), unspecified: Secondary | ICD-10-CM | POA: Insufficient documentation

## 2016-06-18 DIAGNOSIS — R5383 Other fatigue: Secondary | ICD-10-CM | POA: Diagnosis not present

## 2016-06-18 DIAGNOSIS — K117 Disturbances of salivary secretion: Secondary | ICD-10-CM | POA: Insufficient documentation

## 2016-06-18 DIAGNOSIS — C113 Malignant neoplasm of anterior wall of nasopharynx: Secondary | ICD-10-CM | POA: Insufficient documentation

## 2016-06-18 DIAGNOSIS — Z79899 Other long term (current) drug therapy: Secondary | ICD-10-CM | POA: Insufficient documentation

## 2016-06-18 DIAGNOSIS — Z08 Encounter for follow-up examination after completed treatment for malignant neoplasm: Secondary | ICD-10-CM | POA: Diagnosis not present

## 2016-06-18 HISTORY — DX: Personal history of irradiation: Z92.3

## 2016-06-18 LAB — TSH: TSH: 2.824 m(IU)/L (ref 0.320–4.118)

## 2016-06-18 MED ORDER — LARYNGOSCOPY SOLUTION RAD-ONC
15.0000 mL | Freq: Once | TOPICAL | Status: AC
  Administered 2016-06-18: 15 mL via TOPICAL
  Filled 2016-06-18: qty 15

## 2016-06-18 MED ORDER — RADIAPLEXRX EX GEL
Freq: Once | CUTANEOUS | Status: DC
Start: 2016-06-18 — End: 2016-06-18

## 2016-06-18 NOTE — Telephone Encounter (Signed)
I called and spoke to Adam Yang and let him know that his Thyroid function was normal today and not the cause of his fatigue. He reported to me that he just saw his PCP several days ago and they did some blood work, but he has not received the results yet. He voiced his appreciation for my phone call and knows to call if he has any further questions or concerns.

## 2016-06-18 NOTE — Progress Notes (Signed)
Radiation Oncology         [phone] ________________________________  Name: Adam Yang MRN: [id number]  Date: 06/18/2016  DOB: 18-Dec-1945  Follow-Up Visit Note  Outpatient  CC: Melinda Crutch, MD  Melida Quitter, MD  Diagnosis      ICD-9-CM ICD-10-CM   1. 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 Other fatigue 780.79 R53.83 TSH  3. 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  The patient denies any pain. His feeding tube was removed 06/2015.  The patient reports that his throat feels "swollen" at times, but that he is able to swallow well. He needs to drink while eating, and does not eat meat very often due to loss of his teeth for treatment. He denies smoking or chewing tobacco. His last ENT was with Dr. Redmond Baseman on 11/03/15. The patient reports constant dry mouth since completing treatment and he carries a water bottle with him at all times. He reports difficulty sleeping at night. The patient reports decreased energy levels since his treatment, and he does not work in the yard like he used to.  ALLERGIES:  has No Known Allergies.  Meds: Current Outpatient Prescriptions  Medication Sig Dispense Refill  . Multiple Vitamins-Minerals (CENTRUM SILVER PO) Take by mouth daily.    . naproxen sodium (ANAPROX) 220 MG tablet Take 220 mg by mouth 2 (two) times daily with a meal.    . Omega-3 Fatty Acids (FISH OIL PO) Take by mouth daily.    Marland Kitchen amLODipine (NORVASC) 5 MG tablet Take 5 mg by mouth.    . feeding supplement, GLUCERNA SHAKE, (GLUCERNA SHAKE) LIQD Take 237 mLs by mouth daily.     . sodium chloride (OCEAN)  0.65 % SOLN nasal spray Place 1 spray into both nostrils as needed for congestion. Reported on 08/22/2015     No current facility-administered medications for this encounter.    Physical Findings: The patient is in no acute distress. Patient is alert and oriented.  height is 5\' 8"  (1.727 m) and weight is 152 lb 9.6 oz (69.2 kg). His temperature is 97.9 F (36.6 C). His blood pressure is 166/90 (abnormal) and his pulse is 102 (abnormal). His oxygen saturation is 100%. .  General: Alert and oriented, in no acute distress HEENT: The patient has dentures which were removed for the exam. White layer over the tongue noted, slightly hairy and white in appearance, to the surface of the tongue consistent with mucosal changes from past therapy. The central portion of the surface of the tongue does not have this appearance and is clear. No sign of tumor in oropharynx or oral cavity. Neck: Neck is supple, no palpable cervical or supraclavicular masses. Heart: Regular in rate and rhythm with no murmurs. Chest: Clear to auscultation bilaterally, with no rhonchi, wheezes, or rales. Abdomen: Soft, nontender, nondistended, with no rigidity or guarding. Extremities: No edema noted. Lymphatics: see Neck Exam Skin: No concerning lesions. Skin over the neck is intact and well healed. Neurologic: No obvious focalities. Speech is fluent. Coordination is intact. Psychiatric: Judgment and insight are intact. Affect is appropriate.  PROCEDURE NOTE: After obtaining verbal consent and anesthetizing the nasal cavity with topical  lidocaine and phenylephrine, the flexible endoscope was introduced and passed through the nasal cavity. Mucous in the nasopharynx but no sign of tumor. No lesions in the pharynx or larynx, cords are symmetrically mobile.  Lab Findings: Lab Results  Component Value Date   WBC 4.9 10/16/2015   HGB 14.4 10/16/2015   HCT 43.4 10/16/2015   MCV 87.8 10/16/2015   PLT 185 10/16/2015   Lab Results    Component Value Date   TSH 2.103 12/22/2015    Radiographic Findings: No results found.  Impression/Plan:  Mr. Carrete is recovering from the effects of radiation. No evidence of recurrence.  Fatigue:  We will recheck the patient's TSH today and evaluate him for need of supplements. I encouraged him to make sure he is staying hydrated and eating as much protein as he can.He declines PT for reconditioning. Lab Results  Component Value Date   TSH 2.103 12/22/2015   Xerostomia:  I encouraged the patient to eat food with sauces and gravies to soften the food and lubricate his mouth. For his dry mouth, I recommend Biotene products as needed, particularly the Biotene gel which may last longer.  Mucositis: I advised the patient to try using a tongue scraper for the white residue on his tongue.   Should the patient need any prescriptions, he prefers the [facility] on [address].  The patient will see Dr. Redmond Baseman in April, and then Mike Craze in August. Following that appointment the patient will be scheduled to follow up with Dr Redmond Baseman again, then me in Radiation Oncology. I encouraged the patient to contact me in the interim with any questions or concerns that may arise.  _____________________________________   Eppie Gibson, MD  This document serves as a record of services personally performed by Eppie Gibson, MD. It was created on her behalf by Maryla Morrow, a trained medical scribe. The creation of this record is based on the scribe's personal observations and the provider's statements to them. This document has been checked and approved by the attending provider.

## 2016-06-18 NOTE — Progress Notes (Signed)
Oncology Nurse Navigator Documentation  Met with Adam Yang during routine follow-up with Dr. Isidore Moos.  He completed concurrent chemo/XRT nasopharyngeal carcinoma 02/10/15. 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 Reported extreme fatigue.  Dr. Isidore Moos to check TSH.  I offered his consideration of [facility] program at [facility], he was not interested.  Assisted Dr. Isidore Moos with laryngoscopy.   He tolerated procedure without difficulty. He denied needs/concerns, understands he can contact me.  Gayleen Orem, RN, BSN, [place] [facility] Oncology Nurse [place] at [place] [phone]

## 2016-06-21 ENCOUNTER — Telehealth: Payer: Self-pay | Admitting: *Deleted

## 2016-06-23 NOTE — Telephone Encounter (Signed)
Oncology Nurse Navigator Documentation  Per Dr. Pearlie Oyster guidance, called [facility] ENT to arrange routine post-XRT follow-up appointment.  Spoke with Kalman Shan, requested patient be contacted and appt arranged with Dr. Redmond Baseman in April 2018.  She verbalized understanding.  Gayleen Orem, RN, BSN, [place] [facility] Oncology Nurse [place] at [place] [phone]

## 2016-11-01 DIAGNOSIS — R1313 Dysphagia, pharyngeal phase: Secondary | ICD-10-CM | POA: Diagnosis not present

## 2016-11-01 DIAGNOSIS — C112 Malignant neoplasm of lateral wall of nasopharynx: Secondary | ICD-10-CM | POA: Diagnosis not present

## 2016-11-04 ENCOUNTER — Other Ambulatory Visit: Payer: Self-pay | Admitting: Otolaryngology

## 2016-11-04 DIAGNOSIS — R1313 Dysphagia, pharyngeal phase: Secondary | ICD-10-CM

## 2016-11-22 ENCOUNTER — Ambulatory Visit
Admission: RE | Admit: 2016-11-22 | Discharge: 2016-11-22 | Disposition: A | Discharge status: Home / Self Care | Payer: Medicare Other | Source: Ambulatory Visit | Attending: Otolaryngology | Admitting: Otolaryngology

## 2016-11-22 DIAGNOSIS — R1313 Dysphagia, pharyngeal phase: Secondary | ICD-10-CM

## 2016-11-22 DIAGNOSIS — K229 Disease of esophagus, unspecified: Secondary | ICD-10-CM | POA: Diagnosis not present

## 2016-12-22 DIAGNOSIS — I10 Essential (primary) hypertension: Secondary | ICD-10-CM | POA: Diagnosis not present

## 2016-12-22 DIAGNOSIS — E119 Type 2 diabetes mellitus without complications: Secondary | ICD-10-CM | POA: Diagnosis not present

## 2016-12-22 DIAGNOSIS — E782 Mixed hyperlipidemia: Secondary | ICD-10-CM | POA: Diagnosis not present

## 2017-02-18 ENCOUNTER — Telehealth: Payer: Self-pay | Admitting: Hematology and Oncology

## 2017-02-18 ENCOUNTER — Ambulatory Visit (HOSPITAL_BASED_OUTPATIENT_CLINIC_OR_DEPARTMENT_OTHER): Payer: Medicare Other | Admitting: Hematology and Oncology

## 2017-02-18 ENCOUNTER — Other Ambulatory Visit: Payer: Self-pay | Admitting: Hematology and Oncology

## 2017-02-18 ENCOUNTER — Ambulatory Visit (HOSPITAL_BASED_OUTPATIENT_CLINIC_OR_DEPARTMENT_OTHER): Payer: Medicare Other

## 2017-02-18 ENCOUNTER — Encounter: Payer: Self-pay | Admitting: Hematology and Oncology

## 2017-02-18 ENCOUNTER — Encounter: Payer: Self-pay | Admitting: Adult Health

## 2017-02-18 VITALS — BP 157/75 | HR 91 | Temp 98.0°F | Resp 20 | Ht 68.0 in | Wt 154.6 lb

## 2017-02-18 DIAGNOSIS — R5383 Other fatigue: Secondary | ICD-10-CM | POA: Diagnosis not present

## 2017-02-18 DIAGNOSIS — C113 Malignant neoplasm of anterior wall of nasopharynx: Secondary | ICD-10-CM

## 2017-02-18 DIAGNOSIS — Z85819 Personal history of malignant neoplasm of unspecified site of lip, oral cavity, and pharynx: Secondary | ICD-10-CM

## 2017-02-18 DIAGNOSIS — N183 Chronic kidney disease, stage 3 unspecified: Secondary | ICD-10-CM | POA: Insufficient documentation

## 2017-02-18 DIAGNOSIS — Z85818 Personal history of malignant neoplasm of other sites of lip, oral cavity, and pharynx: Secondary | ICD-10-CM

## 2017-02-18 DIAGNOSIS — I10 Essential (primary) hypertension: Secondary | ICD-10-CM | POA: Diagnosis not present

## 2017-02-18 LAB — COMPREHENSIVE METABOLIC PANEL
ALT: 12 U/L (ref 0–55)
ANION GAP: 8 meq/L (ref 3–11)
AST: 16 U/L (ref 5–34)
Albumin: 3 g/dL — ABNORMAL LOW (ref 3.5–5.0)
Alkaline Phosphatase: 58 U/L (ref 40–150)
BUN: 30.9 mg/dL — AB (ref 7.0–26.0)
CALCIUM: 9.6 mg/dL (ref 8.4–10.4)
CHLORIDE: 104 meq/L (ref 98–109)
CO2: 27 meq/L (ref 22–29)
Creatinine: 1.6 mg/dL — ABNORMAL HIGH (ref 0.7–1.3)
EGFR: 42 mL/min/{1.73_m2} — ABNORMAL LOW (ref >90)
Glucose: 108 mg/dl (ref 70–140)
POTASSIUM: 4.3 meq/L (ref 3.5–5.1)
Sodium: 139 mEq/L (ref 136–145)
Total Bilirubin: 0.25 mg/dL (ref 0.20–1.20)
Total Protein: 7 g/dL (ref 6.4–8.3)

## 2017-02-18 LAB — CBC WITH DIFFERENTIAL/PLATELET
BASO%: 0.6 % (ref 0.0–2.0)
Basophils Absolute: 0 10*3/uL (ref 0.0–0.1)
EOS%: 4.4 % (ref 0.0–7.0)
Eosinophils Absolute: 0.3 10*3/uL (ref 0.0–0.5)
HCT: 35.9 % — ABNORMAL LOW (ref 38.4–49.9)
HEMOGLOBIN: 12.2 g/dL — AB (ref 13.0–17.1)
LYMPH%: 20.3 % (ref 14.0–49.0)
MCH: 28.6 pg (ref 27.2–33.4)
MCHC: 34 g/dL (ref 32.0–36.0)
MCV: 84.1 fL (ref 79.3–98.0)
MONO#: 0.7 10*3/uL (ref 0.1–0.9)
MONO%: 12 % (ref 0.0–14.0)
NEUT%: 62.7 % (ref 39.0–75.0)
NEUTROS ABS: 3.6 10*3/uL (ref 1.5–6.5)
Platelets: 254 10*3/uL (ref 140–400)
RBC: 4.26 10*6/uL (ref 4.20–5.82)
RDW: 14.8 % — AB (ref 11.0–14.6)
WBC: 5.7 10*3/uL (ref 4.0–10.3)
lymph#: 1.2 10*3/uL (ref 0.9–3.3)

## 2017-02-18 NOTE — Assessment & Plan Note (Signed)
He has chronic kidney disease stage III due to exposure to prior treatment and other confounding factors such as hypertension  We will continue close observation of his blood work

## 2017-02-18 NOTE — Assessment & Plan Note (Signed)
The patient has mild fatigue He is at risk of acquired hypothyroidism due to exposure to radiation treatment I will check TSH and call him with test results I recommend yearly TSH monitoring through his primary care doctor's office If this is not possible, I will continue to check his blood work here

## 2017-02-18 NOTE — Progress Notes (Signed)
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      12/23/2014 - 02/03/2015 Chemotherapy    Concurrent chemoradiation. Weekly Cisplatin 30 mg/m2 x 7 cycles Alvy Bimler)      12/23/2014 - 02/10/2015 Radiation Therapy    IMRT tomotherapy Valere Dross). Total dose: Nasopharynx/neck adenopathy 70 Gy.  Total dose to high-risk lymph nodes: 59.5 Gy. Total dose to low-risk lymph nodes: 56 Gy.  Completed therapy in 35 fractions.       02/14/2015 - 02/16/2015 Hospital Admission    Admission for hyperkalemia, hyperglycemia, and acute salivary gland infection       02/14/2015 Imaging    CT Neck wo Contrast:  Marked involution previously noted LEFT nasopharyngeal mass.  Residual but improved bilateral cervical adenopathy representing metastatic disease.          06/17/2015 Imaging    PET CT scan showed near complete resolution of disease      06/24/2015 Procedure    He had IR removal of port and feeding tube      10/16/2015 Imaging    CT scan showed no evidence of cancer       INTERVAL  HISTORY: Please see below for problem oriented charting. He returns for further follow-up He complained of mild fatigue Denies dysphagia He is active He denies recent new lymphadenopathy  REVIEW OF SYSTEMS:   Constitutional: Denies fevers, chills or abnormal weight loss Eyes: Denies blurriness of vision Ears, nose, mouth, throat, and face: Denies mucositis or sore throat Respiratory: Denies cough, dyspnea or wheezes Cardiovascular: Denies palpitation, chest discomfort or lower extremity swelling Gastrointestinal:  Denies nausea, heartburn or change in bowel habits Skin: Denies abnormal skin rashes Lymphatics: Denies new lymphadenopathy or easy bruising Neurological:Denies numbness, tingling or new weaknesses Behavioral/Psych: Mood is stable, no new changes  All other systems were reviewed with the patient and are negative.  I have reviewed the past medical history, past surgical history, social history and family history with the patient and they are unchanged from previous note.  ALLERGIES:  has No Known Allergies.  MEDICATIONS:  Current Outpatient Prescriptions  Medication Sig Dispense Refill  . amLODipine (NORVASC) 5 MG tablet Take 5 mg by mouth.    . feeding supplement, GLUCERNA SHAKE, (GLUCERNA SHAKE) LIQD Take 237 mLs by mouth daily.     . Multiple Vitamins-Minerals (CENTRUM SILVER PO) Take by mouth daily.    . naproxen sodium (ANAPROX) 220 MG tablet Take 220 mg by mouth 2 (two) times daily with a meal.    .  Omega-3 Fatty Acids (FISH OIL PO) Take by mouth daily.    . sodium chloride (OCEAN) 0.65 % SOLN nasal spray Place 1 spray into both nostrils as needed for congestion. Reported on 08/22/2015     No current facility-administered medications for this visit.     PHYSICAL EXAMINATION: ECOG PERFORMANCE STATUS: 1 - Symptomatic but completely ambulatory  Vitals:   02/18/17 1447  BP: (!) 157/75  Pulse: 91  Resp: 20  Temp: 98 F (36.7 C)  SpO2: 98%   Filed Weights    02/18/17 1447  Weight: 154 lb 9.6 oz (70.1 kg)    GENERAL:alert, no distress and comfortable SKIN: skin color, texture, turgor are normal, no rashes or significant lesions EYES: normal, Conjunctiva are pink and non-injected, sclera clear OROPHARYNX:no exudate, no erythema and lips, buccal mucosa, and tongue normal  NECK: supple, thyroid normal size, non-tender, without nodularity LYMPH:  no palpable lymphadenopathy in the cervical, axillary or inguinal LUNGS: clear to auscultation and percussion with normal breathing effort HEART: regular rate & rhythm and no murmurs and no lower extremity edema ABDOMEN:abdomen soft, non-tender and normal bowel sounds Musculoskeletal:no cyanosis of digits and no clubbing  NEURO: alert & oriented x 3 with fluent speech, no focal motor/sensory deficits  LABORATORY DATA:  I have reviewed the data as listed    Component Value Date/Time   NA 139 02/18/2017 1502   K 4.3 02/18/2017 1502   CL 106 06/24/2015 1259   CO2 27 02/18/2017 1502   GLUCOSE 108 02/18/2017 1502   BUN 30.9 (H) 02/18/2017 1502   CREATININE 1.6 (H) 02/18/2017 1502   CALCIUM 9.6 02/18/2017 1502   PROT 7.0 02/18/2017 1502   ALBUMIN 3.0 (L) 02/18/2017 1502   AST 16 02/18/2017 1502   ALT 12 02/18/2017 1502   ALKPHOS 58 02/18/2017 1502   BILITOT 0.25 02/18/2017 1502   GFRNONAA >60 06/24/2015 1259   GFRAA >60 06/24/2015 1259    No results found for: SPEP, UPEP  Lab Results  Component Value Date   WBC 5.7 02/18/2017   NEUTROABS 3.6 02/18/2017   HGB 12.2 (L) 02/18/2017   HCT 35.9 (L) 02/18/2017   MCV 84.1 02/18/2017   PLT 254 02/18/2017      Chemistry      Component Value Date/Time   NA 139 02/18/2017 1502   K 4.3 02/18/2017 1502   CL 106 06/24/2015 1259   CO2 27 02/18/2017 1502   BUN 30.9 (H) 02/18/2017 1502   CREATININE 1.6 (H) 02/18/2017 1502      Component Value Date/Time   CALCIUM 9.6 02/18/2017 1502   ALKPHOS 58 02/18/2017 1502   AST 16 02/18/2017 1502   ALT 12  02/18/2017 1502   BILITOT 0.25 02/18/2017 1502       ASSESSMENT & PLAN:  History of nasopharyngeal cancer CT scan showed no evidence of disease. Examination is satisfactory. He will continue close ENT follow-up. I will continue to see him once a year with history, physical examination and blood work  Essential hypertension he will continue current medical management. I recommend close follow-up with primary care doctor for medication adjustment.   Chronic kidney disease, stage III (moderate) He has chronic kidney disease stage III due to exposure to prior treatment and other confounding factors such as hypertension  We will continue close observation of his blood work   Other fatigue The patient has mild fatigue He is at risk of acquired hypothyroidism due to exposure to radiation treatment I will check TSH and  call him with test results I recommend yearly TSH monitoring through his primary care doctor's office If this is not possible, I will continue to check his blood work here   Orders Placed This Encounter  Procedures  . Comprehensive metabolic panel    Standing Status:   Future    Number of Occurrences:   1    Standing Expiration Date:   03/25/2018  . CBC with Differential/Platelet    Standing Status:   Future    Number of Occurrences:   1    Standing Expiration Date:   03/25/2018  . TSH    Standing Status:   Future    Number of Occurrences:   1    Standing Expiration Date:   03/25/2018   All questions were answered. The patient knows to call the clinic with any problems, questions or concerns. No barriers to learning was detected. I spent 15 minutes counseling the patient face to face. The total time spent in the appointment was 20 minutes and more than 50% was on counseling and review of test results     Heath Lark, MD 02/18/2017 6:38 PM

## 2017-02-18 NOTE — Assessment & Plan Note (Signed)
he will continue current medical management. I recommend close follow-up with primary care doctor for medication adjustment.  

## 2017-02-18 NOTE — Assessment & Plan Note (Signed)
CT scan showed no evidence of disease. Examination is satisfactory. He will continue close ENT follow-up. I will continue to see him once a year with history, physical examination and blood work

## 2017-02-18 NOTE — Telephone Encounter (Signed)
Scheduled appt per 8/10 los - Gave patient AVS and calender per los.  

## 2017-02-21 ENCOUNTER — Telehealth: Payer: Self-pay

## 2017-02-21 LAB — TSH: TSH: 2.745 m(IU)/L (ref 0.320–4.118)

## 2017-02-21 NOTE — Telephone Encounter (Signed)
-----   Message from Heath Lark, MD sent at 02/21/2017  9:17 AM EDT ----- Regarding: labs pls call him: thyroid test oK His kidney test is elevated but stable ----- Message ----- From: Interface, Lab In Three Zero One Sent: 02/18/2017   3:10 PM To: Heath Lark, MD

## 2017-02-21 NOTE — Telephone Encounter (Signed)
Called with below message. 

## 2017-03-17 DIAGNOSIS — Z23 Encounter for immunization: Secondary | ICD-10-CM | POA: Diagnosis not present

## 2017-03-18 ENCOUNTER — Other Ambulatory Visit: Payer: Self-pay | Admitting: Family Medicine

## 2017-03-18 DIAGNOSIS — R202 Paresthesia of skin: Secondary | ICD-10-CM | POA: Diagnosis not present

## 2017-03-18 DIAGNOSIS — M539 Dorsopathy, unspecified: Secondary | ICD-10-CM | POA: Diagnosis not present

## 2017-03-18 DIAGNOSIS — Z85819 Personal history of malignant neoplasm of unspecified site of lip, oral cavity, and pharynx: Secondary | ICD-10-CM | POA: Diagnosis not present

## 2017-03-28 ENCOUNTER — Ambulatory Visit
Admission: RE | Admit: 2017-03-28 | Discharge: 2017-03-28 | Disposition: A | Discharge status: Home / Self Care | Payer: Medicare Other | Source: Ambulatory Visit | Attending: Family Medicine | Admitting: Family Medicine

## 2017-03-28 DIAGNOSIS — Z85819 Personal history of malignant neoplasm of unspecified site of lip, oral cavity, and pharynx: Secondary | ICD-10-CM

## 2017-03-28 DIAGNOSIS — C119 Malignant neoplasm of nasopharynx, unspecified: Secondary | ICD-10-CM | POA: Diagnosis not present

## 2017-03-28 MED ORDER — GADOBENATE DIMEGLUMINE 529 MG/ML IV SOLN
14.0000 mL | Freq: Once | INTRAVENOUS | Status: AC | PRN
  Administered 2017-03-28: 14 mL via INTRAVENOUS

## 2017-05-30 DIAGNOSIS — E782 Mixed hyperlipidemia: Secondary | ICD-10-CM | POA: Diagnosis not present

## 2017-05-30 DIAGNOSIS — Z923 Personal history of irradiation: Secondary | ICD-10-CM | POA: Diagnosis not present

## 2017-05-30 DIAGNOSIS — I10 Essential (primary) hypertension: Secondary | ICD-10-CM | POA: Diagnosis not present

## 2017-05-30 DIAGNOSIS — E119 Type 2 diabetes mellitus without complications: Secondary | ICD-10-CM | POA: Diagnosis not present

## 2017-06-09 DIAGNOSIS — H40053 Ocular hypertension, bilateral: Secondary | ICD-10-CM | POA: Diagnosis not present

## 2017-06-09 DIAGNOSIS — H3530 Unspecified macular degeneration: Secondary | ICD-10-CM | POA: Diagnosis not present

## 2017-06-09 DIAGNOSIS — E119 Type 2 diabetes mellitus without complications: Secondary | ICD-10-CM | POA: Diagnosis not present

## 2017-06-09 DIAGNOSIS — H40013 Open angle with borderline findings, low risk, bilateral: Secondary | ICD-10-CM | POA: Diagnosis not present

## 2017-06-13 DIAGNOSIS — C112 Malignant neoplasm of lateral wall of nasopharynx: Secondary | ICD-10-CM | POA: Diagnosis not present

## 2017-06-13 DIAGNOSIS — K148 Other diseases of tongue: Secondary | ICD-10-CM | POA: Diagnosis not present

## 2017-06-13 DIAGNOSIS — R1313 Dysphagia, pharyngeal phase: Secondary | ICD-10-CM | POA: Diagnosis not present

## 2017-06-16 ENCOUNTER — Other Ambulatory Visit: Payer: Self-pay | Admitting: Otolaryngology

## 2017-06-17 ENCOUNTER — Other Ambulatory Visit: Payer: Self-pay | Admitting: Otolaryngology

## 2017-06-17 DIAGNOSIS — C112 Malignant neoplasm of lateral wall of nasopharynx: Secondary | ICD-10-CM

## 2017-06-17 DIAGNOSIS — K148 Other diseases of tongue: Secondary | ICD-10-CM

## 2017-08-01 ENCOUNTER — Other Ambulatory Visit: Payer: Self-pay | Admitting: General Surgery

## 2017-08-01 DIAGNOSIS — Z85819 Personal history of malignant neoplasm of unspecified site of lip, oral cavity, and pharynx: Secondary | ICD-10-CM | POA: Diagnosis not present

## 2017-08-01 DIAGNOSIS — K4090 Unilateral inguinal hernia, without obstruction or gangrene, not specified as recurrent: Secondary | ICD-10-CM | POA: Diagnosis not present

## 2017-08-01 DIAGNOSIS — E119 Type 2 diabetes mellitus without complications: Secondary | ICD-10-CM | POA: Diagnosis not present

## 2017-08-02 ENCOUNTER — Other Ambulatory Visit: Payer: Medicare Other

## 2017-08-15 ENCOUNTER — Other Ambulatory Visit: Payer: Self-pay | Admitting: Otolaryngology

## 2017-08-15 ENCOUNTER — Ambulatory Visit
Admission: RE | Admit: 2017-08-15 | Discharge: 2017-08-15 | Disposition: A | Discharge status: Home / Self Care | Payer: Medicare Other | Source: Ambulatory Visit | Attending: Otolaryngology | Admitting: Otolaryngology

## 2017-08-15 DIAGNOSIS — C112 Malignant neoplasm of lateral wall of nasopharynx: Secondary | ICD-10-CM

## 2017-08-15 DIAGNOSIS — K148 Other diseases of tongue: Secondary | ICD-10-CM

## 2017-08-15 DIAGNOSIS — R42 Dizziness and giddiness: Secondary | ICD-10-CM | POA: Diagnosis not present

## 2017-08-18 NOTE — Patient Instructions (Addendum)
Adam Yang  08/18/2017   Your procedure is scheduled on: 08-23-17   Report to [facility] Main  Entrance    Report to admitting at 6AM   Call this number if you have problems the morning of surgery [phone]     Remember: NO SOLID FOOD AFTER MIDNIGHT THE NIGHT PRIOR TO SURGERY. NOTHING BY MOUTH EXCEPT CLEAR LIQUIDS UNTIL 3 HOURS PRIOR TO SCHEDULED SURGERY. PLEASE FINISH ENSURE DRINK PER SURGEON ORDER 3 HOURS PRIOR TO SCHEDULED SURGERY TIME WHICH NEEDS TO BE COMPLETED AT _____5:00AM____.      Take these medicines the morning of surgery with A SIP OF WATER: amlodipine                                 You may not have any metal on your body including hair pins and              piercings  Do not wear jewelry, make-up, lotions, powders or perfumes, deodorant              Men may shave face and neck.   Do not bring valuables to the hospital. Adam Yang.  Contacts, dentures or bridgework may not be worn into surgery.      Patients discharged the day of surgery will not be allowed to drive home.  Name and phone number of your driver:  Special Instructions: N/A              Please read over the following fact sheets you were given: _____________________________________________________________________    CLEAR LIQUID DIET   Foods Allowed                                                                     Foods Excluded  Coffee and tea, regular and decaf                             liquids that you cannot  Plain Jell-O in any flavor                                             see through such as: Fruit ices (not with fruit pulp)                                     milk, soups, orange juice  Iced Popsicles                                    All solid food Carbonated beverages, regular and diet  Cranberry, grape and apple juices Sports drinks like Gatorade Lightly seasoned  clear broth or consume(fat free) Sugar, honey syrup  Sample Menu Breakfast                                Lunch                                     Supper Cranberry juice                    Beef broth                            Chicken broth Jell-O                                     Grape juice                           Apple juice Coffee or tea                        Jell-O                                      Popsicle                                                Coffee or tea                        Coffee or tea  _____________________________________________________________________  [facility] - Preparing for Surgery Before surgery, you can play an important role.  Because skin is not sterile, your skin needs to be as free of germs as possible.  You can reduce the number of germs on your skin by washing with CHG (chlorahexidine gluconate) soap before surgery.  CHG is an antiseptic cleaner which kills germs and bonds with the skin to continue killing germs even after washing. Please DO NOT use if you have an allergy to CHG or antibacterial soaps.  If your skin becomes reddened/irritated stop using the CHG and inform your nurse when you arrive at Short Stay. Do not shave (including legs and underarms) for at least 48 hours prior to the first CHG shower.  You may shave your face/neck. Please follow these instructions carefully:  1.  Shower with CHG Soap the night before surgery and the  morning of Surgery.  2.  If you choose to wash your hair, wash your hair first as usual with your  normal  shampoo.  3.  After you shampoo, rinse your hair and body thoroughly to remove the  shampoo.                           4.  Use CHG as you would any other liquid soap.  You can apply chg directly  to the skin and wash  Gently with a scrungie or clean washcloth.  5.  Apply the CHG Soap to your body ONLY FROM THE NECK DOWN.   Do not use on face/ open                           Wound or  open sores. Avoid contact with eyes, ears mouth and genitals (private parts).                       Wash face,  Genitals (private parts) with your normal soap.             6.  Wash thoroughly, paying special attention to the area where your surgery  will be performed.  7.  Thoroughly rinse your body with warm water from the neck down.  8.  DO NOT shower/wash with your normal soap after using and rinsing off  the CHG Soap.                9.  Pat yourself dry with a clean towel.            10.  Wear clean pajamas.            11.  Place clean sheets on your bed the night of your first shower and do not  sleep with pets. Day of Surgery : Do not apply any lotions/deodorants the morning of surgery.  Please wear clean clothes to the hospital/surgery center.  FAILURE TO FOLLOW THESE INSTRUCTIONS MAY RESULT IN THE CANCELLATION OF YOUR SURGERY PATIENT SIGNATURE_________________________________  NURSE SIGNATURE__________________________________  ________________________________________________________________________ How to Manage Your Diabetes Before and After Surgery  Why is it important to control my blood sugar before and after surgery? . Improving blood sugar levels before and after surgery helps healing and can limit problems. . A way of improving blood sugar control is eating a healthy diet by: o  Eating less sugar and carbohydrates o  Increasing activity/exercise o  Talking with your doctor about reaching your blood sugar goals . High blood sugars (greater than 180 mg/dL) can raise your risk of infections and slow your recovery, so you will need to focus on controlling your diabetes during the weeks before surgery. . Make sure that the doctor who takes care of your diabetes knows about your planned surgery including the date and location.     Patient Signature:  Date:   Nurse Signature:  Date:   Reviewed and Endorsed by [facility] Patient Education Committee, August 2015

## 2017-08-19 ENCOUNTER — Other Ambulatory Visit: Payer: Self-pay

## 2017-08-19 ENCOUNTER — Encounter (HOSPITAL_COMMUNITY): Payer: Self-pay

## 2017-08-19 ENCOUNTER — Encounter (HOSPITAL_COMMUNITY)
Admission: RE | Admit: 2017-08-19 | Discharge: 2017-08-19 | Disposition: A | Discharge status: Home / Self Care | Payer: Medicare Other | Source: Ambulatory Visit | Attending: General Surgery | Admitting: General Surgery

## 2017-08-19 DIAGNOSIS — Z01818 Encounter for other preprocedural examination: Secondary | ICD-10-CM | POA: Diagnosis present

## 2017-08-19 DIAGNOSIS — N183 Chronic kidney disease, stage 3 (moderate): Secondary | ICD-10-CM | POA: Diagnosis not present

## 2017-08-19 DIAGNOSIS — K4090 Unilateral inguinal hernia, without obstruction or gangrene, not specified as recurrent: Secondary | ICD-10-CM | POA: Diagnosis not present

## 2017-08-19 DIAGNOSIS — I129 Hypertensive chronic kidney disease with stage 1 through stage 4 chronic kidney disease, or unspecified chronic kidney disease: Secondary | ICD-10-CM | POA: Diagnosis not present

## 2017-08-19 DIAGNOSIS — E1122 Type 2 diabetes mellitus with diabetic chronic kidney disease: Secondary | ICD-10-CM | POA: Diagnosis not present

## 2017-08-19 HISTORY — DX: Unilateral inguinal hernia, without obstruction or gangrene, not specified as recurrent: K40.90

## 2017-08-19 HISTORY — DX: Cough: R05

## 2017-08-19 HISTORY — DX: Chronic cough: R05.3

## 2017-08-19 LAB — CBC WITH DIFFERENTIAL/PLATELET
BASOS ABS: 0 10*3/uL (ref 0.0–0.1)
BASOS PCT: 0 %
EOS ABS: 0.3 10*3/uL (ref 0.0–0.7)
Eosinophils Relative: 4 %
HEMATOCRIT: 36.5 % — AB (ref 39.0–52.0)
Hemoglobin: 12.1 g/dL — ABNORMAL LOW (ref 13.0–17.0)
Lymphocytes Relative: 20 %
Lymphs Abs: 1.4 10*3/uL (ref 0.7–4.0)
MCH: 26.9 pg (ref 26.0–34.0)
MCHC: 33.2 g/dL (ref 30.0–36.0)
MCV: 81.1 fL (ref 78.0–100.0)
MONO ABS: 0.7 10*3/uL (ref 0.1–1.0)
MONOS PCT: 9 %
NEUTROS ABS: 4.7 10*3/uL (ref 1.7–7.7)
NEUTROS PCT: 67 %
Platelets: 319 10*3/uL (ref 150–400)
RBC: 4.5 MIL/uL (ref 4.22–5.81)
RDW: 14.9 % (ref 11.5–15.5)
WBC: 7.1 10*3/uL (ref 4.0–10.5)

## 2017-08-19 LAB — COMPREHENSIVE METABOLIC PANEL
ALBUMIN: 3.8 g/dL (ref 3.5–5.0)
ALT: 14 U/L — ABNORMAL LOW (ref 17–63)
ANION GAP: 9 (ref 5–15)
AST: 20 U/L (ref 15–41)
Alkaline Phosphatase: 32 U/L — ABNORMAL LOW (ref 38–126)
BUN: 46 mg/dL — AB (ref 6–20)
CHLORIDE: 102 mmol/L (ref 101–111)
CO2: 28 mmol/L (ref 22–32)
Calcium: 9.6 mg/dL (ref 8.9–10.3)
Creatinine, Ser: 2.38 mg/dL — ABNORMAL HIGH (ref 0.61–1.24)
GFR calc Af Amer: 30 mL/min — ABNORMAL LOW (ref >60)
GFR calc non Af Amer: 26 mL/min — ABNORMAL LOW (ref >60)
GLUCOSE: 126 mg/dL — AB (ref 65–99)
POTASSIUM: 4.1 mmol/L (ref 3.5–5.1)
SODIUM: 139 mmol/L (ref 135–145)
Total Bilirubin: 0.1 mg/dL — ABNORMAL LOW (ref 0.3–1.2)
Total Protein: 7.7 g/dL (ref 6.5–8.1)

## 2017-08-19 LAB — HEMOGLOBIN A1C
HEMOGLOBIN A1C: 6.2 % — AB (ref 4.8–5.6)
MEAN PLASMA GLUCOSE: 131.24 mg/dL

## 2017-08-19 NOTE — Progress Notes (Signed)
CMP ROUTED VIA Epic TO DR Fanny Skates

## 2017-08-19 NOTE — Progress Notes (Signed)
RN SPOKE WITH DR Montez Hageman OF ANESTHESIA TO CONSULT FOR PATIENT C/O TONGUE AND PERIORAL NUMBNESS FOR WHICH HE SAW AN OTOLARYNGOLOGIST FOR. PER PATIENT , HE WAS SENT FOR MRI OF BRAIN (SEE RESULT IN Epic DATED 08-15-17) BUT HAS NOT RECEIVED ANY FEEDBACK YET FROM REFERRING PROVIDER. RN ALSO MADE DR Melburn Hake OF PATIENT C/O OF EXCESS BUILDUP OF PHLEGM IN THROAT AND CONSISTENT , SOMETIMES PRODUCTIVE COUGH (HE DRINKS NEARLY PLENTY OF WATER TO LOOSEN PHLEGM). PATIENT EXPRESSED CONCERN ABOUT ET TUBE INSERTION DUE TO THIS . PER DR CARIGNAN, AFOREMENTIONED ISSUES ARE NOT PROBLEMATIC AT THIS TIME. PATIENT MAY PROCEED, NO FURTHER RECOMMENDATIONS.

## 2017-08-21 NOTE — H&P (Signed)
Adam Yang Location: [facility] Patient #: [id number] DOB: [birth year] Married / Language: English / Race: White Male       History of Present Illness       This is a pleasant 72 year old gentleman, referred by Dr. Mardene Sayer for evaluation of symptomatic right inguinal hernia. His wife is with him throughout the encounter.     He's had a bulge and a little bit of discomfort for several years in the right groin. More recently the hernia bulge has been getting hard and he has to push it back in. No nausea or vomiting. 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 Treated by Dr. Redmond Baseman and Dr. Simeon Craft such. He had radiation therapy. Former smoker. Adult-onset diabetes mellitus. Hypertension. Hyperlipidemia. Initially had 40 pound weight loss but these been stable. 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 Father died of lung cancer. Social history reveals he is married with one child. Retired [employer] examiner. Wife is present. Denies alcohol      He has a large hernia. Had to forcibly reduce this today He wants to have this repaired electively. We talked about open repair and laparoscopic repair He'll be scheduled for open repair of right inguinal hernia with mesh since the hernia is so large. I discussed the indications, details, techniques, and numerous risk of the surgery with him and his wife. He is aware of the risk of bleeding, infection, recurrence, nerve damage with chronic pain, injury to the testicle were or other adjacent organs, anesthesia complications, and other unforeseen problems. He understands these issues well. All his questions were answered. He agree with this plan.     Physical Exam  General Mental Status-Alert. General Appearance-Consistent with stated age. Hydration-Well  hydrated. Voice-Normal.  Head and Neck Head-normocephalic, atraumatic with no lesions or palpable masses. Trachea-midline. Thyroid Gland Characteristics - normal size and consistency.  Eye Eyeball - Bilateral-Extraocular movements intact. Sclera/Conjunctiva - Bilateral-No scleral icterus.  Chest and Lung Exam Chest and lung exam reveals -quiet, even and easy respiratory effort with no use of accessory muscles and on auscultation, normal breath sounds, no adventitious sounds and normal vocal resonance. Inspection Chest Wall - Normal. Back - normal.  Cardiovascular Cardiovascular examination reveals -normal heart sounds, regular rate and rhythm with no murmurs and normal pedal pulses bilaterally.  Abdomen Inspection Inspection of the abdomen reveals - No Hernias. Skin - Scar - no surgical scars. Palpation/Percussion Palpation and Percussion of the abdomen reveal - Soft, Non Tender, No Rebound tenderness, No Rigidity (guarding) and No hepatosplenomegaly. Auscultation Auscultation of the abdomen reveals - Bowel sounds normal.  Male Genitourinary Note: Large right inguinal hernia. Small grapefruit or large orange. Supine I was able to forcibly popped his back in but it hurt him a little bit. No evidence of hernia on the left side. No femoral mass.   Neurologic Neurologic evaluation reveals -alert and oriented x 3 with no impairment of recent or remote memory. Mental Status-Normal.  Musculoskeletal Normal Exam - Left-Upper Extremity Strength Normal and Lower Extremity Strength Normal. Normal Exam - Right-Upper Extremity Strength Normal and Lower Extremity Strength Normal.  Lymphatic Head & Neck  General Head & Neck Lymphatics: Bilateral - Description - Normal. Axillary  General Axillary Region: Bilateral - Description - Normal. Tenderness - Non Tender. Femoral & Inguinal  Generalized Femoral & Inguinal  Lymphatics: Bilateral - Description - Normal.  Tenderness - Non Tender.    Assessment & Plan  RIGHT INGUINAL HERNIA (K40.90)    You have a large right inguinal hernia. 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 Dalbert Batman, M.D., [facility] General and Minimally invasive Surgery Breast and Colorectal Surgery Office:   [phone] Pager:   [phone]

## 2017-08-23 ENCOUNTER — Encounter (HOSPITAL_COMMUNITY): Payer: Self-pay | Admitting: Anesthesiology

## 2017-08-23 ENCOUNTER — Ambulatory Visit (HOSPITAL_COMMUNITY): Payer: Medicare Other | Admitting: Anesthesiology

## 2017-08-23 ENCOUNTER — Ambulatory Visit (HOSPITAL_COMMUNITY)
Admission: RE | Admit: 2017-08-23 | Discharge: 2017-08-23 | Disposition: A | Discharge status: Home / Self Care | Payer: Medicare Other | Source: Ambulatory Visit | Attending: General Surgery | Admitting: General Surgery

## 2017-08-23 ENCOUNTER — Encounter (HOSPITAL_COMMUNITY)
Admission: RE | Disposition: A | Discharge status: Home / Self Care | Payer: Self-pay | Source: Ambulatory Visit | Attending: General Surgery

## 2017-08-23 DIAGNOSIS — I10 Essential (primary) hypertension: Secondary | ICD-10-CM | POA: Insufficient documentation

## 2017-08-23 DIAGNOSIS — Z85818 Personal history of malignant neoplasm of other sites of lip, oral cavity, and pharynx: Secondary | ICD-10-CM | POA: Insufficient documentation

## 2017-08-23 DIAGNOSIS — K4090 Unilateral inguinal hernia, without obstruction or gangrene, not specified as recurrent: Secondary | ICD-10-CM | POA: Diagnosis not present

## 2017-08-23 DIAGNOSIS — Z87891 Personal history of nicotine dependence: Secondary | ICD-10-CM | POA: Diagnosis not present

## 2017-08-23 DIAGNOSIS — E119 Type 2 diabetes mellitus without complications: Secondary | ICD-10-CM | POA: Insufficient documentation

## 2017-08-23 DIAGNOSIS — E1165 Type 2 diabetes mellitus with hyperglycemia: Secondary | ICD-10-CM | POA: Diagnosis not present

## 2017-08-23 DIAGNOSIS — G8918 Other acute postprocedural pain: Secondary | ICD-10-CM | POA: Diagnosis not present

## 2017-08-23 HISTORY — DX: Unilateral inguinal hernia, without obstruction or gangrene, not specified as recurrent: K40.90

## 2017-08-23 HISTORY — PX: INSERTION OF MESH: SHX5868

## 2017-08-23 HISTORY — PX: INGUINAL HERNIA REPAIR: SHX194

## 2017-08-23 LAB — GLUCOSE, CAPILLARY
Glucose-Capillary: 220 mg/dL — ABNORMAL HIGH (ref 65–99)
Glucose-Capillary: 127 mg/dL — ABNORMAL HIGH (ref 65–99)

## 2017-08-23 SURGERY — REPAIR, HERNIA, INGUINAL, ADULT
Anesthesia: General | Laterality: Right

## 2017-08-23 MED ORDER — MIDAZOLAM HCL 2 MG/2ML IJ SOLN
INTRAMUSCULAR | Status: AC
  Filled 2017-08-23: qty 2

## 2017-08-23 MED ORDER — ROCURONIUM BROMIDE 10 MG/ML (PF) SYRINGE
PREFILLED_SYRINGE | INTRAVENOUS | Status: DC | PRN
  Administered 2017-08-23: 50 mg via INTRAVENOUS

## 2017-08-23 MED ORDER — ONDANSETRON HCL 4 MG/2ML IJ SOLN
INTRAMUSCULAR | Status: DC | PRN
  Administered 2017-08-23: 4 mg via INTRAVENOUS

## 2017-08-23 MED ORDER — PROPOFOL 10 MG/ML IV BOLUS
INTRAVENOUS | Status: AC
Start: 2017-08-23 — End: ?
  Filled 2017-08-23: qty 20

## 2017-08-23 MED ORDER — ROCURONIUM BROMIDE 10 MG/ML (PF) SYRINGE
PREFILLED_SYRINGE | INTRAVENOUS | Status: AC
  Filled 2017-08-23: qty 5

## 2017-08-23 MED ORDER — PROPOFOL 10 MG/ML IV BOLUS
INTRAVENOUS | Status: DC | PRN
  Administered 2017-08-23: 130 mg via INTRAVENOUS

## 2017-08-23 MED ORDER — CELECOXIB 200 MG PO CAPS
200.0000 mg | ORAL_CAPSULE | ORAL | Status: AC
  Administered 2017-08-23: 200 mg via ORAL
  Filled 2017-08-23: qty 1

## 2017-08-23 MED ORDER — BUPIVACAINE-EPINEPHRINE (PF) 0.5% -1:200000 IJ SOLN
INTRAMUSCULAR | Status: AC
  Filled 2017-08-23: qty 30

## 2017-08-23 MED ORDER — FENTANYL CITRATE (PF) 100 MCG/2ML IJ SOLN
50.0000 ug | INTRAMUSCULAR | Status: DC | PRN
  Administered 2017-08-23: 100 ug via INTRAVENOUS

## 2017-08-23 MED ORDER — SUGAMMADEX SODIUM 200 MG/2ML IV SOLN
INTRAVENOUS | Status: AC
  Filled 2017-08-23: qty 2

## 2017-08-23 MED ORDER — EPHEDRINE 5 MG/ML INJ
INTRAVENOUS | Status: AC
  Filled 2017-08-23: qty 10

## 2017-08-23 MED ORDER — CEFAZOLIN SODIUM-DEXTROSE 2-4 GM/100ML-% IV SOLN
2.0000 g | INTRAVENOUS | Status: AC
  Administered 2017-08-23: 2 g via INTRAVENOUS
  Filled 2017-08-23: qty 100

## 2017-08-23 MED ORDER — METOCLOPRAMIDE HCL 5 MG/ML IJ SOLN: 10.0000 mg | Freq: Once | INTRAMUSCULAR | Status: DC | PRN

## 2017-08-23 MED ORDER — PHENYLEPHRINE 40 MCG/ML (10ML) SYRINGE FOR IV PUSH (FOR BLOOD PRESSURE SUPPORT)
PREFILLED_SYRINGE | INTRAVENOUS | Status: DC | PRN
  Administered 2017-08-23 (×5): 80 ug via INTRAVENOUS

## 2017-08-23 MED ORDER — DEXAMETHASONE SODIUM PHOSPHATE 10 MG/ML IJ SOLN
INTRAMUSCULAR | Status: DC | PRN
  Administered 2017-08-23: 10 mg via INTRAVENOUS

## 2017-08-23 MED ORDER — GABAPENTIN 300 MG PO CAPS
300.0000 mg | ORAL_CAPSULE | ORAL | Status: AC
  Administered 2017-08-23: 300 mg via ORAL
  Filled 2017-08-23: qty 1

## 2017-08-23 MED ORDER — LIDOCAINE 2% (20 MG/ML) 5 ML SYRINGE
INTRAMUSCULAR | Status: AC
  Filled 2017-08-23: qty 15

## 2017-08-23 MED ORDER — LIDOCAINE-EPINEPHRINE 2 %-1:100000 IJ SOLN
INTRAMUSCULAR | Status: AC
  Filled 2017-08-23: qty 1

## 2017-08-23 MED ORDER — EPHEDRINE SULFATE-NACL 50-0.9 MG/10ML-% IV SOSY
PREFILLED_SYRINGE | INTRAVENOUS | Status: DC | PRN
  Administered 2017-08-23 (×2): 5 mg via INTRAVENOUS
  Administered 2017-08-23 (×4): 10 mg via INTRAVENOUS

## 2017-08-23 MED ORDER — PHENYLEPHRINE 40 MCG/ML (10ML) SYRINGE FOR IV PUSH (FOR BLOOD PRESSURE SUPPORT)
PREFILLED_SYRINGE | INTRAVENOUS | Status: AC
  Filled 2017-08-23: qty 10

## 2017-08-23 MED ORDER — MIDAZOLAM HCL 2 MG/2ML IJ SOLN
1.0000 mg | INTRAMUSCULAR | Status: DC | PRN
  Administered 2017-08-23: 2 mg via INTRAVENOUS

## 2017-08-23 MED ORDER — ACETAMINOPHEN 500 MG PO TABS
1000.0000 mg | ORAL_TABLET | ORAL | Status: AC
  Administered 2017-08-23: 1000 mg via ORAL
  Filled 2017-08-23: qty 2

## 2017-08-23 MED ORDER — LIDOCAINE 2% (20 MG/ML) 5 ML SYRINGE
INTRAMUSCULAR | Status: DC | PRN
  Administered 2017-08-23: 40 mg via INTRAVENOUS

## 2017-08-23 MED ORDER — BUPIVACAINE-EPINEPHRINE 0.5% -1:200000 IJ SOLN
INTRAMUSCULAR | Status: DC | PRN
  Administered 2017-08-23: 13 mL

## 2017-08-23 MED ORDER — 0.9 % SODIUM CHLORIDE (POUR BTL) OPTIME
TOPICAL | Status: DC | PRN
  Administered 2017-08-23: 1000 mL

## 2017-08-23 MED ORDER — ONDANSETRON HCL 4 MG/2ML IJ SOLN
INTRAMUSCULAR | Status: AC
  Filled 2017-08-23: qty 6

## 2017-08-23 MED ORDER — CHLORHEXIDINE GLUCONATE CLOTH 2 % EX PADS: 6.0000 | MEDICATED_PAD | Freq: Once | CUTANEOUS | Status: DC

## 2017-08-23 MED ORDER — SUGAMMADEX SODIUM 200 MG/2ML IV SOLN
INTRAVENOUS | Status: DC | PRN
  Administered 2017-08-23: 263.2 mg via INTRAVENOUS

## 2017-08-23 MED ORDER — PROPOFOL 10 MG/ML IV BOLUS
INTRAVENOUS | Status: AC
  Filled 2017-08-23: qty 40

## 2017-08-23 MED ORDER — FENTANYL CITRATE (PF) 100 MCG/2ML IJ SOLN: 25.0000 ug | INTRAMUSCULAR | Status: DC | PRN

## 2017-08-23 MED ORDER — LACTATED RINGERS IV SOLN
INTRAVENOUS | Status: DC | PRN
  Administered 2017-08-23: 1000 mL via INTRAVENOUS
  Administered 2017-08-23: 06:00:00 via INTRAVENOUS

## 2017-08-23 MED ORDER — FENTANYL CITRATE (PF) 100 MCG/2ML IJ SOLN
INTRAMUSCULAR | Status: AC
  Filled 2017-08-23: qty 2

## 2017-08-23 MED ORDER — ROPIVACAINE HCL 5 MG/ML IJ SOLN
INTRAMUSCULAR | Status: DC | PRN
  Administered 2017-08-23: 30 mL via PERINEURAL

## 2017-08-23 MED ORDER — MEPERIDINE HCL 50 MG/ML IJ SOLN: 6.2500 mg | INTRAMUSCULAR | Status: DC | PRN

## 2017-08-23 MED ORDER — DEXAMETHASONE SODIUM PHOSPHATE 10 MG/ML IJ SOLN
INTRAMUSCULAR | Status: AC
  Filled 2017-08-23: qty 3

## 2017-08-23 MED ORDER — HYDROCODONE-ACETAMINOPHEN 5-325 MG PO TABS: 1.0000 | ORAL_TABLET | Freq: Four times a day (QID) | ORAL | 0 refills | Status: DC | PRN

## 2017-08-23 SURGICAL SUPPLY — 43 items
BENZOIN TINCTURE PRP APPL 2/3 (GAUZE/BANDAGES/DRESSINGS) IMPLANT
BLADE HEX COATED 2.75 (ELECTRODE) ×2 IMPLANT
BLADE SURG 15 STRL LF DISP TIS (BLADE) ×1 IMPLANT
BLADE SURG 15 STRL SS (BLADE) ×1
CHLORAPREP W/TINT 26ML (MISCELLANEOUS) ×2 IMPLANT
COVER SURGICAL LIGHT HANDLE (MISCELLANEOUS) ×2 IMPLANT
DECANTER SPIKE VIAL GLASS SM (MISCELLANEOUS) ×2 IMPLANT
DERMABOND ADVANCED (GAUZE/BANDAGES/DRESSINGS) ×1
DERMABOND ADVANCED .7 DNX12 (GAUZE/BANDAGES/DRESSINGS) ×1 IMPLANT
DISSECTOR ROUND CHERRY 3/8 STR (MISCELLANEOUS) IMPLANT
DRAIN PENROSE 18X1/2 LTX STRL (DRAIN) IMPLANT
DRAPE LAPAROTOMY TRNSV 102X78 (DRAPE) ×2 IMPLANT
ELECT PENCIL ROCKER SW 15FT (MISCELLANEOUS) ×2 IMPLANT
ELECT REM PT RETURN 15FT ADLT (MISCELLANEOUS) ×2 IMPLANT
GAUZE SPONGE 4X4 12PLY STRL (GAUZE/BANDAGES/DRESSINGS) IMPLANT
GLOVE EUDERMIC 7 POWDERFREE (GLOVE) ×2 IMPLANT
GOWN STRL REUS W/TWL XL LVL3 (GOWN DISPOSABLE) ×4 IMPLANT
KIT BASIN OR (CUSTOM PROCEDURE TRAY) ×2 IMPLANT
MESH ULTRAPRO 3X6 7.6X15CM (Mesh General) ×2 IMPLANT
NEEDLE HYPO 25X1 1.5 SAFETY (NEEDLE) ×2 IMPLANT
PACK BASIC VI WITH GOWN DISP (CUSTOM PROCEDURE TRAY) ×2 IMPLANT
SPONGE LAP 4X18 X RAY DECT (DISPOSABLE) ×2 IMPLANT
STAPLER VISISTAT 35W (STAPLE) IMPLANT
STRIP CLOSURE SKIN 1/2X4 (GAUZE/BANDAGES/DRESSINGS) IMPLANT
SUT MNCRL AB 4-0 PS2 18 (SUTURE) ×2 IMPLANT
SUT NOVA NAB GS-21 0 18 T12 DT (SUTURE) IMPLANT
SUT PROLENE 2 0 CT2 30 (SUTURE) ×6 IMPLANT
SUT SILK 2 0 (SUTURE) ×1
SUT SILK 2 0 SH (SUTURE) IMPLANT
SUT SILK 2-0 18XBRD TIE 12 (SUTURE) ×1 IMPLANT
SUT VIC AB 2-0 SH 27 (SUTURE) ×2
SUT VIC AB 2-0 SH 27X BRD (SUTURE) ×2 IMPLANT
SUT VIC AB 3-0 54XBRD REEL (SUTURE) ×1 IMPLANT
SUT VIC AB 3-0 BRD 54 (SUTURE) ×1
SUT VIC AB 3-0 SH 27 (SUTURE) ×1
SUT VIC AB 3-0 SH 27XBRD (SUTURE) ×1 IMPLANT
SUT VICRYL 2 0 18  UND BR (SUTURE)
SUT VICRYL 2 0 18 UND BR (SUTURE) IMPLANT
SYR 20CC LL (SYRINGE) ×2 IMPLANT
SYR BULB IRRIGATION 50ML (SYRINGE) ×2 IMPLANT
TOWEL OR 17X26 10 PK STRL BLUE (TOWEL DISPOSABLE) ×2 IMPLANT
TOWEL OR NON WOVEN STRL DISP B (DISPOSABLE) ×2 IMPLANT
YANKAUER SUCT BULB TIP 10FT TU (MISCELLANEOUS) IMPLANT

## 2017-08-23 NOTE — Anesthesia Preprocedure Evaluation (Signed)
Anesthesia Evaluation  Patient identified by MRN, date of birth, ID band Patient awake    Reviewed: Allergy & Precautions, NPO status , Patient's Chart, lab work & pertinent test results  Airway Mallampati: II  TM Distance: >3 FB Neck ROM: Full    Dental no notable dental hx. (+) Edentulous Upper, Edentulous Lower   Pulmonary former smoker,    Pulmonary exam normal breath sounds clear to auscultation       Cardiovascular hypertension, Pt. on medications Normal cardiovascular exam Rhythm:Regular Rate:Normal     Neuro/Psych negative neurological ROS  negative psych ROS   GI/Hepatic negative GI ROS, Neg liver ROS,   Endo/Other  diabetes  Renal/GU negative Renal ROS  negative genitourinary   Musculoskeletal negative musculoskeletal ROS (+)   Abdominal   Peds negative pediatric ROS (+)  Hematology negative hematology ROS (+)   Anesthesia Other Findings   Reproductive/Obstetrics negative OB ROS                             Anesthesia Physical Anesthesia Plan  ASA: II  Anesthesia Plan: General   Post-op Pain Management: GA combined w/ Regional for post-op pain   Induction: Intravenous  PONV Risk Score and Plan: 3 and Ondansetron, Midazolam and Treatment may vary due to age or medical condition  Airway Management Planned: Oral ETT and LMA  Additional Equipment:   Intra-op Plan:   Post-operative Plan: Extubation in OR  Informed Consent: I have reviewed the patients History and Physical, chart, labs and discussed the procedure including the risks, benefits and alternatives for the proposed anesthesia with the patient or authorized representative who has indicated his/her understanding and acceptance.   Dental advisory given  Plan Discussed with: CRNA  Anesthesia Plan Comments:         Anesthesia Quick Evaluation

## 2017-08-23 NOTE — Anesthesia Procedure Notes (Signed)
Anesthesia Regional Block: TAP block   Pre-Anesthetic Checklist: ,, timeout performed, Correct Patient, Correct Site, Correct Laterality, Correct Procedure, Correct Position, site marked, Risks and benefits discussed,  Surgical consent,  Pre-op evaluation,  At surgeon's request and post-op pain management  Laterality: Right  Prep: Maximum Sterile Barrier Precautions used, chloraprep       Needles:  Injection technique: Single-shot  Needle Type: Echogenic Stimulator Needle     Needle Length: 10cm      Additional Needles:   Procedures:,,,, ultrasound used (permanent image in chart),,,,  Narrative:  Start time: 08/23/2017 7:00 AM End time: 08/23/2017 7:10 AM Injection made incrementally with aspirations every 5 mL.  Performed by: Personally  Anesthesiologist: Montez Hageman, MD  Additional Notes: Risks, benefits and alternative to block explained extensively.  Patient tolerated procedure well, without complications.

## 2017-08-23 NOTE — Interval H&P Note (Signed)
History and Physical Interval Note:  08/23/2017 6:19 AM  Adam Yang  has presented today for surgery, with the diagnosis of RIGHT INGUINAL HERNIA  The various methods of treatment have been discussed with the patient and family. After consideration of risks, benefits and other options for treatment, the patient has consented to  Procedure(s) with comments: OPEN REPAIR RIGHT INGUINAL HERNIA WITH MESH (Right) - GENERAL AND TAP BLOCK INSERTION OF MESH (Right) - GENERAL AND TAP BLOCK as a surgical intervention .  The patient's history has been reviewed, patient examined, no change in status, stable for surgery.  I have reviewed the patient's chart and labs.  Questions were answered to the patient's satisfaction.     Adin Hector

## 2017-08-23 NOTE — Transfer of Care (Signed)
Immediate Anesthesia Transfer of Care Note  Patient: Adam Yang  Procedure(s) Performed: OPEN REPAIR RIGHT INGUINAL HERNIA WITH MESH (Right ) INSERTION OF MESH (Right )  Patient Location: PACU  Anesthesia Type:General  Level of Consciousness: awake, alert  and oriented  Airway & Oxygen Therapy: Patient Spontanous Breathing and Patient connected to face mask oxygen  Post-op Assessment: Report given to RN  Post vital signs: Reviewed and stable  Last Vitals:  Vitals:   08/23/17 0615 08/23/17 0620  BP:  (!) 179/82  Pulse: 99   Resp: 18   Temp: 36.8 C   SpO2: 98%     Last Pain:  Vitals:   08/23/17 0615  TempSrc: Oral      Patients Stated Pain Goal: 4 (63/84/66 5993)  Complications: No apparent anesthesia complications

## 2017-08-23 NOTE — Op Note (Signed)
Patient Name:           Adam Yang   Date of Surgery:        08/23/2017  Pre op Diagnosis:      Right inguinal hernia  Post op Diagnosis:    Direct right inguinal hernia  Procedure:                 Open repair right inguinal hernia with mesh  Karl Pock repair)  Surgeon:                     Edsel Petrin. Dalbert Batman, M.D., FACS  Assistant:                      Or staff  Operative Indications:        This is a pleasant 72 year old gentleman, referred by Dr. Mardene Sayer for evaluation of symptomatic right inguinal hernia.     He's had a bulge and a little bit of discomfort for several years in the right groin. More recently the hernia bulge has been getting hard and he has to push it back in. No nausea or vomiting. No prior history of hernia repai      He has a large hernia. Had to forcibly reduce this in the office. He wants to have this repaired electively. We talked about open repair and laparoscopic repair He'll be scheduled for open repair of right inguinal hernia with mesh since the hernia is so large. He agrees with this plan.   Operative Findings:       He had a direct right inguinal hernia the size of chicken egg.  He did not have an indirect hernia.  I could see the edge of the peritoneum at the level of the internal ring associated with the cord structures.  Procedure in Detail:          Following the induction of general endotracheal anesthesia the patient's abdomen and genitalia were prepped and draped in a sterile fashion.  Surgical timeout was performed.  Intravenous antibiotics were given.  A TAPP  block had been performed by anesthesia preop.  0.5% Marcaine with epinephrine was used as a local infiltration anesthetic.      Transverse incision was made in the right groin.  Dissection was carried down through subcutaneous tissue exposing the external oblique.  The external oblique was incised in the direction of its fibers, opening up the external inguinal ring.   Self-retaining retractors were placed.  The cord structures were mobilized and encircled with a Penrose drain drain.  The ilioinguinal nerve was traced back to its origin from the muscles laterally, clamped, divided and ligated with 2-0 silk tie.  The redundant nerve was excised.     Cremasteric muscle fibers were skeletonized.  A large, direct hernia bulge was noted medial to the cord structures.  This was reduced and oversewn with a running suture of 2-0 Vicryl.  The cord structures were inspected for indirect sac.  There was no indirect sac.  The floor of the inguinal canal was repaired and reinforced with an onlay graft of ultra Pro mesh.  A 3 x 6" piece of mesh was brought operative field and trimmed the corners to accommodate the anatomy of the wound.  The mesh was sutured in place with running sutures and mattress sutures of 2-0 Prolene.  The mesh was sutured so as to generously overlap the fascia at the pubic tubercle, then along the inguinal ligament inferiorly  several mattress sutures were placed medially, superiorly, and superiolaterally.  The mesh was incised laterally so as to wraparound the cord structures at the internal ring.  The tails of the mesh were overlapped laterally.  Further sutures were placed laterally.  This provided very secure coverage repair both medial and lateral to the internal ring but allowed an adequate fingertip opening for the cord structures.  Hemostasis was excellent.  The wound was irrigated.     The external oblique was closed with a running suture of 2-0 Vicryl placing the cord structures deep to the external oblique.  Scarpa's fascia was closed with 3-0 Vicryl and the skin closed with a running subcuticular 4-0 Monocryl and Dermabond.  The patient tolerated the procedure well was taken to PACU in stable condition.  EBL 10 mL.  Counts correct.  Complications none.    Addendum: I logged onto the [employer] and reviewed his prescription medication  history     Graycee Greeson M. Dalbert Batman, M.D., FACS General and Minimally Invasive Surgery Breast and Colorectal Surgery  08/23/2017 8:34 AM

## 2017-08-23 NOTE — Discharge Instructions (Signed)
[facility] _______[facility] ° °INGUINAL HERNIA REPAIR: POST OP INSTRUCTIONS ° °Always review your discharge instruction sheet given to you by the facility where your surgery was performed. °IF YOU HAVE DISABILITY OR FAMILY LEAVE FORMS, YOU MUST BRING THEM TO THE OFFICE FOR PROCESSING.   °DO NOT GIVE THEM TO YOUR DOCTOR. ° °1. A  prescription for pain medication may be given to you upon discharge.  Take your pain medication as prescribed, if needed.  If narcotic pain medicine is not needed, then you may take acetaminophen (Tylenol) or ibuprofen (Advil) as needed. °2. Take your usually prescribed medications unless otherwise directed. °If you need a refill on your pain medication, please contact your pharmacy.  They will contact our office to request authorization. Prescriptions will not be filled after 5 pm or on week-ends. °3. You should follow a light diet the first 24 hours after arrival home, such as soup and crackers, etc.  Be sure to include lots of fluids daily.  Resume your normal diet the day after surgery. °4.Most patients will experience some swelling and bruising  in the groin and scrotum.  Ice packs and reclining will help.  Swelling and bruising can take several days to resolve.  °6. It is common to experience some constipation if taking pain medication after surgery.  Increasing fluid intake and taking a stool softener (such as Colace) will usually help or prevent this problem from occurring.  A mild laxative (Milk of Magnesia or Miralax) should be taken according to package directions if there are no bowel movements after 48 hours. °7. Unless discharge instructions indicate otherwise, you may remove your bandages 24-48 hours after surgery, and you may shower at that time.  You may have steri-strips (small skin tapes) in place directly over the incision.  These strips should be left on the skin for 7-10 days.  If your surgeon used skin glue on the incision, you may shower in 24 hours.  The  glue will flake off over the next 2-3 weeks.  Any sutures or staples will be removed at the office during your follow-up visit. °8. ACTIVITIES:  You may resume regular (light) daily activities beginning the next day--such as daily self-care, walking, climbing stairs--gradually increasing activities as tolerated.  You may have sexual intercourse when it is comfortable.  Refrain from any heavy lifting or straining until approved by your doctor. ° °a.You may drive when you are no longer taking prescription pain medication, you can comfortably wear a seatbelt, and you can safely maneuver your car and apply brakes. °b.RETURN TO WORK:   °_____________________________________________ ° °9.You should see your doctor in the office for a follow-up appointment approximately 2-3 weeks after your surgery.  Make sure that you call for this appointment within a day or two after you arrive home to insure a convenient appointment time. °10.OTHER INSTRUCTIONS: _________________________ °   _____________________________________ ° °WHEN TO CALL YOUR DOCTOR: °1. Fever over 101.0 °2. Inability to urinate °3. Nausea and/or vomiting °4. Extreme swelling or bruising °5. Continued bleeding from incision. °6. Increased pain, redness, or drainage from the incision ° °The clinic staff is available to answer your questions during regular business hours.  Please don’t hesitate to call and ask to speak to one of the nurses for clinical concerns.  If you have a medical emergency, go to the nearest emergency room or call 911.  A surgeon from [facility] is always on call at the hospital ° ° °[address], [address], [place], [place]  [address] ?  [address], [place], [place]   [address] [phone] ? [phone] ? FAX [phone] Web site: [email or web address]

## 2017-08-23 NOTE — Anesthesia Postprocedure Evaluation (Signed)
Anesthesia Post Note  Patient: Adam Yang  Procedure(s) Performed: OPEN REPAIR RIGHT INGUINAL HERNIA WITH MESH (Right ) INSERTION OF MESH (Right )     Patient location during evaluation: PACU Anesthesia Type: General Level of consciousness: awake and alert Pain management: pain level controlled Vital Signs Assessment: post-procedure vital signs reviewed and stable Respiratory status: spontaneous breathing, nonlabored ventilation, respiratory function stable and patient connected to nasal cannula oxygen Cardiovascular status: blood pressure returned to baseline and stable Postop Assessment: no apparent nausea or vomiting Anesthetic complications: no    Last Vitals:  Vitals:   08/23/17 0924 08/23/17 1115  BP: (!) 153/80 140/76  Pulse: 92 84  Resp: 13 13  Temp: (!) 36.4 C (!) 36.4 C  SpO2: 96% 97%    Last Pain:  Vitals:   08/23/17 0924  TempSrc:   PainSc: 2                  Montez Hageman

## 2017-08-23 NOTE — Anesthesia Procedure Notes (Signed)
Procedure Name: Intubation Date/Time: 08/23/2017 7:20 AM Performed by: Lavina Hamman, CRNA Pre-anesthesia Checklist: Patient identified, Emergency Drugs available, Suction available, Patient being monitored and Timeout performed Patient Re-evaluated:Patient Re-evaluated prior to induction Oxygen Delivery Method: Circle system utilized Preoxygenation: Pre-oxygenation with 100% oxygen Induction Type: IV induction Ventilation: Mask ventilation without difficulty Laryngoscope Size: Mac and 4 Grade View: Grade I Tube type: Oral Tube size: 7.5 mm Number of attempts: 1 Airway Equipment and Method: Stylet Placement Confirmation: ETT inserted through vocal cords under direct vision,  positive ETCO2,  CO2 detector and breath sounds checked- equal and bilateral Secured at: 21 cm Tube secured with: Tape Dental Injury: Teeth and Oropharynx as per pre-operative assessment

## 2017-09-29 DIAGNOSIS — R5381 Other malaise: Secondary | ICD-10-CM | POA: Diagnosis not present

## 2017-09-29 DIAGNOSIS — Z0000 Encounter for general adult medical examination without abnormal findings: Secondary | ICD-10-CM | POA: Diagnosis not present

## 2017-09-29 DIAGNOSIS — I10 Essential (primary) hypertension: Secondary | ICD-10-CM | POA: Diagnosis not present

## 2017-09-29 DIAGNOSIS — E782 Mixed hyperlipidemia: Secondary | ICD-10-CM | POA: Diagnosis not present

## 2017-10-17 DIAGNOSIS — K148 Other diseases of tongue: Secondary | ICD-10-CM | POA: Diagnosis not present

## 2017-10-17 DIAGNOSIS — Z85818 Personal history of malignant neoplasm of other sites of lip, oral cavity, and pharynx: Secondary | ICD-10-CM | POA: Diagnosis not present

## 2017-10-17 DIAGNOSIS — R200 Anesthesia of skin: Secondary | ICD-10-CM | POA: Diagnosis not present

## 2017-10-17 DIAGNOSIS — J342 Deviated nasal septum: Secondary | ICD-10-CM | POA: Diagnosis not present

## 2017-10-18 DIAGNOSIS — N289 Disorder of kidney and ureter, unspecified: Secondary | ICD-10-CM | POA: Diagnosis not present

## 2017-11-02 DIAGNOSIS — I129 Hypertensive chronic kidney disease with stage 1 through stage 4 chronic kidney disease, or unspecified chronic kidney disease: Secondary | ICD-10-CM | POA: Diagnosis not present

## 2017-11-02 DIAGNOSIS — N183 Chronic kidney disease, stage 3 (moderate): Secondary | ICD-10-CM | POA: Diagnosis not present

## 2017-11-02 DIAGNOSIS — N179 Acute kidney failure, unspecified: Secondary | ICD-10-CM | POA: Diagnosis not present

## 2017-11-02 DIAGNOSIS — E1122 Type 2 diabetes mellitus with diabetic chronic kidney disease: Secondary | ICD-10-CM | POA: Diagnosis not present

## 2017-11-04 DIAGNOSIS — N183 Chronic kidney disease, stage 3 (moderate): Secondary | ICD-10-CM | POA: Diagnosis not present

## 2017-11-04 DIAGNOSIS — N281 Cyst of kidney, acquired: Secondary | ICD-10-CM | POA: Diagnosis not present

## 2017-12-19 DIAGNOSIS — H40013 Open angle with borderline findings, low risk, bilateral: Secondary | ICD-10-CM | POA: Diagnosis not present

## 2018-01-02 DIAGNOSIS — N183 Chronic kidney disease, stage 3 (moderate): Secondary | ICD-10-CM | POA: Diagnosis not present

## 2018-01-04 DIAGNOSIS — E1122 Type 2 diabetes mellitus with diabetic chronic kidney disease: Secondary | ICD-10-CM | POA: Diagnosis not present

## 2018-01-04 DIAGNOSIS — R801 Persistent proteinuria, unspecified: Secondary | ICD-10-CM | POA: Diagnosis not present

## 2018-01-04 DIAGNOSIS — I129 Hypertensive chronic kidney disease with stage 1 through stage 4 chronic kidney disease, or unspecified chronic kidney disease: Secondary | ICD-10-CM | POA: Diagnosis not present

## 2018-01-04 DIAGNOSIS — N179 Acute kidney failure, unspecified: Secondary | ICD-10-CM | POA: Diagnosis not present

## 2018-01-26 ENCOUNTER — Emergency Department (HOSPITAL_COMMUNITY): Payer: Medicare Other

## 2018-01-26 ENCOUNTER — Other Ambulatory Visit: Payer: Self-pay

## 2018-01-26 ENCOUNTER — Inpatient Hospital Stay (HOSPITAL_COMMUNITY): Payer: Medicare Other

## 2018-01-26 ENCOUNTER — Inpatient Hospital Stay (HOSPITAL_COMMUNITY)
Admission: EM | Admit: 2018-01-26 | Discharge: 2018-02-02 | DRG: 064 | Disposition: A | Discharge status: Rehabilitation Facility | Payer: Medicare Other | Attending: Neurology | Admitting: Neurology

## 2018-01-26 ENCOUNTER — Encounter (HOSPITAL_COMMUNITY): Payer: Self-pay | Admitting: Emergency Medicine

## 2018-01-26 DIAGNOSIS — D631 Anemia in chronic kidney disease: Secondary | ICD-10-CM | POA: Diagnosis present

## 2018-01-26 DIAGNOSIS — N183 Chronic kidney disease, stage 3 unspecified: Secondary | ICD-10-CM

## 2018-01-26 DIAGNOSIS — I629 Nontraumatic intracranial hemorrhage, unspecified: Secondary | ICD-10-CM | POA: Diagnosis not present

## 2018-01-26 DIAGNOSIS — I69320 Aphasia following cerebral infarction: Secondary | ICD-10-CM | POA: Diagnosis not present

## 2018-01-26 DIAGNOSIS — Z923 Personal history of irradiation: Secondary | ICD-10-CM | POA: Diagnosis not present

## 2018-01-26 DIAGNOSIS — I615 Nontraumatic intracerebral hemorrhage, intraventricular: Secondary | ICD-10-CM

## 2018-01-26 DIAGNOSIS — K4090 Unilateral inguinal hernia, without obstruction or gangrene, not specified as recurrent: Secondary | ICD-10-CM | POA: Diagnosis present

## 2018-01-26 DIAGNOSIS — I69392 Facial weakness following cerebral infarction: Secondary | ICD-10-CM | POA: Diagnosis not present

## 2018-01-26 DIAGNOSIS — D72829 Elevated white blood cell count, unspecified: Secondary | ICD-10-CM | POA: Diagnosis present

## 2018-01-26 DIAGNOSIS — M109 Gout, unspecified: Secondary | ICD-10-CM | POA: Diagnosis present

## 2018-01-26 DIAGNOSIS — I10 Essential (primary) hypertension: Secondary | ICD-10-CM | POA: Diagnosis not present

## 2018-01-26 DIAGNOSIS — Z85819 Personal history of malignant neoplasm of unspecified site of lip, oral cavity, and pharynx: Secondary | ICD-10-CM | POA: Diagnosis not present

## 2018-01-26 DIAGNOSIS — E440 Moderate protein-calorie malnutrition: Secondary | ICD-10-CM | POA: Diagnosis present

## 2018-01-26 DIAGNOSIS — H04123 Dry eye syndrome of bilateral lacrimal glands: Secondary | ICD-10-CM

## 2018-01-26 DIAGNOSIS — F419 Anxiety disorder, unspecified: Secondary | ICD-10-CM | POA: Diagnosis present

## 2018-01-26 DIAGNOSIS — R1314 Dysphagia, pharyngoesophageal phase: Secondary | ICD-10-CM | POA: Diagnosis present

## 2018-01-26 DIAGNOSIS — E870 Hyperosmolality and hypernatremia: Secondary | ICD-10-CM | POA: Diagnosis not present

## 2018-01-26 DIAGNOSIS — R001 Bradycardia, unspecified: Secondary | ICD-10-CM | POA: Diagnosis present

## 2018-01-26 DIAGNOSIS — R0902 Hypoxemia: Secondary | ICD-10-CM | POA: Diagnosis not present

## 2018-01-26 DIAGNOSIS — I619 Nontraumatic intracerebral hemorrhage, unspecified: Secondary | ICD-10-CM | POA: Diagnosis not present

## 2018-01-26 DIAGNOSIS — M1990 Unspecified osteoarthritis, unspecified site: Secondary | ICD-10-CM | POA: Diagnosis not present

## 2018-01-26 DIAGNOSIS — J690 Pneumonitis due to inhalation of food and vomit: Secondary | ICD-10-CM | POA: Diagnosis not present

## 2018-01-26 DIAGNOSIS — Z6820 Body mass index (BMI) 20.0-20.9, adult: Secondary | ICD-10-CM

## 2018-01-26 DIAGNOSIS — N189 Chronic kidney disease, unspecified: Secondary | ICD-10-CM | POA: Diagnosis not present

## 2018-01-26 DIAGNOSIS — N179 Acute kidney failure, unspecified: Secondary | ICD-10-CM | POA: Diagnosis not present

## 2018-01-26 DIAGNOSIS — I610 Nontraumatic intracerebral hemorrhage in hemisphere, subcortical: Secondary | ICD-10-CM | POA: Diagnosis not present

## 2018-01-26 DIAGNOSIS — R29810 Facial weakness: Secondary | ICD-10-CM | POA: Diagnosis present

## 2018-01-26 DIAGNOSIS — Z808 Family history of malignant neoplasm of other organs or systems: Secondary | ICD-10-CM

## 2018-01-26 DIAGNOSIS — R4701 Aphasia: Secondary | ICD-10-CM | POA: Diagnosis present

## 2018-01-26 DIAGNOSIS — I69391 Dysphagia following cerebral infarction: Secondary | ICD-10-CM | POA: Diagnosis not present

## 2018-01-26 DIAGNOSIS — N184 Chronic kidney disease, stage 4 (severe): Secondary | ICD-10-CM | POA: Diagnosis present

## 2018-01-26 DIAGNOSIS — F40240 Claustrophobia: Secondary | ICD-10-CM | POA: Diagnosis present

## 2018-01-26 DIAGNOSIS — I161 Hypertensive emergency: Secondary | ICD-10-CM | POA: Diagnosis not present

## 2018-01-26 DIAGNOSIS — Z66 Do not resuscitate: Secondary | ICD-10-CM | POA: Diagnosis present

## 2018-01-26 DIAGNOSIS — E1122 Type 2 diabetes mellitus with diabetic chronic kidney disease: Secondary | ICD-10-CM | POA: Diagnosis not present

## 2018-01-26 DIAGNOSIS — R4702 Dysphasia: Secondary | ICD-10-CM | POA: Diagnosis not present

## 2018-01-26 DIAGNOSIS — Z801 Family history of malignant neoplasm of trachea, bronchus and lung: Secondary | ICD-10-CM

## 2018-01-26 DIAGNOSIS — Z87891 Personal history of nicotine dependence: Secondary | ICD-10-CM

## 2018-01-26 DIAGNOSIS — I129 Hypertensive chronic kidney disease with stage 1 through stage 4 chronic kidney disease, or unspecified chronic kidney disease: Secondary | ICD-10-CM | POA: Diagnosis not present

## 2018-01-26 DIAGNOSIS — E871 Hypo-osmolality and hyponatremia: Secondary | ICD-10-CM | POA: Diagnosis not present

## 2018-01-26 DIAGNOSIS — Z85818 Personal history of malignant neoplasm of other sites of lip, oral cavity, and pharynx: Secondary | ICD-10-CM | POA: Diagnosis not present

## 2018-01-26 DIAGNOSIS — R414 Neurologic neglect syndrome: Secondary | ICD-10-CM | POA: Diagnosis not present

## 2018-01-26 DIAGNOSIS — R740 Nonspecific elevation of levels of transaminase and lactic acid dehydrogenase [LDH]: Secondary | ICD-10-CM | POA: Diagnosis not present

## 2018-01-26 DIAGNOSIS — G935 Compression of brain: Secondary | ICD-10-CM | POA: Diagnosis not present

## 2018-01-26 DIAGNOSIS — R404 Transient alteration of awareness: Secondary | ICD-10-CM | POA: Diagnosis not present

## 2018-01-26 DIAGNOSIS — I639 Cerebral infarction, unspecified: Secondary | ICD-10-CM | POA: Diagnosis not present

## 2018-01-26 DIAGNOSIS — N289 Disorder of kidney and ureter, unspecified: Secondary | ICD-10-CM | POA: Diagnosis not present

## 2018-01-26 DIAGNOSIS — Z79899 Other long term (current) drug therapy: Secondary | ICD-10-CM

## 2018-01-26 DIAGNOSIS — J9601 Acute respiratory failure with hypoxia: Secondary | ICD-10-CM | POA: Diagnosis not present

## 2018-01-26 DIAGNOSIS — I951 Orthostatic hypotension: Secondary | ICD-10-CM | POA: Diagnosis not present

## 2018-01-26 DIAGNOSIS — E785 Hyperlipidemia, unspecified: Secondary | ICD-10-CM | POA: Diagnosis present

## 2018-01-26 DIAGNOSIS — I69351 Hemiplegia and hemiparesis following cerebral infarction affecting right dominant side: Secondary | ICD-10-CM | POA: Diagnosis not present

## 2018-01-26 DIAGNOSIS — G8101 Flaccid hemiplegia affecting right dominant side: Secondary | ICD-10-CM | POA: Diagnosis present

## 2018-01-26 DIAGNOSIS — G936 Cerebral edema: Secondary | ICD-10-CM | POA: Diagnosis not present

## 2018-01-26 DIAGNOSIS — I611 Nontraumatic intracerebral hemorrhage in hemisphere, cortical: Secondary | ICD-10-CM | POA: Diagnosis not present

## 2018-01-26 DIAGNOSIS — E119 Type 2 diabetes mellitus without complications: Secondary | ICD-10-CM | POA: Diagnosis not present

## 2018-01-26 DIAGNOSIS — Z681 Body mass index (BMI) 19 or less, adult: Secondary | ICD-10-CM | POA: Diagnosis not present

## 2018-01-26 DIAGNOSIS — D638 Anemia in other chronic diseases classified elsewhere: Secondary | ICD-10-CM | POA: Diagnosis not present

## 2018-01-26 DIAGNOSIS — J189 Pneumonia, unspecified organism: Secondary | ICD-10-CM | POA: Diagnosis not present

## 2018-01-26 DIAGNOSIS — I6789 Other cerebrovascular disease: Secondary | ICD-10-CM | POA: Diagnosis not present

## 2018-01-26 LAB — CBC
HCT: 39.6 % (ref 39.0–52.0)
HEMOGLOBIN: 12.4 g/dL — AB (ref 13.0–17.0)
MCH: 25.7 pg — AB (ref 26.0–34.0)
MCHC: 31.3 g/dL (ref 30.0–36.0)
MCV: 82.2 fL (ref 78.0–100.0)
Platelets: 324 10*3/uL (ref 150–400)
RBC: 4.82 MIL/uL (ref 4.22–5.81)
RDW: 14.3 % (ref 11.5–15.5)
WBC: 5.8 10*3/uL (ref 4.0–10.5)

## 2018-01-26 LAB — I-STAT CHEM 8, ED
BUN: 38 mg/dL — ABNORMAL HIGH (ref 8–23)
CALCIUM ION: 1.16 mmol/L (ref 1.15–1.40)
CHLORIDE: 108 mmol/L (ref 98–111)
CREATININE: 2.8 mg/dL — AB (ref 0.61–1.24)
Glucose, Bld: 120 mg/dL — ABNORMAL HIGH (ref 70–99)
HCT: 36 % — ABNORMAL LOW (ref 39.0–52.0)
Hemoglobin: 12.2 g/dL — ABNORMAL LOW (ref 13.0–17.0)
Potassium: 4 mmol/L (ref 3.5–5.1)
Sodium: 140 mmol/L (ref 135–145)
TCO2: 25 mmol/L (ref 22–32)

## 2018-01-26 LAB — DIFFERENTIAL
Abs Immature Granulocytes: 0 10*3/uL (ref 0.0–0.1)
Basophils Absolute: 0 10*3/uL (ref 0.0–0.1)
Basophils Relative: 1 %
EOS PCT: 5 %
Eosinophils Absolute: 0.3 10*3/uL (ref 0.0–0.7)
Immature Granulocytes: 1 %
LYMPHS ABS: 2.1 10*3/uL (ref 0.7–4.0)
Lymphocytes Relative: 36 %
MONO ABS: 0.6 10*3/uL (ref 0.1–1.0)
Monocytes Relative: 11 %
NEUTROS ABS: 2.8 10*3/uL (ref 1.7–7.7)
Neutrophils Relative %: 48 %

## 2018-01-26 LAB — COMPREHENSIVE METABOLIC PANEL
ALK PHOS: 40 U/L (ref 38–126)
ALT: 15 U/L (ref 0–44)
ANION GAP: 10 (ref 5–15)
AST: 21 U/L (ref 15–41)
Albumin: 3.7 g/dL (ref 3.5–5.0)
BILIRUBIN TOTAL: 0.4 mg/dL (ref 0.3–1.2)
BUN: 36 mg/dL — ABNORMAL HIGH (ref 8–23)
CALCIUM: 9.7 mg/dL (ref 8.9–10.3)
CO2: 23 mmol/L (ref 22–32)
CREATININE: 2.5 mg/dL — AB (ref 0.61–1.24)
Chloride: 107 mmol/L (ref 98–111)
GFR calc non Af Amer: 25 mL/min — ABNORMAL LOW (ref >60)
GFR, EST AFRICAN AMERICAN: 28 mL/min — AB (ref >60)
GLUCOSE: 122 mg/dL — AB (ref 70–99)
Potassium: 4 mmol/L (ref 3.5–5.1)
Sodium: 140 mmol/L (ref 135–145)
TOTAL PROTEIN: 7.9 g/dL (ref 6.5–8.1)

## 2018-01-26 LAB — RAPID URINE DRUG SCREEN, HOSP PERFORMED
Amphetamines: NOT DETECTED
Benzodiazepines: NOT DETECTED
Cocaine: NOT DETECTED
Opiates: NOT DETECTED
TETRAHYDROCANNABINOL: NOT DETECTED

## 2018-01-26 LAB — PROTIME-INR
INR: 0.95
Prothrombin Time: 12.5 seconds (ref 11.4–15.2)

## 2018-01-26 LAB — URINALYSIS, ROUTINE W REFLEX MICROSCOPIC
BACTERIA UA: NONE SEEN
BILIRUBIN URINE: NEGATIVE
GLUCOSE, UA: 150 mg/dL — AB
KETONES UR: NEGATIVE mg/dL
LEUKOCYTES UA: NEGATIVE
NITRITE: NEGATIVE
PH: 7 (ref 5.0–8.0)
Protein, ur: 100 mg/dL — AB
SPECIFIC GRAVITY, URINE: 1.018 (ref 1.005–1.030)

## 2018-01-26 LAB — ETHANOL: Alcohol, Ethyl (B): <10 mg/dL (ref <10)

## 2018-01-26 LAB — APTT: aPTT: 32 seconds (ref 24–36)

## 2018-01-26 LAB — I-STAT TROPONIN, ED: Troponin i, poc: 0 ng/mL (ref 0.00–0.08)

## 2018-01-26 LAB — MRSA PCR SCREENING: MRSA BY PCR: NEGATIVE

## 2018-01-26 LAB — CBG MONITORING, ED: Glucose-Capillary: 101 mg/dL — ABNORMAL HIGH (ref 70–99)

## 2018-01-26 MED ORDER — IOPAMIDOL (ISOVUE-370) INJECTION 76%
100.0000 mL | Freq: Once | INTRAVENOUS | Status: AC | PRN
  Administered 2018-01-26: 50 mL via INTRAVENOUS

## 2018-01-26 MED ORDER — CHLORHEXIDINE GLUCONATE 0.12 % MT SOLN
15.0000 mL | Freq: Two times a day (BID) | OROMUCOSAL | Status: DC
  Administered 2018-01-26 – 2018-02-02 (×15): 15 mL via OROMUCOSAL
  Filled 2018-01-26 (×9): qty 15

## 2018-01-26 MED ORDER — LABETALOL HCL 5 MG/ML IV SOLN
20.0000 mg | Freq: Once | INTRAVENOUS | Status: DC
  Filled 2018-01-26: qty 4

## 2018-01-26 MED ORDER — ORAL CARE MOUTH RINSE
15.0000 mL | Freq: Two times a day (BID) | OROMUCOSAL | Status: DC
  Administered 2018-01-26 – 2018-01-28 (×5): 15 mL via OROMUCOSAL

## 2018-01-26 MED ORDER — CLEVIDIPINE BUTYRATE 0.5 MG/ML IV EMUL
INTRAVENOUS | Status: AC
  Filled 2018-01-26: qty 50

## 2018-01-26 MED ORDER — STROKE: EARLY STAGES OF RECOVERY BOOK
Freq: Once | Status: AC
  Administered 2018-01-26: 16:00:00
  Filled 2018-01-26: qty 1

## 2018-01-26 MED ORDER — LABETALOL HCL 5 MG/ML IV SOLN
10.0000 mg | Freq: Once | INTRAVENOUS | Status: AC
  Administered 2018-01-26: 10 mg via INTRAVENOUS

## 2018-01-26 MED ORDER — SENNOSIDES-DOCUSATE SODIUM 8.6-50 MG PO TABS
1.0000 | ORAL_TABLET | Freq: Two times a day (BID) | ORAL | Status: DC
  Administered 2018-01-27 – 2018-02-02 (×12): 1 via ORAL
  Filled 2018-01-26 (×12): qty 1

## 2018-01-26 MED ORDER — CLEVIDIPINE BUTYRATE 0.5 MG/ML IV EMUL
0.0000 mg/h | INTRAVENOUS | Status: DC
  Administered 2018-01-26: 1 mg/h via INTRAVENOUS

## 2018-01-26 MED ORDER — CLEVIDIPINE BUTYRATE 0.5 MG/ML IV EMUL
0.0000 mg/h | INTRAVENOUS | Status: DC
  Administered 2018-01-26: 5 mg/h via INTRAVENOUS
  Administered 2018-01-26: 7 mg/h via INTRAVENOUS
  Administered 2018-01-26: 12 mg/h via INTRAVENOUS
  Administered 2018-01-27: 14 mg/h via INTRAVENOUS
  Administered 2018-01-27: 13 mg/h via INTRAVENOUS
  Administered 2018-01-27: 14 mg/h via INTRAVENOUS
  Administered 2018-01-27: 11 mg/h via INTRAVENOUS
  Administered 2018-01-27: 12 mg/h via INTRAVENOUS
  Administered 2018-01-27: 13 mg/h via INTRAVENOUS
  Administered 2018-01-27 (×2): 12 mg/h via INTRAVENOUS
  Administered 2018-01-28: 8 mg/h via INTRAVENOUS
  Administered 2018-01-28 (×2): 5 mg/h via INTRAVENOUS
  Administered 2018-01-28: 8 mg/h via INTRAVENOUS
  Administered 2018-01-28: 6 mg/h via INTRAVENOUS
  Administered 2018-01-29: 12 mg/h via INTRAVENOUS
  Administered 2018-01-29: 16 mg/h via INTRAVENOUS
  Administered 2018-01-29: 15 mg/h via INTRAVENOUS
  Filled 2018-01-26 (×12): qty 50
  Filled 2018-01-26: qty 100
  Filled 2018-01-26 (×3): qty 50
  Filled 2018-01-26: qty 100

## 2018-01-26 MED ORDER — LORAZEPAM 2 MG/ML IJ SOLN
1.0000 mg | Freq: Once | INTRAMUSCULAR | Status: AC
  Administered 2018-01-26: 1 mg via INTRAVENOUS
  Filled 2018-01-26: qty 1

## 2018-01-26 NOTE — ED Notes (Addendum)
RN discussed with neurologist, Rory Percy, about concerns about patient tolerating MRI due to lymphedema/airway and ICP increase. No changes in orders- pt to MRI.

## 2018-01-26 NOTE — H&P (Addendum)
H&P - ICH/Stroke     HPI:                                                                                                                                         Adam Yang is an 72 y.o. male with past medical history of hypertension, diabetes, hyperlipidemia past radiation for nasopharynx cancer.  Patient was well this morning until approximately 8:15 in the morning when he had told his wife something was wrong.  She noted that he was flaccid on the right side placed in the bed and called EMS.  EMS quickly came and brought him to [facility] as a code stroke.  Upon entering the bridge it was noted that patient could not cross midline to the right and was flaccid on the right.  Patient quickly was brought to CT scan which showed a left frontoparietal bleed.  Patient's blood pressure was quickly addressed with Cleviprex and labetalol.  We attempted to obtain an MRI of the brain but the patient's heart rate kept dropping to the 30s.  MRI was aborted.  Date last known well: Date: 01/26/2018 Time last known well: Time: 08:15 tPA Given: No: Intracranial bleed Modified Rankin: Rankin Score=0  Intracerebral Hemorrhage (ICH) Score Total: 3  Past Medical History:  Diagnosis Date  . Anxiety    Claustrophobia  . Arthritis   . Cancer (HCC)    nasopharynx  . Chronic cough    EXCESS MUCUS BUILDUP SINCE ORAL SURGERY FROM CANCER   . Chronic kidney disease    Renal Insuffiency , Creatinine has been in the 2- 3 range, 11/27/14 Creatine 1.4  . Diabetes mellitus without complication ([place])    Type II  . Gout   . History of radiation therapy 12/23/2014- 02/10/2015   Nasopharynx / Neck Adenopathy  . Hyperlipidemia   . Hypertension   . Inguinal hernia   . Right inguinal hernia 08/23/2017   Past Surgical History:  Procedure Laterality Date  . colonoscopy    . COLONOSCOPY W/ POLYPECTOMY    . INGUINAL HERNIA REPAIR Right 08/23/2017   Procedure: OPEN REPAIR RIGHT INGUINAL HERNIA WITH MESH;   Surgeon: Fanny Skates, MD;  Location: [facility];  Service: General;  Laterality: Right;  GENERAL AND TAP BLOCK  . INSERTION OF MESH Right 08/23/2017   Procedure: INSERTION OF MESH;  Surgeon: Fanny Skates, MD;  Location: [facility];  Service: General;  Laterality: Right;  GENERAL AND TAP BLOCK  . KNEE SURGERY Left 1990's   tibia crushed, pinned and bone grafted  . LYMPH NODE BIOPSY    . MULTIPLE EXTRACTIONS WITH ALVEOLOPLASTY N/A 12/06/2014   Procedure: Extraction of tooth #'s 2,4,5,6,7,8,9,10,11,12,13,14,15, 20,22,26,27,28,29 with alveoloplasty.;  Surgeon: Lenn Cal, DDS;  Location: [place];  Service: Oral Surgery;  Laterality: N/A;   Family History  Problem Relation Age of Onset  . Cancer Mother  throat ca  . Cancer Father        lung ca  . Cancer Brother        throat ca  . Heart block Brother          Social History:  reports that he quit smoking about 19 years ago. He has a 25.00 pack-year smoking history. He has never used smokeless tobacco. He reports that he does not drink alcohol or use drugs.  Allergies: No Known Allergies  Medications:                                                                                                                           Current Facility-Administered Medications  Medication Dose Route Frequency Provider Last Rate Last Dose  .  stroke: mapping our early stages of recovery book   Does not apply Once Marliss Coots, PA-C      . clevidipine (CLEVIPREX) 0.5 MG/ML infusion           . clevidipine (CLEVIPREX) infusion 0.5 mg/mL  0-21 mg/hr Intravenous Continuous Amie Portland, MD      . senna-docusate (Senokot-S) tablet 1 tablet  1 tablet Oral BID Marliss Coots, PA-C       Current Outpatient Medications  Medication Sig Dispense Refill  . amLODipine (NORVASC) 10 MG tablet Take 10 mg by mouth daily.     . fenofibrate 160 MG tablet Take 160 mg by mouth daily.    Marland Kitchen guaiFENesin (MUCINEX) 600 MG 12 hr tablet Take by mouth 2 (two) times  daily as needed.    Marland Kitchen HYDROcodone-acetaminophen (NORCO) 5-325 MG tablet Take 1-2 tablets by mouth every 6 (six) hours as needed for moderate pain or severe pain. 20 tablet 0  . Multiple Vitamins-Minerals (CENTRUM SILVER PO) Take 1 tablet by mouth daily.     . Omega-3 Fatty Acids (FISH OIL PO) Take 1 capsule by mouth daily.      ROS:                                                                                                                                       History obtained from Wife  General ROS: negative for - chills, fatigue, fever, night sweats, weight gain or weight loss Psychological ROS: negative for - , hallucinations, memory difficulties, mood swings or  Ophthalmic ROS: negative for - blurry vision, double vision,  eye pain or loss of vision ENT ROS: Positive for -severe dry mouth and recent nasal discharge Respiratory ROS: negative for - cough,  shortness of breath or wheezing Cardiovascular ROS: negative for - chest pain, dyspnea on exertion,  Gastrointestinal ROS: negative for - abdominal pain, diarrhea,  nausea/vomiting or stool incontinence Genito-Urinary ROS: negative for - dysuria, hematuria, incontinence or urinary frequency/urgency Musculoskeletal ROS: negative for - joint swelling or muscular weakness Neurological ROS: as noted in HPI   General Examination:                                                                                                      Weight 62.5 kg (137 lb 12.6 oz).  HEENT-  Normocephalic, no lesions, without obvious abnormality.  Normal external eye and conjunctiva.   Cardiovascular- S1-S2 audible, pulses palpable throughout   Extremities- Warm, dry and intact Musculoskeletal-no joint tenderness, deformity or swelling Skin-warm and dry, no hyperpigmentation, vitiligo, or suspicious lesions NEURO:  Mental Status: Awake, alert, in no distress Language: mute, not following commands. Cranial Nerves: PERRL. Left gaze preference, can not cross  midline to look to right, visual fields right homonymous hemianopsia, right lower face weakness,  Motor: flaccid RUE and RLE, normal LUE and LLE Tone: is normal and bulk is normal Sensation- severely diminished sensation on right as no grimace to nox stim Coordination: can not assess due to mentation Gait- deferred    Lab Results: Basic Metabolic Panel: Recent Labs  Lab 01/26/18 0919  NA 140  K 4.0  CL 108  GLUCOSE 120*  BUN 38*  CREATININE 2.80*    CBC: Recent Labs  Lab 01/26/18 0913 01/26/18 0919  WBC 5.8  --   NEUTROABS 2.8  --   HGB 12.4* 12.2*  HCT 39.6 36.0*  MCV 82.2  --   PLT 324  --    CBG: Recent Labs  Lab 01/26/18 0913  GLUCAP 101*    Assessment and plan discussed with with attending physician and they are in agreement.    Etta Quill PA-C [facility] [phone]  01/26/2018, 9:26 AM  Attending Neurohospitalist Addendum Patient seen and examined with APP/Resident. Agree with the history and physical as documented above. I have independently reviewed the chart, obtained history, review of systems and examined the patient.I have personally reviewed pertinent head/neck/spine imaging (CT/MRI). CT head: Left cerebral hemispheric hemorrhage >90cc with vasogenic edema CT angio-no vascular malformation, there is no spot sign MRI will be ordered shortly  Assessment: 72 y.o. male 38 most common ED after sudden onset of right facial droop, right arm and leg flaccidity. CT scan shows left frontoparietal bleed. He was hypertensive but also has h/o nasopharyngeal cancer - so differentials include HTN bleed vs Hemorrhagic metastasis. MRI will be done to further evaluate Stroke Risk Factors - diabetes mellitus, hyperlipidemia and hypertension  Recommend SBP goal <140. Cardene/labetalol PRN MRI brain with without stat.  Please feel free to call with any questions. --- Amie Portland, MD [facility] Pager: [phone]  If 7pm to  7am, please call on call as listed on AMION.  Addendum Patient unable to tolerate the MRI. Given that he presented with severe hypertension, we will for now admit him as a hypertensive bleed to the neuro ICU. He will require MRI at some point. I had a detailed conversation about the plan with the family and answered all the questions. We will obtain a repeat CT head in about 6 hours.  If there is any expansion of the hematoma, will consider neurosurgical consultation.  Plan: Subcortical ICH, nontraumatic Cortical ICH, nontraumatic Acuity: Acute Laterality: left Current suspected etiology:  HTN vs Mets  Treatment: -Admit to NICU -ICH Score:3 -ICH Volume:93cc -BP control goal SYS<140 -PT/OT/ST  -neuromonitoring  CNS Cerebral edema -Close neuro monitoring -Repeat head CT at 3:30 PM  -NPO until cleared by speech -ST -Advance diet as tolerated -May need PEG  Hemiplegia and hemiparesis following nontraumatic intracerebral hemorrhage affecting right dominant side  -Continue PT/OT/ST  RESP No acute issues for now. 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 -Monitor -transfuse for hgb < 7  ENDO -goal HgbA1c < 7  Fluid/Electrolyte Disorders -Repeat labs -Trend  ID Possible Aspiration PNA -CXR -NPO -Monitor  Prophylaxis DVT: SCDs only-do not use antiplatelets or anticoagulants GI: Not applicable Bowel: Docusate/senna  Dispo: IP Rehab  Diet: NPO until cleared by speech  Code Status: DNR -wife reports he has a living will and a DNR in place   THE FOLLOWING WERE PRESENT  ON ADMISSION: CNS -  Cerebral Edema,ICH, Hemiplegia Respiratory - probable Aspiration Pneumonia, Non-invasive Mech Ventilation (BiPAP) Cardiovascular -hypertensive emergency Renal -  CKD Heme-  Anemia Cancer - Primary site-nasopharyngeal DNR   -- Amie Portland, MD [facility] Pager: [phone] If 7pm to 7am, please call on call as listed on AMION.  CRITICAL CARE ATTESTATION This patient is critically ill and at significant risk of neurological worsening, death and care requires constant monitoring of vital signs, hemodynamics,respiratory and cardiac monitoring. I spent 55  minutes of neurocritical care time performing neurological assessment, discussion with family, other specialists and medical decision making of high complexityin the care of  this patient.

## 2018-01-26 NOTE — ED Notes (Signed)
Pt transported to MRI with Benjamine Mola, RN

## 2018-01-26 NOTE — ED Provider Notes (Signed)
[facility] EMERGENCY DEPARTMENT Provider Note   CSN: [id number] Arrival date & time: 01/26/18  4034     History   Chief Complaint No chief complaint on file.   HPI Adam Yang is a 72 y.o. male.  HPI Airway cleared on arrival of this patient who arrives as a code stroke. He is here with his wife who provides the HPI. About 1 hour ago the patient was found to have new right-sided neglect, weakness.  On the patient's wife is a Marine scientist. 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 However, the patient was generally well until onset 1 hour ago. Level 5 caveat secondary to acuity of condition, and the patient is currently not speaking. Past Medical History:  Diagnosis Date  . Anxiety    Claustrophobia  . Arthritis   . Cancer (HCC)    nasopharynx  . Chronic cough    EXCESS MUCUS BUILDUP SINCE ORAL SURGERY FROM CANCER   . Chronic kidney disease    Renal Insuffiency , Creatinine has been in the 2- 3 range, 11/27/14 Creatine 1.4  . Diabetes mellitus without complication ([place])    Type II  . Gout   . History of radiation therapy 12/23/2014- 02/10/2015   Nasopharynx / Neck Adenopathy  . Hyperlipidemia   . Hypertension   . Inguinal hernia   . Right inguinal hernia 08/23/2017    Patient Active Problem List   Diagnosis Date Noted  . Right inguinal hernia 08/23/2017  . Chronic kidney disease, stage III (moderate) ([place]) 02/18/2017  . 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 Other fatigue 06/18/2016  . Essential hypertension 10/17/2015  . Mucositis due to chemotherapy 01/24/2015  . Hyperglycemia due to type 2 diabetes mellitus ([place]) 01/07/2015  . Chronic anxiety 12/19/2014  . Stage 2 chronic renal impairment associated with type 2 diabetes mellitus ([place]) 12/19/2014  . Type 2 diabetes mellitus, controlled, with renal complications ([place])  74/25/9563  . History of nasopharyngeal cancer 11/26/2014    Past Surgical History:  Procedure Laterality Date  . colonoscopy    . COLONOSCOPY W/ POLYPECTOMY    . INGUINAL HERNIA REPAIR Right 08/23/2017   Procedure: OPEN REPAIR RIGHT INGUINAL HERNIA WITH MESH;  Surgeon: Fanny Skates, MD;  Location: [facility];  Service: General;  Laterality: Right;  GENERAL AND TAP BLOCK  . INSERTION OF MESH Right 08/23/2017   Procedure: INSERTION OF MESH;  Surgeon: Fanny Skates, MD;  Location: [facility];  Service: General;  Laterality: Right;  GENERAL AND TAP BLOCK  . KNEE SURGERY Left 1990's   tibia crushed, pinned and bone grafted  . LYMPH NODE BIOPSY    . MULTIPLE EXTRACTIONS WITH ALVEOLOPLASTY N/A 12/06/2014   Procedure: Extraction of tooth #'s 2,4,5,6,7,8,9,10,11,12,13,14,15, 20,22,26,27,28,29 with alveoloplasty.;  Surgeon: Lenn Cal, DDS;  Location: [facility];  Service: Oral Surgery;  Laterality: N/A;        Home Medications    Prior to Admission medications   Medication Sig Start Date End Date Taking? Authorizing Provider  amLODipine (NORVASC) 10 MG tablet Take 10 mg by mouth daily.     [provider]  fenofibrate 160 MG tablet Take 160 mg by mouth daily.    [provider]  guaiFENesin (MUCINEX) 600 MG 12 hr tablet Take by mouth 2 (two) times daily as needed.    [provider]  HYDROcodone-acetaminophen (NORCO) 5-325 MG tablet Take 1-2 tablets by mouth every  6 (six) hours as needed for moderate pain or severe pain. 08/23/17   Fanny Skates, MD  Multiple Vitamins-Minerals (CENTRUM SILVER PO) Take 1 tablet by mouth daily.     [provider]  Omega-3 Fatty Acids (FISH OIL PO) Take 1 capsule by mouth daily.     [provider]    Family History Family History  Problem Relation Age of Onset  . Cancer Mother        throat ca  . Cancer Father        lung ca  . Cancer Brother        throat ca  . Heart block Brother     Social  History Social History   Tobacco Use  . Smoking status: Former Smoker    Packs/day: 1.00    Years: 25.00    Pack years: 25.00    Last attempt to quit: 07/12/1998    Years since quitting: 19.5  . Smokeless tobacco: Never Used  Substance Use Topics  . Alcohol use: No  . Drug use: No     Allergies   Patient has no known allergies.   Review of Systems Review of Systems  Unable to perform ROS: Acuity of condition     Physical Exam Updated Vital Signs There were no vitals taken for this visit.  Physical Exam  Constitutional: He has a sickly appearance.  Sickly elderly M w R sided hemiparesis and neglect  HENT:  Head: Normocephalic and atraumatic.  Eyes: Conjunctivae and EOM are normal.  Cardiovascular: Normal rate and regular rhythm.  Pulmonary/Chest: Effort normal. No stridor. No respiratory distress.  Abdominal: He exhibits no distension.  Musculoskeletal: He exhibits no edema.  Neurological: He is alert. He displays atrophy. He displays no tremor. He exhibits abnormal muscle tone. He displays no seizure activity. Coordination abnormal.  R sided deficit and hemi-paresis  Skin: Skin is warm and dry.  Psychiatric: He has a normal mood and affect.  Nursing note and vitals reviewed.    ED Treatments / Results  Labs (all labs ordered are listed, but only abnormal results are displayed) Labs Reviewed  CBC - Abnormal; Notable for the following components:      Result Value   Hemoglobin 12.4 (*)    MCH 25.7 (*)    All other components within normal limits  COMPREHENSIVE METABOLIC PANEL - Abnormal; Notable for the following components:   Glucose, Bld 122 (*)    BUN 36 (*)    Creatinine, Ser 2.50 (*)    GFR calc non Af Amer 25 (*)    GFR calc Af Amer 28 (*)    All other components within normal limits  I-STAT CHEM 8, ED - Abnormal; Notable for the following components:   BUN 38 (*)    Creatinine, Ser 2.80 (*)    Glucose, Bld 120 (*)    Hemoglobin 12.2 (*)    HCT  36.0 (*)    All other components within normal limits  CBG MONITORING, ED - Abnormal; Notable for the following components:   Glucose-Capillary 101 (*)    All other components within normal limits  ETHANOL  PROTIME-INR  APTT  DIFFERENTIAL  RAPID URINE DRUG SCREEN, HOSP PERFORMED  URINALYSIS, ROUTINE W REFLEX MICROSCOPIC  HIV ANTIBODY (ROUTINE TESTING)  I-STAT TROPONIN, ED    EKG EKG Interpretation  Date/Time:  Thursday January 26 2018 09:44:43 EDT Ventricular Rate:  70 PR Interval:    QRS Duration: 95 QT Interval:  432 QTC Calculation: 467  R Axis:   42 Text Interpretation:  Sinus rhythm Confirmed by Carmin Muskrat [phone]) on 01/26/2018 11:46:25 AM   Radiology Ct Angio Head W Or Wo Contrast  Result Date: 01/26/2018 CLINICAL DATA:  Intracranial hemorrhage. Focal neuro deficit of less than 6 hours, stroke suspected. Personal history of nasopharyngeal carcinoma. EXAM: CT ANGIOGRAPHY HEAD AND NECK TECHNIQUE: Multidetector CT imaging of the head and neck was performed using the standard protocol during bolus administration of intravenous contrast. Multiplanar CT image reconstructions and MIPs were obtained to evaluate the vascular anatomy. Carotid stenosis measurements (when applicable) are obtained utilizing NASCET criteria, using the distal internal carotid diameter as the denominator. CONTRAST:  60mL ISOVUE-370 IOPAMIDOL (ISOVUE-370) INJECTION 76% COMPARISON:  CT head without contrast from the same day. MRI brain without and with contrast 08/15/2017. FINDINGS: CTA NECK FINDINGS Aortic arch: A 3 vessel arch configuration is present. Atherosclerotic calcifications are present at the origin of the left common carotid artery and the left subclavian artery. There is no significant stenosis at either vessel relative to the more distal vessel. Mild narrowing of less than 50% is noted more distally within the left subclavian artery. Additional calcifications are present in the distal arch without  aneurysm or stenosis. Right carotid system: The right common carotid artery is within normal limits. Atherosclerotic changes are present at the right carotid bifurcation and proximal 2.5 cm of the right internal carotid artery. The cervical right ICA is otherwise normal. Left carotid system: The left common carotid artery is within normal limits beyond the origin. Atherosclerotic changes are noted at the left carotid bifurcation and over the proximal 2.5 cm of the left ICA without a significant stenosis relative to the more distal vessel. The cervical left ICA is otherwise normal. Vertebral arteries: The vertebral arteries originate from the subclavian arteries bilaterally. There is mild narrowing of less than 50% at the origins of both vessels. The right vertebral artery is the dominant vessel. No additional stenosis is present in the neck of either vertebral artery. Skeleton: Degenerative endplate changes are present throughout cervical spine. There is fusion across the endplates at A4-1. Uncovertebral spurring contributes to osseous foraminal narrowing on the right at C3-4, C4-5, and C5-6 and on the left most notably at C4-5 and C5-6. Patient is edentulous. No focal lytic or blastic lesions are present. Other neck: Asymmetric fatty atrophy in the left side of the tongue is likely related to treatment. No significant nasopharyngeal mass or adenopathy is present. The thyroid is mildly heterogeneous without a dominant lesion. Upper chest: Centrilobular emphysematous changes are present in the lungs bilaterally. No focal nodule, mass, or airspace disease is present otherwise. The thoracic inlet is otherwise within normal limits. Review of the MIP images confirms the above findings CTA HEAD FINDINGS Anterior circulation: Mild atherosclerotic changes are present within the cavernous and precavernous internal carotid arteries without significant stenosis through the ICA termini. The A1 and M1 segments are within normal  limits. The anterior communicating artery is patent. MCA bifurcations are intact bilaterally. No significant proximal stenosis or occlusion is present. There is no focal aneurysm. No vascular from malformation is present. Left-sided MCA branches are displaced secondary to the hemorrhage. Posterior circulation: The right vertebral artery is the dominant vessel. PICA origins are visualized and normal. Basilar artery is within normal limits. Both posterior cerebral arteries originate from the basilar tip. PCA branch vessels are within normal limits bilaterally. Venous sinuses: The dural sinuses are patent. The straight sinus and deep cerebral veins are intact. Cortical veins  are unremarkable. Anatomic variants: None Delayed phase: A comparison with the noncontrast head CT, there is no definite enhancement. No discrete mass lesion is present. Review of the MIP images confirms the above findings IMPRESSION: 1. No acute or focal vascular lesion to explain the patient's hemorrhage. 2. No definite enhancement or mass lesion. 3. Atherosclerotic changes involving the aortic arch and bilateral carotid bifurcations without a significant stenosis relative to the more distal vessels. 4. Cavernous atherosclerotic changes are much more mild, also without a significant stenosis. 5. Left MCA branch vessels are displaced by the hemorrhagic mass. Electronically Signed   By: San Morelle M.D.   On: 01/26/2018 10:34   Ct Angio Neck W Or Wo Contrast  Result Date: 01/26/2018 CLINICAL DATA:  Intracranial hemorrhage. Focal neuro deficit of less than 6 hours, stroke suspected. Personal history of nasopharyngeal carcinoma. EXAM: CT ANGIOGRAPHY HEAD AND NECK TECHNIQUE: Multidetector CT imaging of the head and neck was performed using the standard protocol during bolus administration of intravenous contrast. Multiplanar CT image reconstructions and MIPs were obtained to evaluate the vascular anatomy. Carotid stenosis measurements  (when applicable) are obtained utilizing NASCET criteria, using the distal internal carotid diameter as the denominator. CONTRAST:  63mL ISOVUE-370 IOPAMIDOL (ISOVUE-370) INJECTION 76% COMPARISON:  CT head without contrast from the same day. MRI brain without and with contrast 08/15/2017. FINDINGS: CTA NECK FINDINGS Aortic arch: A 3 vessel arch configuration is present. Atherosclerotic calcifications are present at the origin of the left common carotid artery and the left subclavian artery. There is no significant stenosis at either vessel relative to the more distal vessel. Mild narrowing of less than 50% is noted more distally within the left subclavian artery. Additional calcifications are present in the distal arch without aneurysm or stenosis. Right carotid system: The right common carotid artery is within normal limits. Atherosclerotic changes are present at the right carotid bifurcation and proximal 2.5 cm of the right internal carotid artery. The cervical right ICA is otherwise normal. Left carotid system: The left common carotid artery is within normal limits beyond the origin. Atherosclerotic changes are noted at the left carotid bifurcation and over the proximal 2.5 cm of the left ICA without a significant stenosis relative to the more distal vessel. The cervical left ICA is otherwise normal. Vertebral arteries: The vertebral arteries originate from the subclavian arteries bilaterally. There is mild narrowing of less than 50% at the origins of both vessels. The right vertebral artery is the dominant vessel. No additional stenosis is present in the neck of either vertebral artery. Skeleton: Degenerative endplate changes are present throughout cervical spine. There is fusion across the endplates at T0-2. Uncovertebral spurring contributes to osseous foraminal narrowing on the right at C3-4, C4-5, and C5-6 and on the left most notably at C4-5 and C5-6. Patient is edentulous. No focal lytic or blastic lesions  are present. Other neck: Asymmetric fatty atrophy in the left side of the tongue is likely related to treatment. No significant nasopharyngeal mass or adenopathy is present. The thyroid is mildly heterogeneous without a dominant lesion. Upper chest: Centrilobular emphysematous changes are present in the lungs bilaterally. No focal nodule, mass, or airspace disease is present otherwise. The thoracic inlet is otherwise within normal limits. Review of the MIP images confirms the above findings CTA HEAD FINDINGS Anterior circulation: Mild atherosclerotic changes are present within the cavernous and precavernous internal carotid arteries without significant stenosis through the ICA termini. The A1 and M1 segments are within normal limits. The anterior communicating  artery is patent. MCA bifurcations are intact bilaterally. No significant proximal stenosis or occlusion is present. There is no focal aneurysm. No vascular from malformation is present. Left-sided MCA branches are displaced secondary to the hemorrhage. Posterior circulation: The right vertebral artery is the dominant vessel. PICA origins are visualized and normal. Basilar artery is within normal limits. Both posterior cerebral arteries originate from the basilar tip. PCA branch vessels are within normal limits bilaterally. Venous sinuses: The dural sinuses are patent. The straight sinus and deep cerebral veins are intact. Cortical veins are unremarkable. Anatomic variants: None Delayed phase: A comparison with the noncontrast head CT, there is no definite enhancement. No discrete mass lesion is present. Review of the MIP images confirms the above findings IMPRESSION: 1. No acute or focal vascular lesion to explain the patient's hemorrhage. 2. No definite enhancement or mass lesion. 3. Atherosclerotic changes involving the aortic arch and bilateral carotid bifurcations without a significant stenosis relative to the more distal vessels. 4. Cavernous  atherosclerotic changes are much more mild, also without a significant stenosis. 5. Left MCA branch vessels are displaced by the hemorrhagic mass. Electronically Signed   By: San Morelle M.D.   On: 01/26/2018 10:34   Ct Head Code Stroke Wo Contrast  Result Date: 01/26/2018 CLINICAL DATA:  Code stroke. Acute onset of right-sided weakness and aphasia. Last seen well 1 hour ago. Focal neuro deficit, less than 6 hours, stroke suspected. EXAM: CT HEAD WITHOUT CONTRAST TECHNIQUE: Contiguous axial images were obtained from the base of the skull through the vertex without intravenous contrast. COMPARISON:  None. FINDINGS: Brain: A parenchymal hemorrhage in the anterior left frontal lobe measures 7.8 x 4.4 x 5.2 cm. There is surrounding vasogenic edema. This results in significant effacement of the adjacent sulci. Midline shift is 4 mm. There is effacement of the left lateral ventricle. No intraventricular hemorrhage is present. Mild white matter changes are noted bilaterally. No other cortical infarct is present. Basal ganglia are intact. The brainstem and cerebellum are normal. Vascular: Atherosclerotic calcifications are present within the cavernous internal carotid arteries bilaterally. There is no hyperdense vessel. Skull: Calvarium is intact. No focal lytic or blastic lesions are present. No significant extracranial soft tissue lesion is present. Sinuses/Orbits: Mild mucosal thickening is present posteriorly in the right maxillary sinus. Some wall thickening of the maxillary sinuses bilaterally suggest chronic disease. No acute fluid levels are present. The remaining paranasal sinuses in the mastoid air cells are clear. Globes and orbits are within normal limits. IMPRESSION: 1. 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 2. Screening vasogenic edema and mass effect with 4 mm of midline shift and effacement of the left lateral ventricle. 3. Mild generalized white matter  disease likely reflects the sequela of chronic microvascular ischemia The above was relayed via text pager to Dr. Rory Percy on 01/26/2018 at 09:28 . Electronically Signed   By: San Morelle M.D.   On: 01/26/2018 09:37    Procedures Procedures (including critical care time)  Medications Ordered in ED Medications - No data to display   Initial Impression / Assessment and Plan / ED Course  I have reviewed the triage vital signs and the nursing notes.  Pertinent labs & imaging results that were available during my care of the patient were reviewed by me and considered in my medical decision making (see chart for details).    Patient had emergent head CT after arrival, after clearing of his airway.  Patient's head  CT notable for substantial left-sided intraparenchymal hemorrhage. Patient came in hypertensive, 578+ systolic, has been started on Cleviprex. After titration, patient's blood pressure now 978 systolic. Patient now accompanied by multiple family members as well. 11:46 AM Patient was unable to tolerate MRI, given his weakness, and need for Ativan according to his wife, the patient had diminished interactivity, and procedure was not worth the possibility of the patient lying supine for its duration. On returning to the emergency department patient is listless, with protected airway. Given his change in interactivity, head CT will be repeated, and the patient will be admitted to the neurologic ICU for further evaluation and management.   Final Clinical Impressions(s) / ED Diagnoses  Hemorrhagic stroke, initial encounter  CRITICAL CARE Performed by: Carmin Muskrat Total critical care time: 45 minutes Critical care time was exclusive of separately billable procedures and treating other patients. Critical care was necessary to treat or prevent imminent or life-threatening deterioration. Critical care was time spent personally by me on the following activities: development of  treatment plan with patient and/or surrogate as well as nursing, discussions with consultants, evaluation of patient's response to treatment, examination of patient, obtaining history from patient or surrogate, ordering and performing treatments and interventions, ordering and review of laboratory studies, ordering and review of radiographic studies, pulse oximetry and re-evaluation of patient's condition.    Carmin Muskrat, MD 01/26/18 1147

## 2018-01-26 NOTE — ED Notes (Signed)
Pharm called to bring another bottle of CLeviprex to make sure it does not run out during scan.

## 2018-01-26 NOTE — Code Documentation (Signed)
72 yo male coming from home with family. Family reported sudden onset of right sided weakness and right facial droop that started at 0815. Pt has hx of nasopharyngeal cancer. EMS called and activated a Code Stroke. Stroke Team met patient upon arrival. Initial NIHSS 24 due to inability to answer questions or follow commands, left sided partial gaze, right visual deficits, inability to move right arm or leg, mute, and neglect of the right side. CT completed and patient noted to have hemorrhage. EMS noted BP 220/118. 10 mg of Labetalol given. BP decreased with Cleviprex. BP goal <140. Handoff given to [facility], Therapist, sports.

## 2018-01-26 NOTE — ED Notes (Addendum)
Patient presents to the ED from home. Per EMS patient woke up and was with wife and was walking and talking. Family states at [place] they found him not taking and moving right side. Family denies any history of strokes in past or being on any blood thinners. Per EMS patient has history of nasopharyngeal ca. Vital with EMS 214/118 HE 88 o2 96 CBg 117. Upon arrival patient able to move left side, complete flaccid on right.

## 2018-01-26 NOTE — Consult Note (Deleted)
STROKE CONSULT      HPI:                                                                                                                                         Adam Yang is an 72 y.o. male with past medical history of hypertension, diabetes, hyperlipidemia past radiation for nasopharynx cancer.  Patient was well this morning until approximately 8:15 in the morning when he had told his wife something was wrong.  She noted that he was flaccid on the right side placed in the bed and called EMS.  EMS quickly came and brought him to [facility] as a code stroke.  Upon entering the bridge it was noted that patient could not cross midline to the right and was flaccid on the right.  Patient quickly was brought to CT scan which showed a left frontoparietal bleed.  Patient's blood pressure was quickly addressed with Cleviprex and labetalol.  We attempted to obtain an MRI of the brain but the patient's heart rate kept dropping to the 30s.  MRI was aborted.  Date last known well: Date: 01/26/2018 Time last known well: Time: 08:15 tPA Given: No: Intracranial bleed Modified Rankin: Rankin Score=0  Intracerebral Hemorrhage (ICH) Score Total: 3  Past Medical History:  Diagnosis Date  . Anxiety    Claustrophobia  . Arthritis   . Cancer (HCC)    nasopharynx  . Chronic cough    EXCESS MUCUS BUILDUP SINCE ORAL SURGERY FROM CANCER   . Chronic kidney disease    Renal Insuffiency , Creatinine has been in the 2- 3 range, 11/27/14 Creatine 1.4  . Diabetes mellitus without complication ([place])    Type II  . Gout   . History of radiation therapy 12/23/2014- 02/10/2015   Nasopharynx / Neck Adenopathy  . Hyperlipidemia   . Hypertension   . Inguinal hernia   . Right inguinal hernia 08/23/2017   Past Surgical History:  Procedure Laterality Date  . colonoscopy    . COLONOSCOPY W/ POLYPECTOMY    . INGUINAL HERNIA REPAIR Right 08/23/2017   Procedure: OPEN REPAIR RIGHT INGUINAL HERNIA WITH MESH;   Surgeon: Fanny Skates, MD;  Location: [facility];  Service: General;  Laterality: Right;  GENERAL AND TAP BLOCK  . INSERTION OF MESH Right 08/23/2017   Procedure: INSERTION OF MESH;  Surgeon: Fanny Skates, MD;  Location: [facility];  Service: General;  Laterality: Right;  GENERAL AND TAP BLOCK  . KNEE SURGERY Left 1990's   tibia crushed, pinned and bone grafted  . LYMPH NODE BIOPSY    . MULTIPLE EXTRACTIONS WITH ALVEOLOPLASTY N/A 12/06/2014   Procedure: Extraction of tooth #'s 2,4,5,6,7,8,9,10,11,12,13,14,15, 20,22,26,27,28,29 with alveoloplasty.;  Surgeon: Lenn Cal, DDS;  Location: [facility];  Service: Oral Surgery;  Laterality: N/A;   Family History  Problem Relation Age of Onset  . Cancer Mother  throat ca  . Cancer Father        lung ca  . Cancer Brother        throat ca  . Heart block Brother          Social History:  reports that he quit smoking about 19 years ago. He has a 25.00 pack-year smoking history. He has never used smokeless tobacco. He reports that he does not drink alcohol or use drugs.  Allergies: No Known Allergies  Medications:                                                                                                                           Current Facility-Administered Medications  Medication Dose Route Frequency Provider Last Rate Last Dose  .  stroke: mapping our early stages of recovery book   Does not apply Once Marliss Coots, PA-C      . clevidipine (CLEVIPREX) 0.5 MG/ML infusion           . clevidipine (CLEVIPREX) infusion 0.5 mg/mL  0-21 mg/hr Intravenous Continuous Amie Portland, MD      . senna-docusate (Senokot-S) tablet 1 tablet  1 tablet Oral BID Marliss Coots, PA-C       Current Outpatient Medications  Medication Sig Dispense Refill  . amLODipine (NORVASC) 10 MG tablet Take 10 mg by mouth daily.     . fenofibrate 160 MG tablet Take 160 mg by mouth daily.    Marland Kitchen guaiFENesin (MUCINEX) 600 MG 12 hr tablet Take by mouth 2 (two) times  daily as needed.    Marland Kitchen HYDROcodone-acetaminophen (NORCO) 5-325 MG tablet Take 1-2 tablets by mouth every 6 (six) hours as needed for moderate pain or severe pain. 20 tablet 0  . Multiple Vitamins-Minerals (CENTRUM SILVER PO) Take 1 tablet by mouth daily.     . Omega-3 Fatty Acids (FISH OIL PO) Take 1 capsule by mouth daily.      ROS:                                                                                                                                       History obtained from Wife  General ROS: negative for - chills, fatigue, fever, night sweats, weight gain or weight loss Psychological ROS: negative for - , hallucinations, memory difficulties, mood swings or  Ophthalmic ROS: negative for - blurry vision, double vision,  eye pain or loss of vision ENT ROS: Positive for -severe dry mouth and recent nasal discharge Respiratory ROS: negative for - cough,  shortness of breath or wheezing Cardiovascular ROS: negative for - chest pain, dyspnea on exertion,  Gastrointestinal ROS: negative for - abdominal pain, diarrhea,  nausea/vomiting or stool incontinence Genito-Urinary ROS: negative for - dysuria, hematuria, incontinence or urinary frequency/urgency Musculoskeletal ROS: negative for - joint swelling or muscular weakness Neurological ROS: as noted in HPI   General Examination:                                                                                                      Weight 62.5 kg (137 lb 12.6 oz).  HEENT-  Normocephalic, no lesions, without obvious abnormality.  Normal external eye and conjunctiva.   Cardiovascular- S1-S2 audible, pulses palpable throughout   Extremities- Warm, dry and intact Musculoskeletal-no joint tenderness, deformity or swelling Skin-warm and dry, no hyperpigmentation, vitiligo, or suspicious lesions NEURO:  Mental Status: Awake, alert, in no distress Language: mute, not following commands. Cranial Nerves: PERRL. Left gaze preference, can not cross  midline to look to right, visual fields right homonymous hemianopsia, right lower face weakness,  Motor: flaccid RUE and RLE, normal LUE and LLE Tone: is normal and bulk is normal Sensation- severely diminished sensation on right as no grimace to nox stim Coordination: can not assess due to mentation Gait- deferred    Lab Results: Basic Metabolic Panel: Recent Labs  Lab 01/26/18 0919  NA 140  K 4.0  CL 108  GLUCOSE 120*  BUN 38*  CREATININE 2.80*    CBC: Recent Labs  Lab 01/26/18 0913 01/26/18 0919  WBC 5.8  --   NEUTROABS 2.8  --   HGB 12.4* 12.2*  HCT 39.6 36.0*  MCV 82.2  --   PLT 324  --    CBG: Recent Labs  Lab 01/26/18 0913  GLUCAP 101*    Assessment and plan discussed with with attending physician and they are in agreement.    Etta Quill PA-C [facility] [phone]  01/26/2018, 9:26 AM  Attending Neurohospitalist Addendum Patient seen and examined with APP/Resident. Agree with the history and physical as documented above. I have independently reviewed the chart, obtained history, review of systems and examined the patient.I have personally reviewed pertinent head/neck/spine imaging (CT/MRI). CT head: Left cerebral hemispheric hemorrhage >90cc with vasogenic edema CT angio-no vascular malformation, there is no spot sign MRI will be ordered shortly  Assessment: 72 y.o. male 28 most common ED after sudden onset of right facial droop, right arm and leg flaccidity. CT scan shows left frontoparietal bleed. He was hypertensive but also has h/o nasopharyngeal cancer - so differentials include HTN bleed vs Hemorrhagic metastasis. MRI will be done to further evaluate Stroke Risk Factors - diabetes mellitus, hyperlipidemia and hypertension  Recommend SBP goal <140. Cardene/labetalol PRN MRI brain with without stat.  Please feel free to call with any questions. --- Amie Portland, MD [facility] Pager: [phone]  If 7pm to  7am, please call on call as listed on AMION.  Addendum Patient unable to tolerate the MRI. Given that he presented with severe hypertension, we will for now admit him as a hypertensive bleed to the neuro ICU. He will require MRI at some point. I had a detailed conversation about the plan with the family and answered all the questions. We will obtain a repeat CT head in about 6 hours.  If there is any expansion of the hematoma, will consider neurosurgical consultation.  Plan: Subcortical ICH, nontraumatic Cortical ICH, nontraumatic Acuity: Acute Laterality: left Current suspected etiology:  HTN vs Mets  Treatment: -Admit to NICU -ICH Score:3 -ICH Volume:93cc -BP control goal SYS<140 -PT/OT/ST  -neuromonitoring  CNS Cerebral edema -Close neuro monitoring -Repeat head CT at 3:30 PM  -NPO until cleared by speech -ST -Advance diet as tolerated -May need PEG  Hemiplegia and hemiparesis following nontraumatic intracerebral hemorrhage affecting right dominant side  -Continue PT/OT/ST  RESP No acute issues for now. 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 -Monitor -transfuse for hgb < 7  ENDO -goal HgbA1c < 7  Fluid/Electrolyte Disorders -Repeat labs -Trend  ID Possible Aspiration PNA -CXR -NPO -Monitor  Prophylaxis DVT: SCDs only-do not use antiplatelets or anticoagulants GI: Not applicable Bowel: Docusate/senna  Dispo: IP Rehab  Diet: NPO until cleared by speech  Code Status: DNR -wife reports he has a living will and a DNR in place   THE FOLLOWING WERE PRESENT  ON ADMISSION: CNS -  Cerebral Edema,ICH, Hemiplegia Respiratory - probable Aspiration Pneumonia, Non-invasive Mech Ventilation (BiPAP) Cardiovascular -hypertensive emergency Renal -  CKD Heme-  Anemia Cancer - Primary site-nasopharyngeal DNR   -- Amie Portland, MD [facility] Pager: [phone] If 7pm to 7am, please call on call as listed on AMION.  CRITICAL CARE ATTESTATION This patient is critically ill and at significant risk of neurological worsening, death and care requires constant monitoring of vital signs, hemodynamics,respiratory and cardiac monitoring. I spent 55  minutes of neurocritical care time performing neurological assessment, discussion with family, other specialists and medical decision making of high complexityin the care of  this patient.

## 2018-01-26 NOTE — ED Notes (Signed)
RN called EDP to update that patient is more lethargic- ativan given about 30 mins ago. Some snoring respirations that wife states can be baseline for him. Pupils sluggish with R being more sluggish to respond than left. Right side remains flaccid and patient nonverbal. Report given to [place], Therapist, sports. Pt heading into scanner now.

## 2018-01-26 NOTE — ED Notes (Addendum)
Pt to have MRI prior to go to floor per Dr. Rory Percy. This RN updated receiving RN on 4N

## 2018-01-26 NOTE — ED Notes (Signed)
Patient placed on MRI table Vitals signs stable. Once in machine, patients HR dropped to 30. RN advised to take patient  Off table patient HR returned to 80's once out of MRI. RN attempted one more time HR dropped again to 30, RN advised to take patient out of machine. RN verified HR with radial pulse. Patient HR returned to 80's once out of the machine. RN notified Neuro MD and EDP. Patient now more lethargic at this time.

## 2018-01-27 ENCOUNTER — Inpatient Hospital Stay (HOSPITAL_COMMUNITY): Payer: Medicare Other

## 2018-01-27 DIAGNOSIS — Z85819 Personal history of malignant neoplasm of unspecified site of lip, oral cavity, and pharynx: Secondary | ICD-10-CM

## 2018-01-27 DIAGNOSIS — I161 Hypertensive emergency: Secondary | ICD-10-CM

## 2018-01-27 DIAGNOSIS — I6789 Other cerebrovascular disease: Secondary | ICD-10-CM

## 2018-01-27 DIAGNOSIS — E785 Hyperlipidemia, unspecified: Secondary | ICD-10-CM

## 2018-01-27 DIAGNOSIS — I615 Nontraumatic intracerebral hemorrhage, intraventricular: Secondary | ICD-10-CM

## 2018-01-27 LAB — BASIC METABOLIC PANEL
Anion gap: 13 (ref 5–15)
BUN: 40 mg/dL — AB (ref 8–23)
CHLORIDE: 104 mmol/L (ref 98–111)
CO2: 22 mmol/L (ref 22–32)
CREATININE: 2.64 mg/dL — AB (ref 0.61–1.24)
Calcium: 9.7 mg/dL (ref 8.9–10.3)
GFR calc Af Amer: 27 mL/min — ABNORMAL LOW (ref >60)
GFR calc non Af Amer: 23 mL/min — ABNORMAL LOW (ref >60)
GLUCOSE: 155 mg/dL — AB (ref 70–99)
POTASSIUM: 4.1 mmol/L (ref 3.5–5.1)
SODIUM: 139 mmol/L (ref 135–145)

## 2018-01-27 LAB — GLUCOSE, CAPILLARY
GLUCOSE-CAPILLARY: 161 mg/dL — AB (ref 70–99)
Glucose-Capillary: 140 mg/dL — ABNORMAL HIGH (ref 70–99)
Glucose-Capillary: 129 mg/dL — ABNORMAL HIGH (ref 70–99)

## 2018-01-27 LAB — CBC
HCT: 37.8 % — ABNORMAL LOW (ref 39.0–52.0)
HEMOGLOBIN: 12.2 g/dL — AB (ref 13.0–17.0)
MCH: 26.3 pg (ref 26.0–34.0)
MCHC: 32.3 g/dL (ref 30.0–36.0)
MCV: 81.6 fL (ref 78.0–100.0)
PLATELETS: 403 10*3/uL — AB (ref 150–400)
RBC: 4.63 MIL/uL (ref 4.22–5.81)
RDW: 14.6 % (ref 11.5–15.5)
WBC: 11 10*3/uL — ABNORMAL HIGH (ref 4.0–10.5)

## 2018-01-27 LAB — ECHOCARDIOGRAM COMPLETE
Height: 69 in
WEIGHTICAEL: 2211.65 [oz_av]

## 2018-01-27 LAB — HEMOGLOBIN A1C
HEMOGLOBIN A1C: 6 % — AB (ref 4.8–5.6)
MEAN PLASMA GLUCOSE: 125.5 mg/dL

## 2018-01-27 LAB — LIPID PANEL
CHOL/HDL RATIO: 4.7 ratio
CHOLESTEROL: 231 mg/dL — AB (ref 0–200)
HDL: 49 mg/dL (ref >40)
LDL Cholesterol: 111 mg/dL — ABNORMAL HIGH (ref 0–99)
Triglycerides: 357 mg/dL — ABNORMAL HIGH (ref <150)
VLDL: 71 mg/dL — ABNORMAL HIGH (ref 0–40)

## 2018-01-27 LAB — MAGNESIUM
Magnesium: 2.4 mg/dL (ref 1.7–2.4)
Magnesium: 2.9 mg/dL — ABNORMAL HIGH (ref 1.7–2.4)

## 2018-01-27 LAB — PHOSPHORUS
Phosphorus: 4.7 mg/dL — ABNORMAL HIGH (ref 2.5–4.6)
Phosphorus: 5 mg/dL — ABNORMAL HIGH (ref 2.5–4.6)

## 2018-01-27 LAB — TRIGLYCERIDES: Triglycerides: 348 mg/dL — ABNORMAL HIGH (ref <150)

## 2018-01-27 LAB — HIV ANTIBODY (ROUTINE TESTING W REFLEX): HIV Screen 4th Generation wRfx: NONREACTIVE

## 2018-01-27 MED ORDER — LISINOPRIL 20 MG PO TABS
20.0000 mg | ORAL_TABLET | Freq: Every day | ORAL | Status: DC
  Administered 2018-01-27 – 2018-01-29 (×3): 20 mg via ORAL
  Filled 2018-01-27 (×3): qty 1

## 2018-01-27 MED ORDER — SODIUM CHLORIDE 0.9 % IV SOLN
INTRAVENOUS | Status: DC
  Administered 2018-01-27 – 2018-02-02 (×6): via INTRAVENOUS

## 2018-01-27 MED ORDER — AMLODIPINE BESYLATE 10 MG PO TABS
10.0000 mg | ORAL_TABLET | Freq: Every day | ORAL | Status: DC
  Administered 2018-01-27 – 2018-02-02 (×7): 10 mg via ORAL
  Filled 2018-01-27 (×7): qty 1

## 2018-01-27 MED ORDER — ADULT MULTIVITAMIN LIQUID CH
15.0000 mL | Freq: Every day | ORAL | Status: DC
  Administered 2018-01-27 – 2018-02-02 (×7): 15 mL
  Filled 2018-01-27 (×7): qty 15

## 2018-01-27 MED ORDER — AMLODIPINE BESYLATE 5 MG PO TABS: 5.0000 mg | ORAL_TABLET | Freq: Every day | ORAL | Status: DC

## 2018-01-27 MED ORDER — OSMOLITE 1.2 CAL PO LIQD
1000.0000 mL | ORAL | Status: DC
  Administered 2018-01-27 – 2018-01-29 (×2): 1000 mL
  Filled 2018-01-27 (×5): qty 1000

## 2018-01-27 MED ORDER — FENOFIBRATE 160 MG PO TABS
160.0000 mg | ORAL_TABLET | Freq: Every day | ORAL | Status: DC
  Administered 2018-01-27 – 2018-02-02 (×7): 160 mg via ORAL
  Filled 2018-01-27 (×7): qty 1

## 2018-01-27 MED ORDER — PRO-STAT SUGAR FREE PO LIQD
30.0000 mL | Freq: Every day | ORAL | Status: DC
  Administered 2018-01-27 – 2018-02-02 (×7): 30 mL
  Filled 2018-01-27 (×7): qty 30

## 2018-01-27 NOTE — Progress Notes (Signed)
   01/27/18 0700  Clinical Encounter Type  Visited With Patient  Visit Type Spiritual support  Referral From Nurse  Consult/Referral To Chaplain  Spiritual Encounters  Spiritual Needs Prayer  Stress Factors  Patient Stress Factors Major life changes  Chaplain was rounding on the on call shift and visited with PT, PT was sleeping at the time of Chaplain rounding.  Chaplain proceeded to pray FOR the PT.  Chaplain will revisit to pray with the PT as requested

## 2018-01-27 NOTE — Evaluation (Signed)
Occupational Therapy Evaluation Patient Details Name: Adam Yang MRN: [id number] DOB: 1945/08/18 Today's Date: 01/27/2018    History of Present Illness [name] is a 72 y.o. male with history of hypertension, hyperlipidemia, nasopharyngeal cancer status post radiation, diabetes mellitus, chronic kidney disease, anxiety and claustrophobia presenting right-sided flaccidity visual deficits, and bradycardia. 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 Unchanged 4 mm rightward midline shift.    Clinical Impression   PT admitted with CVA. Pt currently with functional limitiations due to the deficits listed below (see OT problem list). Pt currently with flaccid R UE that affects all adls and global aphasia. Pt answers yes and no during session inconsistently.  Pt will benefit from skilled OT to increase their independence and safety with adls and balance to allow discharge CIR.     Follow Up Recommendations  CIR    Equipment Recommendations  Wheelchair (measurements OT);Wheelchair cushion (measurements OT);3 in 1 bedside commode    Recommendations for Other Services Rehab consult     Precautions / Restrictions Precautions Precautions: Fall      Mobility Bed Mobility Overal bed mobility: Needs Assistance Bed Mobility: Supine to Sit     Supine to sit: +2 for physical assistance;Max assist     General bed mobility comments: pt with posterior lean to the L initially and needs (A) to elevate trunk  Transfers Overall transfer level: Needs assistance Equipment used: 2 person hand held assist Transfers: Stand Pivot Transfers;Sit to/from Stand Sit to Stand: Mod assist;+2 physical assistance;+2 safety/equipment Stand pivot transfers: Mod assist;+2 physical assistance;+2 safety/equipment       General transfer comment: pt initiated steppnig to chair on the L side. pt needs R LE blocked. pt shifting all weigh tot he L LE and R LE TDWB     Balance Overall balance assessment: Needs assistance   Sitting balance-Leahy Scale: Poor       Standing balance-Leahy Scale: Poor                             ADL either performed or assessed with clinical judgement   ADL Overall ADL's : Needs assistance/impaired Eating/Feeding: NPO   Grooming: Maximal assistance   Upper Body Bathing: Maximal assistance   Lower Body Bathing: Total assistance   Upper Body Dressing : Maximal assistance   Lower Body Dressing: Total assistance   Toilet Transfer: +2 for safety/equipment;Moderate assistance             General ADL Comments: pt verbalized "yes" several times in the session but not consistently in a correct use. pT reports "yes" when looking out the window "is it raining" pt answer "YES" when asked "is your birthday in [birth year]"     Vision         Perception     Praxis      Pertinent Vitals/Pain Pain Assessment: No/denies pain Faces Pain Scale: No hurt     Hand Dominance (unknown)   Extremity/Trunk Assessment Upper Extremity Assessment Upper Extremity Assessment: RUE deficits/detail RUE Deficits / Details: flaccid brunstrom I   Lower Extremity Assessment Lower Extremity Assessment: Defer to PT evaluation RLE Deficits / Details: Hip flexor, knee extensor, ankle dorsiflexion 2/5; tending to hyperextend R knee in standing; minimal to no RLE muscle activation with weight shift onto RLE   Cervical / Trunk Assessment Cervical / Trunk Assessment: Normal   Communication Communication Communication: Expressive difficulties;Other (comment)(See also SLP Evaluation)   Cognition Arousal/Alertness: Awake/alert  Behavior During Therapy: WFL for tasks assessed/performed Overall Cognitive Status: Difficult to assess                                 General Comments: Answers yes/no questions correclty about 50% of time; makes attempts to say his name, lots of difficulty   General Comments        Exercises     Shoulder Instructions      Home Living Family/patient expects to be discharged to:: Inpatient rehab Living Arrangements: Spouse/significant other                               Additional Comments: Unable to glean information about PLOF or home setup from pt  Lives With: Spouse    Prior Functioning/Environment          Comments: Unknown        OT Problem List: Decreased strength;Decreased range of motion;Decreased activity tolerance;Impaired balance (sitting and/or standing);Decreased safety awareness;Decreased knowledge of use of DME or AE;Decreased knowledge of precautions      OT Treatment/Interventions: Self-care/ADL training;Therapeutic exercise;DME and/or AE instruction;Therapeutic activities;Balance training;Patient/family education;Cognitive remediation/compensation    OT Goals(Current goals can be found in the care plan section) Acute Rehab OT Goals Patient Stated Goal: none stated OT Goal Formulation: Patient unable to participate in goal setting Time For Goal Achievement: 02/10/18 Potential to Achieve Goals: Good  OT Frequency: Min 3X/week   Barriers to D/C:            Co-evaluation PT/OT/SLP Co-Evaluation/Treatment: Yes Reason for Co-Treatment: Complexity of the patient's impairments (multi-system involvement);Necessary to address cognition/behavior during functional activity;For patient/therapist safety;To address functional/ADL transfers PT goals addressed during session: Mobility/safety with mobility OT goals addressed during session: ADL's and self-care;Proper use of Adaptive equipment and DME;Strengthening/ROM      AM-PAC PT "6 Clicks" Daily Activity     Outcome Measure Help from another person eating meals?: Total Help from another person taking care of personal grooming?: Total Help from another person toileting, which includes using toliet, bedpan, or urinal?: Total Help from another person bathing (including washing,  rinsing, drying)?: Total Help from another person to put on and taking off regular upper body clothing?: Total Help from another person to put on and taking off regular lower body clothing?: Total 6 Click Score: 6   End of Session Equipment Utilized During Treatment: Gait belt Nurse Communication: Mobility status;Precautions  Activity Tolerance: Patient tolerated treatment well Patient left: in chair;with call bell/phone within reach;with chair alarm set  OT Visit Diagnosis: Unsteadiness on feet (R26.81);Muscle weakness (generalized) (M62.81)                Time: 3500-9381 OT Time Calculation (min): 23 min Charges:  OT General Charges $OT Visit: 1 Visit OT Evaluation $OT Eval Moderate Complexity: 1 Mod G-Codes:      Jeri Modena   OTR/L Pager: [phone] Office: [phone] .   Parke Poisson B 01/27/2018, 4:03 PM

## 2018-01-27 NOTE — Evaluation (Signed)
Speech Language Pathology Evaluation Patient Details Name: Adam Yang MRN: [id number] DOB: 1945/09/09 Today's Date: 01/27/2018 Time: 1100-1120 SLP Time Calculation (min) (ACUTE ONLY): 20 min  Problem List:  Patient Active Problem List   Diagnosis Date Noted  . Intracerebral hemorrhage 01/27/2018  . Intracranial bleed ([place]) 01/26/2018  . Right inguinal hernia 08/23/2017  . Chronic kidney disease, stage III (moderate) ([place]) 02/18/2017  . 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 Other fatigue 06/18/2016  . Essential hypertension 10/17/2015  . Mucositis due to chemotherapy 01/24/2015  . Hyperglycemia due to type 2 diabetes mellitus ([place]) 01/07/2015  . Chronic anxiety 12/19/2014  . Stage 2 chronic renal impairment associated with type 2 diabetes mellitus ([place]) 12/19/2014  . Type 2 diabetes mellitus, controlled, with renal complications ([place]) 26/83/4196  . History of nasopharyngeal cancer 11/26/2014   Past Medical History:  Past Medical History:  Diagnosis Date  . Anxiety    Claustrophobia  . Arthritis   . Cancer (HCC)    nasopharynx  . Chronic cough    EXCESS MUCUS BUILDUP SINCE ORAL SURGERY FROM CANCER   . Chronic kidney disease    Renal Insuffiency , Creatinine has been in the 2- 3 range, 11/27/14 Creatine 1.4  . Diabetes mellitus without complication ([place])    Type II  . Gout   . History of radiation therapy 12/23/2014- 02/10/2015   Nasopharynx / Neck Adenopathy  . Hyperlipidemia   . Hypertension   . Inguinal hernia   . Right inguinal hernia 08/23/2017   Past Surgical History:  Past Surgical History:  Procedure Laterality Date  . colonoscopy    . COLONOSCOPY W/ POLYPECTOMY    . INGUINAL HERNIA REPAIR Right 08/23/2017   Procedure: OPEN REPAIR RIGHT INGUINAL HERNIA WITH MESH;  Surgeon: Fanny Skates, MD;  Location: [facility];  Service: General;  Laterality: Right;  GENERAL AND TAP BLOCK  . INSERTION OF MESH Right 08/23/2017    Procedure: INSERTION OF MESH;  Surgeon: Fanny Skates, MD;  Location: [facility];  Service: General;  Laterality: Right;  GENERAL AND TAP BLOCK  . KNEE SURGERY Left 1990's   tibia crushed, pinned and bone grafted  . LYMPH NODE BIOPSY    . MULTIPLE EXTRACTIONS WITH ALVEOLOPLASTY N/A 12/06/2014   Procedure: Extraction of tooth #'s 2,4,5,6,7,8,9,10,11,12,13,14,15, 20,22,26,27,28,29 with alveoloplasty.;  Surgeon: Lenn Cal, DDS;  Location: [facility];  Service: Oral Surgery;  Laterality: N/A;   HPI:  Mr. Lamonte Hartt is a 72 y.o. male with history of hypertension, hyperlipidemia, nasopharyngeal cancer status post radiation, diabetes mellitus, chronic kidney disease, anxiety and claustrophobia presenting right-sided flaccidity visual deficits, and bradycardia. 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 Unchanged 4 mm rightward midline shift.  Esophagram on 01/27/18 showed normal pharyngeal function, tertiary contractions consistent with esophageal dysmotility.    Assessment / Plan / Recommendation Clinical Impression  Pt demonstrates aphasia with moderate comprehension deficits; Pt is able to follow approximately 50% of commands with contextual cues, but is unable to identify single words and objects. Expression also severely impaired; pt is dysarthric and paraphasic, but with Melodic Intonation cues he is able to count to 10 and say his name intelligibly. He recognizes his name in writing, but cannot say it. He is visibly frusterated with communication attempts. Recommend ongoing SLP interventions, possibly at CIR level. Will follow to faciliate communication.     SLP Assessment  SLP Recommendation/Assessment: Patient needs continued Speech Lanaguage Pathology Services SLP Visit Diagnosis: Aphasia (R47.01);Dysarthria  and anarthria (R47.1)    Follow Up Recommendations  Inpatient Rehab    Frequency and Duration min 2x/week  2 weeks      SLP  Evaluation Cognition  Overall Cognitive Status: Difficult to assess Orientation Level: Other (comment)(UTA) Attention: Sustained Sustained Attention: Appears intact       Comprehension  Auditory Comprehension Overall Auditory Comprehension: Impaired Yes/No Questions: Impaired Basic Biographical Questions: 26-50% accurate Commands: Impaired One Step Basic Commands: 50-74% accurate Conversation: Simple Visual Recognition/Discrimination Discrimination: Exceptions to [facility] Common Objects: Unable to indentify    Expression Verbal Expression Overall Verbal Expression: Impaired Initiation: Impaired Automatic Speech: Name;Social Response;Counting(with MIT) Level of Generative/Spontaneous Verbalization: Word Repetition: Impaired Level of Impairment: Word level Naming: Impairment Responsive: Not tested Confrontation: Impaired Common Objects: Unable to indentify Convergent: 0-24% accurate Interfering Components: Speech intelligibility   Oral / Motor  Oral Motor/Sensory Function Overall Oral Motor/Sensory Function: Moderate impairment Facial ROM: Reduced left;Suspected CN VII (facial) dysfunction Facial Symmetry: Abnormal symmetry left;Suspected CN VII (facial) dysfunction Facial Strength: Reduced left;Suspected CN VII (facial) dysfunction Lingual ROM: Reduced right;Reduced left Lingual Strength: Reduced Motor Speech Overall Motor Speech: Impaired Respiration: Within functional limits Phonation: Low vocal intensity Articulation: Impaired Level of Impairment: Word   GO                    Ortha Metts, Katherene Ponto 01/27/2018, 1:34 PM

## 2018-01-27 NOTE — Evaluation (Signed)
Physical Therapy Evaluation Patient Details Name: Adam Yang MRN: [id number] DOB: 08-Mar-1946 Today's Date: 01/27/2018   History of Present Illness  [name] is a 72 y.o. male with history of hypertension, hyperlipidemia, nasopharyngeal cancer status post radiation, diabetes mellitus, chronic kidney disease, anxiety and claustrophobia presenting right-sided flaccidity visual deficits, and bradycardia. 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 Unchanged 4 mm rightward midline shift.   Clinical Impression   Pt admitted with above diagnosis. Pt currently with functional limitations due to the deficits listed below (see PT Problem List). PLOF is unknown; Presents with R hemiparesis, decr functional mobility, global aphasia; Will need more information about home situation and available assist;  Pt will benefit from skilled PT to increase their independence and safety with mobility to allow discharge to the venue listed below.       Follow Up Recommendations CIR    Equipment Recommendations  Other (comment)(To be determined)    Recommendations for Other Services Rehab consult     Precautions / Restrictions Precautions Precautions: Fall      Mobility  Bed Mobility Overal bed mobility: Needs Assistance Bed Mobility: Supine to Sit     Supine to sit: +2 for physical assistance;Max assist     General bed mobility comments: pt with posterior lean to the L initially and needs (A) to elevate trunk  Transfers Overall transfer level: Needs assistance Equipment used: 2 person hand held assist Transfers: Stand Pivot Transfers;Sit to/from Stand Sit to Stand: Mod assist;+2 physical assistance;+2 safety/equipment Stand pivot transfers: Mod assist;+2 physical assistance;+2 safety/equipment       General transfer comment: pt initiated steppnig to chair on the L side. pt needs R LE blocked. pt shifting all weigh tot he L LE and R LE  TDWB  Ambulation/Gait                Stairs            Wheelchair Mobility    Modified Rankin (Stroke Patients Only) Modified Rankin (Stroke Patients Only) Pre-Morbid Rankin Score: No symptoms Modified Rankin: Severe disability     Balance Overall balance assessment: Needs assistance   Sitting balance-Leahy Scale: Poor       Standing balance-Leahy Scale: Poor                               Pertinent Vitals/Pain Pain Assessment: No/denies pain Faces Pain Scale: No hurt    Home Living Family/patient expects to be discharged to:: Inpatient rehab Living Arrangements: Spouse/significant other               Additional Comments: Unable to glean information about PLOF or home setup from pt    Prior Function           Comments: Unknown     Hand Dominance   Dominant Hand: (unknown)    Extremity/Trunk Assessment   Upper Extremity Assessment Upper Extremity Assessment: RUE deficits/detail RUE Deficits / Details: flaccid brunstrom I    Lower Extremity Assessment Lower Extremity Assessment: Defer to PT evaluation RLE Deficits / Details: Hip flexor, knee extensor, ankle dorsiflexion 2/5; tending to hyperextend R knee in standing; minimal to no RLE muscle activation with weight shift onto RLE    Cervical / Trunk Assessment Cervical / Trunk Assessment: Normal  Communication   Communication: Expressive difficulties;Other (comment)(See also SLP Evaluation)  Cognition Arousal/Alertness: Awake/alert Behavior During Therapy: WFL for tasks assessed/performed Overall Cognitive Status: Difficult  to assess                                 General Comments: Answers yes/no questions correclty about 50% of time; makes attempts to say his name, lots of difficulty      General Comments General comments (skin integrity, edema, etc.): VSS    Exercises     Assessment/Plan    PT Assessment Patient needs continued PT services  PT  Problem List Decreased strength;Decreased range of motion;Decreased activity tolerance;Decreased balance;Decreased mobility;Decreased coordination;Decreased cognition;Decreased knowledge of use of DME;Decreased safety awareness;Decreased knowledge of precautions;Impaired tone       PT Treatment Interventions DME instruction;Gait training;Functional mobility training;Therapeutic activities;Therapeutic exercise;Balance training;Neuromuscular re-education;Cognitive remediation;Patient/family education    PT Goals (Current goals can be found in the Care Plan section)  Acute Rehab PT Goals Patient Stated Goal: none stated PT Goal Formulation: Patient unable to participate in goal setting Time For Goal Achievement: 02/10/18 Potential to Achieve Goals: Good    Frequency Min 4X/week   Barriers to discharge        Co-evaluation PT/OT/SLP Co-Evaluation/Treatment: Yes Reason for Co-Treatment: Complexity of the patient's impairments (multi-system involvement);Necessary to address cognition/behavior during functional activity;For patient/therapist safety;To address functional/ADL transfers PT goals addressed during session: Mobility/safety with mobility OT goals addressed during session: ADL's and self-care;Proper use of Adaptive equipment and DME;Strengthening/ROM       AM-PAC PT "6 Clicks" Daily Activity  Outcome Measure Difficulty turning over in bed (including adjusting bedclothes, sheets and blankets)?: Unable Difficulty moving from lying on back to sitting on the side of the bed? : Unable Difficulty sitting down on and standing up from a chair with arms (e.g., wheelchair, bedside commode, etc,.)?: Unable Help needed moving to and from a bed to chair (including a wheelchair)?: A Lot Help needed walking in hospital room?: Total Help needed climbing 3-5 steps with a railing? 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

## 2018-01-27 NOTE — Evaluation (Signed)
Clinical/Bedside Swallow Evaluation Patient Details  Name: Adam Yang MRN: [id number] Date of Birth: [birth year]  Today's Date: 01/27/2018 Time: SLP Start Time (ACUTE ONLY): 1100 SLP Stop Time (ACUTE ONLY): 1120 SLP Time Calculation (min) (ACUTE ONLY): 20 min  Past Medical History:  Past Medical History:  Diagnosis Date  . Anxiety    Claustrophobia  . Arthritis   . Cancer (HCC)    nasopharynx  . Chronic cough    EXCESS MUCUS BUILDUP SINCE ORAL SURGERY FROM CANCER   . Chronic kidney disease    Renal Insuffiency , Creatinine has been in the 2- 3 range, 11/27/14 Creatine 1.4  . Diabetes mellitus without complication ([place])    Type II  . Gout   . History of radiation therapy 12/23/2014- 02/10/2015   Nasopharynx / Neck Adenopathy  . Hyperlipidemia   . Hypertension   . Inguinal hernia   . Right inguinal hernia 08/23/2017   Past Surgical History:  Past Surgical History:  Procedure Laterality Date  . colonoscopy    . COLONOSCOPY W/ POLYPECTOMY    . INGUINAL HERNIA REPAIR Right 08/23/2017   Procedure: OPEN REPAIR RIGHT INGUINAL HERNIA WITH MESH;  Surgeon: Fanny Skates, MD;  Location: [facility];  Service: General;  Laterality: Right;  GENERAL AND TAP BLOCK  . INSERTION OF MESH Right 08/23/2017   Procedure: INSERTION OF MESH;  Surgeon: Fanny Skates, MD;  Location: [facility];  Service: General;  Laterality: Right;  GENERAL AND TAP BLOCK  . KNEE SURGERY Left 1990's   tibia crushed, pinned and bone grafted  . LYMPH NODE BIOPSY    . MULTIPLE EXTRACTIONS WITH ALVEOLOPLASTY N/A 12/06/2014   Procedure: Extraction of tooth #'s 2,4,5,6,7,8,9,10,11,12,13,14,15, 20,22,26,27,28,29 with alveoloplasty.;  Surgeon: Lenn Cal, DDS;  Location: [facility];  Service: Oral Surgery;  Laterality: N/A;   HPI:  [name] is a 72 y.o. male with history of hypertension, hyperlipidemia, nasopharyngeal cancer status post radiation, diabetes mellitus, chronic kidney disease, anxiety and claustrophobia  presenting right-sided flaccidity visual deficits, and bradycardia. 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 Unchanged 4 mm rightward midline shift.  Esophagram on 01/27/18 showed normal pharyngeal function, tertiary contractions consistent with esophageal dysmotility.    Assessment / Plan / Recommendation Clinical Impression  Pt demonstrates signs concerning for significant dysphagia. Pt has a history of nasopharyngeal cancer with radiation. Decreased mass of tongue and neck muscualture noted, generalized lingual weakness as well as new right oral neuromuscular weakness observed. Minimal trials of ice and a teaspoon of water result in immediate throat clearing before the swallow, delayed swallow initiation, decreased movement of hyolaryngeal complex subjectively. Strong concern for aspiration. WIll proceed with objective testing to determine best method for nutrition.  SLP Visit Diagnosis: Dysphagia, oropharyngeal phase (R13.12)    Aspiration Risk  Severe aspiration risk    Diet Recommendation          Other  Recommendations Oral Care Recommendations: Oral care QID   Follow up Recommendations        Frequency and Duration            Prognosis        Swallow Study   General HPI: [name] is a 72 y.o. male with history of hypertension, hyperlipidemia, nasopharyngeal cancer status post radiation, diabetes mellitus, chronic kidney disease, anxiety and claustrophobia presenting right-sided flaccidity visual deficits, and bradycardia. 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 Unchanged 4 mm rightward midline shift.  Esophagram on 01/27/18 showed normal  pharyngeal function, tertiary contractions consistent with esophageal dysmotility.  Type of Study: Bedside Swallow Evaluation Previous Swallow Assessment: Esophagram Temperature Spikes Noted: No Respiratory Status: Nasal cannula History of Recent Intubation:  No Behavior/Cognition: Alert;Cooperative;Requires cueing Oral Cavity Assessment: Dry Oral Care Completed by SLP: No Oral Cavity - Dentition: Edentulous Vision: Functional for self-feeding Self-Feeding Abilities: Needs assist Patient Positioning: Upright in chair Baseline Vocal Quality: Low vocal intensity Volitional Cough: Weak Volitional Swallow: Able to elicit    Oral/Motor/Sensory Function Overall Oral Motor/Sensory Function: Moderate impairment Facial ROM: Reduced left;Suspected CN VII (facial) dysfunction Facial Symmetry: Abnormal symmetry left;Suspected CN VII (facial) dysfunction Facial Strength: Reduced left;Suspected CN VII (facial) dysfunction Lingual ROM: Reduced right;Reduced left Lingual Strength: Reduced   Ice Chips Ice chips: Impaired Presentation: Spoon Oral Phase Functional Implications: Prolonged oral transit Pharyngeal Phase Impairments: Suspected delayed Swallow;Decreased hyoid-laryngeal movement;Multiple swallows;Wet Vocal Quality;Throat Clearing - Immediate   Thin Liquid Thin Liquid: Impaired Presentation: Spoon Pharyngeal  Phase Impairments: Suspected delayed Swallow;Decreased hyoid-laryngeal movement;Multiple swallows;Wet Vocal Quality;Throat Clearing - Immediate    Nectar Thick Nectar Thick Liquid: Not tested   Honey Thick Honey Thick Liquid: Not tested   Puree Puree: Not tested   Solid   GO   Solid: Not tested        Lynann Beaver 01/27/2018,1:24 PM

## 2018-01-27 NOTE — Progress Notes (Signed)
  Echocardiogram 2D Echocardiogram has been performed.  Adam Yang F 01/27/2018, 3:53 PM

## 2018-01-27 NOTE — Progress Notes (Signed)
Modified Barium Swallow Progress Note  Patient Details  Name: Adam Yang MRN: [id number] Date of Birth: Dec 01, 1945  Today's Date: 01/27/2018  Modified Barium Swallow completed.  Full report located under Chart Review in the Imaging Section.  Brief recommendations include the following:  Clinical Impression  MBS limited due to severity of dysphagia, quantity of aspiration and absent/delayed cough response. There is severe oral dysphagia with limited lingual propulsion and passive spill to pharynx with posterior head tilt. Swallow initiation delayed to the pyriform sinuses with reduced laryngeal elevation, hyoid excursion and base of tongue retraction. This limited mobility does not open the UES, and the majority of the bolus is aspirated, pts response is gentle throat clearing that does not moblize aspirate. Attempted a second bolus with max tactile cues for a chin tuck, though only a slight anterior tilt was achieved, which allowed a litle more time to initaite swallow and slight opening of UES occurred but majority of bolus still aspirated.   Pt may have been compensating for post radiation changes prior to CVA, but swallow mechanism now profoundly impaired with signs of neuromuscular weakness as well as radiation induced fibrosis. Pt is not expected to make significant improvement over the next few days given than he was already alert and participatory. May need to initiate conversation regarding long term plan for nutrition with wife based on her understanding of pts wishes.    Swallow Evaluation Recommendations       SLP Diet Recommendations: NPO;Alternative means - temporary;Alternative means - long-term       Medication Administration: Via alternative means                       [employer], MA CCC-SLP [phone]  Lynann Beaver 01/27/2018,1:54 PM

## 2018-01-27 NOTE — Progress Notes (Signed)
STROKE TEAM PROGRESS NOTE   SUBJECTIVE (INTERVAL HISTORY) His wife is at the bedside.  Pt awake alert but continue to have global aphasia, left gaze and right hemiplegia. Pt has baseline dysphagia, pending speech this time. NPO for now. Will start IVF.   Hx of nasopharyngeal cancer s/p radiation but no recurrence for 3 years. Had recent CKD and following with nephrology. On BP meds at home and wife stated BP controlled well at home.   OBJECTIVE Temp:  [97.4 F (36.3 C)-98.9 F (37.2 C)] 98 F (36.7 C) (07/19 0800) Pulse Rate:  [64-101] 88 (07/19 0700) Cardiac Rhythm: Normal sinus rhythm (07/19 0400) Resp:  [11-23] 22 (07/19 0700) BP: (96-194)/(50-99) 127/57 (07/19 0700) SpO2:  [94 %-100 %] 98 % (07/19 0700) Weight:  [137 lb 12.6 oz (62.5 kg)-138 lb 3.7 oz (62.7 kg)] 138 lb 3.7 oz (62.7 kg) (07/18 1220)  CBC:  Recent Labs  Lab 01/26/18 0913 01/26/18 0919  WBC 5.8  --   NEUTROABS 2.8  --   HGB 12.4* 12.2*  HCT 39.6 36.0*  MCV 82.2  --   PLT 324  --     Basic Metabolic Panel:  Recent Labs  Lab 01/26/18 0913 01/26/18 0919  NA 140 140  K 4.0 4.0  CL 107 108  CO2 23  --   GLUCOSE 122* 120*  BUN 36* 38*  CREATININE 2.50* 2.80*  CALCIUM 9.7  --     Lipid Panel:     Component Value Date/Time   TRIG 348 (H) 01/27/2018 0459   HgbA1c:  Lab Results  Component Value Date   HGBA1C 6.2 (H) 08/19/2017   Urine Drug Screen:     Component Value Date/Time   LABOPIA NONE DETECTED 01/26/2018 1227   COCAINSCRNUR NONE DETECTED 01/26/2018 1227   LABBENZ NONE DETECTED 01/26/2018 1227   AMPHETMU NONE DETECTED 01/26/2018 1227   THCU NONE DETECTED 01/26/2018 1227   LABBARB (A) 01/26/2018 1227    Result not available. Reagent lot number recalled by manufacturer.    Alcohol Level     Component Value Date/Time   ETH <10 01/26/2018 0913    IMAGING I have personally reviewed the radiological images below and agree with the radiology interpretations.  Ct Angio Head and neck W  Or Wo Contrast 01/26/2018 IMPRESSION:  1. No acute or focal vascular lesion to explain the patient's hemorrhage.  2. No definite enhancement or mass lesion.  3. Atherosclerotic changes involving the aortic arch and bilateral carotid bifurcations without a significant stenosis relative to the more distal vessels.  4. Cavernous atherosclerotic changes are much more mild, also without a significant stenosis.  5. Left MCA branch vessels are displaced by the hemorrhagic mass.   Ct Head Wo Contrast 01/26/2018 IMPRESSION:  Unchanged size of large intraparenchymal hemorrhage centered in the left frontal white matter.  Unchanged 4 mm rightward midline shift.   Ct Head Code Stroke Wo Contrast 01/26/2018 IMPRESSION:  1. 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  2. Screening vasogenic edema and mass effect with 4 mm of midline shift and effacement of the left lateral ventricle.  3. Mild generalized white matter disease likely reflects the sequela of chronic microvascular ischemia   Transthoracic Echocardiogram - pending 00/00/00  CT repeat pending   PHYSICAL EXAM  Temp:  [97.4 F (36.3 C)-98.9 F (37.2 C)] 98 F (36.7 C) (07/19 0800) Pulse Rate:  [64-101] 88 (07/19 0700) Resp:  [11-23] 22 (07/19 0700) BP: (96-173)/(50-99)  127/57 (07/19 0700) SpO2:  [94 %-100 %] 98 % (07/19 0700) Weight:  [138 lb 3.7 oz (62.7 kg)] 138 lb 3.7 oz (62.7 kg) (07/18 1220)  General - Well nourished, well developed, in no apparent distress.  Ophthalmologic - fundi not visualized due to noncooperation.  Cardiovascular - Regular rate and rhythm.  Neuro - awake alert, not following commands, no speech output. Global aphasia. Left gaze preference but able to cross midline to right intermittently. Blinking to visual threat on the left, but inconsistent to the right. PERRL. Right nasolabial fold flattening, tongue midline in mouth. LUE and LLE 4/5, spontaneous movement, but RUE and  RLE flaccid even with pain stimulation. DTR 1+ and right babinski positive. Sensation, coordination and gait not tested.    ASSESSMENT/PLAN Adam Yang is a 72 y.o. male with history of hypertension, hyperlipidemia, nasopharyngeal cancer status post radiation, diabetes mellitus, chronic kidney disease, anxiety and claustrophobia presenting right-sided flaccidity visual deficits, and bradycardia. He did not receive IV t-PA due to [place].  ICH: left hemisphere large hematoma, likely due to hypertension. 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 4 mm rightward midline shift.   MRI head - not performed secondary to bradycardia.  CTA H&N - no source of bleeding identified. No enhancement  CT repeat showed stable hematoma  Repeat CT in am  2D Echo - pending  LDL - 111  HgbA1c - 6.2  VTE prophylaxis - SCDs Diet Order           Diet NPO time specified  Diet effective now          No antithrombotic prior to admission, now on No antithrombotic.  Ongoing aggressive stroke risk factor management  Therapy recommendations:  pending  Disposition:  Pending  Hypertension  Stable on Cleviprex . SBP goal < 140 mmHG . Wean off cleviprex as able . Will start PO BP meds once po access . Long-term BP goal normotensive  Hyperlipidemia  Lipid lowering medication PTA: Fenofibrate  LDL 111, goal < 70  Current lipid lowering medication: none  Consider statin once po access  Dysphagia   Baseline dysphagia due to radiation in the area  Pending speech  On IVF @ 21  Consider cortrak if not passing swallow  Other Stroke Risk Factors  Advanced age  Former cigarette smoker - quit  Other Active Problems  CKD - nephrology follow as outpt Cre 2.50->2.80->2.64 - continue gentle hydration  Nasopharyngeal cancer s/p radiation - following with ENT - last seen 08/2017 -  no recurrent for 3 years.  Hospital day # 1  This patient is critically ill due to large left ICH, CKD, hypertensive emergency, dysphagia and at significant risk of neurological worsening, death form recurrent bleeding, hypertensive encephalopathy, aspiration. This patient's care requires constant monitoring of vital signs, hemodynamics, respiratory and cardiac monitoring, review of multiple databases, neurological assessment, discussion with family, other specialists and medical decision making of high complexity. I spent 40 minutes of neurocritical care time in the care of this patient. I had long discussion with wife at bedside, updated pt current condition, treatment plan and potential prognosis. She expressed understanding and appreciation.   Rosalin Hawking, MD PhD Stroke Neurology 01/27/2018 10:33 AM   To contact Stroke Continuity provider, please refer to [email or web address] After hours, contact General Neurology

## 2018-01-27 NOTE — Progress Notes (Signed)
PT Cancellation Note  Patient Details Name: Adam Yang MRN: [id number] DOB: 1945-11-03   Cancelled Treatment:    Reason Eval/Treat Not Completed: Active bedrest order   Need increased activity orders in order to proceed with PT eval/mobilization.  Roney Marion, [place]  Acute Rehabilitation Services Pager [phone] Office [phone]    Colletta Maryland 01/27/2018, 8:11 AM

## 2018-01-27 NOTE — Progress Notes (Signed)
Cortrak Tube Team Note:  Consult received to place a Cortrak feeding tube.   A 10 F Cortrak tube was placed in the LEFT nare and secured with a nasal bridle at 70 cm. Per the Cortrak monitor reading the tube tip is gastric.   No x-ray is required. RN may begin using tube.   If the tube becomes dislodged please keep the tube and contact the Cortrak team at [email or web address] (password [email or web address]) for replacement.  If after hours and replacement cannot be delayed, place a NG tube and confirm placement with an abdominal x-ray.    Mariana Single RD, LDN Clinical Nutrition Pager # [phone]

## 2018-01-27 NOTE — Progress Notes (Signed)
Initial Nutrition Assessment  DOCUMENTATION CODES:   Not applicable  INTERVENTION:   Initiate enteral nutrition support via Cortrak  Osmolite 1.2 @ 30 ml/hr and increase by 10 ml every 12 hours to goal rate of 60 ml/hr (1440 ml/day) 30 ml Prostat daily MVI daily  Provides: 1828 kcal, 95 grams protein, and 1167 ml free water.   Monitor magnesium and phosphorus every 12 hours x 4 occurances, MD to replete as needed, as pt is at risk for refeeding syndrome given moderate malnutrition.  NUTRITION DIAGNOSIS:   Moderate Malnutrition related to chronic illness(known cancer hx with dysphagia ) as evidenced by moderate muscle depletion, moderate fat depletion.  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    Pt discussed during ICU rounds and with RN.  Pt failed MBS this afternoon and had Cortrak tube placed Per RN now that Cortrak is placed will start PO BP meds and wean off Cleviprex  Unable to obtain nutrition hx. No family present. Per chart review pt was 145 lb 2/19 and 154 lb 8/18.  Per RN wife was pureeing food at home PTA but unsure of intake.   Medications reviewed and include: senokot Cleviprex @ 24 ml/hr = 1152 kcal  Labs reviewed: BUN/Cr: 40/2.64, TG: 357, hgbA1C: 6    NUTRITION - FOCUSED PHYSICAL EXAM:    Most Recent Value  Orbital Region  Moderate depletion  Upper Arm Region  Mild depletion  Thoracic and Lumbar Region  Severe depletion  Buccal Region  Moderate depletion  Temple Region  Moderate depletion  Clavicle Bone Region  Severe depletion  Clavicle and Acromion Bone Region  Severe depletion   Scapular Bone Region  Unable to assess  Dorsal Hand  No depletion  Patellar Region  No depletion  Anterior Thigh Region  -- [R thigh WNL, L thigh with moderate depeltions]  Posterior Calf Region  -- [R calf WNL, L calf mild depletion]  Edema (RD Assessment)  None  Hair  Reviewed  Eyes  Reviewed  Mouth  Reviewed [no teeth]  Skin  Reviewed  Nails  Reviewed       Diet Order:   Diet Order           Diet NPO time specified  Diet effective now          EDUCATION NEEDS:   No education needs have been identified at this time  Skin:  Skin Assessment: Reviewed RN Assessment  Last BM:  unknown  Height:   Ht Readings from Last 1 Encounters:  01/26/18 5\' 9"  (1.753 m)    Weight:   Wt Readings from Last 1 Encounters:  01/26/18 138 lb 3.7 oz (62.7 kg)    Ideal Body Weight:  72.7 kg  BMI:  Body mass index is 20.41 kg/m.  Estimated Nutritional Needs:   Kcal:  1800-2000  Protein:  90-100 grams  Fluid:  >1.8 L/day  Maylon Peppers RD, LDN, CNSC [phone] Pager [phone] After Hours Pager

## 2018-01-27 NOTE — Progress Notes (Signed)
Inpatient Rehabilitation  Per PT and OT request, patient was screened by Phillipe Clemon for appropriateness for an Inpatient Acute Rehab consult.  At this time we are recommending an Inpatient Rehab consult.  Please order if you are agreeable.    Emeterio Balke, M.A., CCC/SLP Admission Coordinator  [facility]  Cell [phone]  

## 2018-01-28 ENCOUNTER — Inpatient Hospital Stay (HOSPITAL_COMMUNITY): Payer: Medicare Other

## 2018-01-28 DIAGNOSIS — G936 Cerebral edema: Secondary | ICD-10-CM | POA: Diagnosis not present

## 2018-01-28 DIAGNOSIS — G935 Compression of brain: Secondary | ICD-10-CM | POA: Diagnosis not present

## 2018-01-28 DIAGNOSIS — I611 Nontraumatic intracerebral hemorrhage in hemisphere, cortical: Secondary | ICD-10-CM

## 2018-01-28 LAB — CBC
HEMATOCRIT: 35 % — AB (ref 39.0–52.0)
HEMOGLOBIN: 11.2 g/dL — AB (ref 13.0–17.0)
MCH: 26.2 pg (ref 26.0–34.0)
MCHC: 32 g/dL (ref 30.0–36.0)
MCV: 82 fL (ref 78.0–100.0)
Platelets: 339 10*3/uL (ref 150–400)
RBC: 4.27 MIL/uL (ref 4.22–5.81)
RDW: 14.9 % (ref 11.5–15.5)
WBC: 12.2 10*3/uL — AB (ref 4.0–10.5)

## 2018-01-28 LAB — BASIC METABOLIC PANEL
ANION GAP: 11 (ref 5–15)
BUN: 51 mg/dL — ABNORMAL HIGH (ref 8–23)
CHLORIDE: 107 mmol/L (ref 98–111)
CO2: 24 mmol/L (ref 22–32)
Calcium: 9.6 mg/dL (ref 8.9–10.3)
Creatinine, Ser: 2.85 mg/dL — ABNORMAL HIGH (ref 0.61–1.24)
GFR calc non Af Amer: 21 mL/min — ABNORMAL LOW (ref >60)
GFR, EST AFRICAN AMERICAN: 24 mL/min — AB (ref >60)
Glucose, Bld: 150 mg/dL — ABNORMAL HIGH (ref 70–99)
Potassium: 3.9 mmol/L (ref 3.5–5.1)
SODIUM: 142 mmol/L (ref 135–145)

## 2018-01-28 LAB — GLUCOSE, CAPILLARY
GLUCOSE-CAPILLARY: 163 mg/dL — AB (ref 70–99)
Glucose-Capillary: 141 mg/dL — ABNORMAL HIGH (ref 70–99)
Glucose-Capillary: 157 mg/dL — ABNORMAL HIGH (ref 70–99)
Glucose-Capillary: 162 mg/dL — ABNORMAL HIGH (ref 70–99)
Glucose-Capillary: 173 mg/dL — ABNORMAL HIGH (ref 70–99)

## 2018-01-28 MED ORDER — NYSTATIN 100000 UNIT/ML MT SUSP
5.0000 mL | Freq: Four times a day (QID) | OROMUCOSAL | Status: DC
  Administered 2018-01-28 – 2018-02-02 (×18): 500000 [IU] via OROMUCOSAL
  Filled 2018-01-28 (×18): qty 5

## 2018-01-28 MED ORDER — ORAL CARE MOUTH RINSE
15.0000 mL | OROMUCOSAL | Status: DC
  Administered 2018-01-29 – 2018-02-02 (×46): 15 mL via OROMUCOSAL

## 2018-01-28 MED ORDER — LABETALOL HCL 5 MG/ML IV SOLN
10.0000 mg | INTRAVENOUS | Status: DC | PRN
  Administered 2018-01-28: 10 mg via INTRAVENOUS
  Filled 2018-01-28: qty 4

## 2018-01-28 NOTE — Plan of Care (Signed)
Patient is stable, able to follow commands, right sided weakness and expressive aphasia still present.

## 2018-01-28 NOTE — Progress Notes (Signed)
Paged MD regarding white coating on tongue and mouth, order received for nystatin mouth wash and every 2 hour oral care.

## 2018-01-28 NOTE — Progress Notes (Signed)
STROKE TEAM PROGRESS NOTE   SUBJECTIVE (INTERVAL HISTORY) No family members present today during rounds but his wife, daughter and son-in-law showed up later.  The patient's Cleviprex is being weaned.  His home antihypertensive medications will be resumed.  The patient was able to follow some commands.  He has been started on a dysphagia diet along with this tube feedings.  We will change the tube feedings to evenings only in order to increase the patient's appetite during the day if patient passes swallowing evaluation. Repeat CT scan of the head this morning shows minimum increase in left to right midline shift with slight intraventricular blood with no significant hydrocephalus OBJECTIVE Temp:  [98.1 F (36.7 C)-98.9 F (37.2 C)] 98.5 F (36.9 C) (07/20 1200) Pulse Rate:  [77-106] 88 (07/20 1200) Cardiac Rhythm: Normal sinus rhythm (07/20 0800) Resp:  [14-24] 17 (07/20 1200) BP: (106-156)/(54-85) 150/85 (07/20 1200) SpO2:  [94 %-100 %] 98 % (07/20 1200) Weight:  [138 lb 0.1 oz (62.6 kg)] 138 lb 0.1 oz (62.6 kg) (07/20 0500)  CBC:  Recent Labs  Lab 01/26/18 0913  01/27/18 0459 01/28/18 0247  WBC 5.8  --  11.0* 12.2*  NEUTROABS 2.8  --   --   --   HGB 12.4*   < > 12.2* 11.2*  HCT 39.6   < > 37.8* 35.0*  MCV 82.2  --  81.6 82.0  PLT 324  --  403* 339   < > = values in this interval not displayed.    Basic Metabolic Panel:  Recent Labs  Lab 01/27/18 0459 01/27/18 1739 01/28/18 0247  NA 139  --  142  K 4.1  --  3.9  CL 104  --  107  CO2 22  --  24  GLUCOSE 155*  --  150*  BUN 40*  --  51*  CREATININE 2.64*  --  2.85*  CALCIUM 9.7  --  9.6  MG 2.4 2.9*  --   PHOS 4.7* 5.0*  --     Lipid Panel:     Component Value Date/Time   CHOL 231 (H) 01/27/2018 0459   TRIG 348 (H) 01/27/2018 0459   TRIG 357 (H) 01/27/2018 0459   HDL 49 01/27/2018 0459   CHOLHDL 4.7 01/27/2018 0459   VLDL 71 (H) 01/27/2018 0459   LDLCALC 111 (H) 01/27/2018 0459   HgbA1c:  Lab Results   Component Value Date   HGBA1C 6.0 (H) 01/27/2018   Urine Drug Screen:     Component Value Date/Time   LABOPIA NONE DETECTED 01/26/2018 1227   COCAINSCRNUR NONE DETECTED 01/26/2018 1227   LABBENZ NONE DETECTED 01/26/2018 1227   AMPHETMU NONE DETECTED 01/26/2018 1227   THCU NONE DETECTED 01/26/2018 1227   LABBARB (A) 01/26/2018 1227    Result not available. Reagent lot number recalled by manufacturer.    Alcohol Level     Component Value Date/Time   ETH <10 01/26/2018 0913    IMAGING I have personally reviewed the radiological images below and agree with the radiology interpretations.  Ct Angio Head and neck W Or Wo Contrast 01/26/2018 IMPRESSION:  1. No acute or focal vascular lesion to explain the patient's hemorrhage.  2. No definite enhancement or mass lesion.  3. Atherosclerotic changes involving the aortic arch and bilateral carotid bifurcations without a significant stenosis relative to the more distal vessels.  4. Cavernous atherosclerotic changes are much more mild, also without a significant stenosis.  5. Left MCA branch vessels are displaced by  the hemorrhagic mass.   Ct Head Wo Contrast 01/26/2018 IMPRESSION:  Unchanged size of large intraparenchymal hemorrhage centered in the left frontal white matter.  Unchanged 4 mm rightward midline shift.   Ct Head Code Stroke Wo Contrast 01/26/2018 IMPRESSION:  1. 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  2. Screening vasogenic edema and mass effect with 4 mm of midline shift and effacement of the left lateral ventricle.  3. Mild generalized white matter disease likely reflects the sequela of chronic microvascular ischemia   Transthoracic Echocardiogram  01/27/18 Study Conclusions  - Left ventricle: The cavity size was normal. Systolic function was   vigorous. The estimated ejection fraction was in the range of 65%   to 70%. Wall motion was normal; there were no regional wall    motion abnormalities. Left ventricular diastolic function   parameters were normal. - Aortic valve: Trileaflet; moderately thickened, moderately   calcified leaflets. Transvalvular velocity was within the normal   range. There was no stenosis. There was no regurgitation. - Mitral valve: There was no regurgitation. - Right ventricle: The cavity size was normal. Wall thickness was   normal. Systolic function was normal. - Tricuspid valve: There was no regurgitation. - Pulmonary arteries: Systolic pressure could not be accurately   estimated.  Impressions:  - Limited study quality, hyperdynamic LVEF, aortic valve leaflets   are calcified, no stenosis, no source of embolism was seen.  CT Head Wo Contrast 01/28/2018 IMPRESSION: 1. No significant interval change in size and distribution of large intraparenchymal hemorrhage centered at the left frontal white matter. Similar localized regional mass effect with persistent 4 mm left-to-right shift. 2. Interval development of small volume intraventricular hemorrhage. No hydrocephalus or ventricular trapping. 3. No other new acute intracranial abnormality.   PHYSICAL EXAM  Temp:  [98.1 F (36.7 C)-98.9 F (37.2 C)] 98.5 F (36.9 C) (07/20 1200) Pulse Rate:  [77-106] 88 (07/20 1200) Resp:  [14-24] 17 (07/20 1200) BP: (106-156)/(54-85) 150/85 (07/20 1200) SpO2:  [94 %-100 %] 98 % (07/20 1200) Weight:  [138 lb 0.1 oz (62.6 kg)] 138 lb 0.1 oz (62.6 kg) (07/20 0500)  General - frail elderly Caucasian male in no apparent distress.  Ophthalmologic - fundi not visualized due to noncooperation.  Cardiovascular - Regular rate and rhythm.  Neuro - drowsy but can be aroused.   following simple midline and left bodycommands, minimal speech output.and speaks only a few words. Marked expressive greater than receptive Global aphasia. Left gaze preference but able to cross midline to right intermittently. Blinking to visual threat on the left, but  inconsistent to the right. PERRL. Right nasolabial fold flattening, tongue midline in mouth. LUE and LLE 4/5, spontaneous movement, but RUE and RLE flaccid even with pain stimulation. DTR 1+ and right babinski positive. Sensation, coordination and gait not tested.    ASSESSMENT/PLAN Mr. Loran Fleet is a 72 y.o. male with history of hypertension, hyperlipidemia, nasopharyngeal cancer status post radiation, diabetes mellitus, chronic kidney disease, anxiety and claustrophobia presenting right-sided flaccidity visual deficits, and bradycardia. He did not receive IV t-PA due to [place].  ICH: left hemisphere large hematoma, likely due to hypertension. 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 4 mm rightward midline shift.   MRI head - not performed secondary to bradycardia.  CTA H&N - no source of bleeding identified. No enhancement  CT  repeat showed stable hematoma  Repeat CT - no significant change other than interval development of small volume intraventricular hemorrhage.  2D Echo - EF 65 to 70%.  No cardiac source of emboli identified.  LDL - 111  HgbA1c - 6.2  VTE prophylaxis - SCDs Diet Order           Diet NPO time specified  Diet effective now          No antithrombotic prior to admission, now on No antithrombotic.  Ongoing aggressive stroke risk factor management  Therapy recommendations:  pending  Disposition:  Pending  Hypertension  Stable on Cleviprex . SBP goal < 140 mmHG . Wean off cleviprex as able . Will start PO BP meds once po access . Long-term BP goal normotensive  Hyperlipidemia  Lipid lowering medication PTA: Fenofibrate  LDL 111, goal < 70  Current lipid lowering medication: none  Consider statin once po access  Dysphagia   Baseline dysphagia due to radiation in the area  Pending speech  On IVF @ 52  Consider cortrak if not  passing swallow  Other Stroke Risk Factors  Advanced age  Former cigarette smoker - quit  Other Active Problems  CKD - nephrology follow as outpt Cre 2.50->2.80->2.64 -> 2.85 continue gentle hydration  Nasopharyngeal cancer s/p radiation - following with ENT - last seen 08/2017 - no recurrent for 3 years.  Leukocytosis - WBCs 12.2 - monitor - afebrile  PLAN  Wean Cleviprex - Norvasc restated yesterday. (avoid ACE I and AARB with CKD)  Speech evaluating. Change TF to evenings only if pt passes swallow. May need cortrak.  Monitor renal function   Hospital day # 2  Mikey Bussing PA-C [facility] Pager [phone] 01/28/2018, 12:53 PM I have personally examined this patient, reviewed notes, independently viewed imaging studies, participated in medical decision making and plan of care.ROS completed by me personally and pertinent positives fully documented  I have made any additions or clarifications directly to the above note. Agree with note above.  The patient remains neurologically stable despite minimum change in the CT scan cerebral edema. Recommend conservative follow-up for now but if there is neurological worsening may need to consider intubation and starting hypertonic saline. I had a long discussion with the patient's wife and daughter at the bedside and answered questions about his care. May consider switching out to the neurology floor bed tomorrow if he remains stable.This patient is critically ill and at significant risk of neurological worsening, death and care requires constant monitoring of vital signs, hemodynamics,respiratory and cardiac monitoring, extensive review of multiple databases, frequent neurological assessment, discussion with family, other specialists and medical decision making of high complexity.I have made any additions or clarifications directly to the above note.This critical care time does not reflect procedure time, or teaching time or  supervisory time of PA/NP/Med Resident etc but could involve care discussion time.  I spent 40 minutes of neurocritical care time  in the care of  this patient.      Antony Contras, MD Medical Director [facility] Pager: [phone] 01/28/2018 2:21 PM   To contact Stroke Continuity provider, please refer to [email or web address] After hours, contact General Neurology

## 2018-01-29 ENCOUNTER — Encounter (HOSPITAL_COMMUNITY): Payer: Self-pay | Admitting: Neurology

## 2018-01-29 DIAGNOSIS — N189 Chronic kidney disease, unspecified: Secondary | ICD-10-CM | POA: Insufficient documentation

## 2018-01-29 LAB — BASIC METABOLIC PANEL
ANION GAP: 8 (ref 5–15)
BUN: 45 mg/dL — ABNORMAL HIGH (ref 8–23)
CO2: 25 mmol/L (ref 22–32)
Calcium: 9.4 mg/dL (ref 8.9–10.3)
Chloride: 112 mmol/L — ABNORMAL HIGH (ref 98–111)
Creatinine, Ser: 2.16 mg/dL — ABNORMAL HIGH (ref 0.61–1.24)
GFR calc Af Amer: 34 mL/min — ABNORMAL LOW (ref >60)
GFR, EST NON AFRICAN AMERICAN: 29 mL/min — AB (ref >60)
GLUCOSE: 187 mg/dL — AB (ref 70–99)
POTASSIUM: 4.2 mmol/L (ref 3.5–5.1)
SODIUM: 145 mmol/L (ref 135–145)

## 2018-01-29 LAB — CBC
HCT: 34.3 % — ABNORMAL LOW (ref 39.0–52.0)
Hemoglobin: 11 g/dL — ABNORMAL LOW (ref 13.0–17.0)
MCH: 26.5 pg (ref 26.0–34.0)
MCHC: 32.1 g/dL (ref 30.0–36.0)
MCV: 82.7 fL (ref 78.0–100.0)
PLATELETS: 338 10*3/uL (ref 150–400)
RBC: 4.15 MIL/uL — AB (ref 4.22–5.81)
RDW: 15.1 % (ref 11.5–15.5)
WBC: 10.6 10*3/uL — AB (ref 4.0–10.5)

## 2018-01-29 LAB — GLUCOSE, CAPILLARY
GLUCOSE-CAPILLARY: 148 mg/dL — AB (ref 70–99)
GLUCOSE-CAPILLARY: 159 mg/dL — AB (ref 70–99)
GLUCOSE-CAPILLARY: 173 mg/dL — AB (ref 70–99)
GLUCOSE-CAPILLARY: 186 mg/dL — AB (ref 70–99)
Glucose-Capillary: 180 mg/dL — ABNORMAL HIGH (ref 70–99)

## 2018-01-29 MED ORDER — HYDRALAZINE HCL 20 MG/ML IJ SOLN
20.0000 mg | Freq: Four times a day (QID) | INTRAMUSCULAR | Status: DC | PRN
  Administered 2018-02-01 (×2): 20 mg via INTRAVENOUS
  Filled 2018-01-29 (×2): qty 1

## 2018-01-29 MED ORDER — ARTIFICIAL TEARS OPHTHALMIC OINT
TOPICAL_OINTMENT | OPHTHALMIC | Status: DC | PRN
  Administered 2018-02-02: 03:00:00 via OPHTHALMIC
  Filled 2018-01-29: qty 3.5

## 2018-01-29 MED ORDER — LISINOPRIL 20 MG PO TABS
20.0000 mg | ORAL_TABLET | Freq: Every day | ORAL | Status: DC
  Administered 2018-01-29 – 2018-02-01 (×4): 20 mg via ORAL
  Filled 2018-01-29 (×4): qty 1

## 2018-01-29 MED ORDER — LABETALOL HCL 5 MG/ML IV SOLN
20.0000 mg | INTRAVENOUS | Status: DC | PRN
  Administered 2018-01-29 – 2018-01-31 (×3): 20 mg via INTRAVENOUS
  Filled 2018-01-29 (×2): qty 4

## 2018-01-29 NOTE — Plan of Care (Signed)
Patient's symptoms remain stable. BP remains under 180 with PRN medication.

## 2018-01-29 NOTE — Progress Notes (Signed)
STROKE TEAM PROGRESS NOTE   SUBJECTIVE (INTERVAL HISTORY) No family members present today   The patient'snurse is at the bedside. Cleviprex is being weaned.it was off briefly yesterday but had to be restarted due to rising blood pressure.  His home antihypertensive medications have been resumed.  The patient was able to follow some commands.  He has been started on a dysphagia diet along with this tube feedings.  His sugars have been running high.his labs look slightly improved with creatinine now down to 2.12 OBJECTIVE Temp:  [97.7 F (36.5 C)-98.8 F (37.1 C)] 98.6 F (37 C) (07/21 1200) Pulse Rate:  [78-110] 100 (07/21 1300) Cardiac Rhythm: Normal sinus rhythm (07/21 1200) Resp:  [13-26] 22 (07/21 1300) BP: (110-177)/(49-102) 163/82 (07/21 1300) SpO2:  [95 %-100 %] 99 % (07/21 1300) Weight:  [138 lb 3.7 oz (62.7 kg)] 138 lb 3.7 oz (62.7 kg) (07/21 0449)  CBC:  Recent Labs  Lab 01/26/18 0913  01/28/18 0247 01/29/18 0338  WBC 5.8   < > 12.2* 10.6*  NEUTROABS 2.8  --   --   --   HGB 12.4*   < > 11.2* 11.0*  HCT 39.6   < > 35.0* 34.3*  MCV 82.2   < > 82.0 82.7  PLT 324   < > 339 338   < > = values in this interval not displayed.    Basic Metabolic Panel:  Recent Labs  Lab 01/27/18 0459 01/27/18 1739 01/28/18 0247 01/29/18 0338  NA 139  --  142 145  K 4.1  --  3.9 4.2  CL 104  --  107 112*  CO2 22  --  24 25  GLUCOSE 155*  --  150* 187*  BUN 40*  --  51* 45*  CREATININE 2.64*  --  2.85* 2.16*  CALCIUM 9.7  --  9.6 9.4  MG 2.4 2.9*  --   --   PHOS 4.7* 5.0*  --   --     Lipid Panel:     Component Value Date/Time   CHOL 231 (H) 01/27/2018 0459   TRIG 348 (H) 01/27/2018 0459   TRIG 357 (H) 01/27/2018 0459   HDL 49 01/27/2018 0459   CHOLHDL 4.7 01/27/2018 0459   VLDL 71 (H) 01/27/2018 0459   LDLCALC 111 (H) 01/27/2018 0459   HgbA1c:  Lab Results  Component Value Date   HGBA1C 6.0 (H) 01/27/2018   Urine Drug Screen:     Component Value Date/Time   LABOPIA  NONE DETECTED 01/26/2018 1227   COCAINSCRNUR NONE DETECTED 01/26/2018 1227   LABBENZ NONE DETECTED 01/26/2018 1227   AMPHETMU NONE DETECTED 01/26/2018 1227   THCU NONE DETECTED 01/26/2018 1227   LABBARB (A) 01/26/2018 1227    Result not available. Reagent lot number recalled by manufacturer.    Alcohol Level     Component Value Date/Time   ETH <10 01/26/2018 0913    IMAGING I have personally reviewed the radiological images below and agree with the radiology interpretations.  Ct Angio Head and neck W Or Wo Contrast 01/26/2018 IMPRESSION:  1. No acute or focal vascular lesion to explain the patient's hemorrhage.  2. No definite enhancement or mass lesion.  3. Atherosclerotic changes involving the aortic arch and bilateral carotid bifurcations without a significant stenosis relative to the more distal vessels.  4. Cavernous atherosclerotic changes are much more mild, also without a significant stenosis.  5. Left MCA branch vessels are displaced by the hemorrhagic mass.   Ct  Head Wo Contrast 01/26/2018 IMPRESSION:  Unchanged size of large intraparenchymal hemorrhage centered in the left frontal white matter.  Unchanged 4 mm rightward midline shift.   Ct Head Code Stroke Wo Contrast 01/26/2018 IMPRESSION:  1. 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  2. Screening vasogenic edema and mass effect with 4 mm of midline shift and effacement of the left lateral ventricle.  3. Mild generalized white matter disease likely reflects the sequela of chronic microvascular ischemia   Transthoracic Echocardiogram  01/27/18 Study Conclusions  - Left ventricle: The cavity size was normal. Systolic function was   vigorous. The estimated ejection fraction was in the range of 65%   to 70%. Wall motion was normal; there were no regional wall   motion abnormalities. Left ventricular diastolic function   parameters were normal. - Aortic valve: Trileaflet;  moderately thickened, moderately   calcified leaflets. Transvalvular velocity was within the normal   range. There was no stenosis. There was no regurgitation. - Mitral valve: There was no regurgitation. - Right ventricle: The cavity size was normal. Wall thickness was   normal. Systolic function was normal. - Tricuspid valve: There was no regurgitation. - Pulmonary arteries: Systolic pressure could not be accurately   estimated.  Impressions:  - Limited study quality, hyperdynamic LVEF, aortic valve leaflets   are calcified, no stenosis, no source of embolism was seen.  CT Head Wo Contrast 01/28/2018 IMPRESSION: 1. No significant interval change in size and distribution of large intraparenchymal hemorrhage centered at the left frontal white matter. Similar localized regional mass effect with persistent 4 mm left-to-right shift. 2. Interval development of small volume intraventricular hemorrhage. No hydrocephalus or ventricular trapping. 3. No other new acute intracranial abnormality.   PHYSICAL EXAM  Temp:  [97.7 F (36.5 C)-98.8 F (37.1 C)] 98.6 F (37 C) (07/21 1200) Pulse Rate:  [78-110] 100 (07/21 1300) Resp:  [13-26] 22 (07/21 1300) BP: (110-177)/(49-102) 163/82 (07/21 1300) SpO2:  [95 %-100 %] 99 % (07/21 1300) Weight:  [138 lb 3.7 oz (62.7 kg)] 138 lb 3.7 oz (62.7 kg) (07/21 0449)  General - frail elderly Caucasian male in no apparent distress.  Ophthalmologic - fundi not visualized due to noncooperation.  Cardiovascular - Regular rate and rhythm.  Neuro - drowsy but can be aroused.   following simple midline and left bodycommands, minimal speech output.and speaks only a few words. Marked expressive greater than receptive Global aphasia. Left gaze preference but able to cross midline to right intermittently. Blinking to visual threat on the left, but inconsistent to the right. PERRL. Right nasolabial fold flattening, tongue midline in mouth. LUE and LLE 4/5,  spontaneous movement, but RUE and RLE flaccid even with pain stimulation. DTR 1+ and right babinski positive. Sensation, coordination and gait not tested.    ASSESSMENT/PLAN [name] is a 72 y.o. male with history of hypertension, hyperlipidemia, nasopharyngeal cancer status post radiation, diabetes mellitus, chronic kidney disease, anxiety and claustrophobia presenting right-sided flaccidity visual deficits, and bradycardia. He did not receive IV t-PA due to [place].  ICH: left hemisphere large hematoma, likely due to hypertension. 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 4 mm rightward midline shift.   MRI head - not performed secondary to bradycardia.  CTA H&N - no source of bleeding identified. No enhancement  CT repeat showed stable hematoma  Repeat  CT - no significant change other than interval development of small volume intraventricular hemorrhage.  2D Echo - EF 65 to 70%.  No cardiac source of emboli identified.  LDL - 111  HgbA1c - 6.2  VTE prophylaxis - SCDs Diet Order           Diet NPO time specified  Diet effective now          No antithrombotic prior to admission, now on No antithrombotic.  Ongoing aggressive stroke risk factor management  Therapy recommendations:  CLR Disposition:  CLR Hypertension  Stable on Cleviprex . SBP goal < 180 mmHG . Wean off cleviprex as able . Resumed home meds. Will add prn labetalol and hydralazine and wean cleviprex . Long-term BP goal normotensive  Hyperlipidemia  Lipid lowering medication PTA: Fenofibrate  LDL 111, goal < 70  Current lipid lowering medication: none  Consider statin once po access  Dysphagia   Baseline dysphagia due to radiation in the area  Pending speech  On IVF @ 52  Consider cortrak if not passing swallow  Other Stroke Risk Factors  Advanced age  Former cigarette  smoker - quit  Other Active Problems  CKD - nephrology follow as outpt Cre 2.50->2.80->2.64 -> 2.85 continue gentle hydration  Nasopharyngeal cancer s/p radiation - following with ENT - last seen 08/2017 - no recurrent for 3 years.  Leukocytosis - WBCs 12.2 - monitor - afebrile  PLAN  Wean Cleviprex - Norvasc rstarted. (avoid ACE I and AARB with CKD)  Speech evaluating. Change TF to evenings only if pt passes swallow. May need cortrak.  Monitor renal function   Hospital day # 3  I have personally examined this patient, reviewed notes, independently viewed imaging studies, participated in medical decision making and plan of care.ROS completed by me personally and pertinent positives fully documented  I have made any additions or clarifications directly to the above note.    The patient remains neurologically stable despite minimum change in the CT scan cerebral edema. Recommend conservative follow-up for now but if there is neurological worsening may need to consider intubation and starting hypertonic saline.  May consider switching out to the neurology floor bed tomorrow if he remains stable.This patient is critically ill and at significant risk of neurological worsening, death and care requires constant monitoring of vital signs, hemodynamics,respiratory and cardiac monitoring, extensive review of multiple databases, frequent neurological assessment, discussion with family, other specialists and medical decision making of high complexity.I have made any additions or clarifications directly to the above note.This critical care time does not reflect procedure time, or teaching time or supervisory time of PA/NP/Med Resident etc but could involve care discussion time.  I spent 30 minutes of neurocritical care time  in the care of  this patient.      Antony Contras, MD Medical Director [facility] Pager: [phone] 01/29/2018 1:19 PM   To contact Stroke Continuity provider,  please refer to [email or web address] After hours, contact General Neurology

## 2018-01-30 DIAGNOSIS — G935 Compression of brain: Secondary | ICD-10-CM

## 2018-01-30 DIAGNOSIS — I615 Nontraumatic intracerebral hemorrhage, intraventricular: Secondary | ICD-10-CM

## 2018-01-30 DIAGNOSIS — E119 Type 2 diabetes mellitus without complications: Secondary | ICD-10-CM

## 2018-01-30 DIAGNOSIS — N179 Acute kidney failure, unspecified: Secondary | ICD-10-CM

## 2018-01-30 DIAGNOSIS — N183 Chronic kidney disease, stage 3 (moderate): Secondary | ICD-10-CM

## 2018-01-30 DIAGNOSIS — G936 Cerebral edema: Secondary | ICD-10-CM

## 2018-01-30 DIAGNOSIS — I10 Essential (primary) hypertension: Secondary | ICD-10-CM

## 2018-01-30 DIAGNOSIS — E871 Hypo-osmolality and hyponatremia: Secondary | ICD-10-CM

## 2018-01-30 DIAGNOSIS — I629 Nontraumatic intracranial hemorrhage, unspecified: Secondary | ICD-10-CM

## 2018-01-30 DIAGNOSIS — I610 Nontraumatic intracerebral hemorrhage in hemisphere, subcortical: Principal | ICD-10-CM

## 2018-01-30 LAB — GLUCOSE, CAPILLARY
GLUCOSE-CAPILLARY: 151 mg/dL — AB (ref 70–99)
Glucose-Capillary: 146 mg/dL — ABNORMAL HIGH (ref 70–99)
Glucose-Capillary: 148 mg/dL — ABNORMAL HIGH (ref 70–99)
Glucose-Capillary: 152 mg/dL — ABNORMAL HIGH (ref 70–99)
Glucose-Capillary: 163 mg/dL — ABNORMAL HIGH (ref 70–99)
Glucose-Capillary: 180 mg/dL — ABNORMAL HIGH (ref 70–99)

## 2018-01-30 LAB — BASIC METABOLIC PANEL
Anion gap: 11 (ref 5–15)
BUN: 38 mg/dL — ABNORMAL HIGH (ref 8–23)
CHLORIDE: 111 mmol/L (ref 98–111)
CO2: 25 mmol/L (ref 22–32)
CREATININE: 1.61 mg/dL — AB (ref 0.61–1.24)
Calcium: 9.6 mg/dL (ref 8.9–10.3)
GFR calc non Af Amer: 42 mL/min — ABNORMAL LOW (ref >60)
GFR, EST AFRICAN AMERICAN: 48 mL/min — AB (ref >60)
Glucose, Bld: 145 mg/dL — ABNORMAL HIGH (ref 70–99)
Potassium: 4.3 mmol/L (ref 3.5–5.1)
Sodium: 147 mmol/L — ABNORMAL HIGH (ref 135–145)

## 2018-01-30 LAB — CBC
HCT: 42.4 % (ref 39.0–52.0)
HEMOGLOBIN: 13.5 g/dL (ref 13.0–17.0)
MCH: 26 pg (ref 26.0–34.0)
MCHC: 31.8 g/dL (ref 30.0–36.0)
MCV: 81.5 fL (ref 78.0–100.0)
Platelets: 179 10*3/uL (ref 150–400)
RBC: 5.2 MIL/uL (ref 4.22–5.81)
RDW: 14.8 % (ref 11.5–15.5)
WBC: 8.5 10*3/uL (ref 4.0–10.5)

## 2018-01-30 LAB — TRIGLYCERIDES: TRIGLYCERIDES: 142 mg/dL (ref <150)

## 2018-01-30 MED ORDER — JEVITY 1.2 CAL PO LIQD
1000.0000 mL | ORAL | Status: DC
  Administered 2018-01-30 – 2018-02-02 (×3): 1000 mL
  Filled 2018-01-30 (×5): qty 1000

## 2018-01-30 MED ORDER — TIZANIDINE HCL 4 MG PO TABS
4.0000 mg | ORAL_TABLET | Freq: Every day | ORAL | Status: DC
  Administered 2018-01-30 – 2018-02-01 (×3): 4 mg via ORAL
  Filled 2018-01-30 (×3): qty 1

## 2018-01-30 NOTE — Progress Notes (Signed)
Nutrition Follow-up  DOCUMENTATION CODES:   Not applicable  INTERVENTION:   D/C Osmolite 1.2  Jevity 1.2 @ 60 ml/hr via Cortrak 30 ml Prostat daily  Provides: 1828 kcal, 95 grams protein, and 1167 ml free water.   Recommend hyperglycemia protocol and Diabetes Coordinator consult if blood sugars remain elevated  NUTRITION DIAGNOSIS:   Moderate Malnutrition related to chronic illness(known cancer hx with dysphagia ) as evidenced by moderate muscle depletion, moderate fat depletion. Ongoing.   GOAL:   Patient will meet greater than or equal to 90% of their needs Met.   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    Pt discussed during ICU rounds and with RN.  Pt still too weak to participate in swallow eval Consult received due to elevated blood sugars, pt on no medications to manage blood sugars  Pt being transferred to 3W currently  Medications reviewed and include: senokot Cleviprex off Labs reviewed: Na 147 (H) CBG (last 3)  Recent Labs    01/30/18 0332 01/30/18 0824 01/30/18 1201  GLUCAP 151* 152* 146*   TF via Cortrak Osmolite 1.2 @ 60 ml/hr (1440 ml/day) 30 ml Prostat daily MVI daily  Provides: 1828 kcal, 95 grams protein, and 1167 ml free water.    Diet Order:   Diet Order           Diet NPO time specified  Diet effective now          EDUCATION NEEDS:   No education needs have been identified at this time  Skin:  Skin Assessment: Reviewed RN Assessment  Last BM:  unknown  Height:   Ht Readings from Last 1 Encounters:  01/26/18 _0  (1.753 m)    Weight:   Wt Readings from Last 1 Encounters:  01/30/18 139 lb 5.3 oz (63.2 kg)    Ideal Body Weight:  72.7  kg  BMI:  Body mass index is 20.58 kg/m.  Estimated Nutritional Needs:   Kcal:  1800-2000  Protein:  90-100 grams  Fluid:  >1.8 L/day  Maylon Peppers RD, LDN, CNSC [phone] Pager [phone] After Hours Pager

## 2018-01-30 NOTE — Progress Notes (Signed)
STROKE TEAM PROGRESS NOTE   SUBJECTIVE (INTERVAL HISTORY) No family members present today   The patient'snurse is at the bedside. Cleviprex has been  weaned.  off .y   His home antihypertensive medications have been resumed.  The patient was able to follow some commands.  He has been started on a dysphagia diet along with this tube feedings.   .his labs continue to  look   improved with creatinine now down to 1.61 OBJECTIVE Temp:  [98 F (36.7 C)-98.6 F (37 C)] 98.5 F (36.9 C) (07/22 1418) Pulse Rate:  [67-102] 90 (07/22 1418) Cardiac Rhythm: Normal sinus rhythm (07/22 1255) Resp:  [11-25] 17 (07/22 1418) BP: (122-182)/(70-100) 168/93 (07/22 1418) SpO2:  [95 %-99 %] 97 % (07/22 1200) Weight:  [139 lb 5.3 oz (63.2 kg)] 139 lb 5.3 oz (63.2 kg) (07/22 0325)  CBC:  Recent Labs  Lab 01/26/18 0913  01/29/18 0338 01/30/18 0251  WBC 5.8   < > 10.6* 8.5  NEUTROABS 2.8  --   --   --   HGB 12.4*   < > 11.0* 13.5  HCT 39.6   < > 34.3* 42.4  MCV 82.2   < > 82.7 81.5  PLT 324   < > 338 179   < > = values in this interval not displayed.    Basic Metabolic Panel:  Recent Labs  Lab 01/27/18 0459 01/27/18 1739  01/29/18 0338 01/30/18 0251  NA 139  --    < > 145 147*  K 4.1  --    < > 4.2 4.3  CL 104  --    < > 112* 111  CO2 22  --    < > 25 25  GLUCOSE 155*  --    < > 187* 145*  BUN 40*  --    < > 45* 38*  CREATININE 2.64*  --    < > 2.16* 1.61*  CALCIUM 9.7  --    < > 9.4 9.6  MG 2.4 2.9*  --   --   --   PHOS 4.7* 5.0*  --   --   --    < > = values in this interval not displayed.    Lipid Panel:     Component Value Date/Time   CHOL 231 (H) 01/27/2018 0459   TRIG 142 01/30/2018 0251   HDL 49 01/27/2018 0459   CHOLHDL 4.7 01/27/2018 0459   VLDL 71 (H) 01/27/2018 0459   LDLCALC 111 (H) 01/27/2018 0459   HgbA1c:  Lab Results  Component Value Date   HGBA1C 6.0 (H) 01/27/2018   Urine Drug Screen:     Component Value Date/Time   LABOPIA NONE DETECTED 01/26/2018 1227   COCAINSCRNUR NONE DETECTED 01/26/2018 1227   LABBENZ NONE DETECTED 01/26/2018 1227   AMPHETMU NONE DETECTED 01/26/2018 1227   THCU NONE DETECTED 01/26/2018 1227   LABBARB (A) 01/26/2018 1227    Result not available. Reagent lot number recalled by manufacturer.    Alcohol Level     Component Value Date/Time   ETH <10 01/26/2018 0913    IMAGING I have personally reviewed the radiological images below and agree with the radiology interpretations.  Ct Angio Head and neck W Or Wo Contrast 01/26/2018 IMPRESSION:  1. No acute or focal vascular lesion to explain the patient's hemorrhage.  2. No definite enhancement or mass lesion.  3. Atherosclerotic changes involving the aortic arch and bilateral carotid bifurcations without a significant stenosis relative to the  more distal vessels.  4. Cavernous atherosclerotic changes are much more mild, also without a significant stenosis.  5. Left MCA branch vessels are displaced by the hemorrhagic mass.   Ct Head Wo Contrast 01/26/2018 IMPRESSION:  Unchanged size of large intraparenchymal hemorrhage centered in the left frontal white matter.  Unchanged 4 mm rightward midline shift.   Ct Head Code Stroke Wo Contrast 01/26/2018 IMPRESSION:  1. 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  2. Screening vasogenic edema and mass effect with 4 mm of midline shift and effacement of the left lateral ventricle.  3. Mild generalized white matter disease likely reflects the sequela of chronic microvascular ischemia   Transthoracic Echocardiogram  01/27/18 Study Conclusions  - Left ventricle: The cavity size was normal. Systolic function was   vigorous. The estimated ejection fraction was in the range of 65%   to 70%. Wall motion was normal; there were no regional wall   motion abnormalities. Left ventricular diastolic function   parameters were normal. - Aortic valve: Trileaflet; moderately thickened, moderately    calcified leaflets. Transvalvular velocity was within the normal   range. There was no stenosis. There was no regurgitation. - Mitral valve: There was no regurgitation. - Right ventricle: The cavity size was normal. Wall thickness was   normal. Systolic function was normal. - Tricuspid valve: There was no regurgitation. - Pulmonary arteries: Systolic pressure could not be accurately   estimated.  Impressions:  - Limited study quality, hyperdynamic LVEF, aortic valve leaflets   are calcified, no stenosis, no source of embolism was seen.  CT Head Wo Contrast 01/28/2018 IMPRESSION: 1. No significant interval change in size and distribution of large intraparenchymal hemorrhage centered at the left frontal white matter. Similar localized regional mass effect with persistent 4 mm left-to-right shift. 2. Interval development of small volume intraventricular hemorrhage. No hydrocephalus or ventricular trapping. 3. No other new acute intracranial abnormality.   PHYSICAL EXAM  Temp:  [98 F (36.7 C)-98.6 F (37 C)] 98.5 F (36.9 C) (07/22 1418) Pulse Rate:  [67-102] 90 (07/22 1418) Resp:  [11-25] 17 (07/22 1418) BP: (122-182)/(70-100) 168/93 (07/22 1418) SpO2:  [95 %-99 %] 97 % (07/22 1200) Weight:  [139 lb 5.3 oz (63.2 kg)] 139 lb 5.3 oz (63.2 kg) (07/22 0325)  General - frail elderly Caucasian male in no apparent distress.  Ophthalmologic - fundi not visualized due to noncooperation.  Cardiovascular - Regular rate and rhythm.  Neuro -alert awake   following simple midline and left bodycommands, minimal speech output.and speaks only a few words. Marked expressive greater than receptive Global aphasia. Left gaze preference but able to cross midline to right intermittently. Blinking to visual threat on the left, but inconsistent to the right. PERRL. Right nasolabial fold flattening, tongue midline in mouth. LUE and LLE 4/5, spontaneous movement, but RUE and RLE flaccid even with  pain stimulation. DTR 1+ and right babinski positive. Sensation, coordination and gait not tested.    ASSESSMENT/PLAN Adam Yang is a 72 y.o. male with history of hypertension, hyperlipidemia, nasopharyngeal cancer status post radiation, diabetes mellitus, chronic kidney disease, anxiety and claustrophobia presenting right-sided flaccidity visual deficits, and bradycardia. He did not receive IV t-PA due to [place].  ICH: left hemisphere large hematoma, likely due to hypertension. 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 4 mm rightward midline shift.  MRI head - not performed secondary to bradycardia.  CTA H&N - no source of bleeding identified. No enhancement  CT repeat showed stable hematoma  Repeat CT - no significant change other than interval development of small volume intraventricular hemorrhage.  2D Echo - EF 65 to 70%.  No cardiac source of emboli identified.  LDL - 111  HgbA1c - 6.2  VTE prophylaxis - SCDs Diet Order           Diet NPO time specified  Diet effective now          No antithrombotic prior to admission, now on No antithrombotic.  Ongoing aggressive stroke risk factor management  Therapy recommendations:  CLR Disposition:  CLR Hypertension  Stable on Cleviprex . SBP goal < 180 mmHG . Wean off cleviprex as able . Resumed home meds. Will add prn labetalol and hydralazine and wean cleviprex . Long-term BP goal normotensive  Hyperlipidemia  Lipid lowering medication PTA: Fenofibrate  LDL 111, goal < 70  Current lipid lowering medication: none  Consider statin once po access  Dysphagia   Baseline dysphagia due to radiation in the area  On IVF @ 4  Other Stroke Risk Factors  Advanced age  Former cigarette smoker - quit  Other Active Problems  CKD - nephrology follow as outpt Cre 2.50->2.80->2.64 -> 2.85 continue  gentle hydration  Nasopharyngeal cancer s/p radiation - following with ENT - last seen 08/2017 - no recurrent for 3 years.  Leukocytosis - WBCs 12.2 - monitor - afebrile  PLAN   continue blood pressure control and therapy evaluation. Transfer to inpatient rehabilitation when bed available  Speech evaluating. Change TF to evenings only if pt passes swallow. May need cortrak.  Monitor renal function   Hospital day # 4   Mobilize out of bed. Therapy consults and rehabilitation. Medically stable to transfer to rehabilitation if bed available. Discussed with patient and brother at the bedside and answered questions. Greater than 50% time during this 25 minute visit was spent on counseling and coordination of care about his intracerebral hemorrhage and discussing plans for rehabilitation and disposition     Antony Contras, MD Medical Director [place] Pager: [phone] 01/30/2018 3:28 PM   To contact Stroke Continuity provider, please refer to [email or web address] After hours, contact General Neurology

## 2018-01-30 NOTE — Progress Notes (Signed)
SLP Cancellation Note  Patient Details Name: Zaim Nitta MRN: [id number] DOB: Sep 10, 1945   Cancelled treatment:       Reason Eval/Treat Not Completed: Fatigue/lethargy limiting ability to participate   [place], MA, CCC-SLP 01/30/2018 10:33 AM

## 2018-01-30 NOTE — Progress Notes (Signed)
Physical Therapy Treatment Patient Details Name: Adam Yang MRN: [id number] DOB: 01/02/1946 Today's Date: 01/30/2018    History of Present Illness Adam Yang is a 72 y.o. male with history of hypertension, hyperlipidemia, nasopharyngeal cancer status post radiation, diabetes mellitus, chronic kidney disease, anxiety and claustrophobia presenting right-sided flaccidity visual deficits, and bradycardia. 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 Unchanged 4 mm rightward midline shift.     PT Comments    Continuing work on functional mobility and activity tolerance; Noting improvements in Adam Yang' efforts at communicating, and much improved following verbal commands compared to previous PT/OT session;   Pt's wife is a Marine scientist, and she confirmed over the phone that she and family will be available to assist pt 24 hours once home; Continue to recommend comprehensive inpatient rehab (CIR) for post-acute therapy needs.    Follow Up Recommendations  CIR     Equipment Recommendations  Other (comment)(To be determined)    Recommendations for Other Services Rehab consult     Precautions / Restrictions Precautions Precautions: Fall Restrictions Weight Bearing Restrictions: No    Mobility  Bed Mobility Overal bed mobility: Needs Assistance Bed Mobility: Rolling;Sidelying to Sit Rolling: Max assist Sidelying to sit: Max assist;+2 for safety/equipment       General bed mobility comments: Max assist and multimodal cues to roll an dpush up from L sidelying to sit; noted good effort with LUE to push through elbow and hand to come to sit  Transfers Overall transfer level: Needs assistance Equipment used: 2 person hand held assist(and support at gait belt) Transfers: Sit to/from Stand Sit to Stand: Max assist;+2 physical assistance         General transfer comment: Multimodal cues to initiate; Good push with LLE to stand; minimal acitvation  of RLE noted; L knee blocked for safety; Heavy L lean noted  Ambulation/Gait Ambulation/Gait assistance: Max assist;+2 physical assistance           General Gait Details: Pre-gait activities in standing; worked on R single limb stance with R knee supported anteriorly to prevent buckle and supported knee posteriorly to prevent hyperextension in stance; facilitated weight shifts onto RLE with brief L foot pickups   Stairs             Wheelchair Mobility    Modified Rankin (Stroke Patients Only) Modified Rankin (Stroke Patients Only) Pre-Morbid Rankin Score: No symptoms Modified Rankin: Severe disability     Balance Overall balance assessment: Needs assistance   Sitting balance-Leahy Scale: Poor Sitting balance - Comments: pushes to R with stronger LUE     Standing balance-Leahy Scale: Zero                              Cognition Arousal/Alertness: Awake/alert Behavior During Therapy: WFL for tasks assessed/performed Overall Cognitive Status: Difficult to assess                                 General Comments: More consistently following verbal commands; making attempts at answering questions      Exercises      General Comments General comments (skin integrity, edema, etc.): VSS on room air      Pertinent Vitals/Pain Pain Assessment: No/denies pain Faces Pain Scale: No hurt    Home Living Family/patient expects to be discharged to:: Private residence Living Arrangements: Spouse/significant other Available Help at Discharge:  Family;Available 24 hours/day Type of Home: House Home Access: Ramped entrance   Home Layout: Multi-level;Able to live on main level with bedroom/bathroom Home Equipment: Shower seat      Prior Function Level of Independence: Independent      Comments: retired Education officer, museum, enjoys gardening, enjoys computer work,    PT Goals (current goals can now be found in the care plan section) Acute Rehab PT  Goals Patient Stated Goal: no goal stated, but agreeable to work with PT/OT PT Goal Formulation: Patient unable to participate in goal setting Time For Goal Achievement: 02/10/18 Potential to Achieve Goals: Good Progress towards PT goals: Progressing toward goals    Frequency    Min 4X/week      PT Plan Current plan remains appropriate    Co-evaluation PT/OT/SLP Co-Evaluation/Treatment: Yes Reason for Co-Treatment: Complexity of the patient's impairments (multi-system involvement) PT goals addressed during session: Mobility/safety with mobility OT goals addressed during session: ADL's and self-care      AM-PAC PT "6 Clicks" Daily Activity  Outcome Measure  Difficulty turning over in bed (including adjusting bedclothes, sheets and blankets)?: Unable Difficulty moving from lying on back to sitting on the side of the bed? : Unable Difficulty sitting down on and standing up from a chair with arms (e.g., wheelchair, bedside commode, etc,.)?: Unable Help needed moving to and from a bed to chair (including a wheelchair)?: A Lot Help needed walking in hospital room?: Total Help needed climbing 3-5 steps with a railing? : Total 6 Click Score: 7    End of Session Equipment Utilized During Treatment: Gait belt Activity Tolerance: Patient tolerated treatment well Patient left: in bed;with call bell/phone within reach;with family/visitor present(awaiting transfer to 3W) Nurse Communication: Mobility status PT Visit Diagnosis: Unsteadiness on feet (R26.81);Hemiplegia and hemiparesis Hemiplegia - Right/Left: Right Hemiplegia - dominant/non-dominant: Non-dominant Hemiplegia - caused by: Nontraumatic intracerebral hemorrhage     Time: 5885-0277 PT Time Calculation (min) (ACUTE ONLY): 38 min  Charges:  $Therapeutic Activity: 8-22 mins                    G Codes:       Roney Marion, PT  Acute Rehabilitation Services Pager [phone] Office [phone]    [place] 01/30/2018, 1:13 PM

## 2018-01-30 NOTE — Progress Notes (Addendum)
Inpatient Rehabilitation  Patient was resting so I met with spouse and daughter at bedside to discuss team's recommendation for IP Rehab.  Shared booklets, discussed insurance, and answered initial questions.  Spouse weighing CIR versus SNF as insurance may not approve coverage of both and asked to speak with CSW, notified Kathlee Nations.  Plan to verify insurance and plan to follow up with patient and family.  Will follow for timing of medical readiness, family decision, insurance authorization, and IP Rehab bed availability.  Call if questions.    Carmelia Roller., CCC/SLP Admission Coordinator  [place]  Cell [phone]

## 2018-01-30 NOTE — Consult Note (Signed)
Physical Medicine and Rehabilitation Consult   Reason for Consult: Stroke with functional deficits.  Referring Physician: Dr. Erlinda Hong  HPI: Adam Yang is a 72 y.o. male with history of HTN, T2DM, CKD, nasopharynx cancer who was admitted on 01/26/18 with right sided weakness, R-HH and right facial weakness.  History taken from chart review and from some friends. CT head reviewed, showing large left hemorrhage with mass effect. 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 He was started on cleviprex and labetalol for BP control. UDS negative. CTA head/neck done and showed L-MCA branch vessels displaced by hemorrhagic mass and no significant stenosis or vascular lesion.   MRI held due to bradycardia.  2 D echo was limited study but showed EF 65-70% with moderately thickened aortic valve and no regional wall abnormality. Follow up CT head was stable except for development of small volume IVH. He remains NPO and was started on tube feeds for nutritional support. He has been weaned off cleviprex and BP medications being adjusted for better control.  Patient with resultant right flaccid hemiparesis, global aphasia, dysarthria and difficulty following commands. Therapy evaluations done this weekend and CIR recommended due to functional deficits.   Review of Systems  Unable to perform ROS: Mental acuity     Past Medical History:  Diagnosis Date  . Anxiety    Claustrophobia  . Arthritis   . Cancer (HCC)    nasopharynx  . Chronic cough    EXCESS MUCUS BUILDUP SINCE ORAL SURGERY FROM CANCER   . Chronic kidney disease    Renal Insuffiency , Creatinine has been in the 2- 3 range, 11/27/14 Creatine 1.4  . Diabetes mellitus without complication ([place])    Type II  . Gout   . History of radiation therapy 12/23/2014- 02/10/2015   Nasopharynx / Neck Adenopathy  . Hyperlipidemia   . Hypertension   . Inguinal hernia   . Right inguinal hernia 08/23/2017      Past Surgical History:  Procedure Laterality Date  . colonoscopy    . COLONOSCOPY W/ POLYPECTOMY    . INGUINAL HERNIA REPAIR Right 08/23/2017   Procedure: OPEN REPAIR RIGHT INGUINAL HERNIA WITH MESH;  Surgeon: Fanny Skates, MD;  Location: [facility];  Service: General;  Laterality: Right;  GENERAL AND TAP BLOCK  . INSERTION OF MESH Right 08/23/2017   Procedure: INSERTION OF MESH;  Surgeon: Fanny Skates, MD;  Location: [facility];  Service: General;  Laterality: Right;  GENERAL AND TAP BLOCK  . KNEE SURGERY Left 1990's   tibia crushed, pinned and bone grafted  . LYMPH NODE BIOPSY    . MULTIPLE EXTRACTIONS WITH ALVEOLOPLASTY N/A 12/06/2014   Procedure: Extraction of tooth #'s 2,4,5,6,7,8,9,10,11,12,13,14,15, 20,22,26,27,28,29 with alveoloplasty.;  Surgeon: Lenn Cal, DDS;  Location: [facility];  Service: Oral Surgery;  Laterality: N/A;    Family History  Problem Relation Age of Onset  . Cancer Mother        throat ca  . Cancer Father        lung ca  . Cancer Brother        throat ca  . Heart block Brother     Social History:  Married.  Per reports that he quit smoking about 19 years ago. He has a 25.00 pack-year smoking history. He has never used smokeless tobacco. Per reports he does not drink alcohol or use drugs.    Allergies: No Known Allergies    Medications Prior to  Admission  Medication Sig Dispense Refill  . amLODipine (NORVASC) 5 MG tablet Take 5 mg by mouth daily.     Marland Kitchen guaiFENesin (MUCINEX) 600 MG 12 hr tablet Take by mouth 2 (two) times daily as needed.    . Multiple Vitamins-Minerals (CENTRUM SILVER PO) Take 1 tablet by mouth daily.     . Omega-3 Fatty Acids (FISH OIL PO) Take 1 capsule by mouth daily.     Marland Kitchen tiZANidine (ZANAFLEX) 4 MG tablet Take 4 mg by mouth at bedtime.  4  . fenofibrate 160 MG tablet Take 160 mg by mouth daily.      Home: Home Living Family/patient expects to be discharged to:: Inpatient rehab Living Arrangements: Spouse/significant  other Additional Comments: Unable to glean information about PLOF or home setup from pt  Lives With: Spouse  Functional History: Prior Function Comments: Unknown Functional Status:  Mobility: Bed Mobility Overal bed mobility: Needs Assistance Bed Mobility: Supine to Sit Supine to sit: +2 for physical assistance, Max assist General bed mobility comments: pt with posterior lean to the L initially and needs (A) to elevate trunk Transfers Overall transfer level: Needs assistance Equipment used: 2 person hand held assist Transfers: Stand Pivot Transfers, Sit to/from Stand Sit to Stand: Mod assist, +2 physical assistance, +2 safety/equipment Stand pivot transfers: Mod assist, +2 physical assistance, +2 safety/equipment General transfer comment: pt initiated steppnig to chair on the L side. pt needs R LE blocked. pt shifting all weigh tot he L LE and R LE TDWB      ADL: ADL Overall ADL's : Needs assistance/impaired Eating/Feeding: NPO Grooming: Maximal assistance Upper Body Bathing: Maximal assistance Lower Body Bathing: Total assistance Upper Body Dressing : Maximal assistance Lower Body Dressing: Total assistance Toilet Transfer: +2 for safety/equipment, Moderate assistance General ADL Comments: pt verbalized "yes" several times in the session but not consistently in a correct use. pT reports "yes" when looking out the window "is it raining" pt answer "YES" when asked "is your birthday in [birth year]"  Cognition: Cognition Overall Cognitive Status: Difficult to assess Orientation Level: Other (comment)(expressive aphasia) Attention: Sustained Sustained Attention: Appears intact Cognition Arousal/Alertness: Awake/alert Behavior During Therapy: WFL for tasks assessed/performed Overall Cognitive Status: Difficult to assess General Comments: Answers yes/no questions correclty about 50% of time; makes attempts to say his name, lots of difficulty Difficult to assess due to: Impaired  communication   Blood pressure (!) 181/100, pulse 88, temperature 98 F (36.7 C), temperature source Axillary, resp. rate 20, height 5\' 9"  (1.753 m), weight 63.2 kg (139 lb 5.3 oz), SpO2 98 %. Physical Exam  Nursing note and vitals reviewed. Constitutional: He appears well-developed. He appears lethargic. He appears cachectic.  cortack in nares  HENT:  Head: Normocephalic and atraumatic.  edentulous   Eyes: EOM are normal. Right eye exhibits no discharge. Left eye exhibits no discharge.  Neck: Normal range of motion. Neck supple.  Cardiovascular: Normal rate and regular rhythm.  Respiratory: Effort normal and breath sounds normal.  GI: Soft. Bowel sounds are normal.  Musculoskeletal:  No edema or tenderness in extremities  Neurological: He appears lethargic.  Unintelligible sounds Globally aphasic.  Not following commands. No spontaneous movement noted Right upper extremity/right lower extremity hyperreflexic Right lower extremity clonus  Skin: Skin is warm and dry.  Psychiatric:  Unable to assess due to mentation    Results for orders placed or performed during the hospital encounter of 01/26/18 (from the past 24 hour(s))  Glucose, capillary     Status: Abnormal  Collection Time: 01/29/18 11:57 AM  Result Value Ref Range   Glucose-Capillary 148 (H) 70 - 99 mg/dL  Glucose, capillary     Status: Abnormal   Collection Time: 01/29/18  7:46 PM  Result Value Ref Range   Glucose-Capillary 159 (H) 70 - 99 mg/dL  Glucose, capillary     Status: Abnormal   Collection Time: 01/30/18 12:21 AM  Result Value Ref Range   Glucose-Capillary 180 (H) 70 - 99 mg/dL  Triglycerides     Status: None   Collection Time: 01/30/18  2:51 AM  Result Value Ref Range   Triglycerides 142 <150 mg/dL  CBC     Status: None   Collection Time: 01/30/18  2:51 AM  Result Value Ref Range   WBC 8.5 4.0 - 10.5 K/uL   RBC 5.20 4.22 - 5.81 MIL/uL   Hemoglobin 13.5 13.0 - 17.0 g/dL   HCT 42.4 39.0 - 52.0 %     MCV 81.5 78.0 - 100.0 fL   MCH 26.0 26.0 - 34.0 pg   MCHC 31.8 30.0 - 36.0 g/dL   RDW 14.8 11.5 - 15.5 %   Platelets 179 150 - 400 K/uL  Basic metabolic panel     Status: Abnormal   Collection Time: 01/30/18  2:51 AM  Result Value Ref Range   Sodium 147 (H) 135 - 145 mmol/L   Potassium 4.3 3.5 - 5.1 mmol/L   Chloride 111 98 - 111 mmol/L   CO2 25 22 - 32 mmol/L   Glucose, Bld 145 (H) 70 - 99 mg/dL   BUN 38 (H) 8 - 23 mg/dL   Creatinine, Ser 1.61 (H) 0.61 - 1.24 mg/dL   Calcium 9.6 8.9 - 10.3 mg/dL   GFR calc non Af Amer 42 (L) >60 mL/min   GFR calc Af Amer 48 (L) >60 mL/min   Anion gap 11 5 - 15  Glucose, capillary     Status: Abnormal   Collection Time: 01/30/18  3:32 AM  Result Value Ref Range   Glucose-Capillary 151 (H) 70 - 99 mg/dL   No results found.  Assessment/Plan: Diagnosis: Large left frontoparietal hemorrhagic infarct with vasogenic edema and mass effect Labs and images (see above) independently reviewed.  Records reviewed and summated above. Stroke: Continue secondary stroke prophylaxis and Risk Factor Modification listed below:   Blood Pressure Management:  Continue current medication with prn's with permisive HTN per primary team Diabetes management:   Right sided hemiparesis: fit for orthosis to prevent contractures (resting hand splint for day, wrist cock up splint at night, PRAFO, etc) Motor recovery: Fluoxetine  1. Does the need for close, 24 hr/day medical supervision in concert with the patient's rehab needs make it unreasonable for this patient to be served in a less intensive setting? Yes 2. Co-Morbidities requiring supervision/potential complications: HTN (monitor and provide prns in accordance with increased physical exertion and pain), T2 DM (Monitor in accordance with exercise and adjust meds as necessary), AKI on CKD (avoid nephrotoxic meds), nasopharynx cancer, hyponatremia (continue to monitor, treated necessary)  3. Due to bladder management,  bowel management, safety, disease management, medication administration and patient education, does the patient require 24 hr/day rehab nursing? Yes 4. Does the patient require coordinated care of a physician, rehab nurse, PT (1-2 hrs/day, 5 days/week), OT (1-2 hrs/day, 5 days/week) and SLP (1-2 hrs/day, 5 days/week) to address physical and functional deficits in the context of the above medical diagnosis(es)? Yes Addressing deficits in the following areas: balance, endurance, locomotion, strength,  transferring, bowel/bladder control, bathing, dressing, feeding, grooming, toileting, cognition, speech, language, swallowing and psychosocial support 5. Can the patient actively participate in an intensive therapy program of at least 3 hrs of therapy per day at least 5 days per week? Potentially 6. The potential for patient to make measurable gains while on inpatient rehab is excellent 7. Anticipated functional outcomes upon discharge from inpatient rehab are min assist and mod assist  with PT, min assist and mod assist with OT, min assist and mod assist with SLP. 8. Estimated rehab length of stay to reach the above functional goals is: 25-30 days. 9. Anticipated D/C setting: TBD. 10. Anticipated post D/C treatments: HH therapy and Home excercise program 11. Overall Rehab/Functional Prognosis: good  RECOMMENDATIONS: This patient's condition is appropriate for continued rehabilitative care in the following setting: CIR to decrease burden of care when medically stable. Patient has agreed to participate in recommended program. Potentially Note that insurance prior authorization may be required for reimbursement for recommended care.  Comment: Rehab Admissions Coordinator to follow up.   I have personally performed a face to face diagnostic evaluation, including, but not limited to relevant history and physical exam findings, of this patient and developed relevant assessment and plan.  Additionally, I have  reviewed and concur with the physician assistant's documentation above.   Delice Lesch, MD, ABPMR Bary Leriche, PA-C 01/30/2018

## 2018-01-30 NOTE — Progress Notes (Signed)
Occupational Therapy Treatment Patient Details Name: Adam Yang MRN: [id number] DOB: 1946-05-29 Today's Date: 01/30/2018    History of present illness [name] is a 72 y.o. male with history of hypertension, hyperlipidemia, nasopharyngeal cancer status post radiation, diabetes mellitus, chronic kidney disease, anxiety and claustrophobia presenting right-sided flaccidity visual deficits, and bradycardia. 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 Unchanged 4 mm rightward midline shift.    OT comments  Pt progressing towards OT goals this session. Pt's RUE remains flaccid, but he reports (through yes and no) that sensation is intact. Pt was able to sit EOB and perform grooming activity with heavy mod for seated balance. We then proceeded to work on sit <>Stand for activity tolerance and functional transfers including challenging the RLE with lifting the LUE briefly off the ground with Max A from standing therapist, and one therapist assisting the RLE. Pt with noted improved command following - although he attempted to communicate he was unable to find the words. Pt will have 24 hour supervision from wife Investment banker, corporate) and adult daughter/son-in-law and so CIR is essential to maximize safety and independence in ADL and functional transfers  FYI: Pt is former Print production planner at [employer], enjoys movies, gardening  Follow Up Manufacturing systems engineer (measurements OT);Wheelchair cushion (measurements OT);3 in 1 bedside commode    Recommendations for Other Services      Precautions / Restrictions Precautions Precautions: Fall Restrictions Weight Bearing Restrictions: No       Mobility Bed Mobility Overal bed mobility: Needs Assistance Bed Mobility: Rolling;Sidelying to Sit Rolling: Max assist Sidelying to sit: Max assist;+2 for safety/equipment       General bed mobility comments: Max assist and multimodal cues to roll  an dpush up from L sidelying to sit; noted good effort with LUE to push through elbow and hand to come to sit  Transfers Overall transfer level: Needs assistance Equipment used: 2 person hand held assist(and support at gait belt) Transfers: Sit to/from Stand Sit to Stand: Max assist;+2 physical assistance         General transfer comment: Multimodal cues to initiate; Good push with LLE to stand; minimal acitvation of RLE noted; L knee blocked for safety; Heavy L lean noted    Balance Overall balance assessment: Needs assistance   Sitting balance-Leahy Scale: Poor Sitting balance - Comments: pushes to R with stronger LUE     Standing balance-Leahy Scale: Zero                             ADL either performed or assessed with clinical judgement   ADL Overall ADL's : Needs assistance/impaired Eating/Feeding: NPO   Grooming: Wash/dry face;Moderate assistance;Sitting Grooming Details (indicate cue type and reason): used LUE             Lower Body Dressing: Total assistance;Bed level Lower Body Dressing Details (indicate cue type and reason): to don socks Toilet Transfer: Moderate assistance;+2 for physical assistance;+2 for safety/equipment Toilet Transfer Details (indicate cue type and reason): simulated through sit <>stand from bed and single leg raises in standing           General ADL Comments: Pt attempted to communicate several times but had trouble finding words to express himself. Pt able to use LUE for tasks (left handed)     Vision       Perception     Praxis  Cognition Arousal/Alertness: Awake/alert Behavior During Therapy: WFL for tasks assessed/performed Overall Cognitive Status: Difficult to assess                                 General Comments: More consistently following verbal commands; making attempts at answering questions        Exercises     Shoulder Instructions       General Comments VSS on room  air    Pertinent Vitals/ Pain       Pain Assessment: No/denies pain Faces Pain Scale: No hurt  Home Living                                          Prior Functioning/Environment              Frequency  Min 3X/week        Progress Toward Goals  OT Goals(current goals can now be found in the care plan section)  Progress towards OT goals: Progressing toward goals  Acute Rehab OT Goals Patient Stated Goal: no goal stated, but agreeable to work with PT/OT Time For Goal Achievement: 02/10/18 Potential to Achieve Goals: Good  Plan Discharge plan remains appropriate;Frequency remains appropriate    Co-evaluation    PT/OT/SLP Co-Evaluation/Treatment: Yes Reason for Co-Treatment: Complexity of the patient's impairments (multi-system involvement) PT goals addressed during session: Balance;Mobility/safety with mobility;Strengthening/ROM OT goals addressed during session: ADL's and self-care;Strengthening/ROM      AM-PAC PT "6 Clicks" Daily Activity     Outcome Measure   Help from another person eating meals?: Total Help from another person taking care of personal grooming?: A Lot Help from another person toileting, which includes using toliet, bedpan, or urinal?: A Lot Help from another person bathing (including washing, rinsing, drying)?: A Lot Help from another person to put on and taking off regular upper body clothing?: A Lot Help from another person to put on and taking off regular lower body clothing?: Total 6 Click Score: 10    End of Session Equipment Utilized During Treatment: Gait belt  OT Visit Diagnosis: Unsteadiness on feet (R26.81);Muscle weakness (generalized) (M62.81)   Activity Tolerance Patient tolerated treatment well   Patient Left with call bell/phone within reach;with chair alarm set;in bed;with family/visitor present   Nurse Communication Mobility status;Precautions        Time: 5056-9794 OT Time Calculation (min): 39  min  Charges: OT General Charges $OT Visit: 1 Visit OT Treatments $Self Care/Home Management : 8-22 mins  Hulda Humphrey OTR/L [place] 01/30/2018, 3:55 PM

## 2018-01-31 LAB — CBC
HCT: 40.7 % (ref 39.0–52.0)
HEMOGLOBIN: 12.6 g/dL — AB (ref 13.0–17.0)
MCH: 26 pg (ref 26.0–34.0)
MCHC: 31 g/dL (ref 30.0–36.0)
MCV: 84.1 fL (ref 78.0–100.0)
Platelets: 301 10*3/uL (ref 150–400)
RBC: 4.84 MIL/uL (ref 4.22–5.81)
RDW: 15.3 % (ref 11.5–15.5)
WBC: 8.2 10*3/uL (ref 4.0–10.5)

## 2018-01-31 LAB — BASIC METABOLIC PANEL
Anion gap: 9 (ref 5–15)
BUN: 49 mg/dL — ABNORMAL HIGH (ref 8–23)
CHLORIDE: 113 mmol/L — AB (ref 98–111)
CO2: 26 mmol/L (ref 22–32)
Calcium: 9.5 mg/dL (ref 8.9–10.3)
Creatinine, Ser: 1.88 mg/dL — ABNORMAL HIGH (ref 0.61–1.24)
GFR calc non Af Amer: 35 mL/min — ABNORMAL LOW (ref >60)
GFR, EST AFRICAN AMERICAN: 40 mL/min — AB (ref >60)
Glucose, Bld: 176 mg/dL — ABNORMAL HIGH (ref 70–99)
POTASSIUM: 4.2 mmol/L (ref 3.5–5.1)
SODIUM: 148 mmol/L — AB (ref 135–145)

## 2018-01-31 LAB — GLUCOSE, CAPILLARY
GLUCOSE-CAPILLARY: 152 mg/dL — AB (ref 70–99)
GLUCOSE-CAPILLARY: 155 mg/dL — AB (ref 70–99)
GLUCOSE-CAPILLARY: 156 mg/dL — AB (ref 70–99)
GLUCOSE-CAPILLARY: 192 mg/dL — AB (ref 70–99)
Glucose-Capillary: 186 mg/dL — ABNORMAL HIGH (ref 70–99)

## 2018-01-31 MED ORDER — SODIUM CHLORIDE 0.9 % IV SOLN: 500.0000 mL | INTRAVENOUS | Status: DC

## 2018-01-31 MED ORDER — POLYETHYLENE GLYCOL 3350 17 G PO PACK
17.0000 g | PACK | Freq: Every day | ORAL | Status: DC
  Administered 2018-01-31 – 2018-02-02 (×3): 17 g via ORAL
  Filled 2018-01-31 (×3): qty 1

## 2018-01-31 NOTE — Progress Notes (Signed)
  Speech Language Pathology Treatment: Dysphagia  Patient Details Name: Adam Yang MRN: [id number] DOB: [birth year] Today's Date: 01/31/2018 Time: 8469-6295 SLP Time Calculation (min) (ACUTE ONLY): 40 min  Assessment / Plan / Recommendation Clinical Impression  [name] present as well as MD. Plan for patient to discharge to CIR. MD spoke with wife about recommendation for PEG. Patient has oropharyngeal dysphagia at baseline, compensatory strategies were effective to tolerate a Dys 1, thin liquid diet to maintain his weight. 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  Pt had an immediate strong cough after 1/2 tsp of water. Ice chip was not given as wife reports he is temperature sensitive (cold more than hot) and will often have muscle spasms in his neck in response to cold foods. These spasms result in difficulty breathing. Patient tolerated trials of 1/2 tsp room temp applesauce with multiple throat clearing and re-swallows (wife reports this is his baseline swallow). Recommend a repeat MBS to determine if patient is safe to resume a diet at this time.   HPI HPI: [name] is a 72 y.o. male with history of hypertension, hyperlipidemia, nasopharyngeal cancer status post radiation, diabetes mellitus, chronic kidney disease, anxiety and claustrophobia presenting right-sided flaccidity visual deficits, and bradycardia. 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 Unchanged 4 mm rightward midline shift.  Esophagram on 01/27/18 showed normal pharyngeal function, tertiary contractions consistent with esophageal dysmotility.       SLP Plan  New goals to be determined pending instrumental study;Continue with current plan of care       Recommendations  Diet recommendations: NPO                Oral Care Recommendations: Oral care QID Follow up Recommendations: Inpatient Rehab SLP Visit Diagnosis:  Dysphagia, oropharyngeal phase (R13.12) Plan: New goals to be determined pending instrumental study;Continue with current plan of care       [place], MA, CCC-SLP 01/31/2018 11:58 AM

## 2018-01-31 NOTE — Care Management Important Message (Signed)
Important Message  Patient Details  Name: Adam Yang MRN: [id number] Date of Birth: 01/31/1946   Medicare Important Message Given:  Yes    Antara Brecheisen Montine Circle 01/31/2018, 2:15 PM

## 2018-01-31 NOTE — Plan of Care (Signed)
Adam Yang is relatively unchanged from yesterday.  PT put him in the chair this morning, where he stayed for several hours.  He is difficult to transfer from chair to bed with 2 assist.  Given the right side deficits and decreased responsiveness of the left leg, I recommend a lift for further transfers.  Neuro checks continue Q4, tube feeding continuous at 60.  SLP evaluated today with some success.  The patient has not had a BM since admission.  RN added Miralax under standing orders.  Wife and daughter were at bedside.  Wife is a former pediatric Rn.  We will continue to monitor.

## 2018-01-31 NOTE — Progress Notes (Signed)
Inpatient Rehabilitation  Met with patient and spouse at bedside to continue discussions about IP Rehab.  Shared insurance verification letter and answered questions.  They are in favor of IP Rehab with plan for home.  Wife requesting resources for community/financial assist during hospitalization and requested to speak with the CSW, notified CSW.   I have initiated insurance authorization.  Plan to follow for timing of medical readiness, insurance approval, and IP Rehab bed availability.  Call if questions.    Carmelia Roller., CCC/SLP Admission Coordinator  [facility]  Cell [phone]

## 2018-01-31 NOTE — Progress Notes (Signed)
STROKE TEAM PROGRESS NOTE   SUBJECTIVE (INTERVAL HISTORY) His wife is present today      His home antihypertensive medications have been resumed.  The  ST now recommends NPO and only  while the tube feedings.   .his labs continue to  look   improved with creatinine now   to 1.88 and Na is associate148 OBJECTIVE Temp:  [97.5 F (36.4 C)-98.5 F (36.9 C)] 98 F (36.7 C) (07/23 1329) Pulse Rate:  [81-82] 81 (07/23 1329) Cardiac Rhythm: Normal sinus rhythm (07/23 0700) Resp:  [15-20] 15 (07/23 1329) BP: (143-178)/(88-98) 143/89 (07/23 1329) SpO2:  [97 %-98 %] 98 % (07/23 1329) Weight:  [139 lb 5.3 oz (63.2 kg)] 139 lb 5.3 oz (63.2 kg) (07/23 0500)  CBC:  Recent Labs  Lab 01/26/18 0913  01/30/18 0251 01/31/18 0401  WBC 5.8   < > 8.5 8.2  NEUTROABS 2.8  --   --   --   HGB 12.4*   < > 13.5 12.6*  HCT 39.6   < > 42.4 40.7  MCV 82.2   < > 81.5 84.1  PLT 324   < > 179 301   < > = values in this interval not displayed.    Basic Metabolic Panel:  Recent Labs  Lab 01/27/18 0459 01/27/18 1739  01/30/18 0251 01/31/18 0401  NA 139  --    < > 147* 148*  K 4.1  --    < > 4.3 4.2  CL 104  --    < > 111 113*  CO2 22  --    < > 25 26  GLUCOSE 155*  --    < > 145* 176*  BUN 40*  --    < > 38* 49*  CREATININE 2.64*  --    < > 1.61* 1.88*  CALCIUM 9.7  --    < > 9.6 9.5  MG 2.4 2.9*  --   --   --   PHOS 4.7* 5.0*  --   --   --    < > = values in this interval not displayed.    Lipid Panel:     Component Value Date/Time   CHOL 231 (H) 01/27/2018 0459   TRIG 142 01/30/2018 0251   HDL 49 01/27/2018 0459   CHOLHDL 4.7 01/27/2018 0459   VLDL 71 (H) 01/27/2018 0459   LDLCALC 111 (H) 01/27/2018 0459   HgbA1c:  Lab Results  Component Value Date   HGBA1C 6.0 (H) 01/27/2018   Urine Drug Screen:     Component Value Date/Time   LABOPIA NONE DETECTED 01/26/2018 1227   COCAINSCRNUR NONE DETECTED 01/26/2018 1227   LABBENZ NONE DETECTED 01/26/2018 1227   AMPHETMU NONE DETECTED  01/26/2018 1227   THCU NONE DETECTED 01/26/2018 1227   LABBARB (A) 01/26/2018 1227    Result not available. Reagent lot number recalled by manufacturer.    Alcohol Level     Component Value Date/Time   ETH <10 01/26/2018 0913    IMAGING I have personally reviewed the radiological images below and agree with the radiology interpretations.  Ct Angio Head and neck W Or Wo Contrast 01/26/2018 IMPRESSION:  1. No acute or focal vascular lesion to explain the patient's hemorrhage.  2. No definite enhancement or mass lesion.  3. Atherosclerotic changes involving the aortic arch and bilateral carotid bifurcations without a significant stenosis relative to the more distal vessels.  4. Cavernous atherosclerotic changes are much more mild, also without a significant  stenosis.  5. Left MCA branch vessels are displaced by the hemorrhagic mass.   Ct Head Wo Contrast 01/26/2018 IMPRESSION:  Unchanged size of large intraparenchymal hemorrhage centered in the left frontal white matter.  Unchanged 4 mm rightward midline shift.   Ct Head Code Stroke Wo Contrast 01/26/2018 IMPRESSION:  1. 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  2. Screening vasogenic edema and mass effect with 4 mm of midline shift and effacement of the left lateral ventricle.  3. Mild generalized white matter disease likely reflects the sequela of chronic microvascular ischemia   Transthoracic Echocardiogram  01/27/18 Study Conclusions  - Left ventricle: The cavity size was normal. Systolic function was   vigorous. The estimated ejection fraction was in the range of 65%   to 70%. Wall motion was normal; there were no regional wall   motion abnormalities. Left ventricular diastolic function   parameters were normal. - Aortic valve: Trileaflet; moderately thickened, moderately   calcified leaflets. Transvalvular velocity was within the normal   range. There was no stenosis. There was no  regurgitation. - Mitral valve: There was no regurgitation. - Right ventricle: The cavity size was normal. Wall thickness was   normal. Systolic function was normal. - Tricuspid valve: There was no regurgitation. - Pulmonary arteries: Systolic pressure could not be accurately   estimated.  Impressions:  - Limited study quality, hyperdynamic LVEF, aortic valve leaflets   are calcified, no stenosis, no source of embolism was seen.  CT Head Wo Contrast 01/28/2018 IMPRESSION: 1. No significant interval change in size and distribution of large intraparenchymal hemorrhage centered at the left frontal white matter. Similar localized regional mass effect with persistent 4 mm left-to-right shift. 2. Interval development of small volume intraventricular hemorrhage. No hydrocephalus or ventricular trapping. 3. No other new acute intracranial abnormality.   PHYSICAL EXAM  Temp:  [97.5 F (36.4 C)-98.5 F (36.9 C)] 98 F (36.7 C) (07/23 1329) Pulse Rate:  [81-82] 81 (07/23 1329) Resp:  [15-20] 15 (07/23 1329) BP: (143-178)/(88-98) 143/89 (07/23 1329) SpO2:  [97 %-98 %] 98 % (07/23 1329) Weight:  [139 lb 5.3 oz (63.2 kg)] 139 lb 5.3 oz (63.2 kg) (07/23 0500)  General - frail elderly Caucasian male in no apparent distress.  Ophthalmologic - fundi not visualized due to noncooperation.  Cardiovascular - Regular rate and rhythm.  Neuro -alert awake   following simple midline and left bodycommands, minimal speech output.and speaks only a few words. Marked expressive greater than receptive Global aphasia. Left gaze preference but able to cross midline to right intermittently. Blinking to visual threat on the left, but inconsistent to the right. PERRL. Right nasolabial fold flattening, tongue midline in mouth. LUE and LLE 4/5, spontaneous movement, but RUE and RLE flaccid even with pain stimulation. DTR 1+ and right babinski positive. Sensation, coordination and gait not  tested.    ASSESSMENT/PLAN Adam Yang is a 72 y.o. male with history of hypertension, hyperlipidemia, nasopharyngeal cancer status post radiation, diabetes mellitus, chronic kidney disease, anxiety and claustrophobia presenting right-sided flaccidity visual deficits, and bradycardia. He did not receive IV t-PA due to [place].  ICH: left hemisphere large hematoma, likely due to hypertension. 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 4 mm rightward midline shift.   MRI head - not performed secondary to bradycardia.  CTA H&N - no source of  bleeding identified. No enhancement  CT repeat showed stable hematoma  Repeat CT - no significant change other than interval development of small volume intraventricular hemorrhage.  2D Echo - EF 65 to 70%.  No cardiac source of emboli identified.  LDL - 111  HgbA1c - 6.2  VTE prophylaxis - SCDs Diet Order           Diet NPO time specified  Diet effective now          No antithrombotic prior to admission, now on No antithrombotic.  Ongoing aggressive stroke risk factor management  Therapy recommendations:  CLR Disposition:  CLR Hypertension  Stable on Cleviprex . SBP goal < 180 mmHG . Wean off cleviprex as able . Resumed home meds. Will add prn labetalol and hydralazine and wean cleviprex . 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  Leukocytosis - WBCs 12.2 - monitor - afebrile  PLAN   continue blood pressure control and therapy evaluation. Transfer to inpatient rehabilitation when bed available  Speech evaluating. Change TF to evenings only if pt passes swallow. Continue cortrak.  Monitor renal function   Hospital day # 5   Mobilize out of bed. Therapy consults and rehabilitation. IV hydration with NS Medically stable to transfer to rehabilitation if bed available. Discussed with patient and wife at the bedside and answered questions. Greater than 50% time during this 25 minute visit was spent on counseling and coordination of care about his intracerebral hemorrhage and discussing plans for rehabilitation and disposition     Antony Contras, MD Medical Director [place] Pager: [phone] 01/31/2018 4:33 PM   To contact Stroke Continuity provider, please refer to [email or web address] After hours, contact General Neurology

## 2018-01-31 NOTE — Progress Notes (Signed)
Physical Therapy Treatment Patient Details Name: Adam Yang MRN: [id number] DOB: Aug 15, 1945 Today's Date: 01/31/2018    History of Present Illness Adam Yang is a 72 y.o. male with history of hypertension, hyperlipidemia, nasopharyngeal cancer status post radiation, diabetes mellitus, chronic kidney disease, anxiety and claustrophobia presenting right-sided flaccidity visual deficits, and bradycardia. 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 Unchanged 4 mm rightward midline shift.     PT Comments    Patient agreeable to participate in therapy and tolerated session well. This session focused on balance and functional transfers. Pt requires +2 for OOB transfers. Wife present throughout session.  Continue to progress as tolerated. Current plan remains appropriate.   Follow Up Recommendations  CIR     Equipment Recommendations  Other (comment)(TBD next venue)    Recommendations for Other Services Rehab consult     Precautions / Restrictions Precautions Precautions: Fall    Mobility  Bed Mobility Overal bed mobility: Needs Assistance Bed Mobility: Supine to Sit     Supine to sit: Max assist     General bed mobility comments: assistance required to bring R LE and hips to EOB and to elevate trunk into sitting; pt assisted with L UE and LE; cues for sequencing   Transfers Overall transfer level: Needs assistance Equipment used: 2 person hand held assist(use of gait belt and bed pad to stand) Transfers: Sit to/from [facility] Sit to Stand: +2 physical assistance;Mod assist;Max assist Stand pivot transfers: Max assist;+2 physical assistance       General transfer comment: pt requires assistance to power up into standing and to maintain balance upon standing with R knee blocked and facilitation at pelvis and trunk for upright posture and weight shifting for pivot to recliner   Ambulation/Gait                  Stairs             Wheelchair Mobility    Modified Rankin (Stroke Patients Only) Modified Rankin (Stroke Patients Only) Pre-Morbid Rankin Score: No symptoms Modified Rankin: Severe disability     Balance Overall balance assessment: Needs assistance   Sitting balance-Leahy Scale: Poor Sitting balance - Comments: pushes to R with stronger LUE     Standing balance-Leahy Scale: Zero                              Cognition Arousal/Alertness: Awake/alert Behavior During Therapy: WFL for tasks assessed/performed Overall Cognitive Status: Difficult to assess                                        Exercises      General Comments General comments (skin integrity, edema, etc.): wife present throughout session      Pertinent Vitals/Pain Pain Assessment: No/denies pain Faces Pain Scale: No hurt    Home Living                      Prior Function            PT Goals (current goals can now be found in the care plan section) Acute Rehab PT Goals Patient Stated Goal: no goal stated, but agreeable to work with PT/OT Progress towards PT goals: Progressing toward goals    Frequency    Min 4X/week  PT Plan Current plan remains appropriate    Co-evaluation              AM-PAC PT "6 Clicks" Daily Activity  Outcome Measure  Difficulty turning over in bed (including adjusting bedclothes, sheets and blankets)?: Unable Difficulty moving from lying on back to sitting on the side of the bed? : Unable Difficulty sitting down on and standing up from a chair with arms (e.g., wheelchair, bedside commode, etc,.)?: Unable Help needed moving to and from a bed to chair (including a wheelchair)?: A Lot Help needed walking in hospital room?: Total Help needed climbing 3-5 steps with a railing? : Total 6 Click Score: 7    End of Session Equipment Utilized During Treatment: Gait belt Activity Tolerance: Patient tolerated  treatment well Patient left: with call bell/phone within reach;in chair;with chair alarm set;with family/visitor present Nurse Communication: Mobility status PT Visit Diagnosis: Unsteadiness on feet (R26.81);Hemiplegia and hemiparesis Hemiplegia - Right/Left: Right Hemiplegia - dominant/non-dominant: Non-dominant Hemiplegia - caused by: Nontraumatic intracerebral hemorrhage     Time: 1204-1232 PT Time Calculation (min) (ACUTE ONLY): 28 min  Charges:  $Therapeutic Activity: 23-37 mins                    G Codes:       Earney Navy, PTA Pager: [phone]     Darliss Cheney 01/31/2018, 3:14 PM

## 2018-02-01 LAB — GLUCOSE, CAPILLARY
GLUCOSE-CAPILLARY: 164 mg/dL — AB (ref 70–99)
GLUCOSE-CAPILLARY: 166 mg/dL — AB (ref 70–99)
Glucose-Capillary: 146 mg/dL — ABNORMAL HIGH (ref 70–99)
Glucose-Capillary: 153 mg/dL — ABNORMAL HIGH (ref 70–99)
Glucose-Capillary: 157 mg/dL — ABNORMAL HIGH (ref 70–99)
Glucose-Capillary: 163 mg/dL — ABNORMAL HIGH (ref 70–99)
Glucose-Capillary: 189 mg/dL — ABNORMAL HIGH (ref 70–99)

## 2018-02-01 MED ORDER — LABETALOL HCL 5 MG/ML IV SOLN: 10.0000 mg | INTRAVENOUS | Status: DC | PRN

## 2018-02-01 MED ORDER — INSULIN ASPART 100 UNIT/ML ~~LOC~~ SOLN: 0.0000 [IU] | Freq: Three times a day (TID) | SUBCUTANEOUS | Status: DC

## 2018-02-01 NOTE — Progress Notes (Signed)
STROKE TEAM PROGRESS NOTE   SUBJECTIVE (INTERVAL HISTORY) His wife is present today      His home antihypertensive medications have been resumed.  The  ST now recommends NPO and only  while the tube feedings.   .his labs continue to  look   improved with creatinine now   to 1.88 and Na is associate148 OBJECTIVE Temp:  [97.6 F (36.4 C)-98.6 F (37 C)] 97.6 F (36.4 C) (07/24 1100) Pulse Rate:  [77-95] 90 (07/24 0845) Cardiac Rhythm: Normal sinus rhythm (07/24 0800) Resp:  [17] 17 (07/24 0350) BP: (136-187)/(80-97) 155/89 (07/24 0845) SpO2:  [97 %-98 %] 98 % (07/24 0845) Weight:  [135 lb 2.3 oz (61.3 kg)] 135 lb 2.3 oz (61.3 kg) (07/24 0600)  CBC:  Recent Labs  Lab 01/26/18 0913  01/30/18 0251 01/31/18 0401  WBC 5.8   < > 8.5 8.2  NEUTROABS 2.8  --   --   --   HGB 12.4*   < > 13.5 12.6*  HCT 39.6   < > 42.4 40.7  MCV 82.2   < > 81.5 84.1  PLT 324   < > 179 301   < > = values in this interval not displayed.    Basic Metabolic Panel:  Recent Labs  Lab 01/27/18 0459 01/27/18 1739  01/30/18 0251 01/31/18 0401  NA 139  --    < > 147* 148*  K 4.1  --    < > 4.3 4.2  CL 104  --    < > 111 113*  CO2 22  --    < > 25 26  GLUCOSE 155*  --    < > 145* 176*  BUN 40*  --    < > 38* 49*  CREATININE 2.64*  --    < > 1.61* 1.88*  CALCIUM 9.7  --    < > 9.6 9.5  MG 2.4 2.9*  --   --   --   PHOS 4.7* 5.0*  --   --   --    < > = values in this interval not displayed.    Lipid Panel:     Component Value Date/Time   CHOL 231 (H) 01/27/2018 0459   TRIG 142 01/30/2018 0251   HDL 49 01/27/2018 0459   CHOLHDL 4.7 01/27/2018 0459   VLDL 71 (H) 01/27/2018 0459   LDLCALC 111 (H) 01/27/2018 0459   HgbA1c:  Lab Results  Component Value Date   HGBA1C 6.0 (H) 01/27/2018   Urine Drug Screen:     Component Value Date/Time   LABOPIA NONE DETECTED 01/26/2018 1227   COCAINSCRNUR NONE DETECTED 01/26/2018 1227   LABBENZ NONE DETECTED 01/26/2018 1227   AMPHETMU NONE DETECTED 01/26/2018  1227   THCU NONE DETECTED 01/26/2018 1227   LABBARB (A) 01/26/2018 1227    Result not available. Reagent lot number recalled by manufacturer.    Alcohol Level     Component Value Date/Time   ETH <10 01/26/2018 0913    IMAGING I have personally reviewed the radiological images below and agree with the radiology interpretations.  Ct Angio Head and neck W Or Wo Contrast 01/26/2018 IMPRESSION:  1. No acute or focal vascular lesion to explain the patient's hemorrhage.  2. No definite enhancement or mass lesion.  3. Atherosclerotic changes involving the aortic arch and bilateral carotid bifurcations without a significant stenosis relative to the more distal vessels.  4. Cavernous atherosclerotic changes are much more mild, also without a significant  stenosis.  5. Left MCA branch vessels are displaced by the hemorrhagic mass.   Ct Head Wo Contrast 01/26/2018 IMPRESSION:  Unchanged size of large intraparenchymal hemorrhage centered in the left frontal white matter.  Unchanged 4 mm rightward midline shift.   Ct Head Code Stroke Wo Contrast 01/26/2018 IMPRESSION:  1. 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  2. Screening vasogenic edema and mass effect with 4 mm of midline shift and effacement of the left lateral ventricle.  3. Mild generalized white matter disease likely reflects the sequela of chronic microvascular ischemia   Transthoracic Echocardiogram  01/27/18 Study Conclusions  - Left ventricle: The cavity size was normal. Systolic function was   vigorous. The estimated ejection fraction was in the range of 65%   to 70%. Wall motion was normal; there were no regional wall   motion abnormalities. Left ventricular diastolic function   parameters were normal. - Aortic valve: Trileaflet; moderately thickened, moderately   calcified leaflets. Transvalvular velocity was within the normal   range. There was no stenosis. There was no  regurgitation. - Mitral valve: There was no regurgitation. - Right ventricle: The cavity size was normal. Wall thickness was   normal. Systolic function was normal. - Tricuspid valve: There was no regurgitation. - Pulmonary arteries: Systolic pressure could not be accurately   estimated.  Impressions:  - Limited study quality, hyperdynamic LVEF, aortic valve leaflets   are calcified, no stenosis, no source of embolism was seen.  CT Head Wo Contrast 01/28/2018 IMPRESSION: 1. No significant interval change in size and distribution of large intraparenchymal hemorrhage centered at the left frontal white matter. Similar localized regional mass effect with persistent 4 mm left-to-right shift. 2. Interval development of small volume intraventricular hemorrhage. No hydrocephalus or ventricular trapping. 3. No other new acute intracranial abnormality.   PHYSICAL EXAM  Temp:  [97.6 F (36.4 C)-98.6 F (37 C)] 97.6 F (36.4 C) (07/24 1100) Pulse Rate:  [77-95] 90 (07/24 0845) Resp:  [17] 17 (07/24 0350) BP: (136-187)/(80-97) 155/89 (07/24 0845) SpO2:  [97 %-98 %] 98 % (07/24 0845) Weight:  [135 lb 2.3 oz (61.3 kg)] 135 lb 2.3 oz (61.3 kg) (07/24 0600)  General - frail elderly Caucasian male in no apparent distress.  Ophthalmologic - fundi not visualized due to noncooperation.  Cardiovascular - Regular rate and rhythm.  Neuro -alert awake   following simple midline and left bodycommands, minimal speech output.and speaks only a few words. Marked expressive greater than receptive Global aphasia. Left gaze preference but able to cross midline to right intermittently. Blinking to visual threat on the left, but inconsistent to the right. PERRL. Right nasolabial fold flattening, tongue midline in mouth. LUE and LLE 4/5, spontaneous movement, but RUE and RLE flaccid even with pain stimulation. DTR 1+ and right babinski positive. Sensation, coordination and gait not  tested.    ASSESSMENT/PLAN [name] is a 72 y.o. male with history of hypertension, hyperlipidemia, nasopharyngeal cancer status post radiation, diabetes mellitus, chronic kidney disease, anxiety and claustrophobia presenting right-sided flaccidity visual deficits, and bradycardia. He did not receive IV t-PA due to [place].  ICH: left hemisphere large hematoma, likely due to hypertension. 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 4 mm rightward midline shift.   MRI head - not performed secondary to bradycardia.  CTA H&N - no source of  bleeding identified. No enhancement  CT repeat showed stable hematoma  Repeat CT - no significant change other than interval development of small volume intraventricular hemorrhage.  2D Echo - EF 65 to 70%.  No cardiac source of emboli identified.  LDL - 111  HgbA1c - 6.2  VTE prophylaxis - SCDs Diet Order           Diet NPO time specified  Diet effective now          No antithrombotic prior to admission, now on No antithrombotic.  Ongoing aggressive stroke risk factor management  Therapy recommendations:  CLR Disposition:  CLR Hypertension  Stable on Cleviprex . SBP goal < 180 mmHG . Wean off cleviprex as able . Resumed home meds. Will add prn labetalol and hydralazine and wean cleviprex . Long-term BP goal normotensive  Hyperlipidemia  Lipid lowering medication PTA: Fenofibrate  LDL 111, goal < 70  Current lipid lowering medication: none  Consider statin once po access  Dysphagia   Baseline dysphagia due to radiation in the area  On IVF @ 49  Other Stroke Risk Factors  Advanced age  Former cigarette smoker - quit  Other Active Problems  CKD - nephrology follow as outpt Cre 2.50->2.80->2.64 -> 2.85- 1.88 continue gentle hydration  Nasopharyngeal cancer s/p radiation - following with ENT - last seen  08/2017 - no recurrent for 3 years.  Leukocytosis - WBCs 12.2 - monitor - afebrile  PLAN   continue blood pressure control and therapy evaluation. Transfer to inpatient rehabilitation when bed available  Speech evaluating.  Continue cortrak.  Monitor renal function   Hospital day # 6   Mobilize out of bed. Await rehabilitation bed likely in the next 1-2 days IV hydration with NS Medically stable to transfer to rehabilitation if bed available. No family at the bedside today    Antony Contras, MD Medical Director [place] Pager: [phone] 02/01/2018 1:56 PM   To contact Stroke Continuity provider, please refer to [email or web address] After hours, contact General Neurology

## 2018-02-01 NOTE — Progress Notes (Signed)
  Speech Language Pathology Treatment: Dysphagia;Cognitive-Linquistic  Patient Details Name: Adam Yang MRN: [id number] DOB: 1946/04/11 Today's Date: 02/01/2018 Time: 1203-1220 SLP Time Calculation (min) (ACUTE ONLY): 17 min  Assessment / Plan / Recommendation Clinical Impression  Pt was seen for skilled ST targeting goals for dysphagia and communication.  Pt was drowsy but pleasant and agreeable to participating in therapies.  SLP provided thorough oral care via suction toothette to minimize bacterial load.  Pt consumed 2 teaspoons of room temperature thin liquids with immediate coughing on 50% of trials.  Pt indicated wanting to cease trials after coughing episode.  Continue to recommend long term alternative means of nutrition if this is within the family's wishes.  Family not present during treatment today.  Pt was able to produce several vowel and consonants both in isolation and in simple CV syllables with max cues to recognize and correct verbal errors.  Targets included I, Ay, M, May, My, Me, Moo.  Pt remains unable to verbalize to convey simple biographical information.  ST will continue to follow to address communication and dysphagia.  Pt left in recliner with needs within reach.  Continue per current plan of care.    HPI HPI: [name] is a 72 y.o. male with history of hypertension, hyperlipidemia, nasopharyngeal cancer status post radiation, diabetes mellitus, chronic kidney disease, anxiety and claustrophobia presenting right-sided flaccidity visual deficits, and bradycardia. 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 Unchanged 4 mm rightward midline shift.  Esophagram on 01/27/18 showed normal pharyngeal function, tertiary contractions consistent with esophageal dysmotility.       SLP Plan  Continue with current plan of care       Recommendations  Diet recommendations: NPO Medication Administration: Via alternative means                 Oral Care Recommendations: Oral care QID Follow up Recommendations: Inpatient Rehab SLP Visit Diagnosis: Dysphagia, oropharyngeal phase (R13.12);Dysphagia, pharyngoesophageal phase (R13.14) Plan: Continue with current plan of care       GO                PageSelinda Yang 02/01/2018, 12:24 PM

## 2018-02-01 NOTE — Progress Notes (Signed)
Inpatient Rehabilitation  I have received authorization for an IP Rehab admission.  Hopeful for bed availability and offer tomorrow.  Will confirm with the team in the morning.  Discussed with nurse case manager, patient, and family.  Call if questions.   Carmelia Roller., CCC/SLP Admission Coordinator  [place]  Cell [phone]

## 2018-02-01 NOTE — Progress Notes (Addendum)
Called regarding precipitous drop in BP following hydralazine. He went from the 003K to 91P systolic. This was accompanied by  unresponsiveness.   IV fluid bolus was started and his BP did respond with increase to the 140s with subsequent improvement in mental status.   Given this response, I am discontinuing hydralazine, will use labetalol as needed for high BP.  Also with history of DM, will start [place], MD [facility] [phone]  If 7pm- 7am, please page neurology on call as listed in [place].

## 2018-02-01 NOTE — Progress Notes (Signed)
Physical Therapy Treatment Patient Details Name: Adam Yang MRN: [id number] DOB: 04-Feb-1946 Today's Date: 02/01/2018    History of Present Illness [name] is a 72 y.o. male with history of hypertension, hyperlipidemia, nasopharyngeal cancer status post radiation, diabetes mellitus, chronic kidney disease, anxiety and claustrophobia presenting right-sided flaccidity visual deficits, and bradycardia. 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 Unchanged 4 mm rightward midline shift.     PT Comments    Patient pleasant and agreeable to participate in therapy. This session focused grossly on sitting and standing balance as well as functional transfers. Pt requires mod/max A +2 for transfers and +1 for sitting balance. Visual cues added to this session to work on midline posture. Continue to recommend CIR for further skilled PT services to maximize independence and safety with mobility.    Follow Up Recommendations  CIR     Equipment Recommendations  Other (comment)(TBD next venue)    Recommendations for Other Services Rehab consult     Precautions / Restrictions Precautions Precautions: Fall Restrictions Weight Bearing Restrictions: No    Mobility  Bed Mobility Overal bed mobility: Needs Assistance Bed Mobility: Supine to Sit     Supine to sit: Max assist     General bed mobility comments: pt able to bring L LE to EOB and assist to elevate trunk with L UE; use of rail to roll; assistance required to bring R LE and hips to EOB and to elevate trunk into sitting   Transfers Overall transfer level: Needs assistance Equipment used: 2 person hand held assist(use of gait belt ) Transfers: Sit to/from [employer] Transfers Sit to Stand: +2 physical assistance;Mod assist;Max assist Stand pivot transfers: Max assist;+2 physical assistance       General transfer comment: pt requires assistance to power up into standing and to  maintain balance upon standing with R knee blocked and facilitation at pelvis and trunk for upright posture and weight shifting for pivot to recliner; completed additional sit<>stand from recliner using LUE support on chair placed in front of pt, completing with mod-maxA+2   Ambulation/Gait                 Stairs             Wheelchair Mobility    Modified Rankin (Stroke Patients Only) Modified Rankin (Stroke Patients Only) Pre-Morbid Rankin Score: No symptoms Modified Rankin: Severe disability     Balance Overall balance assessment: Needs assistance   Sitting balance-Leahy Scale: Poor Sitting balance - Comments: pushes to R with stronger LUE     Standing balance-Leahy Scale: Zero                              Cognition Arousal/Alertness: Awake/alert Behavior During Therapy: WFL for tasks assessed/performed Overall Cognitive Status: Difficult to assess                                 General Comments: More consistently following verbal one step commands; min attempts at answering questions      Exercises      General Comments General comments (skin integrity, edema, etc.): noted decreased BP once seated in recliner, applied SCDs and semi-reclined pt with BP returning to WNL       Pertinent Vitals/Pain Pain Assessment: No/denies pain    Home Living  Prior Function            PT Goals (current goals can now be found in the care plan section) Acute Rehab PT Goals Patient Stated Goal: no goal stated, but agreeable to work with PT/OT Progress towards PT goals: Progressing toward goals    Frequency    Min 4X/week      PT Plan Current plan remains appropriate    Co-evaluation PT/OT/SLP Co-Evaluation/Treatment: Yes Reason for Co-Treatment: For patient/therapist safety;To address functional/ADL transfers PT goals addressed during session: Mobility/safety with  mobility;Balance;Strengthening/ROM OT goals addressed during session: ADL's and self-care;Strengthening/ROM      AM-PAC PT "6 Clicks" Daily Activity  Outcome Measure  Difficulty turning over in bed (including adjusting bedclothes, sheets and blankets)?: Unable Difficulty moving from lying on back to sitting on the side of the bed? : Unable Difficulty sitting down on and standing up from a chair with arms (e.g., wheelchair, bedside commode, etc,.)?: Unable Help needed moving to and from a bed to chair (including a wheelchair)?: A Lot Help needed walking in hospital room?: Total Help needed climbing 3-5 steps with a railing? : Total 6 Click Score: 7    End of Session Equipment Utilized During Treatment: Gait belt Activity Tolerance: Patient tolerated treatment well Patient left: with call bell/phone within reach;in chair;with chair alarm set Nurse Communication: Mobility status;Other (comment)(orthostatic BP) PT Visit Diagnosis: Unsteadiness on feet (R26.81);Hemiplegia and hemiparesis Hemiplegia - Right/Left: Right Hemiplegia - dominant/non-dominant: Non-dominant Hemiplegia - caused by: Nontraumatic intracerebral hemorrhage     Time: 1012-1100 PT Time Calculation (min) (ACUTE ONLY): 48 min  Charges:  $Therapeutic Activity: 8-22 mins $Neuromuscular Re-education: 8-22 mins                    G Codes:       Earney Navy, PTA Pager: [phone]     Darliss Cheney 02/01/2018, 12:14 PM

## 2018-02-01 NOTE — Progress Notes (Signed)
Inpatient Rehabilitation  I await insurance authorization for a hopeful IP Rehab admission.  Given limited bed availability today may not be able to offer a bed until 02/02/18.  Plan to update team as I know.  Call if questions.  Carmelia Roller., CCC/SLP Admission Coordinator  [place]  Cell [phone]

## 2018-02-01 NOTE — Progress Notes (Signed)
Occupational Therapy Treatment Patient Details Name: Adam Yang MRN: [id number] DOB: 07-19-1945 Today's Date: 02/01/2018    History of present illness [name] is a 72 y.o. male with history of hypertension, hyperlipidemia, nasopharyngeal cancer status post radiation, diabetes mellitus, chronic kidney disease, anxiety and claustrophobia presenting right-sided flaccidity visual deficits, and bradycardia. 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 Unchanged 4 mm rightward midline shift.    OT comments  Pt making progress towards OT goals. Focus of session on sitting and standing balance while maintaining midline position. Utilized Geologist, engineering during session for additional visual feedback during activity completion. Pt requiring mod-maxA for static sitting balance, intermittently able to maintain with minA given verbal/tactile cues. Pt requiring mod-maxA+2 for sit<>stands, maxA+2 for stand pivot transfer and to maintain static standing balance using LUE support and with RLE blocked. Pt fatigued with activity but demonstrates good motivation and is very willing to work and progress with therapy. Feel he remains an excellent candidate for CIR level services at time of discharge. Will continue to follow acutely to progress pt towards established OT goals.    Follow Up Recommendations  CIR    Equipment Recommendations  Wheelchair (measurements OT);Wheelchair cushion (measurements OT);3 in 1 bedside commode          Precautions / Restrictions Precautions Precautions: Fall Restrictions Weight Bearing Restrictions: No       Mobility Bed Mobility Overal bed mobility: Needs Assistance Bed Mobility: Supine to Sit     Supine to sit: Max assist     General bed mobility comments: pt able to bring L LE to EOB and assist to elevate trunk with L UE; use of rail to roll; assistance required to bring R LE and hips to EOB and to elevate trunk into sitting    Transfers Overall transfer level: Needs assistance Equipment used: 2 person hand held assist(use of gait belt ) Transfers: Sit to/from [employer] Transfers Sit to Stand: +2 physical assistance;Mod assist;Max assist Stand pivot transfers: Max assist;+2 physical assistance       General transfer comment: pt requires assistance to power up into standing and to maintain balance upon standing with R knee blocked and facilitation at pelvis and trunk for upright posture and weight shifting for pivot to recliner; completed additional sit<>stand from recliner using LUE support on chair placed in front of pt, completing with mod-maxA+2     Balance Overall balance assessment: Needs assistance   Sitting balance-Leahy Scale: Poor Sitting balance - Comments: pushes to R with stronger LUE     Standing balance-Leahy Scale: Zero                             ADL either performed or assessed with clinical judgement   ADL Overall ADL's : Needs assistance/impaired Eating/Feeding: NPO                                   Functional mobility during ADLs: Maximal assistance;+2 for physical assistance;+2 for safety/equipment General ADL Comments: focus of session on sitting and standing balance and maintaining midline position; pt requiring up to maxA for static sitting balance, sitting EOB approx 7-8 min. Transferred to recliner via stand pivot with maxA+2. Additional practice on sit<>stand and standing balance using LUE support on regular chair and pt requiring maxA+2 to maintain static standing balance; utilized mirror for visual feedback during sitting and  standing activity      Vision Baseline Vision/History: Wears glasses Wears Glasses: At all times Additional Comments: pt requiring cues to track towards midline and to look up at mirror for use of visual feed back, tends to maintain gaze L; improved ability to maintain gaze forward in standing (compared to sitting)  given max cues, only able to maintain gaze forward for brief period of time    Perception     Praxis      Cognition Arousal/Alertness: Awake/alert Behavior During Therapy: WFL for tasks assessed/performed Overall Cognitive Status: Difficult to assess                                 General Comments: More consistently following verbal one step commands; min attempts at answering questions        Exercises     Shoulder Instructions       General Comments noted decreased BP once seated in recliner, applied SCDs and semi-reclined pt with BP returning to WNL     Pertinent Vitals/ Pain       Pain Assessment: No/denies pain  Home Living                                          Prior Functioning/Environment              Frequency  Min 3X/week        Progress Toward Goals  OT Goals(current goals can now be found in the care plan section)  Progress towards OT goals: Progressing toward goals  Acute Rehab OT Goals Patient Stated Goal: no goal stated, but agreeable to work with PT/OT OT Goal Formulation: Patient unable to participate in goal setting Time For Goal Achievement: 02/10/18 Potential to Achieve Goals: Good  Plan Discharge plan remains appropriate;Frequency remains appropriate    Co-evaluation    PT/OT/SLP Co-Evaluation/Treatment: Yes Reason for Co-Treatment: For patient/therapist safety;To address functional/ADL transfers PT goals addressed during session: Mobility/safety with mobility;Balance;Strengthening/ROM OT goals addressed during session: ADL's and self-care;Strengthening/ROM      AM-PAC PT "6 Clicks" Daily Activity     Outcome Measure   Help from another person eating meals?: Total Help from another person taking care of personal grooming?: A Lot Help from another person toileting, which includes using toliet, bedpan, or urinal?: A Lot Help from another person bathing (including washing, rinsing, drying)?: A  Lot Help from another person to put on and taking off regular upper body clothing?: A Lot Help from another person to put on and taking off regular lower body clothing?: Total 6 Click Score: 10    End of Session Equipment Utilized During Treatment: Gait belt  OT Visit Diagnosis: Unsteadiness on feet (R26.81);Muscle weakness (generalized) (M62.81)   Activity Tolerance Patient tolerated treatment well   Patient Left with call bell/phone within reach;with chair alarm set;in chair   Nurse Communication Mobility status;Other (comment)(BP )        Time: 1012-1100 OT Time Calculation (min): 48 min  Charges: OT General Charges $OT Visit: 1 Visit OT Treatments $Therapeutic Activity: 8-22 mins  Lou Cal, [place] Pager [phone] 02/01/2018    Raymondo Band 02/01/2018, 11:59 AM

## 2018-02-02 ENCOUNTER — Inpatient Hospital Stay (HOSPITAL_COMMUNITY)
Admission: RE | Admit: 2018-02-02 | Discharge: 2018-02-23 | DRG: 056 | Disposition: A | Discharge status: Other Acute Inpatient Hospital | Payer: Medicare Other | Source: Intra-hospital | Attending: Physical Medicine & Rehabilitation | Admitting: Physical Medicine & Rehabilitation

## 2018-02-02 ENCOUNTER — Encounter (HOSPITAL_COMMUNITY): Payer: Self-pay | Admitting: *Deleted

## 2018-02-02 DIAGNOSIS — E440 Moderate protein-calorie malnutrition: Secondary | ICD-10-CM | POA: Diagnosis not present

## 2018-02-02 DIAGNOSIS — F329 Major depressive disorder, single episode, unspecified: Secondary | ICD-10-CM | POA: Diagnosis not present

## 2018-02-02 DIAGNOSIS — I69392 Facial weakness following cerebral infarction: Secondary | ICD-10-CM

## 2018-02-02 DIAGNOSIS — Z87891 Personal history of nicotine dependence: Secondary | ICD-10-CM | POA: Diagnosis not present

## 2018-02-02 DIAGNOSIS — Z681 Body mass index (BMI) 19 or less, adult: Secondary | ICD-10-CM | POA: Diagnosis not present

## 2018-02-02 DIAGNOSIS — E1122 Type 2 diabetes mellitus with diabetic chronic kidney disease: Secondary | ICD-10-CM | POA: Diagnosis not present

## 2018-02-02 DIAGNOSIS — Z85818 Personal history of malignant neoplasm of other sites of lip, oral cavity, and pharynx: Secondary | ICD-10-CM

## 2018-02-02 DIAGNOSIS — R627 Adult failure to thrive: Secondary | ICD-10-CM | POA: Diagnosis present

## 2018-02-02 DIAGNOSIS — I129 Hypertensive chronic kidney disease with stage 1 through stage 4 chronic kidney disease, or unspecified chronic kidney disease: Secondary | ICD-10-CM | POA: Diagnosis present

## 2018-02-02 DIAGNOSIS — R4702 Dysphasia: Secondary | ICD-10-CM | POA: Diagnosis not present

## 2018-02-02 DIAGNOSIS — E785 Hyperlipidemia, unspecified: Secondary | ICD-10-CM | POA: Diagnosis present

## 2018-02-02 DIAGNOSIS — E870 Hyperosmolality and hypernatremia: Secondary | ICD-10-CM | POA: Diagnosis not present

## 2018-02-02 DIAGNOSIS — J690 Pneumonitis due to inhalation of food and vomit: Secondary | ICD-10-CM | POA: Diagnosis not present

## 2018-02-02 DIAGNOSIS — I69320 Aphasia following cerebral infarction: Secondary | ICD-10-CM

## 2018-02-02 DIAGNOSIS — Z515 Encounter for palliative care: Secondary | ICD-10-CM | POA: Diagnosis not present

## 2018-02-02 DIAGNOSIS — R740 Nonspecific elevation of levels of transaminase and lactic acid dehydrogenase [LDH]: Secondary | ICD-10-CM

## 2018-02-02 DIAGNOSIS — J189 Pneumonia, unspecified organism: Secondary | ICD-10-CM

## 2018-02-02 DIAGNOSIS — R0989 Other specified symptoms and signs involving the circulatory and respiratory systems: Secondary | ICD-10-CM

## 2018-02-02 DIAGNOSIS — M1990 Unspecified osteoarthritis, unspecified site: Secondary | ICD-10-CM | POA: Diagnosis present

## 2018-02-02 DIAGNOSIS — I619 Nontraumatic intracerebral hemorrhage, unspecified: Secondary | ICD-10-CM | POA: Diagnosis not present

## 2018-02-02 DIAGNOSIS — Z808 Family history of malignant neoplasm of other organs or systems: Secondary | ICD-10-CM | POA: Diagnosis not present

## 2018-02-02 DIAGNOSIS — I10 Essential (primary) hypertension: Secondary | ICD-10-CM | POA: Diagnosis not present

## 2018-02-02 DIAGNOSIS — N183 Chronic kidney disease, stage 3 (moderate): Secondary | ICD-10-CM | POA: Diagnosis present

## 2018-02-02 DIAGNOSIS — J9601 Acute respiratory failure with hypoxia: Secondary | ICD-10-CM | POA: Diagnosis not present

## 2018-02-02 DIAGNOSIS — R6889 Other general symptoms and signs: Secondary | ICD-10-CM | POA: Diagnosis not present

## 2018-02-02 DIAGNOSIS — Z801 Family history of malignant neoplasm of trachea, bronchus and lung: Secondary | ICD-10-CM

## 2018-02-02 DIAGNOSIS — D631 Anemia in chronic kidney disease: Secondary | ICD-10-CM | POA: Diagnosis present

## 2018-02-02 DIAGNOSIS — R059 Cough, unspecified: Secondary | ICD-10-CM

## 2018-02-02 DIAGNOSIS — I69351 Hemiplegia and hemiparesis following cerebral infarction affecting right dominant side: Principal | ICD-10-CM

## 2018-02-02 DIAGNOSIS — R1310 Dysphagia, unspecified: Secondary | ICD-10-CM | POA: Diagnosis present

## 2018-02-02 DIAGNOSIS — I951 Orthostatic hypotension: Secondary | ICD-10-CM | POA: Diagnosis not present

## 2018-02-02 DIAGNOSIS — N179 Acute kidney failure, unspecified: Secondary | ICD-10-CM | POA: Diagnosis not present

## 2018-02-02 DIAGNOSIS — Z923 Personal history of irradiation: Secondary | ICD-10-CM | POA: Diagnosis not present

## 2018-02-02 DIAGNOSIS — D638 Anemia in other chronic diseases classified elsewhere: Secondary | ICD-10-CM

## 2018-02-02 DIAGNOSIS — R7401 Elevation of levels of liver transaminase levels: Secondary | ICD-10-CM

## 2018-02-02 DIAGNOSIS — R05 Cough: Secondary | ICD-10-CM

## 2018-02-02 DIAGNOSIS — M109 Gout, unspecified: Secondary | ICD-10-CM | POA: Diagnosis present

## 2018-02-02 DIAGNOSIS — K8020 Calculus of gallbladder without cholecystitis without obstruction: Secondary | ICD-10-CM | POA: Diagnosis not present

## 2018-02-02 DIAGNOSIS — I161 Hypertensive emergency: Secondary | ICD-10-CM | POA: Diagnosis present

## 2018-02-02 DIAGNOSIS — Z66 Do not resuscitate: Secondary | ICD-10-CM | POA: Diagnosis present

## 2018-02-02 DIAGNOSIS — Z7189 Other specified counseling: Secondary | ICD-10-CM | POA: Diagnosis not present

## 2018-02-02 DIAGNOSIS — I69391 Dysphagia following cerebral infarction: Secondary | ICD-10-CM | POA: Diagnosis not present

## 2018-02-02 DIAGNOSIS — N289 Disorder of kidney and ureter, unspecified: Secondary | ICD-10-CM | POA: Diagnosis not present

## 2018-02-02 DIAGNOSIS — H04123 Dry eye syndrome of bilateral lacrimal glands: Secondary | ICD-10-CM

## 2018-02-02 LAB — GLUCOSE, CAPILLARY
GLUCOSE-CAPILLARY: 170 mg/dL — AB (ref 70–99)
GLUCOSE-CAPILLARY: 162 mg/dL — AB (ref 70–99)
Glucose-Capillary: 140 mg/dL — ABNORMAL HIGH (ref 70–99)
Glucose-Capillary: 149 mg/dL — ABNORMAL HIGH (ref 70–99)
Glucose-Capillary: 104 mg/dL — ABNORMAL HIGH (ref 70–99)

## 2018-02-02 LAB — BASIC METABOLIC PANEL
Anion gap: 9 (ref 5–15)
BUN: 52 mg/dL — ABNORMAL HIGH (ref 8–23)
CHLORIDE: 119 mmol/L — AB (ref 98–111)
CO2: 25 mmol/L (ref 22–32)
Calcium: 9.1 mg/dL (ref 8.9–10.3)
Creatinine, Ser: 1.98 mg/dL — ABNORMAL HIGH (ref 0.61–1.24)
GFR calc non Af Amer: 32 mL/min — ABNORMAL LOW (ref >60)
GFR, EST AFRICAN AMERICAN: 38 mL/min — AB (ref >60)
Glucose, Bld: 190 mg/dL — ABNORMAL HIGH (ref 70–99)
POTASSIUM: 4.3 mmol/L (ref 3.5–5.1)
SODIUM: 153 mmol/L — AB (ref 135–145)

## 2018-02-02 LAB — TRIGLYCERIDES: Triglycerides: 133 mg/dL (ref <150)

## 2018-02-02 MED ORDER — JEVITY 1.2 CAL PO LIQD
1000.0000 mL | ORAL | Status: DC
  Administered 2018-02-02: 1000 mL
  Filled 2018-02-02 (×2): qty 237
  Filled 2018-02-02 (×2): qty 1000

## 2018-02-02 MED ORDER — FREE WATER
200.0000 mL | Freq: Four times a day (QID) | Status: DC
  Administered 2018-02-03 – 2018-02-04 (×5): 200 mL

## 2018-02-02 MED ORDER — LISINOPRIL 20 MG PO TABS
20.0000 mg | ORAL_TABLET | Freq: Every day | ORAL | Status: DC
  Administered 2018-02-02 – 2018-02-11 (×10): 20 mg via ORAL
  Filled 2018-02-02 (×11): qty 1

## 2018-02-02 MED ORDER — SENNOSIDES-DOCUSATE SODIUM 8.6-50 MG PO TABS: 1.0000 | ORAL_TABLET | Freq: Two times a day (BID) | ORAL | Status: DC

## 2018-02-02 MED ORDER — INSULIN ASPART 100 UNIT/ML ~~LOC~~ SOLN
0.0000 [IU] | SUBCUTANEOUS | Status: DC
Start: 2018-02-02 — End: 2018-02-06
  Administered 2018-02-03 (×3): 3 [IU] via SUBCUTANEOUS
  Administered 2018-02-04 – 2018-02-05 (×4): 2 [IU] via SUBCUTANEOUS
  Administered 2018-02-05: 3 [IU] via SUBCUTANEOUS
  Administered 2018-02-05 – 2018-02-06 (×4): 2 [IU] via SUBCUTANEOUS

## 2018-02-02 MED ORDER — AMLODIPINE BESYLATE 10 MG PO TABS: 10.0000 mg | ORAL_TABLET | Freq: Every day | ORAL | Status: DC

## 2018-02-02 MED ORDER — ATORVASTATIN CALCIUM 20 MG PO TABS: 20.0000 mg | ORAL_TABLET | Freq: Every day | ORAL | Status: DC

## 2018-02-02 MED ORDER — NYSTATIN 100000 UNIT/ML MT SUSP: 5.0000 mL | Freq: Four times a day (QID) | OROMUCOSAL | 0 refills | Status: DC

## 2018-02-02 MED ORDER — ADULT MULTIVITAMIN LIQUID CH
15.0000 mL | Freq: Every day | ORAL | Status: DC
  Administered 2018-02-03 – 2018-02-23 (×20): 15 mL
  Filled 2018-02-02 (×23): qty 15

## 2018-02-02 MED ORDER — ARTIFICIAL TEARS OPHTHALMIC OINT: TOPICAL_OINTMENT | OPHTHALMIC | Status: DC | PRN

## 2018-02-02 MED ORDER — PRO-STAT SUGAR FREE PO LIQD
30.0000 mL | Freq: Every day | ORAL | Status: DC
  Administered 2018-02-03: 30 mL
  Filled 2018-02-02: qty 30

## 2018-02-02 MED ORDER — SODIUM CHLORIDE 0.9 % IV SOLN: 500.0000 mL | INTRAVENOUS | 0 refills | Status: DC

## 2018-02-02 MED ORDER — SENNOSIDES-DOCUSATE SODIUM 8.6-50 MG PO TABS
1.0000 | ORAL_TABLET | Freq: Two times a day (BID) | ORAL | Status: DC
  Administered 2018-02-02 – 2018-02-09 (×13): 1
  Filled 2018-02-02 (×13): qty 1

## 2018-02-02 MED ORDER — LISINOPRIL 20 MG PO TABS: 20.0000 mg | ORAL_TABLET | Freq: Every day | ORAL | Status: DC

## 2018-02-02 MED ORDER — LABETALOL HCL 5 MG/ML IV SOLN: 10.0000 mg | INTRAVENOUS | Status: DC | PRN

## 2018-02-02 MED ORDER — JEVITY 1.2 CAL PO LIQD: 1000.0000 mL | ORAL | 0 refills | Status: DC

## 2018-02-02 MED ORDER — AMLODIPINE BESYLATE 10 MG PO TABS
10.0000 mg | ORAL_TABLET | Freq: Every day | ORAL | Status: DC
  Administered 2018-02-03 – 2018-02-23 (×21): 10 mg via ORAL
  Filled 2018-02-02 (×22): qty 1

## 2018-02-02 MED ORDER — TIZANIDINE HCL 4 MG PO TABS
4.0000 mg | ORAL_TABLET | Freq: Every day | ORAL | Status: DC
  Administered 2018-02-02 – 2018-02-08 (×7): 4 mg
  Filled 2018-02-02 (×7): qty 1

## 2018-02-02 MED ORDER — CHLORHEXIDINE GLUCONATE 0.12 % MT SOLN: 15.0000 mL | Freq: Two times a day (BID) | OROMUCOSAL | 0 refills | Status: DC

## 2018-02-02 MED ORDER — ORAL CARE MOUTH RINSE: 15.0000 mL | OROMUCOSAL | 0 refills | Status: DC

## 2018-02-02 MED ORDER — ARTIFICIAL TEARS OPHTHALMIC OINT
TOPICAL_OINTMENT | OPHTHALMIC | Status: DC | PRN
Start: 2018-02-02 — End: 2018-02-23

## 2018-02-02 MED ORDER — FENOFIBRATE 160 MG PO TABS
160.0000 mg | ORAL_TABLET | Freq: Every day | ORAL | Status: DC
  Administered 2018-02-03 – 2018-02-17 (×15): 160 mg via ORAL
  Filled 2018-02-02 (×16): qty 1

## 2018-02-02 MED ORDER — ADULT MULTIVITAMIN LIQUID CH: 15.0000 mL | Freq: Every day | ORAL | Status: DC

## 2018-02-02 MED ORDER — PRO-STAT SUGAR FREE PO LIQD: 30.0000 mL | Freq: Every day | ORAL | 0 refills | Status: DC

## 2018-02-02 MED ORDER — POLYETHYLENE GLYCOL 3350 17 G PO PACK
17.0000 g | PACK | Freq: Every day | ORAL | Status: DC
  Administered 2018-02-03 – 2018-02-13 (×11): 17 g
  Filled 2018-02-02 (×12): qty 1

## 2018-02-02 MED ORDER — POLYETHYLENE GLYCOL 3350 17 G PO PACK: 17.0000 g | PACK | Freq: Every day | ORAL | 0 refills | Status: DC

## 2018-02-02 MED ORDER — ATORVASTATIN CALCIUM 10 MG PO TABS: 20.0000 mg | ORAL_TABLET | Freq: Every day | ORAL | Status: DC

## 2018-02-02 MED ORDER — SODIUM CHLORIDE 0.9 % IV BOLUS
500.0000 mL | Freq: Once | INTRAVENOUS | Status: AC
  Administered 2018-02-01: 500 mL via INTRAVENOUS

## 2018-02-02 MED ORDER — INSULIN ASPART 100 UNIT/ML ~~LOC~~ SOLN
0.0000 [IU] | SUBCUTANEOUS | Status: DC
  Administered 2018-02-02 (×2): 2 [IU] via SUBCUTANEOUS
  Administered 2018-02-02: 3 [IU] via SUBCUTANEOUS

## 2018-02-02 MED ORDER — INSULIN ASPART 100 UNIT/ML ~~LOC~~ SOLN: 0.0000 [IU] | SUBCUTANEOUS | 11 refills | Status: DC

## 2018-02-02 NOTE — Progress Notes (Signed)
Inpatient Diabetes Program Recommendations  AACE/ADA: New Consensus Statement on Inpatient Glycemic Control (2015)  Target Ranges:  Prepandial:   less than 140 mg/dL      Peak postprandial:   less than 180 mg/dL (1-2 hours)      Critically ill patients:  140 - 180 mg/dL   Lab Results  Component Value Date   GLUCAP 162 (H) 02/02/2018   HGBA1C 6.0 (H) 01/27/2018    Review of Glycemic Control  Pt's wife states she doesn't understand why pt is receiving insulin. States he was not on insulin at home and HgbA1C is 6.0%. Thinks TF is causing blood sugar to increase. Has used Glucerna at home in the past. Wife states, "No one explained to me why he is getting insulin."  This Coordinator explained Novolog correction scale and importance of blood sugars being within goal of 140-180 mg/dL in the hospital. Stated Novolog correction was keeping his blood sugars very well controlled which is important for his kidneys and overall health. Explained to pt's wife that she could refuse his insulin if she wanted to. She then walked off.  Blood sugars within past 24 hours - 140-189 mg/dL. Will follow.  Thank you. Lorenda Peck, RD, LDN, CDE Inpatient Diabetes Coordinator [phone]

## 2018-02-02 NOTE — Progress Notes (Signed)
Vs stable neuro status back to baseline shift assessment. occasional  Apneic alarm noted on the monitor  However no acute distress observed. Tolerating TF well.  RNWill continue to monitor .Marland Kitchen

## 2018-02-02 NOTE — Progress Notes (Signed)
Patient discharge to 4W21 (CIR). Family aware of disposition. No belongings at bedside at this time. Report given.     Ave Filter, RN

## 2018-02-02 NOTE — Significant Event (Addendum)
Rapid Response Event Note  Overview: Time Called: 2311 Arrival Time: 2314 Event Type: Hypotension, Neurologic  Initial Focused Assessment: Upon arrival pt unresponsive in bed with HR 92, BP 76/48, spO2 97% on RA, RR 14. Bedside RN states that pt was hypertensive 188/100 after receiving oral care and adjusted in the bed. 20mg  hydralazine given by bedside RN for hypertension, BP 75min later 68/39 and pt not responding.   Interventions: Dr Leonel Ramsay paged and at bedside. Ordered to bolus fluids 53ml. 20g PIV started in RAC. Pt responded to fluid and BP improved to 149/73 with increased mental status. Pt now opening eyes to voice and stimulation along with moving L side (baseline post stroke)  Plan of Care (if not transferred): Continue to monitor pt BP q61min until stabilized following bolus completion. Hydralazine d/c'd by Md.   RN instructed to call with any questions or concerns  Event Summary: Name of Physician Notified: Dr Tobias Dakari Cregger at 2320  Outcome: Stayed in room and stabalized   Event End time: 2355  Sherilyn Dacosta

## 2018-02-02 NOTE — Progress Notes (Signed)
Physical Therapy Treatment Patient Details Name: Adam Yang MRN: [id number] DOB: 1946/04/20 Today's Date: 02/02/2018    History of Present Illness [name] is a 72 y.o. male with history of hypertension, hyperlipidemia, nasopharyngeal cancer status post radiation, diabetes mellitus, chronic kidney disease, anxiety and claustrophobia presenting right-sided flaccidity visual deficits, and bradycardia. 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 Unchanged 4 mm rightward midline shift.     PT Comments    Patient seen for mobility progression and this session focused on activity tolerance and sitting and standing balance. Pt with limited tolerance to standing due to orthostatic hypotension. See BPs below in general comments. Continue to progress as tolerated and recommend CIR for further skilled PT services.    Follow Up Recommendations  CIR     Equipment Recommendations  Other (comment)(TBD next venue)    Recommendations for Other Services Rehab consult     Precautions / Restrictions Precautions Precautions: Fall Restrictions Weight Bearing Restrictions: No    Mobility  Bed Mobility Overal bed mobility: Needs Assistance Bed Mobility: Supine to Sit     Supine to sit: Max assist;+2 for physical assistance     General bed mobility comments: multimodal cues for sequencing; assistance required for bringing bilat LE and hips to EOB and to elevate trunk into sitting; use of rail  Transfers Overall transfer level: Needs assistance Equipment used: 2 person hand held assist(use of gait belt ) Transfers: Sit to/from Stand;Stand Pivot Transfers Sit to Stand: +2 physical assistance;Max assist Stand pivot transfers: Max assist;+2 physical assistance       General transfer comment: assistance required to stabilize R LE and support R UE as well as balance and weight shifting; multimodal cues required for initiation of pivotal steps and  sequencing  Ambulation/Gait                 Stairs             Wheelchair Mobility    Modified Rankin (Stroke Patients Only) Modified Rankin (Stroke Patients Only) Pre-Morbid Rankin Score: No symptoms Modified Rankin: Severe disability     Balance Overall balance assessment: Needs assistance   Sitting balance-Leahy Scale: Poor Sitting balance - Comments: pushes to R with stronger LUE     Standing balance-Leahy Scale: Zero Standing balance comment: worked on standing balance with R knee blocked and hip extension faciliated at pelvis; standing trial limited by orthostasis                            Cognition Arousal/Alertness: Awake/alert Behavior During Therapy: WFL for tasks assessed/performed Overall Cognitive Status: Difficult to assess                                 General Comments: pt grossly answering questions with "no" or "yes" and inconsistent       Exercises      General Comments General comments (skin integrity, edema, etc.): BP supine 165/88, BP with inital sitting EOB 163/77, in standing 114/79 and then sitting end of session 111/65      Pertinent Vitals/Pain Pain Assessment: Faces Faces Pain Scale: No hurt    Home Living                      Prior Function Level of Independence: Independent          PT Goals (current  goals can now be found in the care plan section) Acute Rehab PT Goals PT Goal Formulation: Patient unable to participate in goal setting Time For Goal Achievement: 02/10/18 Potential to Achieve Goals: Good Progress towards PT goals: Progressing toward goals    Frequency    Min 4X/week      PT Plan Current plan remains appropriate    Co-evaluation              AM-PAC PT "6 Clicks" Daily Activity  Outcome Measure  Difficulty turning over in bed (including adjusting bedclothes, sheets and blankets)?: Unable Difficulty moving from lying on back to sitting on the  side of the bed? : Unable Difficulty sitting down on and standing up from a chair with arms (e.g., wheelchair, bedside commode, etc,.)?: Unable Help needed moving to and from a bed to chair (including a wheelchair)?: A Lot Help needed walking in hospital room?: Total Help needed climbing 3-5 steps with a railing? : Total 6 Click Score: 7    End of Session Equipment Utilized During Treatment: Gait belt Activity Tolerance: Patient tolerated treatment well Patient left: with call bell/phone within reach;in chair;with chair alarm set Nurse Communication: Mobility status;Other (comment)(orthostatic BP) PT Visit Diagnosis: Unsteadiness on feet (R26.81);Hemiplegia and hemiparesis Hemiplegia - Right/Left: Right Hemiplegia - dominant/non-dominant: Non-dominant Hemiplegia - caused by: Nontraumatic intracerebral hemorrhage     Time: 0930-1006 PT Time Calculation (min) (ACUTE ONLY): 36 min  Charges:  $Therapeutic Activity: 23-37 mins                     Earney Navy, PTA Pager: [phone]     Darliss Cheney 02/02/2018, 1:46 PM

## 2018-02-02 NOTE — Progress Notes (Signed)
Pt blood pressure improved to 134/79 Hr 96 after 250 mls of saline bolus given and pt became responsive moving left side of the body again. Skin color ,respiratrion also improved as evidence by stable VS see flow sheet. Additional bolus of 250 for a total of 500 cc given and IV resumed back at 50 ml/hr   BP.  New orders given, hydralazine IV discontinued. No acute distress. RN will continue to. monitor pt's .BP and general  status closely.

## 2018-02-02 NOTE — Progress Notes (Signed)
Nutrition Follow-up  DOCUMENTATION CODES:   Not applicable  INTERVENTION:  Continue Jevity 1.2 at 47m/hr, Pro-stat 354mdaily  NUTRITION DIAGNOSIS:   Moderate Malnutrition related to chronic illness(known cancer hx with dysphagia ) as evidenced by moderate muscle depletion, moderate fat depletion. -ongoing  GOAL:   Patient will meet greater than or equal to 90% of their needs -met with TF  MONITOR:   Diet advancement, TF tolerance  ASSESSMENT:   Pt with PMH of HTN, HLD, DM, CKD, and hx of nasopharyngeal cancer s/p radiation but no recurrence for 3 years on a dysphagia diet PTA now admitted for L large ICH with 4 mm shift likely due to HTN.   Patient continues with tube feeds at goal rate.  Tolerating, no complaints. Discussed with RN.  [facility] was called last night for hypotension after hydralazine was given.  SLP is working with him, but he coughed on 50% of trials with thin liquids yesterday and no longer wanted to trial oral intake. They are recommending long term alternative means of nutrition at this time.  Medications reviewed and include:  Insulin, Liquid MVI, Miralax, Senokot-S NS at 5021mr  Labs reviewed:  CBGs 157, 170, 140 Na 153, BUN/Cr 52/1.98   Intake/Output Summary (Last 24 hours) at 02/02/2018 1317 Last data filed at 02/02/2018 1309 Gross per 24 hour  Intake 3306.5 ml  Output 2070 ml  Net 1236.5 ml  0.9L Fluid Positive   Diet Order:   Diet Order           Diet NPO time specified  Diet effective now          EDUCATION NEEDS:   No education needs have been identified at this time  Skin:  Skin Assessment: Reviewed RN Assessment  Last BM:  PTA  Height:   Ht Readings from Last 1 Encounters:  01/26/18 _0  (1.753 m)    Weight:   Wt Readings from Last 1 Encounters:  02/02/18 136 lb 11 oz (62 kg)    Ideal Body Weight:  72.7 kg  BMI:  Body mass index is 20.18 kg/m.  Estimated Nutritional Needs:   Kcal:  1800-2000  Protein:   90-100 grams  Fluid:  >1.8 L/day    WilSatira Anisard, MS, RD LDN Inpatient Clinical Dietitian Pager [phone]

## 2018-02-02 NOTE — PMR Pre-admission (Signed)
PMR Admission Coordinator Pre-Admission Assessment  Patient: Adam Yang is an 72 y.o., male MRN: [id number] DOB: 10-Oct-1945 Height: 5\' 9"  (175.3 cm) Weight: 62 kg (136 lb 11 oz)              Insurance Information HMO: X    PPO:      PCP:      IPA:      80/20:      OTHER:  PRIMARY: BCBS Medicare       Policy#: [id number]      Subscriber: Self CM Name: Pieter Partridge.     Phone#: [phone]     Fax#: [phone] Pre-Cert#:                          Received verbal authorization 02/01/18 from Pieter Partridge. with request to call after admission to receive authorization number and dates                        Employer: Retired  Benefits:  Phone #: [phone]     Name: Verified via phone call on 01/30/18 with Nel B.  Eff. Date: 07/12/17     Deduct: $0      Out of Pocket Max: [phone]      Life Max: N/A CIR: $310 a day, days 1-6; $0 a day, days 7+      SNF: $0 a day, days 1-20; $172 a day, days 21-60; $0 a day, days 61-100 Outpatient: PT, OT, SLP (authorization required)     Co-Pay: $40 per visit  Home Health: PT, OT, SLP (authorization required)      Co-Pay: $0 DME: 80%     Co-Pay: 20% Providers: In-network   SECONDARY: None        Emergency Contact Information Contact Information    Name Relation Home Work Mobile   Roes,Cathy Spouse [phone]  [phone]   [place], [place] Daughter   [phone]     Current Medical History  Patient Admitting Diagnosis: 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  Presented 01/26/2018 with right-sided weakness as well as facial droop and bradycardia.  Urine drug screen negative.  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  Vasogenic edema and mass-effect with a 4 mm midline shift and  effacement of the left ventricle.  CT angiogram of head and neck showed no acute or focal vascular lesion to explain the patient's hemorrhage.  No definite enhancement or mass lesion.  Echocardiogram with ejection fraction of 70% no wall motion abnormalities.  No cardiac source of emboli.  Patient did not receive TPA secondary to hemorrhage.  Follow-up neurology services latest repeat cranial CT scan 01/28/2018 showing no significant interval change in size of hemorrhage.  No hydrocephalus or ventricular trapping.  Noted on 02/01/2018 patient with episode of hypotension requiring bolus of IV fluids with subsequent improvement in blood pressure as well as mental status.  Patient currently NPO with a Cortrak tube for nutritional support.  Physical and occupational therapy evaluations completed with recommendations of physical medicine rehab consult.  Patient was admitted for a comprehensive rehab program 02/02/18.  NIH Total: 20    Past Medical History  Past Medical History:  Diagnosis Date  . Anxiety  Claustrophobia  . Arthritis   . Cancer (HCC)    nasopharynx  . Chronic cough    EXCESS MUCUS BUILDUP SINCE ORAL SURGERY FROM CANCER   . Chronic kidney disease    Renal Insuffiency , Creatinine has been in the 2- 3 range, 11/27/14 Creatine 1.4  . Diabetes mellitus without complication ([place])    Type II  . Gout   . History of radiation therapy 12/23/2014- 02/10/2015   Nasopharynx / Neck Adenopathy  . Hyperlipidemia   . Hypertension   . Inguinal hernia   . Right inguinal hernia 08/23/2017    Family History  family history includes Cancer in his brother, father, and mother; Heart block in his brother.  Prior Rehab/Hospitalizations:  Has the patient had major surgery during 100 days prior to admission? No, last surgery 08/23/17  Current Medications   Current Facility-Administered Medications:  .  0.9 %  sodium chloride infusion, , Intravenous, Continuous, Garvin Fila, MD, Last Rate: 50 mL/hr  at 02/02/18 0904 .  0.9 %  sodium chloride infusion, 500 mL, Intravenous, Continuous, Garvin Fila, MD, Stopped at 01/31/18 1728 .  amLODipine (NORVASC) tablet 10 mg, 10 mg, Oral, Daily, Garvin Fila, MD, 10 mg at 02/02/18 0901 .  artificial tears (LACRILUBE) ophthalmic ointment, , Both Eyes, Q3H PRN, Garvin Fila, MD .  chlorhexidine (PERIDEX) 0.12 % solution 15 mL, 15 mL, Mouth Rinse, BID, Garvin Fila, MD, 15 mL at 02/02/18 0901 .  labetalol (NORMODYNE,TRANDATE) injection 20 mg, 20 mg, Intravenous, Once **AND** clevidipine (CLEVIPREX) infusion 0.5 mg/mL, 0-21 mg/hr, Intravenous, Continuous, Garvin Fila, MD, Stopped at 01/29/18 1114 .  feeding supplement (JEVITY 1.2 CAL) liquid 1,000 mL, 1,000 mL, Per Tube, Continuous, Garvin Fila, MD, Last Rate: 60 mL/hr at 02/02/18 0600 .  feeding supplement (PRO-STAT SUGAR FREE 64) liquid 30 mL, 30 mL, Per Tube, Daily, Garvin Fila, MD, 30 mL at 02/02/18 0901 .  fenofibrate tablet 160 mg, 160 mg, Oral, Daily, Garvin Fila, MD, 160 mg at 02/02/18 0901 .  insulin aspart (novoLOG) injection 0-15 Units, 0-15 Units, Subcutaneous, Q4H, Greta Doom, MD, 2 Units at 02/02/18 0830 .  labetalol (NORMODYNE,TRANDATE) injection 10 mg, 10 mg, Intravenous, Q10 min PRN, Greta Doom, MD .  lisinopril (PRINIVIL,ZESTRIL) tablet 20 mg, 20 mg, Oral, Daily, Garvin Fila, MD, 20 mg at 02/01/18 2029 .  MEDLINE mouth rinse, 15 mL, Mouth Rinse, Q2H, Garvin Fila, MD, 15 mL at 02/02/18 0537 .  multivitamin liquid 15 mL, 15 mL, Per Tube, Daily, Garvin Fila, MD, 15 mL at 02/02/18 0901 .  nystatin (MYCOSTATIN) 100000 UNIT/ML suspension 500,000 Units, 5 mL, Mouth/Throat, QID, Garvin Fila, MD, 500,000 Units at 02/02/18 0901 .  polyethylene glycol (MIRALAX / GLYCOLAX) packet 17 g, 17 g, Oral, Daily, Garvin Fila, MD, 17 g at 02/02/18 0901 .  senna-docusate (Senokot-S) tablet 1 tablet, 1 tablet, Oral, BID, Garvin Fila, MD, 1  tablet at 02/02/18 0914 .  tiZANidine (ZANAFLEX) tablet 4 mg, 4 mg, Oral, QHS, Sethi, Pramod S, MD, 4 mg at 02/01/18 2240  Patients Current Diet:  Diet Order           Diet NPO time specified  Diet effective now          Precautions / Restrictions Precautions Precautions: Fall Restrictions Weight Bearing Restrictions: No   Has the patient had 2 or more falls or a fall with injury in the past year?No  Prior  Activity Level Community (5-7x/wk): Prior to admission patient was fully independent and enjoyed gardening, movies, computer, and was a Retail banker at [employer].  He is a retired Print production planner at [employer], retired Patent examiner, and is s/p nasopharynx cancer with radiation 3 years ago.  He recieved SLP treatment at outpatient and had progressed to eating Dys.1-Dys.2 textures at home.    Home Assistive Devices / Equipment Home Assistive Devices/Equipment: Dentures (specify type)(upper and lower) Home Equipment: Shower seat  Prior Device Use: Indicate devices/aids used by the patient prior to current illness, exacerbation or injury? None of the above  Prior Functional Level Prior Function Level of Independence: Independent Comments: retired Education officer, museum, enjoys gardening, enjoys computer work,   Self Care: Did the patient need help bathing, dressing, using the toilet or eating? Independent  Indoor Mobility: Did the patient need assistance with walking from room to room (with or without device)? Independent  Stairs: Did the patient need assistance with internal or external stairs (with or without device)? Independent  Functional Cognition: Did the patient need help planning regular tasks such as shopping or remembering to take medications? Independent  Current Functional Level Cognition  Overall Cognitive Status: Difficult to assess Difficult to assess due to: Impaired communication Orientation Level: Oriented to place, Oriented to person General Comments: More  consistently following verbal one step commands; min attempts at answering questions Attention: Sustained Sustained Attention: Appears intact    Extremity Assessment (includes Sensation/Coordination)  Upper Extremity Assessment: RUE deficits/detail RUE Deficits / Details: flaccid brunstrom I RUE Sensation: WNL  Lower Extremity Assessment: Defer to PT evaluation RLE Deficits / Details: Hip flexor, knee extensor, ankle dorsiflexion 2/5; tending to hyperextend R knee in standing; minimal to no RLE muscle activation with weight shift onto RLE    ADLs  Overall ADL's : Needs assistance/impaired Eating/Feeding: NPO Grooming: Wash/dry face, Moderate assistance, Sitting Grooming Details (indicate cue type and reason): used LUE Upper Body Bathing: Maximal assistance Lower Body Bathing: Total assistance Upper Body Dressing : Maximal assistance Lower Body Dressing: Total assistance, Bed level Lower Body Dressing Details (indicate cue type and reason): to don socks Toilet Transfer: Moderate assistance, +2 for physical assistance, +2 for safety/equipment Toilet Transfer Details (indicate cue type and reason): simulated through sit <>stand from bed and single leg raises in standing Functional mobility during ADLs: Maximal assistance, +2 for physical assistance, +2 for safety/equipment General ADL Comments: focus of session on sitting and standing balance and maintaining midline position; pt requiring up to maxA for static sitting balance, sitting EOB approx 7-8 min. Transferred to recliner via stand pivot with maxA+2. Additional practice on sit<>stand and standing balance using LUE support on regular chair and pt requiring maxA+2 to maintain static standing balance; utilized mirror for visual feedback during sitting and standing activity     Mobility  Overal bed mobility: Needs Assistance Bed Mobility: Supine to Sit Rolling: Max assist Sidelying to sit: Max assist, +2 for safety/equipment Supine to  sit: Max assist General bed mobility comments: pt able to bring L LE to EOB and assist to elevate trunk with L UE; use of rail to roll; assistance required to bring R LE and hips to EOB and to elevate trunk into sitting     Transfers  Overall transfer level: Needs assistance Equipment used: 2 person hand held assist(use of gait belt ) Transfers: Sit to/from Stand, Stand Pivot Transfers Sit to Stand: +2 physical assistance, Mod assist, Max assist Stand pivot transfers: Max assist, +2 physical assistance General transfer  comment: pt requires assistance to power up into standing and to maintain balance upon standing with R knee blocked and facilitation at pelvis and trunk for upright posture and weight shifting for pivot to recliner; completed additional sit<>stand from recliner using LUE support on chair placed in front of pt, completing with mod-maxA+2     Ambulation / Gait / Stairs / Wheelchair Mobility  Ambulation/Gait Ambulation/Gait assistance: Max assist, +2 physical assistance General Gait Details: Pre-gait activities in standing; worked on R single limb stance with R knee supported anteriorly to prevent buckle and supported knee posteriorly to prevent hyperextension in stance; facilitated weight shifts onto RLE with brief L foot pickups    Posture / Balance Dynamic Sitting Balance Sitting balance - Comments: pushes to R with stronger LUE Balance Overall balance assessment: Needs assistance Sitting balance-Leahy Scale: Poor Sitting balance - Comments: pushes to R with stronger LUE Standing balance-Leahy Scale: Zero    Special needs/care consideration BiPAP/CPAP: No CPM: No Continuous Drip IV: No Dialysis: No         Life Vest: No Oxygen: No Special Bed: No Trach Size: No Wound Vac (area): No       Skin: WDL per chart review                               Bowel mgmt: No documented BM since admission 01/26/18, nursing has administered laxatives and stool softeners with no result  yet. Per RN report 02/02/18 patient passing gas. Bladder mgmt: Incontinent with external foley in place  Diabetic mgmt: No, HgbA1c: 6.0     Previous Home Environment Living Arrangements: Spouse/significant other  Lives With: Spouse, Daughter Available Help at Discharge: Family, Available 24 hours/day Type of Home: House Home Layout: Multi-level, Able to live on main level with bedroom/bathroom Alternate Level Stairs-Number of Steps: flight Home Access: Ramped entrance Bathroom Shower/Tub: Chiropodist: [place]: No Additional Comments: Unable to glean information about PLOF or home setup from pt  Discharge Living Setting Plans for Discharge Living Setting: Patient's home, Lives with (comment)(Spouse, daughter, and son-in-law) Type of Home at Discharge: House Discharge Home Layout: Multi-level, Able to live on main level with bedroom/bathroom Discharge Home Access: Ramped entrance(with threshold from porch into kitchen ) Discharge Bathroom Shower/Tub: Tub/shower unit, Curtain Discharge Bathroom Toilet: Standard Discharge Bathroom Accessibility: Yes How Accessible: Accessible via walker(maybe wheelchair per spouse's report ) Does the patient have any problems obtaining your medications?: Yes (Describe)(Spouse expressed finincial concerns and requested to speak with acute CSW, notified Kathlee Nations CSW with request for her to follow up with patient's spouse)  Social/Family/Support Systems Patient Roles: Spouse, Parent, 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 Information: Spouse: Secondary school teacher  Anticipated Caregiver: Spouse: Tye [place], Daughter: Summer and son-in-law Anticipated Caregiver's Contact Information: Valeta Harms: [phone] Ability/Limitations of Caregiver: None Caregiver Availability: 24/7 Discharge Plan Discussed with Primary Caregiver: Yes Is Caregiver In Agreement with Plan?: Yes Does Caregiver/Family have Issues with  Lodging/Transportation while Pt is in Rehab?: No  Goals/Additional Needs Patient/Family Goal for Rehab: PT/OT/SLP: Min-Mod A Expected length of stay: 25-30 days  Cultural Considerations: Christian, prayer is important to them  Dietary Needs: NPO with Cortrak and history of dysphagia  Equipment Needs: TBD Pt/Family Agrees to Admission and willing to participate: Yes Program Orientation Provided & Reviewed with Pt/Caregiver Including Roles  & Responsibilities: Yes Additional Information Needs: Daughter and son-in-law recently moved back in with them from [place] daughter currently  not working  Information Needs to be Provided By: Team FYI  Barriers to Discharge: Medical stability, Home environment access/layout, Nutrition means(Spouse expressed concerns about bathroom set-up)  Decrease burden of Care through IP rehab admission: Recommended by Rehab MD for [facility] equipment needs, Diet advancement, Decrease number of caregivers, Bowel and bladder program and Patient/family education; however, given insurer is a managed medicare provider discussed with patient, spouse, and daughter that they needed to choose CIR versus SNF.  They choose CIR with plans to discharge home with home health being anticipated as post CIR follow-up needs.    Possible need for SNF placement upon discharge: Not anticipated   Patient Condition: This patient's medical and functional status has changed since the consult dated: 01/30/18 in which the Rehabilitation Physician determined and documented that the patient's condition is appropriate for intensive rehabilitative care in an inpatient rehabilitation facility. See "History of Present Illness" (above) for medical update. Functional changes are: Mod-Max A +2 transfers. Patient's medical and functional status update has been discussed with the Rehabilitation physician and patient remains appropriate for inpatient rehabilitation. Will admit to inpatient rehab today.  Preadmission  Screen Completed By:  Gunnar Fusi, 02/02/2018 12:04 PM ______________________________________________________________________   Discussed status with Dr. Letta Pate on 02/02/18 at 30 and received telephone approval for admission today.  Admission Coordinator:  Gunnar Fusi, time 1200/Date 02/02/18

## 2018-02-02 NOTE — Progress Notes (Signed)
Primary RN came into pt room and heard pt snoring heavily and saw that BP drop down to 68/49.,nurse led on pt but was not responding though pt has a pulse of 86 and Saturation was at 96% RA with some agonal breath.a brisk sternal rub given but pt still unresponsive and pale.,. quick neuro assessment done and noted  drift in all extremities with pupils very sluggish. Bp recycle and it was still low   NS increased to 100 cc and MD and rapid response nurse paged. As pt remained unresponsive with apneic breath. RN,  Agricultural consultant  RRT RNN came in. 1 liter 0.9 % NS started Per MD order.  Neuro on call also at bedside evaluating pt.

## 2018-02-02 NOTE — Care Management Note (Signed)
Case Management Note  Patient Details  Name: Adam Yang MRN: [id number] Date of Birth: 1946-04-04  Subjective/Objective:                    Action/Plan: Patient is discharging to CIR today. CM signing off.   Expected Discharge Date:                  Expected Discharge Plan:  [place]  In-House Referral:     Discharge planning Services  CM Consult  Post Acute Care Choice:    Choice offered to:     DME Arranged:    DME Agency:     HH Arranged:    [place] Agency:     Status of Service:  Completed, signed off  If discussed at [employer] of Stay Meetings, dates discussed:    Additional Comments:  Pollie Friar, RN 02/02/2018, 1:59 PM

## 2018-02-02 NOTE — Progress Notes (Signed)
Inpatient Rehabilitation  Note events from overnight, medication adjustments, and patient appears stable this monring.  I have acute medical clearance, insurance authorization, and a bed to offer today.  Plan to proceed with admission.  Discussed with team.  Call if questions.   Carmelia Roller., CCC/SLP Admission Coordinator  [place]  Cell [phone]

## 2018-02-02 NOTE — Discharge Summary (Addendum)
Stroke Discharge Summary  Patient ID: Adam Yang   MRN: [id number]      DOB: 1946-06-06  Date of Admission: 01/26/2018 Date of Discharge: 02/02/2018  Attending Physician:  Garvin Fila, MD, Stroke MD Consultant(s):   Delice Lesch, MD (Physical Medicine & Rehabtilitation)  Patient's PCP:  Lawerance Cruel, MD  Discharge Diagnoses:  Principal Problem:   Nontraumatic subcortical hemorrhage of left cerebral hemisphere [facility]) probably hypertensive Active Problems:   History of nasopharyngeal cancer   Chronic anxiety   Essential hypertension   Chronic kidney disease, stage III (moderate) (HCC)   IVH (intraventricular hemorrhage) (HCC)   Cytotoxic brain edema (HCC)   Brain herniation (HCC)   Diabetes mellitus type 2 in nonobese ([place])   AKI (acute kidney injury) ([place])   Hyponatremia   Hypertensive emergency  Past Medical History:  Diagnosis Date  . Anxiety    Claustrophobia  . Arthritis   . Cancer (HCC)    nasopharynx  . Chronic cough    EXCESS MUCUS BUILDUP SINCE ORAL SURGERY FROM CANCER   . Chronic kidney disease    Renal Insuffiency , Creatinine has been in the 2- 3 range, 11/27/14 Creatine 1.4  . Diabetes mellitus without complication ([place])    Type II  . Gout   . History of radiation therapy 12/23/2014- 02/10/2015   Nasopharynx / Neck Adenopathy  . Hyperlipidemia   . Hypertension   . Inguinal hernia   . Right inguinal hernia 08/23/2017   Past Surgical History:  Procedure Laterality Date  . colonoscopy    . COLONOSCOPY W/ POLYPECTOMY    . INGUINAL HERNIA REPAIR Right 08/23/2017   Procedure: OPEN REPAIR RIGHT INGUINAL HERNIA WITH MESH;  Surgeon: Fanny Skates, MD;  Location: [facility];  Service: General;  Laterality: Right;  GENERAL AND TAP BLOCK  . INSERTION OF MESH Right 08/23/2017   Procedure: INSERTION OF MESH;  Surgeon: Fanny Skates, MD;  Location: [facility];  Service: General;  Laterality: Right;  GENERAL AND TAP BLOCK  . KNEE SURGERY Left 1990's   tibia  crushed, pinned and bone grafted  . LYMPH NODE BIOPSY    . MULTIPLE EXTRACTIONS WITH ALVEOLOPLASTY N/A 12/06/2014   Procedure: Extraction of tooth #'s 2,4,5,6,7,8,9,10,11,12,13,14,15, 20,22,26,27,28,29 with alveoloplasty.;  Surgeon: Lenn Cal, DDS;  Location: [place];  Service: Oral Surgery;  Laterality: N/A;    Medications to be continued on Rehab Allergies as of 02/02/2018   No Known Allergies     Medication List    STOP taking these medications   CENTRUM SILVER PO   FISH OIL PO   guaiFENesin 600 MG 12 hr tablet Commonly known as:  MUCINEX     TAKE these medications   amLODipine 10 MG tablet Commonly known as:  NORVASC Take 1 tablet (10 mg total) by mouth daily. Start taking on:  02/03/2018 What changed:    medication strength  how much to take   artificial tears Oint ophthalmic ointment Commonly known as:  LACRILUBE Place into both eyes every 3 (three) hours as needed for dry eyes.   atorvastatin 20 MG tablet Commonly known as:  LIPITOR Take 1 tablet (20 mg total) by mouth daily at 6 PM.   chlorhexidine 0.12 % solution Commonly known as:  PERIDEX 15 mLs by Mouth Rinse route 2 (two) times daily.   feeding supplement (JEVITY 1.2 CAL) Liqd Place 1,000 mLs into feeding tube continuous.   feeding supplement (PRO-STAT SUGAR FREE 64) Liqd Place 30 mLs into  feeding tube daily. Start taking on:  02/03/2018   fenofibrate 160 MG tablet Take 160 mg by mouth daily.   insulin aspart 100 UNIT/ML injection Commonly known as:  novoLOG Inject 0-15 Units into the skin every 4 (four) hours.   lisinopril 20 MG tablet Commonly known as:  PRINIVIL,ZESTRIL Take 1 tablet (20 mg total) by mouth daily.   mouth rinse Liqd solution 15 mLs by Mouth Rinse route every 2 (two) hours.   multivitamin Liqd Place 15 mLs into feeding tube daily. Start taking on:  02/03/2018   nystatin 100000 UNIT/ML suspension Commonly known as:  MYCOSTATIN Use as directed 5 mLs (500,000 Units  total) in the mouth or throat 4 (four) times daily.   polyethylene glycol packet Commonly known as:  MIRALAX / GLYCOLAX Take 17 g by mouth daily. Start taking on:  02/03/2018   senna-docusate 8.6-50 MG tablet Commonly known as:  Senokot-S Take 1 tablet by mouth 2 (two) times daily.   sodium chloride 0.9 % infusion Inject 500 mLs into the vein continuous. IV at 76mL/hr   tiZANidine 4 MG tablet Commonly known as:  ZANAFLEX Take 4 mg by mouth at bedtime.       LABORATORY STUDIES CBC    Component Value Date/Time   WBC 8.2 01/31/2018 0401   RBC 4.84 01/31/2018 0401   HGB 12.6 (L) 01/31/2018 0401   HGB 12.2 (L) 02/18/2017 1503   HCT 40.7 01/31/2018 0401   HCT 35.9 (L) 02/18/2017 1503   PLT 301 01/31/2018 0401   PLT 254 02/18/2017 1503   MCV 84.1 01/31/2018 0401   MCV 84.1 02/18/2017 1503   MCH 26.0 01/31/2018 0401   MCHC 31.0 01/31/2018 0401   RDW 15.3 01/31/2018 0401   RDW 14.8 (H) 02/18/2017 1503   LYMPHSABS 2.1 01/26/2018 0913   LYMPHSABS 1.2 02/18/2017 1503   MONOABS 0.6 01/26/2018 0913   MONOABS 0.7 02/18/2017 1503   EOSABS 0.3 01/26/2018 0913   EOSABS 0.3 02/18/2017 1503   BASOSABS 0.0 01/26/2018 0913   BASOSABS 0.0 02/18/2017 1503   CMP    Component Value Date/Time   NA 153 (H) 02/02/2018 0436   NA 139 02/18/2017 1502   K 4.3 02/02/2018 0436   K 4.3 02/18/2017 1502   CL 119 (H) 02/02/2018 0436   CO2 25 02/02/2018 0436   CO2 27 02/18/2017 1502   GLUCOSE 190 (H) 02/02/2018 0436   GLUCOSE 108 02/18/2017 1502   BUN 52 (H) 02/02/2018 0436   BUN 30.9 (H) 02/18/2017 1502   CREATININE 1.98 (H) 02/02/2018 0436   CREATININE 1.6 (H) 02/18/2017 1502   CALCIUM 9.1 02/02/2018 0436   CALCIUM 9.6 02/18/2017 1502   PROT 7.9 01/26/2018 0913   PROT 7.0 02/18/2017 1502   ALBUMIN 3.7 01/26/2018 0913   ALBUMIN 3.0 (L) 02/18/2017 1502   AST 21 01/26/2018 0913   AST 16 02/18/2017 1502   ALT 15 01/26/2018 0913   ALT 12 02/18/2017 1502   ALKPHOS 40 01/26/2018 0913    ALKPHOS 58 02/18/2017 1502   BILITOT 0.4 01/26/2018 0913   BILITOT 0.25 02/18/2017 1502   GFRNONAA 32 (L) 02/02/2018 0436   GFRAA 38 (L) 02/02/2018 0436   COAGS Lab Results  Component Value Date   INR 0.95 01/26/2018   INR 0.98 06/24/2015   INR 1.00 12/17/2014   Lipid Panel    Component Value Date/Time   CHOL 231 (H) 01/27/2018 0459   TRIG 133 02/02/2018 0436   HDL 49 01/27/2018 0459  CHOLHDL 4.7 01/27/2018 0459   VLDL 71 (H) 01/27/2018 0459   LDLCALC 111 (H) 01/27/2018 0459   HgbA1C  Lab Results  Component Value Date   HGBA1C 6.0 (H) 01/27/2018   Urinalysis    Component Value Date/Time   COLORURINE STRAW (A) 01/26/2018 1227   APPEARANCEUR CLEAR 01/26/2018 1227   LABSPEC 1.018 01/26/2018 1227   PHURINE 7.0 01/26/2018 1227   GLUCOSEU 150 (A) 01/26/2018 1227   HGBUR SMALL (A) 01/26/2018 1227   BILIRUBINUR NEGATIVE 01/26/2018 1227   KETONESUR NEGATIVE 01/26/2018 1227   PROTEINUR 100 (A) 01/26/2018 1227   NITRITE NEGATIVE 01/26/2018 1227   LEUKOCYTESUR NEGATIVE 01/26/2018 1227   Urine Drug Screen     Component Value Date/Time   LABOPIA NONE DETECTED 01/26/2018 1227   COCAINSCRNUR NONE DETECTED 01/26/2018 1227   LABBENZ NONE DETECTED 01/26/2018 1227   AMPHETMU NONE DETECTED 01/26/2018 1227   THCU NONE DETECTED 01/26/2018 1227   LABBARB (A) 01/26/2018 1227    Result not available. Reagent lot number recalled by manufacturer.    Alcohol Level    Component Value Date/Time   ETH <10 01/26/2018 0913    SIGNIFICANT DIAGNOSTIC STUDIES Ct Head Code Stroke Wo Contrast IMPRESSION:  1. 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  2. Screening vasogenic edema and mass effect with 4 mm of midline shift and effacement of the left lateral ventricle.  3. Mild generalized white matter disease likely reflects the sequela of chronic microvascular ischemia   Ct Angio Head and neck W Or Wo Contrast 01/26/2018 1. No acute or focal vascular  lesion to explain the patient's hemorrhage.  2. No definite enhancement or mass lesion.  3. Atherosclerotic changes involving the aortic arch and bilateral carotid bifurcations without a significant stenosis relative to the more distal vessels.  4. Cavernous atherosclerotic changes are much more mild, also without a significant stenosis.  5. Left MCA branch vessels are displaced by the hemorrhagic mass.   Ct Head Wo Contrast 01/26/2018 Unchanged size of large intraparenchymal hemorrhage centered in the left frontal white matter.  Unchanged 4 mm rightward midline shift.   CT Head Wo Contrast 01/28/2018 1. No significant interval change in size and distribution of large intraparenchymal hemorrhage centered at the left frontal white matter. Similar localized regional mass effect with persistent 4 mm left-to-right shift. 2. Interval development of small volume intraventricular hemorrhage. No hydrocephalus or ventricular trapping. 3. No other new acute intracranial abnormality.  Transthoracic Echocardiogram  01/27/18 - Left ventricle: The cavity size was normal. Systolic function wasvigorous. The estimated ejection fraction was in the range of 65%to 70%. Wall motion was normal; there were no regional wallmotion abnormalities. Left ventricular diastolic functionparameters were normal. - Aortic valve: Trileaflet; moderately thickened, moderatelycalcified leaflets. Transvalvular velocity was within the normalrange. There was no stenosis. There was no regurgitation. - Mitral valve: There was no regurgitation. - Right ventricle: The cavity size was normal. Wall thickness wasnormal. Systolic function was normal. - Tricuspid valve: There was no regurgitation. - Pulmonary arteries: Systolic pressure could not be accuratelyestimated. Impressions:   Limited study quality, hyperdynamic LVEF, aortic valve leafletsare calcified, no stenosis, no source of embolism was seen.     HISTORY OF PRESENT  ILLNESS Adam Yang is an 72 y.o. male with past medical history of hypertension, diabetes, hyperlipidemia, past radiation for nasopharynx cancer.  Patient was well this morning 01/26/2018 until approximately 8:15 (LKW) in the morning when he had told his wife something was  wrong.  She noted that he was flaccid on the right side, placed in the bed and called EMS.  EMS quickly came and brought him to [facility] as a code stroke.  Upon entering the bridge it was noted that patient could not cross midline to the right and was flaccid on the right.  Patient quickly was brought to CT scan which showed a left frontoparietal bleed.  Patient's blood pressure was quickly addressed with Cleviprex and labetalol. We attempted to obtain an MRI of the brain but the patient's heart rate kept dropping to the 30s.  MRI was aborted. Modified Rankin: Rankin Score=0. Intracerebral Hemorrhage (ICH) Score Total: 3. He was admitted to the neuro ICU for further evaluation and treatment.    HOSPITAL COURSE Adam Yang is a 72 y.o. male with history of hypertension, hyperlipidemia, nasopharyngeal cancer status post radiation, diabetes mellitus, chronic kidney disease, anxiety and claustrophobia presenting right-sided flaccidity visual deficits, and bradycardia. found to have a large hypertensive L intraparenchymal hemorrhage.  Stroke:  left hemisphere large ICH with IVH and cytotoxic cerebral edema w/ subfalcine herniation, hemorrhage due to hypertension.   Resultant  Global aphasia, right hemiplegia, right neglect  CT head - Large hemorrhagic infarct measuring 7.8 x 4.4 x 5.2 cm. 4 mm rightward midline shift.   MRI head - not performed secondary to bradycardia.  CTA H&N - no source of bleeding identified. No enhancement  CT repeat showed stable hematoma  Repeat CT - no significant change other than interval development of small volume intraventricular hemorrhage.  2D Echo - EF 65 to 70%.  No cardiac  source of emboli identified.  LDL - 111  HgbA1c - 6.2  On no antithrombotic prior to admission  Therapy recommendations:  CIR  Disposition:  CIR  Hypertensive Emergency  BP on arrival as high as 194/95  Treated with Cleviprex in ED/ICU  SBP goal now < 180 mmHG  home meds: Norvasc 5  Now on: Norvasc 10, lisinopril 20  Long-term BP goal normotensive  Hypotension, resolved  SBP lowered to ~ 60 following hydralazine dose for BP > 180 during the night. Found to be less responsive with apnea. Treated with fluid bolus. Neuro symptoms improved  Diabetes, type II, controlled, HgbA1c 6.2, at goal < 7.0, on SSI  Hyperlipidemia  Lipid lowering medication PTA: Fenofibrate, fish oil  LDL 111, goal < 70  Current lipid lowering medication: fenofibrate  Added lipitor 20 at discharge  Dysphagia   Baseline dysphagia due to radiation in the area  NPO  Coretrak w/ tube feedings  SLP following  Other Stroke Risk Factors  Advanced age  Former cigarette smoker - quit  Other Active Problems  CKD stage III - nephrology follows as outpt Cr 2.80->1.98   Nasopharyngeal cancer s/p radiation - following with ENT - last seen 08/2017 - no recurrence for 3 years.  Leukocytosis, resolved -  Afebrile - WBCs 12.2 -> 8.2   DISCHARGE EXAM per Dr. Leonie Man Blood pressure (!) 170/86, pulse 97, temperature 98.8 F (37.1 C), temperature source Axillary, resp. rate 19, height 5\' 9"  (1.753 m), weight 62 kg (136 lb 11 oz), SpO2 97 %. General - frail elderly Caucasian male in no apparent distress. Ophthalmologic - fundi not visualized due to noncooperation. Cardiovascular - Regular rate and rhythm. Neuro -alert awake   following simple midline and left bodycommands, minimal speech output.and speaks only a few words. Marked expressive greater than receptive Global aphasia. Left gaze preference but able to cross midline  to right intermittently. Blinking to visual threat on the left, but  inconsistent to the right. PERRL. Right nasolabial fold flattening, tongue midline in mouth. LUE and LLE 4/5, spontaneous movement, but RUE and RLE flaccid even with pain stimulation. DTR 1+ and right babinski positive. Sensation, coordination and gait not tested.  Discharge Diet  NPO, Coretrak w/ TF  DISCHARGE PLAN  Disposition:  Transfer to [place] for ongoing PT, OT and ST  Due to hemorrhage and risk of bleeding, do not take aspirin, aspirin-containing medications, or ibuprofen products   Recommend ongoing risk factor control by Primary Care Physician at time of discharge from inpatient rehabilitation.  Follow-up Lawerance Cruel, MD in 2 weeks following discharge from rehab.  Follow-up in [facility] in 4 weeks following discharge from rehab, office to schedule an appointment.   45 minutes were spent preparing discharge.  Burnetta Sabin, MSN, APRN, ANVP-BC, AGPCNP-BC Advanced Practice Stroke Nurse [place] for Schedule & Pager information 02/02/2018 2:54 PM  I have personally examined this patient, reviewed notes, independently viewed imaging studies, participated in medical decision making and plan of care.ROS completed by me personally and pertinent positives fully documented  I have made any additions or clarifications directly to the above note. Agree with note above.   Antony Contras, MD Medical Director [facility] Pager: [phone] 02/02/2018 3:06 PM

## 2018-02-02 NOTE — H&P (Signed)
Physical Medicine and Rehabilitation Admission H&P     Chief Complaint  Patient presents with  . Code Stroke  : HPI: Adam Yang is a 72 year old right-handed male with history of hypertension, type 2 diabetes mellitus, CKD, nasopharynx cancer with radiation therapy and remote tobacco abuse.  Presented 01/26/2018 with right-sided weakness as well as facial droop and bradycardia.  Urine drug screen negative.  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  Vasogenic edema and mass-effect with a 4 mm midline shift and effacement of the left ventricle.  CT angiogram of head and neck showed no acute or focal vascular lesion to explain the patient's hemorrhage.  No definite enhancement or mass lesion.  Echocardiogram with ejection fraction of 70% no wall motion abnormalities.  No cardiac source of emboli.  Patient did not receive TPA secondary to hemorrhage.  Follow-up neurology services latest repeat cranial CT scan 01/28/2018 showing no significant interval change in size of hemorrhage.  No hydrocephalus or ventricular trapping.  Noted on 02/01/2018 patient with episode of hypotension requiring bolus of IV fluids with subsequent improvement in blood pressure as well as mental status.  Patient currently NPO with a Cortrak tube for nutritional support.  Physical and occupational therapy evaluations completed with recommendations of physical medicine rehab consult.  Patient was admitted for a comprehensive rehab program.   Review of Systems  Unable to perform ROS: Language        Past Medical History:  Diagnosis Date  . Anxiety      Claustrophobia  . Arthritis    . Cancer (HCC)      nasopharynx  . Chronic cough      EXCESS MUCUS BUILDUP SINCE ORAL SURGERY FROM CANCER   . Chronic kidney disease      Renal Insuffiency , Creatinine has been in the 2- 3 range, 11/27/14 Creatine 1.4  . Diabetes mellitus without complication ([place])      Type II  . Gout    . History of radiation  therapy 12/23/2014- 02/10/2015    Nasopharynx / Neck Adenopathy  . Hyperlipidemia    . Hypertension    . Inguinal hernia    . Right inguinal hernia 08/23/2017         Past Surgical History:  Procedure Laterality Date  . colonoscopy      . COLONOSCOPY W/ POLYPECTOMY      . INGUINAL HERNIA REPAIR Right 08/23/2017    Procedure: OPEN REPAIR RIGHT INGUINAL HERNIA WITH MESH;  Surgeon: Fanny Skates, MD;  Location: [facility];  Service: General;  Laterality: Right;  GENERAL AND TAP BLOCK  . INSERTION OF MESH Right 08/23/2017    Procedure: INSERTION OF MESH;  Surgeon: Fanny Skates, MD;  Location: [facility];  Service: General;  Laterality: Right;  GENERAL AND TAP BLOCK  . KNEE SURGERY Left 1990's    tibia crushed, pinned and bone grafted  . LYMPH NODE BIOPSY      . MULTIPLE EXTRACTIONS WITH ALVEOLOPLASTY N/A 12/06/2014    Procedure: Extraction of tooth #'s 2,4,5,6,7,8,9,10,11,12,13,14,15, 20,22,26,27,28,29 with alveoloplasty.;  Surgeon: Lenn Cal, DDS;  Location: [place];  Service: Oral Surgery;  Laterality: N/A;         Family History  Problem Relation Age of Onset  . Cancer Mother          throat ca  . Cancer Father          lung ca  . Cancer Brother  throat ca  . Heart block Brother      Social History:  reports that he quit smoking about 19 years ago. He has a 25.00 pack-year smoking history. He has never used smokeless tobacco. He reports that he does not drink alcohol or use drugs. Allergies: No Known Allergies       Medications Prior to Admission  Medication Sig Dispense Refill  . amLODipine (NORVASC) 5 MG tablet Take 5 mg by mouth daily.       Marland Kitchen guaiFENesin (MUCINEX) 600 MG 12 hr tablet Take by mouth 2 (two) times daily as needed.      . Multiple Vitamins-Minerals (CENTRUM SILVER PO) Take 1 tablet by mouth daily.       . Omega-3 Fatty Acids (FISH OIL PO) Take 1 capsule by mouth daily.       Marland Kitchen tiZANidine (ZANAFLEX) 4 MG tablet Take 4 mg by mouth at bedtime.   4  .  fenofibrate 160 MG tablet Take 160 mg by mouth daily.          Drug Regimen Review Drug regimen was reviewed and remains appropriate with no significant issues identified   Home: Home Living Family/patient expects to be discharged to:: Private residence Living Arrangements: Spouse/significant other Available Help at Discharge: Family, Available 24 hours/day Type of Home: House Home Access: Ramped entrance Home Layout: Multi-level, Able to live on main level with bedroom/bathroom Alternate Level Stairs-Number of Steps: flight Bathroom Shower/Tub: Chiropodist: Standard Home Equipment: Electronics engineer Comments: Unable to glean information about PLOF or home setup from pt  Lives With: Spouse, Daughter   Functional History: Prior Function Level of Independence: Independent Comments: retired Education officer, museum, enjoys gardening, enjoys computer work,    Functional Status:  Mobility: Bed Mobility Overal bed mobility: Needs Assistance Bed Mobility: Supine to [employer]: Max assist Sidelying to sit: Max assist, +2 for safety/equipment Supine to sit: Max assist General bed mobility comments: pt able to bring L LE to EOB and assist to elevate trunk with L UE; use of rail to roll; assistance required to bring R LE and hips to EOB and to elevate trunk into sitting  Transfers Overall transfer level: Needs assistance Equipment used: 2 person hand held assist(use of gait belt ) Transfers: Sit to/from Stand, Stand Pivot Transfers Sit to Stand: +2 physical assistance, Mod assist, Max assist Stand pivot transfers: Max assist, +2 physical assistance General transfer comment: pt requires assistance to power up into standing and to maintain balance upon standing with R knee blocked and facilitation at pelvis and trunk for upright posture and weight shifting for pivot to recliner; completed additional sit<>stand from recliner using LUE support on chair placed in front of pt,  completing with mod-maxA+2  Ambulation/Gait Ambulation/Gait assistance: Max assist, +2 physical assistance General Gait Details: Pre-gait activities in standing; worked on R single limb stance with R knee supported anteriorly to prevent buckle and supported knee posteriorly to prevent hyperextension in stance; facilitated weight shifts onto RLE with brief L foot pickups   ADL: ADL Overall ADL's : Needs assistance/impaired Eating/Feeding: NPO Grooming: Wash/dry face, Moderate assistance, Sitting Grooming Details (indicate cue type and reason): used LUE Upper Body Bathing: Maximal assistance Lower Body Bathing: Total assistance Upper Body Dressing : Maximal assistance Lower Body Dressing: Total assistance, Bed level Lower Body Dressing Details (indicate cue type and reason): to don socks Toilet Transfer: Moderate assistance, +2 for physical assistance, +2 for safety/equipment Toilet Transfer Details (indicate cue type and reason):  simulated through sit <>stand from bed and single leg raises in standing Functional mobility during ADLs: Maximal assistance, +2 for physical assistance, +2 for safety/equipment General ADL Comments: focus of session on sitting and standing balance and maintaining midline position; pt requiring up to maxA for static sitting balance, sitting EOB approx 7-8 min. Transferred to recliner via stand pivot with maxA+2. Additional practice on sit<>stand and standing balance using LUE support on regular chair and pt requiring maxA+2 to maintain static standing balance; utilized mirror for visual feedback during sitting and standing activity    Cognition: Cognition Overall Cognitive Status: Difficult to assess Orientation Level: Oriented to place, Oriented to person Attention: Sustained Sustained Attention: Appears intact Cognition Arousal/Alertness: Awake/alert Behavior During Therapy: WFL for tasks assessed/performed Overall Cognitive Status: Difficult to  assess General Comments: More consistently following verbal one step commands; min attempts at answering questions Difficult to assess due to: Impaired communication   Physical Exam: Blood pressure (!) 170/86, pulse 97, temperature 98.8 F (37.1 C), temperature source Axillary, resp. rate 19, height '5\' 9"'$  (1.753 m), weight 62 kg (136 lb 11 oz), SpO2 97 %. Physical Exam  Vitals reviewed. HENT:  Mild right facial weakness.  Nasogastric tube in place  Eyes:  Pupils reactive to light  Neck: Normal range of motion. Neck supple. No thyromegaly present.  Cardiovascular: Normal rate, regular rhythm and normal heart sounds.  Respiratory: Effort normal and breath sounds normal. No respiratory distress.  GI: Soft. Bowel sounds are normal. He exhibits no distension.  Neurological:  Patient is alert sitting up in chair.  Follows some simple commands.  Patient is globally aphasic which did limit overall exam  Skin: Skin is warm and dry.   0/5 Right delt, Bi, Tri, Grip, HF, KE, ADF Cannot assess sensation well due to aphasia, withdraws to pinch RLE but not LLE No vocalization, difficulty following commands, able to follow MMT on left side with gestural cues    Lab Results Last 48 Hours        Results for orders placed or performed during the hospital encounter of 01/26/18 (from the past 48 hour(s))  Glucose, capillary     Status: Abnormal    Collection Time: 01/31/18 11:43 AM  Result Value Ref Range    Glucose-Capillary 186 (H) 70 - 99 mg/dL    Comment 1 Notify RN      Comment 2 Document in Chart    Glucose, capillary     Status: Abnormal    Collection Time: 01/31/18  5:05 PM  Result Value Ref Range    Glucose-Capillary 155 (H) 70 - 99 mg/dL    Comment 1 Notify RN      Comment 2 Document in Chart    Glucose, capillary     Status: Abnormal    Collection Time: 01/31/18  8:00 PM  Result Value Ref Range    Glucose-Capillary 156 (H) 70 - 99 mg/dL  Glucose, capillary     Status: Abnormal     Collection Time: 02/01/18 12:15 AM  Result Value Ref Range    Glucose-Capillary 153 (H) 70 - 99 mg/dL  Glucose, capillary     Status: Abnormal    Collection Time: 02/01/18  3:33 AM  Result Value Ref Range    Glucose-Capillary 146 (H) 70 - 99 mg/dL    Comment 1 Notify RN      Comment 2 Document in Chart    Glucose, capillary     Status: Abnormal    Collection Time: 02/01/18  8:43 AM  Result Value Ref Range    Glucose-Capillary 164 (H) 70 - 99 mg/dL  Glucose, capillary     Status: Abnormal    Collection Time: 02/01/18 11:52 AM  Result Value Ref Range    Glucose-Capillary 163 (H) 70 - 99 mg/dL  Glucose, capillary     Status: Abnormal    Collection Time: 02/01/18  4:11 PM  Result Value Ref Range    Glucose-Capillary 189 (H) 70 - 99 mg/dL  Glucose, capillary     Status: Abnormal    Collection Time: 02/01/18  7:50 PM  Result Value Ref Range    Glucose-Capillary 166 (H) 70 - 99 mg/dL  Glucose, capillary     Status: Abnormal    Collection Time: 02/01/18 11:54 PM  Result Value Ref Range    Glucose-Capillary 157 (H) 70 - 99 mg/dL  Glucose, capillary     Status: Abnormal    Collection Time: 02/02/18  4:15 AM  Result Value Ref Range    Glucose-Capillary 170 (H) 70 - 99 mg/dL  Triglycerides     Status: None    Collection Time: 02/02/18  4:36 AM  Result Value Ref Range    Triglycerides 133 <150 mg/dL      Comment: Performed at [facility], [place] [address], [place], [place] [address]  Basic metabolic panel     Status: Abnormal    Collection Time: 02/02/18  4:36 AM  Result Value Ref Range    Sodium 153 (H) 135 - 145 mmol/L    Potassium 4.3 3.5 - 5.1 mmol/L    Chloride 119 (H) 98 - 111 mmol/L    CO2 25 22 - 32 mmol/L    Glucose, Bld 190 (H) 70 - 99 mg/dL    BUN 52 (H) 8 - 23 mg/dL    Creatinine, Ser 1.98 (H) 0.61 - 1.24 mg/dL    Calcium 9.1 8.9 - 10.3 mg/dL    GFR calc non Af Amer 32 (L) >60 mL/min    GFR calc Af Amer 38 (L) >60 mL/min      Comment: (NOTE) The eGFR has been  calculated using the CKD EPI equation. This calculation has not been validated in all clinical situations. eGFR's persistently <60 mL/min signify possible Chronic Kidney Disease.      Anion gap 9 5 - 15      Comment: Performed at [facility] [address], [place], [place] [address]  Glucose, capillary     Status: Abnormal    Collection Time: 02/02/18  8:08 AM  Result Value Ref Range    Glucose-Capillary 140 (H) 70 - 99 mg/dL      Imaging Results (Last 48 hours)  No results found.           Medical Problem List and Plan: 1.  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  DVT Prophylaxis/Anticoagulation: SCDs.  Monitor for any signs of DVT 3. Pain Management: Zanaflex 4 mg nightly 4. Mood: Provide emotional support 5. Neuropsych: This patient is not capable of making decisions on his own behalf. 6. Skin/Wound Care: Routine skin checks 7. Fluids/Electrolytes/Nutrition: Routine in and outs with follow-up chemistries 8.  Dysphasia.NPO/Cortrak in place.  Follow-up speech therapy 9.  Hypertension.  Patient with episode orthostasis 02/01/2018 and continue to monitor.  Presently on  lisinopril 20 mg daily, Norvasc 10 mg daily.  Monitor with increased mobility 10.  Diabetes  mellitus.  Hemoglobin A1c 6.0.  SSI.  Monitor closely while on tube feeds 11.  History of nasopharynx cancer.  Patient did receive radiation in the past. 12.  Hyperlipidemia.  Fenofibrate.   Post Admission Physician Evaluation: 1. Functional deficits secondary  to Left MCA hemorrhagic infarct. 2. Patient admitted to receive collaborative, interdisciplinary care between the physiatrist, rehab nursing staff, and therapy team. 3. Patient's level of medical complexity and substantial therapy needs in context of that medical necessity cannot be provided at a lesser intensity of care. 4. Patient has  experienced substantial functional loss from his/her baseline. Judging by the patient's diagnosis, physical exam, and functional history, the patient has potential for functional progress which will result in measurable gains while on inpatient rehab.  These gains will be of substantial and practical use upon discharge in facilitating mobility and self-care at the household level. 5. Physiatrist will provide 24 hour management of medical needs as well as oversight of the therapy plan/treatment and provide guidance as appropriate regarding the interaction of the two. 6. 24 hour rehab nursing will assist in the management of  bladder management, bowel management, safety, skin/wound care, disease management, medication administration, pain management and patient education  and help integrate therapy concepts, techniques,education, etc. 7. PT will assess and treat for pre gait, gait training, endurance , safety, equipment, neuromuscular re education:  .  Goals are: minimal assist. 8. OT will assess and treat for ADLs, Cognitive perceptual skills, Neuromuscular re education, safety, endurance, equipment  .  Goals are: minimal assist.  9. SLP will assess and treat for swallow , communication  .  Goals are: minimal assist with expressing Y/N, safe po intake. 10. Case Management and Social Worker will assess and treat for psychological issues and discharge planning. 11. Team conference will be held weekly to assess progress toward goals and to determine barriers to discharge. 12.  Patient will receive at least 3 hours of therapy per day at least 5 days per week. 13. ELOS and Prognosis: 22-25d fair  "I have personally performed a face to face diagnostic evaluation of this patient.  Additionally, I have reviewed and concur with the physician assistant's documentation above."   Charlett Blake M.D. [facility] FAAPM&R (Sports Med, Neuromuscular Med) Diplomate Am Board of Electrodiagnostic  Med  Elizabeth Sauer 02/02/2018

## 2018-02-03 ENCOUNTER — Inpatient Hospital Stay (HOSPITAL_COMMUNITY): Payer: Medicare Other

## 2018-02-03 ENCOUNTER — Inpatient Hospital Stay (HOSPITAL_COMMUNITY): Payer: Medicare Other | Admitting: Speech Pathology

## 2018-02-03 ENCOUNTER — Inpatient Hospital Stay (HOSPITAL_COMMUNITY): Payer: Medicare Other | Admitting: Physical Therapy

## 2018-02-03 ENCOUNTER — Inpatient Hospital Stay (HOSPITAL_COMMUNITY): Payer: Medicare Other | Admitting: Occupational Therapy

## 2018-02-03 LAB — COMPREHENSIVE METABOLIC PANEL
ALK PHOS: 52 U/L (ref 38–126)
ALT: 139 U/L — AB (ref 0–44)
AST: 40 U/L (ref 15–41)
Albumin: 3.2 g/dL — ABNORMAL LOW (ref 3.5–5.0)
Anion gap: 11 (ref 5–15)
BUN: 58 mg/dL — ABNORMAL HIGH (ref 8–23)
CALCIUM: 9.3 mg/dL (ref 8.9–10.3)
CHLORIDE: 119 mmol/L — AB (ref 98–111)
CO2: 27 mmol/L (ref 22–32)
CREATININE: 2.21 mg/dL — AB (ref 0.61–1.24)
GFR calc non Af Amer: 28 mL/min — ABNORMAL LOW (ref >60)
GFR, EST AFRICAN AMERICAN: 33 mL/min — AB (ref >60)
GLUCOSE: 141 mg/dL — AB (ref 70–99)
Potassium: 3.9 mmol/L (ref 3.5–5.1)
SODIUM: 157 mmol/L — AB (ref 135–145)
Total Bilirubin: 0.5 mg/dL (ref 0.3–1.2)
Total Protein: 6.9 g/dL (ref 6.5–8.1)

## 2018-02-03 LAB — GLUCOSE, CAPILLARY
GLUCOSE-CAPILLARY: 153 mg/dL — AB (ref 70–99)
GLUCOSE-CAPILLARY: 95 mg/dL (ref 70–99)
Glucose-Capillary: 164 mg/dL — ABNORMAL HIGH (ref 70–99)
Glucose-Capillary: 172 mg/dL — ABNORMAL HIGH (ref 70–99)
Glucose-Capillary: 98 mg/dL (ref 70–99)
Glucose-Capillary: 176 mg/dL — ABNORMAL HIGH (ref 70–99)

## 2018-02-03 LAB — CBC WITH DIFFERENTIAL/PLATELET
Abs Immature Granulocytes: 0.1 10*3/uL (ref 0.0–0.1)
Basophils Absolute: 0.1 10*3/uL (ref 0.0–0.1)
Basophils Relative: 1 %
EOS ABS: 0.6 10*3/uL (ref 0.0–0.7)
EOS PCT: 6 %
HEMATOCRIT: 42.5 % (ref 39.0–52.0)
Hemoglobin: 12.6 g/dL — ABNORMAL LOW (ref 13.0–17.0)
IMMATURE GRANULOCYTES: 1 %
Lymphocytes Relative: 12 %
Lymphs Abs: 1 10*3/uL (ref 0.7–4.0)
MCH: 25.8 pg — ABNORMAL LOW (ref 26.0–34.0)
MCHC: 29.6 g/dL — ABNORMAL LOW (ref 30.0–36.0)
MCV: 87.1 fL (ref 78.0–100.0)
MONO ABS: 0.8 10*3/uL (ref 0.1–1.0)
MONOS PCT: 9 %
NEUTROS PCT: 71 %
Neutro Abs: 6.3 10*3/uL (ref 1.7–7.7)
PLATELETS: 276 10*3/uL (ref 150–400)
RBC: 4.88 MIL/uL (ref 4.22–5.81)
RDW: 15.7 % — ABNORMAL HIGH (ref 11.5–15.5)
WBC: 8.8 10*3/uL (ref 4.0–10.5)

## 2018-02-03 MED ORDER — GLUCERNA 1.2 CAL PO LIQD
1000.0000 mL | ORAL | Status: DC
  Administered 2018-02-03 – 2018-02-23 (×28): 1000 mL
  Filled 2018-02-03 (×43): qty 1000

## 2018-02-03 NOTE — Progress Notes (Signed)
Occupational Therapy Session Note  Patient Details  Name: Adam Yang MRN: [id number] Date of Birth: 03/02/1946  Today's Date: 02/03/2018 OT Individual Time: 5747-3403 OT Individual Time Calculation (min): 27 min   Skilled Therapeutic Interventions/Progress Updates:  Pt greeted in TIS, asleep, easily woken. Spouse and dtr present. Took pt to dayroom for increasing stimulation. Meaningful music used therapeutically for similar purpose. Worked on [employer] R UE. Pt unable to visually attend to affected side during exercises without max vcs. Exhibited Lt gaze preference. Educated family on importance of providing sensory input to affected side and facilitating ROM exercises for NMR and improving attention to Rt. Family receptive to education and asked appropriate questions. Once he was escorted back to room, provided pt with shower cap to wash hair. Per family, hair washing was a very important part of his routine. Pt assisting with lathering hair in cap, eyes closed, smiling, and making relaxed muttering noises. Pt left with RN at session exit.    Therapy Documentation Precautions:  Precautions Precautions: Fall Precaution Comments: R hemi, R inattention Restrictions Weight Bearing Restrictions: No Vital Signs: Therapy Vitals Pulse Rate: 89 Resp: 16 BP: (!) 153/84 Patient Position (if appropriate): Sitting Oxygen Therapy SpO2: 98 % O2 Device: Room Air Pain: Pain Assessment Pain Scale: Faces Faces Pain Scale: No hurt ADL:   Vision Baseline Vision/History: Wears glasses Wears Glasses: At all times Vision Assessment?: Vision impaired- to be further tested in functional context Additional Comments: Lt gaze preference, is able to track to Rt visual field with increased time and stimulus. Difficult to fully assess vision due to aphasia with decreased ability to follow directions Perception  Perception: Impaired Inattention/Neglect: Does not attend to right visual field;Does not  attend to right side of body     See Function Navigator for Current Functional Status.   Therapy/Group: Individual Therapy  Noheli Melder A Rhyland Hinderliter 02/03/2018, 4:09 PM

## 2018-02-03 NOTE — Progress Notes (Signed)
Notified wife concerning patient fall and new order for CT Scan

## 2018-02-03 NOTE — Progress Notes (Signed)
After long discussion with speech therapy plan as needed for long-term nutritional support with placement of gastrostomy tube.  Family is in agreement to this.  Will place consult to interventional radiology to schedule for gastrostomy tube

## 2018-02-03 NOTE — Progress Notes (Signed)
Restless during intervals of shift, Nod in respond to some questions, Cortrak tube to left nare continue Jevity 1.2 at 60 cc/hr tolerate well, High Risk Fall,Closely monitor No acute distress NPO

## 2018-02-03 NOTE — Progress Notes (Signed)
Social Work  Social Work Assessment and Plan  Patient Details  Name: Adam Yang MRN: [id number] Date of Birth: 1946/01/22  Today's Date: 02/03/2018  Problem List:  Patient Active Problem List   Diagnosis Date Noted  . Hypertensive emergency 02/02/2018  . Intraparenchymal hemorrhage of brain ([place]) 02/02/2018  . Hemiparesis affecting right side as late effect of stroke ([place])   . Aphasia, post-stroke   . Dysphagia, post-stroke   . Nontraumatic subcortical hemorrhage of left cerebral hemisphere ([place])   . Diabetes mellitus type 2 in nonobese (HCC)   . AKI (acute kidney injury) ([place])   . Hyponatremia   . Chronic kidney disease   . Cytotoxic brain edema ([place]) 01/28/2018  . Brain herniation ([place]) 01/28/2018  . IVH (intraventricular hemorrhage) ([place]) 01/27/2018  . Right inguinal hernia 08/23/2017  . Chronic kidney disease, stage III (moderate) ([place]) 02/18/2017  . 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 Other fatigue 06/18/2016  . Essential hypertension 10/17/2015  . Mucositis due to chemotherapy 01/24/2015  . Hyperglycemia due to type 2 diabetes mellitus ([place]) 01/07/2015  . Chronic anxiety 12/19/2014  . Stage 2 chronic renal impairment associated with type 2 diabetes mellitus ([place]) 12/19/2014  . Type 2 diabetes mellitus, controlled, with renal complications ([place]) 78/29/5621  . History of nasopharyngeal cancer 11/26/2014   Past Medical History:  Past Medical History:  Diagnosis Date  . Anxiety    Claustrophobia  . Arthritis   . Cancer (HCC)    nasopharynx  . Chronic cough    EXCESS MUCUS BUILDUP SINCE ORAL SURGERY FROM CANCER   . Chronic kidney disease    Renal Insuffiency , Creatinine has been in the 2- 3 range, 11/27/14 Creatine 1.4  . Diabetes mellitus without complication ([place])    Type II  . Gout   . History of radiation therapy 12/23/2014- 02/10/2015   Nasopharynx / Neck Adenopathy  . Hyperlipidemia   . Hypertension    . Inguinal hernia   . Right inguinal hernia 08/23/2017   Past Surgical History:  Past Surgical History:  Procedure Laterality Date  . colonoscopy    . COLONOSCOPY W/ POLYPECTOMY    . INGUINAL HERNIA REPAIR Right 08/23/2017   Procedure: OPEN REPAIR RIGHT INGUINAL HERNIA WITH MESH;  Surgeon: Fanny Skates, MD;  Location: [facility];  Service: General;  Laterality: Right;  GENERAL AND TAP BLOCK  . INSERTION OF MESH Right 08/23/2017   Procedure: INSERTION OF MESH;  Surgeon: Fanny Skates, MD;  Location: [facility];  Service: General;  Laterality: Right;  GENERAL AND TAP BLOCK  . KNEE SURGERY Left 1990's   tibia crushed, pinned and bone grafted  . LYMPH NODE BIOPSY    . MULTIPLE EXTRACTIONS WITH ALVEOLOPLASTY N/A 12/06/2014   Procedure: Extraction of tooth #'s 2,4,5,6,7,8,9,10,11,12,13,14,15, 20,22,26,27,28,29 with alveoloplasty.;  Surgeon: Lenn Cal, DDS;  Location: [facility];  Service: Oral Surgery;  Laterality: N/A;   Social History:  reports that he quit smoking about 19 years ago. He has a 25.00 pack-year smoking history. He has never used smokeless tobacco. He reports that he does not drink alcohol or use drugs.  Family / Support Systems Marital Status: Married Patient Roles: Spouse, Parent, Other (Comment)(Church Retail banker) Spouse/Significant Other: Cathy [phone]  [phone] Children: Summer-daughter [phone] recently moved here and in with from [place] Other Supports: [place] members Anticipated Caregiver: Wife and daughter Ability/Limitations of Caregiver: None Caregiver Availability: 24/7 Family Dynamics: Close knit family daughter and son in-law have recenlty moved back home  from [place] and in with pt and wife. They have a very strong church present who provides much support to both of them.   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 Will look toward his wife to make any decision while here.   Abuse/Neglect Abuse/Neglect Assessment Can Be Completed: Yes Physical Abuse: Denies Verbal Abuse: Denies Sexual Abuse: Denies Exploitation of patient/patient's resources: Denies Self-Neglect: Denies  Emotional Status Pt's affect, behavior adn adjustment status: Pt is motivated to do well and becomes frustrated with his inability to talk and express his needs. He has always been independent even after his throat cancer. He has established and new normal according to wife. She will assist but knows if he can he would rather be independent. Recent Psychosocial Issues: throat cancer-cancer free for three years. Pyschiatric History: History of anxiety takes medicine for this and finds it helpful. Wife does feel he would benefit from seeing neuro-psych while here, once he is more communicative.  Substance Abuse History: No issues  Patient / Family Perceptions, Expectations & Goals Pt/Family understanding of illness & functional limitations: Wife is a retired Therapist, sports and well aware of his stroke and deficits. She feels they have been thorugh this and now need to start over. She does talk with the MD and feels she has a good understanding of his condition and treatment plan going forward. Premorbid pt/family roles/activities: Husband, father, church deacon, retiree, friend, etc Anticipated changes in roles/activities/participation: resume Pt/family expectations/goals: Pt attempts to communicate but gestures and grunts. Wife states: " He is frustrated and is trying to talk, I can get some of it, but do want him to do well here."  [employer]: Other (Comment)([facility]) Premorbid Home Care/DME Agencies: Other (Comment)(has had OP in the past for SP) Transportation available at discharge: Wife and daughter Resource referrals recommended: Neuropsychology, Support group (specify)  Discharge Planning Living Arrangements: Spouse/significant other, Children, Other relatives Support Systems: Spouse/significant other, Children, Other relatives, Friends/neighbors, Church/faith community Type of Residence: Private residence Insurance Resources: Multimedia programmer (specify)([employer]) Museum/gallery curator Resources: Fish farm manager, Other (Comment)(pension from [employer]) Financial Screen Referred: Yes Living Expenses: Motgage Money Management: Spouse Does the patient have any problems obtaining your medications?: Yes (Describe)(Financial concerns would like resources) Home Management: Wife Patient/Family Preliminary Plans: Return home with wife and now daughter who has recently moved in from [place]. Pt will have 24 hr care if needed. Awaiting team's evaluations and will work on discharge needs. Supportive family and friends will need to be limited while here due to pt being exhausted after yesterday. Social Work Anticipated Follow Up Needs: HH/OP, Support Group  Clinical Impression Pleasant family who is willing to do whatever pt needs. Has been through this four years ago with his throat cancer,had been cancer free for three years up until this happen. Both concerned about finances and would like resources due to finances very tight. Pt would also benefit from seeing neuro-psych when appropriate for his anxiety issues. Will get input from speech when he will be appropriate. Work on discharge needs.  Elease Hashimoto 02/03/2018, 1:49 PM

## 2018-02-03 NOTE — Progress Notes (Addendum)
Initial Nutrition Assessment  DOCUMENTATION CODES:   Non-severe (moderate) malnutrition in context of chronic illness  INTERVENTION:  Discontinue Jevity 1.2 formula.  Initiate Glucerna 1.2 formula @ 35 ml/hr via Cortrak NGT and increase by 10 ml every 4 hours to goal rate of 75 ml/hr x 20 hours (may hold TF for up to 4 hours for therapy).   Continue free water flushes of 200 ml QID. (MD to adjust as appropriate)  Tube feeding regimen provides 1800 kcal (100% of needs), 90 grams of protein, and 2015 ml of H2O.   NUTRITION DIAGNOSIS:   Moderate Malnutrition related to chronic illness(known hx cancer with dysphagia) as evidenced by moderate fat depletion, moderate muscle depletion.  GOAL:   Patient will meet greater than or equal to 90% of their needs  MONITOR:   PO intake, Weight trends, Labs, Skin, I & O's, TF tolerance  REASON FOR ASSESSMENT:   Consult Enteral/tube feeding initiation and management  ASSESSMENT:   72 year old right-handed male with history of hypertension, type 2 diabetes mellitus, CKD, nasopharynx cancer with radiation therapy and remote tobacco abuse.  Presented 01/26/2018 with right-sided weakness as well as facial droop and bradycardia. 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 Patient currently NPO with a Cortrak tube for nutritional support.    Per PA, plans for long term nutritional support. IR has been consulted for PEG placement.    Pt with no teeth. Wife at bedside reports pt consumes a pureed diet at home with protein shakes daily. Pureed diets consume of chicken, vegetable, and mashed potatoes. Weight has been stable ~140 lbs. Pt currently has Jevity 1.2 formula infusing and has been tolerating it. Wife concerned about pt's elevated blood sugars and request formula change to Glucerna 1.2 formula to better maintain blood glucose. RD to modify orders.   Labs: Sodium elevated at 157. CBG's 98-172 mg/dL.   NUTRITION - FOCUSED PHYSICAL  EXAM:    Most Recent Value  Orbital Region  Moderate depletion  Upper Arm Region  Mild depletion  Thoracic and Lumbar Region  Unable to assess  Buccal Region  Moderate depletion  Temple Region  Moderate depletion  Clavicle Bone Region  Severe depletion  Clavicle and Acromion Bone Region  Severe depletion  Scapular Bone Region  Unable to assess  Dorsal Hand  Unable to assess  Patellar Region  Moderate depletion  Anterior Thigh Region  Moderate depletion  Posterior Calf Region  Moderate depletion  Edema (RD Assessment)  None  Hair  Reviewed  Eyes  Reviewed  Mouth  Reviewed [no teeth]  Skin  Reviewed  Nails  Reviewed       Diet Order:   Diet Order           Diet NPO time specified  Diet effective now          EDUCATION NEEDS:   Not appropriate for education at this time  Skin:  Skin Assessment: Reviewed RN Assessment  Last BM:  7/25  Height:   Ht Readings from Last 1 Encounters:  02/02/18 5\' 9"  (1.753 m)    Weight:   Wt Readings from Last 1 Encounters:  02/03/18 138 lb 14.2 oz (63 kg)    Ideal Body Weight:  72.7 kg  BMI:  Body mass index is 20.51 kg/m.  Estimated Nutritional Needs:   Kcal:  1750-1900  Protein:  80-95 grams  Fluid:  >/= 1.7 L/day    Corrin Parker, MS, RD, LDN Pager # [phone] After hours/ weekend pager # [phone]

## 2018-02-03 NOTE — Care Management Note (Signed)
Inpatient Rehabilitation Center Individual Statement of Services  Patient Name:  Adam Yang  Date:  02/03/2018  Welcome to the [facility].  Our goal is to provide you with an individualized program based on your diagnosis and situation, designed to meet your specific needs.  With this comprehensive rehabilitation program, you will be expected to participate in at least 3 hours of rehabilitation therapies Monday-Friday, with modified therapy programming on the weekends.  Your rehabilitation program will include the following services:  Physical Therapy (PT), Occupational Therapy (OT), Speech Therapy (ST), 24 hour per day rehabilitation nursing, Case Management (Social Worker), Rehabilitation Medicine, Nutrition Services and Pharmacy Services  Weekly team conferences will be held on Wednesday to discuss your progress.  Your Social Worker will talk with you frequently to get your input and to update you on team discussions.  Team conferences with you and your family in attendance may also be held.  Expected length of stay: 21-24 days  Overall anticipated outcome: min-mod level of assist  Depending on your progress and recovery, your program may change. Your Social Worker will coordinate services and will keep you informed of any changes. Your Social Worker's name and contact numbers are listed  below.  The following services may also be recommended but are not provided by the [facility] will be made to provide these services after discharge if needed.  Arrangements include referral to agencies that provide these services.  Your insurance has been verified to be:  [employer] Your primary doctor is:  Lona Kettle  Pertinent information will be shared with your doctor and your insurance company.  Social Worker:  Ovidio Kin, [place] or ([phone]  Information discussed with and copy given to patient by: Elease Hashimoto, 02/03/2018, 9:11 AM

## 2018-02-03 NOTE — Progress Notes (Signed)
Back to department w/o noted distress or c/o, respiration unlabored, incontinent care provided,restarted Tube feeding per setting,/Family at bedside m made as comfortable as possible, wife request placement of condom catheter and provided so that patient can get some rest. HOB elevated, reassurance provided by staff, Continue to monitor per safety fall protocol and standards, call bell placed to left side of bed, Continue regime

## 2018-02-03 NOTE — Progress Notes (Signed)
Transfer to CT for xray via bed, RN and spouse no acute distress or discomfort,

## 2018-02-03 NOTE — Progress Notes (Signed)
   02/03/18 1907  What Happened  Was fall witnessed? No  Was patient injured? No  Patient found on floor  Found by Staff-comment Threasa Beards, RN)  Stated prior activity other (comment) (patient slid between bedrails)  Follow Up  MD notified Delice Lesch  Time MD notified [phone]  Family notified Yes-comment  Time family notified 1930  Additional tests No  Progress note created (see row info) Yes  Adult Fall Risk Assessment  Risk Factor Category (scoring not indicated) Fall has occurred during this admission (document High fall risk)  Patient's Fall Risk High Fall Risk (>13 points)  Adult Fall Risk Interventions  Required Bundle Interventions *See Row Information* High fall risk - low, moderate, and high requirements implemented  Additional Interventions Lap belt while in chair/wheelchair;Reorient/diversional activities with confused patients;Use of appropriate toileting equipment (bedpan, BSC, etc.)  Screening for Fall Injury Risk (To be completed on HIGH fall risk patients) - Assessing Need for Low Bed  Risk For Fall Injury- Low Bed Criteria TeleSitter Camera in use  Will Implement Low Bed and Floor Mats Low bed contraindicated, floor mats in place  Screening for Fall Injury Risk (To be completed on HIGH fall risk patients who do not meet crieteria for Low Bed) - Assessing Need for Floor Mats Only  Risk For Fall Injury- Criteria for Floor Mats Confusion/dementia (+CAM, CIWA, TBI, etc.)  Will Implement Floor Mats Yes  Vitals  Temp 97.7 F (36.5 C)  Temp Source Oral  BP 136/78  MAP (mmHg) 95  BP Location Left Arm  BP Method Automatic  Patient Position (if appropriate) Lying  Pulse Rate 96  Resp 20  Oxygen Therapy  SpO2 99 %  O2 Device Room Air  Pain Assessment  Pain Scale Faces  Faces Pain Scale 0  Neurological  Neuro (WDL) X  Level of Consciousness Alert  Orientation Level Oriented to person;Disoriented to place;Disoriented to time;Disoriented to situation  Cognition Poor  attention/concentration;Poor judgement;Poor safety awareness  Speech Incomprehensible;Expressive aphasia  RUE Motor Response No movment to painful stimulus  RUE Sensation Decreased  RUE Motor Strength 0  LUE Motor Response Purposeful movement  LUE Sensation Full sensation  LUE Motor Strength 4  RLE Motor Response No movement to painful stimulus  RLE Sensation Decreased  RLE Motor Strength 0  LLE Motor Response Purposeful movement  LLE Sensation Full sensation  LLE Motor Strength 3  Integumentary  Integumentary (WDL) WDL

## 2018-02-03 NOTE — Progress Notes (Signed)
Found sitting on floor next to bed . Bed alarm on, side rails up times three, door opened bed in lowest position.Alert, responds appropriately. VSS, Dr Posey Pronto notified, orders taken.

## 2018-02-03 NOTE — IPOC Note (Signed)
Overall Plan of Care [facility]) Patient Details Name: Adam Yang MRN: [id number] DOB: 08/17/1945  Admitting Diagnosis: <principal problem not specified>  Hospital Problems: Active Problems:   Intraparenchymal hemorrhage of brain (HCC)   Hemiparesis affecting right side as late effect of stroke (HCC)   Aphasia, post-stroke   Dysphagia, post-stroke     Functional Problem List: Nursing Bladder, Bowel, Endurance, Medication Management, Nutrition, Pain, Safety, Skin Integrity, Sensory  PT Balance, Perception, Safety, Behavior, Endurance, Sensory, Motor, Pain  OT Balance, Cognition, Endurance, Motor, Perception, Safety, Sensory, Skin Integrity, Vision  SLP Cognition, Motor, Nutrition, Endurance  TR         Basic ADL's: OT Grooming, Bathing, Dressing, Toileting     Advanced  ADL's: OT       Transfers: PT Bed Mobility, Car, Bed to Chair  OT Toilet, Tub/Shower     Locomotion: PT Ambulation, Wheelchair Mobility     Additional Impairments: OT Fuctional Use of Upper Extremity  SLP Swallowing, Communication, Social Cognition comprehension, expression Problem Solving, Attention  TR      Anticipated Outcomes Item Anticipated Outcome  Self Feeding    Swallowing  Only prefunctional goals set for swallow function - no PO/diet goals set at this time d/t high risk of aspiration   Basic self-care  Min assist  Toileting  Min assist   Bathroom Transfers Min assist  Bowel/Bladder  Patient will be continent of bowel and bladder with min assist  Transfers  min assist with LRAD, except car transfer mod assist  Locomotion  supervision w/c level  Communication  Mod A with multimodal basic communication  Cognition  Min A   Pain  Pain less han or equal to 4/10 with min assist  Safety/Judgment  Patient will be free from injury/falls and displaying sound safety judgement with min assist   Therapy Plan: PT Intensity: Minimum of 1-2 x/day ,45 to 90 minutes PT Frequency: 5 out of 7  days PT Duration Estimated Length of Stay: 3-4 weeks OT Intensity: Minimum of 1-2 x/day, 45 to 90 minutes OT Frequency: 5 out of 7 days OT Duration/Estimated Length of Stay: 24-28 days SLP Intensity: Minumum of 1-2 x/day, 30 to 90 minutes SLP Frequency: 3 to 5 out of 7 days SLP Duration/Estimated Length of Stay: 21 to 24 days    Team Interventions: Nursing Interventions Patient/Family Education, Bladder Management, Bowel Management, Disease Management/Prevention, Pain Management, Medication Management, Dysphagia/Aspiration Precaution Training, Discharge Planning  PT interventions Ambulation/gait training, DME/adaptive equipment instruction, Neuromuscular re-education, Psychosocial support, Stair training, UE/LE Strength taining/ROM, Wheelchair propulsion/positioning, UE/LE Coordination activities, Therapeutic Activities, Skin care/wound management, Pain management, Functional electrical stimulation, Discharge planning, Balance/vestibular training, Cognitive remediation/compensation, Disease management/prevention, Functional mobility training, Splinting/orthotics, Therapeutic Exercise, Visual/perceptual remediation/compensation, Patient/family education  OT Interventions Balance/vestibular training, Cognitive remediation/compensation, Discharge planning, Disease mangement/prevention, DME/adaptive equipment instruction, Functional mobility training, Neuromuscular re-education, Pain management, Patient/family education, Psychosocial support, Self Care/advanced ADL retraining, Skin care/wound managment, Splinting/orthotics, Therapeutic Activities, Therapeutic Exercise, UE/LE Strength taining/ROM, UE/LE Coordination activities, Visual/perceptual remediation/compensation, Wheelchair propulsion/positioning  SLP Interventions Functional tasks, Multimodal communication approach, Patient/family education, Therapeutic Activities, Oral motor exercises, Internal/external aids, Speech/Language facilitation  TR  Interventions    SW/CM Interventions Discharge Planning, Psychosocial Support, Patient/Family Education   Barriers to Discharge MD  Medical stability and Nutritional means  Nursing      PT Incontinence, Nutrition means    OT Incontinence, Nutrition means    SLP Nutrition means NPO - will need long term alternative nutrition  SW       Team  Discharge Planning: Destination: PT-Home ,OT- Home , SLP-Home Projected Follow-up: PT-Home health PT, 24 hour supervision/assistance, OT-  24 hour supervision/assistance, Home health OT, SLP-24 hour supervision/assistance, Outpatient SLP Projected Equipment Needs: PT-To be determined, OT- 3 in 1 bedside comode, Tub/shower bench, SLP-None recommended by SLP Equipment Details: PT-probably w/c and potentially hospital bed, OT-  Patient/family involved in discharge planning: PT- Patient, Family member/caregiver,  OT-Patient, Family member/caregiver, SLP-Patient  MD ELOS: 22-25d Medical Rehab Prognosis:  Fair Assessment:  72 year old right-handed male with history of hypertension, type 2 diabetes mellitus, CKD, nasopharynx cancer with radiation therapy and remote tobacco abuse. Presented 01/26/2018 with right-sided weakness as well as facial droop and bradycardia. Urine drug screen negative. 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 Vasogenic edema and mass-effect with a 4 mm midline shift and effacement of the left ventricle. CT angiogram of head and neck showed no acute or focal vascular lesion to explain the patient's hemorrhage. No definite enhancement or mass lesion. Echocardiogram with ejection fraction of 70% no wall motion abnormalities. No cardiac source of emboli. Patient did not receive TPA secondary to hemorrhage. Follow-up neurology services latest repeat cranial CT scan 01/28/2018 showing no significant interval change in size of hemorrhage. No hydrocephalus or ventricular trapping. Noted on 02/01/2018 patient  with episode of hypotension requiring bolus of IV fluids with subsequent improvement in blood pressure as well as mental status. Patient currently NPOwith a Cortraktube for nutritional support   Now requiring 24/7 Rehab RN,MD, as well as CIR level PT, OT and SLP.  Treatment team will focus on ADLs and mobility with goals set at [place] A, will need PEG See Team Conference Notes for weekly updates to the plan of care

## 2018-02-03 NOTE — Evaluation (Signed)
Speech Language Pathology Assessment and Plan  Patient Details  Name: Adam Yang MRN: [id number] Date of Birth: [birth year]  SLP Diagnosis: Aphasia;Dysarthria;Dysphagia;Speech and Language deficits;Cognitive Impairments  Rehab Potential: Fair(Poor for return to PO intake) ELOS: 21 to 24 days    Today's Date: 02/03/2018 SLP Individual Time: 6578-4696 SLP Individual Time Calculation (min): 60 min   Problem List:  Patient Active Problem List   Diagnosis Date Noted  . Hypertensive emergency 02/02/2018  . Intraparenchymal hemorrhage of brain ([place]) 02/02/2018  . Hemiparesis affecting right side as late effect of stroke ([place])   . Aphasia, post-stroke   . Dysphagia, post-stroke   . Nontraumatic subcortical hemorrhage of left cerebral hemisphere ([place])   . Diabetes mellitus type 2 in nonobese (HCC)   . AKI (acute kidney injury) ([place])   . Hyponatremia   . Chronic kidney disease   . Cytotoxic brain edema ([place]) 01/28/2018  . Brain herniation ([place]) 01/28/2018  . IVH (intraventricular hemorrhage) ([place]) 01/27/2018  . Right inguinal hernia 08/23/2017  . Chronic kidney disease, stage III (moderate) ([place]) 02/18/2017  . 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 Other fatigue 06/18/2016  . Essential hypertension 10/17/2015  . Mucositis due to chemotherapy 01/24/2015  . Hyperglycemia due to type 2 diabetes mellitus ([place]) 01/07/2015  . Chronic anxiety 12/19/2014  . Stage 2 chronic renal impairment associated with type 2 diabetes mellitus ([place]) 12/19/2014  . Type 2 diabetes mellitus, controlled, with renal complications ([place]) 29/52/8413  . History of nasopharyngeal cancer 11/26/2014   Past Medical History:  Past Medical History:  Diagnosis Date  . Anxiety    Claustrophobia  . Arthritis   . Cancer (HCC)    nasopharynx  . Chronic cough    EXCESS MUCUS BUILDUP SINCE ORAL SURGERY FROM CANCER   . Chronic kidney disease    Renal Insuffiency ,  Creatinine has been in the 2- 3 range, 11/27/14 Creatine 1.4  . Diabetes mellitus without complication ([place])    Type II  . Gout   . History of radiation therapy 12/23/2014- 02/10/2015   Nasopharynx / Neck Adenopathy  . Hyperlipidemia   . Hypertension   . Inguinal hernia   . Right inguinal hernia 08/23/2017   Past Surgical History:  Past Surgical History:  Procedure Laterality Date  . colonoscopy    . COLONOSCOPY W/ POLYPECTOMY    . INGUINAL HERNIA REPAIR Right 08/23/2017   Procedure: OPEN REPAIR RIGHT INGUINAL HERNIA WITH MESH;  Surgeon: Fanny Skates, MD;  Location: [facility];  Service: General;  Laterality: Right;  GENERAL AND TAP BLOCK  . INSERTION OF MESH Right 08/23/2017   Procedure: INSERTION OF MESH;  Surgeon: Fanny Skates, MD;  Location: [facility];  Service: General;  Laterality: Right;  GENERAL AND TAP BLOCK  . KNEE SURGERY Left 1990's   tibia crushed, pinned and bone grafted  . LYMPH NODE BIOPSY    . MULTIPLE EXTRACTIONS WITH ALVEOLOPLASTY N/A 12/06/2014   Procedure: Extraction of tooth #'s 2,4,5,6,7,8,9,10,11,12,13,14,15, 20,22,26,27,28,29 with alveoloplasty.;  Surgeon: Lenn Cal, DDS;  Location: [facility];  Service: Oral Surgery;  Laterality: N/A;    Assessment / Plan / Recommendation Clinical Impression Presented 01/26/2018 with right-sided weakness as well as facial droop and bradycardia. 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  Vasogenic edema and mass-effect with a 4 mm midline shift and effacement of the left ventricle.  CT angiogram of head and neck showed no acute or focal vascular lesion  to explain the patient's hemorrhage.    Pt has history nasopharynx cancer with radiation therapy. As a result he has extensive history of oropharyngeal dysphagia, history of PEG placement prior for nutritional support (no longer in place) and was able to return to pureed diet with thin liquids after entensive Outpatient ST services. At baseline, pt was  likely able to compensate for post radiation changes to swallow mechanism but is no longer able to do so in setting of acute CVA. Currently pt presents with decreased mass of tongue, tongue weakness and right facial flaccidity. MBS on 01/27/18 revealed severe oropharyngeal dysphagia c/b by severe oral phase deficits and pharyngeal deficits that resulted in aspiration of majority of boluses. Swallow mechanism continues to be profoundly impacted by signs of neuromuscular weakness as well as fibrosis from cancer radiation. Per chart review, pt has history of globus sensation with esophagram revealing nonspecific esophageal motility disorder with extensive tertiary contractions. Lack of bolus movement within upper esophagus can be seen on recent MBS as well.  Trials at bedside continue to yield significant s/s of aspiration and would recommend targeting base of tongue and pharyngeal strengthening exercises and timely swallow initiation with oral stim. Given severity of aspiration risk, would be extremely cautious with any PO trials at bedside and would focus ST services on communication and cognition. Given that pt is alert and eager to participate in sessions deficits in swallow function cannot be attributed to cognitive deficits, making pt's prognosis poorer for return to PO intake. Also given severity of of s/s of aspiration at beside, another instrumental swallow study is not indicated. This Probation officer had conversation with MD regarding extremely poor prognosis of returning to safe PO intake and need for long-term alternative means of nutrition or Palliative care consult if family doesn't want PEG placement.   In addition to severe oropharyneal dysphagia, pt also presents with severe expressive aphasia that is further complicated by dysarthria d/t right facial flaccidity and baseline decreased tongue mass. Pt's votional verbal expression is c/b decreased initiaiton of communication, unrecognizable garbled speech,  inability to indicate yes/no (both gesturally, nonverbally or verbally), some subtle differences in facial expression with tasks but not communicative. Pt does not use gestures to communicate. Comprehension appears less iimpaired as he is able to follow basic intuitive directions during bed mobility tasks etc. Overall cognitive function is c/b orientation to himself/name, appropriate sustained attention and appropriate social interaction (pleasant, desires to participate). Skilled ST is required to target the above mentioned deficits and increase functional independence. If pt's famliy decides on aggressive treatment (i.e., PEG placement) it is likely that pt will require extensive follow-up with Outpatient ST.      Skilled Therapeutic Interventions          Skilled treatment session focused on completion of BSE and SLE, see above. Pt able to demonstrate functional use of common objects, unable to select requested object in field of two d/t decreased understanding of task. Pt with immediate coughing on small trial of room temperature thin water via spoon.   Later in morning, pt's wife and daughter were in to visit. SLP provided extensive education including video feedback of MBS and aspiration. They state to consuming PO intake was laborious prior to CVA. They have recently education from Dr Leonie Man that would need PEG. They are prepared for PEG placement and would like to proceed with placement. Education provided that goals were not set for PO intake during this admission and they were agreeable. Education also provided about language  and POC. All questions answered to their satisfaction. Information given to PA that family is requesting PEG.    SLP Assessment  Patient will need skilled Speech Lanaguage Pathology Services during CIR admission    Recommendations  SLP Diet Recommendations: NPO;Alternative means - long-term Medication Administration: Via alternative means Oral Care Recommendations: Oral  care QID(With  use of suction) Recommendations for Other Services: Neuropsych consult(for family support and understanding of deficits) Patient destination: Home Follow up Recommendations: 24 hour supervision/assistance;Outpatient SLP Equipment Recommended: None recommended by SLP    SLP Frequency 3 to 5 out of 7 days   SLP Duration  SLP Intensity  SLP Treatment/Interventions 21 to 24 days  Minumum of 1-2 x/day, 30 to 90 minutes  Functional tasks;Multimodal communication approach;Patient/family education;Therapeutic Activities;Oral motor exercises;Internal/external aids;Speech/Language facilitation    Pain    Prior Functioning Cognitive/Linguistic Baseline: Information not available Type of Home: House  Lives With: Spouse;Daughter Available Help at Discharge: Family;Available 24 hours/day Vocation: Retired  Function:  Eating Eating   Modified Consistency Diet: (I trial of thin liquid)             Cognition Comprehension Comprehension assist level: Understands basic 25 - 49% of the time/ requires cueing 50 - 75% of the time  Expression   Expression assist level: Expresses basis less than 25% of the time/requires cueing >75% of the time.  Social Interaction Social Interaction assist level: Interacts appropriately 25 - 49% of time - Needs frequent redirection.  Problem Solving Problem solving assist level: Solves basic 25 - 49% of the time - needs direction more than half the time to initiate, plan or complete simple activities  Memory Memory assist level: (Difficult to assess d/t communication deficits)   Short Term Goals: Week 1: SLP Short Term Goal 1 (Week 1): Pt will utilize multimodal means to communicate basic wants and needs in 75% of opportunties with Mod A cues.  SLP Short Term Goal 2 (Week 1): Pt answer yes/no questions using multimodal communication in 5 out of 10 opportunities with Max A cues.  SLP Short Term Goal 3 (Week 1): Pt will imitate common sounds/  phrases with multimodal cues and Max A level of support.  SLP Short Term Goal 4 (Week 1): Pt will complete basic problem solving tasks related to ADLs with Min A cues.  SLP Short Term Goal 5 (Week 1): Pt will perform pharyngeal strengthening exercises with Max A cues.  SLP Short Term Goal 6 (Week 1): Given oral stim, pt will initiate timely swallow with Max A cues to perserve function and for participation in dysphagia therpay.   Refer to Care Plan for Long Term Goals  Recommendations for other services: Neuropsych  Discharge Criteria: Patient will be discharged from SLP if patient refuses treatment 3 consecutive times without medical reason, if treatment goals not met, if there is a change in medical status, if patient makes no progress towards goals or if patient is discharged from hospital.  The above assessment, treatment plan, treatment alternatives and goals were discussed and mutually agreed upon: by patient and by family  Shadiyah Wernli 02/03/2018, 11:13 AM

## 2018-02-03 NOTE — Progress Notes (Addendum)
PMR Admission Coordinator Pre-Admission Assessment  Patient: Adam Yang is an 72 y.o., male MRN: [id number] DOB: May 31, 1946 Height: 5\' 9"  (175.3 cm) Weight: 62 kg (136 lb 11 oz)                                                                                                                                                  Insurance Information HMO: X    PPO:      PCP:      IPA:      80/20:      OTHER:  PRIMARY: BCBS Medicare       Policy#: [id number]      Subscriber: Self CM Name: Pieter Partridge.     Phone#: [phone]     Fax#: [phone] Pre-Cert#: [id number] Received initial verbal authorization 02/01/18 from Pieter Partridge. with request to call after admission to receive authorization number [id number] for dates 02/02/18-02/09/18 with faxed updates due to Santiago Glad on 7/32/19.                        Employer: Retired  Benefits:  Phone #: [phone]     Name: Verified via phone call on 01/30/18 with Nel B.  Eff. Date: 07/12/17     Deduct: $0      Out of Pocket Max: [phone]      Life Max: N/A CIR: $310 a day, days 1-6; $0 a day, days 7+      SNF: $0 a day, days 1-20; $172 a day, days 21-60; $0 a day, days 61-100 Outpatient: PT, OT, SLP (authorization required)     Co-Pay: $40 per visit  Home Health: PT, OT, SLP (authorization required)      Co-Pay: $0 DME: 80%     Co-Pay: 20% Providers: In-network   SECONDARY: None        Emergency Contact Information         Contact Information    Name Relation Home Work Mobile   Rood,Cathy Spouse [phone]  [phone]   [place], [place] Daughter   [phone]     Current Medical History  Patient Admitting Diagnosis: 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 Presented 01/26/2018 with right-sided weakness  as well as facial droop and bradycardia. Urine drug screen negative. 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 Vasogenic edema and mass-effect with a 4 mm midline shift and effacement of the left ventricle. CT angiogram of head and neck showed no acute or focal vascular lesion to explain the patient's hemorrhage. No definite enhancement or mass lesion. Echocardiogram with ejection fraction of 70% no wall motion abnormalities. No cardiac source of emboli.  Patient did not receive TPA secondary to hemorrhage. Follow-up neurology services latest repeat cranial CT scan 01/28/2018 showing no significant interval change in size of hemorrhage. No hydrocephalus or ventricular trapping. Noted on 02/01/2018 patient with episode of hypotension requiring bolus of IV fluids with subsequent improvement in blood pressure as well as mental status. Patient currently NPOwith a Cortraktube for nutritional support.Physical and occupational therapy evaluations completed with recommendations of physical medicine rehab consult. Patient was admitted for a comprehensive rehab program 02/02/18.  NIH Total: 20  Past Medical History      Past Medical History:  Diagnosis Date  . Anxiety    Claustrophobia  . Arthritis   . Cancer (HCC)    nasopharynx  . Chronic cough    EXCESS MUCUS BUILDUP SINCE ORAL SURGERY FROM CANCER   . Chronic kidney disease    Renal Insuffiency , Creatinine has been in the 2- 3 range, 11/27/14 Creatine 1.4  . Diabetes mellitus without complication ([place])    Type II  . Gout   . History of radiation therapy 12/23/2014- 02/10/2015   Nasopharynx / Neck Adenopathy  . Hyperlipidemia   . Hypertension   . Inguinal hernia   . Right inguinal hernia 08/23/2017    Family History  family history includes Cancer in his brother, father, and mother; Heart block in his brother.  Prior Rehab/Hospitalizations:  Has the patient had major surgery during  100 days prior to admission? No, last surgery 08/23/17  Current Medications   Current Facility-Administered Medications:  .  0.9 %  sodium chloride infusion, , Intravenous, Continuous, Garvin Fila, MD, Last Rate: 50 mL/hr at 02/02/18 0904 .  0.9 %  sodium chloride infusion, 500 mL, Intravenous, Continuous, Garvin Fila, MD, Stopped at 01/31/18 1728 .  amLODipine (NORVASC) tablet 10 mg, 10 mg, Oral, Daily, Garvin Fila, MD, 10 mg at 02/02/18 0901 .  artificial tears (LACRILUBE) ophthalmic ointment, , Both Eyes, Q3H PRN, Garvin Fila, MD .  chlorhexidine (PERIDEX) 0.12 % solution 15 mL, 15 mL, Mouth Rinse, BID, Garvin Fila, MD, 15 mL at 02/02/18 0901 .  labetalol (NORMODYNE,TRANDATE) injection 20 mg, 20 mg, Intravenous, Once **AND** clevidipine (CLEVIPREX) infusion 0.5 mg/mL, 0-21 mg/hr, Intravenous, Continuous, Garvin Fila, MD, Stopped at 01/29/18 1114 .  feeding supplement (JEVITY 1.2 CAL) liquid 1,000 mL, 1,000 mL, Per Tube, Continuous, Garvin Fila, MD, Last Rate: 60 mL/hr at 02/02/18 0600 .  feeding supplement (PRO-STAT SUGAR FREE 64) liquid 30 mL, 30 mL, Per Tube, Daily, Garvin Fila, MD, 30 mL at 02/02/18 0901 .  fenofibrate tablet 160 mg, 160 mg, Oral, Daily, Garvin Fila, MD, 160 mg at 02/02/18 0901 .  insulin aspart (novoLOG) injection 0-15 Units, 0-15 Units, Subcutaneous, Q4H, Greta Doom, MD, 2 Units at 02/02/18 0830 .  labetalol (NORMODYNE,TRANDATE) injection 10 mg, 10 mg, Intravenous, Q10 min PRN, Greta Doom, MD .  lisinopril (PRINIVIL,ZESTRIL) tablet 20 mg, 20 mg, Oral, Daily, Garvin Fila, MD, 20 mg at 02/01/18 2029 .  MEDLINE mouth rinse, 15 mL, Mouth Rinse, Q2H, Garvin Fila, MD, 15 mL at 02/02/18 0537 .  multivitamin liquid 15 mL, 15 mL, Per Tube, Daily, Garvin Fila, MD, 15 mL at 02/02/18 0901 .  nystatin (MYCOSTATIN) 100000 UNIT/ML suspension 500,000 Units, 5 mL, Mouth/Throat, QID, Garvin Fila, MD, 500,000 Units  at 02/02/18 0901 .  polyethylene glycol (MIRALAX / GLYCOLAX) packet 17 g, 17 g, Oral, Daily, Sethi, Lucy [place], MD, 17 g  at 02/02/18 0901 .  senna-docusate (Senokot-S) tablet 1 tablet, 1 tablet, Oral, BID, Garvin Fila, MD, 1 tablet at 02/02/18 0914 .  tiZANidine (ZANAFLEX) tablet 4 mg, 4 mg, Oral, QHS, Sethi, Pramod S, MD, 4 mg at 02/01/18 2240  Patients Current Diet:       Diet Order           Diet NPO time specified  Diet effective now          Precautions / Restrictions Precautions Precautions: Fall Restrictions Weight Bearing Restrictions: No   Has the patient had 2 or more falls or a fall with injury in the past year?No  Prior Activity Level Community (5-7x/wk): Prior to admission patient was fully independent and enjoyed gardening, movies, computer, and was a Retail banker at [employer].  He is a retired Print production planner at [employer], retired Patent examiner, and is s/p nasopharynx cancer with radiation 3 years ago.  He recieved SLP treatment at outpatient and had progressed to eating Dys.1-Dys.2 textures at home.    Home Assistive Devices / Equipment Home Assistive Devices/Equipment: Dentures (specify type)(upper and lower) Home Equipment: Shower seat  Prior Device Use: Indicate devices/aids used by the patient prior to current illness, exacerbation or injury? None of the above  Prior Functional Level Prior Function Level of Independence: Independent Comments: retired Education officer, museum, enjoys gardening, enjoys computer work,   Self Care: Did the patient need help bathing, dressing, using the toilet or eating? Independent  Indoor Mobility: Did the patient need assistance with walking from room to room (with or without device)? Independent  Stairs: Did the patient need assistance with internal or external stairs (with or without device)? Independent  Functional Cognition: Did the patient need help planning regular tasks such as shopping or remembering to take  medications? Independent  Current Functional Level Cognition  Overall Cognitive Status: Difficult to assess Difficult to assess due to: Impaired communication Orientation Level: Oriented to place, Oriented to person General Comments: More consistently following verbal one step commands; min attempts at answering questions Attention: Sustained Sustained Attention: Appears intact    Extremity Assessment (includes Sensation/Coordination)  Upper Extremity Assessment: RUE deficits/detail RUE Deficits / Details: flaccid brunstrom I RUE Sensation: WNL  Lower Extremity Assessment: Defer to PT evaluation RLE Deficits / Details: Hip flexor, knee extensor, ankle dorsiflexion 2/5; tending to hyperextend R knee in standing; minimal to no RLE muscle activation with weight shift onto RLE    ADLs  Overall ADL's : Needs assistance/impaired Eating/Feeding: NPO Grooming: Wash/dry face, Moderate assistance, Sitting Grooming Details (indicate cue type and reason): used LUE Upper Body Bathing: Maximal assistance Lower Body Bathing: Total assistance Upper Body Dressing : Maximal assistance Lower Body Dressing: Total assistance, Bed level Lower Body Dressing Details (indicate cue type and reason): to don socks Toilet Transfer: Moderate assistance, +2 for physical assistance, +2 for safety/equipment Toilet Transfer Details (indicate cue type and reason): simulated through sit <>stand from bed and single leg raises in standing Functional mobility during ADLs: Maximal assistance, +2 for physical assistance, +2 for safety/equipment General ADL Comments: focus of session on sitting and standing balance and maintaining midline position; pt requiring up to maxA for static sitting balance, sitting EOB approx 7-8 min. Transferred to recliner via stand pivot with maxA+2. Additional practice on sit<>stand and standing balance using LUE support on regular chair and pt requiring maxA+2 to maintain static standing  balance; utilized mirror for visual feedback during sitting and standing activity     Mobility  Overal bed  mobility: Needs Assistance Bed Mobility: Supine to Sit Rolling: Max assist Sidelying to sit: Max assist, +2 for safety/equipment Supine to sit: Max assist General bed mobility comments: pt able to bring L LE to EOB and assist to elevate trunk with L UE; use of rail to roll; assistance required to bring R LE and hips to EOB and to elevate trunk into sitting     Transfers  Overall transfer level: Needs assistance Equipment used: 2 person hand held assist(use of gait belt ) Transfers: Sit to/from Stand, Stand Pivot Transfers Sit to Stand: +2 physical assistance, Mod assist, Max assist Stand pivot transfers: Max assist, +2 physical assistance General transfer comment: pt requires assistance to power up into standing and to maintain balance upon standing with R knee blocked and facilitation at pelvis and trunk for upright posture and weight shifting for pivot to recliner; completed additional sit<>stand from recliner using LUE support on chair placed in front of pt, completing with mod-maxA+2     Ambulation / Gait / Stairs / Wheelchair Mobility  Ambulation/Gait Ambulation/Gait assistance: Max assist, +2 physical assistance General Gait Details: Pre-gait activities in standing; worked on R single limb stance with R knee supported anteriorly to prevent buckle and supported knee posteriorly to prevent hyperextension in stance; facilitated weight shifts onto RLE with brief L foot pickups    Posture / Balance Dynamic Sitting Balance Sitting balance - Comments: pushes to R with stronger LUE Balance Overall balance assessment: Needs assistance Sitting balance-Leahy Scale: Poor Sitting balance - Comments: pushes to R with stronger LUE Standing balance-Leahy Scale: Zero    Special needs/care consideration BiPAP/CPAP: No CPM: No Continuous Drip IV: No Dialysis: No         Life Vest:  No Oxygen: No Special Bed: No Trach Size: No Wound Vac (area): No       Skin: WDL per chart review                               Bowel mgmt: No documented BM since admission 01/26/18, nursing has administered laxatives and stool softeners with no result yet. Per RN report 02/02/18 patient passing gas. Bladder mgmt: Incontinent with external foley in place  Diabetic mgmt: No, HgbA1c: 6.0     Previous Home Environment Living Arrangements: Spouse/significant other  Lives With: Spouse, Daughter Available Help at Discharge: Family, Available 24 hours/day Type of Home: House Home Layout: Multi-level, Able to live on main level with bedroom/bathroom Alternate Level Stairs-Number of Steps: flight Home Access: Ramped entrance Bathroom Shower/Tub: Chiropodist: Standard Home Care Services: No Additional Comments: Unable to glean information about PLOF or home setup from pt  Discharge Living Setting Plans for Discharge Living Setting: Patient's home, Lives with (comment)(Spouse, daughter, and son-in-law) Type of Home at Discharge: House Discharge Home Layout: Multi-level, Able to live on main level with bedroom/bathroom Discharge Home Access: Ramped entrance(with threshold from porch into kitchen ) Discharge Bathroom Shower/Tub: Tub/shower unit, Curtain Discharge Bathroom Toilet: Standard Discharge Bathroom Accessibility: Yes How Accessible: Accessible via walker(maybe wheelchair per spouse's report ) Does the patient have any problems obtaining your medications?: Yes (Describe)(Spouse expressed finincial concerns and requested to speak with acute CSW, notified Kathlee Nations CSW with request for her to follow up with patient's spouse)  Social/Family/Support Systems Patient Roles: Spouse, Parent, 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 Information: Spouse: Secondary school teacher  Anticipated Caregiver: Spouse: Tye [place], Daughter: Summer and son-in-law Anticipated Garment/textile technologist  Information: Valeta Harms: [phone] Ability/Limitations of Caregiver: None Caregiver Availability: 24/7 Discharge Plan Discussed with Primary Caregiver: Yes Is Caregiver In Agreement with Plan?: Yes Does Caregiver/Family have Issues with Lodging/Transportation while Pt is in Rehab?: No  Goals/Additional Needs Patient/Family Goal for Rehab: PT/OT/SLP: Min-Mod A Expected length of stay: 25-30 days  Cultural Considerations: Christian, prayer is important to them  Dietary Needs: NPO with Cortrak and history of dysphagia  Equipment Needs: TBD Pt/Family Agrees to Admission and willing to participate: Yes Program Orientation Provided & Reviewed with Pt/Caregiver Including Roles  & Responsibilities: Yes Additional Information Needs: Daughter and son-in-law recently moved back in with them from [place] daughter currently not working  Information Needs to be Provided By: Team FYI  Barriers to Discharge: Medical stability, Home environment access/layout, Nutrition means(Spouse expressed concerns about bathroom set-up)  Decrease burden of Care through IP rehab admission: Recommended by Rehab MD for [facility] equipment needs, Diet advancement, Decrease number of caregivers, Bowel and bladder program and Patient/family education; however, given insurer is a managed medicare provider discussed with patient, spouse, and daughter that they needed to choose CIR versus SNF.  They choose CIR with plans to discharge home with home health being anticipated as post CIR follow-up needs.    Possible need for SNF placement upon discharge: Not anticipated   Patient Condition: This patient's medical and functional status has changed since the consult dated: 01/30/18 in which the Rehabilitation Physician determined and documented that the patient's condition is appropriate for intensive rehabilitative care in an inpatient rehabilitation facility. See "History of Present Illness" (above) for medical update.  Functional changes are: Mod-Max A +2 transfers. Patient's medical and functional status update has been discussed with the Rehabilitation physician and patient remains appropriate for inpatient rehabilitation. Will admit to inpatient rehab today.  Preadmission Screen Completed By:  Gunnar Fusi, 02/02/2018 12:04 PM ______________________________________________________________________   Discussed status with Dr. Letta Pate on 02/02/18 at 17 and received telephone approval for admission today.  Admission Coordinator:  Gunnar Fusi, time 1200/Date 02/02/18             Cosigned by: Charlett Blake, MD at 02/02/2018 12:09 PM  Revision History

## 2018-02-03 NOTE — Evaluation (Signed)
Physical Therapy Assessment and Plan  Patient Details  Name: Adam Yang MRN: [id number] Date of Birth: 1946/02/14  PT Diagnosis: Abnormal posture, Abnormality of gait, Coordination disorder, Difficulty walking, Hemiparesis non-dominant, Impaired cognition and Muscle weakness Rehab Potential: Fair ELOS: 3-4 weeks   Today's Date: 02/03/2018 PT Individual Time: 1105-1205 PT Individual Time Calculation (min): 60 min    Problem List:  Patient Active Problem List   Diagnosis Date Noted  . Hypertensive emergency 02/02/2018  . Intraparenchymal hemorrhage of brain ([place]) 02/02/2018  . Hemiparesis affecting right side as late effect of stroke ([place])   . Aphasia, post-stroke   . Dysphagia, post-stroke   . Nontraumatic subcortical hemorrhage of left cerebral hemisphere ([place])   . Diabetes mellitus type 2 in nonobese (HCC)   . AKI (acute kidney injury) ([place])   . Hyponatremia   . Chronic kidney disease   . Cytotoxic brain edema ([place]) 01/28/2018  . Brain herniation ([place]) 01/28/2018  . IVH (intraventricular hemorrhage) ([place]) 01/27/2018  . Right inguinal hernia 08/23/2017  . Chronic kidney disease, stage III (moderate) ([place]) 02/18/2017  . 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 Other fatigue 06/18/2016  . Essential hypertension 10/17/2015  . Mucositis due to chemotherapy 01/24/2015  . Hyperglycemia due to type 2 diabetes mellitus ([place]) 01/07/2015  . Chronic anxiety 12/19/2014  . Stage 2 chronic renal impairment associated with type 2 diabetes mellitus ([place]) 12/19/2014  . Type 2 diabetes mellitus, controlled, with renal complications ([place]) 74/02/1447  . History of nasopharyngeal cancer 11/26/2014    Past Medical History:  Past Medical History:  Diagnosis Date  . Anxiety    Claustrophobia  . Arthritis   . Cancer (HCC)    nasopharynx  . Chronic cough    EXCESS MUCUS BUILDUP SINCE ORAL SURGERY FROM CANCER   . Chronic kidney disease     Renal Insuffiency , Creatinine has been in the 2- 3 range, 11/27/14 Creatine 1.4  . Diabetes mellitus without complication ([place])    Type II  . Gout   . History of radiation therapy 12/23/2014- 02/10/2015   Nasopharynx / Neck Adenopathy  . Hyperlipidemia   . Hypertension   . Inguinal hernia   . Right inguinal hernia 08/23/2017   Past Surgical History:  Past Surgical History:  Procedure Laterality Date  . colonoscopy    . COLONOSCOPY W/ POLYPECTOMY    . INGUINAL HERNIA REPAIR Right 08/23/2017   Procedure: OPEN REPAIR RIGHT INGUINAL HERNIA WITH MESH;  Surgeon: Fanny Skates, MD;  Location: [facility];  Service: General;  Laterality: Right;  GENERAL AND TAP BLOCK  . INSERTION OF MESH Right 08/23/2017   Procedure: INSERTION OF MESH;  Surgeon: Fanny Skates, MD;  Location: [facility];  Service: General;  Laterality: Right;  GENERAL AND TAP BLOCK  . KNEE SURGERY Left 1990's   tibia crushed, pinned and bone grafted  . LYMPH NODE BIOPSY    . MULTIPLE EXTRACTIONS WITH ALVEOLOPLASTY N/A 12/06/2014   Procedure: Extraction of tooth #'s 2,4,5,6,7,8,9,10,11,12,13,14,15, 20,22,26,27,28,29 with alveoloplasty.;  Surgeon: Lenn Cal, DDS;  Location: [facility];  Service: Oral Surgery;  Laterality: N/A;    Assessment & Plan Clinical Impression: Patient is a 72 y.o. year old male with history of hypertension, type 2 diabetes mellitus, CKD, nasopharynx cancer with radiation therapy and remote tobacco abuse. Presented 01/26/2018 with right-sided weakness as well as facial droop and bradycardia. Urine drug screen negative. 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  Vasogenic edema and mass-effect with a 4 mm midline shift and effacement of the left ventricle. CT angiogram of head and neck showed no acute or focal vascular lesion to explain the patient's hemorrhage. No definite enhancement or mass lesion. Echocardiogram with ejection fraction of 70% no wall motion abnormalities. No  cardiac source of emboli. Patient did not receive TPA secondary to hemorrhage. Follow-up neurology services latest repeat cranial CT scan 01/28/2018 showing no significant interval change in size of hemorrhage. No hydrocephalus or ventricular trapping. Noted on 02/01/2018 patient with episode of hypotension requiring bolus of IV fluids with subsequent improvement in blood pressure as well as mental status. Patient currently NPOwith a Cortraktube for nutritional support.Physical and occupational therapy evaluations completed with recommendations of physical medicine rehab consult. Patient was admitted for a comprehensive rehab program.  Patient transferred to CIR on 02/02/2018 .   Patient currently requires total with mobility secondary to muscle weakness, decreased cardiorespiratoy endurance, decreased coordination, decreased visual perceptual skills, decreased attention to right, decreased initiation, decreased attention, decreased awareness, decreased problem solving, decreased safety awareness, decreased memory and delayed processing, and decreased sitting balance, decreased standing balance, decreased postural control, hemiplegia and decreased balance strategies.  Prior to hospitalization, patient was independent  with mobility and lived with Spouse, Daughter in a House home.  Home access is  Ramped entrance(family recently installed ramp).  Patient will benefit from skilled PT intervention to maximize safe functional mobility, minimize fall risk and decrease caregiver burden for planned discharge home with 24 hour assist.  Anticipate patient will benefit from follow up [facility] at discharge.  PT - End of Session Activity Tolerance: Tolerates 30+ min activity with multiple rests Endurance Deficit: Yes Endurance Deficit Description: 2/2 generalized weakness PT Assessment Rehab Potential (ACUTE/IP ONLY): Fair PT Barriers to Discharge: Incontinence;Nutrition means PT Patient demonstrates impairments  in the following area(s): Balance;Perception;Safety;Behavior;Endurance;Sensory;Motor;Pain PT Transfers Functional Problem(s): Bed Mobility;Car;Bed to Chair PT Locomotion Functional Problem(s): Ambulation;Wheelchair Mobility PT Plan PT Intensity: Minimum of 1-2 x/day ,45 to 90 minutes PT Frequency: 5 out of 7 days PT Duration Estimated Length of Stay: 3-4 weeks PT Treatment/Interventions: Ambulation/gait training;DME/adaptive equipment instruction;Neuromuscular re-education;Psychosocial support;Stair training;UE/LE Strength taining/ROM;Wheelchair propulsion/positioning;UE/LE Coordination activities;Therapeutic Activities;Skin care/wound management;Pain management;Functional electrical stimulation;Discharge planning;Balance/vestibular training;Cognitive remediation/compensation;Disease management/prevention;Functional mobility training;Splinting/orthotics;Therapeutic Exercise;Visual/perceptual remediation/compensation;Patient/family education PT Transfers Anticipated Outcome(s): min assist with LRAD, except car transfer mod assist PT Locomotion Anticipated Outcome(s): supervision w/c level PT Recommendation Follow Up Recommendations: Home health PT;24 hour supervision/assistance Patient destination: Home Equipment Recommended: To be determined Equipment Details: probably w/c and potentially hospital bed  Skilled Therapeutic Intervention Pt received in bed with only behaviors demonstrating pain when therapist attempted to fully extend R knee. Pt essentially nonverbal throughout session but able to communicate somewhat with gestures. Pt requires max assist for sitting balance EOB and total assist for supine<>sit and bed<>w/c via stand pivot. Pt is able to initiate movements but unable to execute full task 2/2 impaired cognition, sequencing, R inattention and strength and mobility deficits, see below for additional details. Provided pt with TIS w/c and cushion for increased postioning and to increase OOB  tolerance. Pt's wife & daughter arrived during session with therapist educating them on ELOS, weekly interdisciplinary team meetings, anticipated goals, pt's CLOF, and other various CIR information. Pt is able to ambulate with +2 total assist & w/c follow, somewhat initiating step with RLE but limited by deficits as noted below. At end of session pt left sitting in w/c in room with family present to supervise.   PT Evaluation  Precautions/Restrictions Precautions Precautions: Fall Precaution Comments: R hemi, R inattention Restrictions Weight Bearing Restrictions: No  General Chart Reviewed: Yes Additional Pertinent History: HTN, DM 2, CKD, nasopharynx CA, arthritis, gout Response to Previous Treatment: Patient unable to report, no changes reported from family or staff Family/Caregiver Present: Yes(wife Tye [place]), daughter (Summer))  Home Living/Prior Functioning Home Living Available Help at Discharge: Family;Available 24 hours/day Type of Home: House Home Access: Ramped entrance(family recently installed ramp) Home Layout: Multi-level;Able to live on main level with bedroom/bathroom  Lives With: Spouse;Daughter Prior Function Level of Independence: Independent with basic ADLs;Independent with transfers;Independent with gait;Independent with homemaking with ambulation  Able to Take Stairs?: Yes Driving: Yes Vocation: Retired Leisure: Hobbies-yes (Comment) Comments: retired Education officer, museum, enjoys gardening, enjoys computer work, enjoys listening to music  Vision/Perception  Pt wears glasses at all times at baseline. Unable to assess current vision 2/2 cognitive deficits. Pt demonstrates R inattention to body & space.  Cognition Overall Cognitive Status: Impaired/Different from baseline Arousal/Alertness: Awake/alert Memory: Impaired Awareness: Impaired Problem Solving: Impaired Safety/Judgment: Impaired  Sensation Unable to formally test 2/2 cognitive  deficits. Coordination Gross Motor Movements are Fluid and Coordinated: No Fine Motor Movements are Fluid and Coordinated: No  Motor  Motor Motor: Hemiplegia;Abnormal postural alignment and control   Mobility Bed Mobility Bed Mobility: Supine to Sit;Sit to Supine(HOB elevated, bed rails) Supine to Sit: Total Assistance - Patient < 25% Sit to Supine: Total Assistance - Patient < 25% Transfers Transfers: Sit to Stand;Stand Pivot Transfers Sit to Stand: Total Assistance - Patient < 25% Stand Pivot Transfers: Total Assistance - Patient < 25% Stand Pivot Transfer Details: Tactile cues for initiation;Tactile cues for weight shifting;Tactile cues for placement;Verbal cues for technique;Manual facilitation for weight shifting;Manual facilitation for placement;Verbal cues for precautions/safety;Verbal cues for sequencing;Tactile cues for sequencing;Tactile cues for posture  Locomotion  Gait Ambulation: Yes Gait Assistance: (2 person assist via 3 muskateers + w/c follow for safety) Gait Distance (Feet): 3 Feet Gait Gait: Yes Gait Pattern: Impaired Gait Pattern: (pt somewhat able to intiate stepping with RLE, R LE knee buckling & hyperextension, requires assistance to advance RLE, decreased weight shifting R, decreased step length LLE) Stairs / Additional Locomotion Stairs: No Wheelchair Mobility Wheelchair Mobility: No   Trunk/Postural Assessment  Cervical Assessment Cervical Assessment: Exceptions to WFL(slight forward head) Thoracic Assessment Thoracic Assessment: Exceptions to WFL(slightly rounded shoulders) Lumbar Assessment Lumbar Assessment: Exceptions to WFL(posterior pelvic tilt) Postural Control Postural Control: Deficits on evaluation Trunk Control: impaired, pushing R/R lateral lean Righting Reactions: impaired in sitting Protective Responses: impaired in sitting   Balance Balance Balance Assessed: Yes Static Sitting Balance Static Sitting - Balance Support: Left  upper extremity supported;Feet supported Static Sitting - Level of Assistance: 2: Max assist  Extremity Assessment  RUE Assessment RUE Assessment: Exceptions to [facility] LUE Assessment LUE Assessment: Within Functional Limits RLE Assessment RLE Assessment: Exceptions to [facility] Passive Range of Motion (PROM) Comments: (WFL but pain with full knee extension) General Strength Comments: no active movement noted except in standing pt somewhat able to initiate step with RLE LLE Assessment LLE Assessment: Within Functional Limits -- unable to formally test strength 2/2 cognitive & communication impairments   See Function Navigator for Current Functional Status.   Refer to Care Plan for Long Term Goals  Recommendations for other services: None   Discharge Criteria: Patient will be discharged from PT if patient refuses treatment 3 consecutive times without medical reason, if treatment goals not met, if there is a change in medical status, if  patient makes no progress towards goals or if patient is discharged from hospital.  The above assessment, treatment plan, treatment alternatives and goals were discussed and mutually agreed upon: by patient and by family  [place] 02/03/2018, 12:42 PM

## 2018-02-03 NOTE — Progress Notes (Signed)
Dr Posey Pronto informed of CT results by Zachery Conch, RN,

## 2018-02-03 NOTE — Evaluation (Signed)
Occupational Therapy Assessment and Plan  Patient Details  Name: Adam Yang MRN: [id number] Date of Birth: 1946/06/22  OT Diagnosis: abnormal posture, cognitive deficits, disturbance of vision, flaccid hemiplegia and hemiparesis, hemiplegia affecting non-dominant side and muscle weakness (generalized) Rehab Potential: Rehab Potential (ACUTE ONLY): Fair ELOS: 24-28 days   Today's Date: 02/03/2018 OT Individual Time: 8756-4332 OT Individual Time Calculation (min): 63 min     Problem List:  Patient Active Problem List   Diagnosis Date Noted  . Hypertensive emergency 02/02/2018  . Intraparenchymal hemorrhage of brain ([place]) 02/02/2018  . Hemiparesis affecting right side as late effect of stroke ([place])   . Aphasia, post-stroke   . Dysphagia, post-stroke   . Nontraumatic subcortical hemorrhage of left cerebral hemisphere ([place])   . Diabetes mellitus type 2 in nonobese (HCC)   . AKI (acute kidney injury) ([place])   . Hyponatremia   . Chronic kidney disease   . Cytotoxic brain edema ([address]) 01/28/2018  . Brain herniation ([place]) 01/28/2018  . IVH (intraventricular hemorrhage) ([place]) 01/27/2018  . Right inguinal hernia 08/23/2017  . Chronic kidney disease, stage III (moderate) ([place]) 02/18/2017  . 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 Other fatigue 06/18/2016  . Essential hypertension 10/17/2015  . Mucositis due to chemotherapy 01/24/2015  . Hyperglycemia due to type 2 diabetes mellitus ([place]) 01/07/2015  . Chronic anxiety 12/19/2014  . Stage 2 chronic renal impairment associated with type 2 diabetes mellitus ([place]) 12/19/2014  . Type 2 diabetes mellitus, controlled, with renal complications ([place]) 95/18/8416  . History of nasopharyngeal cancer 11/26/2014    Past Medical History:  Past Medical History:  Diagnosis Date  . Anxiety    Claustrophobia  . Arthritis   . Cancer (HCC)    nasopharynx  . Chronic cough    EXCESS MUCUS BUILDUP  SINCE ORAL SURGERY FROM CANCER   . Chronic kidney disease    Renal Insuffiency , Creatinine has been in the 2- 3 range, 11/27/14 Creatine 1.4  . Diabetes mellitus without complication ([place])    Type II  . Gout   . History of radiation therapy 12/23/2014- 02/10/2015   Nasopharynx / Neck Adenopathy  . Hyperlipidemia   . Hypertension   . Inguinal hernia   . Right inguinal hernia 08/23/2017   Past Surgical History:  Past Surgical History:  Procedure Laterality Date  . colonoscopy    . COLONOSCOPY W/ POLYPECTOMY    . INGUINAL HERNIA REPAIR Right 08/23/2017   Procedure: OPEN REPAIR RIGHT INGUINAL HERNIA WITH MESH;  Surgeon: Fanny Skates, MD;  Location: [facility];  Service: General;  Laterality: Right;  GENERAL AND TAP BLOCK  . INSERTION OF MESH Right 08/23/2017   Procedure: INSERTION OF MESH;  Surgeon: Fanny Skates, MD;  Location: [facility];  Service: General;  Laterality: Right;  GENERAL AND TAP BLOCK  . KNEE SURGERY Left 1990's   tibia crushed, pinned and bone grafted  . LYMPH NODE BIOPSY    . MULTIPLE EXTRACTIONS WITH ALVEOLOPLASTY N/A 12/06/2014   Procedure: Extraction of tooth #'s 2,4,5,6,7,8,9,10,11,12,13,14,15, 20,22,26,27,28,29 with alveoloplasty.;  Surgeon: Lenn Cal, DDS;  Location: [place];  Service: Oral Surgery;  Laterality: N/A;    Assessment & Plan Clinical Impression: Patient is a 72 y.o. left handed male with history of hypertension, type 2 diabetes mellitus, CKD, nasopharynx cancer with radiation therapy and remote tobacco abuse. Presented 01/26/2018 with right-sided weakness as well as facial droop and bradycardia. Urine drug screen negative. Cranial CT scan showed large  hemorrhagic infarction 7.8 x 4.4 x 5.2 cm. Vasogenic edema and mass-effect with a 4 mm midline shift and effacement of the left ventricle. CT angiogram of head and neck showed no acute or focal vascular lesion to explain the patient's hemorrhage. No definite enhancement or mass lesion. Echocardiogram  with ejection fraction of 70% no wall motion abnormalities. No cardiac source of emboli. Patient did not receive TPA secondary to hemorrhage. Follow-up neurology services latest repeat cranial CT scan 01/28/2018 showing no significant interval change in size of hemorrhage. No hydrocephalus or ventricular trapping. Noted on 02/01/2018 patient with episode of hypotension requiring bolus of IV fluids with subsequent improvement in blood pressure as well as mental status. Patient currently NPOwith a Cortraktube for nutritional support.Physical and occupational therapy evaluations completed with recommendations of physical medicine rehab consult. Patient was admitted for a comprehensive rehab program.    Patient transferred to CIR on 02/02/2018 .    Patient currently requires total with basic self-care skills secondary to muscle weakness, decreased cardiorespiratoy endurance, abnormal tone and unbalanced muscle activation, decreased visual perceptual skills, decreased midline orientation and right side neglect, decreased attention, decreased awareness, decreased problem solving and decreased memory and decreased sitting balance, decreased standing balance, decreased postural control, hemiplegia and decreased balance strategies.  Prior to hospitalization, patient could complete ADLs with independent .  Patient will benefit from skilled intervention to decrease level of assist with basic self-care skills prior to discharge home with care partner.  Anticipate patient will require 24 hour supervision and minimal physical assistance and follow up home health.  OT - End of Session Activity Tolerance: Tolerates 30+ min activity with multiple rests Endurance Deficit: Yes Endurance Deficit Description: due to generalized weakness OT Assessment Rehab Potential (ACUTE ONLY): Fair OT Barriers to Discharge: Incontinence;Nutrition means OT Patient demonstrates impairments in the following area(s):  Balance;Cognition;Endurance;Motor;Perception;Safety;Sensory;Skin Integrity;Vision OT Basic ADL's Functional Problem(s): Grooming;Bathing;Dressing;Toileting OT Transfers Functional Problem(s): Toilet;Tub/Shower OT Additional Impairment(s): Fuctional Use of Upper Extremity OT Plan OT Intensity: Minimum of 1-2 x/day, 45 to 90 minutes OT Frequency: 5 out of 7 days OT Duration/Estimated Length of Stay: 24-28 days OT Treatment/Interventions: Balance/vestibular training;Cognitive remediation/compensation;Discharge planning;Disease mangement/prevention;DME/adaptive equipment instruction;Functional mobility training;Neuromuscular re-education;Pain management;Patient/family education;Psychosocial support;Self Care/advanced ADL retraining;Skin care/wound managment;Splinting/orthotics;Therapeutic Activities;Therapeutic Exercise;UE/LE Strength taining/ROM;UE/LE Coordination activities;Visual/perceptual remediation/compensation;Wheelchair propulsion/positioning OT Basic Self-Care Anticipated Outcome(s): Min assist OT Toileting Anticipated Outcome(s): Min assist OT Bathroom Transfers Anticipated Outcome(s): Min assist OT Recommendation Patient destination: Home Follow Up Recommendations: 24 hour supervision/assistance;Home health OT Equipment Recommended: 3 in 1 bedside comode;Tub/shower bench   Skilled Therapeutic Intervention OT eval completed with discussion of rehab process, OT purpose, POC, ELOS, and goals. Pt's wife and daughter present and involved throughout session as pt globally aphasic and unable to answer any PLOF and home setup questions.  Engaged in ADL assessment seated at sink with focus on initiation, following one step commands, and sequencing during familiar functional tasks of bathing and dressing.  Pt unable to sequence past washing chest and abdomen, requiring hand over hand to attempt to wash RUE but unable to continue due to decreased attention and Rt inattention.  Pt required total  assist for bathing and UB dressing, +2 for LB bathing and dressing at sit > stand level due to pushing to Rt in standing.  Able to complete sit > stand statically with max assist and tactile cues for RLE placement and trunk control.  Pt left reclined in w/c with all needs in reach and RUE supported on pillow as half lap tray unable to  fit on w/c arm rest.    OT Evaluation Precautions/Restrictions  Precautions Precautions: Fall Precaution Comments: R hemi, R inattention Restrictions Weight Bearing Restrictions: No General   Vital Signs Therapy Vitals Pulse Rate: 89 Resp: 16 BP: (!) 153/84 Patient Position (if appropriate): Sitting Oxygen Therapy SpO2: 98 % O2 Device: Room Air Pain Pain Assessment Pain Scale: Faces Faces Pain Scale: No hurt Home Living/Prior Functioning Home Living Living Arrangements: Spouse/significant other, Children, Other relatives Available Help at Discharge: Family, Available 24 hours/day Type of Home: House Home Access: Ramped entrance Home Layout: Multi-level, Able to live on main level with bedroom/bathroom Bathroom Shower/Tub: Chiropodist: Standard  Lives With: Spouse, Daughter Prior Function Level of Independence: Independent with basic ADLs, Independent with transfers, Independent with gait, Independent with homemaking with ambulation  Able to Take Stairs?: Yes Driving: Yes Vocation: Retired Leisure: Hobbies-yes (Comment) Comments: retired Education officer, museum, enjoys gardening, enjoys computer work, enjoys listening to music ADL  See [place] Vision/History: Wears glasses Wears Glasses: At all times Vision Assessment?: Vision impaired- to be further tested in functional context Additional Comments: Lt gaze preference, is able to track to Rt visual field with increased time and stimulus. Difficult to fully assess vision due to aphasia with decreased ability to follow directions Perception  Perception:  Impaired Inattention/Neglect: Does not attend to right visual field;Does not attend to right side of body Cognition Overall Cognitive Status: Impaired/Different from baseline Arousal/Alertness: Awake/alert Memory: Impaired Attention: Sustained Sustained Attention: Appears intact Awareness: Impaired Problem Solving: Impaired Problem Solving Impairment: Verbal basic Behaviors: Restless Safety/Judgment: Impaired Comments: Unable to complete BIMS due to global aphasia Sensation Sensation Light Touch: Impaired Detail Light Touch Impaired Details: Absent RUE Proprioception: Impaired Detail Proprioception Impaired Details: Absent RUE Coordination Gross Motor Movements are Fluid and Coordinated: No Fine Motor Movements are Fluid and Coordinated: No Motor  Motor Motor: Hemiplegia;Abnormal postural alignment and control Mobility  Bed Mobility Bed Mobility: Supine to Sit;Sit to Supine(HOB elevated, bed rails) Supine to Sit: Total Assistance - Patient < 25% Sit to Supine: Total Assistance - Patient < 25% Transfers Sit to Stand: Total Assistance - Patient < 25%  Trunk/Postural Assessment  Cervical Assessment Cervical Assessment: Exceptions to WFL(slight forward head) Thoracic Assessment Thoracic Assessment: Exceptions to WFL(slightly rounded shoulders) Lumbar Assessment Lumbar Assessment: Exceptions to WFL(posterior pelvic tilt) Postural Control Postural Control: Deficits on evaluation Trunk Control: impaired, pushing R/R lateral lean Righting Reactions: impaired in sitting Protective Responses: impaired in sitting  Balance Balance Balance Assessed: Yes Static Sitting Balance Static Sitting - Balance Support: Left upper extremity supported;Feet supported Static Sitting - Level of Assistance: 2: Max assist Extremity/Trunk Assessment RUE Assessment RUE Assessment: Exceptions to [facility] Active Range of Motion (AROM) Comments: absent General Strength Comments: flaccid LUE  Assessment LUE Assessment: Within Functional Limits   See Function Navigator for Current Functional Status.   Refer to Care Plan for Long Term Goals  Recommendations for other services: None    Discharge Criteria: Patient will be discharged from OT if patient refuses treatment 3 consecutive times without medical reason, if treatment goals not met, if there is a change in medical status, if patient makes no progress towards goals or if patient is discharged from hospital.  The above assessment, treatment plan, treatment alternatives and goals were discussed and mutually agreed upon: by patient and by family  Dariann Huckaba, [facility] 02/03/2018, 3:05 PM

## 2018-02-03 NOTE — Progress Notes (Signed)
Physical Medicine and Rehabilitation Consult   Reason for Consult: Stroke with functional deficits.  Referring Physician: Dr. Erlinda Hong  HPI: Adam Yang is a 71 y.o. male with history of HTN, T2DM, CKD, nasopharynx cancer who was admitted on 01/26/18 with right sided weakness, R-HH and right facial weakness.  History taken from chart review and from some friends. CT head reviewed, showing large left hemorrhage with mass effect. 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 He was started on cleviprex and labetalol for BP control. UDS negative. CTA head/neck done and showed L-MCA branch vessels displaced by hemorrhagic mass and no significant stenosis or vascular lesion.   MRI held due to bradycardia.  2 D echo was limited study but showed EF 65-70% with moderately thickened aortic valve and no regional wall abnormality. Follow up CT head was stable except for development of small volume IVH. He remains NPO and was started on tube feeds for nutritional support. He has been weaned off cleviprex and BP medications being adjusted for better control.  Patient with resultant right flaccid hemiparesis, global aphasia, dysarthria and difficulty following commands. Therapy evaluations done this weekend and CIR recommended due to functional deficits.   Review of Systems  Unable to perform ROS: Mental acuity         Past Medical History:  Diagnosis Date  . Anxiety    Claustrophobia  . Arthritis   . Cancer (HCC)    nasopharynx  . Chronic cough    EXCESS MUCUS BUILDUP SINCE ORAL SURGERY FROM CANCER   . Chronic kidney disease    Renal Insuffiency , Creatinine has been in the 2- 3 range, 11/27/14 Creatine 1.4  . Diabetes mellitus without complication ([place])    Type II  . Gout   . History of radiation therapy 12/23/2014- 02/10/2015   Nasopharynx / Neck Adenopathy  . Hyperlipidemia   . Hypertension   . Inguinal hernia   . Right inguinal  hernia 08/23/2017         Past Surgical History:  Procedure Laterality Date  . colonoscopy    . COLONOSCOPY W/ POLYPECTOMY    . INGUINAL HERNIA REPAIR Right 08/23/2017   Procedure: OPEN REPAIR RIGHT INGUINAL HERNIA WITH MESH;  Surgeon: Fanny Skates, MD;  [facility]: [facility];  Service: General;  Laterality: Right;  GENERAL AND TAP BLOCK  . INSERTION OF MESH Right 08/23/2017   Procedure: INSERTION OF MESH;  Surgeon: Fanny Skates, MD;  Location: [facility];  Service: General;  Laterality: Right;  GENERAL AND TAP BLOCK  . KNEE SURGERY Left 1990's   tibia crushed, pinned and bone grafted  . LYMPH NODE BIOPSY    . MULTIPLE EXTRACTIONS WITH ALVEOLOPLASTY N/A 12/06/2014   Procedure: Extraction of tooth #'s 2,4,5,6,7,8,9,10,11,12,13,14,15, 20,22,26,27,28,29 with alveoloplasty.;  Surgeon: Lenn Cal, DDS;  Location: [place];  Service: Oral Surgery;  Laterality: N/A;         Family History  Problem Relation Age of Onset  . Cancer Mother        throat ca  . Cancer Father        lung ca  . Cancer Brother        throat ca  . Heart block Brother     Social History:  Married.  Per reports that he quit smoking about 19 years ago. He has a 25.00 pack-year smoking history. He has never used smokeless tobacco. Per reports he does not drink alcohol or use drugs.    Allergies:  No Known Allergies          Medications Prior to Admission  Medication Sig Dispense Refill  . amLODipine (NORVASC) 5 MG tablet Take 5 mg by mouth daily.     Marland Kitchen guaiFENesin (MUCINEX) 600 MG 12 hr tablet Take by mouth 2 (two) times daily as needed.    . Multiple Vitamins-Minerals (CENTRUM SILVER PO) Take 1 tablet by mouth daily.     . Omega-3 Fatty Acids (FISH OIL PO) Take 1 capsule by mouth daily.     Marland Kitchen tiZANidine (ZANAFLEX) 4 MG tablet Take 4 mg by mouth at bedtime.  4  . fenofibrate 160 MG tablet Take 160 mg by mouth daily.      Home: Home Living Family/patient expects to be  discharged to:: Inpatient rehab Living Arrangements: Spouse/significant other Additional Comments: Unable to glean information about PLOF or home setup from pt  Lives With: Spouse  Functional History: Prior Function Comments: Unknown Functional Status:  Mobility: Bed Mobility Overal bed mobility: Needs Assistance Bed Mobility: Supine to Sit Supine to sit: +2 for physical assistance, Max assist General bed mobility comments: pt with posterior lean to the L initially and needs (A) to elevate trunk Transfers Overall transfer level: Needs assistance Equipment used: 2 person hand held assist Transfers: Stand Pivot Transfers, Sit to/from Stand Sit to Stand: Mod assist, +2 physical assistance, +2 safety/equipment Stand pivot transfers: Mod assist, +2 physical assistance, +2 safety/equipment General transfer comment: pt initiated steppnig to chair on the L side. pt needs R LE blocked. pt shifting all weigh tot he L LE and R LE TDWB  ADL: ADL Overall ADL's : Needs assistance/impaired Eating/Feeding: NPO Grooming: Maximal assistance Upper Body Bathing: Maximal assistance Lower Body Bathing: Total assistance Upper Body Dressing : Maximal assistance Lower Body Dressing: Total assistance Toilet Transfer: +2 for safety/equipment, Moderate assistance General ADL Comments: pt verbalized "yes" several times in the session but not consistently in a correct use. pT reports "yes" when looking out the window "is it raining" pt answer "YES" when asked "is your birthday in [birth year]"  Cognition: Cognition Overall Cognitive Status: Difficult to assess Orientation Level: Other (comment)(expressive aphasia) Attention: Sustained Sustained Attention: Appears intact Cognition Arousal/Alertness: Awake/alert Behavior During Therapy: WFL for tasks assessed/performed Overall Cognitive Status: Difficult to assess General Comments: Answers yes/no questions correclty about 50% of time; makes attempts to  say his name, lots of difficulty Difficult to assess due to: Impaired communication   Blood pressure (!) 181/100, pulse 88, temperature 98 F (36.7 C), temperature source Axillary, resp. rate 20, height 5\' 9"  (1.753 m), weight 63.2 kg (139 lb 5.3 oz), SpO2 98 %. Physical Exam  Nursing note and vitals reviewed. Constitutional: He appears well-developed. He appears lethargic. He appears cachectic.  cortack in nares  HENT:  Head: Normocephalic and atraumatic.  edentulous   Eyes: EOM are normal. Right eye exhibits no discharge. Left eye exhibits no discharge.  Neck: Normal range of motion. Neck supple.  Cardiovascular: Normal rate and regular rhythm.  Respiratory: Effort normal and breath sounds normal.  GI: Soft. Bowel sounds are normal.  Musculoskeletal:  No edema or tenderness in extremities  Neurological: He appears lethargic.  Unintelligible sounds Globally aphasic.  Not following commands. No spontaneous movement noted Right upper extremity/right lower extremity hyperreflexic Right lower extremity clonus  Skin: Skin is warm and dry.  Psychiatric:  Unable to assess due to mentation  Assessment/Plan: Diagnosis: Large left frontoparietal hemorrhagic infarct with vasogenic edema and mass effect Labs and images (see  above) independently reviewed.  Records reviewed and summated above. Stroke: Continue secondary stroke prophylaxis and Risk Factor Modification listed below:   Blood Pressure Management:  Continue current medication with prn's with permisive HTN per primary team Diabetes management:   Right sided hemiparesis: fit for orthosis to prevent contractures (resting hand splint for day, wrist cock up splint at night, PRAFO, etc) Motor recovery: Fluoxetine  1. Does the need for close, 24 hr/day medical supervision in concert with the patient's rehab needs make it unreasonable for this patient to be served in a less intensive setting? Yes 2. Co-Morbidities requiring  supervision/potential complications: HTN (monitor and provide prns in accordance with increased physical exertion and pain), T2 DM (Monitor in accordance with exercise and adjust meds as necessary), AKI on CKD (avoid nephrotoxic meds), nasopharynx cancer, hyponatremia (continue to monitor, treated necessary)  3. Due to bladder management, bowel management, safety, disease management, medication administration and patient education, does the patient require 24 hr/day rehab nursing? Yes 4. Does the patient require coordinated care of a physician, rehab nurse, PT (1-2 hrs/day, 5 days/week), OT (1-2 hrs/day, 5 days/week) and SLP (1-2 hrs/day, 5 days/week) to address physical and functional deficits in the context of the above medical diagnosis(es)? Yes Addressing deficits in the following areas: balance, endurance, locomotion, strength, transferring, bowel/bladder control, bathing, dressing, feeding, grooming, toileting, cognition, speech, language, swallowing and psychosocial support 5. Can the patient actively participate in an intensive therapy program of at least 3 hrs of therapy per day at least 5 days per week? Potentially 6. The potential for patient to make measurable gains while on inpatient rehab is excellent 7. Anticipated functional outcomes upon discharge from inpatient rehab are min assist and mod assist  with PT, min assist and mod assist with OT, min assist and mod assist with SLP. 8. Estimated rehab length of stay to reach the above functional goals is: 25-30 days. 9. Anticipated D/C setting: TBD. 10. Anticipated post D/C treatments: HH therapy and Home excercise program 11. Overall Rehab/Functional Prognosis: good  RECOMMENDATIONS: This patient's condition is appropriate for continued rehabilitative care in the following setting: CIR to decrease burden of care when medically stable. Patient has agreed to participate in recommended program. Potentially Note that insurance prior  authorization may be required for reimbursement for recommended care.  Comment: Rehab Admissions Coordinator to follow up.   I have personally performed a face to face diagnostic evaluation, including, but not limited to relevant history and physical exam findings, of this patient and developed relevant assessment and plan.  Additionally, I have reviewed and concur with the physician assistant's documentation above.   Delice Lesch, MD, ABPMR Bary Leriche, PA-C 01/30/2018          Revision History                        Routing History

## 2018-02-03 NOTE — Progress Notes (Signed)
Subjective/Complaints: Patient remains a phasic.  Speech evaluation discussed with SLP today.  Patient aspirated on all consistencies at bedside. Expecting slow recovery  Review of systems cannot obtain  Objective: Vital Signs: Blood pressure (!) 153/84, pulse 89, temperature 98.5 F (36.9 C), temperature source Oral, resp. rate 16, height '5\' 9"'$  (1.753 m), weight 63 kg (138 lb 14.2 oz), SpO2 98 %. No results found. Results for orders placed or performed during the hospital encounter of 02/02/18 (from the past 72 hour(s))  Glucose, capillary     Status: Abnormal   Collection Time: 02/02/18  8:49 PM  Result Value Ref Range   Glucose-Capillary 104 (H) 70 - 99 mg/dL  Glucose, capillary     Status: Abnormal   Collection Time: 02/03/18  2:06 AM  Result Value Ref Range   Glucose-Capillary 164 (H) 70 - 99 mg/dL  Glucose, capillary     Status: Abnormal   Collection Time: 02/03/18  4:08 AM  Result Value Ref Range   Glucose-Capillary 172 (H) 70 - 99 mg/dL  CBC WITH DIFFERENTIAL     Status: Abnormal   Collection Time: 02/03/18  5:51 AM  Result Value Ref Range   WBC 8.8 4.0 - 10.5 K/uL   RBC 4.88 4.22 - 5.81 MIL/uL   Hemoglobin 12.6 (L) 13.0 - 17.0 g/dL   HCT 42.5 39.0 - 52.0 %   MCV 87.1 78.0 - 100.0 fL   MCH 25.8 (L) 26.0 - 34.0 pg   MCHC 29.6 (L) 30.0 - 36.0 g/dL   RDW 15.7 (H) 11.5 - 15.5 %   Platelets 276 150 - 400 K/uL   Neutrophils Relative % 71 %   Neutro Abs 6.3 1.7 - 7.7 K/uL   Lymphocytes Relative 12 %   Lymphs Abs 1.0 0.7 - 4.0 K/uL   Monocytes Relative 9 %   Monocytes Absolute 0.8 0.1 - 1.0 K/uL   Eosinophils Relative 6 %   Eosinophils Absolute 0.6 0.0 - 0.7 K/uL   Basophils Relative 1 %   Basophils Absolute 0.1 0.0 - 0.1 K/uL   Immature Granulocytes 1 %   Abs Immature Granulocytes 0.1 0.0 - 0.1 K/uL    Comment: Performed at [facility], [address], [place],  [address]  Comprehensive metabolic panel     Status: Abnormal   Collection Time: 02/03/18   5:51 AM  Result Value Ref Range   Sodium 157 (H) 135 - 145 mmol/L   Potassium 3.9 3.5 - 5.1 mmol/L   Chloride 119 (H) 98 - 111 mmol/L   CO2 27 22 - 32 mmol/L   Glucose, Bld 141 (H) 70 - 99 mg/dL   BUN 58 (H) 8 - 23 mg/dL   Creatinine, Ser 2.21 (H) 0.61 - 1.24 mg/dL   Calcium 9.3 8.9 - 10.3 mg/dL   Total Protein 6.9 6.5 - 8.1 g/dL   Albumin 3.2 (L) 3.5 - 5.0 g/dL   AST 40 15 - 41 U/L   ALT 139 (H) 0 - 44 U/L   Alkaline Phosphatase 52 38 - 126 U/L   Total Bilirubin 0.5 0.3 - 1.2 mg/dL   GFR calc non Af Amer 28 (L) >60 mL/min   GFR calc Af Amer 33 (L) >60 mL/min    Comment: (NOTE) The eGFR has been calculated using the CKD EPI equation. This calculation has not been validated in all clinical situations. eGFR's persistently <60 mL/min signify possible Chronic Kidney Disease.    Anion gap 11 5 - 15  Comment: Performed at [facility], [place] [address], [place], [place] [address]  Glucose, capillary     Status: None   Collection Time: 02/03/18  8:00 AM  Result Value Ref Range   Glucose-Capillary 98 70 - 99 mg/dL   Comment 1 Notify RN   Glucose, capillary     Status: Abnormal   Collection Time: 02/03/18 11:47 AM  Result Value Ref Range   Glucose-Capillary 153 (H) 70 - 99 mg/dL   Comment 1 Notify RN      HEENT: Feeding tube right nares Cardio: RRR and No murmur Resp: CTA B/L and Unlabored GI: BS positive and Nontender nondistended Extremity:  No Edema Skin:   Intact Neuro: Alert/Oriented, Abnormal Sensory Unable to assess light touch but does wince with pinch of the right upper and right lower limb, Abnormal Motor 0/5 in the right upper extremity, 0/5 right lower extremity, antigravity plus resistance left upper and left lower extremity, Aphasic and Apraxic Musc/Skel:  Other No pain with upper limb or lower limb range of motion General no acute distress   Assessment/Plan: 1. 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 Physiatrist is providing close team supervision and 24 hour management of active medical problems listed below. Physiatrist and rehab team continue to assess barriers to discharge/monitor patient progress toward functional and medical goals. FIM: Function - Bathing Position: Wheelchair/chair at sink Body parts bathed by patient: Chest, Abdomen Body parts bathed by helper: Right arm, Left arm, Front perineal area, Buttocks, Right upper leg, Left upper leg, Right lower leg, Left lower leg, Back Assist Level: 2 helpers  Function- Upper Body Dressing/Undressing What is the patient wearing?: Button up shirt Button up shirt - Perfomed by helper: Thread/unthread right sleeve, Thread/unthread left sleeve, Button/unbutton shirt, Pull shirt around back Assist Level: (Total assist) Function - Lower Body Dressing/Undressing What is the patient wearing?: Pants, Non-skid slipper socks Position: Wheelchair/chair at sink Pants- Performed by helper: Thread/unthread right pants leg, Thread/unthread left pants leg, Pull pants up/down Non-skid slipper socks- Performed by helper: Don/doff right sock, Don/doff left sock Assist for footwear: Dependant Assist for lower body dressing: 2 Helpers        Function - Chair/bed transfer Chair/bed transfer method: Stand pivot Chair/bed transfer assist level: Total assist (Pt < 25%) Chair/bed transfer details: Tactile cues for initiation, Tactile cues for weight shifting, Tactile cues for posture, Tactile cues for sequencing, Tactile cues for placement, Verbal cues for technique, Verbal cues for sequencing, Manual facilitation for weight shifting, Manual facilitation for placement  Function - Locomotion: Wheelchair Will patient use wheelchair at discharge?: Yes Wheelchair activity did not occur: Safety/medical  concerns Wheel 50 feet with 2 turns activity did not occur: Safety/medical concerns Wheel 150 feet activity did not occur: Safety/medical concerns Turns around,maneuvers to table,bed, and toilet,negotiates 3% grade,maneuvers on rugs and over doorsills: No Function - Locomotion: Ambulation Assistive device: (3 muskateers + w/c follow) Max distance: 3 ft Assist level: 2 helpers Walk 10 feet activity did not occur: Safety/medical concerns Walk 50 feet with 2 turns activity did not occur: Safety/medical concerns Walk 150 feet activity did not occur: Safety/medical concerns Walk 10 feet on uneven surfaces activity did not occur: Safety/medical concerns  Function - Comprehension Comprehension: Auditory Comprehension assist level: Understands basic 25 - 49% of the time/ requires cueing 50 - 75% of the time  Function - Expression Expression: Nonverbal, Verbal Expression assist level: Expresses basis less than 25% of the time/requires cueing >75% of the time.  Function - Social Interaction Social Interaction assist level: Interacts appropriately 25 - 49% of time - Needs frequent redirection.  Function - Problem Solving Problem solving assist level: Solves basic 25 - 49% of the time - needs direction more than half the time to initiate, plan or complete simple activities  Function - Memory Memory assist level: (Difficult to assess d/t communication deficits) Patient normally able to recall (first 3 days only): None of the above  Medical Problem List and Plan: 1.Right side weakness with facial droop and dysphasiasecondary to large left frontoparietal hemorrhagic infarction with vasogenic edema and mass-effect CIR initial eval's with PT OT speech today 2. DVT Prophylaxis/Anticoagulation: SCDs. Monitor for any signs of DVT 3. Pain Management:Zanaflex 4 mg nightly 4. Mood:Provide emotional support 5. Neuropsych: This patientis notcapable of making decisions on hisown behalf. 6.  Skin/Wound Care:Routine skin checks 7. Fluids/Electrolytes/Nutrition:Routine in and outs with follow-up chemistries 8.Dysphagia.NPO/Cortrakin place.  Prognosis for recovery of swallowing is poor.  Recommend PEG.  Likely will be a prolonged recovery.  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 Patient with episode orthostasis 02/01/2018 and continue to monitor. Presently onlisinopril 20 mg daily, Norvasc 10 mg daily. Monitor with increased mobility 10.Diabetes mellitus. Hemoglobin A1c 6.0. SSI. 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 12.Hyperlipidemia. Fenofibrate.  LOS (Days) 1 A FACE TO FACE EVALUATION WAS PERFORMED  Charlett Blake 02/03/2018, 5:07 PM

## 2018-02-04 ENCOUNTER — Inpatient Hospital Stay (HOSPITAL_COMMUNITY): Payer: Medicare Other

## 2018-02-04 ENCOUNTER — Encounter (HOSPITAL_COMMUNITY): Payer: Self-pay | Admitting: Physician Assistant

## 2018-02-04 ENCOUNTER — Inpatient Hospital Stay (HOSPITAL_COMMUNITY): Payer: Medicare Other | Admitting: Physical Therapy

## 2018-02-04 DIAGNOSIS — R4702 Dysphasia: Secondary | ICD-10-CM

## 2018-02-04 DIAGNOSIS — N183 Chronic kidney disease, stage 3 (moderate): Secondary | ICD-10-CM

## 2018-02-04 DIAGNOSIS — E870 Hyperosmolality and hypernatremia: Secondary | ICD-10-CM | POA: Insufficient documentation

## 2018-02-04 DIAGNOSIS — R740 Nonspecific elevation of levels of transaminase and lactic acid dehydrogenase [LDH]: Secondary | ICD-10-CM

## 2018-02-04 DIAGNOSIS — I10 Essential (primary) hypertension: Secondary | ICD-10-CM

## 2018-02-04 DIAGNOSIS — D638 Anemia in other chronic diseases classified elsewhere: Secondary | ICD-10-CM

## 2018-02-04 DIAGNOSIS — N179 Acute kidney failure, unspecified: Secondary | ICD-10-CM

## 2018-02-04 DIAGNOSIS — R7401 Elevation of levels of liver transaminase levels: Secondary | ICD-10-CM

## 2018-02-04 LAB — GLUCOSE, CAPILLARY
GLUCOSE-CAPILLARY: 140 mg/dL — AB (ref 70–99)
GLUCOSE-CAPILLARY: 144 mg/dL — AB (ref 70–99)
GLUCOSE-CAPILLARY: 148 mg/dL — AB (ref 70–99)
GLUCOSE-CAPILLARY: 123 mg/dL — AB (ref 70–99)
Glucose-Capillary: 129 mg/dL — ABNORMAL HIGH (ref 70–99)
Glucose-Capillary: 149 mg/dL — ABNORMAL HIGH (ref 70–99)

## 2018-02-04 MED ORDER — ORAL CARE MOUTH RINSE
15.0000 mL | Freq: Two times a day (BID) | OROMUCOSAL | Status: DC
  Administered 2018-02-05 – 2018-02-23 (×36): 15 mL via OROMUCOSAL

## 2018-02-04 MED ORDER — FREE WATER
200.0000 mL | Status: DC
  Administered 2018-02-04 – 2018-02-23 (×146): 200 mL

## 2018-02-04 MED ORDER — CHLORHEXIDINE GLUCONATE 0.12 % MT SOLN
15.0000 mL | Freq: Two times a day (BID) | OROMUCOSAL | Status: DC
  Administered 2018-02-04 – 2018-02-23 (×35): 15 mL via OROMUCOSAL
  Filled 2018-02-04 (×36): qty 15

## 2018-02-04 MED ORDER — BISACODYL 10 MG RE SUPP
10.0000 mg | Freq: Every day | RECTAL | Status: DC | PRN
  Administered 2018-02-04 – 2018-02-08 (×2): 10 mg via RECTAL
  Filled 2018-02-04 (×2): qty 1

## 2018-02-04 NOTE — Progress Notes (Signed)
Speech Language Pathology Daily Session Note  Patient Details  Name: Adam Yang MRN: [id number] Date of Birth: 1945/07/14  Today's Date: 02/04/2018 SLP Individual Time: 4801-6553 SLP Individual Time Calculation (min): 41 min  Short Term Goals: Week 1: SLP Short Term Goal 1 (Week 1): Pt will utilize multimodal means to communicate basic wants and needs in 75% of opportunties with Mod A cues.  SLP Short Term Goal 2 (Week 1): Pt answer yes/no questions using multimodal communication in 5 out of 10 opportunities with Max A cues.  SLP Short Term Goal 3 (Week 1): Pt will imitate common sounds/ phrases with multimodal cues and Max A level of support.  SLP Short Term Goal 4 (Week 1): Pt will complete basic problem solving tasks related to ADLs with Min A cues.  SLP Short Term Goal 5 (Week 1): Pt will perform pharyngeal strengthening exercises with Max A cues.  SLP Short Term Goal 6 (Week 1): Given oral stim, pt will initiate timely swallow with Max A cues to perserve function and for participation in dysphagia therpay.   Skilled Therapeutic Interventions:Skilled ST services focused on cognitive skills. SLP facilitated comprehension of yes/no questions (pt demonstrated nonverbal and "no" vocalization), given questions about immediate wants/needs 50% accuracy with max A verbal/visual cues, given immediate environment/biographical infromation question pt required max A verbal/visual cues with 30% accuracy, given object identification utilizing LARK toolkit pt required Max A verbal/initial demonstration cues with 50% accuracy. Pt required max A verbal and visual cues to utilize nonverbal communication to aid in attempted to vocalize during yes/no questioning in order to aid in effective communication. Pt demonstrated the ability to vocalize "no" with 70% intelligibility, however unable to imitate other presented sounds/words. SLP facilitated object identification with HOH demonstration in a field of two, Pt  was to complete return demonstration with max A verbal/visual cues. SLP complete oral care for comfort. Pt was left in room with call bell within reach and bed alaram set. ST reccomends to continue skilled ST services.     Function:  Eating Eating                 Cognition Comprehension Comprehension assist level: Understands basic 25 - 49% of the time/ requires cueing 50 - 75% of the time;Understands basic 50 - 74% of the time/ requires cueing 25 - 49% of the time  Expression   Expression assist level: Expresses basis less than 25% of the time/requires cueing >75% of the time.  Social Interaction Social Interaction assist level: Interacts appropriately 25 - 49% of time - Needs frequent redirection.  Problem Solving Problem solving assist level: Solves basic 25 - 49% of the time - needs direction more than half the time to initiate, plan or complete simple activities  Memory Memory assist level: (unknow due to deficits)    Pain Pain Assessment Pain Scale: Faces Pain Score: 0-No pain Faces Pain Scale: Hurts a little bit Pain Intervention(s): Rest  Therapy/Group: Individual Therapy  Mellissa Conley  [facility] 02/04/2018, 10:01 AM

## 2018-02-04 NOTE — H&P (Signed)
Chief Complaint: Dysphagia  Referring Physician(s): Cathlyn Parsons  Supervising Physician: Aletta Edouard    Patient Status: [facility] - In-pt  History of Present Illness: Adam Yang is a 72 y.o. male with past medical history of hypertension, type 2 diabetes mellitus, CKD, nasopharynx cancer with radiation therapy, and remote tobacco abuse.   He presented 01/26/2018 with right-sided weakness and facial droop.  CT scan showed large hemorrhagic infarction 7.8 x 4.4 x 5.2 cm. Vasogenic edema and mass-effect with a 4 mm midline shift and effacement of the left ventricle.   CT angiogram of head and neck showed no acute or focal vascular lesion to explain the patient's hemorrhage. No definite enhancement or mass lesion.  He has significant dysphagia with a Cortraktube for nutritional support.  We are asked to place a gastrostomy tube.  He is non-verbal and his wife is at the bedside.  She tells me he had a gastrostomy tube in the past during his radiation for his throat cancer. She states she "hated the long tube" and that "it got gross and yucky looking and hung down in the way".  She is requesting a button G-tube.  Past Medical History:  Diagnosis Date  . Anxiety    Claustrophobia  . Arthritis   . Cancer (HCC)    nasopharynx  . Chronic cough    EXCESS MUCUS BUILDUP SINCE ORAL SURGERY FROM CANCER   . Chronic kidney disease    Renal Insuffiency , Creatinine has been in the 2- 3 range, 11/27/14 Creatine 1.4  . Diabetes mellitus without complication ([place])    Type II  . Gout   . History of radiation therapy 12/23/2014- 02/10/2015   Nasopharynx / Neck Adenopathy  . Hyperlipidemia   . Hypertension   . Inguinal hernia   . Right inguinal hernia 08/23/2017    Past Surgical History:  Procedure Laterality Date  . colonoscopy    . COLONOSCOPY W/ POLYPECTOMY    . INGUINAL HERNIA REPAIR Right 08/23/2017   Procedure: OPEN REPAIR RIGHT INGUINAL HERNIA WITH MESH;   Surgeon: Fanny Skates, MD;  Location: [facility];  Service: General;  Laterality: Right;  GENERAL AND TAP BLOCK  . INSERTION OF MESH Right 08/23/2017   Procedure: INSERTION OF MESH;  Surgeon: Fanny Skates, MD;  Location: [facility];  Service: General;  Laterality: Right;  GENERAL AND TAP BLOCK  . KNEE SURGERY Left 1990's   tibia crushed, pinned and bone grafted  . LYMPH NODE BIOPSY    . MULTIPLE EXTRACTIONS WITH ALVEOLOPLASTY N/A 12/06/2014   Procedure: Extraction of tooth #'s 2,4,5,6,7,8,9,10,11,12,13,14,15, 20,22,26,27,28,29 with alveoloplasty.;  Surgeon: Lenn Cal, DDS;  Location: [place];  Service: Oral Surgery;  Laterality: N/A;    Allergies: Patient has no known allergies.  Medications: Prior to Admission medications   Medication Sig Start Date End Date Taking? Authorizing Provider  Amino Acids-Protein Hydrolys (FEEDING SUPPLEMENT, PRO-STAT SUGAR FREE 64,) LIQD Place 30 mLs into feeding tube daily. 02/03/18   Donzetta Starch, NP  amLODipine (NORVASC) 10 MG tablet Take 1 tablet (10 mg total) by mouth daily. 02/03/18   Donzetta Starch, NP  artificial tears (LACRILUBE) OINT ophthalmic ointment Place into both eyes every 3 (three) hours as needed for dry eyes. 02/02/18   Donzetta Starch, NP  atorvastatin (LIPITOR) 20 MG tablet Take 1 tablet (20 mg total) by mouth daily at 6 PM. 02/02/18   Donzetta Starch, NP  chlorhexidine (PERIDEX) 0.12 % solution 15 mLs by Mouth Rinse  route 2 (two) times daily. 02/02/18   Donzetta Starch, NP  fenofibrate 160 MG tablet Take 160 mg by mouth daily.    [provider]  insulin aspart (NOVOLOG) 100 UNIT/ML injection Inject 0-15 Units into the skin every 4 (four) hours. 02/02/18   Donzetta Starch, NP  lisinopril (PRINIVIL,ZESTRIL) 20 MG tablet Take 1 tablet (20 mg total) by mouth daily. 02/02/18   Donzetta Starch, NP  mouth rinse LIQD solution 15 mLs by Mouth Rinse route every 2 (two) hours. 02/02/18   Donzetta Starch, NP  Multiple Vitamin (MULTIVITAMIN) LIQD Place  15 mLs into feeding tube daily. 02/03/18   Donzetta Starch, NP  Nutritional Supplements (FEEDING SUPPLEMENT, JEVITY 1.2 CAL,) LIQD Place 1,000 mLs into feeding tube continuous. 02/02/18   Donzetta Starch, NP  nystatin (MYCOSTATIN) 100000 UNIT/ML suspension Use as directed 5 mLs (500,000 Units total) in the mouth or throat 4 (four) times daily. 02/02/18   Donzetta Starch, NP  polyethylene glycol (MIRALAX / GLYCOLAX) packet Take 17 g by mouth daily. 02/03/18   Donzetta Starch, NP  senna-docusate (SENOKOT-S) 8.6-50 MG tablet Take 1 tablet by mouth 2 (two) times daily. 02/02/18   Donzetta Starch, NP  sodium chloride 0.9 % infusion Inject 500 mLs into the vein continuous. IV at 96mL/hr 02/02/18   Donzetta Starch, NP  tiZANidine (ZANAFLEX) 4 MG tablet Take 4 mg by mouth at bedtime. 12/12/17   [provider]     Family History  Problem Relation Age of Onset  . Cancer Mother        throat ca  . Cancer Father        lung ca  . Cancer Brother        throat ca  . Heart block Brother     Social History   Socioeconomic History  . Marital status: Married    Spouse name: Not on file  . Number of children: 1  . Years of education: Not on file  . Highest education level: Not on file  Occupational History  . Not on file  Social Needs  . Financial resource strain: Not on file  . Food insecurity:    Worry: Not on file    Inability: Not on file  . Transportation needs:    Medical: Not on file    Non-medical: Not on file  Tobacco Use  . Smoking status: Former Smoker    Packs/day: 1.00    Years: 25.00    Pack years: 25.00    Last attempt to quit: 07/12/1998    Years since quitting: 19.5  . Smokeless tobacco: Never Used  Substance and Sexual Activity  . Alcohol use: No  . Drug use: No  . Sexual activity: Not Currently  Lifestyle  . Physical activity:    Days per week: Not on file    Minutes per session: Not on file  . Stress: Not on file  Relationships  . Social connections:    Talks on  phone: Not on file    Gets together: Not on file    Attends religious service: Not on file    Active member of club or organization: Not on file    Attends meetings of clubs or organizations: Not on file    Relationship status: Not on file  Other Topics Concern  . Not on file  Social History Narrative   Patient is married and had 1 child. 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    Review of Systems  Unable to perform ROS: Patient nonverbal    Vital Signs: BP (!) 166/81 (BP Location: Left Arm)   Pulse 87   Temp 97.8 F (36.6 C)   Resp 18   Ht 5\' 9"  (1.753 m)   Wt 127 lb 10.3 oz (57.9 kg)   SpO2 99%   BMI 18.85 kg/m   Physical Exam  Constitutional: He is oriented to person, place, and time.  Thin, NAD, Panda in place left nostril  HENT:  Head: Normocephalic and atraumatic.  Neck: Normal range of motion.  Cardiovascular: Normal rate, regular rhythm and normal heart sounds.  Pulmonary/Chest: Effort normal and breath sounds normal.  Abdominal: Soft. He exhibits no distension. There is no tenderness.    Site of previous G-tube  Musculoskeletal:  Right arm and leg flaccid  Neurological: He is alert and oriented to person, place, and time.  Skin: Skin is warm and dry.  Psychiatric: He has a normal mood and affect. His behavior is normal. Judgment and thought content normal.  Vitals reviewed.   Imaging: Ct Abdomen Wo Contrast  Result Date: 02/04/2018 CLINICAL DATA:  Hemorrhagic stroke, dysphagia and malnutrition. Assessment for percutaneous gastrostomy tube placement. EXAM: CT ABDOMEN WITHOUT CONTRAST TECHNIQUE: Multidetector CT imaging of the abdomen was performed following the standard protocol without IV contrast. COMPARISON:  None. FINDINGS: Lower chest: Bibasilar densities present. Subtle pneumonia at the posterior left lung base cannot be excluded. No visible pleural effusions. Hepatobiliary:  Unenhanced appearance of the liver is unremarkable. There is some excreted contrast in the gallbladder with filling defects representing calculi. No evidence of gallbladder distention or inflammation. Pancreas: Unremarkable. No pancreatic ductal dilatation or surrounding inflammatory changes. Spleen: Normal in size without focal abnormality. Adrenals/Urinary Tract: Adrenal glands are unremarkable. Kidneys are normal, without renal calculi, focal lesion, or hydronephrosis. Stomach/Bowel: Feeding tube present extending into the second portion of the duodenum. No hiatal hernia. Gastric anatomy is normal. Bowel shows no evidence of obstruction or ileus. No free air identified. No incidental masses or ascites. Vascular/Lymphatic: No significant vascular findings are present. No enlarged abdominal or pelvic lymph nodes. Other: No hernias identified.  No evidence of focal abscess. Musculoskeletal: No acute or significant osseous findings. IMPRESSION: 1. Normal gastric anatomy with no contraindication to percutaneous gastrostomy tube placement. 2. Cholelithiasis with vicarious excretion of contrast into the gallbladder lumen. 3. Bibasilar pulmonary opacities. Subtle pneumonia at the posterior left lung base cannot be excluded. Electronically Signed   By: Aletta Edouard M.D.   On: 02/04/2018 08:10   Ct Angio Head W Or Wo Contrast  Result Date: 01/26/2018 CLINICAL DATA:  Intracranial hemorrhage. Focal neuro deficit of less than 6 hours, stroke suspected. Personal history of nasopharyngeal carcinoma. EXAM: CT ANGIOGRAPHY HEAD AND NECK TECHNIQUE: Multidetector CT imaging of the head and neck was performed using the standard protocol during bolus administration of intravenous contrast. Multiplanar CT image reconstructions and MIPs were obtained to evaluate the vascular anatomy. Carotid stenosis measurements (when applicable) are obtained utilizing NASCET criteria, using the distal internal carotid diameter as the  denominator. CONTRAST:  57mL ISOVUE-370 IOPAMIDOL (ISOVUE-370) INJECTION 76% COMPARISON:  CT head without contrast from the same day. MRI brain without and with contrast 08/15/2017. FINDINGS: CTA NECK FINDINGS Aortic arch: A 3 vessel arch configuration is present. Atherosclerotic calcifications are present at the origin of the left common carotid artery and the left subclavian artery. There is  no significant stenosis at either vessel relative to the more distal vessel. Mild narrowing of less than 50% is noted more distally within the left subclavian artery. Additional calcifications are present in the distal arch without aneurysm or stenosis. Right carotid system: The right common carotid artery is within normal limits. Atherosclerotic changes are present at the right carotid bifurcation and proximal 2.5 cm of the right internal carotid artery. The cervical right ICA is otherwise normal. Left carotid system: The left common carotid artery is within normal limits beyond the origin. Atherosclerotic changes are noted at the left carotid bifurcation and over the proximal 2.5 cm of the left ICA without a significant stenosis relative to the more distal vessel. The cervical left ICA is otherwise normal. Vertebral arteries: The vertebral arteries originate from the subclavian arteries bilaterally. There is mild narrowing of less than 50% at the origins of both vessels. The right vertebral artery is the dominant vessel. No additional stenosis is present in the neck of either vertebral artery. Skeleton: Degenerative endplate changes are present throughout cervical spine. There is fusion across the endplates at P8-2. Uncovertebral spurring contributes to osseous foraminal narrowing on the right at C3-4, C4-5, and C5-6 and on the left most notably at C4-5 and C5-6. Patient is edentulous. No focal lytic or blastic lesions are present. Other neck: Asymmetric fatty atrophy in the left side of the tongue is likely related to  treatment. No significant nasopharyngeal mass or adenopathy is present. The thyroid is mildly heterogeneous without a dominant lesion. Upper chest: Centrilobular emphysematous changes are present in the lungs bilaterally. No focal nodule, mass, or airspace disease is present otherwise. The thoracic inlet is otherwise within normal limits. Review of the MIP images confirms the above findings CTA HEAD FINDINGS Anterior circulation: Mild atherosclerotic changes are present within the cavernous and precavernous internal carotid arteries without significant stenosis through the ICA termini. The A1 and M1 segments are within normal limits. The anterior communicating artery is patent. MCA bifurcations are intact bilaterally. No significant proximal stenosis or occlusion is present. There is no focal aneurysm. No vascular from malformation is present. Left-sided MCA branches are displaced secondary to the hemorrhage. Posterior circulation: The right vertebral artery is the dominant vessel. PICA origins are visualized and normal. Basilar artery is within normal limits. Both posterior cerebral arteries originate from the basilar tip. PCA branch vessels are within normal limits bilaterally. Venous sinuses: The dural sinuses are patent. The straight sinus and deep cerebral veins are intact. Cortical veins are unremarkable. Anatomic variants: None Delayed phase: A comparison with the noncontrast head CT, there is no definite enhancement. No discrete mass lesion is present. Review of the MIP images confirms the above findings IMPRESSION: 1. No acute or focal vascular lesion to explain the patient's hemorrhage. 2. No definite enhancement or mass lesion. 3. Atherosclerotic changes involving the aortic arch and bilateral carotid bifurcations without a significant stenosis relative to the more distal vessels. 4. Cavernous atherosclerotic changes are much more mild, also without a significant stenosis. 5. Left MCA branch vessels are  displaced by the hemorrhagic mass. Electronically Signed   By: San Morelle M.D.   On: 01/26/2018 10:34   Ct Head Wo Contrast  Result Date: 02/03/2018 CLINICAL DATA:  Follow-up examination for intracranial hemorrhage. Found sitting on floor. EXAM: CT HEAD WITHOUT CONTRAST TECHNIQUE: Contiguous axial images were obtained from the base of the skull through the vertex without intravenous contrast. COMPARISON:  Prior CT from 01/28/2018 FINDINGS: Brain: Previously identified large intraparenchymal  hemorrhage centered at the left frontal white matter overall similar in size and morphology, although is slightly decreased in density as compared to previous exam, compatible with evolving hematoma. Associated regional mass effect with vasogenic edema is slightly worsened, with slightly worsened 6 mm of left-to-right shift. Small volume degenerating blood products present within the occipital horns of both lateral ventricles. Ventricular size relatively stable without hydrocephalus or ventricular trapping. Basilar cisterns remain patent. No new intracranial hemorrhage. No other acute intracranial abnormality. No other acute large vessel territory infarct. No extra-axial fluid collection. No mass lesion. Vascular: No hyperdense vessel. Scattered vascular calcifications noted within the carotid siphons. Skull: Scalp soft tissues and calvarium demonstrate no acute abnormality. Sinuses/Orbits: Globes and orbital soft tissues demonstrate no acute finding chronic mucosal thickening within the right maxillary sinus. Nasogastric tube in place. Trace opacity right mastoid air cells. Other: None. IMPRESSION: 1. Continued interval evolution of large intraparenchymal hemorrhage centered at the left frontal lobe, similar in size and morphology from previous. Mildly increased vasogenic edema with regional mass effect, with slightly worsened 6 mm right-to-left midline shift. 2. Small volume intraventricular hemorrhage, similar to  previous. No hydrocephalus or ventricular trapping. 3. No other new acute intracranial abnormality. Electronically Signed   By: Jeannine Boga M.D.   On: 02/03/2018 21:32   Ct Head Wo Contrast  Result Date: 01/28/2018 CLINICAL DATA:  Follow-up examination for intracranial hemorrhage. EXAM: CT HEAD WITHOUT CONTRAST TECHNIQUE: Contiguous axial images were obtained from the base of the skull through the vertex without intravenous contrast. COMPARISON:  Prior CT from 01/26/2018. FINDINGS: Brain: Intraparenchymal hemorrhage centered at the left frontal white matter relatively unchanged in size and distribution. Similar surrounding low-density vasogenic edema with regional mass effect. Persistent mass effect on the adjacent left lateral ventricle with persistent 4 mm left-to-right shift. Interval development of small volume intraventricular hemorrhage with blood seen layering within the occipital horns of both lateral ventricles. No hydrocephalus or ventricular trapping at this time. Basilar cisterns remain patent. No other new acute intracranial hemorrhage. No other acute large vessel territory infarct. No other acute large vessel territory infarct. No extra-axial fluid collection. Vascular: No hyperdense vessel. Skull: Scalp soft tissues and calvarium demonstrate no acute finding. Sinuses/Orbits: Globes and orbital soft tissues within normal limits. Mild right maxillary sinus mucosal thickening. Paranasal sinuses are otherwise clear. No mastoid effusion. Other: None. IMPRESSION: 1. No significant interval change in size and distribution of large intraparenchymal hemorrhage centered at the left frontal white matter. Similar localized regional mass effect with persistent 4 mm left-to-right shift. 2. Interval development of small volume intraventricular hemorrhage. No hydrocephalus or ventricular trapping. 3. No other new acute intracranial abnormality. Electronically Signed   By: Jeannine Boga M.D.   On:  01/28/2018 05:11   Ct Head Wo Contrast  Result Date: 01/26/2018 CLINICAL DATA:  Intracranial hemorrhage follow EXAM: CT HEAD WITHOUT CONTRAST TECHNIQUE: Contiguous axial images were obtained from the base of the skull through the vertex without intravenous contrast. COMPARISON:  Head CT 01/26/2018 at 9:23 a.m. FINDINGS: Brain: The intraparenchymal hematoma centered in the left frontal white matter is unchanged in size. Mild mass effect on the left lateral ventricle with 4 mm of rightward midline shift is unchanged. No new site of hemorrhage. No hydrocephalus. Vascular: No abnormal hyperdensity of the major intracranial arteries or dural venous sinuses. No intracranial atherosclerosis. Skull: The visualized skull base, calvarium and extracranial soft tissues are normal. Sinuses/Orbits: No fluid levels or advanced mucosal thickening of the visualized paranasal sinuses. No  mastoid or middle ear effusion. The orbits are normal. IMPRESSION: Unchanged size of large intraparenchymal hemorrhage centered in the left frontal white matter. Unchanged 4 mm rightward midline shift. Electronically Signed   By: Ulyses Jarred M.D.   On: 01/26/2018 16:26   Ct Angio Neck W Or Wo Contrast  Result Date: 01/26/2018 CLINICAL DATA:  Intracranial hemorrhage. Focal neuro deficit of less than 6 hours, stroke suspected. Personal history of nasopharyngeal carcinoma. EXAM: CT ANGIOGRAPHY HEAD AND NECK TECHNIQUE: Multidetector CT imaging of the head and neck was performed using the standard protocol during bolus administration of intravenous contrast. Multiplanar CT image reconstructions and MIPs were obtained to evaluate the vascular anatomy. Carotid stenosis measurements (when applicable) are obtained utilizing NASCET criteria, using the distal internal carotid diameter as the denominator. CONTRAST:  67mL ISOVUE-370 IOPAMIDOL (ISOVUE-370) INJECTION 76% COMPARISON:  CT head without contrast from the same day. MRI brain without and with  contrast 08/15/2017. FINDINGS: CTA NECK FINDINGS Aortic arch: A 3 vessel arch configuration is present. Atherosclerotic calcifications are present at the origin of the left common carotid artery and the left subclavian artery. There is no significant stenosis at either vessel relative to the more distal vessel. Mild narrowing of less than 50% is noted more distally within the left subclavian artery. Additional calcifications are present in the distal arch without aneurysm or stenosis. Right carotid system: The right common carotid artery is within normal limits. Atherosclerotic changes are present at the right carotid bifurcation and proximal 2.5 cm of the right internal carotid artery. The cervical right ICA is otherwise normal. Left carotid system: The left common carotid artery is within normal limits beyond the origin. Atherosclerotic changes are noted at the left carotid bifurcation and over the proximal 2.5 cm of the left ICA without a significant stenosis relative to the more distal vessel. The cervical left ICA is otherwise normal. Vertebral arteries: The vertebral arteries originate from the subclavian arteries bilaterally. There is mild narrowing of less than 50% at the origins of both vessels. The right vertebral artery is the dominant vessel. No additional stenosis is present in the neck of either vertebral artery. Skeleton: Degenerative endplate changes are present throughout cervical spine. There is fusion across the endplates at Z5-6. Uncovertebral spurring contributes to osseous foraminal narrowing on the right at C3-4, C4-5, and C5-6 and on the left most notably at C4-5 and C5-6. Patient is edentulous. No focal lytic or blastic lesions are present. Other neck: Asymmetric fatty atrophy in the left side of the tongue is likely related to treatment. No significant nasopharyngeal mass or adenopathy is present. The thyroid is mildly heterogeneous without a dominant lesion. Upper chest: Centrilobular  emphysematous changes are present in the lungs bilaterally. No focal nodule, mass, or airspace disease is present otherwise. The thoracic inlet is otherwise within normal limits. Review of the MIP images confirms the above findings CTA HEAD FINDINGS Anterior circulation: Mild atherosclerotic changes are present within the cavernous and precavernous internal carotid arteries without significant stenosis through the ICA termini. The A1 and M1 segments are within normal limits. The anterior communicating artery is patent. MCA bifurcations are intact bilaterally. No significant proximal stenosis or occlusion is present. There is no focal aneurysm. No vascular from malformation is present. Left-sided MCA branches are displaced secondary to the hemorrhage. Posterior circulation: The right vertebral artery is the dominant vessel. PICA origins are visualized and normal. Basilar artery is within normal limits. Both posterior cerebral arteries originate from the basilar tip. PCA branch vessels  are within normal limits bilaterally. Venous sinuses: The dural sinuses are patent. The straight sinus and deep cerebral veins are intact. Cortical veins are unremarkable. Anatomic variants: None Delayed phase: A comparison with the noncontrast head CT, there is no definite enhancement. No discrete mass lesion is present. Review of the MIP images confirms the above findings IMPRESSION: 1. No acute or focal vascular lesion to explain the patient's hemorrhage. 2. No definite enhancement or mass lesion. 3. Atherosclerotic changes involving the aortic arch and bilateral carotid bifurcations without a significant stenosis relative to the more distal vessels. 4. Cavernous atherosclerotic changes are much more mild, also without a significant stenosis. 5. Left MCA branch vessels are displaced by the hemorrhagic mass. Electronically Signed   By: San Morelle M.D.   On: 01/26/2018 10:34   Ct Head Code Stroke Wo Contrast  Result Date:  01/26/2018 CLINICAL DATA:  Code stroke. Acute onset of right-sided weakness and aphasia. Last seen well 1 hour ago. Focal neuro deficit, less than 6 hours, stroke suspected. EXAM: CT HEAD WITHOUT CONTRAST TECHNIQUE: Contiguous axial images were obtained from the base of the skull through the vertex without intravenous contrast. COMPARISON:  None. FINDINGS: Brain: A parenchymal hemorrhage in the anterior left frontal lobe measures 7.8 x 4.4 x 5.2 cm. There is surrounding vasogenic edema. This results in significant effacement of the adjacent sulci. Midline shift is 4 mm. There is effacement of the left lateral ventricle. No intraventricular hemorrhage is present. Mild white matter changes are noted bilaterally. No other cortical infarct is present. Basal ganglia are intact. The brainstem and cerebellum are normal. Vascular: Atherosclerotic calcifications are present within the cavernous internal carotid arteries bilaterally. There is no hyperdense vessel. Skull: Calvarium is intact. No focal lytic or blastic lesions are present. No significant extracranial soft tissue lesion is present. Sinuses/Orbits: Mild mucosal thickening is present posteriorly in the right maxillary sinus. Some wall thickening of the maxillary sinuses bilaterally suggest chronic disease. No acute fluid levels are present. The remaining paranasal sinuses in the mastoid air cells are clear. Globes and orbits are within normal limits. IMPRESSION: 1. 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 2. Screening vasogenic edema and mass effect with 4 mm of midline shift and effacement of the left lateral ventricle. 3. Mild generalized white matter disease likely reflects the sequela of chronic microvascular ischemia The above was relayed via text pager to Dr. Rory Percy on 01/26/2018 at 09:28 . Electronically Signed   By: San Morelle M.D.   On: 01/26/2018 09:37    Labs:  CBC: Recent Labs    01/29/18 0338  01/30/18 0251 01/31/18 0401 02/03/18 0551  WBC 10.6* 8.5 8.2 8.8  HGB 11.0* 13.5 12.6* 12.6*  HCT 34.3* 42.4 40.7 42.5  PLT 338 179 301 276    COAGS: Recent Labs    01/26/18 0913  INR 0.95  APTT 32    BMP: Recent Labs    01/30/18 0251 01/31/18 0401 02/02/18 0436 02/03/18 0551  NA 147* 148* 153* 157*  K 4.3 4.2 4.3 3.9  CL 111 113* 119* 119*  CO2 25 26 25 27   GLUCOSE 145* 176* 190* 141*  BUN 38* 49* 52* 58*  CALCIUM 9.6 9.5 9.1 9.3  CREATININE 1.61* 1.88* 1.98* 2.21*  GFRNONAA 42* 35* 32* 28*  GFRAA 48* 40* 38* 33*    LIVER FUNCTION TESTS: Recent Labs    02/18/17 1502 08/19/17 1336 01/26/18 0913 02/03/18 0551  BILITOT 0.25 0.1* 0.4  0.5  AST 16 20 21  40  ALT 12 14* 15 139*  ALKPHOS 58 32* 40 52  PROT 7.0 7.7 7.9 6.9  ALBUMIN 3.0* 3.8 3.7 3.2*    TUMOR MARKERS: No results for input(s): AFPTM, CEA, CA199, CHROMGRNA in the last 8760 hours.  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  Will proceed with gastrostomy tube placement next Week.  Will try to obtain the Button type Gastrostomy tube at the request of the wife.  Risks and benefits discussed with the patient including, but not limited to the need for a barium enema during the procedure, bleeding, infection, peritonitis, or damage to adjacent structures.  All of the patient's questions were answered, patient is agreeable to proceed. Consent signed and in chart.  Thank you for this interesting consult.  I greatly enjoyed meeting Lucien Budney and look forward to participating in their care.  A copy of this report was sent to the requesting provider on this date.  Electronically Signed: Murrell Redden, PA-C   02/04/2018, 12:11 PM      I spent a total of 40 Minutes in face to face in clinical consultation, greater than 50% of which was counseling/coordinating care for G-tube placement.

## 2018-02-04 NOTE — Progress Notes (Signed)
Occupational Therapy Session Note  Patient Details  Name: Adam Yang MRN: [id number] Date of Birth: 1945/08/25  Today's Date: 02/04/2018 OT Individual Time: 1300-1413 OT Individual Time Calculation (min): 73 min    Short Term Goals: Week 1:  OT Short Term Goal 1 (Week 1): Pt will complete sit > stand with mod assist to decrease burden of care with LB bathing/dressing OT Short Term Goal 2 (Week 1): Pt will complete bathing with max assist with mod cues for sequencing OT Short Term Goal 3 (Week 1): Pt will complete UB dressing with max assist OT Short Term Goal 4 (Week 1): Pt will complete toilet transfer with max assist of 1 caregiver  Skilled Therapeutic Interventions/Progress Updates:    1:1. Pt lethargic throughout session with visitors/wife present at beginning of session. Educated visitors/wife to be mostly on R side to facilitate R attention/gaze. Pt supine>sitting EOB with MOD A for LE/trunk management. Pt completes squat pivot transfer EOB<>TIS with MAX A of 1 with R knee block and multimodal cueing to keep hand in lap to decrease pushing. Pt bathes UB at sink with significantly increased time to reach/locate items on R/initate reaching/bathing. Pt requires mod-max HOH A to initate bathing body parts and turning head to R to locate items. Pt dons pull over shirt with overall MAX A for threading RLE, head and pulling shirt down R side. Exited session with pt seated bed, HOB elevated, and modualr hose tablet holder mounted to R bed rail to increase R attention. Pt able to scan laterally to R to look at movie playing on screen but does not turn head. Exit alarm on and call light in reach  Therapy Documentation Precautions:  Precautions Precautions: Fall Precaution Comments: R hemi, R inattention Restrictions Weight Bearing Restrictions: No General:    See Function Navigator for Current Functional Status.   Therapy/Group: Individual Therapy  [name] 02/04/2018,  2:13 PM

## 2018-02-04 NOTE — Plan of Care (Signed)
  Problem: Consults Goal: RH STROKE PATIENT EDUCATION Description See Patient Education module for education specifics  Outcome: Progressing Goal: Nutrition Consult-if indicated Outcome: Progressing Goal: Diabetes Guidelines if Diabetic/Glucose > 140 Description If diabetic or lab glucose is > 140 mg/dl - Initiate Diabetes/Hyperglycemia Guidelines & Document Interventions  Outcome: Progressing   Problem: RH SKIN INTEGRITY Goal: RH STG SKIN FREE OF INFECTION/BREAKDOWN Outcome: Progressing Goal: RH STG MAINTAIN SKIN INTEGRITY WITH ASSISTANCE Description STG Maintain Skin Integrity With min Assistance.  Outcome: Progressing   Problem: RH SAFETY Goal: RH STG ADHERE TO SAFETY PRECAUTIONS W/ASSISTANCE/DEVICE Description STG Adhere to Safety Precautions With min Assistance/Device.  Outcome: Progressing Goal: RH STG DECREASED RISK OF FALL WITH ASSISTANCE Description STG Decreased Risk of Fall With min Assistance.  Outcome: Progressing Goal: RH STG DEMO UNDERSTANDING HOME SAFETY PRECAUTIONS Outcome: Progressing   Problem: RH COGNITION-NURSING Goal: RH STG USES MEMORY AIDS/STRATEGIES W/ASSIST TO PROBLEM SOLVE Description STG Uses Memory Aids/Strategies With Assistance to Problem Solve. Outcome: Progressing Goal: RH STG ANTICIPATES NEEDS/CALLS FOR ASSIST W/ASSIST/CUES Description STG Anticipates Needs/Calls for Assist With min Assistance/Cues.  Outcome: Progressing   Problem: RH PAIN MANAGEMENT Goal: RH STG PAIN MANAGED AT OR BELOW PT'S PAIN GOAL Description Less than 3 out of 10   Outcome: Progressing   Problem: RH KNOWLEDGE DEFICIT Goal: RH STG INCREASE KNOWLEDGE OF DIABETES Outcome: Progressing Goal: RH STG INCREASE KNOWLEDGE OF HYPERTENSION Outcome: Progressing Goal: RH STG INCREASE KNOWLEDGE OF DYSPHAGIA/FLUID INTAKE Outcome: Progressing   Problem: RH Vision Goal: RH LTG Vision (Specify) Outcome: Progressing   Problem: RH BOWEL ELIMINATION Goal: RH STG MANAGE  BOWEL WITH ASSISTANCE Description STG Manage Bowel with min Assistance.  Outcome: Not Progressing Goal: RH STG MANAGE BOWEL W/MEDICATION W/ASSISTANCE Description STG Manage Bowel with Medication with min Assistance.  Outcome: Not Progressing   Problem: RH BLADDER ELIMINATION Goal: RH STG MANAGE BLADDER WITH ASSISTANCE Description STG Manage Bladder With min Assistance  Outcome: Not Progressing Goal: RH STG MANAGE BLADDER WITH MEDICATION WITH ASSISTANCE Description STG Manage Bladder With Medication With min Assistance.  Outcome: Not Progressing

## 2018-02-04 NOTE — Progress Notes (Signed)
Physical Therapy Session Note  Patient Details  Name: Adam Yang MRN: [id number] Date of Birth: [birth year]  Today's Date: 02/04/2018 PT Individual Time: 7829-5621 PT Individual Time Calculation (min): 70 min   Short Term Goals: Week 1:  PT Short Term Goal 1 (Week 1): Pt will complete bed<>w/c with mod assist +1. PT Short Term Goal 2 (Week 1): Pt will propel w/c 25 ft with min assist. PT Short Term Goal 3 (Week 1): Pt will complete bed mobility with mod assist.  Skilled Therapeutic Interventions/Progress Updates:  Pt received in bed. Pt with groans throughout session & RN made aware - no other behaviors demonstrating pain though. Pt performed rolling in bed with therapist attempting to teach him compensatory techniques but pt with decreased ability to execute them even with max multimodal cuing and therapist positioning LE. Pt requires max assist rolling and max assist for supine>sitting with pt demonstrating slight improved ability to assist with transferring out of R side of bed. Therapist provides max assist for donning pants & shirt bed level; pt able to assist with pulling pants over L hip and pulling shirt over head. When sitting EOB pt demonstrates significant push to R with LUE despite max cuing to place LUE in lap to prevent pushing. Pt transferred bed>w/c via stedy lift with +2 assist 2/2 pushing R and R lateral lean while seated in stedy but pt was able to participate in sit>stand portion of transfer. Transported pt throughout unit via w/c total assist. In BI gym, engaged pt in dynavision from w/c level with LUE with task focusing on sustained attention, anterior weight shifting, & visual scanning R of midline & R attention. Pt requires total assist and hand over hand to complete task with LUE. Pt with impaired sustained attention even when completing task in L quadrant only, and attempting to press lights that weren't activated. Pt requires total assist to locate & push lights and to  attend to R visual field. Pt's BP assessed 2/2 pt continuing to demonstrate behaviors of not feeling well but also providing inconsistent yes/no head shakes responses. BP = 93/82 mmHg (LUE, sitting), HR = 87 bpm, SpO2 = 99% on room air. Rechecked in LUE: BP = 100/79 mmHg, HR = 88 bpm RN made aware & cleared pt for continuation in therapy. In gym attempted to utilized music therapy to increase pt's alertness & attention while having him reach for objects to focus on following one step commands, looking R of midline, and engaging in functional task. Pt requires significantly extra time to only obtain 2 objects. Returned pt to room & assisted pt to bed via total assist +2 squat pivot transfer and sit>supine with +2 assist. Pt left in bed with alarm set & all needs in reach.  At end of session supine in bed: BP = 130/84 mmHg (RUE).  Therapy Documentation Precautions:  Precautions Precautions: Fall Precaution Comments: R hemi, R inattention Restrictions Weight Bearing Restrictions: No   See Function Navigator for Current Functional Status.   Therapy/Group: Individual Therapy  Waunita Schooner 02/04/2018, 12:18 PM

## 2018-02-04 NOTE — Progress Notes (Addendum)
Subjective/Complaints: Patient seen sitting up in bed this morning. No reported issues overnight. He is nonverbal. Overnight, patient had an unwitnessed fall.  Review of systems: Unable to obtain due to aphasia  Objective: Vital Signs: Blood pressure (!) 166/81, pulse 87, temperature 97.8 F (36.6 C), resp. rate 18, height _0  (1.753 m), weight 57.9 kg (127 lb 10.3 oz), SpO2 99 %. Ct Abdomen Wo Contrast  Result Date: 02/04/2018 CLINICAL DATA:  Hemorrhagic stroke, dysphagia and malnutrition. Assessment for percutaneous gastrostomy tube placement. EXAM: CT ABDOMEN WITHOUT CONTRAST TECHNIQUE: Multidetector CT imaging of the abdomen was performed following the standard protocol without IV contrast. COMPARISON:  None. FINDINGS: Lower chest: Bibasilar densities present. Subtle pneumonia at the posterior left lung base cannot be excluded. No visible pleural effusions. Hepatobiliary: Unenhanced appearance of the liver is unremarkable. There is some excreted contrast in the gallbladder with filling defects representing calculi. No evidence of gallbladder distention or inflammation. Pancreas: Unremarkable. No pancreatic ductal dilatation or surrounding inflammatory changes. Spleen: Normal in size without focal abnormality. Adrenals/Urinary Tract: Adrenal glands are unremarkable. Kidneys are normal, without renal calculi, focal lesion, or hydronephrosis. Stomach/Bowel: Feeding tube present extending into the second portion of the duodenum. No hiatal hernia. Gastric anatomy is normal. Bowel shows no evidence of obstruction or ileus. No free air identified. No incidental masses or ascites. Vascular/Lymphatic: No significant vascular findings are present. No enlarged abdominal or pelvic lymph nodes. Other: No hernias identified.  No evidence of focal abscess. Musculoskeletal: No acute or significant osseous findings. IMPRESSION: 1. Normal gastric anatomy with no contraindication to percutaneous gastrostomy tube  placement. 2. Cholelithiasis with vicarious excretion of contrast into the gallbladder lumen. 3. Bibasilar pulmonary opacities. Subtle pneumonia at the posterior left lung base cannot be excluded. Electronically Signed   By: Aletta Edouard M.D.   On: 02/04/2018 08:10   Ct Head Wo Contrast  Result Date: 02/03/2018 CLINICAL DATA:  Follow-up examination for intracranial hemorrhage. Found sitting on floor. EXAM: CT HEAD WITHOUT CONTRAST TECHNIQUE: Contiguous axial images were obtained from the base of the skull through the vertex without intravenous contrast. COMPARISON:  Prior CT from 01/28/2018 FINDINGS: Brain: Previously identified large intraparenchymal hemorrhage centered at the left frontal white matter overall similar in size and morphology, although is slightly decreased in density as compared to previous exam, compatible with evolving hematoma. Associated regional mass effect with vasogenic edema is slightly worsened, with slightly worsened 6 mm of left-to-right shift. Small volume degenerating blood products present within the occipital horns of both lateral ventricles. Ventricular size relatively stable without hydrocephalus or ventricular trapping. Basilar cisterns remain patent. No new intracranial hemorrhage. No other acute intracranial abnormality. No other acute large vessel territory infarct. No extra-axial fluid collection. No mass lesion. Vascular: No hyperdense vessel. Scattered vascular calcifications noted within the carotid siphons. Skull: Scalp soft tissues and calvarium demonstrate no acute abnormality. Sinuses/Orbits: Globes and orbital soft tissues demonstrate no acute finding chronic mucosal thickening within the right maxillary sinus. Nasogastric tube in place. Trace opacity right mastoid air cells. Other: None. IMPRESSION: 1. Continued interval evolution of large intraparenchymal hemorrhage centered at the left frontal lobe, similar in size and morphology from previous. Mildly  increased vasogenic edema with regional mass effect, with slightly worsened 6 mm right-to-left midline shift. 2. Small volume intraventricular hemorrhage, similar to previous. No hydrocephalus or ventricular trapping. 3. No other new acute intracranial abnormality. Electronically Signed   By: Jeannine Boga M.D.   On: 02/03/2018 21:32   Results for orders placed or  performed during the hospital encounter of 02/02/18 (from the past 72 hour(s))  Glucose, capillary     Status: Abnormal   Collection Time: 02/02/18  8:49 PM  Result Value Ref Range   Glucose-Capillary 104 (H) 70 - 99 mg/dL  Glucose, capillary     Status: Abnormal   Collection Time: 02/03/18  2:06 AM  Result Value Ref Range   Glucose-Capillary 164 (H) 70 - 99 mg/dL  Glucose, capillary     Status: Abnormal   Collection Time: 02/03/18  4:08 AM  Result Value Ref Range   Glucose-Capillary 172 (H) 70 - 99 mg/dL  CBC WITH DIFFERENTIAL     Status: Abnormal   Collection Time: 02/03/18  5:51 AM  Result Value Ref Range   WBC 8.8 4.0 - 10.5 K/uL   RBC 4.88 4.22 - 5.81 MIL/uL   Hemoglobin 12.6 (L) 13.0 - 17.0 g/dL   HCT 42.5 39.0 - 52.0 %   MCV 87.1 78.0 - 100.0 fL   MCH 25.8 (L) 26.0 - 34.0 pg   MCHC 29.6 (L) 30.0 - 36.0 g/dL   RDW 15.7 (H) 11.5 - 15.5 %   Platelets 276 150 - 400 K/uL   Neutrophils Relative % 71 %   Neutro Abs 6.3 1.7 - 7.7 K/uL   Lymphocytes Relative 12 %   Lymphs Abs 1.0 0.7 - 4.0 K/uL   Monocytes Relative 9 %   Monocytes Absolute 0.8 0.1 - 1.0 K/uL   Eosinophils Relative 6 %   Eosinophils Absolute 0.6 0.0 - 0.7 K/uL   Basophils Relative 1 %   Basophils Absolute 0.1 0.0 - 0.1 K/uL   Immature Granulocytes 1 %   Abs Immature Granulocytes 0.1 0.0 - 0.1 K/uL    Comment: Performed at [facility], [address], [place], [place] [address]  Comprehensive metabolic panel     Status: Abnormal   Collection Time: 02/03/18  5:51 AM  Result Value Ref Range   Sodium 157 (H) 135 - 145 mmol/L   Potassium  3.9 3.5 - 5.1 mmol/L   Chloride 119 (H) 98 - 111 mmol/L   CO2 27 22 - 32 mmol/L   Glucose, Bld 141 (H) 70 - 99 mg/dL   BUN 58 (H) 8 - 23 mg/dL   Creatinine, Ser 2.21 (H) 0.61 - 1.24 mg/dL   Calcium 9.3 8.9 - 10.3 mg/dL   Total Protein 6.9 6.5 - 8.1 g/dL   Albumin 3.2 (L) 3.5 - 5.0 g/dL   AST 40 15 - 41 U/L   ALT 139 (H) 0 - 44 U/L   Alkaline Phosphatase 52 38 - 126 U/L   Total Bilirubin 0.5 0.3 - 1.2 mg/dL   GFR calc non Af Amer 28 (L) >60 mL/min   GFR calc Af Amer 33 (L) >60 mL/min    Comment: (NOTE) The eGFR has been calculated using the CKD EPI equation. This calculation has not been validated in all clinical situations. eGFR's persistently <60 mL/min signify possible Chronic Kidney Disease.    Anion gap 11 5 - 15    Comment: Performed at [facility] [address] [place], [place] [address]  Glucose, capillary     Status: None   Collection Time: 02/03/18  8:00 AM  Result Value Ref Range   Glucose-Capillary 98 70 - 99 mg/dL   Comment 1 Notify RN   Glucose, capillary     Status: Abnormal   Collection Time: 02/03/18 11:47 AM  Result Value Ref Range  Glucose-Capillary 153 (H) 70 - 99 mg/dL   Comment 1 Notify RN   Glucose, capillary     Status: Abnormal   Collection Time: 02/03/18  5:05 PM  Result Value Ref Range   Glucose-Capillary 176 (H) 70 - 99 mg/dL   Comment 1 Notify RN   Glucose, capillary     Status: None   Collection Time: 02/03/18  8:08 PM  Result Value Ref Range   Glucose-Capillary 95 70 - 99 mg/dL  Glucose, capillary     Status: Abnormal   Collection Time: 02/04/18 12:20 AM  Result Value Ref Range   Glucose-Capillary 149 (H) 70 - 99 mg/dL   Comment 1 Notify RN   Glucose, capillary     Status: Abnormal   Collection Time: 02/04/18  4:19 AM  Result Value Ref Range   Glucose-Capillary 140 (H) 70 - 99 mg/dL   Comment 1 Notify RN   Glucose, capillary     Status: Abnormal   Collection Time: 02/04/18  8:51 AM  Result Value Ref Range    Glucose-Capillary 129 (H) 70 - 99 mg/dL   Comment 1 Notify RN   Glucose, capillary     Status: Abnormal   Collection Time: 02/04/18 11:57 AM  Result Value Ref Range   Glucose-Capillary 144 (H) 70 - 99 mg/dL   Comment 1 Notify RN      HENT: normocephalic. Atraumatic. Poor dentition Eyes: EOMI. No discharge. Cardio: RRR. No JVD. Resp: CTA B/L and Unlabored GI: BS positive and nondistended. +NG. Skin:   Intact. Warm and dry. Neuro: Alert Motor: limited due to aphasia, but appears to be 0/5 in the right upper extremity, 0/5 right lower extremity Musc/Skel:  No edema or tenderness in extremities General no acute distress. Vital signs reviewed.   Assessment/Plan: 1. 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 Physiatrist is providing close team supervision and 24 hour management of active medical problems listed below. Physiatrist and rehab team continue to assess barriers to discharge/monitor patient progress toward functional and medical goals. FIM: Function - Bathing Position: Wheelchair/chair at sink Body parts bathed by patient: Chest, Abdomen Body parts bathed by helper: Right arm, Left arm, Front perineal area, Buttocks, Right upper leg, Left upper leg, Right lower leg, Left lower leg, Back Assist Level: 2 helpers  Function- Upper Body Dressing/Undressing What is the patient wearing?: Button up shirt Button up shirt - Perfomed by helper: Thread/unthread right sleeve, Thread/unthread left sleeve, Button/unbutton shirt, Pull shirt around back Assist Level: (Total assist) Function - Lower Body Dressing/Undressing What is the patient wearing?: Pants, Non-skid slipper socks Position: Wheelchair/chair at sink Pants- Performed by helper: Thread/unthread right pants leg, Thread/unthread  left pants leg, Pull pants up/down Non-skid slipper socks- Performed by helper: Don/doff right sock, Don/doff left sock Assist for footwear: Dependant Assist for lower body dressing: 2 Helpers  Function - Toileting Toileting activity did not occur: No continent bowel/bladder event     Function - Chair/bed transfer Chair/bed transfer method: Stand pivot Chair/bed transfer assist level: 2 helpers Chair/bed transfer assistive device: Mechanical lift Mechanical lift: Stedy Chair/bed transfer details: Tactile cues for initiation, Tactile cues for weight shifting, Tactile cues for posture, Tactile cues for sequencing, Tactile cues for placement, Verbal cues for technique, Verbal cues for sequencing, Manual facilitation for weight shifting, Manual facilitation for placement  Function - Locomotion: Wheelchair Will patient  use wheelchair at discharge?: Yes Wheelchair activity did not occur: Safety/medical concerns Wheel 50 feet with 2 turns activity did not occur: Safety/medical concerns Wheel 150 feet activity did not occur: Safety/medical concerns Turns around,maneuvers to table,bed, and toilet,negotiates 3% grade,maneuvers on rugs and over doorsills: No Function - Locomotion: Ambulation Assistive device: (3 muskateers + w/c follow) Max distance: 3 ft Assist level: 2 helpers Walk 10 feet activity did not occur: Safety/medical concerns Walk 50 feet with 2 turns activity did not occur: Safety/medical concerns Walk 150 feet activity did not occur: Safety/medical concerns Walk 10 feet on uneven surfaces activity did not occur: Safety/medical concerns  Function - Comprehension Comprehension: Auditory Comprehension assist level: Understands basic 25 - 49% of the time/ requires cueing 50 - 75% of the time, Understands basic 50 - 74% of the time/ requires cueing 25 - 49% of the time  Function - Expression Expression: Nonverbal, Verbal Expression assist level: Expresses basis less than 25% of  the time/requires cueing >75% of the time.  Function - Social Interaction Social Interaction assist level: Interacts appropriately 25 - 49% of time - Needs frequent redirection.  Function - Problem Solving Problem solving assist level: Solves basic 25 - 49% of the time - needs direction more than half the time to initiate, plan or complete simple activities  Function - Memory Memory assist level: (unknow due to deficits) Patient normally able to recall (first 3 days only): None of the above  Medical Problem List and Plan: 1.Right side weakness with facial droop and dysphasiasecondary to large left frontoparietal hemorrhagic infarction with vasogenic edema and mass-effect  Continue CIR  Notes reviewed-resented with stroke on 7/18, images reviewed-large left hemorrhagic infarction, repeat CT also reviewed after fall showing evolution, labs reviewed 2. DVT Prophylaxis/Anticoagulation: SCDs. Monitor for any signs of DVT 3. Pain Management:Zanaflex 4 mg nightly 4. Mood:Provide emotional support 5. Neuropsych: This patientis notcapable of making decisions on hisown behalf. 6. Skin/Wound Care:Routine skin checks 7. Fluids/Electrolytes/Nutrition:Routine in and outs with follow-up chemistries 8.Dysphagia: NPO/Cortrakin place.  Prognosis for recovery of swallowing is poor.  Recommend PEG.  Likely will be a prolonged recovery.  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   Lisinopril 20 mg daily, Norvasc 10 mg daily. Monitor with increased mobility  Labile on 7/27, monitor for trend 10.Diabetes mellitus. Hemoglobin A1c 6.0. SSI. 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 12.Hyperlipidemia. Fenofibrate. 13.  Hypernatremia  Sodium 157 on 7/26  Free water flushes increased  Labs ordered for Monday 14. AKI on CKD  Creatinine 2.21 7/26  Free water flushes increased  15. Anemia of chronic disease  Hemoglobin 12.6 on 7/26  Cont to monitor 6. Transaminitis  Labs ordered for Monday  LOS (Days) 2 A FACE TO FACE EVALUATION WAS PERFORMED  Ankit Lorie Phenix 02/04/2018, 12:37 PM

## 2018-02-05 LAB — GLUCOSE, CAPILLARY
GLUCOSE-CAPILLARY: 142 mg/dL — AB (ref 70–99)
GLUCOSE-CAPILLARY: 164 mg/dL — AB (ref 70–99)
GLUCOSE-CAPILLARY: 128 mg/dL — AB (ref 70–99)
GLUCOSE-CAPILLARY: 110 mg/dL — AB (ref 70–99)
Glucose-Capillary: 159 mg/dL — ABNORMAL HIGH (ref 70–99)

## 2018-02-05 NOTE — Progress Notes (Signed)
[name] did not want staffs to check her husband blood sugar, she said she had discussed it with MD, diabetic team and nutritionist. Refused night time insulin for patient and asked staffs not check or give insulin to patient. MD notified. We continue to monitor

## 2018-02-05 NOTE — Progress Notes (Signed)
Subjective/Complaints: Patient seen sitting up in bed this morning.  No reported issues overnight.  Per nursing, wife does not want CBGs checked.  Review of systems: Unable to obtain due to aphasia  Objective: Vital Signs: Blood pressure 117/85, pulse 80, temperature 98 F (36.7 C), temperature source Oral, resp. rate 19, height '5\' 9"'$  (1.753 m), weight 59 kg (130 lb 1.1 oz), SpO2 100 %. Ct Abdomen Wo Contrast  Result Date: 02/04/2018 CLINICAL DATA:  Hemorrhagic stroke, dysphagia and malnutrition. Assessment for percutaneous gastrostomy tube placement. EXAM: CT ABDOMEN WITHOUT CONTRAST TECHNIQUE: Multidetector CT imaging of the abdomen was performed following the standard protocol without IV contrast. COMPARISON:  None. FINDINGS: Lower chest: Bibasilar densities present. Subtle pneumonia at the posterior left lung base cannot be excluded. No visible pleural effusions. Hepatobiliary: Unenhanced appearance of the liver is unremarkable. There is some excreted contrast in the gallbladder with filling defects representing calculi. No evidence of gallbladder distention or inflammation. Pancreas: Unremarkable. No pancreatic ductal dilatation or surrounding inflammatory changes. Spleen: Normal in size without focal abnormality. Adrenals/Urinary Tract: Adrenal glands are unremarkable. Kidneys are normal, without renal calculi, focal lesion, or hydronephrosis. Stomach/Bowel: Feeding tube present extending into the second portion of the duodenum. No hiatal hernia. Gastric anatomy is normal. Bowel shows no evidence of obstruction or ileus. No free air identified. No incidental masses or ascites. Vascular/Lymphatic: No significant vascular findings are present. No enlarged abdominal or pelvic lymph nodes. Other: No hernias identified.  No evidence of focal abscess. Musculoskeletal: No acute or significant osseous findings. IMPRESSION: 1. Normal gastric anatomy with no contraindication to percutaneous gastrostomy tube  placement. 2. Cholelithiasis with vicarious excretion of contrast into the gallbladder lumen. 3. Bibasilar pulmonary opacities. Subtle pneumonia at the posterior left lung base cannot be excluded. Electronically Signed   By: Aletta Edouard M.D.   On: 02/04/2018 08:10   Ct Head Wo Contrast  Result Date: 02/03/2018 CLINICAL DATA:  Follow-up examination for intracranial hemorrhage. Found sitting on floor. EXAM: CT HEAD WITHOUT CONTRAST TECHNIQUE: Contiguous axial images were obtained from the base of the skull through the vertex without intravenous contrast. COMPARISON:  Prior CT from 01/28/2018 FINDINGS: Brain: Previously identified large intraparenchymal hemorrhage centered at the left frontal white matter overall similar in size and morphology, although is slightly decreased in density as compared to previous exam, compatible with evolving hematoma. Associated regional mass effect with vasogenic edema is slightly worsened, with slightly worsened 6 mm of left-to-right shift. Small volume degenerating blood products present within the occipital horns of both lateral ventricles. Ventricular size relatively stable without hydrocephalus or ventricular trapping. Basilar cisterns remain patent. No new intracranial hemorrhage. No other acute intracranial abnormality. No other acute large vessel territory infarct. No extra-axial fluid collection. No mass lesion. Vascular: No hyperdense vessel. Scattered vascular calcifications noted within the carotid siphons. Skull: Scalp soft tissues and calvarium demonstrate no acute abnormality. Sinuses/Orbits: Globes and orbital soft tissues demonstrate no acute finding chronic mucosal thickening within the right maxillary sinus. Nasogastric tube in place. Trace opacity right mastoid air cells. Other: None. IMPRESSION: 1. Continued interval evolution of large intraparenchymal hemorrhage centered at the left frontal lobe, similar in size and morphology from previous. Mildly  increased vasogenic edema with regional mass effect, with slightly worsened 6 mm right-to-left midline shift. 2. Small volume intraventricular hemorrhage, similar to previous. No hydrocephalus or ventricular trapping. 3. No other new acute intracranial abnormality. Electronically Signed   By: Jeannine Boga M.D.   On: 02/03/2018 21:32   Results for  orders placed or performed during the hospital encounter of 02/02/18 (from the past 72 hour(s))  Glucose, capillary     Status: Abnormal   Collection Time: 02/02/18  8:49 PM  Result Value Ref Range   Glucose-Capillary 104 (H) 70 - 99 mg/dL  Glucose, capillary     Status: Abnormal   Collection Time: 02/03/18  2:06 AM  Result Value Ref Range   Glucose-Capillary 164 (H) 70 - 99 mg/dL  Glucose, capillary     Status: Abnormal   Collection Time: 02/03/18  4:08 AM  Result Value Ref Range   Glucose-Capillary 172 (H) 70 - 99 mg/dL  CBC WITH DIFFERENTIAL     Status: Abnormal   Collection Time: 02/03/18  5:51 AM  Result Value Ref Range   WBC 8.8 4.0 - 10.5 K/uL   RBC 4.88 4.22 - 5.81 MIL/uL   Hemoglobin 12.6 (L) 13.0 - 17.0 g/dL   HCT 42.5 39.0 - 52.0 %   MCV 87.1 78.0 - 100.0 fL   MCH 25.8 (L) 26.0 - 34.0 pg   MCHC 29.6 (L) 30.0 - 36.0 g/dL   RDW 15.7 (H) 11.5 - 15.5 %   Platelets 276 150 - 400 K/uL   Neutrophils Relative % 71 %   Neutro Abs 6.3 1.7 - 7.7 K/uL   Lymphocytes Relative 12 %   Lymphs Abs 1.0 0.7 - 4.0 K/uL   Monocytes Relative 9 %   Monocytes Absolute 0.8 0.1 - 1.0 K/uL   Eosinophils Relative 6 %   Eosinophils Absolute 0.6 0.0 - 0.7 K/uL   Basophils Relative 1 %   Basophils Absolute 0.1 0.0 - 0.1 K/uL   Immature Granulocytes 1 %   Abs Immature Granulocytes 0.1 0.0 - 0.1 K/uL    Comment: Performed at [facility], [address], [place], [place] [address]  Comprehensive metabolic panel     Status: Abnormal   Collection Time: 02/03/18  5:51 AM  Result Value Ref Range   Sodium 157 (H) 135 - 145 mmol/L   Potassium  3.9 3.5 - 5.1 mmol/L   Chloride 119 (H) 98 - 111 mmol/L   CO2 27 22 - 32 mmol/L   Glucose, Bld 141 (H) 70 - 99 mg/dL   BUN 58 (H) 8 - 23 mg/dL   Creatinine, Ser 2.21 (H) 0.61 - 1.24 mg/dL   Calcium 9.3 8.9 - 10.3 mg/dL   Total Protein 6.9 6.5 - 8.1 g/dL   Albumin 3.2 (L) 3.5 - 5.0 g/dL   AST 40 15 - 41 U/L   ALT 139 (H) 0 - 44 U/L   Alkaline Phosphatase 52 38 - 126 U/L   Total Bilirubin 0.5 0.3 - 1.2 mg/dL   GFR calc non Af Amer 28 (L) >60 mL/min   GFR calc Af Amer 33 (L) >60 mL/min    Comment: (NOTE) The eGFR has been calculated using the CKD EPI equation. This calculation has not been validated in all clinical situations. eGFR's persistently <60 mL/min signify possible Chronic Kidney Disease.    Anion gap 11 5 - 15    Comment: Performed at [facility] [address], [place],  [address]  Glucose, capillary     Status: None   Collection Time: 02/03/18  8:00 AM  Result Value Ref Range   Glucose-Capillary 98 70 - 99 mg/dL   Comment 1 Notify RN   Glucose, capillary     Status: Abnormal   Collection Time: 02/03/18 11:47 AM  Result  Value Ref Range   Glucose-Capillary 153 (H) 70 - 99 mg/dL   Comment 1 Notify RN   Glucose, capillary     Status: Abnormal   Collection Time: 02/03/18  5:05 PM  Result Value Ref Range   Glucose-Capillary 176 (H) 70 - 99 mg/dL   Comment 1 Notify RN   Glucose, capillary     Status: None   Collection Time: 02/03/18  8:08 PM  Result Value Ref Range   Glucose-Capillary 95 70 - 99 mg/dL  Glucose, capillary     Status: Abnormal   Collection Time: 02/04/18 12:20 AM  Result Value Ref Range   Glucose-Capillary 149 (H) 70 - 99 mg/dL   Comment 1 Notify RN   Glucose, capillary     Status: Abnormal   Collection Time: 02/04/18  4:19 AM  Result Value Ref Range   Glucose-Capillary 140 (H) 70 - 99 mg/dL   Comment 1 Notify RN   Glucose, capillary     Status: Abnormal   Collection Time: 02/04/18  8:51 AM  Result Value Ref Range    Glucose-Capillary 129 (H) 70 - 99 mg/dL   Comment 1 Notify RN   Glucose, capillary     Status: Abnormal   Collection Time: 02/04/18 11:57 AM  Result Value Ref Range   Glucose-Capillary 144 (H) 70 - 99 mg/dL   Comment 1 Notify RN   Glucose, capillary     Status: Abnormal   Collection Time: 02/04/18  4:14 PM  Result Value Ref Range   Glucose-Capillary 148 (H) 70 - 99 mg/dL   Comment 1 Document in Chart   Glucose, capillary     Status: Abnormal   Collection Time: 02/04/18  8:15 PM  Result Value Ref Range   Glucose-Capillary 123 (H) 70 - 99 mg/dL   Comment 1 Notify RN   Glucose, capillary     Status: Abnormal   Collection Time: 02/05/18  9:10 AM  Result Value Ref Range   Glucose-Capillary 142 (H) 70 - 99 mg/dL  Glucose, capillary     Status: Abnormal   Collection Time: 02/05/18 11:34 AM  Result Value Ref Range   Glucose-Capillary 164 (H) 70 - 99 mg/dL     HENT: normocephalic. Atraumatic. Poor dentition Eyes: EOMI. No discharge. Cardio: RRR. No JVD. Resp: CTA bilaterally.  Unlabored. GI: BS positive and nondistended. +NG. Skin:   Intact. Warm and dry. Neuro: Alert Motor: limited due to aphasia, but appears to be 0/5 in the right upper extremity, 0/5 right lower extremity Spontaneously moving right upper and right lower extremity. Musc/Skel:  No edema or tenderness in extremities General no acute distress. Vital signs reviewed.   Assessment/Plan: 1. 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 Physiatrist is providing close team supervision and 24 hour management of active medical problems listed below. Physiatrist and rehab team continue to assess barriers to discharge/monitor patient progress toward functional and medical goals. FIM: Function - Bathing Position: Wheelchair/chair  at sink Body parts bathed by patient: Chest, Abdomen Body parts bathed by helper: Right arm, Left arm, Front perineal area, Buttocks, Right upper leg, Left upper leg, Right lower leg, Left lower leg, Back Assist Level: 2 helpers  Function- Upper Body Dressing/Undressing What is the patient wearing?: Button up shirt Button up shirt - Perfomed by helper: Thread/unthread right sleeve, Thread/unthread left sleeve, Button/unbutton shirt, Pull shirt  around back Assist Level: (Total assist) Function - Lower Body Dressing/Undressing What is the patient wearing?: Pants, Non-skid slipper socks Position: Wheelchair/chair at sink Pants- Performed by helper: Thread/unthread right pants leg, Thread/unthread left pants leg, Pull pants up/down Non-skid slipper socks- Performed by helper: Don/doff right sock, Don/doff left sock Assist for footwear: Dependant Assist for lower body dressing: 2 Helpers  Function - Toileting Toileting activity did not occur: No continent bowel/bladder event     Function - Chair/bed transfer Chair/bed transfer method: Stand pivot Chair/bed transfer assist level: 2 helpers Chair/bed transfer assistive device: Mechanical lift Mechanical lift: Stedy Chair/bed transfer details: Tactile cues for initiation, Tactile cues for weight shifting, Tactile cues for posture, Tactile cues for sequencing, Tactile cues for placement, Verbal cues for technique, Verbal cues for sequencing, Manual facilitation for weight shifting, Manual facilitation for placement  Function - Locomotion: Wheelchair Will patient use wheelchair at discharge?: Yes Wheelchair activity did not occur: Safety/medical concerns Wheel 50 feet with 2 turns activity did not occur: Safety/medical concerns Wheel 150 feet activity did not occur: Safety/medical concerns Turns around,maneuvers to table,bed, and toilet,negotiates 3% grade,maneuvers on rugs and over doorsills: No Function - Locomotion: Ambulation Assistive  device: (3 muskateers + w/c follow) Max distance: 3 ft Assist level: 2 helpers Walk 10 feet activity did not occur: Safety/medical concerns Walk 50 feet with 2 turns activity did not occur: Safety/medical concerns Walk 150 feet activity did not occur: Safety/medical concerns Walk 10 feet on uneven surfaces activity did not occur: Safety/medical concerns  Function - Comprehension Comprehension: Auditory Comprehension assist level: Understands basic 25 - 49% of the time/ requires cueing 50 - 75% of the time  Function - Expression Expression: Verbal Expression assist level: Expresses basic 25 - 49% of the time/requires cueing 50 - 75% of the time. Uses single words/gestures.  Function - Social Interaction Social Interaction assist level: Interacts appropriately 25 - 49% of time - Needs frequent redirection.  Function - Problem Solving Problem solving assist level: Solves basic 25 - 49% of the time - needs direction more than half the time to initiate, plan or complete simple activities  Function - Memory Memory assist level: Recognizes or recalls 25 - 49% of the time/requires cueing 50 - 75% of the time Patient normally able to recall (first 3 days only): None of the above  Medical Problem List and Plan: 1.Right side weakness with facial droop and dysphasiasecondary to large left frontoparietal hemorrhagic infarction with vasogenic edema and mass-effect  Continue CIR 2. DVT Prophylaxis/Anticoagulation: SCDs. Monitor for any signs of DVT 3. Pain Management:Zanaflex 4 mg nightly 4. Mood:Provide emotional support 5. Neuropsych: This patientis notcapable of making decisions on hisown behalf. 6. Skin/Wound Care:Routine skin checks 7. Fluids/Electrolytes/Nutrition:Routine in and outs 8.Dysphagia: NPO/Cortrakin place.  Prognosis for recovery of swallowing is poor.  Recommend PEG.  Likely will be a prolonged recovery.  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   Lisinopril 20 mg daily, Norvasc 10 mg daily. Monitor with increased mobility  Slightly labile on 7/28 10.Diabetes mellitus. Hemoglobin A1c 6.0. SSI. 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 12.Hyperlipidemia. Fenofibrate. 13. Hypernatremia  Sodium 157 on 7/26  Free water flushes increased  Labs ordered for tomorrow 14. AKI on CKD  Creatinine 2.21 7/26  Free  water flushes increased  15. Anemia of chronic disease  Hemoglobin 12.6 on 7/26  Cont to monitor 6. Transaminitis  Labs ordered for tomorrow  LOS (Days) 3 A FACE TO FACE EVALUATION WAS PERFORMED  Ankit Lorie Phenix 02/05/2018, 1:29 PM

## 2018-02-06 ENCOUNTER — Inpatient Hospital Stay (HOSPITAL_COMMUNITY): Payer: Medicare Other | Admitting: Occupational Therapy

## 2018-02-06 ENCOUNTER — Inpatient Hospital Stay (HOSPITAL_COMMUNITY): Payer: Medicare Other

## 2018-02-06 ENCOUNTER — Inpatient Hospital Stay (HOSPITAL_COMMUNITY): Payer: Medicare Other | Admitting: Physical Therapy

## 2018-02-06 ENCOUNTER — Inpatient Hospital Stay (HOSPITAL_COMMUNITY): Payer: Medicare Other | Admitting: Speech Pathology

## 2018-02-06 ENCOUNTER — Encounter (HOSPITAL_COMMUNITY): Payer: Self-pay | Admitting: Diagnostic Radiology

## 2018-02-06 DIAGNOSIS — N289 Disorder of kidney and ureter, unspecified: Secondary | ICD-10-CM

## 2018-02-06 DIAGNOSIS — N189 Chronic kidney disease, unspecified: Secondary | ICD-10-CM

## 2018-02-06 HISTORY — PX: IR GASTROSTOMY TUBE MOD SED: IMG625

## 2018-02-06 LAB — COMPREHENSIVE METABOLIC PANEL
ALBUMIN: 3.2 g/dL — AB (ref 3.5–5.0)
ALT: 55 U/L — AB (ref 0–44)
AST: 23 U/L (ref 15–41)
Alkaline Phosphatase: 50 U/L (ref 38–126)
Anion gap: 9 (ref 5–15)
BILIRUBIN TOTAL: 0.5 mg/dL (ref 0.3–1.2)
BUN: 71 mg/dL — AB (ref 8–23)
CHLORIDE: 115 mmol/L — AB (ref 98–111)
CO2: 28 mmol/L (ref 22–32)
Calcium: 9.3 mg/dL (ref 8.9–10.3)
Creatinine, Ser: 2.3 mg/dL — ABNORMAL HIGH (ref 0.61–1.24)
GFR calc Af Amer: 31 mL/min — ABNORMAL LOW (ref >60)
GFR calc non Af Amer: 27 mL/min — ABNORMAL LOW (ref >60)
Glucose, Bld: 152 mg/dL — ABNORMAL HIGH (ref 70–99)
Potassium: 4.3 mmol/L (ref 3.5–5.1)
Sodium: 152 mmol/L — ABNORMAL HIGH (ref 135–145)
TOTAL PROTEIN: 6.8 g/dL (ref 6.5–8.1)

## 2018-02-06 LAB — GLUCOSE, CAPILLARY
GLUCOSE-CAPILLARY: 120 mg/dL — AB (ref 70–99)
GLUCOSE-CAPILLARY: 139 mg/dL — AB (ref 70–99)
Glucose-Capillary: 142 mg/dL — ABNORMAL HIGH (ref 70–99)
Glucose-Capillary: 137 mg/dL — ABNORMAL HIGH (ref 70–99)
Glucose-Capillary: 144 mg/dL — ABNORMAL HIGH (ref 70–99)

## 2018-02-06 MED ORDER — MIDAZOLAM HCL 2 MG/2ML IJ SOLN
INTRAMUSCULAR | Status: AC | PRN
  Administered 2018-02-06 (×3): 1 mg via INTRAVENOUS

## 2018-02-06 MED ORDER — GLUCAGON HCL RDNA (DIAGNOSTIC) 1 MG IJ SOLR
INTRAMUSCULAR | Status: AC
  Filled 2018-02-06: qty 1

## 2018-02-06 MED ORDER — CEFAZOLIN SODIUM-DEXTROSE 2-4 GM/100ML-% IV SOLN
1.0000 g | Freq: Once | INTRAVENOUS | Status: AC
  Administered 2018-02-06: 1 g via INTRAVENOUS
  Filled 2018-02-06: qty 100

## 2018-02-06 MED ORDER — LIDOCAINE HCL 1 % IJ SOLN
INTRAMUSCULAR | Status: AC
  Filled 2018-02-06: qty 20

## 2018-02-06 MED ORDER — ACETAMINOPHEN 650 MG RE SUPP
650.0000 mg | RECTAL | Status: DC | PRN
  Administered 2018-02-06 – 2018-02-08 (×4): 650 mg via RECTAL
  Filled 2018-02-06 (×5): qty 1

## 2018-02-06 MED ORDER — CEFAZOLIN SODIUM-DEXTROSE 2-4 GM/100ML-% IV SOLN
INTRAVENOUS | Status: AC
  Administered 2018-02-06: 1 g via INTRAVENOUS
  Filled 2018-02-06: qty 100

## 2018-02-06 MED ORDER — SODIUM CHLORIDE 0.45 % NICU IV INFUSION SIMPLE
INJECTION | INTRAVENOUS | Status: DC
  Administered 2018-02-06 – 2018-02-07 (×3): 75 mL/h via INTRAVENOUS
  Filled 2018-02-06 (×7): qty 500

## 2018-02-06 MED ORDER — FENTANYL CITRATE (PF) 100 MCG/2ML IJ SOLN
INTRAMUSCULAR | Status: AC
  Filled 2018-02-06: qty 4

## 2018-02-06 MED ORDER — LIDOCAINE HCL 1 % IJ SOLN
INTRAMUSCULAR | Status: AC | PRN
  Administered 2018-02-06: 5 mL

## 2018-02-06 MED ORDER — MIDAZOLAM HCL 2 MG/2ML IJ SOLN
INTRAMUSCULAR | Status: AC
  Filled 2018-02-06: qty 4

## 2018-02-06 MED ORDER — IOPAMIDOL (ISOVUE-300) INJECTION 61%
INTRAVENOUS | Status: AC
  Administered 2018-02-06: 10 mL
  Filled 2018-02-06: qty 50

## 2018-02-06 MED ORDER — FENTANYL CITRATE (PF) 100 MCG/2ML IJ SOLN
INTRAMUSCULAR | Status: AC | PRN
  Administered 2018-02-06: 50 ug via INTRAVENOUS
  Administered 2018-02-06 (×2): 25 ug via INTRAVENOUS

## 2018-02-06 NOTE — Sedation Documentation (Signed)
02 2L applied

## 2018-02-06 NOTE — Procedures (Signed)
Placement of percutaneous gastrostomy tube.  20 Fr drain placed.  Minimal blood loss and no immediate complication.

## 2018-02-06 NOTE — Significant Event (Signed)
Wife states she does not want any insulin given, wants CBGs to be discontinued. Sticky note left for MD

## 2018-02-06 NOTE — Progress Notes (Signed)
Occupational Therapy Session Note  Patient Details  Name: Adam Yang MRN: [id number] Date of Birth: [birth year]  Today's Date: 02/06/2018 OT Individual Time: 5993-5701 OT Individual Time Calculation (min): 30 min    Short Term Goals: Week 1:  OT Short Term Goal 1 (Week 1): Pt will complete sit > stand with mod assist to decrease burden of care with LB bathing/dressing OT Short Term Goal 2 (Week 1): Pt will complete bathing with max assist with mod cues for sequencing OT Short Term Goal 3 (Week 1): Pt will complete UB dressing with max assist OT Short Term Goal 4 (Week 1): Pt will complete toilet transfer with max assist of 1 caregiver  Skilled Therapeutic Interventions/Progress Updates:    Treatment session with focus on attention to task and visual scanning.  Pt received in handoff from PT and SLP reporting pt in pain from PEG placement.  Attempted to engage pt in visual scanning and attention to complete matching activity with single colored cards and chips.  Pt able to take card when placed in his hand but unable to match to card on table in Lt visual field.  Pt moaning throughout session and reaching for abdomen.  Returned to room due to pain and inability to participate 2/2 pain.  Transferred back to bed with MaxiMove due to pain and decreased attention.  RN present to assist with transfer and then administer pain meds.  Therapist assisted with positioning pt to allow for medication administration, then repositioned in bed to comfort.  Pt missed 30 mins due to pain in abdomen post PEG placement.  Therapy Documentation Precautions:  Precautions Precautions: Fall Precaution Comments: R hemi, R inattention Restrictions Weight Bearing Restrictions: No General: General OT Amount of Missed Time: 30 Minutes Vital Signs: Therapy Vitals Pulse Rate: 97 Resp: 18 BP: (!) 145/87 Patient Position (if appropriate): Sitting Oxygen Therapy SpO2: 96 % O2 Device: Room Air Pain: Pain  Assessment Pain Scale: Faces Faces Pain Scale: Hurts even more Pain Location: Abdomen Pain Intervention(s): RN made aware;Repositioned  See Function Navigator for Current Functional Status.   Therapy/Group: Individual Therapy  Simonne Come 02/06/2018, 3:59 PM

## 2018-02-06 NOTE — Sedation Documentation (Signed)
Patient is resting comfortably. 

## 2018-02-06 NOTE — Progress Notes (Signed)
Subjective/Complaints: Pt up with therapy. No new issues. CBG concern noted.   ROS: limited due to language/communication    Objective: Vital Signs: Blood pressure (!) 145/87, pulse 97, temperature (!) 97.5 F (36.4 C), temperature source Oral, resp. rate 18, height 5' 9" (1.753 m), weight 59 kg (130 lb 1.1 oz), SpO2 96 %. Ir Gastrostomy Tube Mod Sed  Result Date: 02/06/2018 INDICATION: 72 year old with intracranial hemorrhage and dysphagia. Patient needs a percutaneous gastrostomy tube for nutrition. EXAM: PERCUTANEOUS GASTROSTOMY TUBE WITH FLUOROSCOPIC GUIDANCE Physician: Stephan Minister. Henn, MD MEDICATIONS: Ancef 1 g; Antibiotics were administered within 1 hour of the procedure. ANESTHESIA/SEDATION: Versed 3 mg IV; Fentanyl 100 mcg IV Moderate Sedation Time:  15 minutes The patient was continuously monitored during the procedure by the interventional radiology nurse under my direct supervision. FLUOROSCOPY TIME:  Fluoroscopy Time: 4 minutes 6 seconds (6 mGy). COMPLICATIONS: None immediate. PROCEDURE: The procedure was explained to the patient's wife. The risks and benefits of the procedure were discussed and the patient's wife's questions were addressed. Informed consent was obtained from the patient. The patient was placed on the interventional table. An orogastric tube was placed with fluoroscopic guidance. The anterior abdomen was prepped and draped in sterile fashion. Maximal barrier sterile technique was utilized including caps, mask, sterile gowns, sterile gloves, sterile drape, hand hygiene and skin antiseptic. Stomach was inflated with air through the orogastric tube. The skin and subcutaneous tissues were anesthetized with 1% lidocaine. A 17 gauge needle was directed into the distended stomach with fluoroscopic guidance. A wire was advanced into the stomach and a T-tact was deployed. A 9-French vascular sheath was placed and the orogastric tube was snared using a [employer] snare device. The  orogastric tube and snare were pulled out of the patient's mouth. The snare device was connected to a 20-French gastrostomy tube. The snare device and gastrostomy tube were pulled through the patient's mouth and out the anterior abdominal wall. The gastrostomy tube was cut to an appropriate length. Contrast injection through gastrostomy tube confirmed placement within the stomach. Fluoroscopic images were obtained for documentation. The gastrostomy tube was flushed with normal saline. IMPRESSION: Successful fluoroscopic guided percutaneous gastrostomy tube placement. Electronically Signed   By: Markus Daft M.D.   On: 02/06/2018 12:33   Results for orders placed or performed during the hospital encounter of 02/02/18 (from the past 72 hour(s))  Glucose, capillary     Status: None   Collection Time: 02/03/18  8:08 PM  Result Value Ref Range   Glucose-Capillary 95 70 - 99 mg/dL  Glucose, capillary     Status: Abnormal   Collection Time: 02/04/18 12:20 AM  Result Value Ref Range   Glucose-Capillary 149 (H) 70 - 99 mg/dL   Comment 1 Notify RN   Glucose, capillary     Status: Abnormal   Collection Time: 02/04/18  4:19 AM  Result Value Ref Range   Glucose-Capillary 140 (H) 70 - 99 mg/dL   Comment 1 Notify RN   Glucose, capillary     Status: Abnormal   Collection Time: 02/04/18  8:51 AM  Result Value Ref Range   Glucose-Capillary 129 (H) 70 - 99 mg/dL   Comment 1 Notify RN   Glucose, capillary     Status: Abnormal   Collection Time: 02/04/18 11:57 AM  Result Value Ref Range   Glucose-Capillary 144 (H) 70 - 99 mg/dL   Comment 1 Notify RN   Glucose, capillary     Status: Abnormal   Collection Time: 02/04/18  4:14 PM  Result Value Ref Range   Glucose-Capillary 148 (H) 70 - 99 mg/dL   Comment 1 Document in Chart   Glucose, capillary     Status: Abnormal   Collection Time: 02/04/18  8:15 PM  Result Value Ref Range   Glucose-Capillary 123 (H) 70 - 99 mg/dL   Comment 1 Notify RN   Glucose,  capillary     Status: Abnormal   Collection Time: 02/05/18  9:10 AM  Result Value Ref Range   Glucose-Capillary 142 (H) 70 - 99 mg/dL  Glucose, capillary     Status: Abnormal   Collection Time: 02/05/18 11:34 AM  Result Value Ref Range   Glucose-Capillary 164 (H) 70 - 99 mg/dL  Glucose, capillary     Status: Abnormal   Collection Time: 02/05/18  4:49 PM  Result Value Ref Range   Glucose-Capillary 128 (H) 70 - 99 mg/dL  Glucose, capillary     Status: Abnormal   Collection Time: 02/05/18  8:10 PM  Result Value Ref Range   Glucose-Capillary 110 (H) 70 - 99 mg/dL  Glucose, capillary     Status: Abnormal   Collection Time: 02/05/18 11:46 PM  Result Value Ref Range   Glucose-Capillary 159 (H) 70 - 99 mg/dL  Glucose, capillary     Status: Abnormal   Collection Time: 02/06/18  4:07 AM  Result Value Ref Range   Glucose-Capillary 142 (H) 70 - 99 mg/dL  Glucose, capillary     Status: Abnormal   Collection Time: 02/06/18  8:11 AM  Result Value Ref Range   Glucose-Capillary 137 (H) 70 - 99 mg/dL  Glucose, capillary     Status: Abnormal   Collection Time: 02/06/18 11:32 AM  Result Value Ref Range   Glucose-Capillary 120 (H) 70 - 99 mg/dL  Comprehensive metabolic panel     Status: Abnormal   Collection Time: 02/06/18 12:10 PM  Result Value Ref Range   Sodium 152 (H) 135 - 145 mmol/L   Potassium 4.3 3.5 - 5.1 mmol/L   Chloride 115 (H) 98 - 111 mmol/L   CO2 28 22 - 32 mmol/L   Glucose, Bld 152 (H) 70 - 99 mg/dL   BUN 71 (H) 8 - 23 mg/dL   Creatinine, Ser 2.30 (H) 0.61 - 1.24 mg/dL   Calcium 9.3 8.9 - 10.3 mg/dL   Total Protein 6.8 6.5 - 8.1 g/dL   Albumin 3.2 (L) 3.5 - 5.0 g/dL   AST 23 15 - 41 U/L   ALT 55 (H) 0 - 44 U/L   Alkaline Phosphatase 50 38 - 126 U/L   Total Bilirubin 0.5 0.3 - 1.2 mg/dL   GFR calc non Af Amer 27 (L) >60 mL/min   GFR calc Af Amer 31 (L) >60 mL/min    Comment: (NOTE) The eGFR has been calculated using the CKD EPI equation. This calculation has not been  validated in all clinical situations. eGFR's persistently <60 mL/min signify possible Chronic Kidney Disease.    Anion gap 9 5 - 15    Comment: Performed at [facility] [address], [place], [place] [address]  Glucose, capillary     Status: Abnormal   Collection Time: 02/06/18  4:23 PM  Result Value Ref Range   Glucose-Capillary 144 (H) 70 - 99 mg/dL    AM physical exam Constitutional: No distress . Vital signs reviewed. HEENT: EOMI, oral membranes moist, NGT Neck: supple Cardiovascular: RRR without murmur. No JVD    Respiratory: CTA Bilaterally  without wheezes or rales. Normal effort    GI: BS +, non-tender, non-distended  Skin:   Intact. Warm and dry. Neuro: Alert Motor: limited due to aphasia, 0/5 Right side Musc/Skel:  No edema or tenderness in extremities Psych: flat  Assessment/Plan: 1. 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 Physiatrist is providing close team supervision and 24 hour management of active medical problems listed below. Physiatrist and rehab team continue to assess barriers to discharge/monitor patient progress toward functional and medical goals. FIM: Function - Bathing Position: Wheelchair/chair at sink Body parts bathed by patient: Chest, Abdomen Body parts bathed by helper: Right arm, Left arm, Front perineal area, Buttocks, Right upper leg, Left upper leg, Right lower leg, Left lower leg, Back Assist Level: 2 helpers  Function- Upper Body Dressing/Undressing What is the patient wearing?: Button up shirt Button up shirt - Perfomed by helper: Thread/unthread right sleeve, Thread/unthread left sleeve, Button/unbutton shirt, Pull shirt around back Assist Level: (Total assist) Function - Lower Body Dressing/Undressing What is the patient  wearing?: Pants, Non-skid slipper socks Position: Wheelchair/chair at sink Pants- Performed by helper: Thread/unthread right pants leg, Thread/unthread left pants leg, Pull pants up/down Non-skid slipper socks- Performed by helper: Don/doff right sock, Don/doff left sock Assist for footwear: Dependant Assist for lower body dressing: 2 Helpers  Function - Toileting Toileting activity did not occur: No continent bowel/bladder event Assist level: Two helpers  Function - Air cabin crew transfer activity did not occur: Safety/medical concerns  Function - Chair/bed transfer Chair/bed transfer method: Squat pivot Chair/bed transfer assist level: 2 helpers Chair/bed transfer assistive device: Armrests Mechanical lift: Stedy Chair/bed transfer details: Tactile cues for initiation, Tactile cues for weight shifting, Tactile cues for posture, Tactile cues for sequencing, Tactile cues for placement, Verbal cues for technique, Verbal cues for sequencing, Manual facilitation for weight shifting, Manual facilitation for placement  Function - Locomotion: Wheelchair Will patient use wheelchair at discharge?: Yes Wheelchair activity did not occur: Safety/medical concerns Assist Level: Dependent (Pt equals 0%) Wheel 50 feet with 2 turns activity did not occur: Safety/medical concerns Wheel 150 feet activity did not occur: Safety/medical concerns Turns around,maneuvers to table,bed, and toilet,negotiates 3% grade,maneuvers on rugs and over doorsills: No Function - Locomotion: Ambulation Assistive device: (3 muskateers + w/c follow) Max distance: 3 ft Assist level: 2 helpers Walk 10 feet activity did not occur: Safety/medical concerns Walk 50 feet with 2 turns activity did not occur: Safety/medical concerns Walk 150 feet activity did not occur: Safety/medical concerns Walk 10 feet on uneven surfaces activity did not occur: Safety/medical concerns  Function - Comprehension Comprehension:  Auditory Comprehension assist level: Understands basic 25 - 49% of the time/ requires cueing 50 - 75% of the time  Function - Expression Expression: Verbal Expression assist level: Expresses basic 25 - 49% of the time/requires cueing 50 - 75% of the time. Uses single words/gestures.  Function - Social Interaction Social Interaction assist level: Interacts appropriately 25 - 49% of time - Needs frequent redirection.  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 DVT Prophylaxis/Anticoagulation: SCDs. Monitor for any signs of DVT 3. Pain Management:Zanaflex 4 mg nightly 4. Mood:Provide emotional support 5. Neuropsych: This patientis notcapable of making decisions on hisown behalf. 6. Skin/Wound Care:Routine skin checks 7. Fluids/Electrolytes/Nutrition:Routine in and outs 8.Dysphagia: PEG placed today per IR  -follow for healing 9.Hypertension.   Lisinopril 20 mg daily, Norvasc 10 mg daily. Monitor with increased mobility  Fair control 7/29 10.Diabetes mellitus. Hemoglobin A1c 6.0. SSI. 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 12.Hyperlipidemia. Fenofibrate. 13. Hypernatremia  Sodium 152 today  Free water  flushes increased   Serial labs 14. AKI on CKD  Creatinine 2.30/71 despite water increase  -serial labs  -begin IVF this evening  15. Anemia of chronic disease  Hemoglobin 12.6 on 7/29  Cont to monitor 6. Transaminitis  resolved  LOS (Days) 4 A FACE TO FACE EVALUATION WAS PERFORMED  Meredith Staggers 02/06/2018, 6:36 PM

## 2018-02-06 NOTE — Discharge Instructions (Signed)
Inpatient Rehab Discharge Instructions  Adam Yang Discharge date and time: No discharge date for patient encounter.   Activities/Precautions/ Functional Status: Activity: activity as tolerated Diet:  Wound Care: keep wound clean and dry Functional status:  ___ No restrictions     ___ Walk up steps independently ___ 24/7 supervision/assistance   ___ Walk up steps with assistance ___ Intermittent supervision/assistance  ___ Bathe/dress independently ___ Walk with walker     _x__ Bathe/dress with assistance ___ Walk Independently    ___ Shower independently ___ Walk with assistance    ___ Shower with assistance ___ No alcohol     ___ Return to work/school ________  Special Instructions:  Gastrostomy tube feeds as directed STROKE/TIA DISCHARGE INSTRUCTIONS SMOKING Cigarette smoking nearly doubles your risk of having a stroke & is the single most alterable risk factor  If you smoke or have smoked in the last 12 months, you are advised to quit smoking for your health.  Most of the excess cardiovascular risk related to smoking disappears within a year of stopping.  Ask you doctor about anti-smoking medications  [place] Quit Line: 1-800-QUIT NOW  Free Smoking Cessation Classes [phone]  CHOLESTEROL Know your levels; limit fat & cholesterol in your diet  Lipid Panel     Component Value Date/Time   CHOL 231 (H) 01/27/2018 0459   TRIG 133 02/02/2018 0436   HDL 49 01/27/2018 0459   CHOLHDL 4.7 01/27/2018 0459   VLDL 71 (H) 01/27/2018 0459   LDLCALC 111 (H) 01/27/2018 0459      Many patients benefit from treatment even if their cholesterol is at goal.  Goal: Total Cholesterol (CHOL) less than 160  Goal:  Triglycerides (TRIG) less than 150  Goal:  HDL greater than 40  Goal:  LDL (LDLCALC) less than 100   BLOOD PRESSURE American Stroke Association blood pressure target is less that 120/80 mm/Hg  Your discharge blood pressure is:  BP: 130/73  Monitor your blood  pressure  Limit your salt and alcohol intake  Many individuals will require more than one medication for high blood pressure  DIABETES (A1c is a blood sugar average for last 3 months) Goal HGBA1c is under 7% (HBGA1c is blood sugar average for last 3 months)  Diabetes:   Lab Results  Component Value Date   HGBA1C 6.0 (H) 01/27/2018     Your HGBA1c can be lowered with medications, healthy diet, and exercise.  Check your blood sugar as directed by your physician  Call your physician if you experience unexplained or low blood sugars.  PHYSICAL ACTIVITY/REHABILITATION Goal is 30 minutes at least 4 days per week  Activity: Increase activity slowly, Therapies: Physical Therapy: Home Health Return to work:   Activity decreases your risk of heart attack and stroke and makes your heart stronger.  It helps control your weight and blood pressure; helps you relax and can improve your mood.  Participate in a regular exercise program.  Talk with your doctor about the best form of exercise for you (dancing, walking, swimming, cycling).  DIET/WEIGHT Goal is to maintain a healthy weight  Your discharge diet is:  Diet Order           Diet NPO time specified  Diet effective now          liquids Your height is:  Height: 5\' 9"  (175.3 cm) Your current weight is: Weight: 59 kg (130 lb 1.1 oz) Your Body Mass Index (BMI) is:  BMI (Calculated): 19.2  Following the type  of diet specifically designed for you will help prevent another stroke.  Your goal weight range is:    Your goal Body Mass Index (BMI) is 19-24.  Healthy food habits can help reduce 3 risk factors for stroke:  High cholesterol, hypertension, and excess weight.  RESOURCES Stroke/Support Group:  Call [phone]   STROKE EDUCATION PROVIDED/REVIEWED AND GIVEN TO PATIENT Stroke warning signs and symptoms How to activate emergency medical system (call 911). Medications prescribed at discharge. Need for follow-up after  discharge. Personal risk factors for stroke. Pneumonia vaccine given:  Flu vaccine given:  My questions have been answered, the writing is legible, and I understand these instructions.  I will adhere to these goals & educational materials that have been provided to me after my discharge from the hospital.     My questions have been answered and I understand these instructions. I will adhere to these goals and the provided educational materials after my discharge from the hospital.  Patient/Caregiver Signature _______________________________ Date __________  Clinician Signature _______________________________________ Date __________  Please bring this form and your medication list with you to all your follow-up doctor's appointments.

## 2018-02-06 NOTE — Progress Notes (Addendum)
Physical Therapy Session Note  Patient Details  Name: Adam Yang MRN: [id number] Date of Birth: 1946-05-18  Today's Date: 02/06/2018 PT Co-Treatment Time: 1435-1450 (cotx with SLP for total time of 1435-1505) PT Co-Treatment Time Calculation (min): 15 min  Short Term Goals: Week 1:  PT Short Term Goal 1 (Week 1): Pt will complete bed<>w/c with mod assist +1. PT Short Term Goal 2 (Week 1): Pt will propel w/c 25 ft with min assist. PT Short Term Goal 3 (Week 1): Pt will complete bed mobility with mod assist.  Skilled Therapeutic Interventions/Progress Updates:  Pt seen for co-tx with SLP. Pt received in bed & agreeable to tx. Throughout session pt with behaviors demonstrating pain in abdomen due to new PEG placement & RN & PA made aware. Pt observed to be incontinent of urine & rolled L<>R with mod assist roll R, total assist to roll L with hospital bed features to allow therapists to doff soiled brief, perform peri hygiene, and don clean brief total assist. Therapist's also donned shorts max assist with pt able to raise LLE to place in pants and assisted with pulling shorts over L hips. Pt transfers R sidelying>sitting EOB with max assist and requires max<>total assist for static sitting balance & total assist to don clean shirt. Pt with significant R lateral LOB and when supporting himself with LUE pushes himself further to the right with little ability to correct despite max cuing. Pt transfers bed>w/c on L with +2 assist (pt = <25%) with therapist providing max multimodal cuing for sequencing & hand placement. Transported pt to dayroom where pt unable to attend to midline nor R of midline when engaging in conversation with PT & SLP. At end of session pt left in TIS w/c in handoff to OT.  Vitals during session: SpO2 = 96% on room air HR = 97 bpm BP = 132/101 mmHg (RUE, sitting), rechecked immediately after (RUE, sitting) = 145/87 mmHg --- RN made aware  Therapy Documentation Precautions:   Precautions Precautions: Fall Precaution Comments: R hemi, R inattention Restrictions Weight Bearing Restrictions: No   See Function Navigator for Current Functional Status.   Therapy/Group: Co-Treatment  Waunita Schooner 02/06/2018, 4:08 PM

## 2018-02-06 NOTE — Progress Notes (Signed)
Speech Language Pathology Daily Session Note  Patient Details  Name: Adam Yang MRN: [id number] Date of Birth: 1945-08-23  Today's Date: 02/06/2018 SLP Co-Treatment Time: 1450(Co-tx with PT (VM) [id number])-1505 SLP Co-Treatment Time Calculation (min): 15 min and Today's Date: 02/06/2018 SLP Missed Time: 79 Minutes Missed Time Reason: Patient fatigue  Short Term Goals: Week 1: SLP Short Term Goal 1 (Week 1): Pt will utilize multimodal means to communicate basic wants and needs in 75% of opportunties with Mod A cues.  SLP Short Term Goal 2 (Week 1): Pt answer yes/no questions using multimodal communication in 5 out of 10 opportunities with Max A cues.  SLP Short Term Goal 3 (Week 1): Pt will imitate common sounds/ phrases with multimodal cues and Max A level of support.  SLP Short Term Goal 4 (Week 1): Pt will complete basic problem solving tasks related to ADLs with Min A cues.  SLP Short Term Goal 5 (Week 1): Pt will perform pharyngeal strengthening exercises with Max A cues.  SLP Short Term Goal 6 (Week 1): Given oral stim, pt will initiate timely swallow with Max A cues to perserve function and for participation in dysphagia therpay.   Skilled Therapeutic Interventions: Skilled co-treatment session focused on speech goals. SLP attempted to see patient earlier, however, was unable to maintain arousal due to lethargy from procedure for PEG placement today. Upon arrival, patient was awake and had been incontinent of urine. Patient followed commands during bed mobility,donning pants/shirt and transfer to wheelchair with Max A verbal and tactile cues. Once in the wheelchair, patient with grimacing, labored breathing and rubbing of PEG tube site, RN aware, vitals checked and patient repositioned in the tilt-in-space. Patient answered basic biographical yes/no questions with 50% accuracy and attempted to verbalize at the word and phrase level, however, patient was essentially unintelligible. Suspect  function impacted by pain and fatigue this session. Patient handed off to OT. Continue with current plan of care.      Function:   Cognition Comprehension Comprehension assist level: Understands basic 25 - 49% of the time/ requires cueing 50 - 75% of the time  Expression   Expression assist level: Expresses basic 25 - 49% of the time/requires cueing 50 - 75% of the time. Uses single words/gestures.  Social Interaction Social Interaction assist level: Interacts appropriately 25 - 49% of time - Needs frequent redirection.  Problem Solving Problem solving assist level: Solves basic 25 - 49% of the time - needs direction more than half the time to initiate, plan or complete simple activities  Memory Memory assist level: Recognizes or recalls less than 25% of the time/requires cueing greater than 75% of the time    Pain Pain at PEG placement site, unable to rate. Patient grimacing and rubbing site. RN aware and patient repositioned.   Therapy/Group: Individual Therapy  Yeimi Debnam 02/06/2018, 3:14 PM

## 2018-02-06 NOTE — Progress Notes (Signed)
Physical Therapy Session Note  Patient Details  Name: Adam Yang MRN: [id number] Date of Birth: 14-Oct-1945  Today's Date: 02/06/2018 PT Individual Time: 0807-0905 PT Individual Time Calculation (min): 58 min  Short Term Goals: Week 1:  PT Short Term Goal 1 (Week 1): Pt will complete bed<>w/c with mod assist +1. PT Short Term Goal 2 (Week 1): Pt will propel w/c 25 ft with min assist. PT Short Term Goal 3 (Week 1): Pt will complete bed mobility with mod assist.  Skilled Therapeutic Interventions/Progress Updates:  Pt received in bed, no behaviors demonstrating pain during session. Pt performed rolling in bed, mod assist roll R & total assist to roll L with therapist providing max multimodal cuing for postioning, placement and assistance with initiation & execution of task. Therapist provided dependent assist to don shorts & shirt bed level. Positioned LE to allow pt to assist with scooting towards EOB but pt unable to follow commands to utilize LLE to move RLE. Pt requires max assist for R side lying to sitting EOB. In sitting (EOB or EOM table) pt demonstrates significant R lateral lean due to weak musculature & impaired neuromuscular control, which is enhanced by pt pushing R with LUE with little ability to correct pt despite placing LUE in lap. Pt completes bed<>w/c and w/c<>mat table via squat pivot with +2 assist (pt = <25% effort) and very little ability to follow commands to assist with transfer. Sitting EOM in gym pt engaged in reaching for objects on L with task focusing on balance and to attempt to reorient pt to midline. Pt with behaviors demonstrating task is a great effort for him and requires heavy cuing for sustained attention to task. In dayroom, transitioned to pt following one step commands and scanning to midline or R of midline with pt not engaging in task despite extra time & cuing. At end of session pt left in bed with alarm set & all needs within reach.   Therapy  Documentation Precautions:  Precautions Precautions: Fall Precaution Comments: R hemi, R inattention Restrictions Weight Bearing Restrictions: No   See Function Navigator for Current Functional Status.   Therapy/Group: Individual Therapy  Waunita Schooner 02/06/2018, 4:08 PM

## 2018-02-07 ENCOUNTER — Inpatient Hospital Stay (HOSPITAL_COMMUNITY): Payer: Medicare Other | Admitting: Physical Therapy

## 2018-02-07 ENCOUNTER — Inpatient Hospital Stay (HOSPITAL_COMMUNITY): Payer: Medicare Other

## 2018-02-07 ENCOUNTER — Inpatient Hospital Stay (HOSPITAL_COMMUNITY): Payer: Medicare Other | Admitting: Occupational Therapy

## 2018-02-07 LAB — CBC
HEMATOCRIT: 41.2 % (ref 39.0–52.0)
Hemoglobin: 12.4 g/dL — ABNORMAL LOW (ref 13.0–17.0)
MCH: 26 pg (ref 26.0–34.0)
MCHC: 30.1 g/dL (ref 30.0–36.0)
MCV: 86.4 fL (ref 78.0–100.0)
Platelets: 236 10*3/uL (ref 150–400)
RBC: 4.77 MIL/uL (ref 4.22–5.81)
RDW: 15.5 % (ref 11.5–15.5)
WBC: 10.7 10*3/uL — AB (ref 4.0–10.5)

## 2018-02-07 LAB — BASIC METABOLIC PANEL
ANION GAP: 9 (ref 5–15)
BUN: 63 mg/dL — AB (ref 8–23)
CO2: 24 mmol/L (ref 22–32)
Calcium: 8.9 mg/dL (ref 8.9–10.3)
Chloride: 114 mmol/L — ABNORMAL HIGH (ref 98–111)
Creatinine, Ser: 2.03 mg/dL — ABNORMAL HIGH (ref 0.61–1.24)
GFR calc Af Amer: 37 mL/min — ABNORMAL LOW (ref >60)
GFR, EST NON AFRICAN AMERICAN: 32 mL/min — AB (ref >60)
Glucose, Bld: 113 mg/dL — ABNORMAL HIGH (ref 70–99)
POTASSIUM: 4.6 mmol/L (ref 3.5–5.1)
SODIUM: 147 mmol/L — AB (ref 135–145)

## 2018-02-07 NOTE — Progress Notes (Signed)
Occupational Therapy Session Note  Patient Details  Name: Adam Yang MRN: [id number] Date of Birth: [birth year]  Today's Date: 02/07/2018 OT Individual Time: 1305-1400 OT Individual Time Calculation (min): 55 min    Short Term Goals: Week 1:  OT Short Term Goal 1 (Week 1): Pt will complete sit > stand with mod assist to decrease burden of care with LB bathing/dressing OT Short Term Goal 2 (Week 1): Pt will complete bathing with max assist with mod cues for sequencing OT Short Term Goal 3 (Week 1): Pt will complete UB dressing with max assist OT Short Term Goal 4 (Week 1): Pt will complete toilet transfer with max assist of 1 caregiver  Skilled Therapeutic Interventions/Progress Updates:    Treatment session with focus on attention to task, following one step commands, and visual scanning.  Pt received supine in bed easily awakened.  Engaged in bed mobility to get OOB when found pt to be incontinent of bladder.  Engaged in [place] as well as bridging to doff shorts.  Pt able to initiate bridging once therapist assisted with positioning BLE in bent position.  Donned clean pants in supine with pt again bridging to allow therapist to pull pants over hips.  Engaged in visual scanning and attention task in chair position in bed with use of LARK box of familiar objects.  Pt unable to visually scan 3-4 items on table with attempts to have pt point to or visually scan to item identified (pen, spoon, knife, toothbrush).  Provided hand over hand to demonstrate object of activity with pt able to pick Lt arm up and put it on tray table but then would return it to his lap without making any attempt to reach towards any objects.  Pt returned to semi-reclined and left in bed with all needs in reach.    Utilized pt preferred music during activity to attempt to promote arousal and attention to task.  Therapy Documentation Precautions:  Precautions Precautions: Fall Precaution Comments: R hemi, R  inattention Restrictions Weight Bearing Restrictions: No General:   Vital Signs:  Pain: Pain Assessment Pain Score: 0-No pain  See Function Navigator for Current Functional Status.   Therapy/Group: Individual Therapy  Simonne Come 02/07/2018, 2:43 PM

## 2018-02-07 NOTE — Progress Notes (Signed)
Patient ID: Adam Yang, male   DOB: April 08, 1946, 71 y.o.   MRN: [id number]   Perc Gastric tube placed in IR yesterday  RN reports skin is without redness Not pain per pt No bleeding Afeb  May use now

## 2018-02-07 NOTE — Progress Notes (Signed)
Physical Therapy Session Note  Patient Details  Name: Adam Yang MRN: [id number] Date of Birth: 1945/07/22  Today's Date: 02/07/2018 PT Individual Time: 1435-1515 PT Individual Time Calculation (min): 40 min   Short Term Goals: Week 1:  PT Short Term Goal 1 (Week 1): Pt will complete bed<>w/c with mod assist +1. PT Short Term Goal 2 (Week 1): Pt will propel w/c 25 ft with min assist. PT Short Term Goal 3 (Week 1): Pt will complete bed mobility with mod assist.  Skilled Therapeutic Interventions/Progress Updates: Pt presented in bed sleeping but easily aroused. Pt nodding head in agreement to therapy. Pt required maxA supine to sit with pt able to follow single step commands to placement of LLE and HOH assist for placement of LUE to bed rail. Pt required maxA for truncal support and to stabilize in midline. Pt used LUE on bed rail for support and PTA was able to cue for placement of LUE on bed for increased core recruitment. Pt encouraged to track to R requiring max cues as pt's church friend arrived. Pt participated in reaching activities with LUE requiring mod/max cues to attention to task. Pt would intermittently use LUE to stabilize and sitting balance mostly maxA with intermittent episodes of modA. Pt then indicating return to sitting. Pt was total assist sit to supine and total assist x 2 for boosting to [facility] and repositioning. Pt then participated in supine therex with RLE including AA SLR, AA hip abd/add, heel slides x 10 and attempted glute sets however pt becoming increasingly fatigued and unable to follow command after 3rd repetition. Pt left with bed alarm on, and friend Adam Yang present.       Therapy Documentation Precautions:  Precautions Precautions: Fall Precaution Comments: R hemi, R inattention Restrictions Weight Bearing Restrictions: No General:   Vital Signs: Therapy Vitals Pulse Rate: 89 BP: (!) 159/87 Patient Position (if appropriate): Lying Oxygen Therapy SpO2: 93  % O2 Device: Room Air Pain: Pain Assessment Pain Score: 0-No pain   See Function Navigator for Current Functional Status.   Therapy/Group: Individual Therapy  Adam Yang  Adam Yang, PTA  02/07/2018, 3:57 PM

## 2018-02-07 NOTE — Progress Notes (Signed)
Speech Language Pathology Daily Session Note  Patient Details  Name: Adam Yang MRN: [id number] Date of Birth: 08-12-1945  Today's Date: 02/07/2018 SLP Individual Time: 1000-1052 SLP Individual Time Calculation (min): 52 min  Short Term Goals: Week 1: SLP Short Term Goal 1 (Week 1): Pt will utilize multimodal means to communicate basic wants and needs in 75% of opportunties with Mod A cues.  SLP Short Term Goal 2 (Week 1): Pt answer yes/no questions using multimodal communication in 5 out of 10 opportunities with Max A cues.  SLP Short Term Goal 3 (Week 1): Pt will imitate common sounds/ phrases with multimodal cues and Max A level of support.  SLP Short Term Goal 4 (Week 1): Pt will complete basic problem solving tasks related to ADLs with Min A cues.  SLP Short Term Goal 5 (Week 1): Pt will perform pharyngeal strengthening exercises with Max A cues.  SLP Short Term Goal 6 (Week 1): Given oral stim, pt will initiate timely swallow with Max A cues to perserve function and for participation in dysphagia therpay.   Skilled Therapeutic Interventions:Skilled ST services focused on swallow and speech skills. SLP facilitated swallow initiation by providing oral care, pt had large amounts of, questioning dried secretions verse thrush, on tongue. SLP attempted to remove, however oral care ended due to express pain by pt. SLP notified nurse about lingual residue. SLP facilitated communication utilizing yes/no questions pertaining to biographical/immediate information with 50% accuracy Max A verbal/visual cues, and 30% accuracy of identification of objects Max A verbal/visual cues. Pt attempted vocalizations, however unintelligible. Pt re quried max A verbal and tactile cues for sustained attention. Pt indicated in response to yes/no questions he wanted to get in bed. SLP and NT transferred pt into bed via maxi move, pt demonstrated ability to follow 1 step directions with min A verbal/visual cues. Pt was  left in room with call bell within reach and bed alaram set.ST reccomends to continue skilled ST services.     Function:  Eating Eating                 Cognition Comprehension Comprehension assist level: Understands basic 25 - 49% of the time/ requires cueing 50 - 75% of the time  Expression   Expression assist level: Expresses basis less than 25% of the time/requires cueing >75% of the time.  Social Interaction Social Interaction assist level: Interacts appropriately 25 - 49% of time - Needs frequent redirection.;Interacts appropriately less than 25% of the time. May be withdrawn or combative.  Problem Solving Problem solving assist level: Solves basic 25 - 49% of the time - needs direction more than half the time to initiate, plan or complete simple activities  Memory Memory assist level: Recognizes or recalls 25 - 49% of the time/requires cueing 50 - 75% of the time    Pain Pain Assessment Pain Score: 0-No pain  Therapy/Group: Individual Therapy  Juanette Urizar  [facility] 02/07/2018, 2:27 PM

## 2018-02-07 NOTE — Progress Notes (Addendum)
Physical Therapy Session Note  Patient Details  Name: Adam Yang MRN: [id number] Date of Birth: 12-27-1945  Today's Date: 02/07/2018 PT Individual Time: 0807-0904  PT Individual Time Calculation (min): 57 min  Short Term Goals: Week 1:  PT Short Term Goal 1 (Week 1): Pt will complete bed<>w/c with mod assist +1. PT Short Term Goal 2 (Week 1): Pt will propel w/c 25 ft with min assist. PT Short Term Goal 3 (Week 1): Pt will complete bed mobility with mod assist.  Skilled Therapeutic Interventions/Progress Updates:  Pt received in bed & with behaviors demonstrating pain while in standing frame & rest breaks taken PRN & RN made aware. Pt performed rolling in bed with bed rails (total assist roll L, max assist roll R) and manual facilitation for UE/LE placement and sequencing to allow therapists to don shorts total assist. Pt transferred R sidelying>sitting EOB with total assist and bed>w/c via squat pivot with +2 assist. Transported pt to gym via w/c total assist and utilized standing frame for 3 bouts for only a few minutes at a time with task focusing on BLE weight bearing for R NMR, core strengthening and to address midline orientation. Pt continues to push R with LLE and LUE and unable to correct posture even with cuing to shift shoulders or use of mirror for visual feedback. Pt with behaviors of feeling unwell in standing frame & vitals assessed in RUE (sitting BP = 151/76 mmHg, HR = 87 bpm, standing BP = 129/71 mmHg, HR = 92 bpm). Pt also with little to no engagement in activating core/trunk as pt would lean back in standing frame despite encouragement to attempt to hold himself up. Transitioned to table top task attempting to have pt follow one step commands but pt occasionally shaking head no and closing eyes. Utilized music to increase pt's alertness and to track R of midline but pt only able to so briefly one time due to limited engagement. Pt left sitting in TIS w/c at nurses station with  alarm belt donned in attempts to increase pt's alertness prior to next therapy session.   Therapy Documentation Precautions:  Precautions Precautions: Fall Precaution Comments: R hemi, R inattention Restrictions Weight Bearing Restrictions: No General: PT Amount of Missed Time (min): 18 Minutes PT Missed Treatment Reason: (lethargy)   See Function Navigator for Current Functional Status.   Therapy/Group: Individual Therapy  Waunita Schooner 02/07/2018, 9:14 AM

## 2018-02-07 NOTE — Progress Notes (Signed)
Subjective/Complaints: Pt in bed. No new issues. Slow to arouse this morning  ROS: limited due to language/communication .   Objective: Vital Signs: Blood pressure (!) 159/96, pulse 81, temperature (!) 97.5 F (36.4 C), temperature source Oral, resp. rate 18, height '5\' 9"'$  (1.753 m), weight 59 kg (130 lb 1.1 oz), SpO2 96 %. Ir Gastrostomy Tube Mod Sed  Result Date: 02/06/2018 INDICATION: 72 year old with intracranial hemorrhage and dysphagia. Patient needs a percutaneous gastrostomy tube for nutrition. EXAM: PERCUTANEOUS GASTROSTOMY TUBE WITH FLUOROSCOPIC GUIDANCE Physician: Stephan Minister. Henn, MD MEDICATIONS: Ancef 1 g; Antibiotics were administered within 1 hour of the procedure. ANESTHESIA/SEDATION: Versed 3 mg IV; Fentanyl 100 mcg IV Moderate Sedation Time:  15 minutes The patient was continuously monitored during the procedure by the interventional radiology nurse under my direct supervision. FLUOROSCOPY TIME:  Fluoroscopy Time: 4 minutes 6 seconds (6 mGy). COMPLICATIONS: None immediate. PROCEDURE: The procedure was explained to the patient's wife. The risks and benefits of the procedure were discussed and the patient's wife's questions were addressed. Informed consent was obtained from the patient. The patient was placed on the interventional table. An orogastric tube was placed with fluoroscopic guidance. The anterior abdomen was prepped and draped in sterile fashion. Maximal barrier sterile technique was utilized including caps, mask, sterile gowns, sterile gloves, sterile drape, hand hygiene and skin antiseptic. Stomach was inflated with air through the orogastric tube. The skin and subcutaneous tissues were anesthetized with 1% lidocaine. A 17 gauge needle was directed into the distended stomach with fluoroscopic guidance. A wire was advanced into the stomach and a T-tact was deployed. A 9-French vascular sheath was placed and the orogastric tube was snared using a Goos[place]ck snare device. The  orogastric tube and snare were pulled out of the patient's mouth. The snare device was connected to a 20-French gastrostomy tube. The snare device and gastrostomy tube were pulled through the patient's mouth and out the anterior abdominal wall. The gastrostomy tube was cut to an appropriate length. Contrast injection through gastrostomy tube confirmed placement within the stomach. Fluoroscopic images were obtained for documentation. The gastrostomy tube was flushed with normal saline. IMPRESSION: Successful fluoroscopic guided percutaneous gastrostomy tube placement. Electronically Signed   By: Markus Daft M.D.   On: 02/06/2018 12:33   Results for orders placed or performed during the hospital encounter of 02/02/18 (from the past 72 hour(s))  Glucose, capillary     Status: Abnormal   Collection Time: 02/04/18  4:14 PM  Result Value Ref Range   Glucose-Capillary 148 (H) 70 - 99 mg/dL   Comment 1 Document in Chart   Glucose, capillary     Status: Abnormal   Collection Time: 02/04/18  8:15 PM  Result Value Ref Range   Glucose-Capillary 123 (H) 70 - 99 mg/dL   Comment 1 Notify RN   Glucose, capillary     Status: Abnormal   Collection Time: 02/05/18  9:10 AM  Result Value Ref Range   Glucose-Capillary 142 (H) 70 - 99 mg/dL  Glucose, capillary     Status: Abnormal   Collection Time: 02/05/18 11:34 AM  Result Value Ref Range   Glucose-Capillary 164 (H) 70 - 99 mg/dL  Glucose, capillary     Status: Abnormal   Collection Time: 02/05/18  4:49 PM  Result Value Ref Range   Glucose-Capillary 128 (H) 70 - 99 mg/dL  Glucose, capillary     Status: Abnormal   Collection Time: 02/05/18  8:10 PM  Result Value Ref Range   Glucose-Capillary  110 (H) 70 - 99 mg/dL  Glucose, capillary     Status: Abnormal   Collection Time: 02/05/18 11:46 PM  Result Value Ref Range   Glucose-Capillary 159 (H) 70 - 99 mg/dL  Glucose, capillary     Status: Abnormal   Collection Time: 02/06/18  4:07 AM  Result Value Ref Range    Glucose-Capillary 142 (H) 70 - 99 mg/dL  Glucose, capillary     Status: Abnormal   Collection Time: 02/06/18  8:11 AM  Result Value Ref Range   Glucose-Capillary 137 (H) 70 - 99 mg/dL  Glucose, capillary     Status: Abnormal   Collection Time: 02/06/18 11:32 AM  Result Value Ref Range   Glucose-Capillary 120 (H) 70 - 99 mg/dL  Comprehensive metabolic panel     Status: Abnormal   Collection Time: 02/06/18 12:10 PM  Result Value Ref Range   Sodium 152 (H) 135 - 145 mmol/L   Potassium 4.3 3.5 - 5.1 mmol/L   Chloride 115 (H) 98 - 111 mmol/L   CO2 28 22 - 32 mmol/L   Glucose, Bld 152 (H) 70 - 99 mg/dL   BUN 71 (H) 8 - 23 mg/dL   Creatinine, Ser 2.30 (H) 0.61 - 1.24 mg/dL   Calcium 9.3 8.9 - 10.3 mg/dL   Total Protein 6.8 6.5 - 8.1 g/dL   Albumin 3.2 (L) 3.5 - 5.0 g/dL   AST 23 15 - 41 U/L   ALT 55 (H) 0 - 44 U/L   Alkaline Phosphatase 50 38 - 126 U/L   Total Bilirubin 0.5 0.3 - 1.2 mg/dL   GFR calc non Af Amer 27 (L) >60 mL/min   GFR calc Af Amer 31 (L) >60 mL/min    Comment: (NOTE) The eGFR has been calculated using the CKD EPI equation. This calculation has not been validated in all clinical situations. eGFR's persistently <60 mL/min signify possible Chronic Kidney Disease.    Anion gap 9 5 - 15    Comment: Performed at [facility] [address], [place], [place] [address]  Glucose, capillary     Status: Abnormal   Collection Time: 02/06/18  4:23 PM  Result Value Ref Range   Glucose-Capillary 144 (H) 70 - 99 mg/dL  Glucose, capillary     Status: Abnormal   Collection Time: 02/06/18  7:58 PM  Result Value Ref Range   Glucose-Capillary 139 (H) 70 - 99 mg/dL  Basic metabolic panel     Status: Abnormal   Collection Time: 02/07/18  5:57 AM  Result Value Ref Range   Sodium 147 (H) 135 - 145 mmol/L   Potassium 4.6 3.5 - 5.1 mmol/L    Comment: HEMOLYSIS AT THIS LEVEL MAY AFFECT RESULT   Chloride 114 (H) 98 - 111 mmol/L   CO2 24 22 - 32 mmol/L   Glucose, Bld 113  (H) 70 - 99 mg/dL   BUN 63 (H) 8 - 23 mg/dL   Creatinine, Ser 2.03 (H) 0.61 - 1.24 mg/dL   Calcium 8.9 8.9 - 10.3 mg/dL   GFR calc non Af Amer 32 (L) >60 mL/min   GFR calc Af Amer 37 (L) >60 mL/min    Comment: (NOTE) The eGFR has been calculated using the CKD EPI equation. This calculation has not been validated in all clinical situations. eGFR's persistently <60 mL/min signify possible Chronic Kidney Disease.    Anion gap 9 5 - 15    Comment: Performed at [facility]  [address], [place], [place] [address]  CBC     Status: Abnormal   Collection Time: 02/07/18  5:57 AM  Result Value Ref Range   WBC 10.7 (H) 4.0 - 10.5 K/uL   RBC 4.77 4.22 - 5.81 MIL/uL   Hemoglobin 12.4 (L) 13.0 - 17.0 g/dL   HCT 41.2 39.0 - 52.0 %   MCV 86.4 78.0 - 100.0 fL   MCH 26.0 26.0 - 34.0 pg   MCHC 30.1 30.0 - 36.0 g/dL   RDW 15.5 11.5 - 15.5 %   Platelets 236 150 - 400 K/uL    Comment: Performed at [facility], [place] [address], [place], [place] [address]    Constitutional: No distress . Vital signs reviewed. HEENT: EOMI, oral membranes moist Neck: supple Cardiovascular: RRR without murmur. No JVD    Respiratory: CTA Bilaterally without wheezes or rales. Normal effort    GI: BS +, non-tender, non-distended   Skin:   Intact. Warm and dry. Neuro: slow to arouse Motor: limited due to aphasia, 0/5 Right side Musc/Skel:  No edema or tenderness in extremities Psych: flat  Assessment/Plan: 1. 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 Physiatrist is providing close team supervision and 24 hour management of active medical problems listed below. Physiatrist and rehab team continue to assess barriers to discharge/monitor patient progress toward functional and medical goals. FIM: Function -  Bathing Position: Wheelchair/chair at sink Body parts bathed by patient: Chest, Abdomen Body parts bathed by helper: Right arm, Left arm, Front perineal area, Buttocks, Right upper leg, Left upper leg, Right lower leg, Left lower leg, Back Assist Level: 2 helpers  Function- Upper Body Dressing/Undressing What is the patient wearing?: Button up shirt Button up shirt - Perfomed by helper: Thread/unthread right sleeve, Thread/unthread left sleeve, Button/unbutton shirt, Pull shirt around back Assist Level: (Total assist) Function - Lower Body Dressing/Undressing What is the patient wearing?: Pants, Non-skid slipper socks Position: Wheelchair/chair at sink Pants- Performed by helper: Thread/unthread right pants leg, Thread/unthread left pants leg, Pull pants up/down Non-skid slipper socks- Performed by helper: Don/doff right sock, Don/doff left sock Assist for footwear: Dependant Assist for lower body dressing: 2 Helpers  Function - Toileting Toileting activity did not occur: No continent bowel/bladder event Assist level: Two helpers  Function - Air cabin crew transfer activity did not occur: Safety/medical concerns  Function - Chair/bed transfer Chair/bed transfer method: Squat pivot Chair/bed transfer assist level: 2 helpers Chair/bed transfer assistive device: Armrests Mechanical lift: Stedy Chair/bed transfer details: Tactile cues for initiation, Tactile cues for weight shifting, Tactile cues for posture, Tactile cues for sequencing, Tactile cues for placement, Verbal cues for technique, Verbal cues for sequencing, Manual facilitation for weight shifting, Manual facilitation for placement  Function - Locomotion: Wheelchair Will patient use wheelchair at discharge?: Yes Wheelchair activity did not occur: Safety/medical concerns Assist Level: Dependent (Pt equals 0%) Wheel 50 feet with 2 turns activity did not occur: Safety/medical concerns Wheel 150 feet activity did not  occur: Safety/medical concerns Turns around,maneuvers to table,bed, and toilet,negotiates 3% grade,maneuvers on rugs and over doorsills: No Function - Locomotion: Ambulation Assistive device: (3 muskateers + w/c follow) Max distance: 3 ft Assist level: 2 helpers Walk 10 feet activity did not occur: Safety/medical concerns Walk 50 feet with 2 turns activity did not occur: Safety/medical concerns Walk 150 feet activity did not occur:  Safety/medical concerns Walk 10 feet on uneven surfaces activity did not occur: Safety/medical concerns  Function - Comprehension Comprehension: Auditory Comprehension assist level: Understands basic 25 - 49% of the time/ requires cueing 50 - 75% of the time  Function - Expression Expression: Verbal Expression assist level: Expresses basis less than 25% of the time/requires cueing >75% of the time.  Function - Social Interaction Social Interaction assist level: Interacts appropriately 25 - 49% of time - Needs frequent redirection.  Function - Problem Solving Problem solving assist level: Solves basic 25 - 49% of the time - needs direction more than half the time to initiate, plan or complete simple activities  Function - Memory Memory assist level: Recognizes or recalls 25 - 49% of the time/requires cueing 50 - 75% of the time Patient normally able to recall (first 3 days only): None of the above  Medical Problem List and Plan: 1.Right side weakness with facial droop and dysphasiasecondary to large left frontoparietal hemorrhagic infarction with vasogenic edema and mass-effect  Continue CIR 2. DVT Prophylaxis/Anticoagulation: SCDs. Monitor for any signs of DVT 3. Pain Management:Zanaflex 4 mg nightly 4. Mood:Provide emotional support 5. Neuropsych: This patientis notcapable of making decisions on hisown behalf. 6. Skin/Wound Care:Routine skin checks 7. Fluids/Electrolytes/Nutrition:Routine in and outs 8.Dysphagia: PEG placed today per  IR  -follow for healing 9.Hypertension.   Lisinopril 20 mg daily, Norvasc 10 mg daily. Monitor with increased mobility  Fair control 7/29 10.Diabetes mellitus. Hemoglobin A1c 6.0. SSI. 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 12.Hyperlipidemia. Fenofibrate. 13. Hypernatremia  Sodium 152 today  Free water flushes increased   Serial labs 14. AKI on CKD  BUN/Creatinine with some improvement  -serial labs  -began IVF last night.   -H2O flushes. Dc IVF perhaps tomorrow based on labs  15. Anemia of chronic disease  Hemoglobin 12.4 on 7/30  Cont to monitor 6. Transaminitis  resolved  LOS (Days) 5 A FACE TO FACE EVALUATION WAS PERFORMED  Meredith Staggers 02/07/2018, 1:28 PM

## 2018-02-08 ENCOUNTER — Inpatient Hospital Stay (HOSPITAL_COMMUNITY): Payer: Medicare Other

## 2018-02-08 ENCOUNTER — Inpatient Hospital Stay (HOSPITAL_COMMUNITY): Payer: Medicare Other | Admitting: *Deleted

## 2018-02-08 ENCOUNTER — Inpatient Hospital Stay (HOSPITAL_COMMUNITY): Payer: Medicare Other | Admitting: Occupational Therapy

## 2018-02-08 ENCOUNTER — Inpatient Hospital Stay (HOSPITAL_COMMUNITY): Payer: Medicare Other | Admitting: Speech Pathology

## 2018-02-08 ENCOUNTER — Inpatient Hospital Stay (HOSPITAL_COMMUNITY): Payer: Medicare Other | Admitting: Physical Therapy

## 2018-02-08 LAB — BASIC METABOLIC PANEL
ANION GAP: 7 (ref 5–15)
BUN: 48 mg/dL — ABNORMAL HIGH (ref 8–23)
CHLORIDE: 109 mmol/L (ref 98–111)
CO2: 26 mmol/L (ref 22–32)
Calcium: 8.4 mg/dL — ABNORMAL LOW (ref 8.9–10.3)
Creatinine, Ser: 1.75 mg/dL — ABNORMAL HIGH (ref 0.61–1.24)
GFR calc Af Amer: 44 mL/min — ABNORMAL LOW (ref >60)
GFR, EST NON AFRICAN AMERICAN: 38 mL/min — AB (ref >60)
Glucose, Bld: 163 mg/dL — ABNORMAL HIGH (ref 70–99)
POTASSIUM: 3.7 mmol/L (ref 3.5–5.1)
Sodium: 142 mmol/L (ref 135–145)

## 2018-02-08 NOTE — Progress Notes (Signed)
Occupational Therapy Session Note  Patient Details  Name: Adam Yang MRN: [id number] Date of Birth: Nov 30, 1945  Today's Date: 02/08/2018 OT Individual Time: 1100-1120 OT Individual Time Calculation (min): 20 min  and Today's Date: 02/08/2018 OT Missed Time: 10 Minutes Missed Time Reason: Patient fatigue   Short Term Goals: Week 1:  OT Short Term Goal 1 (Week 1): Pt will complete sit > stand with mod assist to decrease burden of care with LB bathing/dressing OT Short Term Goal 2 (Week 1): Pt will complete bathing with max assist with mod cues for sequencing OT Short Term Goal 3 (Week 1): Pt will complete UB dressing with max assist OT Short Term Goal 4 (Week 1): Pt will complete toilet transfer with max assist of 1 caregiver  Skilled Therapeutic Interventions/Progress Updates:    Pt received supine in bed, with nursing reporting high levels of fatigue from pt. Attempted to engage pt in music selection task, using pt's tablet and familiar artists of choice. Pt opened eyes briefly to attend to task before dozing off back to sleep. Total A to perform side lying to attempt and bring pt EOB but pt not participating and was therefore returned to supine. After more multimodal cueing to increase pt arousal, pt was left supine with bed alarm set and all needs met. 10 min missed.   Therapy Documentation Precautions:  Precautions Precautions: Fall Precaution Comments: R hemi, R inattention Restrictions Weight Bearing Restrictions: No General: General OT Amount of Missed Time: 10 Minutes PT Missed Treatment Reason: Patient fatigue  Pain: Pain Assessment Pain Score: 0-No pain  See Function Navigator for Current Functional Status.   Therapy/Group: Individual Therapy  Curtis Sites 02/08/2018, 2:05 PM

## 2018-02-08 NOTE — Progress Notes (Signed)
Occupational Therapy Note  Patient Details  Name: Adam Yang MRN: [id number] Date of Birth: Jun 16, 1946  Today's Date: 02/08/2018 OT Missed Time: 56 Minutes Missed Time Reason: Patient fatigue  Pt missed 30 mins skilled OT services.  Unable to arouse pt adequately to actively participate in therapy.   Leotis [facility] 02/08/2018, 11:51 AM

## 2018-02-08 NOTE — Progress Notes (Signed)
Occupational Therapy Note  Patient Details  Name: Adam Yang MRN: [id number] Date of Birth: [birth year]  Pt's plan of care adjusted to 15/7 after speaking with care team and discussed with PA Linna Hoff Angiulli) as pt currently unable to tolerate current therapy schedule with OT, PT, and SLP.     Simonne Come 02/08/2018, 8:41 AM

## 2018-02-08 NOTE — Progress Notes (Signed)
Speech Language Pathology Daily Make-up Session Note  Patient Details  Name: Adam Yang MRN: [id number] Date of Birth: 1945/12/30  Today's Date: 02/08/2018 SLP Individual Time: 1030-1045 SLP Individual Time Calculation (min): 15 min and Today's Date: 02/08/2018 SLP Missed Time: 15 Minutes Missed Time Reason: Patient fatigue  Short Term Goals: Week 1: SLP Short Term Goal 1 (Week 1): Pt will utilize multimodal means to communicate basic wants and needs in 75% of opportunties with Mod A cues.  SLP Short Term Goal 2 (Week 1): Pt answer yes/no questions using multimodal communication in 5 out of 10 opportunities with Max A cues.  SLP Short Term Goal 3 (Week 1): Pt will imitate common sounds/ phrases with multimodal cues and Max A level of support.  SLP Short Term Goal 4 (Week 1): Pt will complete basic problem solving tasks related to ADLs with Min A cues.  SLP Short Term Goal 5 (Week 1): Pt will perform pharyngeal strengthening exercises with Max A cues.  SLP Short Term Goal 6 (Week 1): Given oral stim, pt will initiate timely swallow with Max A cues to perserve function and for participation in dysphagia therpay.   Skilled Therapeutic Interventions: Skilled treatment session focused on cognitive-linguistic goals. Upon arrival, patient was asleep while supine in bed. Despite Max A multimodal cues, patient was unable to maintain arousal for more than 20 second intervals. Patient also noted with non-purposeful, intermittnet moaning. Patient missed 15 minutes of skilled SLP make-up time due to lethargy. Patient left upright in bed with alarm on and all needs within reach. Continue with current plan of care.      Function:   Cognition Comprehension Comprehension assist level: Understands basic 25 - 49% of the time/ requires cueing 50 - 75% of the time  Expression   Expression assist level: Expresses basic 25 - 49% of the time/requires cueing 50 - 75% of the time. Uses single words/gestures.   Social Interaction Social Interaction assist level: Interacts appropriately 25 - 49% of time - Needs frequent redirection.  Problem Solving Problem solving assist level: Solves basic 25 - 49% of the time - needs direction more than half the time to initiate, plan or complete simple activities  Memory Memory assist level: Recognizes or recalls 25 - 49% of the time/requires cueing 50 - 75% of the time    Pain No/Denies Pain   Therapy/Group: Individual Therapy  Ronit Marczak 02/08/2018, 10:52 AM

## 2018-02-08 NOTE — Progress Notes (Signed)
Occupational Therapy Session Note  Patient Details  Name: Adam Yang MRN: [id number] Date of Birth: 09/08/1945  Today's Date: 02/08/2018 OT Individual Time: 7062-3762 OT Individual Time Calculation (min): 60 min    Short Term Goals: Week 1:  OT Short Term Goal 1 (Week 1): Pt will complete sit > stand with mod assist to decrease burden of care with LB bathing/dressing OT Short Term Goal 2 (Week 1): Pt will complete bathing with max assist with mod cues for sequencing OT Short Term Goal 3 (Week 1): Pt will complete UB dressing with max assist OT Short Term Goal 4 (Week 1): Pt will complete toilet transfer with max assist of 1 caregiver  Skilled Therapeutic Interventions/Progress Updates:    Treatment session with focus on initiation and participation during self-care tasks.  Pt received sound asleep in bed requiring increased time and modification of environment to arouse.  Therapist turned on lights, elevated HOB, and provided wash cloth to face and sternal rubs to arouse.  Pt opening eyes but would return to eyes closed frequently throughout session.  Engaged in LB bathing/dressing at bed level with focus on following one step commands to assist with rolling, requiring +2 for rolling.  Pt able to bridge to pull pants over hips with pt initiating pulling pants over Lt hip.  +2 for supine to sitting at EOB due to pushing to Rt during transitional movement and then requiring max assist to maintain sitting balance at EOB due to pushing.  Completed UB bathing and dressing with +2 assist due to heavy pushing tendencies and decreased attention to task to engage in dressing.  Completed squat pivot transfer to w/c with +2, however pushing was absent during transfer.  Left reclined in w/c with safety belt on for positioning and left seated at RN station for arousal and supervision.  Therapy Documentation Precautions:  Precautions Precautions: Fall Precaution Comments: R hemi, R  inattention Restrictions Weight Bearing Restrictions: No Pain:  Pt with no c/o pain, no grimacing this session  See Function Navigator for Current Functional Status.   Therapy/Group: Individual Therapy  Simonne Come 02/08/2018, 10:21 AM

## 2018-02-08 NOTE — Progress Notes (Signed)
Subjective/Complaints: No new problems overnight.   ROS: limited due to language/communication    Objective: Vital Signs: Blood pressure (!) 160/74, pulse 94, temperature 99.1 F (37.3 C), temperature source Oral, resp. rate 18, height '5\' 9"'$  (1.753 m), weight 59.2 kg (130 lb 8.7 oz), SpO2 95 %. Ir Gastrostomy Tube Mod Sed  Result Date: 02/06/2018 INDICATION: 72 year old with intracranial hemorrhage and dysphagia. Patient needs a percutaneous gastrostomy tube for nutrition. EXAM: PERCUTANEOUS GASTROSTOMY TUBE WITH FLUOROSCOPIC GUIDANCE Physician: Stephan Minister. Henn, MD MEDICATIONS: Ancef 1 g; Antibiotics were administered within 1 hour of the procedure. ANESTHESIA/SEDATION: Versed 3 mg IV; Fentanyl 100 mcg IV Moderate Sedation Time:  15 minutes The patient was continuously monitored during the procedure by the interventional radiology nurse under my direct supervision. FLUOROSCOPY TIME:  Fluoroscopy Time: 4 minutes 6 seconds (6 mGy). COMPLICATIONS: None immediate. PROCEDURE: The procedure was explained to the patient's wife. The risks and benefits of the procedure were discussed and the patient's wife's questions were addressed. Informed consent was obtained from the patient. The patient was placed on the interventional table. An orogastric tube was placed with fluoroscopic guidance. The anterior abdomen was prepped and draped in sterile fashion. Maximal barrier sterile technique was utilized including caps, mask, sterile gowns, sterile gloves, sterile drape, hand hygiene and skin antiseptic. Stomach was inflated with air through the orogastric tube. The skin and subcutaneous tissues were anesthetized with 1% lidocaine. A 17 gauge needle was directed into the distended stomach with fluoroscopic guidance. A wire was advanced into the stomach and a T-tact was deployed. A 9-French vascular sheath was placed and the orogastric tube was snared using a [place] snare device. The orogastric tube and snare were  pulled out of the patient's mouth. The snare device was connected to a 20-French gastrostomy tube. The snare device and gastrostomy tube were pulled through the patient's mouth and out the anterior abdominal wall. The gastrostomy tube was cut to an appropriate length. Contrast injection through gastrostomy tube confirmed placement within the stomach. Fluoroscopic images were obtained for documentation. The gastrostomy tube was flushed with normal saline. IMPRESSION: Successful fluoroscopic guided percutaneous gastrostomy tube placement. Electronically Signed   By: Markus Daft M.D.   On: 02/06/2018 12:33   Results for orders placed or performed during the hospital encounter of 02/02/18 (from the past 72 hour(s))  Glucose, capillary     Status: Abnormal   Collection Time: 02/05/18 11:34 AM  Result Value Ref Range   Glucose-Capillary 164 (H) 70 - 99 mg/dL  Glucose, capillary     Status: Abnormal   Collection Time: 02/05/18  4:49 PM  Result Value Ref Range   Glucose-Capillary 128 (H) 70 - 99 mg/dL  Glucose, capillary     Status: Abnormal   Collection Time: 02/05/18  8:10 PM  Result Value Ref Range   Glucose-Capillary 110 (H) 70 - 99 mg/dL  Glucose, capillary     Status: Abnormal   Collection Time: 02/05/18 11:46 PM  Result Value Ref Range   Glucose-Capillary 159 (H) 70 - 99 mg/dL  Glucose, capillary     Status: Abnormal   Collection Time: 02/06/18  4:07 AM  Result Value Ref Range   Glucose-Capillary 142 (H) 70 - 99 mg/dL  Glucose, capillary     Status: Abnormal   Collection Time: 02/06/18  8:11 AM  Result Value Ref Range   Glucose-Capillary 137 (H) 70 - 99 mg/dL  Glucose, capillary     Status: Abnormal   Collection Time: 02/06/18 11:32 AM  Result Value Ref Range   Glucose-Capillary 120 (H) 70 - 99 mg/dL  Comprehensive metabolic panel     Status: Abnormal   Collection Time: 02/06/18 12:10 PM  Result Value Ref Range   Sodium 152 (H) 135 - 145 mmol/L   Potassium 4.3 3.5 - 5.1 mmol/L    Chloride 115 (H) 98 - 111 mmol/L   CO2 28 22 - 32 mmol/L   Glucose, Bld 152 (H) 70 - 99 mg/dL   BUN 71 (H) 8 - 23 mg/dL   Creatinine, Ser 2.30 (H) 0.61 - 1.24 mg/dL   Calcium 9.3 8.9 - 10.3 mg/dL   Total Protein 6.8 6.5 - 8.1 g/dL   Albumin 3.2 (L) 3.5 - 5.0 g/dL   AST 23 15 - 41 U/L   ALT 55 (H) 0 - 44 U/L   Alkaline Phosphatase 50 38 - 126 U/L   Total Bilirubin 0.5 0.3 - 1.2 mg/dL   GFR calc non Af Amer 27 (L) >60 mL/min   GFR calc Af Amer 31 (L) >60 mL/min    Comment: (NOTE) The eGFR has been calculated using the CKD EPI equation. This calculation has not been validated in all clinical situations. eGFR's persistently <60 mL/min signify possible Chronic Kidney Disease.    Anion gap 9 5 - 15    Comment: Performed at [facility] [address], [place], [place] [address]  Glucose, capillary     Status: Abnormal   Collection Time: 02/06/18  4:23 PM  Result Value Ref Range   Glucose-Capillary 144 (H) 70 - 99 mg/dL  Glucose, capillary     Status: Abnormal   Collection Time: 02/06/18  7:58 PM  Result Value Ref Range   Glucose-Capillary 139 (H) 70 - 99 mg/dL  Basic metabolic panel     Status: Abnormal   Collection Time: 02/07/18  5:57 AM  Result Value Ref Range   Sodium 147 (H) 135 - 145 mmol/L   Potassium 4.6 3.5 - 5.1 mmol/L    Comment: HEMOLYSIS AT THIS LEVEL MAY AFFECT RESULT   Chloride 114 (H) 98 - 111 mmol/L   CO2 24 22 - 32 mmol/L   Glucose, Bld 113 (H) 70 - 99 mg/dL   BUN 63 (H) 8 - 23 mg/dL   Creatinine, Ser 2.03 (H) 0.61 - 1.24 mg/dL   Calcium 8.9 8.9 - 10.3 mg/dL   GFR calc non Af Amer 32 (L) >60 mL/min   GFR calc Af Amer 37 (L) >60 mL/min    Comment: (NOTE) The eGFR has been calculated using the CKD EPI equation. This calculation has not been validated in all clinical situations. eGFR's persistently <60 mL/min signify possible Chronic Kidney Disease.    Anion gap 9 5 - 15    Comment: Performed at [facility] [address] [place],  [place] [address]  CBC     Status: Abnormal   Collection Time: 02/07/18  5:57 AM  Result Value Ref Range   WBC 10.7 (H) 4.0 - 10.5 K/uL   RBC 4.77 4.22 - 5.81 MIL/uL   Hemoglobin 12.4 (L) 13.0 - 17.0 g/dL   HCT 41.2 39.0 - 52.0 %   MCV 86.4 78.0 - 100.0 fL   MCH 26.0 26.0 - 34.0 pg   MCHC 30.1 30.0 - 36.0 g/dL   RDW 15.5 11.5 - 15.5 %   Platelets 236 150 - 400 K/uL    Comment: Performed at [facility], [place],  [place] [address]  Basic metabolic panel     Status: Abnormal   Collection Time: 02/08/18  5:55 AM  Result Value Ref Range   Sodium 142 135 - 145 mmol/L   Potassium 3.7 3.5 - 5.1 mmol/L   Chloride 109 98 - 111 mmol/L   CO2 26 22 - 32 mmol/L   Glucose, Bld 163 (H) 70 - 99 mg/dL   BUN 48 (H) 8 - 23 mg/dL   Creatinine, Ser 1.75 (H) 0.61 - 1.24 mg/dL   Calcium 8.4 (L) 8.9 - 10.3 mg/dL   GFR calc non Af Amer 38 (L) >60 mL/min   GFR calc Af Amer 44 (L) >60 mL/min    Comment: (NOTE) The eGFR has been calculated using the CKD EPI equation. This calculation has not been validated in all clinical situations. eGFR's persistently <60 mL/min signify possible Chronic Kidney Disease.    Anion gap 7 5 - 15    Comment: Performed at [facility] [address], [place], [place] [address]    Constitutional: No distress . Vital signs reviewed. HEENT: EOMI, oral membranes moist Neck: supple Cardiovascular: RRR without murmur. No JVD    Respiratory: CTA Bilaterally without wheezes or rales. Normal effort    GI: BS +, +PEG, non-distended  Skin:   Intact. Warm and dry. Neuro: aroused fairly easily. Motor: limited due to aphasia, 0/5 Right side Musc/Skel:  No edema or tenderness in extremities Psych: flat  Assessment/Plan: 1. 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 Physiatrist is providing close team supervision and 24 hour management of active medical problems listed below. Physiatrist and rehab team continue to assess barriers to discharge/monitor patient progress toward functional and medical goals. FIM: Function - Bathing Position: Wheelchair/chair at sink Body parts bathed by patient: Chest, Abdomen Body parts bathed by helper: Right arm, Left arm, Front perineal area, Buttocks, Right upper leg, Left upper leg, Right lower leg, Left lower leg, Back Assist Level: 2 helpers  Function- Upper Body Dressing/Undressing What is the patient wearing?: Button up shirt Button up shirt - Perfomed by helper: Thread/unthread right sleeve, Thread/unthread left sleeve, Button/unbutton shirt, Pull shirt around back Assist Level: (Total assist) Function - Lower Body Dressing/Undressing What is the patient wearing?: Pants, Non-skid slipper socks Position: Wheelchair/chair at sink Pants- Performed by helper: Thread/unthread right pants leg, Thread/unthread left pants leg, Pull pants up/down Non-skid slipper socks- Performed by helper: Don/doff right sock, Don/doff left sock Assist for footwear: Dependant Assist for lower body dressing: 2 Helpers  Function - Toileting Toileting activity did not occur: No continent bowel/bladder event Assist level: Two helpers  Function - Air cabin crew transfer activity did not occur: Safety/medical concerns  Function - Chair/bed transfer Chair/bed transfer method: Squat pivot Chair/bed transfer assist level: 2 helpers Chair/bed transfer assistive device: Armrests Mechanical lift: Stedy Chair/bed transfer details: Tactile cues for initiation, Tactile cues for weight shifting, Tactile cues for posture, Tactile cues for sequencing, Tactile cues for placement, Verbal cues for technique, Verbal cues for sequencing, Manual facilitation for weight shifting, Manual facilitation for placement  Function -  Locomotion: Wheelchair Will patient use wheelchair at discharge?: Yes Wheelchair activity did not occur: Safety/medical concerns Assist Level: Dependent (Pt equals 0%) Wheel 50 feet with 2 turns activity did not occur: Safety/medical concerns Wheel 150 feet activity did not occur: Safety/medical concerns Turns around,maneuvers to  table,bed, and toilet,negotiates 3% grade,maneuvers on rugs and over doorsills: No Function - Locomotion: Ambulation Assistive device: (3 muskateers + w/c follow) Max distance: 3 ft Assist level: 2 helpers Walk 10 feet activity did not occur: Safety/medical concerns Walk 50 feet with 2 turns activity did not occur: Safety/medical concerns Walk 150 feet activity did not occur: Safety/medical concerns Walk 10 feet on uneven surfaces activity did not occur: Safety/medical concerns  Function - Comprehension Comprehension: Auditory Comprehension assist level: Understands basic 25 - 49% of the time/ requires cueing 50 - 75% of the time  Function - Expression Expression: Verbal Expression assist level: Expresses basic 25 - 49% of the time/requires cueing 50 - 75% of the time. Uses single words/gestures.  Function - Social Interaction Social Interaction assist level: Interacts appropriately 25 - 49% of time - Needs frequent redirection.  Function - Problem Solving Problem solving assist level: Solves basic 25 - 49% of the time - needs direction more than half the time to initiate, plan or complete simple activities  Function - Memory Memory assist level: Recognizes or recalls 25 - 49% of the time/requires cueing 50 - 75% of the time Patient normally able to recall (first 3 days only): None of the above  Medical Problem List and Plan: 1.Right side weakness with facial droop and dysphasiasecondary to large left frontoparietal hemorrhagic infarction with vasogenic edema and mass-effect  Continue CIR  -therapy decr to 15/7 due to poor therapy tolerance 2. DVT  Prophylaxis/Anticoagulation: SCDs. Monitor for any signs of DVT 3. Pain Management:Zanaflex 4 mg nightly 4. Mood:Provide emotional support 5. Neuropsych: This patientis notcapable of making decisions on hisown behalf. 6. Skin/Wound Care:Routine skin checks 7. Fluids/Electrolytes/Nutrition:Routine in and outs 8.Dysphagia: PEG placed today per IR  -follow for healing 9.Hypertension.   Lisinopril 20 mg daily, Norvasc 10 mg daily. Monitor with increased mobility  Fair control 7/31 10.Diabetes mellitus. Hemoglobin A1c 6.0.   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 12.Hyperlipidemia. Fenofibrate. 13. Hypernatremia  Sodium 152 today  Free water flushes increased   Serial labs 14. AKI on CKD  BUN/Creatinine with some improvement again today  -continue serial labs  -began IVF 7/29.   -increase H2O flushes. Hold IVF for now 67. Anemia of chronic disease  Hemoglobin 12.4 on 7/30  Cont to monitor 6. Transaminitis  resolved  LOS (Days) 6 A FACE TO FACE EVALUATION WAS PERFORMED  Meredith Staggers 02/08/2018, 9:52 AM

## 2018-02-08 NOTE — Progress Notes (Signed)
Physical Therapy Session Note  Patient Details  Name: Adam Yang MRN: [id number] Date of Birth: 06/05/1946  Today's Date: 02/08/2018 PT Individual Time: 1300-1330 PT Individual Time Calculation (min): 30 min   Short Term Goals: Week 1:  PT Short Term Goal 1 (Week 1): Pt will complete bed<>w/c with mod assist +1. PT Short Term Goal 2 (Week 1): Pt will propel w/c 25 ft with min assist. PT Short Term Goal 3 (Week 1): Pt will complete bed mobility with mod assist.  Skilled Therapeutic Interventions/Progress Updates:    Upon entrance, Pt in deep sleep and difficult to arouse and observably lethargic. PT initiated rolling to his L&R providing total A for rolling and for changing brief due to voiding episode. PT provided max multimodal cues for sequencing but Pt unable to follow 1 step commands and demonstrated difficulty keeping his eyes open. PT attempted to place Pt in supine hooklying to facilitate bridging for don/doff brief; however, Pt unable to demonstrate. Pt attempted table top activity with focus on following 1 step commands and initiation. Pt required HOH assist in 100% of trials with no perceived effort from Pt. Pt missed 30 minutes of therapy due to lethargy. Pt returned to supine with call bell and bed alarm activated and all needs met.   Therapy Documentation Precautions:  Precautions Precautions: Fall Precaution Comments: R hemi, R inattention Restrictions Weight Bearing Restrictions: No   See Function Navigator for Current Functional Status.   Therapy/Group: Individual Therapy    02/08/2018, 1:45 PM  

## 2018-02-08 NOTE — Progress Notes (Signed)
Speech Language Pathology Daily Session Note  Patient Details  Name: Keylin Podolsky MRN: [id number] Date of Birth: 01-Apr-1946  Today's Date: 02/08/2018 SLP Individual Time: 7793-9030 SLP Individual Time Calculation (min): 30 min  Short Term Goals: Week 1: SLP Short Term Goal 1 (Week 1): Pt will utilize multimodal means to communicate basic wants and needs in 75% of opportunties with Mod A cues.  SLP Short Term Goal 2 (Week 1): Pt answer yes/no questions using multimodal communication in 5 out of 10 opportunities with Max A cues.  SLP Short Term Goal 3 (Week 1): Pt will imitate common sounds/ phrases with multimodal cues and Max A level of support.  SLP Short Term Goal 4 (Week 1): Pt will complete basic problem solving tasks related to ADLs with Min A cues.  SLP Short Term Goal 5 (Week 1): Pt will perform pharyngeal strengthening exercises with Max A cues.  SLP Short Term Goal 6 (Week 1): Given oral stim, pt will initiate timely swallow with Max A cues to perserve function and for participation in dysphagia therpay.   Skilled Therapeutic Interventions:Skilled ST services focused on cognitive skills. Pt was located behind nursing station with noted spillage of feeding tube soaking pants, tube had become disconnected. SLP notified nurse and NT. Pt demonstrated agitated behavior, moving hand over wet area on pants and vocalizing, however intelligible. SLP attempted to redirection communication in response to yes/no questions to express wants/needs, however pt required Max A verbal cues for 30% accuracy.  NT and SLP assisted with transfer from [facility] to bed with maxi move and changed clothing. Pt demonstrated ability to follow I step commands with mod A verbal/tatcile cues. Pt demonstrated fleeting focused attention and following clothing change began to fall asleep. SLP attempted oral care and resistant chin tuck to strengthen pharyngeal swallow, however pt refused to oral care and unable to maintain focused  attention with max A verbal and tactile cues. Treatment session was ended early due to fatigue. Pt was left in room with call bell within reach and bed alaram set.ST reccomends to continue skilled ST services.     Function:  Eating Eating                 Cognition Comprehension Comprehension assist level: Understands basic 25 - 49% of the time/ requires cueing 50 - 75% of the time  Expression   Expression assist level: Expresses basic 25 - 49% of the time/requires cueing 50 - 75% of the time. Uses single words/gestures.  Social Interaction Social Interaction assist level: Interacts appropriately 25 - 49% of time - Needs frequent redirection.  Problem Solving Problem solving assist level: Solves basic 25 - 49% of the time - needs direction more than half the time to initiate, plan or complete simple activities  Memory Memory assist level: Recognizes or recalls 25 - 49% of the time/requires cueing 50 - 75% of the time    Pain Pain Assessment Pain Score: 0-No pain  Therapy/Group: Individual Therapy  Leyla Soliz  [facility] 02/08/2018, 10:54 AM

## 2018-02-09 ENCOUNTER — Inpatient Hospital Stay (HOSPITAL_COMMUNITY): Payer: Medicare Other

## 2018-02-09 ENCOUNTER — Inpatient Hospital Stay (HOSPITAL_COMMUNITY): Payer: Medicare Other | Admitting: Occupational Therapy

## 2018-02-09 ENCOUNTER — Inpatient Hospital Stay (HOSPITAL_COMMUNITY): Payer: Medicare Other | Admitting: Speech Pathology

## 2018-02-09 LAB — BASIC METABOLIC PANEL
ANION GAP: 7 (ref 5–15)
BUN: 39 mg/dL — ABNORMAL HIGH (ref 8–23)
CALCIUM: 8.5 mg/dL — AB (ref 8.9–10.3)
CO2: 28 mmol/L (ref 22–32)
Chloride: 106 mmol/L (ref 98–111)
Creatinine, Ser: 1.58 mg/dL — ABNORMAL HIGH (ref 0.61–1.24)
GFR, EST AFRICAN AMERICAN: 49 mL/min — AB (ref >60)
GFR, EST NON AFRICAN AMERICAN: 43 mL/min — AB (ref >60)
GLUCOSE: 146 mg/dL — AB (ref 70–99)
POTASSIUM: 3.8 mmol/L (ref 3.5–5.1)
Sodium: 141 mmol/L (ref 135–145)

## 2018-02-09 MED ORDER — DICLOFENAC SODIUM 1 % TD GEL
2.0000 g | Freq: Four times a day (QID) | TRANSDERMAL | Status: DC
  Administered 2018-02-09 – 2018-02-23 (×49): 2 g via TOPICAL
  Filled 2018-02-09: qty 100

## 2018-02-09 MED ORDER — TIZANIDINE HCL 2 MG PO TABS: 2.0000 mg | ORAL_TABLET | Freq: Every day | ORAL | Status: DC | PRN

## 2018-02-09 MED ORDER — PRO-STAT SUGAR FREE PO LIQD
30.0000 mL | Freq: Every day | ORAL | Status: DC
  Administered 2018-02-09 – 2018-02-23 (×14): 30 mL
  Filled 2018-02-09 (×14): qty 30

## 2018-02-09 MED ORDER — METHYLPHENIDATE HCL 5 MG PO TABS
5.0000 mg | ORAL_TABLET | Freq: Two times a day (BID) | ORAL | Status: DC
  Administered 2018-02-09 – 2018-02-22 (×18): 5 mg
  Filled 2018-02-09 (×21): qty 1

## 2018-02-09 MED ORDER — ACETAMINOPHEN 160 MG/5ML PO SOLN
650.0000 mg | ORAL | Status: DC | PRN
  Administered 2018-02-10 – 2018-02-23 (×16): 650 mg
  Filled 2018-02-09 (×17): qty 20.3

## 2018-02-09 MED ORDER — TIZANIDINE HCL 2 MG PO TABS: 2.0000 mg | ORAL_TABLET | Freq: Every day | ORAL | Status: DC

## 2018-02-09 NOTE — Progress Notes (Addendum)
Nutrition Follow-up  DOCUMENTATION CODES:   Non-severe (moderate) malnutrition in context of chronic illness  INTERVENTION:  Continue Glucerna 1.2 formula via PEG at goal rate of 75 ml/hr x 20 hours (may hold TF for up to 4 hours for therapy) with 30 ml Prostat once daily.  Continue free water flushes of 200 ml every 3 hours. (MD to adjust as appropriate)  Tube feeding regimen provides 1900 kcal (100% of needs), 105 grams of protein, and 2015 ml of H2O.   NUTRITION DIAGNOSIS:   Moderate Malnutrition related to chronic illness(known hx cancer with dysphagia) as evidenced by moderate fat depletion, moderate muscle depletion; ongoing  GOAL:   Patient will meet greater than or equal to 90% of their needs; met via TF  MONITOR:   PO intake, Weight trends, Labs, Skin, I & O's, TF tolerance  REASON FOR ASSESSMENT:   Consult Enteral/tube feeding initiation and management  ASSESSMENT:   72 year old right-handed male with history of hypertension, type 2 diabetes mellitus, CKD, nasopharynx cancer with radiation therapy and remote tobacco abuse.  Presented 01/26/2018 with right-sided weakness as well as facial droop and bradycardia. 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 Patient currently NPO with a Cortrak tube for nutritional support.    PEG placed 7/29. Pt continues to have decreased arousal and alertness. Pt minimally responsive to commands. Pt has been tolerating his tube feedings well. Pt with weight loss per weight records. RD to add Prostat to aid in caloric and protein needs as well as in prevention of further weight loss. RD to continue to monitor. Labs and medications reviewed.   Diet Order:   Diet Order           Diet NPO time specified  Diet effective now          EDUCATION NEEDS:   Not appropriate for education at this time  Skin:  Skin Assessment: Reviewed RN Assessment  Last BM:  8/1  Height:   Ht Readings from Last 1 Encounters:  02/02/18 5'  9" (1.753 m)    Weight:   Wt Readings from Last 1 Encounters:  02/08/18 130 lb 8.7 oz (59.2 kg)    Ideal Body Weight:  72.7 kg  BMI:  Body mass index is 19.28 kg/m.  Estimated Nutritional Needs:   Kcal:  1750-1900  Protein:  80-95 grams  Fluid:  >/= 1.7 L/day    Corrin Parker, MS, RD, LDN Pager # [phone] After hours/ weekend pager # [phone]

## 2018-02-09 NOTE — Progress Notes (Signed)
Occupational Therapy Session Note  Patient Details  Name: Adam Yang MRN: [id number] Date of Birth: Jun 09, 1946  Today's Date: 02/09/2018 OT Individual Time: 3704-8889 OT Individual Time Calculation (min): 45 min  and Today's Date: 02/09/2018 OT Missed Time: 15 Minutes Missed Time Reason: Patient fatigue   Short Term Goals: Week 1:  OT Short Term Goal 1 (Week 1): Pt will complete sit > stand with mod assist to decrease burden of care with LB bathing/dressing OT Short Term Goal 2 (Week 1): Pt will complete bathing with max assist with mod cues for sequencing OT Short Term Goal 3 (Week 1): Pt will complete UB dressing with max assist OT Short Term Goal 4 (Week 1): Pt will complete toilet transfer with max assist of 1 caregiver  Skilled Therapeutic Interventions/Progress Updates:    Treatment session with focus on arousal and attention to task during self-care tasks.  Pt received supine in bed asleep, pt aroused to name when therapist positioned on pt's Rt.  Pt would open eyes briefly but would quickly close them. Pt requiring hand over hand to reach for bed rail with LUE to assist with rolling, therapist assisted with bending LLE when rolling as pt unable to follow one step commands this session.  Max assist when rolling to Rt and +2 when rolling Lt to complete hygiene as pt incontinent of bladder.  Donned pants with total assist +2 for rolling, pt did not engage when attempting to bridge for clothing management as he has done before.  Pt with less participation throughout session today, therefore session terminated early and pt left in bed with all needs in reach.  Therapy Documentation Precautions:  Precautions Precautions: Fall Precaution Comments: R hemi, R inattention Restrictions Weight Bearing Restrictions: No General: General OT Amount of Missed Time: 15 Minutes Pain:  Pt with no c/o pain, no groaning during treatment session  See Function Navigator for Current Functional  Status.   Therapy/Group: Individual Therapy  Khaleelah Yowell, [place] 02/09/2018, 11:08 AM

## 2018-02-09 NOTE — Progress Notes (Signed)
Subjective/Complaints: Had an uneventful night.   ROS: Limited due to cognitive/behavioral   Objective: Vital Signs: Blood pressure (!) 142/80, pulse 84, temperature 98.2 F (36.8 C), temperature source Oral, resp. rate 18, height '5\' 9"'$  (1.753 m), weight 59.2 kg (130 lb 8.7 oz), SpO2 90 %. No results found. Results for orders placed or performed during the hospital encounter of 02/02/18 (from the past 72 hour(s))  Glucose, capillary     Status: Abnormal   Collection Time: 02/06/18 11:32 AM  Result Value Ref Range   Glucose-Capillary 120 (H) 70 - 99 mg/dL  Comprehensive metabolic panel     Status: Abnormal   Collection Time: 02/06/18 12:10 PM  Result Value Ref Range   Sodium 152 (H) 135 - 145 mmol/L   Potassium 4.3 3.5 - 5.1 mmol/L   Chloride 115 (H) 98 - 111 mmol/L   CO2 28 22 - 32 mmol/L   Glucose, Bld 152 (H) 70 - 99 mg/dL   BUN 71 (H) 8 - 23 mg/dL   Creatinine, Ser 2.30 (H) 0.61 - 1.24 mg/dL   Calcium 9.3 8.9 - 10.3 mg/dL   Total Protein 6.8 6.5 - 8.1 g/dL   Albumin 3.2 (L) 3.5 - 5.0 g/dL   AST 23 15 - 41 U/L   ALT 55 (H) 0 - 44 U/L   Alkaline Phosphatase 50 38 - 126 U/L   Total Bilirubin 0.5 0.3 - 1.2 mg/dL   GFR calc non Af Amer 27 (L) >60 mL/min   GFR calc Af Amer 31 (L) >60 mL/min    Comment: (NOTE) The eGFR has been calculated using the CKD EPI equation. This calculation has not been validated in all clinical situations. eGFR's persistently <60 mL/min signify possible Chronic Kidney Disease.    Anion gap 9 5 - 15    Comment: Performed at [facility] [address], [place], [place] [address]  Glucose, capillary     Status: Abnormal   Collection Time: 02/06/18  4:23 PM  Result Value Ref Range   Glucose-Capillary 144 (H) 70 - 99 mg/dL  Glucose, capillary     Status: Abnormal   Collection Time: 02/06/18  7:58 PM  Result Value Ref Range   Glucose-Capillary 139 (H) 70 - 99 mg/dL  Basic metabolic panel     Status: Abnormal   Collection Time: 02/07/18   5:57 AM  Result Value Ref Range   Sodium 147 (H) 135 - 145 mmol/L   Potassium 4.6 3.5 - 5.1 mmol/L    Comment: HEMOLYSIS AT THIS LEVEL MAY AFFECT RESULT   Chloride 114 (H) 98 - 111 mmol/L   CO2 24 22 - 32 mmol/L   Glucose, Bld 113 (H) 70 - 99 mg/dL   BUN 63 (H) 8 - 23 mg/dL   Creatinine, Ser 2.03 (H) 0.61 - 1.24 mg/dL   Calcium 8.9 8.9 - 10.3 mg/dL   GFR calc non Af Amer 32 (L) >60 mL/min   GFR calc Af Amer 37 (L) >60 mL/min    Comment: (NOTE) The eGFR has been calculated using the CKD EPI equation. This calculation has not been validated in all clinical situations. eGFR's persistently <60 mL/min signify possible Chronic Kidney Disease.    Anion gap 9 5 - 15    Comment: Performed at [facility] [address], [place], [place] [address]  CBC     Status: Abnormal   Collection Time: 02/07/18  5:57 AM  Result Value Ref Range   WBC 10.7 (  H) 4.0 - 10.5 K/uL   RBC 4.77 4.22 - 5.81 MIL/uL   Hemoglobin 12.4 (L) 13.0 - 17.0 g/dL   HCT 41.2 39.0 - 52.0 %   MCV 86.4 78.0 - 100.0 fL   MCH 26.0 26.0 - 34.0 pg   MCHC 30.1 30.0 - 36.0 g/dL   RDW 15.5 11.5 - 15.5 %   Platelets 236 150 - 400 K/uL    Comment: Performed at [facility], [place] [address], [place], [place] [address]  Basic metabolic panel     Status: Abnormal   Collection Time: 02/08/18  5:55 AM  Result Value Ref Range   Sodium 142 135 - 145 mmol/L   Potassium 3.7 3.5 - 5.1 mmol/L   Chloride 109 98 - 111 mmol/L   CO2 26 22 - 32 mmol/L   Glucose, Bld 163 (H) 70 - 99 mg/dL   BUN 48 (H) 8 - 23 mg/dL   Creatinine, Ser 1.75 (H) 0.61 - 1.24 mg/dL   Calcium 8.4 (L) 8.9 - 10.3 mg/dL   GFR calc non Af Amer 38 (L) >60 mL/min   GFR calc Af Amer 44 (L) >60 mL/min    Comment: (NOTE) The eGFR has been calculated using the CKD EPI equation. This calculation has not been validated in all clinical situations. eGFR's persistently <60 mL/min signify possible Chronic Kidney Disease.    Anion gap 7 5 - 15    Comment:  Performed at [facility] [address], [place], [place] [address]  Basic metabolic panel     Status: Abnormal   Collection Time: 02/09/18  3:36 AM  Result Value Ref Range   Sodium 141 135 - 145 mmol/L   Potassium 3.8 3.5 - 5.1 mmol/L   Chloride 106 98 - 111 mmol/L   CO2 28 22 - 32 mmol/L   Glucose, Bld 146 (H) 70 - 99 mg/dL   BUN 39 (H) 8 - 23 mg/dL   Creatinine, Ser 1.58 (H) 0.61 - 1.24 mg/dL   Calcium 8.5 (L) 8.9 - 10.3 mg/dL   GFR calc non Af Amer 43 (L) >60 mL/min   GFR calc Af Amer 49 (L) >60 mL/min    Comment: (NOTE) The eGFR has been calculated using the CKD EPI equation. This calculation has not been validated in all clinical situations. eGFR's persistently <60 mL/min signify possible Chronic Kidney Disease.    Anion gap 7 5 - 15    Comment: Performed at [facility] [address], [place], [place] [address]    Constitutional: No distress . Vital signs reviewed.frail HEENT: EOMI, oral membranes moist Neck: supple Cardiovascular: RRR without murmur. No JVD    Respiratory: CTA Bilaterally without wheezes or rales. Normal effort    GI: BS +, PEG,  non-distended  Skin:   Intact. Warm and dry. Neuro: arouses with tactile and verbal stim. Motor: limited due to aphasia, 0/5 Right side Musc/Skel:  No edema or tenderness in extremities Psych: flat  Assessment/Plan: 1. 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 Physiatrist is providing close team supervision and 24 hour management of active medical problems listed below. Physiatrist and rehab team continue to assess barriers to discharge/monitor patient progress toward functional and medical goals. FIM: Function - Bathing Position: Bed(UB seated EOB, LB bed level) Body parts bathed by patient: Chest,  Abdomen  Body parts bathed by helper: Right arm, Left arm, Front perineal area, Buttocks, Right upper leg, Left upper leg, Right lower leg, Left lower leg, Back, Chest, Abdomen Assist Level: 2 helpers  Function- Upper Body Dressing/Undressing What is the patient wearing?: Pull over shirt/dress Pull over shirt/dress - Perfomed by helper: Thread/unthread right sleeve, Thread/unthread left sleeve, Put head through opening, Pull shirt over trunk Button up shirt - Perfomed by helper: Thread/unthread right sleeve, Thread/unthread left sleeve, Button/unbutton shirt, Pull shirt around back Assist Level: 2 helpers Function - Lower Body Dressing/Undressing What is the patient wearing?: Pants, Non-skid slipper socks Position: Bed Pants- Performed by helper: Thread/unthread right pants leg, Thread/unthread left pants leg, Pull pants up/down Non-skid slipper socks- Performed by helper: Don/doff right sock, Don/doff left sock Assist for footwear: Dependant Assist for lower body dressing: (total assist)  Function - Toileting Toileting activity did not occur: No continent bowel/bladder event Assist level: Two helpers  Function - Air cabin crew transfer activity did not occur: Safety/medical concerns  Function - Chair/bed transfer Chair/bed transfer method: Squat pivot Chair/bed transfer assist level: 2 helpers Chair/bed transfer assistive device: Armrests Mechanical lift: Stedy Chair/bed transfer details: Tactile cues for initiation, Tactile cues for weight shifting, Tactile cues for posture, Tactile cues for sequencing, Tactile cues for placement, Verbal cues for technique, Verbal cues for sequencing, Manual facilitation for weight shifting, Manual facilitation for placement  Function - Locomotion: Wheelchair Will patient use wheelchair at discharge?: Yes Wheelchair activity did not occur: Safety/medical concerns Assist Level: Dependent (Pt equals 0%) Wheel 50 feet with 2 turns activity  did not occur: Safety/medical concerns Wheel 150 feet activity did not occur: Safety/medical concerns Turns around,maneuvers to table,bed, and toilet,negotiates 3% grade,maneuvers on rugs and over doorsills: No Function - Locomotion: Ambulation Assistive device: (3 muskateers + w/c follow) Max distance: 3 ft Assist level: 2 helpers Walk 10 feet activity did not occur: Safety/medical concerns Walk 50 feet with 2 turns activity did not occur: Safety/medical concerns Walk 150 feet activity did not occur: Safety/medical concerns Walk 10 feet on uneven surfaces activity did not occur: Safety/medical concerns  Function - Comprehension Comprehension: Auditory Comprehension assist level: Understands basic less than 25% of the time/ requires cueing >75% of the time  Function - Expression Expression: Verbal Expression assistive device: Other (Comment) Expression assist level: Expresses basis less than 25% of the time/requires cueing >75% of the time.  Function - Social Interaction Social Interaction assist level: Interacts appropriately less than 25% of the time. May be withdrawn or combative.  Function - Problem Solving Problem solving assist level: Solves basic less than 25% of the time - needs direction nearly all the time or does not effectively solve problems and may need a restraint for safety  Function - Memory Memory assist level: Recognizes or recalls less than 25% of the time/requires cueing greater than 75% of the time Patient normally able to recall (first 3 days only): None of the above  Medical Problem List and Plan: 1.Right side weakness with facial droop and dysphasiasecondary to large left frontoparietal hemorrhagic infarction with vasogenic edema and mass-effect  Continue CIR  -therapy decr to 15/7 due to poor therapy tolerance  -add low dose ritalin to improve arousal 2. DVT Prophylaxis/Anticoagulation: SCDs. Monitor for any signs of DVT 3. Pain Management:Zanaflex  4 mg nightly 4. Mood:Provide emotional support  -mood component to lethargy?? 5. Neuropsych: This patientis notcapable of making decisions on hisown behalf. 6. Skin/Wound Care:Routine skin checks 7. Fluids/Electrolytes/Nutrition:Routine in and outs 8.Dysphagia: PEG  placed 7/28 by INR  -follow for healing 9.Hypertension.   Lisinopril 20 mg daily, Norvasc 10 mg daily. Monitor with increased mobility  Fair control 8/1 10.Diabetes mellitus. Hemoglobin A1c 6.0.   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 12.Hyperlipidemia. Fenofibrate. 13. Hypernatremia  Sodium has improved to 141 today  Free water flushes increased   Serial labs 14. AKI on CKD  BUN/Creatinine with gradual improvement===>39/1.58  -continue serial labs--recheck tomorrow  -off IVF since 7/31   -H20 flushes 200 q3 15. Anemia of chronic disease  Hemoglobin 12.4 on 7/30  Cont to monitor 6. Transaminitis  resolved  LOS (Days) 7 A FACE TO FACE EVALUATION WAS PERFORMED  Meredith Staggers 02/09/2018, 10:05 AM

## 2018-02-09 NOTE — Progress Notes (Signed)
Speech Language Pathology Daily Session Note  Patient Details  Name: Adam Yang MRN: [id number] Date of Birth: 1946-04-24  Today's Date: 02/09/2018 SLP Individual Time: 3007-6226 SLP Individual Time Calculation (min): 19 min  Short Term Goals: Week 1: SLP Short Term Goal 1 (Week 1): Pt will utilize multimodal means to communicate basic wants and needs in 75% of opportunties with Mod A cues.  SLP Short Term Goal 2 (Week 1): Pt answer yes/no questions using multimodal communication in 5 out of 10 opportunities with Max A cues.  SLP Short Term Goal 3 (Week 1): Pt will imitate common sounds/ phrases with multimodal cues and Max A level of support.  SLP Short Term Goal 4 (Week 1): Pt will complete basic problem solving tasks related to ADLs with Min A cues.  SLP Short Term Goal 5 (Week 1): Pt will perform pharyngeal strengthening exercises with Max A cues.  SLP Short Term Goal 6 (Week 1): Given oral stim, pt will initiate timely swallow with Max A cues to perserve function and for participation in dysphagia therpay.   Skilled Therapeutic Interventions:  Skilled treatment session targeted arousal/alertness. SLP received pt in pt supine and asleep. With Total A multimodal cues, pt was able to briefly open eyes but immediately closed them and was asleep. Pt not able to participate in any skilled activities. Pt left in bed, bed alarm on, telesitter and all needs within reach. Continue per current plan of care.      Function:    Cognition Comprehension Comprehension assist level: Understands basic less than 25% of the time/ requires cueing >75% of the time  Expression   Expression assist level: Expresses basis less than 25% of the time/requires cueing >75% of the time.  Social Interaction Social Interaction assist level: Interacts appropriately less than 25% of the time. May be withdrawn or combative.  Problem Solving Problem solving assist level: Solves basic less than 25% of the time - needs  direction nearly all the time or does not effectively solve problems and may need a restraint for safety  Memory Memory assist level: Recognizes or recalls less than 25% of the time/requires cueing greater than 75% of the time    Pain    Therapy/Group: Individual Therapy  Davielle Lingelbach 02/09/2018, 9:49 AM

## 2018-02-09 NOTE — Progress Notes (Signed)
Physical Therapy Session Note  Patient Details  Name: Adam Yang MRN: [id number] Date of Birth: March 13, 1946  Today's Date: 02/09/2018 PT Individual Time: 1015-1130 PT Individual Time Calculation (min): 75 min   Short Term Goals: Week 1:  PT Short Term Goal 1 (Week 1): Pt will complete bed<>w/c with mod assist +1. PT Short Term Goal 2 (Week 1): Pt will propel w/c 25 ft with min assist. PT Short Term Goal 3 (Week 1): Pt will complete bed mobility with mod assist.  Skilled Therapeutic Interventions/Progress Updates:   Pt  asleep but awakened to name, wife arriving, and wet wash cloth to face.  Bed mobility for rolling for hygiene change due to wet brief.  PT donned abdominal binder.  Maxi Move transfer bed> tilt in space w/c.  Slide board transfer w/c>< level mat +2.  Therapeutic activity in supported sitting , reaching within BOS with L hand to retrieve horse shoes and drop onto target, and matching cards to board, at midline and slightly to R,  at eye level.  Pt had improved participation using playing cards, but needed hand over hand assistance to use L hand to grasp and place cards.  Slide board to return to w/c.  Stretching R heel cord and hamstring with pt sitting in w/c, x 30 seconds x 2..  PT instructed pt's wife Juliann Pulse in PROM R hip, knee and ankle. She needs practice.   Pt left resting in bed with alarm set and wife in room.  Wife requested a different type of call bell for him.  PT provided soft call bell and suggested she have him practice with it, unplugged.  She stated he is able to select an app on his phone screen to play music.       Therapy Documentation Precautions:  Precautions Precautions: Fall Precaution Comments: R hemi, R inattention Restrictions Weight Bearing Restrictions: No Pain: Pain Assessment Faces Pain Scale: No hurt    See Function Navigator for Current Functional Status.   Therapy/Group: Individual Therapy  Tarin Navarez 02/09/2018, 4:38 PM

## 2018-02-10 ENCOUNTER — Inpatient Hospital Stay (HOSPITAL_COMMUNITY): Payer: Medicare Other | Admitting: Occupational Therapy

## 2018-02-10 ENCOUNTER — Inpatient Hospital Stay (HOSPITAL_COMMUNITY): Payer: Medicare Other | Admitting: Physical Therapy

## 2018-02-10 ENCOUNTER — Inpatient Hospital Stay (HOSPITAL_COMMUNITY): Payer: Medicare Other | Admitting: Speech Pathology

## 2018-02-10 LAB — BASIC METABOLIC PANEL
ANION GAP: 10 (ref 5–15)
BUN: 35 mg/dL — ABNORMAL HIGH (ref 8–23)
CHLORIDE: 101 mmol/L (ref 98–111)
CO2: 27 mmol/L (ref 22–32)
Calcium: 8.6 mg/dL — ABNORMAL LOW (ref 8.9–10.3)
Creatinine, Ser: 1.59 mg/dL — ABNORMAL HIGH (ref 0.61–1.24)
GFR, EST AFRICAN AMERICAN: 49 mL/min — AB (ref >60)
GFR, EST NON AFRICAN AMERICAN: 42 mL/min — AB (ref >60)
Glucose, Bld: 164 mg/dL — ABNORMAL HIGH (ref 70–99)
POTASSIUM: 3.9 mmol/L (ref 3.5–5.1)
SODIUM: 138 mmol/L (ref 135–145)

## 2018-02-10 MED ORDER — HYDROCORTISONE 1 % EX CREA
TOPICAL_CREAM | Freq: Three times a day (TID) | CUTANEOUS | Status: DC
Start: 2018-02-10 — End: 2018-02-23
  Administered 2018-02-10 – 2018-02-14 (×11): via TOPICAL
  Administered 2018-02-15: 1 via TOPICAL
  Administered 2018-02-15 – 2018-02-23 (×13): via TOPICAL
  Filled 2018-02-10: qty 28

## 2018-02-10 NOTE — Patient Care Conference (Signed)
Inpatient RehabilitationTeam Conference and Plan of Care Update Date: 02/08/2018   Time: 10:50 AM    Patient Name: Adam Yang      Medical Record Number: [id number]  Date of Birth: 07-21-1945 Sex: Male         Room/Bed: 4W21C/4W21C-01 Payor Info: Payor: [place] / Plan: BCBS MEDICARE / Product Type: *No Product type* /    Admitting Diagnosis: Lt CVA  Admit Date/Time:  02/02/2018  5:56 PM Admission Comments: No comment available   Primary Diagnosis:  <principal problem not specified> Principal Problem: <principal problem not specified>  Patient Active Problem List   Diagnosis Date Noted  . Dysphasia   . Anemia of chronic disease   . Hypernatremia   . Transaminitis   . Hypertensive emergency 02/02/2018  . Intraparenchymal hemorrhage of brain ([place]) 02/02/2018  . Hemiparesis affecting right side as late effect of stroke ([place])   . Aphasia, post-stroke   . Dysphagia, post-stroke   . Nontraumatic subcortical hemorrhage of left cerebral hemisphere ([place])   . Diabetes mellitus type 2 in nonobese (HCC)   . AKI (acute kidney injury) ([place])   . Hyponatremia   . Chronic kidney disease   . Cytotoxic brain edema ([place]) 01/28/2018  . Brain herniation ([place]) 01/28/2018  . IVH (intraventricular hemorrhage) ([place]) 01/27/2018  . Right inguinal hernia 08/23/2017  . Chronic kidney disease, stage III (moderate) ([place]) 02/18/2017  . 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 Other fatigue 06/18/2016  . Essential hypertension 10/17/2015  . Mucositis due to chemotherapy 01/24/2015  . Hyperglycemia due to type 2 diabetes mellitus ([place]) 01/07/2015  . Chronic anxiety 12/19/2014  . Stage 2 chronic renal impairment associated with type 2 diabetes mellitus ([place]) 12/19/2014  . Type 2 diabetes mellitus, controlled, with renal complications ([place]) 58/52/7782  . History of nasopharyngeal cancer 11/26/2014    Expected Discharge Date:  Expected Discharge Date: 03/01/18  Team Members Present: Physician leading conference: Dr. Delice Lesch Social Worker Present: Alfonse Alpers, LCSW Nurse Present: Arelia Sneddon, RN PT Present: Michaelene Song, PT OT Present: Simonne Come, OT SLP Present: Charolett Bumpers, SLP PPS Coordinator present : Daiva Nakayama, RN, CRRN     Current Status/Progress Goal Weekly Team Focus  Medical   left mca infarct with severe dysphagia, peg placed monday. aki, htn  improve activity tolerance  nutrition, kidney function, dysphagia   Bowel/Bladder   Incontient of bowel/bladder, LBM 02/07/18  to be continent with bbladder/bowe  Assess bow/bladder function q shift and as needed   Swallow/Nutrition/ Hydration   NPO, Max A  Mod A  oral care, swallow initation and pharyngeal strengthing    ADL's   max - total +2 bed mobility, transfers, LB bathing/dressing at bed level due to pushing tendencies in sitting and standing, Rt inattention, decreaed initiation and participation  min assist goals, mod assist LB dressing and toileting (may need to downgrade to mod overall)  ADL retraining, transfers, Rt attention and NMR, participation in treatment sessions   Mobility   max<>total +2 assist bed mobility & bed<>chair, R inattention, limited engagement, significantly impaired cognition & communication  min assist goals likely to be downgraded to mod or max assist +1  R attention, cognitive remediation, transfers, bed mobility, activity tolerance, R NMR, balance, strengthening, endurance   Communication   Max A 50% accuracy yes/no  Mod A  response to yes/no for wants/needs, vocalizing, vowel imitation,    Safety/Cognition/ Behavioral Observations  Max-Mod A  Mod A following commands, Min A problem solving   1 step functional commands, ADL problem solving and familair task, sustained attention    Pain   No S/S of pain noted  <2  Assess and treat pain q shift and as needed   Skin   No skin issues  Skin to be free of  breakdown/infection       Rehab Goals Patient on target to meet rehab goals: Yes Rehab Goals Revised: none *See Care Plan and progress notes for long and short-term goals.     Barriers to Discharge  Current Status/Progress Possible Resolutions Date Resolved   Physician    Medical stability               Nursing                  PT  Incontinence;Nutrition means;Decreased caregiver support;Behavior  significantly impaired cognition & communication, unsure of family can provide extensive physical assist anticipate pt will need, incontinent, new PEG tube              OT                  SLP Nutrition means Endurance, fatigue            SW                Discharge Planning/Teaching Needs:  Pt's wife and dtr to care for pt at home.    Family education to occur prior to d/c.   Team Discussion:  Pt's BP and blood sugars are controlled, but pt needed IVF started due to poor intake.  Pt liver function has normalized, per Dr. Posey Pronto.  Pt with new PEG and wife is upset that her requested feeding tube was not inserted.  Oral care has been a challenge recently.  Pt is +2 for all ADLs and is max to roll in one direction.  He an bridge to get pants up and can pull up one side, but is pushing more now.  Pt has trouble tracking to midline, only looking to left.  Pt refusing oral care with ST and is 50% accurate with yes/no questions with max cues.   Pt's speech is not intelligible.  Pt is total to max +2 with PT and he has right inattention.  Therapy team is making pt 15/7 today with min A goals overall, but may need to downgrade to mod A.  Revisions to Treatment Plan:  none    Continued Need for Acute Rehabilitation Level of Care: The patient requires daily medical management by a physician with specialized training in physical medicine and rehabilitation for the following conditions: Daily direction of a multidisciplinary physical rehabilitation program to ensure safe treatment while eliciting the  highest outcome that is of practical value to the patient.: Yes Daily medical management of patient stability for increased activity during participation in an intensive rehabilitation regime.: Yes Daily analysis of laboratory values and/or radiology reports with any subsequent need for medication adjustment of medical intervention for : Renal problems;Neurological problems;Nutritional problems  Daaron Dimarco, Silvestre Mesi 02/10/2018, 2:22 PM

## 2018-02-10 NOTE — Progress Notes (Signed)
Physical Therapy Weekly Progress Note  Patient Details  Name: Adam Yang MRN: [id number] Date of Birth: Sep 01, 1945  Beginning of progress report period: February 03, 2018 End of progress report period: February 10, 2018  Today's Date: 02/10/2018   Patient has met 0 of 3 short term goals.  Pt is making very slow progress as he is limited by lethargy and decreased activity tolerance. Pt continues to demonstrate significant R inattention, impaired cognition, impaired ability to communicate, decreased sitting balance, impaired bed mobility & transfers. Pt's schedule has been reduced to 15/7 to increase pt's ability to participate. Pt currently requires +2 max<>total assist for bed<>chair transfers. Pt would benefit from continued skilled PT treatment to focus on the previously mentioned deficits and for caregiver education prior to d/c.  Patient continues to demonstrate the following deficits muscle weakness, decreased cardiorespiratoy endurance, decreased coordination, decreased visual perceptual skills, decreased midline orientation, decreased attention to right, right side neglect and decreased motor planning, decreased initiation, decreased attention, decreased awareness, decreased problem solving, decreased safety awareness, decreased memory and delayed processing, peripheral and decreased sitting balance, decreased standing balance, decreased postural control, hemiplegia and decreased balance strategies and therefore will continue to benefit from skilled PT intervention to increase functional independence with mobility.  Patient is making very slow progress towards LTGs.  Plan of care revisions: goals downgraded to mod assist overall.  PT Short Term Goals Week 1:  PT Short Term Goal 1 (Week 1): Pt will complete bed<>w/c with mod assist +1. PT Short Term Goal 1 - Progress (Week 1): Not met PT Short Term Goal 2 (Week 1): Pt will propel w/c 25 ft with min assist. PT Short Term Goal 2 - Progress (Week  1): Not met PT Short Term Goal 3 (Week 1): Pt will complete bed mobility with mod assist. PT Short Term Goal 3 - Progress (Week 1): Not met Week 2:  PT Short Term Goal 1 (Week 2): Pt will complete bed<>w/c with max assist +1. PT Short Term Goal 2 (Week 2): Pt will complete bed mobility with mod assist overall. PT Short Term Goal 3 (Week 2): Pt will initiate w/c mobility.   Therapy Documentation Precautions:  Precautions Precautions: Fall Precaution Comments: R hemi, R inattention Restrictions Weight Bearing Restrictions: No   See Function Navigator for Current Functional Status.  Therapy/Group: Individual Therapy  Waunita Schooner 02/10/2018, 9:48 AM

## 2018-02-10 NOTE — Progress Notes (Signed)
Physical Therapy Session Note  Patient Details  Name: Adam Yang MRN: [id number] Date of Birth: Feb 26, 1946  Today's Date: 02/10/2018 PT Individual Time: 1130-1200 PT Individual Time Calculation (min): 30 min    Skilled Therapeutic Interventions/Progress Updates: Pt received in w/c in day room, wife present. No evidence of pain. Instructed pt's wife in LE ROM, educated regarding purpose, duration/frequency, and provided handout with pictures and written instructions to allow performance independently and for wife to teach daughter next time she comes to visit. Performed gastroc, soleus, hallux dorsiflexion BLE x1 min each. Pt's wife concerned about new rash on LEs/hands; PA present during session to assess and discuss with wife. Returned to room totalA; remained seated in w/c at end of session, all needs in reach.      Therapy Documentation Precautions:  Precautions Precautions: Fall Precaution Comments: R hemi, R inattention Restrictions Weight Bearing Restrictions: No  See Function Navigator for Current Functional Status.   Therapy/Group: Individual Therapy  Corliss Skains 02/10/2018, 12:13 PM

## 2018-02-10 NOTE — Progress Notes (Signed)
Speech Language Pathology Weekly Progress and Session Note  Patient Details  Name: Adam Yang MRN: [id number] Date of Birth: Oct 24, 1945  Beginning of progress report period: February 03, 2018 End of progress report period: February 10, 2018  Today's Date: 02/10/2018 SLP Individual Time: 1350-1420 SLP Individual Time Calculation (min): 30 min  Short Term Goals: Week 1: SLP Short Term Goal 1 (Week 1): Pt will utilize multimodal means to communicate basic wants and needs in 75% of opportunties with Mod A cues.  SLP Short Term Goal 1 - Progress (Week 1): Not met SLP Short Term Goal 2 (Week 1): Pt answer yes/no questions using multimodal communication in 5 out of 10 opportunities with Max A cues.  SLP Short Term Goal 2 - Progress (Week 1): Not met SLP Short Term Goal 3 (Week 1): Pt will imitate common sounds/ phrases with multimodal cues and Max A level of support.  SLP Short Term Goal 3 - Progress (Week 1): Not met SLP Short Term Goal 4 (Week 1): Pt will complete basic problem solving tasks related to ADLs with Min A cues.  SLP Short Term Goal 4 - Progress (Week 1): Not met SLP Short Term Goal 5 (Week 1): Pt will perform pharyngeal strengthening exercises with Max A cues.  SLP Short Term Goal 5 - Progress (Week 1): Not met SLP Short Term Goal 6 (Week 1): Given oral stim, pt will initiate timely swallow with Max A cues to perserve function and for participation in dysphagia therpay.  SLP Short Term Goal 6 - Progress (Week 1): Not met    New Short Term Goals: Week 2: SLP Short Term Goal 1 (Week 2): Pt will utilize multimodal means to communicate basic wants and needs in 75% of opportunties with Mod A cues.  SLP Short Term Goal 2 (Week 2): Pt answer yes/no questions using multimodal communication in 5 out of 10 opportunities with Max A cues.  SLP Short Term Goal 3 (Week 2): Pt will imitate common sounds/ phrases with multimodal cues and Max A level of support.  SLP Short Term Goal 4 (Week 2): Pt  will complete basic problem solving tasks related to ADLs with Min A cues.  SLP Short Term Goal 5 (Week 2): Pt will perform pharyngeal strengthening exercises with Max A cues.  SLP Short Term Goal 6 (Week 2): Given oral stim, pt will initiate timely swallow with Max A cues to perserve function and for participation in dysphagia therpay.   Weekly Progress Updates: Pt has made minimal progress this reporting period d/t increased fatigue/lethargy. As a result pt has not met any of his STGs and they remain appropriate for this reporting period as pt continues to be severely aphasic and unable to communicate wants and needs. Skilled ST is required to increase this ability.      Intensity: Minumum of 1-2 x/day, 30 to 90 minutes Frequency: 3 to 5 out of 7 days Duration/Length of Stay: 21 to 24 days Treatment/Interventions: Functional tasks;Multimodal communication approach;Patient/family education;Therapeutic Activities;Oral motor exercises;Internal/external aids;Speech/Language facilitation   Daily Session  Skilled Therapeutic Interventions: Skilled treatment session focused on communication. SLP received pt upright in wheelchair, awake and alert. SLP facilitated session by providing Total A cues to simultaneously sing common songs with SLP ([place] Shirlee [place], [place] me, Happy Birthday, Days of the Week). Pt with increased speech patterns and sustained voicing throughout songs. Despite Total A cues, pt was not able to produce any vowels/consonants during naming tasks. Pt was left upright in wheelchair with  all safety measures in place and all needs within reach. Nursing outside of pt's door. Continue per current plan of care.      Function:     Cognition Comprehension Comprehension assist level: Understands basic 25 - 49% of the time/ requires cueing 50 - 75% of the time  Expression   Expression assist level: Expresses basis less than 25% of the time/requires cueing >75% of the time.  Social  Interaction Social Interaction assist level: Interacts appropriately less than 25% of the time. May be withdrawn or combative.  Problem Solving Problem solving assist level: Solves basic less than 25% of the time - needs direction nearly all the time or does not effectively solve problems and may need a restraint for safety  Memory Memory assist level: Recognizes or recalls less than 25% of the time/requires cueing greater than 75% of the time   General    Pain Pain Assessment Pain Scale: Faces Pain Score: 2  Faces Pain Scale: Hurts a little bit  Therapy/Group: Individual Therapy  Boluwatife Flight 02/10/2018, 2:22 PM

## 2018-02-10 NOTE — Progress Notes (Signed)
Subjective/Complaints: Up in bed. No problems overnight.   ROS: limited due to language/communication   Objective: Vital Signs: Blood pressure (!) 159/75, pulse 82, temperature 99.5 F (37.5 C), temperature source Oral, resp. rate 18, height '5\' 9"'$  (1.753 m), weight 59.2 kg (130 lb 8.7 oz), SpO2 94 %. No results found. Results for orders placed or performed during the hospital encounter of 02/02/18 (from the past 72 hour(s))  Basic metabolic panel     Status: Abnormal   Collection Time: 02/08/18  5:55 AM  Result Value Ref Range   Sodium 142 135 - 145 mmol/L   Potassium 3.7 3.5 - 5.1 mmol/L   Chloride 109 98 - 111 mmol/L   CO2 26 22 - 32 mmol/L   Glucose, Bld 163 (H) 70 - 99 mg/dL   BUN 48 (H) 8 - 23 mg/dL   Creatinine, Ser 1.75 (H) 0.61 - 1.24 mg/dL   Calcium 8.4 (L) 8.9 - 10.3 mg/dL   GFR calc non Af Amer 38 (L) >60 mL/min   GFR calc Af Amer 44 (L) >60 mL/min    Comment: (NOTE) The eGFR has been calculated using the CKD EPI equation. This calculation has not been validated in all clinical situations. eGFR's persistently <60 mL/min signify possible Chronic Kidney Disease.    Anion gap 7 5 - 15    Comment: Performed at [facility] [address] [place], [place] [address]  Basic metabolic panel     Status: Abnormal   Collection Time: 02/09/18  3:36 AM  Result Value Ref Range   Sodium 141 135 - 145 mmol/L   Potassium 3.8 3.5 - 5.1 mmol/L   Chloride 106 98 - 111 mmol/L   CO2 28 22 - 32 mmol/L   Glucose, Bld 146 (H) 70 - 99 mg/dL   BUN 39 (H) 8 - 23 mg/dL   Creatinine, Ser 1.58 (H) 0.61 - 1.24 mg/dL   Calcium 8.5 (L) 8.9 - 10.3 mg/dL   GFR calc non Af Amer 43 (L) >60 mL/min   GFR calc Af Amer 49 (L) >60 mL/min    Comment: (NOTE) The eGFR has been calculated using the CKD EPI equation. This calculation has not been validated in all clinical situations. eGFR's persistently <60 mL/min signify possible Chronic Kidney Disease.    Anion gap 7 5 - 15    Comment:  Performed at [facility] [address] [place], [place] [address]  Basic metabolic panel     Status: Abnormal   Collection Time: 02/10/18  5:49 AM  Result Value Ref Range   Sodium 138 135 - 145 mmol/L   Potassium 3.9 3.5 - 5.1 mmol/L   Chloride 101 98 - 111 mmol/L   CO2 27 22 - 32 mmol/L   Glucose, Bld 164 (H) 70 - 99 mg/dL   BUN 35 (H) 8 - 23 mg/dL   Creatinine, Ser 1.59 (H) 0.61 - 1.24 mg/dL   Calcium 8.6 (L) 8.9 - 10.3 mg/dL   GFR calc non Af Amer 42 (L) >60 mL/min   GFR calc Af Amer 49 (L) >60 mL/min    Comment: (NOTE) The eGFR has been calculated using the CKD EPI equation. This calculation has not been validated in all clinical situations. eGFR's persistently <60 mL/min signify possible Chronic Kidney Disease.    Anion gap 10 5 - 15    Comment: Performed at [facility] [address], [place], [place] [address]    Constitutional: No distress .  Vital signs reviewed. HEENT: EOMI, oral membranes moist Neck: supple Cardiovascular: RRR without murmur. No JVD    Respiratory: CTA Bilaterally without wheezes or rales. Normal effort    GI: BS +, Mild tender, PEG  Skin:   Intact. Warm and dry. Neuro: alert, garbled speech, global aphasia. Tends to right side somewhat. Motor:   0/5 Right side Musc/Skel:  No edema or tenderness in extremities Psych: flat  Assessment/Plan: 1. 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 Physiatrist is providing close team supervision and 24 hour management of active medical problems listed below. Physiatrist and rehab team continue to assess barriers to discharge/monitor patient progress toward functional and medical goals. FIM: Function - Bathing Position: Bed(UB seated EOB, LB bed level) Body parts bathed by patient: Chest,  Abdomen Body parts bathed by helper: Right arm, Left arm, Front perineal area, Buttocks, Right upper leg, Left upper leg, Right lower leg, Left lower leg, Back, Chest, Abdomen Assist Level: 2 helpers  Function- Upper Body Dressing/Undressing What is the patient wearing?: Pull over shirt/dress Pull over shirt/dress - Perfomed by helper: Thread/unthread right sleeve, Thread/unthread left sleeve, Put head through opening, Pull shirt over trunk Button up shirt - Perfomed by helper: Thread/unthread right sleeve, Thread/unthread left sleeve, Button/unbutton shirt, Pull shirt around back Assist Level: 2 helpers Function - Lower Body Dressing/Undressing What is the patient wearing?: Pants, Non-skid slipper socks Position: Bed Pants- Performed by helper: Thread/unthread right pants leg, Thread/unthread left pants leg, Pull pants up/down Non-skid slipper socks- Performed by helper: Don/doff right sock, Don/doff left sock Assist for footwear: Dependant Assist for lower body dressing: (total assist)  Function - Toileting Toileting activity did not occur: No continent bowel/bladder event Assist level: Two helpers  Function - Air cabin crew transfer activity did not occur: Safety/medical concerns  Function - Chair/bed transfer Chair/bed transfer method: Lateral scoot Chair/bed transfer assist level: 2 helpers Chair/bed transfer assistive device: Sliding board Mechanical lift: Stedy Chair/bed transfer details: Tactile cues for initiation, Tactile cues for weight shifting, Tactile cues for posture, Tactile cues for sequencing, Tactile cues for placement, Verbal cues for technique, Verbal cues for sequencing, Manual facilitation for weight shifting, Manual facilitation for placement  Function - Locomotion: Wheelchair Will patient use wheelchair at discharge?: Yes Wheelchair activity did not occur: Safety/medical concerns Assist Level: Dependent (Pt equals 0%) Wheel 50 feet with 2 turns  activity did not occur: Safety/medical concerns Wheel 150 feet activity did not occur: Safety/medical concerns Turns around,maneuvers to table,bed, and toilet,negotiates 3% grade,maneuvers on rugs and over doorsills: No Function - Locomotion: Ambulation Assistive device: (3 muskateers + w/c follow) Max distance: 3 ft Assist level: 2 helpers Walk 10 feet activity did not occur: Safety/medical concerns Walk 50 feet with 2 turns activity did not occur: Safety/medical concerns Walk 150 feet activity did not occur: Safety/medical concerns Walk 10 feet on uneven surfaces activity did not occur: Safety/medical concerns  Function - Comprehension Comprehension: Auditory Comprehension assist level: Understands basic 25 - 49% of the time/ requires cueing 50 - 75% of the time  Function - Expression Expression: Verbal Expression assistive device: Other (Comment) Expression assist level: Expresses basis less than 25% of the time/requires cueing >75% of the time.  Function - Social Interaction Social Interaction assist level: Interacts appropriately less than 25% of the time. May be withdrawn or combative.  Function - Problem Solving Problem  solving assist level: Solves basic less than 25% of the time - needs direction nearly all the time or does not effectively solve problems and may need a restraint for safety  Function - Memory Memory assist level: Recognizes or recalls less than 25% of the time/requires cueing greater than 75% of the time Patient normally able to recall (first 3 days only): None of the above  Medical Problem List and Plan: 1.Right side weakness with facial droop and dysphasiasecondary to large left frontoparietal hemorrhagic infarction with vasogenic edema and mass-effect  Continue CIR  -therapy decr to 15/7 due to poor therapy tolerance  -lethargy with improvement today   -continue ritalin, other changes noted below 2. DVT Prophylaxis/Anticoagulation: SCDs. Monitor  for any signs of DVT 3. Pain Management:Zanaflex 4 mg nightly--dc'ed due to am lethargy 4. Mood:Provide emotional support  -mood component to lethargy 5. Neuropsych: This patientis notcapable of making decisions on hisown behalf. 6. Skin/Wound Care:Routine skin checks 7. Fluids/Electrolytes/Nutrition:Routine in and outs 8.Dysphagia: PEG placed 7/28 by INR  -follow for healing 9.Hypertension.   Lisinopril 20 mg daily, Norvasc 10 mg daily. Monitor with increased mobility  Fair control 8/1 10.Diabetes mellitus. Hemoglobin A1c 6.0.   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 12.Hyperlipidemia. Fenofibrate. 13. Hypernatremia  Sodium has improved to 138 8/2  Free water flushes increased   Recheck labs monday 14. AKI on CKD  BUN/Creatinine with gradual improvement===>39/1.58==>35/1.59  -recheck monday  -off IVF since 7/31   -H20 flushes 200cc q3 15. Anemia of chronic disease  Hemoglobin 12.4 on 7/30  Cont to monitor 6. Transaminitis  resolved  LOS (Days) 8 A FACE TO FACE EVALUATION WAS PERFORMED  Meredith Staggers 02/10/2018, 10:56 AM

## 2018-02-10 NOTE — Progress Notes (Signed)
Social Work Patient ID: Adam Yang, male   DOB: November 01, 1945, 72 y.o.   MRN: [id number]   CSW met with pt's wife and dtr extensively on 02-09-18 to update them on team conference discussion and targeted d/c date of 03-01-18.  They are concerned that pt will not be ready by then and he is likely not eligible for SNF tx due to insurance.  They worry if they can care for pt at home and what would be other options.  Family could hire care or pay privately (or long term care insurance) for SNF, but CSW could not explain these options at this time due to family having other concerns to discuss.  Several items for possible changes to pt's success in therapy were discussed and CSW met with nursing director and therapy director re: these and asked Marlowe Shores, PA if he could address medical questions/concerns with family, which he did.  CSW also sent email to the team to also list these suggestions.  Pt's assigned CSW, Ovidio Kin, to assume the case back on 02-13-18 and this CSW will provide report.  CSW team will continue to follow and support pt/family and begin to prepare them for home d/c.

## 2018-02-10 NOTE — Progress Notes (Signed)
Physical Therapy Session Note  Patient Details  Name: Adam Yang MRN: [id number] Date of Birth: 09-21-1945  Today's Date: 02/10/2018 PT Individual Time: 3568-6168 PT Individual Time Calculation (min): 29 min   Short Term Goals: Week 1:  PT Short Term Goal 1 (Week 1): Pt will complete bed<>w/c with mod assist +1. PT Short Term Goal 1 - Progress (Week 1): Not met PT Short Term Goal 2 (Week 1): Pt will propel w/c 25 ft with min assist. PT Short Term Goal 2 - Progress (Week 1): Not met PT Short Term Goal 3 (Week 1): Pt will complete bed mobility with mod assist. PT Short Term Goal 3 - Progress (Week 1): Not met  Skilled Therapeutic Interventions/Progress Updates:  Pt received in w/c with wife Tye [place]) present and actively involved in session. Transported pt in w/c via total assist for time management. Pt transferred to standing in standing frame and tolerated activity for ~10-12 minutes focusing on BLE weight bearing for R NMR & strengthening. Therapist provides total assist and cuing for midline orientation and posture. Pt requires total assist, hand over hand, to follow and execute 1 step commands even with LUE. At end of session pt left in TIS w/c in dayroom with wife supervising.  Therapy Documentation Precautions:  Precautions Precautions: Fall Precaution Comments: R hemi, R inattention Restrictions Weight Bearing Restrictions: No  See Function Navigator for Current Functional Status.   Therapy/Group: Individual Therapy  Waunita Schooner 02/10/2018, 11:17 AM

## 2018-02-10 NOTE — Progress Notes (Signed)
Occupational Therapy Weekly Progress Note  Patient Details  Name: Corbitt Cloke MRN: [id number] Date of Birth: [birth year]  Beginning of progress report period: February 03, 2018 End of progress report period: February 10, 2018  Today's Date: 02/10/2018 OT Individual Time: 3335-4562 OT Individual Time Calculation (min): 60 min    Patient has met 0 of 4 short term goals.  Pt has made very little progress towards goals as he is limited by lethargy and decreased activity tolerance.  Pt's family and SWK have made recommendations based on pt previous wake cycle to attempt to increase arousal and participation - will begin to implement this coming week.  Pt continues to demonstrate significant Rt inattention, decreased sitting balance, pushing tendencies, all impacting his ability to engage in self-care tasks and transfers. Pt currently requires max-total assist for bed mobility and +2 for any self-care tasks at bed level.  Pt's schedule has been modified to 15/7 to increase quality of participation.  Patient continues to demonstrate the following deficits: muscle weakness, decreased cardiorespiratoy endurance, abnormal tone and unbalanced muscle activation, decreased visual perceptual skills, decreased midline orientation and right side neglect, decreased attention, decreased awareness, decreased problem solving and decreased memory and decreased sitting balance, decreased standing balance, decreased postural control, hemiplegia and decreased balance strategies and therefore will continue to benefit from skilled OT intervention to enhance overall performance with BADL and Reduce care partner burden.  Patient progressing toward long term goals..  Continue plan of care.  OT Short Term Goals Week 1:  OT Short Term Goal 1 (Week 1): Pt will complete sit > stand with mod assist to decrease burden of care with LB bathing/dressing OT Short Term Goal 1 - Progress (Week 1): Not met OT Short Term Goal 2 (Week 1): Pt  will complete bathing with max assist with mod cues for sequencing OT Short Term Goal 2 - Progress (Week 1): Not met OT Short Term Goal 3 (Week 1): Pt will complete UB dressing with max assist OT Short Term Goal 3 - Progress (Week 1): Not met OT Short Term Goal 4 (Week 1): Pt will complete toilet transfer with max assist of 1 caregiver OT Short Term Goal 4 - Progress (Week 1): Not met Week 2:  OT Short Term Goal 1 (Week 2): Pt will complete sit > stand with mod assist to decrease burden of care with LB bathing/dressing OT Short Term Goal 2 (Week 2): Pt will complete bathing with max assist with mod cues for sequencing OT Short Term Goal 3 (Week 2): Pt will complete UB dressing with max assist OT Short Term Goal 4 (Week 2): Pt will complete toilet transfer with max assist of 1 caregiver  Skilled Therapeutic Interventions/Progress Updates:    Treatment session with focus on attention to task and following one step commands to participate in self-care tasks.  Pt received supine in bed easily aroused to voice.  Pt incontinent of bowel upon arrival, engaged in hygiene at bed level with focus on participating in rolling.  Pt able to follow command to roll to Rt with hand over hand assist to place Lt hand across body on bed rail and tactile cues at hips, pt initiated rolling with mod-max assist of 1 caregiver.  Able to complete hygiene with pt tolerating lying in sidelying.  Completed sidelying to sitting with mod-max assist of 1 caregiver this session with cues for Lt hand placement to minimize pushing.  Completed squat pivot transfer +2 total assist with cues to keep Lt hand  in lap to minimize pushing.  Once upright in w/c, completed UB bathing and dressing at sink with pt demonstrating ability to follow one step commands to assist therapist with donning shirt.  Hand over hand to initiate washing chest with pt able to complete.  Engaged in sit > stand x1 with max assist, pt initiating hand placement with cues  and increased wait time. Pt left reclined in w/c at nursing station for increased arousal and supervision.   Therapy Documentation Precautions:  Precautions Precautions: Fall Precaution Comments: R hemi, R inattention Restrictions Weight Bearing Restrictions: No General:   Vital Signs: Therapy Vitals Temp: 99.5 F (37.5 C) Temp Source: Oral Pulse Rate: 82 Resp: 18 BP: (!) 159/75 Patient Position (if appropriate): Lying Oxygen Therapy SpO2: 94 % O2 Device: Room Air Pain:   Pt with no c/o pain  See Function Navigator for Current Functional Status.   Therapy/Group: Individual Therapy  Simonne Come 02/10/2018, 7:34 AM

## 2018-02-11 ENCOUNTER — Inpatient Hospital Stay (HOSPITAL_COMMUNITY): Payer: Medicare Other | Admitting: Physical Therapy

## 2018-02-11 ENCOUNTER — Inpatient Hospital Stay (HOSPITAL_COMMUNITY): Payer: Medicare Other

## 2018-02-11 NOTE — Plan of Care (Signed)
  Problem: Consults Goal: RH STROKE PATIENT EDUCATION Description See Patient Education module for education specifics  Outcome: Progressing Goal: Nutrition Consult-if indicated Outcome: Progressing Goal: Diabetes Guidelines if Diabetic/Glucose > 140 Description If diabetic or lab glucose is > 140 mg/dl - Initiate Diabetes/Hyperglycemia Guidelines & Document Interventions  Outcome: Progressing   Problem: RH SKIN INTEGRITY Goal: RH STG SKIN FREE OF INFECTION/BREAKDOWN Outcome: Progressing Goal: RH STG MAINTAIN SKIN INTEGRITY WITH ASSISTANCE Description STG Maintain Skin Integrity With min Assistance.  Outcome: Progressing   Problem: RH SAFETY Goal: RH STG ADHERE TO SAFETY PRECAUTIONS W/ASSISTANCE/DEVICE Description STG Adhere to Safety Precautions With min Assistance/Device.  Outcome: Progressing Goal: RH STG DECREASED RISK OF FALL WITH ASSISTANCE Description STG Decreased Risk of Fall With min Assistance.  Outcome: Progressing Goal: RH STG DEMO UNDERSTANDING HOME SAFETY PRECAUTIONS Outcome: Progressing   Problem: RH PAIN MANAGEMENT Goal: RH STG PAIN MANAGED AT OR BELOW PT'S PAIN GOAL Description Less than 3 out of 10   Outcome: Progressing   Problem: RH Vision Goal: RH LTG Vision (Specify) Outcome: Progressing   Problem: RH BOWEL ELIMINATION Goal: RH STG MANAGE BOWEL WITH ASSISTANCE Description STG Manage Bowel with min Assistance.  Outcome: Not Progressing Goal: RH STG MANAGE BOWEL W/MEDICATION W/ASSISTANCE Description STG Manage Bowel with Medication with min Assistance.  Outcome: Not Progressing   Problem: RH BLADDER ELIMINATION Goal: RH STG MANAGE BLADDER WITH ASSISTANCE Description STG Manage Bladder With min Assistance  Outcome: Not Progressing Goal: RH STG MANAGE BLADDER WITH MEDICATION WITH ASSISTANCE Description STG Manage Bladder With Medication With min Assistance.  Outcome: Not Progressing   Problem: RH COGNITION-NURSING Goal: RH STG USES  MEMORY AIDS/STRATEGIES W/ASSIST TO PROBLEM SOLVE Description STG Uses Memory Aids/Strategies With Assistance to Problem Solve. Outcome: Not Progressing Goal: RH STG ANTICIPATES NEEDS/CALLS FOR ASSIST W/ASSIST/CUES Description STG Anticipates Needs/Calls for Assist With min Assistance/Cues.  Outcome: Not Progressing   Problem: RH KNOWLEDGE DEFICIT Goal: RH STG INCREASE KNOWLEDGE OF DIABETES Outcome: Not Progressing Goal: RH STG INCREASE KNOWLEDGE OF HYPERTENSION Outcome: Not Progressing Goal: RH STG INCREASE KNOWLEDGE OF DYSPHAGIA/FLUID INTAKE Outcome: Not Progressing

## 2018-02-11 NOTE — Progress Notes (Signed)
Subjective/Complaints: Patient without issues overnight.  Was resting comfortably when I arrived this morning.  ROS: Patient denies fever, rash, sore throat, blurred vision, nausea, vomiting, diarrhea, cough, shortness of breath or chest pain, joint or back pain, headache, or mood change.    Objective: Vital Signs: Blood pressure (!) 164/93, pulse 92, temperature 97.7 F (36.5 C), temperature source Oral, resp. rate 16, height '5\' 9"'$  (1.753 m), weight 60.6 kg (133 lb 9.6 oz), SpO2 95 %. No results found. Results for orders placed or performed during the hospital encounter of 02/02/18 (from the past 72 hour(s))  Basic metabolic panel     Status: Abnormal   Collection Time: 02/09/18  3:36 AM  Result Value Ref Range   Sodium 141 135 - 145 mmol/L   Potassium 3.8 3.5 - 5.1 mmol/L   Chloride 106 98 - 111 mmol/L   CO2 28 22 - 32 mmol/L   Glucose, Bld 146 (H) 70 - 99 mg/dL   BUN 39 (H) 8 - 23 mg/dL   Creatinine, Ser 1.58 (H) 0.61 - 1.24 mg/dL   Calcium 8.5 (L) 8.9 - 10.3 mg/dL   GFR calc non Af Amer 43 (L) >60 mL/min   GFR calc Af Amer 49 (L) >60 mL/min    Comment: (NOTE) The eGFR has been calculated using the CKD EPI equation. This calculation has not been validated in all clinical situations. eGFR's persistently <60 mL/min signify possible Chronic Kidney Disease.    Anion gap 7 5 - 15    Comment: Performed at [facility] [address], [place], [place] [address]  Basic metabolic panel     Status: Abnormal   Collection Time: 02/10/18  5:49 AM  Result Value Ref Range   Sodium 138 135 - 145 mmol/L   Potassium 3.9 3.5 - 5.1 mmol/L   Chloride 101 98 - 111 mmol/L   CO2 27 22 - 32 mmol/L   Glucose, Bld 164 (H) 70 - 99 mg/dL   BUN 35 (H) 8 - 23 mg/dL   Creatinine, Ser 1.59 (H) 0.61 - 1.24 mg/dL   Calcium 8.6 (L) 8.9 - 10.3 mg/dL   GFR calc non Af Amer 42 (L) >60 mL/min   GFR calc Af Amer 49 (L) >60 mL/min    Comment: (NOTE) The eGFR has been calculated using the CKD EPI  equation. This calculation has not been validated in all clinical situations. eGFR's persistently <60 mL/min signify possible Chronic Kidney Disease.    Anion gap 10 5 - 15    Comment: Performed at [facility] [address], [place], [place] [address]    Constitutional: No distress . Vital signs reviewed. HEENT: EOMI, oral membranes moist Neck: supple Cardiovascular: RRR without murmur. No JVD    Respiratory: CTA Bilaterally without wheezes or rales. Normal effort    GI: BS +, PEG in place with mild tenderness surrounding.  No drainage Skin:   Intact. Warm and dry. Neuro: Awakens easily, garbled speech, global aphasia. Tends to right side somewhat. Motor:   0/5 Right side Musc/Skel:  No edema or tenderness in extremities Psych: flat  Assessment/Plan: 1. 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 Physiatrist is providing close team supervision and 24 hour management of active medical problems listed below. Physiatrist and rehab team continue to assess barriers to discharge/monitor patient progress toward functional  and medical goals. FIM: Function - Bathing Position: Bed(UB seated EOB, LB bed level) Body parts bathed by patient: Chest, Abdomen Body parts bathed by helper: Right arm, Left arm, Front perineal area, Buttocks, Right upper leg, Left upper leg, Right lower leg, Left lower leg, Back, Chest, Abdomen Assist Level: 2 helpers  Function- Upper Body Dressing/Undressing What is the patient wearing?: Pull over shirt/dress Pull over shirt/dress - Perfomed by helper: Thread/unthread right sleeve, Thread/unthread left sleeve, Put head through opening, Pull shirt over trunk Button up shirt - Perfomed by helper: Thread/unthread right sleeve, Thread/unthread left sleeve,  Button/unbutton shirt, Pull shirt around back Assist Level: 2 helpers Function - Lower Body Dressing/Undressing What is the patient wearing?: Pants, Non-skid slipper socks Position: Bed Pants- Performed by helper: Thread/unthread right pants leg, Thread/unthread left pants leg, Pull pants up/down Non-skid slipper socks- Performed by helper: Don/doff right sock, Don/doff left sock Assist for footwear: Dependant Assist for lower body dressing: (total assist)  Function - Toileting Toileting activity did not occur: No continent bowel/bladder event Assist level: Two helpers  Function - Air cabin crew transfer activity did not occur: Safety/medical concerns  Function - Chair/bed transfer Chair/bed transfer method: Lateral scoot Chair/bed transfer assist level: 2 helpers Chair/bed transfer assistive device: Sliding board Mechanical lift: Stedy Chair/bed transfer details: Tactile cues for initiation, Tactile cues for weight shifting, Tactile cues for posture, Tactile cues for sequencing, Tactile cues for placement, Verbal cues for technique, Verbal cues for sequencing, Manual facilitation for weight shifting, Manual facilitation for placement  Function - Locomotion: Wheelchair Will patient use wheelchair at discharge?: Yes Wheelchair activity did not occur: Safety/medical concerns Assist Level: Dependent (Pt equals 0%) Wheel 50 feet with 2 turns activity did not occur: Safety/medical concerns Wheel 150 feet activity did not occur: Safety/medical concerns Turns around,maneuvers to table,bed, and toilet,negotiates 3% grade,maneuvers on rugs and over doorsills: No Function - Locomotion: Ambulation Assistive device: (3 muskateers + w/c follow) Max distance: 3 ft Assist level: 2 helpers Walk 10 feet activity did not occur: Safety/medical concerns Walk 50 feet with 2 turns activity did not occur: Safety/medical concerns Walk 150 feet activity did not occur: Safety/medical  concerns Walk 10 feet on uneven surfaces activity did not occur: Safety/medical concerns  Function - Comprehension Comprehension: Auditory Comprehension assist level: Understands basic 25 - 49% of the time/ requires cueing 50 - 75% of the time  Function - Expression Expression: Verbal Expression assistive device: Other (Comment) Expression assist level: Expresses basis less than 25% of the time/requires cueing >75% of the time.  Function - Social Interaction Social Interaction assist level: Interacts appropriately less than 25% of the time. May be withdrawn or combative.  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 DVT Prophylaxis/Anticoagulation: SCDs. Monitor for any signs of DVT 3. Pain Management:Zanaflex 4 mg nightly--changed  to as needed due to morning lethargy 4. Mood:Provide emotional support  -Likely some reactive depression but difficult to assess given his cognitive linguistic deficits---> consider antidepressant 5. Neuropsych: This patientis notcapable of making decisions on hisown behalf. 6. Skin/Wound Care:Routine skin checks 7. Fluids/Electrolytes/Nutrition:Routine in and outs 8.Dysphagia: PEG placed 7/28 by INR  -follow for  healing 9.Hypertension.   Lisinopril 20 mg daily, Norvasc 10 mg daily. Monitor with increased mobility  Fair control 8/1 10.Diabetes mellitus. Hemoglobin A1c 6.0.   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 12.Hyperlipidemia. Fenofibrate. 13. Hypernatremia  Sodium  improved to 138 8/2  Free water flushes have been increased   Recheck labs monday 14. AKI on CKD  BUN/Creatinine with gradual improvement===>39/1.58==>35/1.59  -recheck monday  -off IVF since 7/31   -H20 flushes currently 200cc q3 15. Anemia of chronic disease  Hemoglobin 12.4 on 7/30  Cont to monitor 6. Transaminitis  resolved  LOS (Days) 9 A FACE TO FACE EVALUATION WAS PERFORMED  Meredith Staggers 02/11/2018, 8:24 AM

## 2018-02-11 NOTE — Progress Notes (Signed)
Physical Therapy Session Note  Patient Details  Name: Adam Yang MRN: [id number] Date of Birth: 03-31-1946  Today's Date: 02/11/2018 PT Individual Time: 1505-1605 PT Individual Time Calculation (min): 60 min   Short Term Goals: Week 2:  PT Short Term Goal 1 (Week 2): Pt will complete bed<>w/c with max assist +1. PT Short Term Goal 2 (Week 2): Pt will complete bed mobility with mod assist overall. PT Short Term Goal 3 (Week 2): Pt will initiate w/c mobility.  Skilled Therapeutic Interventions/Progress Updates:   Pt asleep in supine upon arrival, max stimulation to wake up and agreeable to therapy. Pt's brief noted to be soiled. Max assist to perform rolling in both directions, min assist to maintain sidelying position while therapist and 2nd helper performed brief change and pericare. Pt able to initiate functional movements w/ min verbal and tactile cues. Transferred to EOB w/ max assist x1 and to w/c via squat pivot, total assist x2 helpers. Verbal and tactile cues for posture and technique of transfer. Total assist w/c transport to/from day room. Attempted to work on standing tolerance and upright posture at standing frame, however pt moaning and unable to keep eyes open to attend to task of static standing w/ BUE support. Manual and verbal cues for anterior weight shifting and midline orientation. BP 161/86 w/ first stand for 30-60 sec, moaning continued once returning to seated but decreased. Pt able to confirm that he was dizzy in standing, but denied pain. Performed 2nd stand w/ increased moaning and pt unable to maintain upright posture despite max facilitation, BP 143/100. RN made aware of vital signs and ended session in w/c and in care of RN at nurses station.   Therapy Documentation Precautions:  Precautions Precautions: Fall Precaution Comments: R hemi, R inattention Restrictions Weight Bearing Restrictions: No Vital Signs: Therapy Vitals Temp: 98.9 F (37.2 C) Temp Source:  Oral Pulse Rate: 95 BP: (!) 143/100 Patient Position (if appropriate): Sitting Oxygen Therapy SpO2: 96 % O2 Device: Room Air  See Function Navigator for Current Functional Status.   Therapy/Group: Individual Therapy  Bernarda Erck K Arnette 02/11/2018, 4:04 PM

## 2018-02-11 NOTE — Progress Notes (Signed)
Occupational Therapy Session Note  Patient Details  Name: Adam Yang MRN: [id number] Date of Birth: [birth year]  Today's Date: 02/11/2018 OT Individual Time: 0930-1030 OT Individual Time Calculation (min): 60 min    Short Term Goals: Week 2:  OT Short Term Goal 1 (Week 2): Pt will complete sit > stand with mod assist to decrease burden of care with LB bathing/dressing OT Short Term Goal 2 (Week 2): Pt will complete bathing with max assist with mod cues for sequencing OT Short Term Goal 3 (Week 2): Pt will complete UB dressing with max assist OT Short Term Goal 4 (Week 2): Pt will complete toilet transfer with max assist of 1 caregiver  Skilled Therapeutic Interventions/Progress Updates:    Pt asleep in in bed upon arrival but easily aroused with wet wash cloth.  Nursing staff completed LB bathing prior to therapy.  Pt engaged in LB dressing tasks at bed level.  Pt required tot A to thread pants but assisted with pulling over hips while bridging. Pt initiated pulling pants over hips but required assistance to complete task. Pt required max A for sitting EOB and squat pivot transfer to w/c.  Pt engaged in UB bathing/dressing tasks seated in w/c at sink. Pt required max multimodal cues to initiate bathing tasks and to transition. Pt required tot A for UB dressing tasks.  Treatment focus transitioned to table tasks with focus on attending to and locating familiar objects on R. Pt required tot A to complete task. Pt remained in w/c and returned to nursing station.   Therapy Documentation Precautions:  Precautions Precautions: Fall Precaution Comments: R hemi, R inattention Restrictions Weight Bearing Restrictions: No   Pain: Pain Assessment Pain Scale: 0-10 Pain Score: 0-No pain    See Function Navigator for Current Functional Status.   Therapy/Group: Individual Therapy  Leroy Libman 02/11/2018, 10:33 AM

## 2018-02-11 NOTE — Progress Notes (Signed)
Speech Language Pathology Daily Session Note  Patient Details  Name: Adam Yang MRN: [id number] Date of Birth: 08/20/1945  Today's Date: 02/11/2018 SLP Individual Time: 7782-4235 SLP Individual Time Calculation (min): 27 min  Short Term Goals: Week 2: SLP Short Term Goal 1 (Week 2): Pt will utilize multimodal means to communicate basic wants and needs in 75% of opportunties with Mod A cues.  SLP Short Term Goal 2 (Week 2): Pt answer yes/no questions using multimodal communication in 5 out of 10 opportunities with Max A cues.  SLP Short Term Goal 3 (Week 2): Pt will imitate common sounds/ phrases with multimodal cues and Max A level of support.  SLP Short Term Goal 4 (Week 2): Pt will complete basic problem solving tasks related to ADLs with Min A cues.  SLP Short Term Goal 5 (Week 2): Pt will perform pharyngeal strengthening exercises with Max A cues.  SLP Short Term Goal 6 (Week 2): Given oral stim, pt will initiate timely swallow with Max A cues to perserve function and for participation in dysphagia therpay.   Skilled Therapeutic Interventions:Skilled ST services focused on cognitive and swallow skills. Pt demonstrated increase in sustained attention compare to pervious treatment sessions. SLP facilitated response to yes/no questions pertaining to current wants/needs and biographical/immediate environment, pt demonstrated 60% with max A multimodal cues. Pt continued to attempt to vocalize, unintelligible and required max A verbal cues to utilizing yes/no response to perform effective communication. SLP provided oral care to facilitate initation of swallow function, pt demonstrated delayed swallow initation with Max A cues. Pt allows SLP to perform oral care utilizing multiple suction sponges to remove white substance from tongue, suspect thrush/dired secretion, nursing has been made aware. SLP facilitated non-resistant and resistant chin tuck exercise to aid in pharyngeal strengthening, however  pt unable to demonstrate. Pt was left in room with call bell within reach and bed alaram set. ST reccomends to continue skilled ST services.     Function:  Eating Eating                 Cognition Comprehension Comprehension assist level: Understands basic 25 - 49% of the time/ requires cueing 50 - 75% of the time  Expression   Expression assist level: Expresses basis less than 25% of the time/requires cueing >75% of the time.  Social Interaction Social Interaction assist level: Interacts appropriately 25 - 49% of time - Needs frequent redirection.  Problem Solving Problem solving assist level: Solves basic less than 25% of the time - needs direction nearly all the time or does not effectively solve problems and may need a restraint for safety  Memory Memory assist level: Recognizes or recalls less than 25% of the time/requires cueing greater than 75% of the time    Pain Pain Assessment Pain Score: 0-No pain  Therapy/Group: Individual Therapy  Burl Tauzin  [facility] 02/11/2018, 3:28 PM

## 2018-02-12 ENCOUNTER — Inpatient Hospital Stay (HOSPITAL_COMMUNITY): Payer: Medicare Other | Admitting: Physical Therapy

## 2018-02-12 ENCOUNTER — Inpatient Hospital Stay (HOSPITAL_COMMUNITY): Payer: Medicare Other | Admitting: Occupational Therapy

## 2018-02-12 ENCOUNTER — Inpatient Hospital Stay (HOSPITAL_COMMUNITY): Payer: Medicare Other

## 2018-02-12 MED ORDER — LISINOPRIL 20 MG PO TABS
30.0000 mg | ORAL_TABLET | Freq: Every day | ORAL | Status: DC
  Administered 2018-02-12: 30 mg via ORAL
  Filled 2018-02-12: qty 1

## 2018-02-12 NOTE — Progress Notes (Signed)
Occupational Therapy Session Note  Patient Details  Name: Stevens Magwood MRN: [id number] Date of Birth: 04/07/1946  Today's Date: 02/12/2018 OT Individual Time: 1000-1100 OT Individual Time Calculation (min): 60 min    Short Term Goals: Week 1:  OT Short Term Goal 1 (Week 1): Pt will complete sit > stand with mod assist to decrease burden of care with LB bathing/dressing OT Short Term Goal 1 - Progress (Week 1): Not met OT Short Term Goal 2 (Week 1): Pt will complete bathing with max assist with mod cues for sequencing OT Short Term Goal 2 - Progress (Week 1): Not met OT Short Term Goal 3 (Week 1): Pt will complete UB dressing with max assist OT Short Term Goal 3 - Progress (Week 1): Not met OT Short Term Goal 4 (Week 1): Pt will complete toilet transfer with max assist of 1 caregiver OT Short Term Goal 4 - Progress (Week 1): Not met Week 2:  OT Short Term Goal 1 (Week 2): Pt will complete sit > stand with mod assist to decrease burden of care with LB bathing/dressing OT Short Term Goal 2 (Week 2): Pt will complete bathing with max assist with mod cues for sequencing OT Short Term Goal 3 (Week 2): Pt will complete UB dressing with max assist OT Short Term Goal 4 (Week 2): Pt will complete toilet transfer with max assist of 1 caregiver  Skilled Therapeutic Interventions/Progress Updates:    Focus treatment on: unsupported sitting balance, bed mobiltiy, attention to right,  RUE positioning, following one step commands.    Ppt lying in bed.  Incontinent of bowel.  Addressed rolling to right (mod a) and to left   (total assist).  Pt followed one step commands during rolling with 50 % accuracy.  He bridged hips with mod assist.  Went from supine to sit with total +2.  Pt pushing to right side with back extensor muscles during sitting.  Place LUE on bar for pt to hold.  He maintained for 30 sec before letting go.  Sat EOB to don shirt with OT providing max tactile and physical cues for midline  control, a ttention to right, and motor planning.  Squat pivot transfer to wc with +2.   Transitioned pt to day room to dance group.    Therapy Documentation Precautions:  Precautions Precautions: Fall Precaution Comments: R hemi, R inattention Restrictions Weight Bearing Restrictions: No    Vital Signs: Therapy Vitals BP: (!) 147/81  Supine;    155/94  sitting Pain:  none   ADL:  See function measures      Perception  Inattention to right;     Praxis,  Decreased motor planning during dressing          See Function Navigator for Current Functional Status.   Therapy/Group: Individual Therapy  Lisa Roca 02/12/2018, 12:19 PM

## 2018-02-12 NOTE — Progress Notes (Signed)
Subjective/Complaints: Patient had uneventful night per nursing.  Was lying in bed and awake when I entered this morning  ROS: limited due to language/communication    Objective: Vital Signs: Blood pressure (!) 156/91, pulse 85, temperature 98.2 F (36.8 C), temperature source Oral, resp. rate 18, height '5\' 9"'$  (1.753 m), weight 60.6 kg (133 lb 9.6 oz), SpO2 97 %. No results found. Results for orders placed or performed during the hospital encounter of 02/02/18 (from the past 72 hour(s))  Basic metabolic panel     Status: Abnormal   Collection Time: 02/10/18  5:49 AM  Result Value Ref Range   Sodium 138 135 - 145 mmol/L   Potassium 3.9 3.5 - 5.1 mmol/L   Chloride 101 98 - 111 mmol/L   CO2 27 22 - 32 mmol/L   Glucose, Bld 164 (H) 70 - 99 mg/dL   BUN 35 (H) 8 - 23 mg/dL   Creatinine, Ser 1.59 (H) 0.61 - 1.24 mg/dL   Calcium 8.6 (L) 8.9 - 10.3 mg/dL   GFR calc non Af Amer 42 (L) >60 mL/min   GFR calc Af Amer 49 (L) >60 mL/min    Comment: (NOTE) The eGFR has been calculated using the CKD EPI equation. This calculation has not been validated in all clinical situations. eGFR's persistently <60 mL/min signify possible Chronic Kidney Disease.    Anion gap 10 5 - 15    Comment: Performed at [facility] [address] [place],  [address]    Constitutional: No distress . Vital signs reviewed. HEENT: EOMI, oral membranes moist Neck: supple Cardiovascular: RRR without murmur. No JVD    Respiratory: CTA Bilaterally without wheezes or rales. Normal effort    GI: BS +, non-tender, non-distended  Skin:   Intact. Warm and dry. Neuro: Globally aphasia, fairly alert.  tends to right side somewhat. Motor:   0/5 Right side Musc/Skel:  No edema or tenderness in extremities Psych: flat  Assessment/Plan: 1. 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 Physiatrist is providing close team supervision and 24 hour management of active medical problems listed below. Physiatrist and rehab team continue to assess barriers to discharge/monitor patient progress toward functional and medical goals. FIM: Function - Bathing Position: Bed Body parts bathed by patient: Chest, Abdomen, Right arm Body parts bathed by helper: Left arm, Front perineal area, Buttocks, Right upper leg, Left upper leg, Right lower leg, Left lower leg, Back Assist Level: 2 helpers  Function- Upper Body Dressing/Undressing What is the patient wearing?: Pull over shirt/dress Pull over shirt/dress - Perfomed by helper: Thread/unthread right sleeve, Thread/unthread left sleeve, Put head through opening, Pull shirt over trunk Button up shirt - Perfomed by helper: Thread/unthread right sleeve, Thread/unthread left sleeve, Button/unbutton shirt, Pull shirt around back Assist Level: 2 helpers Function - Lower Body Dressing/Undressing What is the patient wearing?: Pants, Non-skid slipper socks Position: Bed Pants- Performed by helper: Thread/unthread right pants leg, Thread/unthread left pants leg, Pull pants up/down Non-skid slipper socks- Performed by helper: Don/doff right sock, Don/doff left sock Assist for footwear: Dependant Assist for lower body dressing: 2 Helpers  Function - Toileting Toileting activity did not occur: No continent bowel/bladder event Assist level: Two helpers  Function - Air cabin crew transfer activity did not occur: Safety/medical concerns  Function - Chair/bed transfer Chair/bed transfer method: Squat pivot Chair/bed transfer  assist level: 2 helpers Chair/bed transfer assistive device: Armrests Mechanical lift: Stedy Chair/bed transfer details: Tactile cues for initiation, Tactile cues for weight shifting, Tactile cues for posture, Tactile cues for sequencing,  Tactile cues for placement, Verbal cues for technique, Verbal cues for sequencing, Manual facilitation for weight shifting, Manual facilitation for placement  Function - Locomotion: Wheelchair Will patient use wheelchair at discharge?: Yes Wheelchair activity did not occur: Safety/medical concerns Assist Level: Dependent (Pt equals 0%) Wheel 50 feet with 2 turns activity did not occur: Safety/medical concerns Wheel 150 feet activity did not occur: Safety/medical concerns Turns around,maneuvers to table,bed, and toilet,negotiates 3% grade,maneuvers on rugs and over doorsills: No Function - Locomotion: Ambulation Assistive device: (3 muskateers + w/c follow) Max distance: 3 ft Assist level: 2 helpers Walk 10 feet activity did not occur: Safety/medical concerns Walk 50 feet with 2 turns activity did not occur: Safety/medical concerns Walk 150 feet activity did not occur: Safety/medical concerns Walk 10 feet on uneven surfaces activity did not occur: Safety/medical concerns  Function - Comprehension Comprehension: Auditory Comprehension assist level: Understands basic 25 - 49% of the time/ requires cueing 50 - 75% of the time  Function - Expression Expression: Verbal, Nonverbal(speak garbled ) Expression assistive device: Other (Comment) Expression assist level: Expresses basis less than 25% of the time/requires cueing >75% of the time.  Function - Social Interaction Social Interaction assist level: Interacts appropriately 25 - 49% of time - Needs frequent redirection.  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 DVT Prophylaxis/Anticoagulation: SCDs. Monitor for any signs of DVT 3. Pain Management:Zanaflex 4 mg nightly--changed to as needed due to morning lethargy 4. Mood:Provide emotional support  -Likely some reactive depression but difficult to assess given his cognitive linguistic deficits---> consider antidepressant 5. Neuropsych: This patientis notcapable of making decisions on hisown behalf. 6. Skin/Wound Care:Routine skin checks 7. Fluids/Electrolytes/Nutrition:Routine in and outs 8.Dysphagia: PEG placed 7/28 by INR  -follow for healing 9.Hypertension.   Lisinopril 20 mg daily, Norvasc 10 mg daily. Monitor with increased mobility  Blood pressures trending back up, increase lisinopril to 30 mg daily 10.Diabetes mellitus. Hemoglobin A1c 6.0.   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 12.Hyperlipidemia. Fenofibrate. 13. Hypernatremia  Sodium  improved to 138 8/2  Free water flushes have been increased   Recheck labs monday 14. AKI on CKD  BUN/Creatinine with gradual improvement===>39/1.58==>35/1.59  -recheck monday  -off IVF since 7/31   -H20 flushes currently 200cc q3 15. Anemia of chronic disease  Hemoglobin 12.4 on 7/30  Cont to monitor 6. Transaminitis  resolved  LOS (Days) [place] EVALUATION WAS PERFORMED  Meredith Staggers 02/12/2018, 7:29 AM

## 2018-02-12 NOTE — Progress Notes (Signed)
Physical Therapy Session Note  Patient Details  Name: Adam Yang MRN: [id number] Date of Birth: [birth year]  Today's Date: 02/12/2018 PT Individual Time: 1400-1500 PT Individual Time Calculation (min): 60 min   Short Term Goals: Week 2:  PT Short Term Goal 1 (Week 2): Pt will complete bed<>w/c with max assist +1. PT Short Term Goal 2 (Week 2): Pt will complete bed mobility with mod assist overall. PT Short Term Goal 3 (Week 2): Pt will initiate w/c mobility.  Skilled Therapeutic Interventions/Progress Updates:    no indication of pain based on faces scale, session focus on weight shifting, following commands, visual scanning, and cognition.  Wife present for session and PT provided education to her throughout session regarding current progress towards goals and stroke recovery.    Pt requires mod assist for rolling to R side and for supine>sit with HOB elevated.  Slide board transfer to pt's R side, max assist and max multimodal cues for forward weight shift, head/hips relationship, and pushing with LUE.  Pt does initiate lateral scoot in 2/4 attempts.  Positioned in chair with max assist for scooting hips back and center.    Beanbag task focus on forward weight shift and core activation with initially mod assist for forward weight shift, fade to min assist to maintain midline only.  Pt completes 3x6 reps with rest breaks between sets.  Peg board task focus on visual scanning and problem solving for simple peg board design of alternating colors.  Pt requires max multimodal cues to select and place correct color peg and then return pegs to bin at end of task.  Frequent rest breaks provided throughout peg board task 2/2 mental fatigue.  Pt returned to room at end of session and positioned upright in w/c with chair alarm intact, call bell in reach and wife present.    Therapy Documentation Precautions:  Precautions Precautions: Fall Precaution Comments: R hemi, R  inattention Restrictions Weight Bearing Restrictions: No   See Function Navigator for Current Functional Status.   Therapy/Group: Individual Therapy  Michel Santee 02/12/2018, 11:06 AM

## 2018-02-12 NOTE — Progress Notes (Signed)
Occupational Therapy Session Note  Patient Details  Name: Adam Yang MRN: [id number] Date of Birth: 1946/02/06  Today's Date: 02/12/2018 OT Individual Time: 1300-1327 OT Individual Time Calculation (min): 27 min    Short Term Goals: Week 2:  OT Short Term Goal 1 (Week 2): Pt will complete sit > stand with mod assist to decrease burden of care with LB bathing/dressing OT Short Term Goal 2 (Week 2): Pt will complete bathing with max assist with mod cues for sequencing OT Short Term Goal 3 (Week 2): Pt will complete UB dressing with max assist OT Short Term Goal 4 (Week 2): Pt will complete toilet transfer with max assist of 1 caregiver  Skilled Therapeutic Interventions/Progress Updates:    1:1. Pt with no indication of pain. recived in [place]. Pt completes squat pivot transfers throughout session with MAX A of 1 -2 to R with arm wrapped around OT to decrease pushing. Pt requires seated EOB with manual facilitation of LUE in lap as pt completes 2x5 modified sit up for trunk control and coming down to L forearm to engage R obliques to sit back up to midline using visual feedback with min A. Unable to facilitate WB through RUE d/t shoulder tone. After returned pt back to bed, provided gentle PROM of external rotation, horizontal abduction and shoulder abduction to decrease tone in shoulders. Placed pillow under RUE for proper positioning. Pt asleep upon exiting, bed alarm on and all needs in reahc.   Therapy Documentation Precautions:  Precautions Precautions: Fall Precaution Comments: R hemi, R inattention Restrictions Weight Bearing Restrictions: No General:    See Function Navigator for Current Functional Status.   Therapy/Group: Individual Therapy  [name] 02/12/2018, 12:52 PM

## 2018-02-13 ENCOUNTER — Telehealth: Payer: Self-pay | Admitting: *Deleted

## 2018-02-13 ENCOUNTER — Inpatient Hospital Stay (HOSPITAL_COMMUNITY): Payer: Medicare Other | Admitting: Physical Therapy

## 2018-02-13 ENCOUNTER — Inpatient Hospital Stay (HOSPITAL_COMMUNITY): Payer: Medicare Other | Admitting: Occupational Therapy

## 2018-02-13 ENCOUNTER — Inpatient Hospital Stay (HOSPITAL_COMMUNITY): Payer: Medicare Other

## 2018-02-13 LAB — BASIC METABOLIC PANEL
ANION GAP: 11 (ref 5–15)
BUN: 45 mg/dL — AB (ref 8–23)
CHLORIDE: 97 mmol/L — AB (ref 98–111)
CO2: 28 mmol/L (ref 22–32)
Calcium: 9.3 mg/dL (ref 8.9–10.3)
Creatinine, Ser: 1.7 mg/dL — ABNORMAL HIGH (ref 0.61–1.24)
GFR calc Af Amer: 45 mL/min — ABNORMAL LOW (ref >60)
GFR, EST NON AFRICAN AMERICAN: 39 mL/min — AB (ref >60)
GLUCOSE: 143 mg/dL — AB (ref 70–99)
POTASSIUM: 4.4 mmol/L (ref 3.5–5.1)
Sodium: 136 mmol/L (ref 135–145)

## 2018-02-13 LAB — CBC
HEMATOCRIT: 35.2 % — AB (ref 39.0–52.0)
Hemoglobin: 11.3 g/dL — ABNORMAL LOW (ref 13.0–17.0)
MCH: 26 pg (ref 26.0–34.0)
MCHC: 32.1 g/dL (ref 30.0–36.0)
MCV: 81.1 fL (ref 78.0–100.0)
Platelets: 305 10*3/uL (ref 150–400)
RBC: 4.34 MIL/uL (ref 4.22–5.81)
RDW: 14.6 % (ref 11.5–15.5)
WBC: 6 10*3/uL (ref 4.0–10.5)

## 2018-02-13 LAB — OCCULT BLOOD X 1 CARD TO LAB, STOOL: FECAL OCCULT BLD: NEGATIVE

## 2018-02-13 MED ORDER — LISINOPRIL 20 MG PO TABS
20.0000 mg | ORAL_TABLET | Freq: Every day | ORAL | Status: DC
  Administered 2018-02-13 – 2018-02-16 (×4): 20 mg via ORAL
  Filled 2018-02-13 (×4): qty 1

## 2018-02-13 MED ORDER — METOPROLOL TARTRATE 12.5 MG HALF TABLET
12.5000 mg | ORAL_TABLET | Freq: Two times a day (BID) | ORAL | Status: DC
  Administered 2018-02-13 – 2018-02-14 (×4): 12.5 mg via ORAL
  Filled 2018-02-13 (×4): qty 1

## 2018-02-13 NOTE — Telephone Encounter (Signed)
Left message with note below 

## 2018-02-13 NOTE — Progress Notes (Addendum)
Physical Therapy Session Note  Patient Details  Name: Adam Yang MRN: [id number] Date of Birth: 12/29/1945  Today's Date: 02/13/2018 PT Individual Time: 9794-8016 PT Individual Time Calculation (min): 53 min   Short Term Goals: Week 2:  PT Short Term Goal 1 (Week 2): Pt will complete bed<>w/c with max assist +1. PT Short Term Goal 2 (Week 2): Pt will complete bed mobility with mod assist overall. PT Short Term Goal 3 (Week 2): Pt will initiate w/c mobility.  Skilled Therapeutic Interventions/Progress Updates:  Pt received in bed. No behaviors demonstrating pain during session. Pt able to initiate and roll R with min assist and cuing for technique to roll R. Pt requires max assist for R sidelying>sitting EOB with hospital bed features & multimodal cuing. Attempted to have pt push RLE to EOB with LLE but pt unable to complete movement even with tactile cuing/manual facilitation. Once sitting EOB pt requires total assist for sitting balance 2/2 pushing R with LLE (LUE placed in lap). Pt transfers bed>w/c towards R to attempt to utilize pt's ability to push but pt continues to require total +2 assist 2/2 inability to follow commands for sequencing & technique of transfer. Positioned pt on EOM in gym with L side against wall and provided max cuing to maintain L shoulder touching wall with LUE in lap to attempt to reorient pt to midline; pt able to maintain static position for up to ~1 minute at a time with frequent cuing and less pushing through LLE. Pt with posterior LOB throughout activity therefore progressed activity to pt reaching anteriorly to retreive horseshoes. Pt requires max assist to reorient to midline when experiencing LOB to R as pt unable to control trunk and return to upright sitting. Pt with improvement in overall sitting balance with task. Pt returned mat>w/c on R via squat pivot with pt maintaining LUE hold on therapist's shoulder and pushing through BLE to allow therapist to assist  with pivot portion max assist (an improvement in this task). In [facility] gym, attempted to engage pt in dynavision to focus on following one step commands and tracking R; pt requires total assist hand over hand to complete task and pt unable to do so without this additional help despite lights off in quiet controlled environment & extra time. Transitioned to having pt reach for bean bags and pt requires significantly extra time but when told task would be terminated after additional attempt pt able to immediately locate and obtain object. At end of session pt left sitting in TIS w/c with alarm belt donned & at nurses station.  Therapy Documentation Precautions:  Precautions Precautions: Fall Precaution Comments: R hemi, R inattention Restrictions Weight Bearing Restrictions: No   See Function Navigator for Current Functional Status.   Therapy/Group: Individual Therapy  Waunita Schooner 02/13/2018, 3:35 PM

## 2018-02-13 NOTE — Progress Notes (Signed)
Subjective/Complaints: Remains globally aphasic and severely dysarthric  ROS: limited due to language/communication    Objective: Vital Signs: Blood pressure (!) 155/72, pulse 79, temperature 99 F (37.2 C), temperature source Axillary, resp. rate 16, height '5\' 9"'$  (1.753 m), weight 60.6 kg (133 lb 9.6 oz), SpO2 97 %. No results found. Results for orders placed or performed during the hospital encounter of 02/02/18 (from the past 72 hour(s))  Basic metabolic panel     Status: Abnormal   Collection Time: 02/13/18  5:32 AM  Result Value Ref Range   Sodium 136 135 - 145 mmol/L   Potassium 4.4 3.5 - 5.1 mmol/L   Chloride 97 (L) 98 - 111 mmol/L   CO2 28 22 - 32 mmol/L   Glucose, Bld 143 (H) 70 - 99 mg/dL   BUN 45 (H) 8 - 23 mg/dL   Creatinine, Ser 1.70 (H) 0.61 - 1.24 mg/dL   Calcium 9.3 8.9 - 10.3 mg/dL   GFR calc non Af Amer 39 (L) >60 mL/min   GFR calc Af Amer 45 (L) >60 mL/min    Comment: (NOTE) The eGFR has been calculated using the CKD EPI equation. This calculation has not been validated in all clinical situations. eGFR's persistently <60 mL/min signify possible Chronic Kidney Disease.    Anion gap 11 5 - 15    Comment: Performed at [facility] [address] [place], [place] [address]  CBC     Status: Abnormal   Collection Time: 02/13/18  5:32 AM  Result Value Ref Range   WBC 6.0 4.0 - 10.5 K/uL   RBC 4.34 4.22 - 5.81 MIL/uL   Hemoglobin 11.3 (L) 13.0 - 17.0 g/dL   HCT 35.2 (L) 39.0 - 52.0 %   MCV 81.1 78.0 - 100.0 fL   MCH 26.0 26.0 - 34.0 pg   MCHC 32.1 30.0 - 36.0 g/dL   RDW 14.6 11.5 - 15.5 %   Platelets 305 150 - 400 K/uL    Comment: Performed at [facility], [place] [address], [place], [place] [address]    Constitutional: No distress . Vital signs reviewed. HEENT: EOMI, oral membranes moist Neck: supple Cardiovascular: RRR without murmur. No JVD    Respiratory: CTA Bilaterally without wheezes or rales. Normal effort    GI: BS +, non-tender,  non-distended  Skin:   Intact. Warm and dry. Neuro: Globally aphasia, fairly alert.  tends to right side somewhat. Motor:   0/5 Right side Musc/Skel:  No edema or tenderness in extremities Psych: flat  Assessment/Plan: 1. 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 Physiatrist is providing close team supervision and 24 hour management of active medical problems listed below. Physiatrist and rehab team continue to assess barriers to discharge/monitor patient progress toward functional and medical goals. FIM: Function - Bathing Position: Bed Body parts bathed by patient: Chest, Right arm Body parts bathed by helper: Left arm, Front perineal area, Buttocks, Right upper leg, Left upper leg, Right lower leg, Left lower leg, Back, Abdomen Assist Level: 2 helpers  Function- Upper Body Dressing/Undressing What is the patient wearing?: Pull over shirt/dress Pull over shirt/dress - Perfomed by helper: Thread/unthread right sleeve, Put head through opening Button up shirt - Perfomed by helper: Thread/unthread left sleeve, Pull shirt around back Assist Level: 2 helpers Function - Lower Body Dressing/Undressing What is the  patient wearing?: Pants, Socks, Shoes Position: Bed Pants- Performed by helper: Thread/unthread left pants leg, Pull pants up/down, Thread/unthread right pants leg Non-skid slipper socks- Performed by helper: Don/doff right sock, Don/doff left sock Socks - Performed by helper: Don/doff right sock, Don/doff left sock Shoes - Performed by helper: Don/doff right shoe, Don/doff left shoe, Fasten right, Fasten left Assist for footwear: Dependant Assist for lower body dressing: 2 Helpers  Function - Toileting Toileting activity did not occur: No continent bowel/bladder event Assist  level: Two helpers  Function - Air cabin crew transfer activity did not occur: Safety/medical concerns  Function - Chair/bed transfer Chair/bed transfer method: Lateral scoot Chair/bed transfer assist level: Maximal assist (Pt 25 - 49%/lift and lower) Chair/bed transfer assistive device: Sliding board Mechanical lift: Stedy Chair/bed transfer details: Tactile cues for posture, Tactile cues for initiation, Manual facilitation for weight shifting, Manual facilitation for placement, Verbal cues for precautions/safety, Verbal cues for technique, Verbal cues for safe use of DME/AE  Function - Locomotion: Wheelchair Will patient use wheelchair at discharge?: Yes Wheelchair activity did not occur: Safety/medical concerns Assist Level: Dependent (Pt equals 0%) Wheel 50 feet with 2 turns activity did not occur: Safety/medical concerns Wheel 150 feet activity did not occur: Safety/medical concerns Turns around,maneuvers to table,bed, and toilet,negotiates 3% grade,maneuvers on rugs and over doorsills: No Function - Locomotion: Ambulation Assistive device: (3 muskateers + w/c follow) Max distance: 3 ft Assist level: 2 helpers Walk 10 feet activity did not occur: Safety/medical concerns Walk 50 feet with 2 turns activity did not occur: Safety/medical concerns Walk 150 feet activity did not occur: Safety/medical concerns Walk 10 feet on uneven surfaces activity did not occur: Safety/medical concerns  Function - Comprehension Comprehension: Auditory Comprehension assist level: Understands basic 50 - 74% of the time/ requires cueing 25 - 49% of the time  Function - Expression Expression: Nonverbal Expression assistive device: Other (Comment) Expression assist level: Expresses basis less than 25% of the time/requires cueing >75% of the time.  Function - Social Interaction Social Interaction assist level: Interacts appropriately 25 - 49% of time - Needs frequent redirection.  Function  - Problem Solving Problem solving assist level: Solves basic less than 25% of the time - needs direction nearly all the time or does not effectively solve problems and may need a restraint for safety  Function - Memory Memory assist level: Recognizes or recalls less than 25% of the time/requires cueing greater than 75% of the time Patient normally able to recall (first 3 days only): None of the above  Medical Problem List and Plan: 1.Right side weakness with facial droop and dysphasiasecondary to large left frontoparietal hemorrhagic infarction with vasogenic edema and mass-effect  Continue CIR  -therapy decr to 15/7 due to poor therapy tolerance  -Improved arousal with Ritalin and other changes made below 2. DVT Prophylaxis/Anticoagulation: SCDs. Monitor for any signs of DVT 3. Pain Management:Zanaflex 4 mg nightly--changed to as needed due to morning lethargy 4. Mood:Provide emotional support  -Likely some reactive depression but difficult to assess given his cognitive linguistic deficits---> consider antidepressant 5. Neuropsych: This patientis notcapable of making decisions on hisown behalf. 6. Skin/Wound Care:Routine skin checks 7. Fluids/Electrolytes/Nutrition:Routine in and outs 8.Dysphagia: PEG placed 7/28 by INR  -follow for healing 9.Hypertension.   Lisinopril 20 mg daily, Norvasc 10 mg daily. Monitor with increased mobility  Blood pressures trending back up, increase lisinopril to 30 mg daily Vitals:   02/12/18 1926 02/13/18 0509  BP: (!) 142/73 (!) 155/72  Pulse: 83  79  Resp: 18 16  Temp: 99 F (37.2 C) 99 F (37.2 C)  SpO2: 95% 97%  add metoprolol 10.Diabetes mellitus. Hemoglobin A1c 6.0.   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 12.Hyperlipidemia. Fenofibrate. 13. Hypernatremia- resolved Na+ 136 on 8/5  Cont free H20 flush 14. AKI on CKD  BUN/Creatinine with gradual  improvement===>39/1.58==>35/1.59  -recheck Monday, elevated again may be associated with increased ACE I  -off IVF since 7/31   -H20 flushes currently 200cc q3 15. Anemia of chronic disease  Hemoglobin 12.4 on 7/30, 11.3- likely dilutional but will check stool  Cont to monitor   LOS (Days) 11 A FACE TO FACE EVALUATION WAS PERFORMED  Charlett Blake 02/13/2018, 7:44 AM

## 2018-02-13 NOTE — Telephone Encounter (Signed)
-----   Message from Heath Lark, MD sent at 02/13/2018  9:08 AM EDT ----- Regarding: call wife I noted he is in rehab I have cancelled his appt this Friday Please call his wife and tell her to call back to reschedule some time when he is DC and well Please document

## 2018-02-13 NOTE — Progress Notes (Signed)
Speech Language Pathology Daily Session Note  Patient Details  Name: Adam Yang MRN: [id number] Date of Birth: 1945-12-30  Today's Date: 02/13/2018 SLP Individual Time: 1000-1050 SLP Individual Time Calculation (min): 50 min  Short Term Goals: Week 2: SLP Short Term Goal 1 (Week 2): Pt will utilize multimodal means to communicate basic wants and needs in 75% of opportunties with Mod A cues.  SLP Short Term Goal 2 (Week 2): Pt answer yes/no questions using multimodal communication in 5 out of 10 opportunities with Max A cues.  SLP Short Term Goal 3 (Week 2): Pt will imitate common sounds/ phrases with multimodal cues and Max A level of support.  SLP Short Term Goal 4 (Week 2): Pt will complete basic problem solving tasks related to ADLs with Min A cues.  SLP Short Term Goal 5 (Week 2): Pt will perform pharyngeal strengthening exercises with Max A cues.  SLP Short Term Goal 6 (Week 2): Given oral stim, pt will initiate timely swallow with Max A cues to perserve function and for participation in dysphagia therpay.   Skilled Therapeutic Interventions:Skilled ST services focused on speech and swallow skills. SLP preform thorough oral care in attempts to remove thrush/dired secretions from tongue. Pt demonstrated initial sustained attention, which faded dramatically by the end of the session. Pt expressed "alright", and repeated 'yes" and "insude" with max A multimodal cues. SLP facilitated response to yes/no questions pertaining to wants/needs throughout session with inconsistent accuracy, pt express nonverbal signs of pain, facial grimacing and moaning, however pt denied pain/discomfort/feeling tried given multimodal cues and use of yes/no questions.  SLP facilitated communication of yes/no responses pertaining to biographical/imediate environment, pt demonstrated 50% with max A multimodal cues. Treatment session was ended early due to pt fatigue and falling asleep, SLP repositioned in pt in tilt chair  to provided comfort.. Pt was left in room with call bell within reach and bed alarm set. Recommend to continue skilled ST services.      Function:  Eating Eating                 Cognition Comprehension Comprehension assist level: Understands basic 50 - 74% of the time/ requires cueing 25 - 49% of the time;Understands basic 25 - 49% of the time/ requires cueing 50 - 75% of the time  Expression      Social Interaction Social Interaction assist level: Interacts appropriately 25 - 49% of time - Needs frequent redirection.  Problem Solving Problem solving assist level: Solves basic less than 25% of the time - needs direction nearly all the time or does not effectively solve problems and may need a restraint for safety  Memory Memory assist level: Recognizes or recalls less than 25% of the time/requires cueing greater than 75% of the time    Pain Pain Assessment Pain Score: 0-No pain  Therapy/Group: Individual Therapy  [name]  [facility] 02/13/2018, 11:10 AM

## 2018-02-13 NOTE — Progress Notes (Signed)
Occupational Therapy Session Note  Patient Details  Name: Adam Yang MRN: [id number] Date of Birth: 04-28-1946  Today's Date: 02/13/2018 OT Individual Time: 1106-1206 OT Individual Time Calculation (min): 60 min   Skilled Therapeutic Interventions/Progress Updates:    Pt greeted in TIS with eyes closed, snoring with mouth open. Provided sternal rub to chest and cold wash cloth to face with little helpfulness. Took him to dayroom and worked in increasing alertness, standing tolerance, UE/LE weightbearing while using standing frame (with 2 assist). Pt able to stand for 1 minute intervals before showing signs of discomfort. Had favorite musicians playing live in concert on screen in front of him to promote visual scanning and forward gaze. B UE weightbearing facilitated at elbows. He partially opened eyes 50% of time. During one trial of standing, he had large and malodorous BM. Took him back to room and transferred pt back to bed using Maxi lift with +2 assist. 2 helpers for rolling, perihygiene, and LB dressing bedlevel. Pt initiating movement for rolling 5% of time. He was left in semi reclined position in bed, with soft call bell in lap, bed alarm set, and 4 bedrails up.   Therapy Documentation Precautions:  Precautions Precautions: Fall Precaution Comments: R hemi, R inattention Restrictions Weight Bearing Restrictions: No Pain: Pain Assessment Pain Score: 0-No pain ADL:       See Function Navigator for Current Functional Status.   Therapy/Group: Individual Therapy  Roxan Yamamoto A Johan Antonacci 02/13/2018, 12:53 PM

## 2018-02-14 ENCOUNTER — Inpatient Hospital Stay (HOSPITAL_COMMUNITY): Payer: Medicare Other

## 2018-02-14 ENCOUNTER — Inpatient Hospital Stay (HOSPITAL_COMMUNITY): Payer: Medicare Other | Admitting: Occupational Therapy

## 2018-02-14 ENCOUNTER — Inpatient Hospital Stay (HOSPITAL_COMMUNITY): Payer: Medicare Other | Admitting: Physical Therapy

## 2018-02-14 MED ORDER — POLYETHYLENE GLYCOL 3350 17 G PO PACK
17.0000 g | PACK | Freq: Every day | ORAL | Status: DC | PRN
  Administered 2018-02-16: 17 g
  Filled 2018-02-14: qty 1

## 2018-02-14 MED ORDER — TIZANIDINE HCL 2 MG PO TABS
2.0000 mg | ORAL_TABLET | Freq: Every day | ORAL | Status: DC
  Administered 2018-02-14: 2 mg via ORAL
  Filled 2018-02-14: qty 1

## 2018-02-14 NOTE — Progress Notes (Signed)
Physical Therapy Session Note  Patient Details  Name: Adam Yang MRN: [id number] Date of Birth: 20-Jun-1946  Today's Date: 02/14/2018 PT Individual Time: 7989-2119 PT Individual Time Calculation (min): 38 min   Short Term Goals: Week 2:  PT Short Term Goal 1 (Week 2): Pt will complete bed<>w/c with max assist +1. PT Short Term Goal 2 (Week 2): Pt will complete bed mobility with mod assist overall. PT Short Term Goal 3 (Week 2): Pt will initiate w/c mobility.  Skilled Therapeutic Interventions/Progress Updates:  Pt received in w/c with family (wife Tye [place] & daughter Summer) present for session. No behaviors observed demonstrating pain. Transported pt to gym via w/c total assist. Pt requires max encouragement and cuing to engage in task. Pt transferred w/c>mat table with slide board and mat>w/c via squat pivot with +2 total assist overall and max multimodal cuing for weight shifting, placement, and sequencing with pt with little to no participation in transfer.  Positioned pt EOM next to wall on L to utilize as visual cue to maintain shoulders to L to address midline orientation. Pt with frequent LOB in all directions and unable to maintain sitting posture despite max<>total assist and cuing. Pt attempted to reach for horseshoes in front of him to promote anterior weight shifting but pt fatiguing quickly with activity and requiring significant assistance for static sitting balance. Pt returned to w/c in manner noted above and assisted back to room. Educated family on DME recommendations (hospital bed, hoyer lift) and pt's CLOF of max<>total assist +2. Pt left sitting in TIS w/c with alarm belt donned & family present to supervise. Pt sleeping in w/c upon therapist's exit.   Therapy Documentation Precautions:  Precautions Precautions: Fall Precaution Comments: R hemi, R inattention Restrictions Weight Bearing Restrictions: No   General: PT Amount of Missed Time (min): 22 Minutes PT Missed  Treatment Reason: Patient fatigue   See Function Navigator for Current Functional Status.   Therapy/Group: Individual Therapy  Waunita Schooner 02/14/2018, 11:58 AM

## 2018-02-14 NOTE — Progress Notes (Signed)
Occupational Therapy Session Note  Patient Details  Name: Adam Yang MRN: [id number] Date of Birth: [birth year]  Today's Date: 02/14/2018 OT Individual Time: 1138-1200 OT Individual Time Calculation (min): 22 min    Short Term Goals: Week 2:  OT Short Term Goal 1 (Week 2): Pt will complete sit > stand with mod assist to decrease burden of care with LB bathing/dressing OT Short Term Goal 2 (Week 2): Pt will complete bathing with max assist with mod cues for sequencing OT Short Term Goal 3 (Week 2): Pt will complete UB dressing with max assist OT Short Term Goal 4 (Week 2): Pt will complete toilet transfer with max assist of 1 caregiver  Skilled Therapeutic Interventions/Progress Updates:     Pt received sitting up in TIS w/c with family present. Pt grasped suction mouth swab with mod HOH A. Pt required multimodal cueing to bring to mouth and purposely perform oral care, requiring max A overall. Pt then completed 2x sit to stand transfers using 3 musketeers, total A to standing. Pt did exhibit efforts to participate in standing but shook head once in standing to request to sit. Pt's family was utilized to provide familiar voices/faces to alert pt in standing. Pt was left sitting up in w/c with family present.   Therapy Documentation Precautions:  Precautions Precautions: Fall Precaution Comments: R hemi, R inattention Restrictions Weight Bearing Restrictions: No General: General PT Missed Treatment Reason: Patient fatigue Pain: Pain Assessment Pain Scale: Faces Faces Pain Scale: No hurt  See Function Navigator for Current Functional Status.   Therapy/Group: Individual Therapy  Curtis Sites 02/14/2018, 1:00 PM

## 2018-02-14 NOTE — Progress Notes (Signed)
Hemoccult stool sent to lab per order x1

## 2018-02-14 NOTE — Progress Notes (Signed)
Occupational Therapy Session Note  Patient Details  Name: Adam Yang MRN: [id number] Date of Birth: 05/05/1946  Today's Date: 02/14/2018 OT Individual Time: 1400-1425 OT Individual Time Calculation (min): 25 min    Short Term Goals: Week 2:  OT Short Term Goal 1 (Week 2): Pt will complete sit > stand with mod assist to decrease burden of care with LB bathing/dressing OT Short Term Goal 2 (Week 2): Pt will complete bathing with max assist with mod cues for sequencing OT Short Term Goal 3 (Week 2): Pt will complete UB dressing with max assist OT Short Term Goal 4 (Week 2): Pt will complete toilet transfer with max assist of 1 caregiver  Skilled Therapeutic Interventions/Progress Updates:    Treatment session with focus on increased initiation and participation in functional activities.  Pt received supine in bed asleep, but easily aroused to name.  Attempted to encourage pt to participate in rolling to come to sitting EOB with pt actively resisting when therapist assisting with reaching LUE across body to place on bed rail.  Therapist positioned pt BLE into position to come to sitting EOB with pt finally initiating rolling trunk.  Max assist to come to sitting EOB.  Pt with increase in pushing tendencies once seated EOB, repositioned LUE into lap to minimize pushing with no change in pushing.  Engaged in weight shifting down on to Lt elbow to break up the pushing but pt would not maintain position on elbow >5 seconds before returning to sitting EOB and pushing to Rt.  Attempted to engage pt in visual attention activity in Lt visual field incorporating family pictures with pt unable (?unwilling) to reach to identify various people in pictures even with hand over hand to point with LUE.  Pt returned to supine in bed and left with HOB elevated and all needs in reach.  Therapy Documentation Precautions:  Precautions Precautions: Fall Precaution Comments: R hemi, R inattention Restrictions Weight  Bearing Restrictions: No General: General PT Missed Treatment Reason: Patient fatigue Vital Signs: Therapy Vitals Temp: 98.4 F (36.9 C) Temp Source: Oral Pulse Rate: 73 Resp: 20 BP: 107/68 Patient Position (if appropriate): Lying Oxygen Therapy SpO2: 100 % O2 Device: Room Air Pain: Pain Assessment Pain Scale: Faces Faces Pain Scale: No hurt  See Function Navigator for Current Functional Status.   Therapy/Group: Individual Therapy  Simonne Come 02/14/2018, 2:49 PM

## 2018-02-14 NOTE — Progress Notes (Signed)
Subjective/Complaints: Per RN pt was restless last noc, remains globally aphasic discussed with SLP  ROS: limited due to language/communication    Objective: Vital Signs: Blood pressure (!) 144/68, pulse 69, temperature 97.6 F (36.4 C), temperature source Oral, resp. rate 18, height '5\' 9"'$  (1.753 m), weight 60.6 kg (133 lb 9.6 oz), SpO2 96 %. No results found. Results for orders placed or performed during the hospital encounter of 02/02/18 (from the past 72 hour(s))  Basic metabolic panel     Status: Abnormal   Collection Time: 02/13/18  5:32 AM  Result Value Ref Range   Sodium 136 135 - 145 mmol/L   Potassium 4.4 3.5 - 5.1 mmol/L   Chloride 97 (L) 98 - 111 mmol/L   CO2 28 22 - 32 mmol/L   Glucose, Bld 143 (H) 70 - 99 mg/dL   BUN 45 (H) 8 - 23 mg/dL   Creatinine, Ser 1.70 (H) 0.61 - 1.24 mg/dL   Calcium 9.3 8.9 - 10.3 mg/dL   GFR calc non Af Amer 39 (L) >60 mL/min   GFR calc Af Amer 45 (L) >60 mL/min    Comment: (NOTE) The eGFR has been calculated using the CKD EPI equation. This calculation has not been validated in all clinical situations. eGFR's persistently <60 mL/min signify possible Chronic Kidney Disease.    Anion gap 11 5 - 15    Comment: Performed at [facility] [address], [place], [place] [address]  CBC     Status: Abnormal   Collection Time: 02/13/18  5:32 AM  Result Value Ref Range   WBC 6.0 4.0 - 10.5 K/uL   RBC 4.34 4.22 - 5.81 MIL/uL   Hemoglobin 11.3 (L) 13.0 - 17.0 g/dL   HCT 35.2 (L) 39.0 - 52.0 %   MCV 81.1 78.0 - 100.0 fL   MCH 26.0 26.0 - 34.0 pg   MCHC 32.1 30.0 - 36.0 g/dL   RDW 14.6 11.5 - 15.5 %   Platelets 305 150 - 400 K/uL    Comment: Performed at [facility], [place] [address] [place], [place] [address]  Occult blood card to lab, stool     Status: None   Collection Time: 02/13/18 11:31 PM  Result Value Ref Range   Fecal Occult Bld NEGATIVE NEGATIVE    Comment: Performed at [facility] [address]  [place], [place] [address]    Constitutional: No distress . Vital signs reviewed. HEENT: EOMI, oral membranes moist Neck: supple Cardiovascular: RRR without murmur. No JVD    Respiratory: CTA Bilaterally without wheezes or rales. Normal effort    GI: BS +, non-tender, non-distended  Skin:   Intact. Warm and dry. Neuro: Globally aphasia, fairly alert.  tends to right side somewhat. Motor:   0/5 Right side Musc/Skel:  No edema or tenderness in extremities Psych: flat  Assessment/Plan: 1. 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 Physiatrist is providing close team supervision and 24 hour management of active medical problems listed below. Physiatrist and rehab team continue to assess barriers to discharge/monitor patient progress toward functional and medical goals. FIM: Function - Bathing Position: Bed Body parts bathed by patient: Chest, Right arm Body parts bathed by helper: Left arm, Front perineal area, Buttocks, Right upper leg, Left upper leg, Right lower leg, Left lower leg, Back, Abdomen Assist  Level: 2 helpers  Function- Engineer, petroleum What is the patient wearing?: Pull over shirt/dress Pull over shirt/dress - Perfomed by helper: Thread/unthread right sleeve, Put head through opening Button up shirt - Perfomed by helper: Thread/unthread left sleeve, Pull shirt around back Assist Level: 2 helpers Function - Lower Body Dressing/Undressing What is the patient wearing?: Pants, Socks, Shoes Position: Bed Pants- Performed by helper: Thread/unthread left pants leg, Pull pants up/down, Thread/unthread right pants leg Non-skid slipper socks- Performed by helper: Don/doff right sock, Don/doff left sock Socks - Performed by helper: Don/doff right sock, Don/doff left  sock Shoes - Performed by helper: Don/doff right shoe, Don/doff left shoe, Fasten right, Fasten left Assist for footwear: Dependant Assist for lower body dressing: 2 Helpers  Function - Toileting Toileting activity did not occur: No continent bowel/bladder event Assist level: Two helpers  Function - Air cabin crew transfer activity did not occur: Safety/medical concerns  Function - Chair/bed transfer Chair/bed transfer method: Lateral scoot Chair/bed transfer assist level: 2 helpers Chair/bed transfer assistive device: Sliding board Mechanical lift: Stedy Chair/bed transfer details: Tactile cues for posture, Tactile cues for initiation, Manual facilitation for weight shifting, Manual facilitation for placement, Verbal cues for technique, Verbal cues for safe use of DME/AE, Tactile cues for sequencing, Tactile cues for weight shifting, Tactile cues for placement, Verbal cues for sequencing, Verbal cues for precautions/safety  Function - Locomotion: Wheelchair Will patient use wheelchair at discharge?: Yes Wheelchair activity did not occur: Safety/medical concerns Assist Level: Dependent (Pt equals 0%) Wheel 50 feet with 2 turns activity did not occur: Safety/medical concerns Wheel 150 feet activity did not occur: Safety/medical concerns Turns around,maneuvers to table,bed, and toilet,negotiates 3% grade,maneuvers on rugs and over doorsills: No Function - Locomotion: Ambulation Assistive device: (3 muskateers + w/c follow) Max distance: 3 ft Assist level: 2 helpers Walk 10 feet activity did not occur: Safety/medical concerns Walk 50 feet with 2 turns activity did not occur: Safety/medical concerns Walk 150 feet activity did not occur: Safety/medical concerns Walk 10 feet on uneven surfaces activity did not occur: Safety/medical concerns  Function - Comprehension Comprehension: Auditory Comprehension assist level: Understands basic less than 25% of the time/ requires cueing  >75% of the time  Function - Expression Expression: Verbal, Nonverbal Expression assistive device: Other (Comment) Expression assist level: Expresses basis less than 25% of the time/requires cueing >75% of the time.  Function - Social Interaction Social Interaction assist level: Interacts appropriately less than 25% of the time. May be withdrawn or combative.  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 DVT Prophylaxis/Anticoagulation: SCDs. Monitor for any signs of DVT 3. Pain Management:Zanaflex 4 mg nightly--changed to as needed due to morning lethargy 4. Mood:Provide emotional support  -Likely some reactive depression but difficult to assess given his cognitive linguistic deficits---> consider antidepressant 5. Neuropsych:  This patientis notcapable of making decisions on hisown behalf. 6. Skin/Wound Care:Routine skin checks 7. Fluids/Electrolytes/Nutrition:Routine in and outs 8.Dysphagia: PEG placed 7/28 by INR  -follow for healing 9.Hypertension.   Lisinopril 20 mg daily, Norvasc 10 mg daily. Monitor with increased mobility  Blood pressures trending back up, decrease lisinopril to 20 mg daily Vitals:   02/13/18 2037 02/14/18 0440  BP: (!) 156/88 (!) 144/68  Pulse: 80 69   Resp: 18 18  Temp: 97.8 F (36.6 C) 97.6 F (36.4 C)  SpO2: 97% 96%  add metoprolol 10.Diabetes mellitus. Hemoglobin A1c 6.0.   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 12.Hyperlipidemia. Fenofibrate. 13. Hypernatremia- resolved Na+ 136 on 8/5  Cont free H20 flush 14. AKI on CKD  BUN/Creatinine with gradual improvement===>39/1.58==>35/1.59==> 45/1.7   -recheck Monday, elevated again may be associated with increased ACE I  -off IVF since 7/31   -H20 flushes currently 200cc q3 15. Anemia of chronic disease  Hemoglobin 12.4 on 7/30, 11.3- stool neg 8/5  Cont to monitor   LOS (Days) 12 A FACE TO FACE EVALUATION WAS PERFORMED  Charlett Blake 02/14/2018, 8:17 AM

## 2018-02-14 NOTE — Progress Notes (Signed)
Speech Language Pathology Daily Session Note  Patient Details  Name: Adam Yang MRN: [id number] Date of Birth: 1946/01/21  Today's Date: 02/14/2018 SLP Individual Time: 1000-1030 SLP Individual Time Calculation (min): 30 min  Short Term Goals: Week 2: SLP Short Term Goal 1 (Week 2): Pt will utilize multimodal means to communicate basic wants and needs in 75% of opportunties with Mod A cues.  SLP Short Term Goal 2 (Week 2): Pt answer yes/no questions using multimodal communication in 5 out of 10 opportunities with Max A cues.  SLP Short Term Goal 3 (Week 2): Pt will imitate common sounds/ phrases with multimodal cues and Max A level of support.  SLP Short Term Goal 4 (Week 2): Pt will complete basic problem solving tasks related to ADLs with Min A cues.  SLP Short Term Goal 5 (Week 2): Pt will perform pharyngeal strengthening exercises with Max A cues.  SLP Short Term Goal 6 (Week 2): Given oral stim, pt will initiate timely swallow with Max A cues to perserve function and for participation in dysphagia therpay.   Skilled Therapeutic Interventions:Skilled ST services focused on swallow skills and family education. Pt's wife and daughter present for treatment session. SLP facilitated oral care with suction toothbrush. SLP provided education about current goals, communication utilizing yes/no questions, oral care to maintain swallow function and following basic commands during ADLs. Pt demonstrated 50% accuracy with max A verbal cues in yes/no response to selection of activity on ipad provided by family.Marland Kitchen SLP answered all questions to satisfaction.  Pt was left in room with call bell within reach and chair alaram set.ST reccomends to continue skilled ST services.     Function:  Eating Eating     Eating Assist Level: Helper performs IV, parenteral or tube feed           Cognition Comprehension Comprehension assist level: Understands basic less than 25% of the time/ requires cueing >75%  of the time  Expression   Expression assist level: Expresses basic 25 - 49% of the time/requires cueing 50 - 75% of the time. Uses single words/gestures.;Expresses basis less than 25% of the time/requires cueing >75% of the time.  Social Interaction Social Interaction assist level: Interacts appropriately 50 - 74% of the time - May be physically or verbally inappropriate.;Interacts appropriately 25 - 49% of time - Needs frequent redirection.  Problem Solving Problem solving assist level: Solves basic less than 25% of the time - needs direction nearly all the time or does not effectively solve problems and may need a restraint for safety;Solves basic 25 - 49% of the time - needs direction more than half the time to initiate, plan or complete simple activities  Memory Memory assist level: Recognizes or recalls less than 25% of the time/requires cueing greater than 75% of the time    Pain Pain Assessment Pain Scale: Faces Pain Score: 0-No pain Faces Pain Scale: No hurt  Therapy/Group: Individual Therapy  Adam Yang  [facility] 02/14/2018, 4:57 PM

## 2018-02-15 ENCOUNTER — Other Ambulatory Visit: Payer: Self-pay

## 2018-02-15 ENCOUNTER — Inpatient Hospital Stay (HOSPITAL_COMMUNITY): Payer: Medicare Other | Admitting: Speech Pathology

## 2018-02-15 ENCOUNTER — Inpatient Hospital Stay (HOSPITAL_COMMUNITY): Payer: Medicare Other | Admitting: Physical Therapy

## 2018-02-15 ENCOUNTER — Inpatient Hospital Stay (HOSPITAL_COMMUNITY): Payer: Medicare Other | Admitting: Occupational Therapy

## 2018-02-15 MED ORDER — METOPROLOL TARTRATE 25 MG PO TABS
25.0000 mg | ORAL_TABLET | Freq: Two times a day (BID) | ORAL | Status: DC
  Administered 2018-02-15 – 2018-02-23 (×14): 25 mg via ORAL
  Filled 2018-02-15 (×15): qty 1

## 2018-02-15 MED ORDER — BACLOFEN 5 MG HALF TABLET
5.0000 mg | ORAL_TABLET | Freq: Every day | ORAL | Status: DC
  Administered 2018-02-15 – 2018-02-17 (×3): 5 mg via ORAL
  Filled 2018-02-15 (×3): qty 1

## 2018-02-15 NOTE — Progress Notes (Signed)
Subjective/Complaints:  More somnolent this am, received zanaflex last noc for severe leg spasms RLE r/t CVA  ROS: limited due to language/communication    Objective: Vital Signs: Blood pressure (!) 155/91, pulse 95, temperature 99.6 F (37.6 C), temperature source Oral, resp. rate 18, height '5\' 9"'$  (1.753 m), weight 60.6 kg (133 lb 9.6 oz), SpO2 92 %. No results found. Results for orders placed or performed during the hospital encounter of 02/02/18 (from the past 72 hour(s))  Basic metabolic panel     Status: Abnormal   Collection Time: 02/13/18  5:32 AM  Result Value Ref Range   Sodium 136 135 - 145 mmol/L   Potassium 4.4 3.5 - 5.1 mmol/L   Chloride 97 (L) 98 - 111 mmol/L   CO2 28 22 - 32 mmol/L   Glucose, Bld 143 (H) 70 - 99 mg/dL   BUN 45 (H) 8 - 23 mg/dL   Creatinine, Ser 1.70 (H) 0.61 - 1.24 mg/dL   Calcium 9.3 8.9 - 10.3 mg/dL   GFR calc non Af Amer 39 (L) >60 mL/min   GFR calc Af Amer 45 (L) >60 mL/min    Comment: (NOTE) The eGFR has been calculated using the CKD EPI equation. This calculation has not been validated in all clinical situations. eGFR's persistently <60 mL/min signify possible Chronic Kidney Disease.    Anion gap 11 5 - 15    Comment: Performed at [facility] [address] [place], [place] [address]  CBC     Status: Abnormal   Collection Time: 02/13/18  5:32 AM  Result Value Ref Range   WBC 6.0 4.0 - 10.5 K/uL   RBC 4.34 4.22 - 5.81 MIL/uL   Hemoglobin 11.3 (L) 13.0 - 17.0 g/dL   HCT 35.2 (L) 39.0 - 52.0 %   MCV 81.1 78.0 - 100.0 fL   MCH 26.0 26.0 - 34.0 pg   MCHC 32.1 30.0 - 36.0 g/dL   RDW 14.6 11.5 - 15.5 %   Platelets 305 150 - 400 K/uL    Comment: Performed at [facility], [place] [address] [place], [place] [address]  Occult blood card to lab, stool     Status: None   Collection Time: 02/13/18 11:31 PM  Result Value Ref Range   Fecal Occult Bld NEGATIVE NEGATIVE    Comment: Performed at [facility] [address], [place], [place] [address]    Constitutional: No distress . Vital signs reviewed. HEENT: EOMI, oral membranes moist Neck: supple Cardiovascular: RRR without murmur. No JVD    Respiratory: CTA Bilaterally without wheezes or rales. Normal effort    GI: BS +, non-tender, non-distended  Skin:   Intact. Warm and dry. Neuro: Globally aphasia, fairly alert.  tends to right side somewhat. Motor:   0/5 Right side Musc/Skel:  No edema or tenderness in extremities Psych: flat  Assessment/Plan: 1. 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 Physiatrist is providing close team supervision and 24 hour management of active medical problems listed below. Physiatrist and rehab team continue to assess barriers to discharge/monitor patient progress toward functional and medical goals. FIM: Function - Bathing Position: Bed Body parts bathed by patient: Chest, Right arm Body parts bathed by helper: Left arm, Front perineal area, Buttocks, Right upper leg, Left upper leg, Right lower leg, Left lower leg,  Back, Abdomen Assist Level: 2 helpers  Function- Upper Body Dressing/Undressing What is the patient wearing?: Pull over shirt/dress Pull over shirt/dress - Perfomed by helper: Thread/unthread right sleeve, Put head through opening Button up shirt - Perfomed by helper: Thread/unthread left sleeve, Pull shirt around back Assist Level: 2 helpers Function - Lower Body Dressing/Undressing What is the patient wearing?: Pants, Socks, Shoes Position: Bed Pants- Performed by helper: Thread/unthread left pants leg, Pull pants up/down, Thread/unthread right pants leg Non-skid slipper socks- Performed by helper: Don/doff right sock, Don/doff left sock Socks - Performed by helper: Don/doff right sock, Don/doff left  sock Shoes - Performed by helper: Don/doff right shoe, Don/doff left shoe, Fasten right, Fasten left Assist for footwear: Dependant Assist for lower body dressing: 2 Helpers  Function - Toileting Toileting activity did not occur: No continent bowel/bladder event Assist level: Two helpers  Function - Air cabin crew transfer activity did not occur: Safety/medical concerns  Function - Chair/bed transfer Chair/bed transfer method: Lateral scoot, Squat pivot Chair/bed transfer assist level: 2 helpers Chair/bed transfer assistive device: Sliding board Mechanical lift: Stedy Chair/bed transfer details: Tactile cues for posture, Tactile cues for initiation, Manual facilitation for weight shifting, Manual facilitation for placement, Verbal cues for technique, Verbal cues for safe use of DME/AE, Tactile cues for sequencing, Tactile cues for weight shifting, Tactile cues for placement, Verbal cues for sequencing, Verbal cues for precautions/safety  Function - Locomotion: Wheelchair Will patient use wheelchair at discharge?: Yes Wheelchair activity did not occur: Safety/medical concerns Assist Level: Dependent (Pt equals 0%) Wheel 50 feet with 2 turns activity did not occur: Safety/medical concerns Wheel 150 feet activity did not occur: Safety/medical concerns Turns around,maneuvers to table,bed, and toilet,negotiates 3% grade,maneuvers on rugs and over doorsills: No Function - Locomotion: Ambulation Assistive device: (3 muskateers + w/c follow) Max distance: 3 ft Assist level: 2 helpers Walk 10 feet activity did not occur: Safety/medical concerns Walk 50 feet with 2 turns activity did not occur: Safety/medical concerns Walk 150 feet activity did not occur: Safety/medical concerns Walk 10 feet on uneven surfaces activity did not occur: Safety/medical concerns  Function - Comprehension Comprehension: Auditory Comprehension assist level: Understands basic less than 25% of the time/  requires cueing >75% of the time  Function - Expression Expression: Verbal, Nonverbal Expression assistive device: Other (Comment) Expression assist level: Expresses basic 25 - 49% of the time/requires cueing 50 - 75% of the time. Uses single words/gestures., Expresses basis less than 25% of the time/requires cueing >75% of the time.  Function - Social Interaction Social Interaction assist level: Interacts appropriately 50 - 74% of the time - May be physically or verbally inappropriate., Interacts appropriately 25 - 49% of time - Needs frequent redirection.  Function - Problem Solving Problem solving assist level: Solves basic less than 25% of the time - needs direction nearly all the time or does not effectively solve problems and may need a restraint for safety, Solves basic 25 - 49% of the time - needs direction more than half the time to initiate, plan or complete simple activities  Function - Memory Memory assist level: Recognizes or recalls less than 25% of the time/requires cueing greater than 75% of the time Patient normally able to recall (first 3 days only): None of the above  Medical Problem List and Plan: 1.Right side weakness with facial droop and dysphasiasecondary to 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 Formulized individual treatment plan based on medical history, underlying problem and comorbidities.  -therapy decr to 15/7 due to poor therapy tolerance  -Improved arousal with Ritalin and other changes made below 2. DVT Prophylaxis/Anticoagulation: SCDs. Monitor for any signs of DVT 3. Pain Management:Zanaflex 4 mg nightly--changed to as needed due to morning lethargy 4. Mood:Provide emotional support  -Likely some reactive depression but difficult to assess  given his cognitive linguistic deficits---> consider antidepressant 5. Neuropsych: This patientis notcapable of making decisions on hisown behalf. 6. Skin/Wound Care:Routine skin checks 7. Fluids/Electrolytes/Nutrition:Routine in and outs 8.Dysphagia: PEG placed 7/28 by INR  -follow for healing 9.Hypertension.   Lisinopril 20 mg daily, Norvasc 10 mg daily. Monitor with increased mobility  Blood pressures trending back up, but BUN/Cr rising  Vitals:   02/14/18 2056 02/15/18 0455  BP: (!) 158/87 (!) 155/91  Pulse: 91 95  Resp: 18 18  Temp: 97.6 F (36.4 C) 99.6 F (37.6 C)  SpO2: 94% 92%  increase metoprolol- if BP control improves may reduce lisinopril 10.Diabetes mellitus. Hemoglobin A1c 6.0.   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 12.Hyperlipidemia. Fenofibrate. 13. Hypernatremia- resolved Na+ 136 on 8/5  Cont free H20 flush 14. AKI on CKD  BUN/Creatinine still elevated 45/1.7 don't think this is due to low volume  - may be associated with increased ACE I  -off IVF since 7/31   -H20 flushes currently 200cc q3 15. Anemia of chronic disease  Hemoglobin 12.4 on 7/30, 11.3- stool neg 8/5  Cont to monitor 16.  Low grade temp x 1 will monitor for now , no apparent source  LOS (Days) 13 A FACE TO FACE EVALUATION WAS PERFORMED  Charlett Blake 02/15/2018, 8:13 AM

## 2018-02-15 NOTE — Progress Notes (Addendum)
Physical Therapy Session Note  Patient Details  Name: Adam Yang MRN: [id number] Date of Birth: 1946-01-09  Today's Date: 02/15/2018 PT Individual Time: 1583-0940 PT Individual Time Calculation (min): 48 min   Short Term Goals: Week 2:  PT Short Term Goal 1 (Week 2): Pt will complete bed<>w/c with max assist +1. PT Short Term Goal 2 (Week 2): Pt will complete bed mobility with mod assist overall. PT Short Term Goal 3 (Week 2): Pt will initiate w/c mobility.  Skilled Therapeutic Interventions/Progress Updates:  Pt received in bed with daughter arriving for session. Pt occasionally rubbing RUE but otherwise no behaviors demonstrating pain during session. Pt observed to be incontinent of urine and therapist provides dependent assist for peri hygiene and donning clean brief & shorts. Pt is able to assist with rolling R with hospital bed features and holding to bed rail with LUE but requires +2 assist to roll L. Pt transfers R sidelying>sitting EOB with total assist with little participation in pushing to upright sitting. Pt requires +2 dependent assist to transfer bed>TIS w/c. Transported pt to dayroom via w/c total assist and pt transferred sit>stand at high/low table with +2 assist with pt with little participation in transfer but able to weight bear and tolerate standing x 2 trials <1 minute each with BUE on table for increased weight bearing BUE. Pt frequently closing eyes during task. Returned to w/c and attempted to have pt retreive cups with hand over hand assist for LUE with minimal to no participation in task. At end of session pt left sitting in w/c in room with daughter present to supervise.   Addendum: Educated pt's daughter on pt's expected level of care (total assist +2) upon d/c and minimal to no overall functional progress thus far.  Therapy Documentation Precautions:  Precautions Precautions: Fall Precaution Comments: R hemi, R inattention Restrictions Weight Bearing  Restrictions: No   General: PT Amount of Missed Time (min): 12 Minutes PT Missed Treatment Reason: Patient fatigue   See Function Navigator for Current Functional Status.   Therapy/Group: Individual Therapy  Waunita Schooner 02/15/2018, 3:52 PM

## 2018-02-15 NOTE — Progress Notes (Signed)
Occupational Therapy Session Note  Patient Details  Name: Adam Yang MRN: [id number] Date of Birth: 30-Apr-1946  Today's Date: 02/15/2018 OT Individual Time: 4920-1007 OT Individual Time Calculation (min): 24 min  and Today's Date: 02/15/2018 OT Missed Time: 36 Minutes Missed Time Reason: Patient fatigue   Short Term Goals: Week 2:  OT Short Term Goal 1 (Week 2): Pt will complete sit > stand with mod assist to decrease burden of care with LB bathing/dressing OT Short Term Goal 2 (Week 2): Pt will complete bathing with max assist with mod cues for sequencing OT Short Term Goal 3 (Week 2): Pt will complete UB dressing with max assist OT Short Term Goal 4 (Week 2): Pt will complete toilet transfer with max assist of 1 caregiver  Skilled Therapeutic Interventions/Progress Updates:    Attempted to engage pt in treatment session, however pt with increased lethargy with difficulty arousing.  Altered environment by turning on lights and increasing volume on pt music, applied wash cloth to face, and attempted sternal rubs with pt only moaning to stimulus.  Noted pt to be incontinent of urine, therefore completed rolling with +2 assist due to decreased arousal and participation.  Pt left in supine in bed with all needs in reach.  Therapy Documentation Precautions:  Precautions Precautions: Fall Precaution Comments: R hemi, R inattention Restrictions Weight Bearing Restrictions: No General: General OT Amount of Missed Time: 36 Minutes Vital Signs: Therapy Vitals Temp: 99.4 F (37.4 C) Temp Source: Oral Pain: Pain Assessment Pain Scale: 0-10 Pain Score: 0-No pain  See Function Navigator for Current Functional Status.   Therapy/Group: Individual Therapy  Simonne Come 02/15/2018, 11:58 AM

## 2018-02-15 NOTE — Progress Notes (Signed)
Social Work Patient ID: Adam Yang, male   DOB: 1945-11-25, 72 y.o.   MRN: [id number]  Met with daughter who was here and attempted to contact wife without success to discuss team conference progress, medication change and concerns regarding amount of care pt will require at discharge. Daughter and wife have the same concerns about their ability to provide the care pt will require at home. Discussed NHP and how the process works they would need to tour and pick the facility they were interested in and then the process could begin for insurance approval. Then based upon that if denied a peer to peer with MD could occur and then a family appeal is the last option. Daughter will talk with Mom and get back with this worker regarding decision. This program may be too intensive for pt and he may need a less intense program. He currently is 15/7 and having difficulty with participating but has had medication changed also. Work on the best plan for pt and family.

## 2018-02-15 NOTE — Progress Notes (Signed)
Speech Language Pathology Daily Session Note  Patient Details  Name: Adam Yang MRN: [id number] Date of Birth: 09/08/1945  Today's Date: 02/15/2018 SLP Individual Time: 4270-6237 SLP Individual Time Calculation (min): 23 min  Short Term Goals: Week 2: SLP Short Term Goal 1 (Week 2): Pt will utilize multimodal means to communicate basic wants and needs in 75% of opportunties with Mod A cues.  SLP Short Term Goal 2 (Week 2): Pt answer yes/no questions using multimodal communication in 5 out of 10 opportunities with Max A cues.  SLP Short Term Goal 3 (Week 2): Pt will imitate common sounds/ phrases with multimodal cues and Max A level of support.  SLP Short Term Goal 4 (Week 2): Pt will complete basic problem solving tasks related to ADLs with Min A cues.  SLP Short Term Goal 5 (Week 2): Pt will perform pharyngeal strengthening exercises with Max A cues.  SLP Short Term Goal 6 (Week 2): Given oral stim, pt will initiate timely swallow with Max A cues to perserve function and for participation in dysphagia therpay.   Skilled Therapeutic Interventions: Skilled treatment session focused on cognition goals. SLP received pt in bed and alert. Pt moaning and continuously moving left leg. Pt unable to answer any yes/no questions about pain etc. With Total A cues, pt able to demonstrate brief moments of joint attention while SLP showed him pictures of family. Despite Total A, pt was not able to participate in any further therapy tasks. SLP left upright in bed with nursing staff present.       Cognition Comprehension Comprehension assist level: Understands basic less than 25% of the time/ requires cueing >75% of the time  Expression   Expression assist level: Expresses basis less than 25% of the time/requires cueing >75% of the time.  Social Interaction Social Interaction assist level: Interacts appropriately less than 25% of the time. May be withdrawn or combative.  Problem Solving Problem solving  assist level: Solves basic less than 25% of the time - needs direction nearly all the time or does not effectively solve problems and may need a restraint for safety  Memory Memory assist level: Recognizes or recalls less than 25% of the time/requires cueing greater than 75% of the time    Pain    Therapy/Group: Individual Therapy  [name] 02/15/2018, 3:19 PM

## 2018-02-15 NOTE — Patient Care Conference (Signed)
Inpatient RehabilitationTeam Conference and Plan of Care Update Date: 02/15/2018   Time: 10:45 AM    Patient Name: Adam Yang      Medical Record Number: [id number]  Date of Birth: Jun 27, 1946 Sex: Male         Room/Bed: 4W21C/4W21C-01 Payor Info: Payor: [employer] / Plan: BCBS MEDICARE / Product Type: *No Product type* /    Admitting Diagnosis: Lt CVA  Admit Date/Time:  02/02/2018  5:56 PM Admission Comments: No comment available   Primary Diagnosis:  <principal problem not specified> Principal Problem: <principal problem not specified>  Patient Active Problem List   Diagnosis Date Noted  . Dysphasia   . Anemia of chronic disease   . Hypernatremia   . Transaminitis   . Hypertensive emergency 02/02/2018  . Intraparenchymal hemorrhage of brain ([place]) 02/02/2018  . Hemiparesis affecting right side as late effect of stroke ([place])   . Aphasia, post-stroke   . Dysphagia, post-stroke   . Nontraumatic subcortical hemorrhage of left cerebral hemisphere ([place])   . Diabetes mellitus type 2 in nonobese (HCC)   . AKI (acute kidney injury) ([place])   . Hyponatremia   . Chronic kidney disease   . Cytotoxic brain edema ([place]) 01/28/2018  . Brain herniation ([place]) 01/28/2018  . IVH (intraventricular hemorrhage) ([place]) 01/27/2018  . Right inguinal hernia 08/23/2017  . Chronic kidney disease, stage III (moderate) ([place]) 02/18/2017  . 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 Other fatigue 06/18/2016  . Essential hypertension 10/17/2015  . Mucositis due to chemotherapy 01/24/2015  . Hyperglycemia due to type 2 diabetes mellitus ([place]) 01/07/2015  . Chronic anxiety 12/19/2014  . Stage 2 chronic renal impairment associated with type 2 diabetes mellitus ([place]) 12/19/2014  . Type 2 diabetes mellitus, controlled, with renal complications ([place]) 22/29/7989  . History of nasopharyngeal cancer 11/26/2014    Expected Discharge Date: Expected  Discharge Date: 03/01/18  Team Members Present: Physician leading conference: Dr. Alysia Penna Social Worker Present: Ovidio Kin, LCSW Nurse Present: Dorthula Nettles, RN PT Present: Lavone Nian, PT OT Present: Simonne Come, OT SLP Present: Stormy Fabian, SLP PPS Coordinator present : Daiva Nakayama, RN, CRRN     Current Status/Progress Goal Weekly Team Focus  Medical   Global aphasia, severe dysphagia, a.m. somnolence likely medication related  Maintain medical stability, reduce fall risk, improve communication, improve level of alertness  Adjust spasticity medication switch from Zanaflex to baclofen   Bowel/Bladder   INC of bowel and bladder.   Skin to remain dry and intact without infection.   to maintain healthy skin without and breakdown.    Swallow/Nutrition/ Hydration   NPO -   Total A -downgraded 8/8  attempts to model pharyngeal strengthening exercises were unsuccessful in pt producing any movement   ADL's   max - total +2 bed mobility, transfers, LB bathing/dressing at bed level due to pushing tendencies in sitting and standing, Rt inattention, decreased initiation and participation  downgraded to max assist  ADL retraining, transfers, family education, participation in treatment sessions   Mobility   max<>total assist +2 overall, R inattention, impaired cognition, fatigues quickly, poor endurance & activity tolerance  max assist gait with PT only, min assist w/c mobility, mod<>max assist transfers, likely to be downgraded to max<>total +1 transfers, mod assist bed mobility  R attention, cognitive remediation, transfers, bed mobility, activity tolerance, R NMR, balance, strengthening, endurance    Communication   Total A for yes/no  Total A  downgraded 02/16/18  yes/no question, vowel imitation, gestures   Safety/Cognition/ Behavioral Observations  Total A for focused attention  Total A - goal downgraded 02/16/18  focused attention   Pain   No pain  To remain pain free or  less than 3 rate.   Manage and treat pain in a timely manner.    Skin   No break down  To remain free of wounds and infections.   Asseess skin daily per shift.       *See Care Plan and progress notes for long and short-term goals.     Barriers to Discharge  Current Status/Progress Possible Resolutions Date Resolved   Physician    Medical stability;Decreased caregiver support     Limited progress, fluctuating mentation  Continue rehab, see above, will need SNF      Nursing                  PT  Incontinence;Nutrition means;Decreased caregiver support;Behavior;Lack of/limited family support  family only able to provide min assist & pt currently requires total +2 assist, will likely require extensive assist upon d/c              OT                  SLP                SW                Discharge Planning/Teaching Needs:  Have encouraged wife and daughter to attend therapies wiht pt to see the amount of car ehe will require at home. Wife is expressing concerns aobut the levle of care he will be at DC      Team Discussion:  Making slow progress, still 15/7. MD changing meds for spascity-now on baclofen-should improve in a few days. R-inattention-fatigues quickly. 50% accurate on yes/no. Will need to downgrade goals to mod/max versus total. Family to attend therapies to see if possibility of taking him home and providing the level of care he will need.  Revisions to Treatment Plan:  DC 8/21-versus NHP    Continued Need for Acute Rehabilitation Level of Care: The patient requires daily medical management by a physician with specialized training in physical medicine and rehabilitation for the following conditions: Daily direction of a multidisciplinary physical rehabilitation program to ensure safe treatment while eliciting the highest outcome that is of practical value to the patient.: Yes Daily medical management of patient stability for increased activity during participation in an  intensive rehabilitation regime.: Yes Daily analysis of laboratory values and/or radiology reports with any subsequent need for medication adjustment of medical intervention for : Neurological problems;Nutritional problems;Renal problems  Elease Hashimoto 02/16/2018, 9:41 AM

## 2018-02-16 ENCOUNTER — Inpatient Hospital Stay (HOSPITAL_COMMUNITY): Payer: Medicare Other | Admitting: Physical Therapy

## 2018-02-16 ENCOUNTER — Ambulatory Visit (HOSPITAL_COMMUNITY): Payer: Medicare Other | Admitting: Speech Pathology

## 2018-02-16 ENCOUNTER — Inpatient Hospital Stay (HOSPITAL_COMMUNITY): Payer: Medicare Other | Admitting: Occupational Therapy

## 2018-02-16 NOTE — Progress Notes (Signed)
Subjective/Complaints:  Remains aphasic  ROS: limited due to language/communication    Objective: Vital Signs: Blood pressure (!) 141/73, pulse 89, temperature 98.9 F (37.2 C), temperature source Oral, resp. rate 20, height 5\' 9"  (1.753 m), weight 60.6 kg, SpO2 92 %. No results found. Results for orders placed or performed during the hospital encounter of 02/02/18 (from the past 72 hour(s))  Occult blood card to lab, stool     Status: None   Collection Time: 02/13/18 11:31 PM  Result Value Ref Range   Fecal Occult Bld NEGATIVE NEGATIVE    Comment: Performed at [facility], [address] [place], [place] [address]    Constitutional: No distress . Vital signs reviewed. HEENT: EOMI, oral membranes moist Neck: supple Cardiovascular: RRR without murmur. No JVD    Respiratory: CTA Bilaterally without wheezes or rales. Normal effort    GI: BS +, non-tender, non-distended  Skin:   Intact. Warm and dry. Neuro: Globally aphasia, fairly alert.  tends to right side somewhat. Motor:   0/5 Right side Musc/Skel:  No edema or tenderness in extremities Psych: flat  Assessment/Plan: 1. 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 Physiatrist is providing close team supervision and 24 hour management of active medical problems listed below. Physiatrist and rehab team continue to assess barriers to discharge/monitor patient progress toward functional and medical goals. FIM: Function - Bathing Position: Bed Body parts bathed by patient: Chest, Right arm Body parts bathed by helper: Left arm, Front perineal area, Buttocks, Right upper leg, Left upper leg, Right lower leg, Left lower leg, Back, Abdomen Assist Level: 2 helpers  Function- Upper Body Dressing/Undressing What is the patient  wearing?: Pull over shirt/dress Pull over shirt/dress - Perfomed by helper: Thread/unthread right sleeve, Put head through opening Button up shirt - Perfomed by helper: Thread/unthread left sleeve, Pull shirt around back Assist Level: 2 helpers Function - Lower Body Dressing/Undressing What is the patient wearing?: Pants, Socks, Shoes Position: Bed Pants- Performed by helper: Thread/unthread left pants leg, Pull pants up/down, Thread/unthread right pants leg Non-skid slipper socks- Performed by helper: Don/doff right sock, Don/doff left sock Socks - Performed by helper: Don/doff right sock, Don/doff left sock Shoes - Performed by helper: Don/doff right shoe, Don/doff left shoe, Fasten right, Fasten left Assist for footwear: Dependant Assist for lower body dressing: 2 Helpers  Function - Toileting Toileting activity did not occur: No continent bowel/bladder event Assist level: Two helpers  Function - Air cabin crew transfer activity did not occur: Safety/medical concerns  Function - Chair/bed transfer Chair/bed transfer method: Squat pivot Chair/bed transfer assist level: 2 helpers Chair/bed transfer assistive device: Sliding board Mechanical lift: Stedy Chair/bed transfer details: Tactile cues for initiation, Tactile cues for sequencing, Tactile cues for weight shifting, Tactile cues for posture, Tactile cues for placement, Verbal cues for technique, Verbal cues for precautions/safety, Manual facilitation for weight shifting, Manual facilitation for placement  Function - Locomotion: Wheelchair Will patient use wheelchair at discharge?: Yes Wheelchair activity did not occur: Safety/medical concerns Assist Level: Dependent (Pt equals 0%) Wheel 50 feet with 2 turns activity did not occur: Safety/medical concerns Wheel 150 feet activity did not occur: Safety/medical concerns Turns around,maneuvers to table,bed, and toilet,negotiates 3% grade,maneuvers on rugs and over  doorsills: No Function - Locomotion: Ambulation Assistive device: (3 muskateers + w/c follow) Max distance: 3 ft Assist  level: 2 helpers Walk 10 feet activity did not occur: Safety/medical concerns Walk 50 feet with 2 turns activity did not occur: Safety/medical concerns Walk 150 feet activity did not occur: Safety/medical concerns Walk 10 feet on uneven surfaces activity did not occur: Safety/medical concerns  Function - Comprehension Comprehension: Auditory Comprehension assist level: Understands basic less than 25% of the time/ requires cueing >75% of the time  Function - Expression Expression: Nonverbal Expression assistive device: Other (Comment) Expression assist level: Expresses basis less than 25% of the time/requires cueing >75% of the time.  Function - Social Interaction Social Interaction assist level: Interacts appropriately less than 25% of the time. May be withdrawn or combative.  Function - Problem Solving Problem solving assist level: Solves basic less than 25% of the time - needs direction nearly all the time or does not effectively solve problems and may need a restraint for safety  Function - Memory Memory assist level: Recognizes or recalls less than 25% of the time/requires cueing greater than 75% of the time Patient normally able to recall (first 3 days only): None of the above  Medical Problem List and Plan: 1.Right side weakness with facial droop and dysphasiasecondary to large left frontoparietal hemorrhagic infarction with vasogenic edema and mass-effect  Continue CIR minimal progression, would rec SNF given 2+ assist level  -therapy decr to 15/7 due to poor therapy tolerance  -Improved arousal with Ritalin and other changes made below 2. DVT Prophylaxis/Anticoagulation: SCDs. Monitor for any signs of DVT 3. Pain Management:Zanaflex 4 mg nightly--changed to as needed due to morning lethargy 4. Mood:Provide emotional support  -Likely some reactive  depression but difficult to assess given his cognitive linguistic deficits---> consider antidepressant 5. Neuropsych: This patientis notcapable of making decisions on hisown behalf. 6. Skin/Wound Care:Routine skin checks 7. Fluids/Electrolytes/Nutrition:Routine in and outs 8.Dysphagia: PEG placed 7/28 by INR  -follow for healing 9.Hypertension.   Lisinopril 20 mg daily, Norvasc 10 mg daily. Monitor with increased mobility  Blood pressures trending back up, but BUN/Cr rising  Vitals:   02/15/18 2004 02/16/18 0408  BP: 137/67 (!) 141/73  Pulse: 87 89  Resp: 18 20  Temp: 99.3 F (37.4 C) 98.9 F (37.2 C)  SpO2: 94% 92%  increase metoprolol- if BP control improves may reduce lisinopril- cont current dose for now 10.Diabetes mellitus. Hemoglobin A1c 6.0.   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 12.Hyperlipidemia. Fenofibrate. 13. Hypernatremia- resolved Na+ 136 on 8/5  Cont free H20 flush 14. AKI on CKD  BUN/Creatinine still elevated 45/1.7 don't think this is due to low volume  - may be associated with ACE I  -off IVF since 7/31   -H20 flushes currently 200cc q3 15. Anemia of chronic disease  Hemoglobin 12.4 on 7/30, 11.3- stool neg 8/5  Cont to monitor   LOS (Days) 14 A FACE TO FACE EVALUATION WAS PERFORMED  Charlett Blake 02/16/2018, 8:42 AM

## 2018-02-16 NOTE — Progress Notes (Signed)
Nutrition Follow-up  DOCUMENTATION CODES:   Non-severe (moderate) malnutrition in context of chronic illness  INTERVENTION:  ContinueGlucerna 1.2 formula via PEGat goal rate of 52m/hr x 20 hours (may hold TF for up to 4 hours for therapy) with 30 ml Prostat once daily.  Continue free water flushes of 200 ml every 3 hours. (MD to adjust as appropriate)  Tube feeding regimen provides1900kcal (100% of needs),105grams of protein, and 20158mof H2O.   NUTRITION DIAGNOSIS:   Moderate Malnutrition related to chronic illness(known hx cancer with dysphagia) as evidenced by moderate fat depletion, moderate muscle depletion; ongoing  GOAL:   Patient will meet greater than or equal to 90% of their needs; met via TF  MONITOR:   PO intake, Weight trends, Labs, Skin, I & O's, TF tolerance  REASON FOR ASSESSMENT:   Consult Enteral/tube feeding initiation and management  ASSESSMENT:   [age]ear old right-handed male with history of hypertension, type 2 diabetes mellitus, CKD, nasopharynx cancer with radiation therapy and remote tobacco abuse.  Presented 01/26/2018 with right-sided weakness as well as facial droop and bradycardia. 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 Patient currently NPO with a Cortrak tube for nutritional support.    Pt was actively working with therapy during attempted time of visit. Per RN, pt alertness with very slight improvement. Pt continues to be mostly sleepy/fatigued. Pt has been tolerating his tube feedings with no other difficulties. RD to continue with current orders. Will continue to monitor.    Diet Order:   Diet Order            Diet NPO time specified  Diet effective now              EDUCATION NEEDS:   Not appropriate for education at this time  Skin:  Skin Assessment: Reviewed RN Assessment  Last BM:  8/8  Height:   Ht Readings from Last 1 Encounters:  02/02/18 '5\' 9"'$  (1.753 m)    Weight:   Wt Readings from Last 1  Encounters:  02/11/18 60.6 kg    Ideal Body Weight:  72.7 kg  BMI:  Body mass index is 19.73 kg/m.  Estimated Nutritional Needs:   Kcal:  1750-1900  Protein:  80-95 grams  Fluid:  >/= 1.7 L/day    StCorrin ParkerMS, RD, LDN Pager # [phone] hours/ weekend pager # [phone]

## 2018-02-16 NOTE — Progress Notes (Signed)
Occupational Therapy Session Note  Patient Details  Name: Adam Yang MRN: [id number] Date of Birth: 08/08/1945  Today's Date: 02/16/2018 OT Individual Time: 2800-3491 OT Individual Time Calculation (min): 49 min    Short Term Goals: Week 2:  OT Short Term Goal 1 (Week 2): Pt will complete sit > stand with mod assist to decrease burden of care with LB bathing/dressing OT Short Term Goal 2 (Week 2): Pt will complete bathing with max assist with mod cues for sequencing OT Short Term Goal 3 (Week 2): Pt will complete UB dressing with max assist OT Short Term Goal 4 (Week 2): Pt will complete toilet transfer with max assist of 1 caregiver  Skilled Therapeutic Interventions/Progress Updates:    Pt worked on bathing and dressing sit to stand at the sink.  Total assist for all aspects of bathing including +2 assist for sit to stand and standing balance.  Pt would assist with washing some parts but needed max hand over hand cueing to begin washing his chest and right arm.  He would then take over and complete some of the bathing of the body part with decreased thoroughness.  Max assist hand over hand to complete washing all other parts secondary to motor planning deficits and receptive difficulties.  Increased posterior and left pushing  In sitting with total assist for sitting balance while attempting to remove and donn shirt and abdominal binder.  Total assist +2 for sit to stand and to maintain standing balance with LB selfcare secondary to pushing to the left.  Pt left in tilt in space wheelchair at end of session with call button and phone in reach and pt's spouse present.  He also had chair alarm belt in place as well.    Therapy Documentation Precautions:  Precautions Precautions: Fall Precaution Comments: R hemi, R inattention, psuher, global aphasia Restrictions Weight Bearing Restrictions: No  Pain: Pain Assessment Pain Scale: Faces Pain Score: 0-No pain ADL: See Function Navigator  for Current Functional Status.   Therapy/Group: Individual Therapy  Jerusalen Mateja OTR/L 02/16/2018, 12:59 PM

## 2018-02-16 NOTE — Progress Notes (Signed)
Physical Therapy Session Note  Patient Details  Name: Adam Yang MRN: [id number] Date of Birth: 1946-06-04  Today's Date: 02/16/2018 PT Co-Treatment Time: 1000-1045 PT Co-Treatment Time Calculation (min): 45 min  Short Term Goals: Week 2:  PT Short Term Goal 1 (Week 2): Pt will complete bed<>w/c with max assist +1. PT Short Term Goal 2 (Week 2): Pt will complete bed mobility with mod assist overall. PT Short Term Goal 3 (Week 2): Pt will initiate w/c mobility.  Skilled Therapeutic Interventions/Progress Updates:    Co-treatment therapy session with SLP. Pt received semi-reclined in bed finishing up receiving medication from RN. Pt has no indications of pain. BLE stretches 3 x 30 sec in available planes of motion. Rolling L/R with total A x 2 to don pants. Semi-reclined to sitting with total A x 2. Sitting balance EOB with total A x 2 for trunk control while encouraging patient to reach for personal clothing items with LUE. Max multimodal cueing for forward lean and return to midline. Pt exhibits minimal engagement in activity and needs v/c to keep eyes open during therapy tasks. Sit to supine total A x 2. Rolling L/R with total A x 2 to place hoyer sling. Hoyer transfer bed to TIS w/c. Pt left semi-reclined in TIS w/c in room with quick-release belt in place, needs in reach.  Therapy Documentation Precautions:  Precautions Precautions: Fall Precaution Comments: R hemi, R inattention Restrictions Weight Bearing Restrictions: No  See Function Navigator for Current Functional Status.   Therapy/Group: Co-Treatment  Excell Seltzer, PT, DPT  02/16/2018, 12:08 PM

## 2018-02-16 NOTE — Progress Notes (Signed)
Physical Therapy Session Note  Patient Details  Name: Adam Yang MRN: [id number] Date of Birth: [birth year]  Today's Date: 02/16/2018 PT Individual Time: 1430-1530 PT Individual Time Calculation (min): 60 min   Short Term Goals: Week 2:  PT Short Term Goal 1 (Week 2): Pt will complete bed<>w/c with max assist +1. PT Short Term Goal 2 (Week 2): Pt will complete bed mobility with mod assist overall. PT Short Term Goal 3 (Week 2): Pt will initiate w/c mobility.  Skilled Therapeutic Interventions/Progress Updates:    Pt received semi-reclined in bed. Per family report BP has been running low this PM, 115/54 in LUE in semi-reclined position. RN aware of low BP this PM. Deferred out of bed mobility secondary to low BP and pt appearing lethargic. Pt is able to acknowledge therapist but needs cues to keep eyes open during therapy session. Supine R UE/LE PROM stretches in all available planes of motion to prevent contracture. Supine L UE/LE AAROM therex x 10 reps with max encouragement and cueing for pt to initiate exercises. Education with patient's wife about stretches and positioning, review of handouts on patient's wall. Pt left sidelying on L side, needs in reach, bed alarm in place, wife present.  Therapy Documentation Precautions:  Precautions Precautions: Fall Precaution Comments: R hemi, R inattention, psuher, global aphasia Restrictions Weight Bearing Restrictions: No Pain: Pain Assessment Pain Scale: Faces Pain Score: 0-No pain   See Function Navigator for Current Functional Status.   Therapy/Group: Individual Therapy  Excell Seltzer, PT, DPT  02/16/2018, 3:39 PM

## 2018-02-16 NOTE — Progress Notes (Signed)
Physical Therapy Session Note  Patient Details  Name: Adam Yang MRN: [id number] Date of Birth: April 12, 1946  Today's Date: 02/16/2018 PT Individual Time: 1310-1340 PT Individual Time Calculation (min): 30 min  and Today's Date: 02/16/2018 PT Missed Time: 15 Minutes Missed Time Reason: Nursing care  Short Term Goals: Week 2:  PT Short Term Goal 1 (Week 2): Pt will complete bed<>w/c with max assist +1. PT Short Term Goal 2 (Week 2): Pt will complete bed mobility with mod assist overall. PT Short Term Goal 3 (Week 2): Pt will initiate w/c mobility.  Skilled Therapeutic Interventions/Progress Updates:   Missed 15 min of skilled PT 2/2 nursing care at beginning of session. No evidence of pain at rest. Session focused on pt engagement and initiation of functional tasks. Worked on sit<>stands at standing frame. Stood 5 min and 3 min w/ max assist overall to maintain postural control and midline orientation w/ tactile, verbal and manual facilitation. Attempted to perform reaching tasks w/ LUE in L visual field, able to reach for cup in 1/5 attempts. Pt noted to be soiled w/ urine, returned to room and performed slide board transfer to EOB, total assst x2. Ended session in supine and in care of NT, all needs met.   Therapy Documentation Precautions:  Precautions Precautions: Fall Precaution Comments: R hemi, R inattention, psuher, global aphasia Restrictions Weight Bearing Restrictions: No Pain: Pain Assessment Pain Scale: Faces Pain Score: 0-No pain  See Function Navigator for Current Functional Status.   Therapy/Group: Individual Therapy  Saunders [place] K Arnette 02/16/2018, 1:45 PM

## 2018-02-17 ENCOUNTER — Inpatient Hospital Stay (HOSPITAL_COMMUNITY): Payer: Medicare Other

## 2018-02-17 ENCOUNTER — Inpatient Hospital Stay (HOSPITAL_COMMUNITY): Payer: Medicare Other | Admitting: Speech Pathology

## 2018-02-17 ENCOUNTER — Ambulatory Visit: Payer: Medicare Other | Admitting: Hematology and Oncology

## 2018-02-17 DIAGNOSIS — I951 Orthostatic hypotension: Secondary | ICD-10-CM

## 2018-02-17 NOTE — Progress Notes (Signed)
Speech Language Pathology Weekly Progress and Session Note  Patient Details  Name: Adam Yang MRN: [id number] Date of Birth: 12/23/1945  Beginning of progress report period: February 10, 2018 End of progress report period: February 16, 2018  Today's Date: 02/17/2018  SLP Individual Time: 10:22-1045 SLP Time Calculation: 23 min    Short Term Goals: Week 2: SLP Short Term Goal 1 (Week 2): Pt will utilize multimodal means to communicate basic wants and needs in 75% of opportunties with Mod A cues.  SLP Short Term Goal 1 - Progress (Week 2): Not met SLP Short Term Goal 2 (Week 2): Pt answer yes/no questions using multimodal communication in 5 out of 10 opportunities with Max A cues.  SLP Short Term Goal 2 - Progress (Week 2): Not met SLP Short Term Goal 3 (Week 2): Pt will imitate common sounds/ phrases with multimodal cues and Max A level of support.  SLP Short Term Goal 3 - Progress (Week 2): Not met SLP Short Term Goal 4 (Week 2): Pt will complete basic problem solving tasks related to ADLs with Min A cues.  SLP Short Term Goal 4 - Progress (Week 2): Not met SLP Short Term Goal 5 (Week 2): Pt will perform pharyngeal strengthening exercises with Max A cues.  SLP Short Term Goal 5 - Progress (Week 2): Not met SLP Short Term Goal 6 (Week 2): Given oral stim, pt will initiate timely swallow with Max A cues to perserve function and for participation in dysphagia therpay.  SLP Short Term Goal 6 - Progress (Week 2): Not met    New Short Term Goals: Week 3: SLP Short Term Goal 1 (Week 3): With Total A, pt will initiate appropriate action with an ADL object (i.e. washcloth etc) to demonstrate function of object in 1 of 5 opportunities.  SLP Short Term Goal 2 (Week 3): With Total A cues, pt will demonstrate joint atteniton to high interest picture/object for ~ 5 seconds in 1 of 5 opportunities.  SLP Short Term Goal 3 (Week 3): With Total A, pt will produce voicing and attempt to sing common song  (Amazing Shirlee [place], Happy Birthday) in 1 of 5 opportunities.  SLP Short Term Goal 4 (Week 3): With Total A, pt will participate in prefunctional dysphagia activities (oral stim, strengthening exercises) in 1 of 5 opportunities.   Weekly Progress Updates: Pt has not made any functional progress this reporting period d/t increased fatigue and inability to participate in intense ST sessions/inpatient rehab program. Pt is currently dependent +2 with all activities including pre-emergent language, pre-emergent cognitive and re-emergent dysphagia abilities. Both STG and LTG have been changed ot reflect current abilities and pre-emergent deficits.      Daily Session  Skilled Therapeutic Interventions: Skilled treatment session focused on cognition goals. SLP facilitated session with PT d/t complexity and severity of pt's deficits. Pt was dependent +2 for bed mobility, supine to sit edge of bed, focused attention to object placed in front of him, reaching for object, donning pants and hoyer lift to tis chair. Pt with moaning throughout session. Pt left upright/tilted back for comfort in tilt in space chair, safety belt donned and all needs within reach.      Function:     Cognition Comprehension Comprehension assist level: Understands basic less than 25% of the time/ requires cueing >75% of the time  Expression   Expression assist level: Expresses basis less than 25% of the time/requires cueing >75% of the time.  Social Interaction Social Interaction assist  level: Interacts appropriately less than 25% of the time. May be withdrawn or combative.  Problem Solving Problem solving assist level: Solves basic less than 25% of the time - needs direction nearly all the time or does not effectively solve problems and may need a restraint for safety  Memory Memory assist level: Recognizes or recalls less than 25% of the time/requires cueing greater than 75% of the time   General    Pain    Therapy/Group:  Individual Therapy  Gilberto Stanforth 02/17/2018, 6:30 AM

## 2018-02-17 NOTE — Plan of Care (Signed)
  Problem: RH BOWEL ELIMINATION Goal: RH STG MANAGE BOWEL WITH ASSISTANCE Description STG Manage Bowel with min Assistance.  02/17/2018 0413 by Etheleen Nicks, RN Outcome: Not Progressing 02/17/2018 0413 by Etheleen Nicks, RN Outcome: Not Progressing Goal: RH STG MANAGE BOWEL W/MEDICATION W/ASSISTANCE Description STG Manage Bowel with Medication with min Assistance.  02/17/2018 0413 by Etheleen Nicks, RN Outcome: Not Progressing 02/17/2018 0413 by Etheleen Nicks, RN Outcome: Not Progressing   Problem: RH BLADDER ELIMINATION Goal: RH STG MANAGE BLADDER WITH ASSISTANCE Description STG Manage Bladder With min Assistance  02/17/2018 0413 by Etheleen Nicks, RN Outcome: Not Progressing 02/17/2018 0413 by Etheleen Nicks, RN Outcome: Not Progressing Goal: RH STG MANAGE BLADDER WITH MEDICATION WITH ASSISTANCE Description STG Manage Bladder With Medication With min Assistance.  02/17/2018 0413 by Etheleen Nicks, RN Outcome: Not Progressing 02/17/2018 0413 by Etheleen Nicks, RN Outcome: Not Progressing   Problem: RH SKIN INTEGRITY Goal: RH STG SKIN FREE OF INFECTION/BREAKDOWN 02/17/2018 0413 by Etheleen Nicks, RN Outcome: Not Progressing 02/17/2018 0413 by Etheleen Nicks, RN Outcome: Not Progressing Goal: RH STG MAINTAIN SKIN INTEGRITY WITH ASSISTANCE Description STG Maintain Skin Integrity With min Assistance.  02/17/2018 0413 by Etheleen Nicks, RN Outcome: Not Progressing 02/17/2018 0413 by Etheleen Nicks, RN Outcome: Not Progressing   Pt not progressing with min assist goals, pt requires total assist

## 2018-02-17 NOTE — Progress Notes (Addendum)
Physical Therapy Weekly Progress Note  Patient Details  Name: Adam Yang MRN: [id number] Date of Birth: 11/05/1945  Beginning of progress report period: February 10, 2018 End of progress report period: February 17, 2018  Today's Date: 02/17/2018 PT Individual Time: 8182-9937 PT Individual Time Calculation (min): 28 min   Patient has met 0 of 3 short term goals.  Pt is making very minimal progress towards LTG's as he is limited by lethargy, fatigue, and decreased activity tolerance. Pt currently requires +2 dependent assist for all mobility 2/2 deficits following medical event. Pt would benefit from continued skilled PT treatment to focus on family education and to increase pt's activity tolerance, endurance, bed mobility, transfers, and R NMR.   Patient continues to demonstrate the following deficits muscle weakness, decreased cardiorespiratoy endurance, decreased coordination and decreased motor planning, decreased visual perceptual skills, decreased attention to right, decreased initiation, decreased attention, decreased awareness, decreased problem solving, decreased safety awareness, decreased memory and delayed processing, and decreased sitting balance, decreased standing balance, decreased postural control, hemiplegia and decreased balance strategies and therefore will continue to benefit from skilled PT intervention to increase functional independence with mobility.  Patient not progressing toward long term goals.  See goal revision..  Plan of care revisions: .Pt's goals have been downgraded to total assist overall for bed mobility, sitting balance, and bed<>chair with goals focusing on pt's participation level with cuing from therapist.   PT Short Term Goals Week 2:  PT Short Term Goal 1 (Week 2): Pt will complete bed<>w/c with max assist +1. PT Short Term Goal 1 - Progress (Week 2): Not met PT Short Term Goal 2 (Week 2): Pt will complete bed mobility with mod assist overall. PT Short Term  Goal 2 - Progress (Week 2): Not met PT Short Term Goal 3 (Week 2): Pt will initiate w/c mobility. PT Short Term Goal 3 - Progress (Week 2): Not met Week 3:  PT Short Term Goal 1 (Week 3): STG = LTG due to anticipated SNF placement.  Skilled Therapeutic Interventions/Progress Updates:  Pt received in w/c. Pt not outwardly demonstrating behaviors indicative of pain; pt groaning occasionally & therapist assisting with repositioning during task to increase comfort. Pt transported to dayroom via w/c total assist. Pt requires total assist for anterior weight shift in w/c to allow standing frame sling to be placed via dependent assist. Pt tolerated standing frame for ~3 minutes with dependent assist for sit<>stand and task focusing on midline orientation, R NMR & weight bearing, trunk control & strengthening & activity tolerance. Pt pushes R with LUE/LLE and therapist attempts to assist pt by holding his LUE. Pt with very little progressing to no engagement in task, eventually leaning backwards and unable to assist with anterior weight shifting even with total assist. Pt returned to w/c and attempted to engage him in one step commands (removing sling, pointing to where he wanted his music tablet placed) but pt not participating. At end of session pt left sitting in w/c in room with alarm belt donned & music playing to increase arousal.   Spoke with PA Linna Hoff) and pt's schedule now reduced to QD.  Therapy Documentation Precautions:  Precautions Precautions: Fall Precaution Comments: R hemi, R inattention, psuher, global aphasia Restrictions Weight Bearing Restrictions: No   General: PT Amount of Missed Time (min): 32 Minutes PT Missed Treatment Reason: Patient unwilling to participate;Patient fatigue(no active engagement in task)   See Function Navigator for Current Functional Status.  Therapy/Group: Individual Therapy  Waunita Schooner  02/17/2018, 12:19 PM

## 2018-02-17 NOTE — Progress Notes (Signed)
Speech Language Pathology Daily Session Note  Patient Details  Name: Adam Yang MRN: [id number] Date of Birth: Feb 26, 1946  Today's Date: 02/17/2018 SLP Individual Time: 3903-0092 SLP Individual Time Calculation (min): 8 min  Short Term Goals: Week 3: SLP Short Term Goal 1 (Week 3): With Total A, pt will initiate appropriate action with an ADL object (i.e. washcloth etc) to demonstrate function of object in 1 of 5 opportunities.  SLP Short Term Goal 2 (Week 3): With Total A cues, pt will demonstrate joint atteniton to high interest picture/object for ~ 5 seconds in 1 of 5 opportunities.  SLP Short Term Goal 3 (Week 3): With Total A, pt will produce voicing and attempt to sing common song (Amazing Shirlee [place], Happy Birthday) in 1 of 5 opportunities.  SLP Short Term Goal 4 (Week 3): With Total A, pt will participate in prefunctional dysphagia activities (oral stim, strengthening exercises) in 1 of 5 opportunities.   Skilled Therapeutic Interventions:SLP received pt upright in tilt-in-space wheelchair asleep. With cues, pt able to arouse but then immediately closed eyes. Several attempts made but pt continued to keep eyes closed. Pt is now QD for therapy therefore session discontinued and pt missed 21 minutes of skilled ST.      Function:    Cognition Comprehension Comprehension assist level: Understands basic less than 25% of the time/ requires cueing >75% of the time  Expression   Expression assist level: Expresses basis less than 25% of the time/requires cueing >75% of the time.  Social Interaction Social Interaction assist level: Interacts appropriately less than 25% of the time. May be withdrawn or combative.  Problem Solving Problem solving assist level: Solves basic less than 25% of the time - needs direction nearly all the time or does not effectively solve problems and may need a restraint for safety  Memory Memory assist level: Recognizes or recalls less than 25% of the time/requires  cueing greater than 75% of the time    Pain    Therapy/Group: Individual Therapy  Jada Kuhnert 02/17/2018, 2:24 PM

## 2018-02-17 NOTE — Progress Notes (Signed)
Speech Language Pathology Daily Session Notes  Patient Details  Name: Adam Yang MRN: [id number] Date of Birth: 04/06/1946  Today's Date: 02/17/2018  Session 1: SLP Individual Time: 0730-0800 SLP Individual Time Calculation (min): 30 min and  SLP Missed Time: 30 Minutes Missed Time Reason: Patient unwilling to participate   Session 2: SLP Individual Time: 1030-1040 SLP Individual Time Calculation (min): 10 min and  SLP Missed Time: 20 Minutes Missed Time Reason: Patient unwilling to participate  Short Term Goals: Week 3: SLP Short Term Goal 1 (Week 3): With Total A, pt will initiate appropriate action with an ADL object (i.e. washcloth etc) to demonstrate function of object in 1 of 5 opportunities.  SLP Short Term Goal 2 (Week 3): With Total A cues, pt will demonstrate joint atteniton to high interest picture/object for ~ 5 seconds in 1 of 5 opportunities.  SLP Short Term Goal 3 (Week 3): With Total A, pt will produce voicing and attempt to sing common song (Amazing Shirlee [place], Happy Birthday) in 1 of 5 opportunities.  SLP Short Term Goal 4 (Week 3): With Total A, pt will participate in prefunctional dysphagia activities (oral stim, strengthening exercises) in 1 of 5 opportunities.   Skilled Therapeutic Interventions:  Session 1: Skilled treatment session focused on cognitive goals. Upon arrival, patient was awake while upright in bed. However, once clinician asked patient to participate in treatment session, his eyes remained closed.  Patient resistive to hand over hand assist for basic self-care tasks (washing face, brushing teeth with suction toothbrush, donning glasses) and became slightly agitated. Wife present and attempting to assist without success. Therefore, session ended 30 minutes early. Patient left upright in bed with alarm on and all needs within reach. Continue with current plan of care.  Session 2: Skilled treatment session focused on cognitive goals. Upon arrival, patient was  awake while upright in the wheelchair. However, once clinician began cognitive-linguistic tasks, patient closed his eyes and declined to participate in session despite Max encouragement and multimodal cues.  Patient answered basic yes/no questions in regards to wants/needs in 10% of opportunities and declined all attempts at hand over hand assist for joint attention tasks. Therefore, patient missed remaining 20 minutes of session. Patient left upright in wheelchair with alarm on and all needs within reach. Continue with current plan of care.   Function:   Cognition Comprehension Comprehension assist level: Understands basic less than 25% of the time/ requires cueing >75% of the time  Expression   Expression assist level: Expresses basis less than 25% of the time/requires cueing >75% of the time.  Social Interaction Social Interaction assist level: Interacts appropriately less than 25% of the time. May be withdrawn or combative.  Problem Solving Problem solving assist level: Solves basic less than 25% of the time - needs direction nearly all the time or does not effectively solve problems and may need a restraint for safety  Memory Memory assist level: Recognizes or recalls less than 25% of the time/requires cueing greater than 75% of the time    Pain Pain Assessment Faces Pain Scale: No hurt  Therapy/Group: Individual Therapy  Wilene Pharo 02/17/2018, 12:09 PM

## 2018-02-17 NOTE — Progress Notes (Signed)
Subjective/Complaints:  Pt in bed. Eyes open. Not engaging much with staff. Wife at bedside. Had questions about prognosis and bp's  ROS: limited due to language/communication   Objective: Vital Signs: Blood pressure (!) 152/67, pulse 84, temperature 98.3 F (36.8 C), temperature source Oral, resp. rate 12, height 5\' 9"  (1.753 m), weight 60.6 kg, SpO2 95 %. No results found. No results found for this or any previous visit (from the past 72 hour(s)).  Constitutional: No distress . Vital signs reviewed. HEENT: EOMI, oral membranes moist Neck: supple Cardiovascular: RRR without murmur. No JVD    Respiratory: CTA Bilaterally without wheezes or rales. Normal effort    GI: BS +, non-tender, non-distended  Skin:   Intact. Warm and dry. Neuro: Globally aphasia, fairly alert.  tends to right side somewhat. Motor:   0/5 Right side Musc/Skel:  No edema or tenderness in extremities Psych: flat  Assessment/Plan: 1. 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 Physiatrist is providing close team supervision and 24 hour management of active medical problems listed below. Physiatrist and rehab team continue to assess barriers to discharge/monitor patient progress toward functional and medical goals. FIM: Function - Bathing Position: Wheelchair/chair at sink Body parts bathed by patient: Chest, Right arm Body parts bathed by helper: Left arm, Front perineal area, Buttocks, Right upper leg, Left upper leg, Right lower leg, Left lower leg, Back, Abdomen Assist Level: 2 helpers  Function- Upper Body Dressing/Undressing What is the patient wearing?: Pull over shirt/dress Pull over shirt/dress - Perfomed by patient: Thread/unthread right sleeve Pull over shirt/dress - Perfomed by helper:  Thread/unthread left sleeve, Put head through opening, Pull shirt over trunk Button up shirt - Perfomed by helper: Thread/unthread left sleeve, Pull shirt around back Assist Level: 2 helpers Function - Lower Body Dressing/Undressing What is the patient wearing?: Pants, Socks, Shoes Position: Bed Pants- Performed by helper: Thread/unthread left pants leg, Pull pants up/down, Thread/unthread right pants leg Non-skid slipper socks- Performed by helper: Don/doff right sock, Don/doff left sock Socks - Performed by helper: Don/doff right sock, Don/doff left sock Shoes - Performed by helper: Don/doff right shoe, Don/doff left shoe, Fasten right, Fasten left Assist for footwear: Dependant Assist for lower body dressing: 2 Helpers  Function - Toileting Toileting activity did not occur: No continent bowel/bladder event Assist level: Two helpers  Function - Air cabin crew transfer activity did not occur: Safety/medical concerns  Function - Chair/bed transfer Chair/bed transfer method: Other Chair/bed transfer assist level: 2 helpers Chair/bed transfer assistive device: Mechanical lift Mechanical lift: Maximove Chair/bed transfer details: Tactile cues for initiation, Tactile cues for sequencing, Tactile cues for weight shifting, Tactile cues for posture, Tactile cues for placement, Verbal cues for technique, Verbal cues for precautions/safety, Manual facilitation for weight shifting, Manual facilitation for placement  Function - Locomotion: Wheelchair Will patient use wheelchair at discharge?: Yes Wheelchair activity did not occur: Safety/medical concerns Assist Level: Dependent (Pt equals 0%) Wheel 50 feet with 2 turns activity did not occur: Safety/medical concerns Wheel 150 feet activity did not occur: Safety/medical concerns Turns around,maneuvers to table,bed, and toilet,negotiates 3% grade,maneuvers on rugs and over doorsills: No Function - Locomotion: Ambulation Assistive  device: (3 muskateers + w/c follow) Max distance: 3 ft Assist level: 2 helpers Walk 10 feet activity did not occur: Safety/medical concerns Walk 50 feet with 2 turns activity did not occur: Safety/medical  concerns Walk 150 feet activity did not occur: Safety/medical concerns Walk 10 feet on uneven surfaces activity did not occur: Safety/medical concerns  Function - Comprehension Comprehension: Auditory Comprehension assist level: Understands basic less than 25% of the time/ requires cueing >75% of the time  Function - Expression Expression: Nonverbal(moaning only) Expression assistive device: Other (Comment) Expression assist level: Expresses basis less than 25% of the time/requires cueing >75% of the time.  Function - Social Interaction Social Interaction assist level: Interacts appropriately less than 25% of the time. May be withdrawn or combative.  Function - Problem Solving Problem solving assist level: Solves basic less than 25% of the time - needs direction nearly all the time or does not effectively solve problems and may need a restraint for safety  Function - Memory Memory assist level: Recognizes or recalls less than 25% of the time/requires cueing greater than 75% of the time Patient normally able to recall (first 3 days only): None of the above  Medical Problem List and Plan: 1.Right side weakness with facial droop and dysphasiasecondary to large left frontoparietal hemorrhagic infarction with vasogenic edema and mass-effect  Continue CIR minimal progression, would rec SNF given 2+ assist level  -therapy decreased to 15/7 due to poor therapy tolerance  -Improved arousal with Ritalin   2. DVT Prophylaxis/Anticoagulation: SCDs. Monitor for any signs of DVT 3. Pain Management:Zanaflex 4 mg nightly--changed to as needed due to morning lethargy 4. Mood:Provide emotional support  -Likely some reactive depression but difficult to assess given his cognitive linguistic  deficits---> consider antidepressant (discussed with wife today, no decision re: starting medication made today) 5. Neuropsych: This patientis notcapable of making decisions on hisown behalf. 6. Skin/Wound Care:Routine skin checks 7. Fluids/Electrolytes/Nutrition:Routine in and outs 8.Dysphagia: PEG placed 7/28 by INR  -follow for healing 9.Hypertension.   Lisinopril 20 mg daily, Norvasc 10 mg daily. Monitor with increased mobility  Blood pressures trending back up, but BUN/Cr rising  Vitals:   02/16/18 2043 02/17/18 0616  BP: (!) 146/72 (!) 152/67  Pulse: 86 84  Resp: 17 12  Temp: 97.8 F (36.6 C) 98.3 F (36.8 C)  SpO2: 91% 95%    Metoprolol increased, pt having drops when up.   -has hx of orthostasis, likely related to throat radiation  -discussed at length with wife  -will d/c lisinopril, continue metoprolol dosing  -tolerate higher supine bp's to prevent drops while up  -abd binder/TEDS  -adequate volume 10.Diabetes mellitus. Hemoglobin A1c 6.0.   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 12.Hyperlipidemia. Fenofibrate. 13. Hypernatremia- resolved Na+ 136 on 8/5  Cont free H20 flush 14. AKI on CKD  BUN/Creatinine still elevated 45/1.7  - may be associated with ACE I---lisinopril d/ced as above  -off IVF since 7/31   -H20 flushes currently 200cc q3 15. Anemia of chronic disease  Hemoglobin 12.4 on 7/30, 11.3- stool neg 8/5  Cont to monitor   13min spent in direct patient/family contact. Greater than 50% of time during this encounter was spent counseling patient/family in regard to prognosis, bp and medication considerations.  .   LOS (Days) [place] EVALUATION WAS PERFORMED  Meredith Staggers 02/17/2018, 9:49 AM

## 2018-02-17 NOTE — Progress Notes (Signed)
Physical Therapy Note  Patient Details  Name: Adam Yang MRN: [id number] Date of Birth: 05/01/1946 Today's Date: 02/17/2018   Pt's plan of care adjusted to QD after speaking with care team and discussed with PA Linna Hoff) as pt currently unable to tolerate current therapy schedule of 15/7 with OT, PT, and SLP.      Waunita Schooner 02/17/2018, 12:13 PM

## 2018-02-17 NOTE — Plan of Care (Signed)
  Problem: RH BOWEL ELIMINATION Goal: RH STG MANAGE BOWEL W/MEDICATION W/ASSISTANCE Description STG Manage Bowel with Medication with min Assistance.  Outcome: Progressing   Problem: RH BLADDER ELIMINATION Goal: RH STG MANAGE BLADDER WITH MEDICATION WITH ASSISTANCE Description STG Manage Bladder With Medication With min Assistance.  Outcome: Progressing   Problem: RH SKIN INTEGRITY Goal: RH STG SKIN FREE OF INFECTION/BREAKDOWN Outcome: Progressing

## 2018-02-17 NOTE — Progress Notes (Signed)
Occupational Therapy Session Note  Patient Details  Name: Adam Yang MRN: [id number] Date of Birth: 1946-06-18  Today's Date: 02/17/2018 OT Individual Time: 6381-7711 OT Individual Time Calculation (min): 57 min    Short Term Goals: Week 2:  OT Short Term Goal 1 (Week 2): Pt will complete sit > stand with mod assist to decrease burden of care with LB bathing/dressing OT Short Term Goal 2 (Week 2): Pt will complete bathing with max assist with mod cues for sequencing OT Short Term Goal 3 (Week 2): Pt will complete UB dressing with max assist OT Short Term Goal 4 (Week 2): Pt will complete toilet transfer with max assist of 1 caregiver  Skilled Therapeutic Interventions/Progress Updates:    1;1. No pain indicated. Pt reluctant and at times resistant to functional mobility this date. Pt incontinent of bladder. OT facilitates rolling B with MAXA of 2 to wash buttocks. Pt able to wash LLE with A to initate/cue. OT dons pants total A. Pt completes bed mobility/slide board transfer to chair total A +2 with significant pushing. Attempted to engage pt with striking keys on piano. OT plays piano songs on keyboard and OT stretching/PROM on RUE focusing on shoulder to decrease tone prevent contracture. Pt begins tapping LUE and foot to music. Exited session with pt seated in TIS, call light in reach, wife present and belt alarm on.   Therapy Documentation Precautions:  Precautions Precautions: Fall Precaution Comments: R hemi, R inattention, psuher, global aphasia Restrictions Weight Bearing Restrictions: No General:   Vital Signs: Therapy Vitals Temp: 98.3 F (36.8 C) Temp Source: Oral Pulse Rate: 84 Resp: 12 BP: (!) 152/67(rn notified) Patient Position (if appropriate): Lying Oxygen Therapy SpO2: 95 % O2 Device: Room Air  See Function Navigator for Current Functional Status.   Therapy/Group: Individual Therapy  [name] 02/17/2018, 10:01 AM

## 2018-02-17 NOTE — Progress Notes (Signed)
Social Work Patient ID: Adam Yang, male   DOB: [birth year], 72 y.o.   MRN: [id number]  Met with wife to discuss team conference results, concerns and discharge plans. She voiced concerns over his BP and the spascity meds not helping. She has spoken with the covering MD this am regarding this. She acknowledges this is too much for him. Discussed palliative care consult if this would be helpful and she is aware of what it is and is not sure if if would be helpful. She feels her daughter is looking at the positives and not seeing how bad he really is. She is aware of the severity of his stroke and deficits. She has a strong faith and relies upon this. She wonders what Gods plan is for [place]. Allowed wife to ventilate and listened to her concerns. She is agreeable to begin the NH search and would like [facility] as her top choice. Will do [facility] and see if they would be willing to take him and then begin the insurance approval process. Wife to think about the palliative consult.

## 2018-02-18 ENCOUNTER — Inpatient Hospital Stay (HOSPITAL_COMMUNITY): Payer: Medicare Other | Admitting: Physical Therapy

## 2018-02-18 ENCOUNTER — Inpatient Hospital Stay (HOSPITAL_COMMUNITY): Payer: Medicare Other

## 2018-02-18 DIAGNOSIS — J189 Pneumonia, unspecified organism: Secondary | ICD-10-CM

## 2018-02-18 LAB — BASIC METABOLIC PANEL
ANION GAP: 11 (ref 5–15)
BUN: 45 mg/dL — AB (ref 8–23)
CALCIUM: 9.2 mg/dL (ref 8.9–10.3)
CO2: 29 mmol/L (ref 22–32)
Chloride: 96 mmol/L — ABNORMAL LOW (ref 98–111)
Creatinine, Ser: 1.91 mg/dL — ABNORMAL HIGH (ref 0.61–1.24)
GFR calc Af Amer: 39 mL/min — ABNORMAL LOW (ref >60)
GFR, EST NON AFRICAN AMERICAN: 34 mL/min — AB (ref >60)
GLUCOSE: 124 mg/dL — AB (ref 70–99)
POTASSIUM: 4.1 mmol/L (ref 3.5–5.1)
SODIUM: 136 mmol/L (ref 135–145)

## 2018-02-18 LAB — HEPATIC FUNCTION PANEL
ALT: 21 U/L (ref 0–44)
AST: 24 U/L (ref 15–41)
Albumin: 2.4 g/dL — ABNORMAL LOW (ref 3.5–5.0)
Alkaline Phosphatase: 51 U/L (ref 38–126)
BILIRUBIN DIRECT: 0.2 mg/dL (ref 0.0–0.2)
BILIRUBIN INDIRECT: 0.3 mg/dL (ref 0.3–0.9)
TOTAL PROTEIN: 7.3 g/dL (ref 6.5–8.1)
Total Bilirubin: 0.5 mg/dL (ref 0.3–1.2)

## 2018-02-18 LAB — CBC
HEMATOCRIT: 33.7 % — AB (ref 39.0–52.0)
HEMOGLOBIN: 10.9 g/dL — AB (ref 13.0–17.0)
MCH: 25.8 pg — ABNORMAL LOW (ref 26.0–34.0)
MCHC: 32.3 g/dL (ref 30.0–36.0)
MCV: 79.9 fL (ref 78.0–100.0)
Platelets: 331 10*3/uL (ref 150–400)
RBC: 4.22 MIL/uL (ref 4.22–5.81)
RDW: 14.6 % (ref 11.5–15.5)
WBC: 10.1 10*3/uL (ref 4.0–10.5)

## 2018-02-18 MED ORDER — ALBUTEROL SULFATE (2.5 MG/3ML) 0.083% IN NEBU
2.5000 mg | INHALATION_SOLUTION | RESPIRATORY_TRACT | Status: DC
  Administered 2018-02-18 – 2018-02-19 (×3): 2.5 mg via RESPIRATORY_TRACT
  Filled 2018-02-18 (×3): qty 3

## 2018-02-18 MED ORDER — CEFEPIME HCL 2 G IJ SOLR
2.0000 g | INTRAMUSCULAR | Status: DC
  Administered 2018-02-18 – 2018-02-23 (×6): 2 g via INTRAVENOUS
  Filled 2018-02-18 (×6): qty 2

## 2018-02-18 NOTE — Plan of Care (Signed)
  Problem: RH SKIN INTEGRITY Goal: RH STG SKIN FREE OF INFECTION/BREAKDOWN Outcome: Progressing Goal: RH STG MAINTAIN SKIN INTEGRITY WITH ASSISTANCE Description STG Maintain Skin Integrity With min Assistance.  Outcome: Progressing   Problem: RH SAFETY Goal: RH STG ADHERE TO SAFETY PRECAUTIONS W/ASSISTANCE/DEVICE Description STG Adhere to Safety Precautions With min Assistance/Device.  Outcome: Progressing Goal: RH STG DECREASED RISK OF FALL WITH ASSISTANCE Description STG Decreased Risk of Fall With min Assistance.  Outcome: Progressing   Problem: RH PAIN MANAGEMENT Goal: RH STG PAIN MANAGED AT OR BELOW PT'S PAIN GOAL Description Less than 3 out of 10   Outcome: Progressing

## 2018-02-18 NOTE — Progress Notes (Signed)
PHARMACY NOTE:  ANTIMICROBIAL RENAL DOSAGE ADJUSTMENT Current antimicrobial regimen includes a mismatch between antimicrobial dosage and estimated renal function.  As per policy approved by the Pharmacy & Therapeutics and Medical Executive Committees, the antimicrobial dosage will be adjusted accordingly.  Current antimicrobial dosage:  Cefepime 2 grams IV every 8 hours   Indication: PNA   Renal Function:  Estimated Creatinine Clearance: 34.9 mL/min (A) (by C-G formula based on SCr of 1.7 mg/dL (H)).   Antimicrobial dosage has been changed to:  Cefepime 2 grams IV every 24 hours   Thank you for allowing pharmacy to be a part of this patient's care.  Vincenza Hews, PharmD, BCPS 02/18/2018, 10:02 AM

## 2018-02-18 NOTE — Progress Notes (Signed)
+/-   sleep. Restless at times. Spoke with wife at [facility], she's concerned R/T patient's lethargy, feels that meds are causing it. Congested breathing, Scattered rhonchi throughout. Patient refusing to allow staff to suction mouth, pushing hand away. At 0330, PRN tylenol given, O2 sats-93% RA. Other vitals charted. At 0620, congested breathing.  Paged Dr. Asa Lente with events of night. No new orders, will assess on first rounds. Patrici Ranks A

## 2018-02-18 NOTE — Progress Notes (Signed)
Subjective/Complaints:  Patient in bed.  He does open his eyes.  He answers yes and no questions by nodding or shaking his head.  There is an audible rattle with inspiration and expiration.  Objective: Vital Signs: Blood pressure 121/74, pulse 92, temperature (!) 97.5 F (36.4 C), temperature source Axillary, resp. rate (!) 22, height _0  (1.753 m), weight 61.1 kg, SpO2 93 %.  Elderly male in no acute distress.  Respiratory rate by my count is 18. HEENT exam: Atraumatic, thin neck.  Neck is supple without lymphadenopathy or thyromegaly.  Extraocular muscles are intact. Chest without increased work of breathing.  Has inspiratory and expiratory rhonchi.  No wheezing.  There is no increased work of breathing. Cardiac exam S1 and S2 are regular. Abdominal exam active bowel sounds, soft, thin.  G-tube is in place and the surrounding skin is without erythema or exudate. Extremities without edema  Assessment/Plan: 1. 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  I reviewed the notes for recommendation of skilled facility level of care.  It does not appear that he can participate in therapy today.  (02/18/2018) 2. DVT Prophylaxis/Anticoagulation: SCDs. Monitor for any signs of DVT 3. Pain Management:He denies pain currently.  I think it is best to decrease or eliminate all pain medications given my perception of lethargy. 4. Mood:Seems to have a flat affect. 5. Neuropsych: This patientis notcapable of making decisions on hisown behalf. 6. Skin/Wound Care:Routine skin checks 7. Fluids/Electrolytes/Nutrition:We will check labs today. 8.Dysphagia: PEG placed 7/28 by INR  -follow  for healing 9.Hypertension.   Patient currently is on amlodipine and metoprolol.  Blood pressure ranging 121-145/74-77.   Vitals:   02/17/18 1941 02/18/18 0330  BP: 132/66 121/74  Pulse: 91 92  Resp: 20 (!) 22  Temp: 97.6 F (36.4 C) (!) 97.5 F (36.4 C)  SpO2: 92% 93%     -abd binder/TEDS  -adequate volume 10.Diabetes mellitus. Currently on no medications. 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  Reviewed notes from oncology in 2018.  Reviewed previous scans.  No concern for recurrence at this time. 12.Hyperlipidemia.  I think given his current condition it is worth minimizing medications that are unlikely to contribute to longevity or well-being.  Will discontinue fenofibrate. 13. Hypernatremia- resolved Na+  Check labs today. 14. AKI on CKD  BUN/Creatinine still elevated 45/1.7  - may be associated with ACE I---lisinopril d/ced as above  -off IVF since 7/31   -H20 flushes currently 200cc q3 15. Anemia of chronic disease  Hemoglobin 12.4 on 7/30, 11.3- stool neg 8/5  Cont to monitor New concerns: Clinically patient appears more ill than what was portrayed in his previous documentation.  He certainly has pulmonary concerns.  There are no clinical signs of consolidation.  I will check a chest x-ray CBC, being met and liver function panel.  (Later: Chest x-ray returns with evidence of bibasilar pneumonia we will treat for hospital-acquired pneumonia).  Family discussion: I called the patient's wife, Tye [place].  Had a long discussion regarding the patient's apparent clinical worsening.  Patient is already a DO NOT RESUSCITATE.  Juliann Pulse would like the patient's pneumonia to be treated with antibiotics.  She denied agreed that it would not be in his best interest to transfer him  to an intensive care unit if it were to become necessary.  In the patient's DO NOT RESUSCITATE status is correct.  There will be no intubation no CPR, no cardioversions for  a terminal rhythm.  Greater than 45 minutes spent with the patient and family.  25 minutes in counseling.  30mn spent in direct patient/family contact. Greater than 50% of time during this encounter was spent counseling patient/family in regard to prognosis, bp and medication considerations.  .   LOS (Days) 16 A FACE TO FACE EVALUATION WAS PERFORMED  Antuane Eastridge H Kolbi Tofte 02/18/2018, 9:29 AM

## 2018-02-18 NOTE — Progress Notes (Signed)
Spoke with [place], patient's wife, this AM R/T events of the night. She  Appreciated call and will f/u with day shift RN later. She reports, patient had "sleep apnea" like breathing PTA, but congested breathing is new over past couple days.Valere Dross, Gerald Leitz

## 2018-02-19 ENCOUNTER — Inpatient Hospital Stay (HOSPITAL_COMMUNITY): Payer: Medicare Other | Admitting: *Deleted

## 2018-02-19 MED ORDER — ALBUTEROL SULFATE (2.5 MG/3ML) 0.083% IN NEBU
2.5000 mg | INHALATION_SOLUTION | Freq: Three times a day (TID) | RESPIRATORY_TRACT | Status: DC
  Administered 2018-02-19 – 2018-02-22 (×11): 2.5 mg via RESPIRATORY_TRACT
  Filled 2018-02-19 (×12): qty 3

## 2018-02-19 NOTE — Progress Notes (Signed)
RT called by RN to NT suction pt per MD order. Suctioned copious amounts of yellow thick secretions and pt BBS much better with decreased rhonchi throughout. After NT sx pt WOB decreased and VS stable.

## 2018-02-19 NOTE — Progress Notes (Signed)
Pt alert this morning. O2 sats in low 90's between 92-94 with 2 L O2 [place]. Pt has very weak cough with rattling sound. Pt refused to let RN attempt to suction. Pt given albuterol treatment. Will continue to monitor.

## 2018-02-19 NOTE — Progress Notes (Signed)
At 2200,  Congested with audible rattling when breathing. Paged Dr. Leanne Chang, orders for  neb treatments every 4 hours WA. Patient pushing at staff when attempting to suction mouth. PRN tylenol given at 2030, [place] and 0425. Neb treatments given at 2200 and 0420. Patrici Ranks A

## 2018-02-19 NOTE — Progress Notes (Signed)
Subjective/Complaints:  Patient in bed.  He seems more alert this morning.  His eyes are open.  He does not his head yes or shake his head no when asked questions.  He seems to deny pain.  Objective: Vital Signs: Blood pressure 93/62, pulse 88, temperature 99.5 F (37.5 C), temperature source Axillary, resp. rate 16, height 5\' 9"  (1.753 m), weight 60.3 kg, SpO2 92 %.  Elderly male in no acute distress.  Respiratory rate by my count is 20. HEENT exam: Atraumatic, thin neck.  Neck is supple without lymphadenopathy or thyromegaly.  Extraocular muscles are intact. Chest without increased work of breathing.  Has inspiratory and expiratory rhonchi.  No wheezing.  There is no increased work of breathing. Cardiac exam S1 and S2 are regular. Abdominal exam active bowel sounds, soft, thin.  G-tube is in place and the surrounding skin is without erythema or exudate. Extremities without edema  Assessment/Plan: 1. 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  His rehabilitation has been slowed down by development of pneumonia.  It appears that long-term plans were for skilled facility placement. 2. DVT Prophylaxis/Anticoagulation: SCDs. Monitor for any signs of DVT 3. Pain Management:Pain medications have been discontinued for concern of increasing lethargy. 4. Mood:Seems to have a flat affect. 5. Neuropsych: This patientis notcapable of making decisions on hisown behalf. 6. Skin/Wound Care:Routine skin checks 7. Fluids/Electrolytes/Nutrition:We will check labs today. 8.Dysphagia: PEG placed 7/28 by INR  -follow for healing, no concerns. 9.Hypertension.   Patient currently is on  amlodipine and metoprolol.  Blood pressure is variable ranging from 93-172/62-72.  We will continue to follow for now.   Vitals:   02/19/18 0550 02/19/18 0800  BP:    Pulse:    Resp:    Temp:    SpO2: 92% 92%     -abd binder/TEDS  -adequate volume 10.Diabetes mellitus. Currently on no medications. 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  Reviewed notes from oncology in 2018.  Reviewed previous scans.  No concern for recurrence at this time. 12.Hyperlipidemia.  I think given his current condition it is worth minimizing medications that are unlikely to contribute to longevity or well-being.  Will discontinue fenofibrate. 13. Hypernatremia-  Basic Metabolic Panel:    Component Value Date/Time   NA 136 02/18/2018 0922   NA 139 02/18/2017 1502   K 4.1 02/18/2018 0922   K 4.3 02/18/2017 1502   CL 96 (L) 02/18/2018 0922   CO2 29 02/18/2018 0922   CO2 27 02/18/2017 1502   BUN 45 (H) 02/18/2018 0922   BUN 30.9 (H) 02/18/2017 1502   CREATININE 1.91 (H) 02/18/2018 0922   CREATININE 1.6 (H) 02/18/2017 1502   GLUCOSE 124 (H) 02/18/2018 0922   GLUCOSE 108 02/18/2017 1502   CALCIUM 9.2 02/18/2018 0922   CALCIUM 9.6 02/18/2017 1502    14. AKI on CKD  He is off IV fluids.  Note creatinine has increased slightly.  We will recheck tomorrow. 15. Anemia of chronic disease CBC:    Component Value Date/Time   WBC 10.1 02/18/2018 0922   HGB 10.9 (L) 02/18/2018 0922   HGB 12.2 (L) 02/18/2017 1503   HCT 33.7 (L) 02/18/2018 0922   HCT 35.9 (L) 02/18/2017 1503   PLT 331 02/18/2018  0922   PLT 254 02/18/2017 1503   MCV 79.9 02/18/2018 0922   MCV 84.1 02/18/2017 1503   NEUTROABS 6.3 02/03/2018 0551   NEUTROABS 3.6 02/18/2017 1503   LYMPHSABS 1.0 02/03/2018 0551   LYMPHSABS 1.2 02/18/2017 1503   MONOABS 0.8 02/03/2018 0551   MONOABS 0.7 02/18/2017 1503   EOSABS 0.6 02/03/2018 0551   EOSABS 0.3 02/18/2017 1503   BASOSABS 0.1 02/03/2018 0551    BASOSABS 0.0 02/18/2017 1503     New concerns: Healthcare acquired pneumonia.  He is on cefepime.  Appreciate pharmacy dosing.  We will continue current dose.  I do not think he needs additional antibiotics for healthcare acquired pneumonia.  Clinically he does not have MRSA pneumonia.  PCR testing for MRSA was negative.  I do not think he has Pseudomonas pneumonia and does not need to antibiotics to cover Pseudomonas.  Family discussion: Attempted to call patient's wife this morning.  She did not answer her cell phone.  I did get a family member at the home number.  I did not discuss details of patient's care at this time.  25 minutes in counseling.  .   LOS (Days) 17 A FACE TO FACE EVALUATION WAS PERFORMED  Kylani Wires H Tanyah Debruyne 02/19/2018, 8:13 AM

## 2018-02-19 NOTE — Progress Notes (Addendum)
Physical Therapy Session Note  Patient Details  Name: Adam Yang MRN: [id number] Date of Birth: 1946-05-05  Today's Date: 02/19/2018 PT Individual Time: 7543-6067 PT Individual Time Calculation (min): 30 min   Short Term Goals: Week 3:  PT Short Term Goal 1 (Week 3): STG = LTG due to anticipated SNF placement.  Skilled Therapeutic Interventions/Progress Updates:    Pt sleeping in bed upon arrival. RN and PT discuss risks/benefits of OOB today due to PNA and not OOB yesterday, decided OOB to TIS would be supportive and likely assist respiratory function, pressure relief.  Rolling R/L x2 total A, with manual facilitation throughout to increase NMR and active movement. Donning pants, total A.  Supine>sit total A. Pt sat EOB x7min with Max A to prevent R pushing. Multi-modal cues throughout for UE placement and use. Postural control via manual facilitation.  Stand-pivot transfer +2 assist to TIS. Trunk facilitation and cues for support through LEs. All extremities supported, midline positioning.  Pt left up in [facility] with all needs in reach.   Therapy Documentation Precautions:  Precautions Precautions: Fall Precaution Comments: R hemi, R inattention, psuher, global aphasia Restrictions Weight Bearing Restrictions: No General:   Vital Signs:  Pain: no signs of pain, pt denies   See Function Navigator for Current Functional Status.   Therapy/Group: Individual Therapy  Kennieth Rad, PT, DPT   Dwaine Deter, Landry Mellow M 02/19/2018, 1:33 PM

## 2018-02-19 NOTE — Progress Notes (Signed)
Paged Dr. Leanne Chang R/T O2 sats 88%  Orders to apply O2.Marland Kitchen Spoke with wife R/T patient's night,  with temp trending up, O2 sats trending down, applying 02 and BP. Appreciated update. "He's a Nurse, adult."Adam Yang A

## 2018-02-19 NOTE — Progress Notes (Signed)
Pt is up to chair this am and his lungs sound better after nasotracheal suctioning performed by RT. Pt's oxygen sats have improved since this morning. Pt resting comfortably in chair. Will continue to monitor.

## 2018-02-20 ENCOUNTER — Inpatient Hospital Stay (HOSPITAL_COMMUNITY): Payer: Medicare Other | Admitting: Physical Therapy

## 2018-02-20 ENCOUNTER — Inpatient Hospital Stay (HOSPITAL_COMMUNITY): Payer: Medicare Other

## 2018-02-20 ENCOUNTER — Inpatient Hospital Stay (HOSPITAL_COMMUNITY): Payer: Medicare Other | Admitting: Occupational Therapy

## 2018-02-20 LAB — BASIC METABOLIC PANEL
Anion gap: 11 (ref 5–15)
BUN: 48 mg/dL — AB (ref 8–23)
CO2: 26 mmol/L (ref 22–32)
Calcium: 9 mg/dL (ref 8.9–10.3)
Chloride: 98 mmol/L (ref 98–111)
Creatinine, Ser: 1.77 mg/dL — ABNORMAL HIGH (ref 0.61–1.24)
GFR calc Af Amer: 43 mL/min — ABNORMAL LOW (ref >60)
GFR, EST NON AFRICAN AMERICAN: 37 mL/min — AB (ref >60)
Glucose, Bld: 148 mg/dL — ABNORMAL HIGH (ref 70–99)
Potassium: 4.5 mmol/L (ref 3.5–5.1)
SODIUM: 135 mmol/L (ref 135–145)

## 2018-02-20 LAB — CBC
HCT: 29.7 % — ABNORMAL LOW (ref 39.0–52.0)
Hemoglobin: 9.5 g/dL — ABNORMAL LOW (ref 13.0–17.0)
MCH: 25.8 pg — ABNORMAL LOW (ref 26.0–34.0)
MCHC: 32 g/dL (ref 30.0–36.0)
MCV: 80.7 fL (ref 78.0–100.0)
PLATELETS: 301 10*3/uL (ref 150–400)
RBC: 3.68 MIL/uL — ABNORMAL LOW (ref 4.22–5.81)
RDW: 14.9 % (ref 11.5–15.5)
WBC: 7.6 10*3/uL (ref 4.0–10.5)

## 2018-02-20 NOTE — Progress Notes (Signed)
Speech Language Pathology Daily Session Note  Patient Details  Name: Adam Yang MRN: [id number] Date of Birth: 03-11-1946  Today's Date: 02/20/2018 SLP Individual Time: 1346-1405 SLP Individual Time Calculation (min): 19 min  Short Term Goals: Week 3: SLP Short Term Goal 1 (Week 3): With Total A, pt will initiate appropriate action with an ADL object (i.e. washcloth etc) to demonstrate function of object in 1 of 5 opportunities.  SLP Short Term Goal 2 (Week 3): With Total A cues, pt will demonstrate joint atteniton to high interest picture/object for ~ 5 seconds in 1 of 5 opportunities.  SLP Short Term Goal 3 (Week 3): With Total A, pt will produce voicing and attempt to sing common song (Amazing Shirlee Limerick, Happy Birthday) in 1 of 5 opportunities.  SLP Short Term Goal 4 (Week 3): With Total A, pt will participate in prefunctional dysphagia activities (oral stim, strengthening exercises) in 1 of 5 opportunities.   Skilled Therapeutic Interventions:Skilled ST services focused on cognitive and swallow skills. Pt was asleep upon entering room, however easily aroused. Pt attempted verbalizing, however vocalizations were unintelligible. SLP facilitated initiation of action given function objects ( wash face with washcloth , placing glasses on face) with total-max A cues for 2/5 trials. Pt demonstrate joint attention in the beginning of session for 10 seconds, however quickly faded with willibness to participate in therapy activities. Pt allow SLP to facilitated oral care given max a verbal and tactile cues, however refused to open oral cavity for extended time for thorough clean. SLP attempted facilitation of intelligible vocalizing utilizing familiar songs sang in unison, pt hummed to tune with with 10% accuracy. Treatment session was ended early due to lack of participation.. Pt was left in room with call bell within reach and bed alarm set. Recommend to continue skilled ST services.       Function:  Eating Eating     Eating Assist Level: Helper performs IV, parenteral or tube feed           Cognition Comprehension Comprehension assist level: Understands basic less than 25% of the time/ requires cueing >75% of the time  Expression   Expression assist level: Expresses basis less than 25% of the time/requires cueing >75% of the time.  Social Interaction Social Interaction assist level: Interacts appropriately less than 25% of the time. May be withdrawn or combative.  Problem Solving Problem solving assist level: Solves basic less than 25% of the time - needs direction nearly all the time or does not effectively solve problems and may need a restraint for safety;Solves basic 25 - 49% of the time - needs direction more than half the time to initiate, plan or complete simple activities  Memory Memory assist level: Recognizes or recalls less than 25% of the time/requires cueing greater than 75% of the time    Pain Pain Assessment Pain Scale: 0-10 Pain Score: 0-No pain Faces Pain Scale: Hurts a little bit  Therapy/Group: Individual Therapy  Keyonia Gluth  [facility] 02/20/2018, 2:07 PM

## 2018-02-20 NOTE — Progress Notes (Signed)
More alert. Incontinent of urine. Decreased congestion and rattling. O2 at 2L/M. Cornell Barman, RN

## 2018-02-20 NOTE — Progress Notes (Addendum)
Subjective/Complaints:  Pt remains severely aphasic Events of weekend noted RLL HCAP  ROS: limited due to language/communication   Objective: Vital Signs: Blood pressure 127/73, pulse 77, temperature 97.8 F (36.6 C), temperature source Oral, resp. rate 17, height '5\' 9"'$  (1.753 m), weight 60.3 kg, SpO2 92 %. No results found. Results for orders placed or performed during the hospital encounter of 02/02/18 (from the past 72 hour(s))  Basic metabolic panel     Status: Abnormal   Collection Time: 02/18/18  9:22 AM  Result Value Ref Range   Sodium 136 135 - 145 mmol/L   Potassium 4.1 3.5 - 5.1 mmol/L   Chloride 96 (L) 98 - 111 mmol/L   CO2 29 22 - 32 mmol/L   Glucose, Bld 124 (H) 70 - 99 mg/dL   BUN 45 (H) 8 - 23 mg/dL   Creatinine, Ser 1.91 (H) 0.61 - 1.24 mg/dL   Calcium 9.2 8.9 - 10.3 mg/dL   GFR calc non Af Amer 34 (L) >60 mL/min   GFR calc Af Amer 39 (L) >60 mL/min    Comment: (NOTE) The eGFR has been calculated using the CKD EPI equation. This calculation has not been validated in all clinical situations. eGFR's persistently <60 mL/min signify possible Chronic Kidney Disease.    Anion gap 11 5 - 15    Comment: Performed at [facility] [address], [place], [place] [address]  Hepatic function panel     Status: Abnormal   Collection Time: 02/18/18  9:22 AM  Result Value Ref Range   Total Protein 7.3 6.5 - 8.1 g/dL   Albumin 2.4 (L) 3.5 - 5.0 g/dL   AST 24 15 - 41 U/L   ALT 21 0 - 44 U/L   Alkaline Phosphatase 51 38 - 126 U/L   Total Bilirubin 0.5 0.3 - 1.2 mg/dL   Bilirubin, Direct 0.2 0.0 - 0.2 mg/dL   Indirect Bilirubin 0.3 0.3 - 0.9 mg/dL    Comment: Performed at [facility] [address] [place], [place] [address]  CBC     Status: Abnormal   Collection Time: 02/18/18  9:22 AM  Result Value Ref Range   WBC 10.1 4.0 - 10.5 K/uL   RBC 4.22 4.22 - 5.81 MIL/uL   Hemoglobin 10.9 (L) 13.0 - 17.0 g/dL   HCT 33.7 (L) 39.0 - 52.0 %   MCV 79.9 78.0  - 100.0 fL   MCH 25.8 (L) 26.0 - 34.0 pg   MCHC 32.3 30.0 - 36.0 g/dL   RDW 14.6 11.5 - 15.5 %   Platelets 331 150 - 400 K/uL    Comment: Performed at [facility], [place] [address] [place], [place] [address]  Basic metabolic panel     Status: Abnormal   Collection Time: 02/20/18  6:00 AM  Result Value Ref Range   Sodium 135 135 - 145 mmol/L   Potassium 4.5 3.5 - 5.1 mmol/L   Chloride 98 98 - 111 mmol/L   CO2 26 22 - 32 mmol/L   Glucose, Bld 148 (H) 70 - 99 mg/dL   BUN 48 (H) 8 - 23 mg/dL   Creatinine, Ser 1.77 (H) 0.61 - 1.24 mg/dL   Calcium 9.0 8.9 - 10.3 mg/dL   GFR calc non Af Amer 37 (L) >60 mL/min   GFR calc Af Amer 43 (L) >60 mL/min    Comment: (NOTE) The eGFR has been calculated using the CKD EPI equation. This calculation has not  been validated in all clinical situations. eGFR's persistently <60 mL/min signify possible Chronic Kidney Disease.    Anion gap 11 5 - 15    Comment: Performed at [facility] [address] [place], [place] [address]  CBC     Status: Abnormal   Collection Time: 02/20/18  6:00 AM  Result Value Ref Range   WBC 7.6 4.0 - 10.5 K/uL   RBC 3.68 (L) 4.22 - 5.81 MIL/uL   Hemoglobin 9.5 (L) 13.0 - 17.0 g/dL   HCT 29.7 (L) 39.0 - 52.0 %   MCV 80.7 78.0 - 100.0 fL   MCH 25.8 (L) 26.0 - 34.0 pg   MCHC 32.0 30.0 - 36.0 g/dL   RDW 14.9 11.5 - 15.5 %   Platelets 301 150 - 400 K/uL    Comment: Performed at [facility], [place] [address] [place], [place] [address]    Constitutional: No distress . Vital signs reviewed. HEENT: EOMI, oral membranes moist Neck: supple Cardiovascular: RRR without murmur. No JVD    Respiratory: diminished  Bilaterally without wheezes or rales. Normal effort    GI: BS +, non-tender, non-distended  Skin:   Intact. Warm and dry. Neuro: Globally aphasia, fairly alert.  tends to right side somewhat. Motor:   0/5 Right side Musc/Skel:  No edema or tenderness in extremities Psych: flat  Assessment/Plan: 1.  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 Physiatrist is providing close team supervision and 24 hour management of active medical problems listed below. Physiatrist and rehab team continue to assess barriers to discharge/monitor patient progress toward functional and medical goals. FIM: Function - Bathing Position: Wheelchair/chair at sink Body parts bathed by patient: Chest, Right arm Body parts bathed by helper: Left arm, Front perineal area, Buttocks, Right upper leg, Left upper leg, Right lower leg, Left lower leg, Back, Abdomen Assist Level: 2 helpers  Function- Upper Body Dressing/Undressing What is the patient wearing?: Pull over shirt/dress Pull over shirt/dress - Perfomed by patient: Thread/unthread right sleeve Pull over shirt/dress - Perfomed by helper: Thread/unthread left sleeve, Put head through opening, Pull shirt over trunk Button up shirt - Perfomed by helper: Thread/unthread left sleeve, Pull shirt around back Assist Level: 2 helpers Function - Lower Body Dressing/Undressing What is the patient wearing?: Pants, Socks, Shoes Position: Bed Pants- Performed by helper: Thread/unthread left pants leg, Pull pants up/down, Thread/unthread right pants leg Non-skid slipper socks- Performed by helper: Don/doff right sock, Don/doff left sock Socks - Performed by helper: Don/doff right sock, Don/doff left sock Shoes - Performed by helper: Don/doff right shoe, Don/doff left shoe, Fasten right, Fasten left Assist for footwear: Dependant Assist for lower body dressing: 2 Helpers  Function - Toileting Toileting activity did not occur: No continent bowel/bladder event Assist level: Two helpers  Function - Air cabin crew transfer activity did not occur: Safety/medical  concerns  Function - Chair/bed transfer Chair/bed transfer method: Other Chair/bed transfer assist level: 2 helpers Chair/bed transfer assistive device: Mechanical lift Mechanical lift: Maximove Chair/bed transfer details: Manual facilitation for placement, Manual facilitation for weight shifting, Verbal cues for safe use of DME/AE, Verbal cues for precautions/safety, Verbal cues for technique, Verbal cues for sequencing, Tactile cues for posture  Function - Locomotion: Wheelchair Will patient use wheelchair at discharge?: Yes Wheelchair activity did not occur: Safety/medical concerns Assist Level: Dependent (Pt equals  0%) Wheel 50 feet with 2 turns activity did not occur: Safety/medical concerns Wheel 150 feet activity did not occur: Safety/medical concerns Turns around,maneuvers to table,bed, and toilet,negotiates 3% grade,maneuvers on rugs and over doorsills: No Function - Locomotion: Ambulation Ambulation activity did not occur: Safety/medical concerns Assistive device: (3 muskateers + w/c follow) Max distance: 3 ft Assist level: 2 helpers Walk 10 feet activity did not occur: Safety/medical concerns Walk 50 feet with 2 turns activity did not occur: Safety/medical concerns Walk 150 feet activity did not occur: Safety/medical concerns Walk 10 feet on uneven surfaces activity did not occur: Safety/medical concerns  Function - Comprehension Comprehension: Auditory Comprehension assist level: Understands basic less than 25% of the time/ requires cueing >75% of the time  Function - Expression Expression: Nonverbal Expression assistive device: Other (Comment) Expression assist level: Expresses basis less than 25% of the time/requires cueing >75% of the time.  Function - Social Interaction Social Interaction assist level: Interacts appropriately less than 25% of the time. May be withdrawn or combative.  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 DVT Prophylaxis/Anticoagulation: SCDs. Monitor for any signs of DVT 3. Pain Management:Zanaflex 4 mg nightly--changed to as needed due to morning lethargy 4. Mood:Provide emotional support  -Likely some reactive depression but difficult to assess given his cognitive linguistic deficits---> consider antidepressant (discussed with wife today, no decision re: starting medication made today) 5. Neuropsych: This patientis notcapable of making decisions on hisown behalf. 6. Skin/Wound Care:Routine skin checks 7. Fluids/Electrolytes/Nutrition:Routine in and outs 8.Dysphagia: PEG placed 7/28 by INR  -follow for healing 9.Hypertension.   Lisinopril 20 mg daily, Norvasc 10 mg daily. Monitor with increased mobility   Vitals:   02/20/18 0500 02/20/18 0811  BP: 127/73   Pulse: 77   Resp: 17   Temp: 97.8 F (36.6 C)   SpO2: 92% 92%  BP controlled 8/12,amlodipine '10mg'$  daily,  Metoprolol '25mg'$  BID   10.Diabetes mellitus. Hemoglobin A1c 6.0.   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 12.Hyperlipidemia. Fenofibrate. 13. Hypernatremia- resolved Na+ 136 on 8/5  Cont free H20 flush 14. AKI on CKD  BUN/Creatinine still elevated 45/1.7, but  looking back over the last several weeks this has improved    -off IVF since 7/31   -H20 flushes currently 200cc q3 15. Anemia of chronic disease  Hemoglobin 12.4 on 7/30, 11.3- stool neg 8/5  Cont to monitor   .   LOS (Days) 18 A FACE TO FACE EVALUATION WAS PERFORMED  Charlett Blake 02/20/2018, 9:18 AM

## 2018-02-20 NOTE — NC FL2 (Signed)
[place] MEDICAID FL2 LEVEL OF CARE SCREENING TOOL     IDENTIFICATION  Patient Name: Adam Yang Birthdate: [birth year] Sex: male Admission Date (Current Location): 02/02/2018  [facility] and [place] Number:  Herbalist and Address:  [facility], [place] [address] [place], [place] [address]      Provider Number: [id number]  Attending Physician Name and Address:  Charlett Blake, MD  Relative Name and Phone Number:  Cathy-wife [phone]    Current Level of Care: Other (Comment)(rehab) Recommended Level of Care: [place] Prior Approval Number:    Date Approved/Denied:   PASRR Number:    Discharge Plan: SNF    Current Diagnoses: Patient Active Problem List   Diagnosis Date Noted  . Dysphasia   . Anemia of chronic disease   . Hypernatremia   . Transaminitis   . Hypertensive emergency 02/02/2018  . Intraparenchymal hemorrhage of brain ([place]) 02/02/2018  . Hemiparesis affecting right side as late effect of stroke ([place])   . Aphasia, post-stroke   . Dysphagia, post-stroke   . Nontraumatic subcortical hemorrhage of left cerebral hemisphere ([place])   . Diabetes mellitus type 2 in nonobese (HCC)   . AKI (acute kidney injury) ([place])   . Hyponatremia   . Chronic kidney disease   . Cytotoxic brain edema ([place]) 01/28/2018  . Brain herniation ([place]) 01/28/2018  . IVH (intraventricular hemorrhage) ([place]) 01/27/2018  . Right inguinal hernia 08/23/2017  . Chronic kidney disease, stage III (moderate) ([place]) 02/18/2017  . 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 Other fatigue 06/18/2016  . Essential hypertension 10/17/2015  . Mucositis due to chemotherapy 01/24/2015  . Hyperglycemia due to type 2 diabetes mellitus ([place]) 01/07/2015  . Chronic anxiety 12/19/2014  . Stage 2 chronic renal impairment associated with type 2 diabetes mellitus ([place]) 12/19/2014  . Type 2 diabetes  mellitus, controlled, with renal complications ([place]) 31/51/7616  . History of nasopharyngeal cancer 11/26/2014    Orientation RESPIRATION BLADDER Height & Weight     Situation, Place  O2(2 liters continous) Incontinent Weight: 132 lb 15 oz (60.3 kg) Height:  5\' 9"  (175.3 cm)  BEHAVIORAL SYMPTOMS/MOOD NEUROLOGICAL BOWEL NUTRITION STATUS      Incontinent Feeding tube  AMBULATORY STATUS COMMUNICATION OF NEEDS Skin   Extensive Assist Verbally Normal                       Personal Care Assistance Level of Assistance  Bathing, Feeding, Dressing Bathing Assistance: Limited assistance Feeding assistance: Maximum assistance Dressing Assistance: Limited assistance     Functional Limitations Info  Sight, Speech Sight Info: Impaired   Speech Info: Impaired    SPECIAL CARE FACTORS FREQUENCY  PT (By licensed PT), OT (By licensed OT), Bowel and bladder program, Speech therapy     PT Frequency: 5x week OT Frequency: 5x week Bowel and Bladder Program Frequency: timed tolieting every 2-3 hours   Speech Therapy Frequency: 5x week      Contractures Contractures Info: Not present    Additional Factors Info  Code Status Code Status Info: DNR             Current Medications (02/20/2018):  This is the current hospital active medication list Current Facility-Administered Medications  Medication Dose Route Frequency Provider Last Rate Last Dose  . acetaminophen (TYLENOL) solution 650 mg  650 mg Per Tube Q4H PRN Charlett Blake, MD   650 mg at 02/20/18 0221  .  albuterol (PROVENTIL) (2.5 MG/3ML) 0.083% nebulizer solution 2.5 mg  2.5 mg Nebulization TID Charlett Blake, MD   2.5 mg at 02/20/18 1302  . amLODipine (NORVASC) tablet 10 mg  10 mg Oral Daily Cathlyn Parsons, PA-C   10 mg at 02/20/18 0857  . artificial tears (LACRILUBE) ophthalmic ointment   Both Eyes Q3H PRN Angiulli, Lavon Paganini, PA-C      . bisacodyl (DULCOLAX) suppository 10 mg  10 mg Rectal Daily PRN Jamse Arn, MD   10 mg at 02/08/18 1447  . ceFEPIme (MAXIPIME) 2 g in sodium chloride 0.9 % 100 mL IVPB  2 g Intravenous Q24H Lisabeth Pick, MD 200 mL/hr at 02/20/18 1251 2 g at 02/20/18 1251  . chlorhexidine (PERIDEX) 0.12 % solution 15 mL  15 mL Mouth Rinse BID Jamse Arn, MD   15 mL at 02/20/18 0856  . diclofenac sodium (VOLTAREN) 1 % transdermal gel 2 g  2 g Topical QID Charlett Blake, MD   2 g at 02/20/18 1250  . feeding supplement (GLUCERNA 1.2 CAL) liquid 1,000 mL  1,000 mL Per Tube Continuous Charlett Blake, MD 75 mL/hr at 02/20/18 0614 1,000 mL at 02/20/18 0614  . feeding supplement (PRO-STAT SUGAR FREE 64) liquid 30 mL  30 mL Per Tube Daily Kirsteins, Luanna Salk, MD   30 mL at 02/20/18 0857  . free water 200 mL  200 mL Per Tube Q3H Jamse Arn, MD   200 mL at 02/20/18 1300  . hydrocortisone cream 1 %   Topical TID Charlett Blake, MD      . MEDLINE mouth rinse  15 mL Mouth Rinse q12n4p Jamse Arn, MD   15 mL at 02/20/18 1300  . methylphenidate (RITALIN) tablet 5 mg  5 mg Per Tube BID Meredith Staggers, MD   5 mg at 02/19/18 1247  . metoprolol tartrate (LOPRESSOR) tablet 25 mg  25 mg Oral BID Charlett Blake, MD   25 mg at 02/20/18 0857  . multivitamin liquid 15 mL  15 mL Per Tube Daily Cathlyn Parsons, PA-C   15 mL at 02/20/18 0857  . polyethylene glycol (MIRALAX / GLYCOLAX) packet 17 g  17 g Per Tube Daily PRN Kirsteins, Luanna Salk, MD   17 g at 02/16/18 9233     Discharge Medications: Please see discharge summary for a list of discharge medications.  Relevant Imaging Results:  Relevant Lab Results:   Additional Information SSN: [id number]  Kennley Schwandt, Gardiner Rhyme, LCSW

## 2018-02-20 NOTE — Progress Notes (Signed)
Physical Therapy Session Note  Patient Details  Name: Adam Yang MRN: [id number] Date of Birth: 1946/02/03  Today's Date: 02/20/2018 PT Individual Time: 1100-1200 PT Individual Time Calculation (min): 60 min   Short Term Goals: Week 3:  PT Short Term Goal 1 (Week 3): STG = LTG due to anticipated SNF placement.  Skilled Therapeutic Interventions/Progress Updates: Pt received seated in w/c at nurses station, no evidence of pain and agreeable to treatment. Lateral scoot transfer w/<> mat table with transfer board max/totalA +2. Focus on sitting balance and trunk alignment on edge of mat with repetitive L lateral leaning onto L elbow, L lateral reaching for target, straddled on low bench to facilitate anterior pelvic rotation. Pt demo's slight improvement of midline unsupported sitting posture after above interventions, however fatigues easily with questionable behavioral component and reclines back against therapist several times for rest breaks. Sit >stand at [employer], improved pt initiation when LUE reached further forward to facilitate anterior weight shift. Gait maxA x25' with w/c follow; maxA for RLE progression and stance control, however initiates LLE step once RLE supported, reduced pushing with repetition and pt initiates moderate weight shifts R/L during gait. Returned to room, maxA +2 squat pivot following incontinent bowel movement. Rolling R/L maxA to change brief dependently. Remained in bed, heels floated, RUE supported on pillow and telesitter intact, all needs in reach.      Therapy Documentation Precautions:  Precautions Precautions: Fall Precaution Comments: R hemi, R inattention, psuher, global aphasia Restrictions Weight Bearing Restrictions: No Pain: Pain Assessment Faces Pain Scale: Hurts a little bit   See Function Navigator for Current Functional Status.   Therapy/Group: Individual Therapy  Corliss Skains 02/20/2018, 12:48 PM

## 2018-02-20 NOTE — Progress Notes (Signed)
Occupational Therapy Session Note  Patient Details  Name: Adam Yang MRN: [id number] Date of Birth: 04-Aug-1945  Today's Date: 02/20/2018 OT Individual Time: 1000-1040 OT Individual Time Calculation (min): 40 min    Short Term Goals: Week 2:  OT Short Term Goal 1 (Week 2): Pt will complete sit > stand with mod assist to decrease burden of care with LB bathing/dressing OT Short Term Goal 2 (Week 2): Pt will complete bathing with max assist with mod cues for sequencing OT Short Term Goal 3 (Week 2): Pt will complete UB dressing with max assist OT Short Term Goal 4 (Week 2): Pt will complete toilet transfer with max assist of 1 caregiver  Skilled Therapeutic Interventions/Progress Updates:    Treatment session with focus on initiation and following one step commands to participate in self-care tasks.  Pt received in tilt in space w/c asleep.  Pt opened eyes to name but maintained eyes closed for majority of session.  Engaged in grooming tasks in w/c with hand over hand to increase participation in washing face.  Pt required hand over hand for initiation, then pt demonstrating perseveration when asked to hand wash cloth to therapist.  Washed hair in sitting with focus on following one step commands to assist with trunk control/leaning forward to allow therapist to wash and comb back of hair.  Attempted to engage in reaching task with pt reaching to midline one time to obtain item from therapist but would not release item or return to midline to obtain another item.  Pt shaking head "no" when asked to either obtain additional item or place item in therapist's hand.  Pt left in tilt in space w/c at RN station for supervision.  Therapy Documentation Precautions:  Precautions Precautions: Fall Precaution Comments: R hemi, R inattention, psuher, global aphasia Restrictions Weight Bearing Restrictions: No General:   Vital Signs: Oxygen Therapy SpO2: 92 % O2 Device: Nasal Cannula O2 Flow Rate  (L/min): 2 L/min Pain: Pain Assessment Faces Pain Scale: Hurts a little bit  See Function Navigator for Current Functional Status.   Therapy/Group: Individual Therapy  Simonne Come 02/20/2018, 12:10 PM

## 2018-02-21 ENCOUNTER — Inpatient Hospital Stay (HOSPITAL_COMMUNITY): Payer: Medicare Other | Admitting: Physical Therapy

## 2018-02-21 ENCOUNTER — Inpatient Hospital Stay (HOSPITAL_COMMUNITY): Payer: Medicare Other | Admitting: Occupational Therapy

## 2018-02-21 ENCOUNTER — Inpatient Hospital Stay (HOSPITAL_COMMUNITY): Payer: Medicare Other

## 2018-02-21 DIAGNOSIS — J690 Pneumonitis due to inhalation of food and vomit: Secondary | ICD-10-CM | POA: Insufficient documentation

## 2018-02-21 NOTE — Progress Notes (Signed)
Speech Language Pathology Daily Session Note  Patient Details  Name: Adam Yang MRN: [id number] Date of Birth: 02-24-1946  Today's Date: 02/21/2018 SLP Individual Time: 2355-7322 SLP Individual Time Calculation (min): 28 min  Short Term Goals: Week 3: SLP Short Term Goal 1 (Week 3): With Total A, pt will initiate appropriate action with an ADL object (i.e. washcloth etc) to demonstrate function of object in 1 of 5 opportunities.  SLP Short Term Goal 2 (Week 3): With Total A cues, pt will demonstrate joint atteniton to high interest picture/object for ~ 5 seconds in 1 of 5 opportunities.  SLP Short Term Goal 3 (Week 3): With Total A, pt will produce voicing and attempt to sing common song (Amazing Shirlee [place], Happy Birthday) in 1 of 5 opportunities.  SLP Short Term Goal 4 (Week 3): With Total A, pt will participate in prefunctional dysphagia activities (oral stim, strengthening exercises) in 1 of 5 opportunities.   Skilled Therapeutic Interventions:Skilled ST services focused on swallow and cognitive skills. SLP facilitated joint attention and initiation of action with functional objects, pt required total A in 2/5 opportunities, and refused participation with further objects. Pt attempted vocalization, however remains unintelligible. SLP facilitated prefunctional dyspahgia activities, oral stimulation through oral care, removing large pieces of dried secretions from soft palate and tongue. SLP facilitated communication of wants/needs via yes/no response given max A verbal/visual cues (blanket on/off, music on/of, volume of music) to aid in comfort, however pt was in inconsistent, decision determined largely by facial expressions. Pt was left in room with call bell within reach and bed alaram set. SLP communicated with nurse about need to continue scheduled oral care, she is in agreement. ST reccomends to continue skilled ST services.     Function:  Eating Eating                  Cognition Comprehension Comprehension assist level: Understands basic less than 25% of the time/ requires cueing >75% of the time;Understands basic 25 - 49% of the time/ requires cueing 50 - 75% of the time  Expression   Expression assist level: Expresses basis less than 25% of the time/requires cueing >75% of the time.  Social Interaction Social Interaction assist level: Interacts appropriately less than 25% of the time. May be withdrawn or combative.;Interacts appropriately 25 - 49% of time - Needs frequent redirection.  Problem Solving Problem solving assist level: Solves basic less than 25% of the time - needs direction nearly all the time or does not effectively solve problems and may need a restraint for safety;Solves basic 25 - 49% of the time - needs direction more than half the time to initiate, plan or complete simple activities  Memory Memory assist level: Recognizes or recalls less than 25% of the time/requires cueing greater than 75% of the time    Pain Pain Assessment Pain Score: 0-No pain  Therapy/Group: Individual Therapy  Jakeria Caissie  [facility] 02/21/2018, 3:25 PM

## 2018-02-21 NOTE — Progress Notes (Signed)
Administered patients nebulizer treatment.. Family requested that mouth care be done performed mouth care on patient and deep suctioned patients mouth and cleaned out tubing patient generating a deep cough just unable to get secretions to surface.

## 2018-02-21 NOTE — Progress Notes (Addendum)
Physical Therapy Session Note  Patient Details  Name: Adam Yang MRN: [id number] Date of Birth: [birth year]  Today's Date: 02/21/2018 PT Individual Time: 8144-8185 PT Individual Time Calculation (min): 24 min   Short Term Goals: Week 3:  PT Short Term Goal 1 (Week 3): STG = LTG due to anticipated SNF placement.  Skilled Therapeutic Interventions/Progress Updates:  Pt received in TIS w/c at nurses station. No behaviors demonstrating pain. Transported pt around unit via w/c total assist. Pt transferred sit>stand at parallel bars with MAX cuing to initiate participation/movement in task. Pt transfers sit>stand with +2 assist and requires total assist for standing balance (tolerated standing <15 seconds x 2 trials) as pt pushing R and unable to correct; pt able to weight bear through RLE but not fully extend R knee. Pt transferred back to bed with slide board +2 total assist but pt able to initiate pushing on armrest with LUE, but still requires significant assistance and is unable to demonstrate anterior weight shifting even with total assist/cuing. Pt transfers sit>supine and scoots to [facility] with +2 total and dependent assist respectfully. Pt left in bed with alarm set & all needs within reach, 4 rails up per safety plan.  Pt on supplemental oxygen via nasal cannula during session. SpO2 = 91%, HR = 74 bpm on 2L/min at rest. SpO2 dropped to 87% with standing and oxygen increased to 3L/min & SpO2 rose to 92%. At end of session pt left in bed on 3L/min and SpO2 = 89-90%  Therapy Documentation Precautions:  Precautions Precautions: Fall Precaution Comments: R hemi, R inattention, psuher, global aphasia Restrictions Weight Bearing Restrictions: No   See Function Navigator for Current Functional Status.   Therapy/Group: Individual Therapy  Waunita Schooner 02/21/2018, 12:10 PM

## 2018-02-21 NOTE — Progress Notes (Signed)
Subjective/Complaints: Met with patient's wife initially at bedside and then outside the room today.  She had multiple questions regarding pneumonia, blood pressure, renal function, white blood cell count prognosis as well as next steps in terms of his care.  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 rate 20, height _0  (1.753 m), weight 60.3 kg, SpO2 92 %. No results found. Results for orders placed or performed during the hospital encounter of 02/02/18 (from the past 72 hour(s))  Basic metabolic panel     Status: Abnormal   Collection Time: 02/20/18  6:00 AM  Result Value Ref Range   Sodium 135 135 - 145 mmol/L   Potassium 4.5 3.5 - 5.1 mmol/L   Chloride 98 98 - 111 mmol/L   CO2 26 22 - 32 mmol/L   Glucose, Bld 148 (H) 70 - 99 mg/dL   BUN 48 (H) 8 - 23 mg/dL   Creatinine, Ser 1.77 (H) 0.61 - 1.24 mg/dL   Calcium 9.0 8.9 - 10.3 mg/dL   GFR calc non Af Amer 37 (L) >60 mL/min   GFR calc Af Amer 43 (L) >60 mL/min    Comment: (NOTE) The eGFR has been calculated using the CKD EPI equation. This calculation has not been validated in all clinical situations. eGFR's persistently <60 mL/min signify possible Chronic Kidney Disease.    Anion gap 11 5 - 15    Comment: Performed at [facility] [address], [place], [place] [address]  CBC     Status: Abnormal   Collection Time: 02/20/18  6:00 AM  Result Value Ref Range   WBC 7.6 4.0 - 10.5 K/uL   RBC 3.68 (L) 4.22 - 5.81 MIL/uL   Hemoglobin 9.5 (L) 13.0 - 17.0 g/dL   HCT 29.7 (L) 39.0 - 52.0 %   MCV 80.7 78.0 - 100.0 fL   MCH 25.8 (L) 26.0 - 34.0 pg   MCHC 32.0 30.0 - 36.0 g/dL   RDW 14.9 11.5 - 15.5 %   Platelets 301 150 - 400 K/uL    Comment: Performed  at [facility], [place] [address] [place], [place] [address]    Constitutional: No distress . Vital signs reviewed. HEENT: EOMI, oral membranes moist Neck: supple Cardiovascular: RRR without murmur. No JVD    Respiratory: diminished  Bilaterally without wheezes or rales. Normal effort    GI: BS +, non-tender, non-distended  Skin:   Intact. Warm and dry. Neuro: Globally aphasia, fairly alert.  tends to right side somewhat. Motor:   0/5 Right side Musc/Skel:  No edema or tenderness in extremities Psych: flat  Assessment/Plan: 1. 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 Physiatrist is providing close team supervision and 24 hour management of active medical problems listed below. Physiatrist and rehab team continue to assess barriers to discharge/monitor patient progress toward functional and medical goals. FIM: Function - Bathing Position: Wheelchair/chair at sink Body parts bathed by patient: Chest, Right arm Body parts bathed by helper: Left arm, Front perineal area, Buttocks, Right upper leg, Left upper leg, Right lower leg, Left lower leg, Back, Abdomen  Assist Level: 2 helpers  Function- Upper Body Dressing/Undressing What is the patient wearing?: Pull over shirt/dress Pull over shirt/dress - Perfomed by patient: Thread/unthread right sleeve Pull over shirt/dress - Perfomed by helper: Thread/unthread left sleeve, Put head through opening, Pull shirt over trunk Button up shirt - Perfomed by helper: Thread/unthread left sleeve, Pull shirt around back Assist Level: 2 helpers Function - Lower Body Dressing/Undressing What is the patient wearing?: Pants, Socks, Shoes Position: Bed Pants- Performed by helper: Thread/unthread left pants leg, Pull pants up/down, Thread/unthread right  pants leg Non-skid slipper socks- Performed by helper: Don/doff right sock, Don/doff left sock Socks - Performed by helper: Don/doff right sock, Don/doff left sock Shoes - Performed by helper: Don/doff right shoe, Don/doff left shoe, Fasten right, Fasten left Assist for footwear: Dependant Assist for lower body dressing: 2 Helpers  Function - Toileting Toileting activity did not occur: No continent bowel/bladder event Assist level: Two helpers  Function - Air cabin crew transfer activity did not occur: Safety/medical concerns  Function - Chair/bed transfer Chair/bed transfer method: Lateral scoot Chair/bed transfer assist level: 2 helpers Chair/bed transfer assistive device: Sliding board, Armrests Mechanical lift: Maximove Chair/bed transfer details: Manual facilitation for placement, Manual facilitation for weight shifting, Verbal cues for safe use of DME/AE, Verbal cues for precautions/safety, Verbal cues for technique, Verbal cues for sequencing, Tactile cues for posture, Tactile cues for sequencing  Function - Locomotion: Wheelchair Will patient use wheelchair at discharge?: Yes Wheelchair activity did not occur: Safety/medical concerns Assist Level: Dependent (Pt equals 0%) Wheel 50 feet with 2 turns activity did not occur: Safety/medical concerns Wheel 150 feet activity did not occur: Safety/medical concerns Turns around,maneuvers to table,bed, and toilet,negotiates 3% grade,maneuvers on rugs and over doorsills: No Function - Locomotion: Ambulation Ambulation activity did not occur: Safety/medical concerns Assistive device: (3 muskateers + w/c follow) Max distance: 25 Assist level: 2 helpers Walk 10 feet activity did not occur: Safety/medical concerns Assist level: 2 helpers Walk 50 feet with 2 turns activity did not occur: Safety/medical concerns Walk 150 feet activity did not occur: Safety/medical concerns Walk 10 feet on uneven surfaces activity did not occur:  Safety/medical concerns  Function - Comprehension Comprehension: Auditory Comprehension assist level: Understands basic less than 25% of the time/ requires cueing >75% of the time  Function - Expression Expression: Nonverbal Expression assistive device: Other (Comment) Expression assist level: Expresses basis less than 25% of the time/requires cueing >75% of the time.  Function - Social Interaction Social Interaction assist level: Interacts appropriately less than 25% of the time. May be withdrawn or combative.  Function - Problem Solving Problem solving assist level: Solves basic less than 25% of the time - needs direction nearly all the time or does not effectively solve problems and may need a restraint for safety, Solves basic 25 - 49% of the time - needs direction more than half the time to initiate, plan or complete simple activities  Function - Memory Memory assist level: Recognizes or recalls less than 25% of the time/requires cueing greater than 75% of the time Patient normally able to recall (first 3 days only): None of the above  Medical Problem List and Plan: 1.Right side weakness with facial droop and dysphasiasecondary to large left frontoparietal hemorrhagic infarction with vasogenic edema and mass-effect  Continue CIR minimal progression, would rec SNF given 2+ assist level  -therapy decreased to 15/7 due to poor therapy tolerance  -Improved arousal with Ritalin   2. DVT Prophylaxis/Anticoagulation: SCDs. Monitor for any signs  of DVT 3. Pain Management:Zanaflex 4 mg nightly--changed to as needed due to morning lethargy 4. Mood:Provide emotional support  -Likely some reactive depression but difficult to assess given his cognitive linguistic deficits---> consider antidepressant (discussed with wife today, no decision re: starting medication made today) 5. Neuropsych: This patientis notcapable of making decisions on hisown behalf. 6. Skin/Wound Care:Routine skin  checks 7. Fluids/Electrolytes/Nutrition:Routine in and outs 8.Dysphagia: PEG placed 7/28 by INR  -Wife is interested in a button type of gastrostomy tube this may be able to be switched out as an outpatient. We discussed the poor prognosis for further's feeding recovery and she is aware of this 9.Hypertension.   Lisinopril 20 mg daily, Norvasc 10 mg daily. Monitor with increased mobility   Vitals:   02/21/18 0806 02/21/18 0830  BP:    Pulse:    Resp:    Temp:  99.9 F (37.7 C)  SpO2: 92%   BP controlled 8/12,amlodipine 38m daily,  Metoprolol 285mBID   10.Diabetes mellitus. Hemoglobin A1c 6.0.   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 12.Hyperlipidemia. Fenofibrate. 13. Hypernatremia- resolved Na+ 136 on 8/5  Cont free H20 flush 14. AKI on CKD  BUN/Creatinine still elevated 45/1.7, but looking back over the last several weeks this has improved, informed wife  On 08/19/2017 his BUN was 46 and creatinine was 2.38  -off IVF since 7/31   -H20 flushes currently 200cc q3 15. Anemia of chronic disease  Hemoglobin 12.4 on 7/30, 9.5 on 02/20/2018- stool neg x2 on 8/5  Cont to monitor  Greater than 35 minutes was spent with patient and wife.  Counseling coordination of care accounted program 50% of time .   LOS (Days) [id number]  AnCharlett Blake/13/2019, 12:33 PM

## 2018-02-21 NOTE — Progress Notes (Signed)
Occupational Therapy Weekly Progress Note  Patient Details  Name: Adam Yang MRN: [id number] Date of Birth: [birth year]  Beginning of progress report period: February 10, 2018 End of progress report period: February 21, 2018  Today's Date: 02/21/2018 OT Individual Time: 1003-1050 OT Individual Time Calculation (min): 47 min    Patient has met 0 of 4 short term goals.  Pt is making very minimal progress towards goals as he is limited by lethargy, fatigue, and decreased activity tolerance.  Pt continues to require max-total assist +2 for bed mobility and self-care tasks.  Pt demonstrates fluctuating levels of attention and initiation, impacting participation in self-care tasks.  Pt would benefit from continued skilled OT treatment to focus on family education and increased pt activity tolerance and participation in self-care tasks.  Patient continues to demonstrate the following deficits: muscle weakness,decreased cardiorespiratoy endurance,abnormal tone and unbalanced muscle activation,decreased visual perceptual skills,decreased midline orientation and right side neglect,decreased attention, decreased awareness, decreased problem solving and decreased memoryand decreased sitting balance, decreased standing balance, decreased postural control, hemiplegia and decreased balance strategies and therefore will continue to benefit from skilled OT intervention to enhance overall performance with BADL and Reduce care partner burden.  Patient not progressing toward long term goals.  See goal revision..  Plan of care revisions: downgraded goals to max-total assist.  OT Short Term Goals Week 2:  OT Short Term Goal 1 (Week 2): Pt will complete sit > stand with mod assist to decrease burden of care with LB bathing/dressing OT Short Term Goal 1 - Progress (Week 2): Progressing toward goal OT Short Term Goal 2 (Week 2): Pt will complete bathing with max assist with mod cues for sequencing OT Short Term Goal  2 - Progress (Week 2): Progressing toward goal OT Short Term Goal 3 (Week 2): Pt will complete UB dressing with max assist OT Short Term Goal 3 - Progress (Week 2): Not met OT Short Term Goal 4 (Week 2): Pt will complete toilet transfer with max assist of 1 caregiver OT Short Term Goal 4 - Progress (Week 2): Not met Week 3:  OT Short Term Goal 1 (Week 3): Pt will complete rolling at bed level with max assist to decrease burden of care to allow caregiver to complete bathing/LB dressing OT Short Term Goal 2 (Week 3): Pt will initiate a grooming task with max multimodal cues in 1 of 5 attempts OT Short Term Goal 3 (Week 3): Pt will demonstrate improved attention to task with focused attention to complete a grooming task in 1 of 5 attempts  Skilled Therapeutic Interventions/Progress Updates:    Treatment session with focus on arousal, initiation, and following one step commands. Pt received upright in tilt in space w/c with nursing staff having just gotten him out of bed. Pt's wife present at beginning of session, concerned about pt's PNA and expressing desire for increased activity for pt.  Completed stand pivot transfer +2 with focus on pt initiation of movement.  Pt demonstrating pushing to Rt in sitting, much decreased in standing however.  Engaged in weight bearing through Lt elbow to decrease pushing in sitting.  Sit > stand x3 with +2 for safety and assist to maintain RLE positioning and support at knee.  Pt demonstrating increased initiation with sit > stand by 3rd attempt.  Engaged in weight shifting at hips in standing with attempt to reach towards item on Lt.  Required hand over hand to facilitate reaching to further facilitate weight shift.  Pt continues to demonstrate  decreased yes/no accuracy and decreased initiation during seated, reaching activity this session.  Assessed vitals in standing with pt BP increasing with activity.  O2 sats dropped ot 87% on room air, applied 2L supplemental O2 via  nasal cannula with improvement to low 90s and mild increase in alertness with O2 applied.    Pt left upright in tilt in space w/c at RN station with 2L O2.  Therapy Documentation Precautions:  Precautions Precautions: Fall Precaution Comments: R hemi, R inattention, psuher, global aphasia Restrictions Weight Bearing Restrictions: No General:   Vital Signs: Therapy Vitals Temp: 98.3 F (36.8 C) Temp Source: Oral Pulse Rate: 89 Resp: 14 BP: 119/61 Patient Position (if appropriate): Lying Oxygen Therapy SpO2: 94 % O2 Device: Room Air O2 Flow Rate (L/min): 3 L/min Pain:  Pt with no c/o pain  See Function Navigator for Current Functional Status.   Therapy/Group: Individual Therapy  Simonne Come 02/21/2018, 3:18 PM

## 2018-02-22 ENCOUNTER — Inpatient Hospital Stay (HOSPITAL_COMMUNITY): Payer: Medicare Other

## 2018-02-22 ENCOUNTER — Inpatient Hospital Stay (HOSPITAL_COMMUNITY): Payer: Medicare Other | Admitting: Physical Therapy

## 2018-02-22 ENCOUNTER — Inpatient Hospital Stay (HOSPITAL_COMMUNITY): Payer: Medicare Other | Admitting: Occupational Therapy

## 2018-02-22 DIAGNOSIS — J690 Pneumonitis due to inhalation of food and vomit: Secondary | ICD-10-CM | POA: Diagnosis present

## 2018-02-22 LAB — OCCULT BLOOD X 1 CARD TO LAB, STOOL: FECAL OCCULT BLD: NEGATIVE

## 2018-02-22 NOTE — Progress Notes (Addendum)
Social Work Patient ID: Adam Yang, male   DOB: 1946-02-18, 72 y.o.   MRN: [id number] Daughter and wife where and have several concerns regarding pt being medically stable for transfer to a  NH. They would like him transferred to acute or to [facility], they feel he is using his rehab days and can not participate. Wife plans to contact the insurance and appeal if needed due to not feeling he is ready to be transferred to a NH. She is aware the insurance would also need to approve for him to be transferred to another hospital. Have asked MD to address their concerns. Have also contacted [employer] [employer] to inform of wife's concern. She voiced if MD feels he is medically stable and a plan is in place, could appeal.

## 2018-02-22 NOTE — Consult Note (Signed)
Medical Consultation   Adam Yang  UKG:[id number]  DOB: 05/25/1946  DOA: 02/02/2018  PCP: Lawerance Cruel, MD   Outpatient Specialists: Redmond Baseman - ENT; Alvy Bimler - oncology; Isidore Moos - radiation onc; [facility] - nephrology   Requesting physician: Linna Hoff for Dr. Alysia Penna  Reason for consultation: No need for transfer, just a medical consult.  Hemorrhagic stroke, PNA, h/o oropharnygeal CA s/p PEG.  Dr. Letta Pate feels comfortable, but the family would like for him to be evaluated from a medical standpoint to ensure that he is being fully cared for.   History of Present Illness: Adam Yang is an 72 y.o. male with past medical history of hypertension, diabetes, hyperlipidemia past radiation for nasopharynx cancer who presented on 7/18 for stroke-like symptoms, found to have left frontoparietal bleed.  He was unable to obtain an MRI when his HR dropped into the 30s repeatedly.  He had severe HTN and was admitted to the ICU.  He had resultant global aphasia, right hemiplegia, and right neglect.  He improved well enough to be appropriate for transfer to CIR.  On 7/25, he was found to be unresponsive with a BP of 76/48.  Patient had been hypertensive to 188/100 and was given 20 mg hydralazine with resultant BP 68/39 and unresponsive 30 minutes later.  He was given IVF bolus and had improved BP to 149/73 with increased mental status.  He was transferred to CIR later that day.  On 8/10, he was found to have bibasilar PNA, R>L.  He was started on Cefepime.  He has had minimal progression in CIR and so is recommended to go to SNF based on review of rehab notes.   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  He had some episodes where he would go outside and feel presyncopal.  His BUN/Creatinine levels were elevated, and he was sent to nephrology in April - but there was no good answer for what was wrong with the kidneys.  Has been on HTN  medications for about 20 years.  He had been off Norvasc but it was added back over the last year.  He was also taking fenofibrate, fish oil but really no other medications.  He was not on ASA and had no h/o CAD.  7/18, he had a CVA.  He had a significant hemorrhage, not on anticoagulation.  He was treated in the ICU here and treated with permissive HTN.  He was able to try to talk and communicate.  He was transferred out of ICU to the stroke unit.  They felt that he would be a good candidate for rehab.  He was interacting and recognizing people but unable to verbalize.  He did have a dense right hemiparesis.  He was transferred to rehab.  He seems to have been downhill since arriving in rehab.  He came over Thursday evening, 7/25.  The night before transfer, his BP dropped to 70/40 and yet he was still transferred; she is concerned that this might have been premature.  Friday, they introduced the therapy plan.  He fell out of bed on Friday. He was moving his legs and active, "typical stroke, he thought he could do it."  He got a telesitter at that time.  Since then, he has seemed weaker.  Last week, SW told them this unit is for intense rehab, may need to think about long-range plans.  His family was considering bringing him home.  He seemed to be really trying with PT/OT.  He had an NG tube and he was getting accuchecks q4h - he previously had a PEG tube during his remote cancer and his wife was concerned about going that route again.  She requested that he be changed to Glucerna and not continue SSI.  Radiology came over the weekend to obtain her consent to transition him to PEG tube. His wife was concered about this and she wanted to wait to proceed with the tube.  His wife requested that he not go for a PEG yet.  He went on Monday and got the PEG despite her concerns - although the wife did sign the consent on Saturday.  His wife is now concerned that he has a PEG; getting 3 hours of therapy services; pain due  to the PEG - so he was not meeting goals of therapy and that "all these setbacks" may be the reason.  Last week, Monday/Tuesday he was doing well with PT.  Wednesday, he was excessively fatigued and had difficulty participating in therapy.  BP 90/50.  His wife was then concerned about excessive medication for hypertension.  His wife was then concerned that he was started on new medications not discussed with her ("Ritalin, Baclofen, Vasopressin").  She had not spoken with Dr. Letta Pate and so was again frustrated.  His wife wanted to [name] all of his medications since he had not previously been on many medications at home.  Friday evening, his wife noticed that he felt warm; the weekend nurse was concerned and sent him for a CXR.  He was started on antibiotics for possible aspiration and was started on respiratory toilet.  Monday, his wife was concerned that she still had not met Dr. Letta Pate.  She felt that the antibiotics were working.  She wants to ensure that he has mouth care q2h due to h/o head/neck cancer.  SW last week asked for her top 3 choices for SNF last week; they were expecting to stay in rehab until 8/21.  Today, she was informed that [facility] will only pay through 8/16 and then will need to go to another facility.  She feels like he is wasting his rehab days while he has an acute illness.  His wife reached out to the ENT and Dr. Harrington Challenger; she thought maybe he needed to go to [facility] or to the acute side.  SW today told her that maybe he isn't stable for transport to a SNF at this time related to his PNA.  She just wants the best for him and she just hates that he's going through this.  She is "realistic in knowing that he hasn't made any gains with PT, but he has had every set back possible".     Review of Systems:  Unable to perform   Past Medical History: Past Medical History:  Diagnosis Date  . Anxiety    Claustrophobia  . Arthritis   . Cancer (HCC)    nasopharynx  . Chronic cough     EXCESS MUCUS BUILDUP SINCE ORAL SURGERY FROM CANCER   . Chronic kidney disease    Renal Insuffiency , Creatinine has been in the 2- 3 range, 11/27/14 Creatine 1.4  . Diabetes mellitus without complication ([place])    Type II  . Gout   . History of radiation therapy 12/23/2014- 02/10/2015   Nasopharynx / Neck Adenopathy  . Hyperlipidemia   . Hypertension   . Inguinal  hernia   . Right inguinal hernia 08/23/2017    Past Surgical History: Past Surgical History:  Procedure Laterality Date  . colonoscopy    . COLONOSCOPY W/ POLYPECTOMY    . INGUINAL HERNIA REPAIR Right 08/23/2017   Procedure: OPEN REPAIR RIGHT INGUINAL HERNIA WITH MESH;  Surgeon: Fanny Skates, MD;  Location: [facility];  Service: General;  Laterality: Right;  GENERAL AND TAP BLOCK  . INSERTION OF MESH Right 08/23/2017   Procedure: INSERTION OF MESH;  Surgeon: Fanny Skates, MD;  Location: [facility];  Service: General;  Laterality: Right;  GENERAL AND TAP BLOCK  . IR GASTROSTOMY TUBE MOD SED  02/06/2018  . KNEE SURGERY Left 1990's   tibia crushed, pinned and bone grafted  . LYMPH NODE BIOPSY    . MULTIPLE EXTRACTIONS WITH ALVEOLOPLASTY N/A 12/06/2014   Procedure: Extraction of tooth #'s 2,4,5,6,7,8,9,10,11,12,13,14,15, 20,22,26,27,28,29 with alveoloplasty.;  Surgeon: Lenn Cal, DDS;  Location: [facility];  Service: Oral Surgery;  Laterality: N/A;     Allergies:  No Known Allergies   Social History:  reports that he quit smoking about 19 years ago. He has a 25.00 pack-year smoking history. He has never used smokeless tobacco. He reports that he does not drink alcohol or use drugs.   Family History: Family History  Problem Relation Age of Onset  . Cancer Mother        throat ca  . Cancer Father        lung ca  . Cancer Brother        throat ca  . Heart block Brother       Physical Exam: Vitals:   02/22/18 0432 02/22/18 0534 02/22/18 0842 02/22/18 1546  BP: (!) 154/82     Pulse: 97 91    Resp: 19     Temp:  99.4 F (37.4 C)     TempSrc: Oral     SpO2: 93% 93% 93% 92%  Weight: 60.2 kg     Height:        Constitutional: Patient is somnolent.  He opens eyes to voice and touch but minimally interacts. Eyes:  EOMI, irises appear normal, anicteric sclera,  ENMT: Lips appear normal, oropharynx mucosa, tongue, posterior pharynx appear normal  Neck: neck appears normal, no masses, normal ROM, no thyromegaly, no JVD  CVS: S1-S2 clear, no murmur rubs or gallops, no LE edema, normal pedal pulses  Respiratory:  Coarse rhonchi on exam but this appears to be primarily upper airway noise.Marland Kitchen Respiratory effort mildly increased. Abdomen: soft nontender, nondistended, normal bowel sounds, PEG tube in place Musculoskeletal: : no cyanosis, clubbing or edema noted bilaterally Neuro: unable to perform Psych:  Unable to perform Skin: no rashes or lesions or ulcers, no induration or nodules    Data reviewed:  I have personally reviewed the recent labs and imaging studies  Pertinent Labs:   Glucose 148 BUN 48/Creatinine 1.77/GFR 37 - appears to be stable at this time Hgb 9.5 - decreasing since admission, 12.4 at that time WBC 7.6  Heme negative   Inpatient Medications:   Scheduled Meds: . albuterol  2.5 mg Nebulization TID  . amLODipine  10 mg Oral Daily  . chlorhexidine  15 mL Mouth Rinse BID  . diclofenac sodium  2 g Topical QID  . feeding supplement (PRO-STAT SUGAR FREE 64)  30 mL Per Tube Daily  . free water  200 mL Per Tube Q3H  . hydrocortisone cream   Topical TID  . mouth rinse  15  mL Mouth Rinse q12n4p  . methylphenidate  5 mg Per Tube BID  . metoprolol tartrate  25 mg Oral BID  . multivitamin  15 mL Per Tube Daily   Continuous Infusions: . ceFEPime (MAXIPIME) IV 2 g (02/22/18 0921)  . feeding supplement (GLUCERNA 1.2 CAL) 1,000 mL (02/22/18 0858)     Radiological Exams on Admission: No results found.  Impression/Recommendations Active Problems:   Intraparenchymal hemorrhage of  brain (HCC)   Hemiparesis affecting right side as late effect of stroke (HCC)   Aphasia, post-stroke   Dysphagia, post-stroke   Dysphasia   Anemia of chronic disease   Transaminitis   Multifocal pneumonia  -The patient appears to be appropriately medically managed but is not meeting rehab goals. -He has had a number of setbacks along the way and this may have delayed his overall trajectory, but this is not certain given the extent of his stroke and deficits. -Would suggest consideration of LTACH, since he does have ongoing acute needs at this time. -Continue to treat multifocal PNA with Cefepime to complete treatment duration, but he is not hemodynamically unstable and in need of transfer to the acute hospital at this time. -He has ongoing significant upper airway noise and may need ongoing RT support in clearing his airway; additional medications for control of secretions may also be considered. -Monitor anemia; if ongoing decrease, consider transfusion, as this may improve his energy and help to facilitate his participation in rehab. -Would suggest adding back glucose monitoring and SSI coverage if allowed by his wife. -This is a devastating outcome for his wife and she appears to need ongoing support to assist with her grieving and acceptance.   Thank you for this consultation.  Our [facility] hospitalist team will follow the patient with you.   Time Spent: 100 minutes  Karmen Bongo M.D. [facility] 02/22/2018, 7:01 PM

## 2018-02-22 NOTE — Patient Care Conference (Signed)
Inpatient RehabilitationTeam Conference and Plan of Care Update Date: 02/22/2018   Time: 10:40 AM    Patient Name: Adam Yang      Medical Record Number: [id number]  Date of Birth: Sep 17, 1945 Sex: Male         Room/Bed: 4W21C/4W21C-01 Payor Info: Payor: [place] / Plan: BC[employer] MEDICARE / Product Type: *No Product type* /    Admitting Diagnosis: Lt CVA  Admit Date/Time:  02/02/2018  5:56 PM Admission Comments: No comment available   Primary Diagnosis:  <principal problem not specified> Principal Problem: <principal problem not specified>  Patient Active Problem List   Diagnosis Date Noted  . Multifocal pneumonia 02/22/2018  . Aspiration pneumonia of right lower lobe due to milk ([place])   . Dysphasia   . Anemia of chronic disease   . Hypernatremia   . Transaminitis   . Hypertensive emergency 02/02/2018  . Intraparenchymal hemorrhage of brain ([place]) 02/02/2018  . Hemiparesis affecting right side as late effect of stroke ([place])   . Aphasia, post-stroke   . Dysphagia, post-stroke   . Nontraumatic subcortical hemorrhage of left cerebral hemisphere ([place])   . Diabetes mellitus type 2 in nonobese (HCC)   . AKI (acute kidney injury) ([place])   . Hyponatremia   . Chronic kidney disease   . Cytotoxic brain edema ([place]) 01/28/2018  . Brain herniation ([place]) 01/28/2018  . IVH (intraventricular hemorrhage) ([place]) 01/27/2018  . Right inguinal hernia 08/23/2017  . Chronic kidney disease, stage III (moderate) ([place]) 02/18/2017  . 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 Other fatigue 06/18/2016  . Essential hypertension 10/17/2015  . Mucositis due to chemotherapy 01/24/2015  . Hyperglycemia due to type 2 diabetes mellitus ([place]) 01/07/2015  . Chronic anxiety 12/19/2014  . Stage 2 chronic renal impairment associated with type 2 diabetes mellitus ([place]) 12/19/2014  . Type 2 diabetes mellitus, controlled, with renal complications  ([place]) 08/65/7846  . History of nasopharyngeal cancer 11/26/2014    Expected Discharge Date: Expected Discharge Date: 03/01/18  Team Members Present: Physician leading conference: Dr. Alysia Penna Social Worker Present: Ovidio Kin, LCSW Nurse Present: Other (comment)(Angelina Jorge Ny) PT Present: Lavone Nian, PT OT Present: Simonne Come, OT SLP Present: Charolett Bumpers, SLP PPS Coordinator present : Daiva Nakayama, RN, CRRN     Current Status/Progress Goal Weekly Team Focus  Medical   Debility aphasia severe dysphagia, severe right hemiplegia  Obtain medical stability, improve level of alertness  Avoid sedating medications, treatment of aspiration pneumonia   Bowel/Bladder   incontinent of bowel & bladder, LBM 8/13, has tylenol prn  manage incontinence  timed toileting   Swallow/Nutrition/ Hydration   NPO  Total A -downgraded 8/8  oral care only and requires Max A to allow cleaning    ADL's   max-total +2 bed mobility, transfers, LB bathing/dressing at bed level total assist  downgraded to max-total assist with focus on initiation, attention, rolling  transfers, sit <> stand, standing, family education, arousal, activity tolerance   Mobility   max<>total +2 assist overall, R inattention, question behavioral component?, impaired cognition & communication, poor endurance & activity tolerance  total assist bed mobility, total assist bed<>chair, total assist sitting balance with goals focusing on pt participation and cuing  activity tolerance, transfers, bed mobility, R NMR, sitting balance, pt/family educaiton   Communication   total A   Total A downgraded 02/16/18  voicing during sining, communciate yes/no/gestures   Safety/Cognition/ Behavioral Observations  Total A for joint  attention   Total A - goal downgraded 02/16/18  joint attention, initate action of objects    Pain   c/o pain this morning, tylenol given  pain faces scale less than 5  assess & treat as needed   Skin    no break down noted, sacrum is pink & blanchable     assess daily      *See Care Plan and progress notes for long and short-term goals.     Barriers to Discharge  Current Status/Progress Possible Resolutions Date Resolved   Physician    Medical stability     Limited progress fluctuating mentation  SNF placement      Nursing                  PT  Incontinence;Nutrition means;Decreased caregiver support;Behavior;Lack of/limited family support  family unable to provide necessary +2 max<>total assist pt currently requires              OT                  SLP                SW                Discharge Planning/Teaching Needs:  Both daughter and wife are unable to provide the care pt is currently needing want to begin purusing NHP. Have begun the search      Team Discussion:  Slow progress being treated with IV antibiotics for pneumonia. No fever now and white count down. Slight iniatation today. Getting out of bed sitting in chair and up standing with plus 2 in therapies, more alert then. On 3 L of continous O2. Downgrading goals to max level. Looking for NH bed  Revisions to Treatment Plan:  NHP downgrading goals max level    Continued Need for Acute Rehabilitation Level of Care: The patient requires daily medical management by a physician with specialized training in physical medicine and rehabilitation for the following conditions: Daily direction of a multidisciplinary physical rehabilitation program to ensure safe treatment while eliciting the highest outcome that is of practical value to the patient.: Yes Daily medical management of patient stability for increased activity during participation in an intensive rehabilitation regime.: Yes Daily analysis of laboratory values and/or radiology reports with any subsequent need for medication adjustment of medical intervention for : Neurological problems;Renal problems  Elease Hashimoto 02/23/2018, 8:11 AM

## 2018-02-22 NOTE — Progress Notes (Addendum)
Occupational Therapy Session Note  Patient Details  Name: Adam Yang MRN: [id number] Date of Birth: 1946-05-11  Today's Date: 02/22/2018 OT Individual Time: 1105-1200 OT Individual Time Calculation (min): 55 min    Short Term Goals: Week 3:  OT Short Term Goal 1 (Week 3): Pt will complete rolling at bed level with max assist to decrease burden of care to allow caregiver to complete bathing/LB dressing OT Short Term Goal 2 (Week 3): Pt will initiate a grooming task with max multimodal cues in 1 of 5 attempts OT Short Term Goal 3 (Week 3): Pt will demonstrate improved attention to task with focused attention to complete a grooming task in 1 of 5 attempts  Skilled Therapeutic Interventions/Progress Updates:    Treatment session with focus on arousal and initiation during therapeutic activity.  Pt received supine in bed without clothing on.  Pt more alert, maintaining eyes open through majority of session.Engaged in rolling at bed level to allow therapist to don pants.  Pt allowing therapist to roll pt, even reaching for therapist hand on one instance to allow him to be rolled.  Donned pants with +2 assist to maintain pt in sidelying when pulling pants over hips.  Pt following one step commands to bend Lt knee to allow therapist to don pant leg.  Pt shaking head "no" when therapist attempting to adjust O2 in nares.  Completed sidelying to sitting with +2 due to pushing tendencies.  Pt engaged in visual attention task to personal tablet when attempting to engage pt in looking at pictures and song choices on tablet.  Pt's wife arrived during session and appeared to be overstimulating to pt as he did not attend to her and began closing eyes more despite her attempts to engage him.  Completed stand pivot transfer to Lt with max assist +2 with pt initiating sit > stand to allow therapists to assist pt with transfer.  Pt left in tilt in space w/c with wife present and RT arriving.  Therapy  Documentation Precautions:  Precautions Precautions: Fall Precaution Comments: R hemi, R inattention, psuher, global aphasia Restrictions Weight Bearing Restrictions: No General:   Vital Signs: Oxygen Therapy SpO2: 92 % O2 Device: Nasal Cannula O2 Flow Rate (L/min): 4 L/min Pain: Pain Assessment Pain Score: 0-No pain  See Function Navigator for Current Functional Status.   Therapy/Group: Individual Therapy  Simonne Come 02/22/2018, 4:39 PM

## 2018-02-22 NOTE — Progress Notes (Signed)
Subjective/Complaints:  No issues overnite, alert this am, wet, weak cough  ROS: limited due to language/communication   Objective: Vital Signs: Blood pressure (!) 154/82, pulse 91, temperature 99.4 F (37.4 C), temperature source Oral, resp. rate 19, height 5' 9" (1.753 m), weight 60.2 kg, SpO2 93 %. No results found. Results for orders placed or performed during the hospital encounter of 02/02/18 (from the past 72 hour(s))  Basic metabolic panel     Status: Abnormal   Collection Time: 02/20/18  6:00 AM  Result Value Ref Range   Sodium 135 135 - 145 mmol/L   Potassium 4.5 3.5 - 5.1 mmol/L   Chloride 98 98 - 111 mmol/L   CO2 26 22 - 32 mmol/L   Glucose, Bld 148 (H) 70 - 99 mg/dL   BUN 48 (H) 8 - 23 mg/dL   Creatinine, Ser 1.77 (H) 0.61 - 1.24 mg/dL   Calcium 9.0 8.9 - 10.3 mg/dL   GFR calc non Af Amer 37 (L) >60 mL/min   GFR calc Af Amer 43 (L) >60 mL/min    Comment: (NOTE) The eGFR has been calculated using the CKD EPI equation. This calculation has not been validated in all clinical situations. eGFR's persistently <60 mL/min signify possible Chronic Kidney Disease.    Anion gap 11 5 - 15    Comment: Performed at [facility] [address] [place], [place] [address]  CBC     Status: Abnormal   Collection Time: 02/20/18  6:00 AM  Result Value Ref Range   WBC 7.6 4.0 - 10.5 K/uL   RBC 3.68 (L) 4.22 - 5.81 MIL/uL   Hemoglobin 9.5 (L) 13.0 - 17.0 g/dL   HCT 29.7 (L) 39.0 - 52.0 %   MCV 80.7 78.0 - 100.0 fL   MCH 25.8 (L) 26.0 - 34.0 pg   MCHC 32.0 30.0 - 36.0 g/dL   RDW 14.9 11.5 - 15.5 %   Platelets 301 150 - 400 K/uL    Comment: Performed at [facility], [place] [address], [place], [place] [address]    Constitutional: No distress . Vital signs reviewed. HEENT: EOMI, oral membranes moist Neck: supple Cardiovascular: RRR without murmur. No JVD    Respiratory: diminished  Bilaterally without wheezes or rales. Normal effort    GI: BS +, non-tender,  non-distended  Skin:   Intact. Warm and dry. Neuro: Globally aphasia, fairly alert.  tends to right side somewhat. Motor:   0/5 Right side Musc/Skel:  No edema or tenderness in extremities Psych: flat  Assessment/Plan: 1. 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 Physiatrist is providing close team supervision and 24 hour management of active medical problems listed below. Physiatrist and rehab team continue to assess barriers to discharge/monitor patient progress toward functional and medical goals. FIM: Function - Bathing Position: Wheelchair/chair at sink Body parts bathed by patient: Chest, Right arm Body parts bathed by helper: Left arm, Front perineal area, Buttocks, Right upper leg, Left upper leg, Right lower leg, Left lower leg, Back, Abdomen Assist Level: 2 helpers  Function- Upper Body Dressing/Undressing What is the patient wearing?: Pull over shirt/dress Pull over shirt/dress - Perfomed by patient: Thread/unthread right sleeve Pull over shirt/dress - Perfomed by helper: Thread/unthread left sleeve, Put head through opening, Pull shirt over trunk Button up shirt - Perfomed by helper: Thread/unthread left  sleeve, Pull shirt around back Assist Level: 2 helpers Function - Lower Body Dressing/Undressing What is the patient wearing?: Pants, Socks, Shoes Position: Bed Pants- Performed by helper: Thread/unthread left pants leg, Pull pants up/down, Thread/unthread right pants leg Non-skid slipper socks- Performed by helper: Don/doff right sock, Don/doff left sock Socks - Performed by helper: Don/doff right sock, Don/doff left sock Shoes - Performed by helper: Don/doff right shoe, Don/doff left shoe, Fasten right, Fasten left Assist for footwear: Dependant Assist for lower body  dressing: 2 Helpers  Function - Toileting Toileting activity did not occur: No continent bowel/bladder event Assist level: Two helpers  Function - Air cabin crew transfer activity did not occur: Safety/medical concerns  Function - Chair/bed transfer Chair/bed transfer method: Lateral scoot Chair/bed transfer assist level: 2 helpers Chair/bed transfer assistive device: Sliding board, Armrests Mechanical lift: Maximove Chair/bed transfer details: Manual facilitation for placement, Manual facilitation for weight shifting, Verbal cues for safe use of DME/AE, Verbal cues for precautions/safety, Verbal cues for technique, Verbal cues for sequencing, Tactile cues for posture, Tactile cues for sequencing  Function - Locomotion: Wheelchair Will patient use wheelchair at discharge?: Yes Wheelchair activity did not occur: Safety/medical concerns Assist Level: Dependent (Pt equals 0%) Wheel 50 feet with 2 turns activity did not occur: Safety/medical concerns Wheel 150 feet activity did not occur: Safety/medical concerns Turns around,maneuvers to table,bed, and toilet,negotiates 3% grade,maneuvers on rugs and over doorsills: No Function - Locomotion: Ambulation Ambulation activity did not occur: Safety/medical concerns Assistive device: (3 muskateers + w/c follow) Max distance: 25 Assist level: 2 helpers Walk 10 feet activity did not occur: Safety/medical concerns Assist level: 2 helpers Walk 50 feet with 2 turns activity did not occur: Safety/medical concerns Walk 150 feet activity did not occur: Safety/medical concerns Walk 10 feet on uneven surfaces activity did not occur: Safety/medical concerns  Function - Comprehension Comprehension: Auditory Comprehension assist level: Understands basic less than 25% of the time/ requires cueing >75% of the time  Function - Expression Expression: Verbal Expression assistive device: Other (Comment) Expression assist level: Expresses basis  less than 25% of the time/requires cueing >75% of the time.  Function - Social Interaction Social Interaction assist level: Interacts appropriately less than 25% of the time. May be withdrawn or combative.  Function - Problem Solving Problem solving assist level: Solves basic less than 25% of the time - needs direction nearly all the time or does not effectively solve problems and may need a restraint for safety  Function - Memory Memory assist level: Recognizes or recalls less than 25% of the time/requires cueing greater than 75% of the time Patient normally able to recall (first 3 days only): None of the above  Medical Problem List and Plan: 1.Right side weakness with facial droop and dysphasiasecondary to large left frontoparietal hemorrhagic infarction with vasogenic edema and mass-effect  Continue CIR minimal progression, would rec SNF given 2+ assist level  -therapy decreased to 15/7 due to poor therapy tolerance  -Improved arousal with Ritalin   2. DVT Prophylaxis/Anticoagulation: SCDs. Monitor for any signs of DVT 3. Pain Management:Zanaflex 4 mg nightly--changed to as needed due to morning lethargy 4. Mood:Provide emotional support  -Likely some reactive depression but difficult to assess given his cognitive linguistic deficits---> consider antidepressant (discussed with wife today, no decision re: starting medication made today) 5. Neuropsych: This patientis notcapable of making decisions on hisown behalf. 6. Skin/Wound Care:Routine skin checks- PEG site looks great 7. Fluids/Electrolytes/Nutrition:Routine in and outs 8.Dysphagia: PEG placed 7/28 by INR  -  Wife is interested in a button type of gastrostomy tube this may be able to be switched out as an outpatient.needs to be at least 6wk post to switch We discussed the poor prognosis for further's feeding recovery and she is aware of this 9.Hypertension.   Lisinopril 20 mg daily, Norvasc 10 mg daily. Monitor  with increased mobility   Vitals:   02/22/18 0534 02/22/18 0842  BP:    Pulse: 91   Resp:    Temp:    SpO2: 93% 93%  ,amlodipine 60m daily,  Metoprolol 242mBID- off Lisinopril   10.Diabetes mellitus. Hemoglobin A1c 6.0.   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 12.Hyperlipidemia. Fenofibrate. 13. Hypernatremia- resolved Na+ 136 on 8/5  Cont free H20 flush 14. AKI on CKD  BUN/Creatinine still elevated 45/1.7, but looking back over the last several weeks this has improved, informed wife  On 08/19/2017 his BUN was 46 and creatinine was 2.38  -off IVF since 7/31   -H20 flushes currently 200cc q3 15. Anemia of chronic disease  Hemoglobin 12.4 on 7/30, 9.5 on 02/20/2018- stool neg x2 on 8/5  Cont to monitor  16.  Asp PNA now on Cefipime, add CPT should be medically ready for D/C by end of wk .   LOS (Days) 20[place]oltERFORMED  AnCharlett Blake/14/2019, 9:07 AM

## 2018-02-22 NOTE — Progress Notes (Addendum)
Patient noted with audible congestion. Charge nurse tried oral suctioning but could not cough up mucus to be able to be removed. Oxygen saturation was at 94% on 3LPM of oxygen. NO distress noted. Nasal canula has to be repositioned multiple times.  Respiratory therapist was called for suctioning. He has an order for respiratory to do nasotracheal suctioning. Will continue to monitor

## 2018-02-22 NOTE — Progress Notes (Addendum)
Social Work Patient ID: Adam Yang, male   DOB: 27-Jan-1946, 73 y.o.   MRN: [id number]  Met with wife to discuss team conference and insurance denial as of 8/16. She does not feel he is medically stable for transfer. She plans to talk with the MD and has the insurance number to contact to appeal this decision. MD does feel he is medically stable-no fever and white count is down, still on IV antibiotics for pneumonia. She is aware this worker will look for a NH bed and provide her with the offers then once they decide the insurance will need to be approved.  Wife has spoken with his ENT-MD and will talk to his PCP about transferring to another rehab-[facility]. Will work on NH bed offers and then on insurance approval due to 8/16 is Friday.

## 2018-02-22 NOTE — Plan of Care (Signed)
Pt will roll left and right with max A to aid caregiver with clothing management.

## 2018-02-22 NOTE — Progress Notes (Signed)
Physical Therapy Session Note  Patient Details  Name: Adam Yang MRN: [id number] Date of Birth: 1946/03/29  Today's Date: 02/22/2018 PT Individual Time: 1305-1330 PT Individual Time Calculation (min): 25 min   Short Term Goals: Week 3:  PT Short Term Goal 1 (Week 3): STG = LTG due to anticipated SNF placement.  Skilled Therapeutic Interventions/Progress Updates:  Pt received sitting up in TIS w/c with wife present in room. Therapist and wife discussed her long term goals for (6 months-1 year) pt with [facility] [place] stating she would like for him to be min assist but feels pt has had setbacks (ex: pneumonia) and does not believe pt is medically stable to d/c. Therapist educated wife on anticipation that pt will need extensive +2 assist and DME (hospital bed, hoyer lift, specialty w/c) for prolonged period of time. Therapist encouraged pt's wife to attend more therapy sessions to observe pt's current functional level. Pt transferred to dayroom via w/c total assist (wife remaining in room) and encouraged pt to initiate anterior weight shifting and sit>stand at high/low table; after significantly extra time and max multimodal cuing pt able to demonstrate anterior weight shifting but pt unable to initiate sit>stand even with total assist +1. At end of session pt left sitting in TIS w/c with all needs in reach. Pt on 4L/min supplemental oxygen throughout entire session.  Therapy Documentation Precautions:  Precautions Precautions: Fall Precaution Comments: R hemi, R inattention, psuher, global aphasia Restrictions Weight Bearing Restrictions: No   See Function Navigator for Current Functional Status.   Therapy/Group: Individual Therapy  Waunita Schooner 02/22/2018, 3:55 PM

## 2018-02-22 NOTE — Progress Notes (Signed)
Social Work Patient ID: Adam Yang, male   DOB: 1946/05/22, 72 y.o.   MRN: [id number]  Informed by [facility] Medicare his last covered day is 8/16. Will ned to see if medically stable to transfer to a NH. Will expand search for NH bed and let wife know of coverage.

## 2018-02-22 NOTE — Progress Notes (Signed)
Mouth care performed on patient and nebulizer  after chest pt manually pt unable to generate a deep cough .. Tube feeding was stop and then restarted during this event.

## 2018-02-22 NOTE — Progress Notes (Signed)
Speech Language Pathology Daily Session Note  Patient Details  Name: Adam Yang MRN: [id number] Date of Birth: [birth year]  Today's Date: 02/22/2018 SLP Individual Time: 1330-1415 SLP Individual Time Calculation (min): 45 min  Short Term Goals: Week 3: SLP Short Term Goal 1 (Week 3): With Total A, pt will initiate appropriate action with an ADL object (i.e. washcloth etc) to demonstrate function of object in 1 of 5 opportunities.  SLP Short Term Goal 2 (Week 3): With Total A cues, pt will demonstrate joint atteniton to high interest picture/object for ~ 5 seconds in 1 of 5 opportunities.  SLP Short Term Goal 3 (Week 3): With Total A, pt will produce voicing and attempt to sing common song (Amazing Shirlee Limerick, Happy Birthday) in 1 of 5 opportunities.  SLP Short Term Goal 4 (Week 3): With Total A, pt will participate in prefunctional dysphagia activities (oral stim, strengthening exercises) in 1 of 5 opportunities.   Skilled Therapeutic Interventions:Skilled ST services focused on family education, cognitive and swallow skills. Pt's wife was present for session. SLP facilitated participation in prefunctional dysphagia activities proving oral stimulation with oral care. SLP facilitated initiation of action with common objects, with max A verbal/visual cues and encouragement from wife pt participated in task in 5 out 10 attempts. Pt continues to demonstrate behavior componet impeding participation and demonstration of skills. Pt verbalized "hey" and "right" following repetition cues, and tapped hand during songs in 10% of opportunities with max A verbal/visual cues with no attempts for voicing. Pt's wife requested to speak to SLP outside room, she expressed concerns of upcoming discharge and demonstrated denial pertaining to lack of progress in therapy. SLP provided education of current skill set and how behavior is impacting demonstration of skills. Pt's wife stated understanding, however did not seem to  truly comprehend prognosis. Pt was left in room with call bell within reach and wife in room. ST reccomends to continue skilled ST services.      Function:  Eating Eating                 Cognition Comprehension Comprehension assist level: Understands basic less than 25% of the time/ requires cueing >75% of the time;Understands basic 25 - 49% of the time/ requires cueing 50 - 75% of the time  Expression   Expression assist level: Expresses basis less than 25% of the time/requires cueing >75% of the time.;Expresses basic 25 - 49% of the time/requires cueing 50 - 75% of the time. Uses single words/gestures.  Social Interaction Social Interaction assist level: Interacts appropriately less than 25% of the time. May be withdrawn or combative.  Problem Solving Problem solving assist level: Solves basic less than 25% of the time - needs direction nearly all the time or does not effectively solve problems and may need a restraint for safety  Memory Memory assist level: Recognizes or recalls less than 25% of the time/requires cueing greater than 75% of the time    Pain Pain Assessment Pain Score: 0-No pain  Therapy/Group: Individual Therapy  Emilyann Banka  [facility] 02/22/2018, 2:20 PM

## 2018-02-22 NOTE — Plan of Care (Signed)
  Problem: RH BLADDER ELIMINATION Goal: RH STG MANAGE BLADDER WITH ASSISTANCE Description STG Manage Bladder With min Assistance  Outcome: Not Progressing; incontinence   Problem: RH BOWEL ELIMINATION Goal: RH STG MANAGE BOWEL WITH ASSISTANCE Description STG Manage Bowel with min Assistance.  Outcome: Not Progressing; incontinence   Problem: RH SAFETY Goal: RH STG ADHERE TO SAFETY PRECAUTIONS W/ASSISTANCE/DEVICE Description STG Adhere to Safety Precautions With min Assistance/Device.  Outcome: Not Progressing; telesitter

## 2018-02-23 ENCOUNTER — Inpatient Hospital Stay (HOSPITAL_COMMUNITY): Payer: Medicare Other

## 2018-02-23 ENCOUNTER — Inpatient Hospital Stay (HOSPITAL_COMMUNITY): Payer: Medicare Other | Admitting: Occupational Therapy

## 2018-02-23 ENCOUNTER — Inpatient Hospital Stay (HOSPITAL_COMMUNITY): Payer: Medicare Other | Admitting: Speech Pathology

## 2018-02-23 ENCOUNTER — Inpatient Hospital Stay (HOSPITAL_COMMUNITY)
Admission: AD | Admit: 2018-02-23 | Discharge: 2018-03-02 | DRG: 177 | Disposition: A | Discharge status: Home / Self Care | Payer: Medicare Other | Source: Other Acute Inpatient Hospital | Attending: Internal Medicine | Admitting: Internal Medicine

## 2018-02-23 DIAGNOSIS — D638 Anemia in other chronic diseases classified elsewhere: Secondary | ICD-10-CM | POA: Diagnosis not present

## 2018-02-23 DIAGNOSIS — Z923 Personal history of irradiation: Secondary | ICD-10-CM | POA: Diagnosis not present

## 2018-02-23 DIAGNOSIS — I69151 Hemiplegia and hemiparesis following nontraumatic intracerebral hemorrhage affecting right dominant side: Secondary | ICD-10-CM | POA: Diagnosis not present

## 2018-02-23 DIAGNOSIS — N183 Chronic kidney disease, stage 3 (moderate): Secondary | ICD-10-CM | POA: Diagnosis not present

## 2018-02-23 DIAGNOSIS — J9601 Acute respiratory failure with hypoxia: Secondary | ICD-10-CM | POA: Diagnosis not present

## 2018-02-23 DIAGNOSIS — E785 Hyperlipidemia, unspecified: Secondary | ICD-10-CM | POA: Diagnosis not present

## 2018-02-23 DIAGNOSIS — Z85818 Personal history of malignant neoplasm of other sites of lip, oral cavity, and pharynx: Secondary | ICD-10-CM

## 2018-02-23 DIAGNOSIS — D649 Anemia, unspecified: Secondary | ICD-10-CM | POA: Diagnosis not present

## 2018-02-23 DIAGNOSIS — Z931 Gastrostomy status: Secondary | ICD-10-CM

## 2018-02-23 DIAGNOSIS — Z794 Long term (current) use of insulin: Secondary | ICD-10-CM

## 2018-02-23 DIAGNOSIS — G935 Compression of brain: Secondary | ICD-10-CM | POA: Diagnosis not present

## 2018-02-23 DIAGNOSIS — I69320 Aphasia following cerebral infarction: Secondary | ICD-10-CM | POA: Diagnosis not present

## 2018-02-23 DIAGNOSIS — I129 Hypertensive chronic kidney disease with stage 1 through stage 4 chronic kidney disease, or unspecified chronic kidney disease: Secondary | ICD-10-CM | POA: Diagnosis present

## 2018-02-23 DIAGNOSIS — J189 Pneumonia, unspecified organism: Secondary | ICD-10-CM | POA: Diagnosis not present

## 2018-02-23 DIAGNOSIS — R1310 Dysphagia, unspecified: Secondary | ICD-10-CM | POA: Diagnosis not present

## 2018-02-23 DIAGNOSIS — I69120 Aphasia following nontraumatic intracerebral hemorrhage: Secondary | ICD-10-CM | POA: Diagnosis not present

## 2018-02-23 DIAGNOSIS — Z7189 Other specified counseling: Secondary | ICD-10-CM

## 2018-02-23 DIAGNOSIS — Z515 Encounter for palliative care: Secondary | ICD-10-CM | POA: Diagnosis present

## 2018-02-23 DIAGNOSIS — R6889 Other general symptoms and signs: Secondary | ICD-10-CM | POA: Diagnosis not present

## 2018-02-23 DIAGNOSIS — Z6822 Body mass index (BMI) 22.0-22.9, adult: Secondary | ICD-10-CM | POA: Diagnosis not present

## 2018-02-23 DIAGNOSIS — I69122 Dysarthria following nontraumatic intracerebral hemorrhage: Secondary | ICD-10-CM | POA: Diagnosis not present

## 2018-02-23 DIAGNOSIS — M6281 Muscle weakness (generalized): Secondary | ICD-10-CM | POA: Diagnosis not present

## 2018-02-23 DIAGNOSIS — I69351 Hemiplegia and hemiparesis following cerebral infarction affecting right dominant side: Secondary | ICD-10-CM | POA: Diagnosis not present

## 2018-02-23 DIAGNOSIS — W06XXXA Fall from bed, initial encounter: Secondary | ICD-10-CM | POA: Diagnosis present

## 2018-02-23 DIAGNOSIS — Z431 Encounter for attention to gastrostomy: Secondary | ICD-10-CM | POA: Diagnosis not present

## 2018-02-23 DIAGNOSIS — Z66 Do not resuscitate: Secondary | ICD-10-CM | POA: Diagnosis present

## 2018-02-23 DIAGNOSIS — R414 Neurologic neglect syndrome: Secondary | ICD-10-CM | POA: Diagnosis present

## 2018-02-23 DIAGNOSIS — Z7401 Bed confinement status: Secondary | ICD-10-CM | POA: Diagnosis not present

## 2018-02-23 DIAGNOSIS — I69391 Dysphagia following cerebral infarction: Secondary | ICD-10-CM | POA: Diagnosis not present

## 2018-02-23 DIAGNOSIS — E1122 Type 2 diabetes mellitus with diabetic chronic kidney disease: Secondary | ICD-10-CM | POA: Diagnosis not present

## 2018-02-23 DIAGNOSIS — J690 Pneumonitis due to inhalation of food and vomit: Secondary | ICD-10-CM | POA: Diagnosis not present

## 2018-02-23 DIAGNOSIS — J181 Lobar pneumonia, unspecified organism: Secondary | ICD-10-CM | POA: Diagnosis not present

## 2018-02-23 DIAGNOSIS — E440 Moderate protein-calorie malnutrition: Secondary | ICD-10-CM | POA: Diagnosis not present

## 2018-02-23 DIAGNOSIS — Z79899 Other long term (current) drug therapy: Secondary | ICD-10-CM

## 2018-02-23 DIAGNOSIS — M2550 Pain in unspecified joint: Secondary | ICD-10-CM | POA: Diagnosis not present

## 2018-02-23 DIAGNOSIS — E870 Hyperosmolality and hypernatremia: Secondary | ICD-10-CM | POA: Diagnosis present

## 2018-02-23 DIAGNOSIS — I611 Nontraumatic intracerebral hemorrhage in hemisphere, cortical: Secondary | ICD-10-CM | POA: Diagnosis not present

## 2018-02-23 DIAGNOSIS — G936 Cerebral edema: Secondary | ICD-10-CM | POA: Diagnosis not present

## 2018-02-23 DIAGNOSIS — C119 Malignant neoplasm of nasopharynx, unspecified: Secondary | ICD-10-CM | POA: Diagnosis not present

## 2018-02-23 DIAGNOSIS — I2510 Atherosclerotic heart disease of native coronary artery without angina pectoris: Secondary | ICD-10-CM | POA: Diagnosis not present

## 2018-02-23 DIAGNOSIS — Y95 Nosocomial condition: Secondary | ICD-10-CM | POA: Diagnosis present

## 2018-02-23 DIAGNOSIS — I615 Nontraumatic intracerebral hemorrhage, intraventricular: Secondary | ICD-10-CM | POA: Diagnosis not present

## 2018-02-23 LAB — BASIC METABOLIC PANEL
Anion gap: 10 (ref 5–15)
BUN: 52 mg/dL — AB (ref 8–23)
CHLORIDE: 100 mmol/L (ref 98–111)
CO2: 27 mmol/L (ref 22–32)
CREATININE: 1.62 mg/dL — AB (ref 0.61–1.24)
Calcium: 9.2 mg/dL (ref 8.9–10.3)
GFR calc Af Amer: 48 mL/min — ABNORMAL LOW (ref >60)
GFR calc non Af Amer: 41 mL/min — ABNORMAL LOW (ref >60)
Glucose, Bld: 183 mg/dL — ABNORMAL HIGH (ref 70–99)
Potassium: 4.6 mmol/L (ref 3.5–5.1)
SODIUM: 137 mmol/L (ref 135–145)

## 2018-02-23 LAB — CBC
HCT: 30.4 % — ABNORMAL LOW (ref 39.0–52.0)
HEMOGLOBIN: 9.5 g/dL — AB (ref 13.0–17.0)
MCH: 25.6 pg — ABNORMAL LOW (ref 26.0–34.0)
MCHC: 31.3 g/dL (ref 30.0–36.0)
MCV: 81.9 fL (ref 78.0–100.0)
PLATELETS: 349 10*3/uL (ref 150–400)
RBC: 3.71 MIL/uL — ABNORMAL LOW (ref 4.22–5.81)
RDW: 15.4 % (ref 11.5–15.5)
WBC: 11.4 10*3/uL — ABNORMAL HIGH (ref 4.0–10.5)

## 2018-02-23 LAB — MRSA PCR SCREENING: MRSA BY PCR: NEGATIVE

## 2018-02-23 MED ORDER — METRONIDAZOLE 50 MG/ML ORAL SUSPENSION: 500.0000 mg | Freq: Three times a day (TID) | ORAL | Status: DC

## 2018-02-23 MED ORDER — PRO-STAT SUGAR FREE PO LIQD
30.0000 mL | Freq: Every day | ORAL | Status: DC
  Administered 2018-02-24 – 2018-02-25 (×2): 30 mL
  Filled 2018-02-23 (×2): qty 30

## 2018-02-23 MED ORDER — METOPROLOL TARTRATE 25 MG PO TABS
25.0000 mg | ORAL_TABLET | Freq: Two times a day (BID) | ORAL | Status: DC
  Administered 2018-02-23 – 2018-03-02 (×14): 25 mg
  Filled 2018-02-23 (×14): qty 1

## 2018-02-23 MED ORDER — FREE WATER
200.0000 mL | Status: DC
  Administered 2018-02-23 – 2018-02-25 (×13): 200 mL

## 2018-02-23 MED ORDER — METRONIDAZOLE 50 MG/ML ORAL SUSPENSION
500.0000 mg | Freq: Three times a day (TID) | ORAL | Status: DC
  Administered 2018-02-23 – 2018-02-27 (×11): 500 mg
  Filled 2018-02-23 (×15): qty 10

## 2018-02-23 MED ORDER — AMLODIPINE BESYLATE 10 MG PO TABS
10.0000 mg | ORAL_TABLET | Freq: Every day | ORAL | Status: DC
  Administered 2018-02-24 – 2018-02-28 (×5): 10 mg via ORAL
  Filled 2018-02-23 (×5): qty 1

## 2018-02-23 MED ORDER — INSULIN ASPART 100 UNIT/ML ~~LOC~~ SOLN: 0.0000 [IU] | SUBCUTANEOUS | Status: DC

## 2018-02-23 MED ORDER — CHLORHEXIDINE GLUCONATE 0.12 % MT SOLN
15.0000 mL | Freq: Two times a day (BID) | OROMUCOSAL | Status: DC
  Administered 2018-02-23 – 2018-03-02 (×14): 15 mL via OROMUCOSAL
  Filled 2018-02-23 (×13): qty 15

## 2018-02-23 MED ORDER — ARTIFICIAL TEARS OPHTHALMIC OINT
1.0000 "application " | TOPICAL_OINTMENT | OPHTHALMIC | Status: DC | PRN
  Filled 2018-02-23: qty 3.5

## 2018-02-23 MED ORDER — ORAL CARE MOUTH RINSE
15.0000 mL | OROMUCOSAL | Status: DC
  Administered 2018-02-23 – 2018-03-02 (×54): 15 mL via OROMUCOSAL

## 2018-02-23 MED ORDER — METHYLPHENIDATE HCL 5 MG PO TABS
5.0000 mg | ORAL_TABLET | Freq: Two times a day (BID) | ORAL | Status: DC
  Administered 2018-02-24 – 2018-03-02 (×12): 5 mg
  Filled 2018-02-23 (×13): qty 1

## 2018-02-23 MED ORDER — DICLOFENAC SODIUM 1 % TD GEL
2.0000 g | Freq: Four times a day (QID) | TRANSDERMAL | Status: DC
  Administered 2018-02-23 – 2018-03-01 (×13): 2 g via TOPICAL
  Filled 2018-02-23: qty 100

## 2018-02-23 MED ORDER — ATORVASTATIN CALCIUM 10 MG PO TABS
20.0000 mg | ORAL_TABLET | Freq: Every day | ORAL | Status: DC
  Administered 2018-02-24 – 2018-02-28 (×5): 20 mg via ORAL
  Filled 2018-02-23 (×5): qty 2

## 2018-02-23 MED ORDER — TIZANIDINE HCL 4 MG PO TABS: 4.0000 mg | ORAL_TABLET | Freq: Every evening | ORAL | Status: DC | PRN

## 2018-02-23 MED ORDER — METRONIDAZOLE 50 MG/ML ORAL SUSPENSION
500.0000 mg | Freq: Three times a day (TID) | ORAL | Status: DC
  Filled 2018-02-23 (×2): qty 10

## 2018-02-23 MED ORDER — GLUCERNA 1.2 CAL PO LIQD
1000.0000 mL | ORAL | Status: DC
  Administered 2018-02-23 – 2018-02-24 (×3): 1000 mL
  Filled 2018-02-23 (×4): qty 1000

## 2018-02-23 MED ORDER — ADULT MULTIVITAMIN LIQUID CH
15.0000 mL | Freq: Every day | ORAL | Status: DC
  Administered 2018-02-24 – 2018-03-02 (×7): 15 mL
  Filled 2018-02-23 (×7): qty 15

## 2018-02-23 MED ORDER — SODIUM CHLORIDE 0.9 % IV SOLN
2.0000 g | INTRAVENOUS | Status: AC
  Administered 2018-02-24 – 2018-02-27 (×4): 2 g via INTRAVENOUS
  Filled 2018-02-23 (×4): qty 2

## 2018-02-23 MED ORDER — FENOFIBRATE 160 MG PO TABS
160.0000 mg | ORAL_TABLET | Freq: Every day | ORAL | Status: DC
  Administered 2018-02-24 – 2018-02-28 (×5): 160 mg via ORAL
  Filled 2018-02-23 (×5): qty 1

## 2018-02-23 MED ORDER — ALBUTEROL SULFATE (2.5 MG/3ML) 0.083% IN NEBU: 2.5000 mg | INHALATION_SOLUTION | Freq: Three times a day (TID) | RESPIRATORY_TRACT | Status: DC

## 2018-02-23 NOTE — Progress Notes (Signed)
Pharmacy Antibiotic Note  Adam Yang is a 72 y.o. male admitted on 02/23/2018 with pneumonia.  Pharmacy has been consulted for Cefepime dosing.  Pt is transferring from [place] back to the floor for worsening PNA.  WBC 7.6>>11.4. Noted renal dysfunction.   Plan: Cont Cefepime 2g IV q24h MD adding Flagyl for aspiration coverage Would have low threshold to add vancomycin   Temp (24hrs), Avg:98.9 F (37.2 C), Min:98.3 F (36.8 C), Max:99.4 F (37.4 C)  Recent Labs  Lab 02/18/18 0922 02/20/18 0600 02/23/18 1505  WBC 10.1 7.6 11.4*  CREATININE 1.91* 1.77* 1.62*    Estimated Creatinine Clearance: 36.1 mL/min (A) (by C-G formula based on SCr of 1.62 mg/dL (H)).    No Known Allergies   Narda Bonds 02/23/2018 10:45 PM

## 2018-02-23 NOTE — Progress Notes (Signed)
Social Work Patient ID: Adam Yang, male   DOB: July 19, 1945, 72 y.o.   MRN: [id number]  Dr. Florene Glen in to evaluate pt for transfer to acute services. He was planning on contacting wife to discuss. Awaiting word regarding this. [employer] contacted regarding possible transfer to acute. Will await decision.

## 2018-02-23 NOTE — Progress Notes (Signed)
Occupational Therapy Session Note  Patient Details  Name: Adam Yang MRN: [id number] Date of Birth: August 07, 1945  Today's Date: 02/23/2018 OT Individual Time: 1100-1133 OT Individual Time Calculation (min): 33 min    Short Term Goals: Week 3:  OT Short Term Goal 1 (Week 3): Pt will complete rolling at bed level with max assist to decrease burden of care to allow caregiver to complete bathing/LB dressing OT Short Term Goal 2 (Week 3): Pt will initiate a grooming task with max multimodal cues in 1 of 5 attempts OT Short Term Goal 3 (Week 3): Pt will demonstrate improved attention to task with focused attention to complete a grooming task in 1 of 5 attempts  Skilled Therapeutic Interventions/Progress Updates:    Treatment session with focus on arousal and participation in treatment session.  Pt received in bed, opening eyes upon therapist greeting.  Pt responsive to therapist when asked if he would like music played with pt stating "no" and grinning when therapist joked that he did not want her to sing.  Pt followed one step commands inconsistently, bending Lt knee to command but maintaining position to allow therapist to roll him.  Completed bed mobility with +2 for sit EOB.  Pt with moderated pushing tendencies this session in sitting, tolerating sitting EOB in preparation for transfer to w/c.  Pt agreeable to transfer to w/c by nodding head "yes" when encouraged by therapist.  However during preparation for transfer and during transfer pt with increasing pushing and even resisting therapist assist for transfer.  Therapist utilizing calm and reassuring voice but pt increasing resistance such that he was compromising his and therapists' safety.  Pt returned to supine in bed with +3 for safety.  Positioned with HOB 48* for increased airway and arousal.  RN aware of pt resisting activity.  Pt missed remaining 27 mins of scheduled time.  Therapy Documentation Precautions:  Precautions Precautions:  Fall Precaution Comments: R hemi, R inattention, psuher, global aphasia Restrictions Weight Bearing Restrictions: No General:   Vital Signs: Therapy Vitals Pulse Rate: 87 Resp: 20 Patient Position (if appropriate): Lying Oxygen Therapy SpO2: 95 % O2 Device: Nasal Cannula O2 Flow Rate (L/min): 3 L/min Pain:  Pt with no c/o pain, however moaning intermittently with movement.  See Function Navigator for Current Functional Status.   Therapy/Group: Individual Therapy  Simonne Come 02/23/2018, 12:05 PM

## 2018-02-23 NOTE — Progress Notes (Signed)
Patient noted to be audibly rattling with mucus in his throat this morning, Suctioning was attempted but was not able to get far enough in the back of the throat to get much results. Patient will not allow the catheter to go further. Respiratory therapy was called & asked about nasotracheal suctioning & informed of patients status. Was told that the family does not approve of it & patient refuses nasotracheal suctioning. Respiratory therapist came to the floor & did attempt to suction patient, but patient grabbed the yanker catheter & shook his head. Patient also has a low grade temp & his blood pressures have been in the 160s. Oxygen saturation ranges from 88-94. He was repositioned & sat up to 48 degrees to help him mobilize the secretions but he could not bring anything up. He has no prn breathing treatments & continuously removes his oxygen nasal canula which is delivering at 3lpm of oxygen with humidity. Patient is restless & stated to moan. When asked if he was in pain, he did not answer. Attempted to suction again but he shook his head & would not allow the catheter to the back of the throat.  Tylenol was given for possible pain & to address temperature. Respirations are labored, patient is sweaty On call provider was called for interventions. No new interventions started at this time. Patients wife called & was made aware of patients condition. Nurse was told to massage his throat (lymph nodes) & to ask respiratory to use a different catheter. She also stated that he uses mucinex at home & it was requested before. When this nurse went back to the patients room to again try, he had quieted down some & sounds were less. Oxygen saturation still fluctuates, but seemed not to be in as much distress. Will continue to monitor & attempt to help patient express mucus.

## 2018-02-23 NOTE — Progress Notes (Signed)
Speech Language Pathology Daily Session Note  Patient Details  Name: Adam Yang MRN: [id number] Date of Birth: 06-05-1946  Today's Date: 02/23/2018 SLP Individual Time: 7915-0569 SLP Individual Time Calculation (min): 8 min  Short Term Goals: Week 3: SLP Short Term Goal 1 (Week 3): With Total A, pt will initiate appropriate action with an ADL object (i.e. washcloth etc) to demonstrate function of object in 1 of 5 opportunities.  SLP Short Term Goal 2 (Week 3): With Total A cues, pt will demonstrate joint atteniton to high interest picture/object for ~ 5 seconds in 1 of 5 opportunities.  SLP Short Term Goal 3 (Week 3): With Total A, pt will produce voicing and attempt to sing common song (Amazing Shirlee Limerick, Happy Birthday) in 1 of 5 opportunities.  SLP Short Term Goal 4 (Week 3): With Total A, pt will participate in prefunctional dysphagia activities (oral stim, strengthening exercises) in 1 of 5 opportunities.   Skilled Therapeutic Interventions: Skilled treatment session focused on arousal and joint attention/participation in ST activities. Pt initially responded to SLP with opening his eyes but kept them closed. SLP attempted to engage pt in joint attention to high interest object. Pt pushed object away. SLP attempted to provide oral care and wash pt's face. He refused each activity. Pt continues to be dependent.      Function:    Cognition Comprehension Comprehension assist level: Understands basic less than 25% of the time/ requires cueing >75% of the time;Understands basic 25 - 49% of the time/ requires cueing 50 - 75% of the time  Expression   Expression assist level: Expresses basis less than 25% of the time/requires cueing >75% of the time.  Social Interaction Social Interaction assist level: Interacts appropriately less than 25% of the time. May be withdrawn or combative.  Problem Solving Problem solving assist level: Solves basic less than 25% of the time - needs direction nearly  all the time or does not effectively solve problems and may need a restraint for safety  Memory Memory assist level: Recognizes or recalls less than 25% of the time/requires cueing greater than 75% of the time    Pain Pain Assessment Pain Scale: Faces Faces Pain Scale: Hurts even more Pain Descriptors / Indicators: Grimacing;Guarding Pain Frequency: Occasional Pain Onset: Unable to tell Pain Intervention(s): Medication (See eMAR);Repositioned;Back rub  Therapy/Group: Individual Therapy  Wave Calzada 02/23/2018, 1:54 PM

## 2018-02-23 NOTE — Progress Notes (Signed)
[name] came in at about 0530 this morning after conversation about patients condition. After hygiene care, patients wife positioned patient on his side & coaxed him vigorously to allow suctioning. Patient was very reluctant but after a few minutes he agreed to allow it. A tracheostomy catheter was used to get deeper in the throat while wife held patient & coaxed him to open his mouth & throat & there were results. There were multiple passes made with breaks in between to allow oxygen saturation to go back to normal. After procedure completed, he seemed to have some relief & improved breathing. There was still some audible rattling, but it was much improved. Oxygen saturation was above 90% on 3 lpm, nasal canula readjusted. HOB raised over 45 degrees. Bed alarm in place, wife at bedside. Report was given to the oncoming nurse.

## 2018-02-23 NOTE — Plan of Care (Signed)
  Problem: RH Other (Specify) Goal: RH LTG OT (Specify) 5 Description RH LTG OT (Specify) 5 Note:  Pt will roll to the right and left to aide caregiver in clothing management.

## 2018-02-23 NOTE — Progress Notes (Addendum)
PROGRESS NOTE    Adam Yang  YYT:[id number] DOB: 11/10/1945 DOA: 02/02/2018 PCP: Lawerance Cruel, MD   Brief Narrative:  Per previous consult note Adam Yang is an 72 y.o. male with past medical history of hypertension, diabetes, hyperlipidemia past radiation for nasopharynx cancer who presented on 7/18 for stroke-like symptoms, found to have left frontoparietal bleed.  He was unable to obtain an MRI when his HR dropped into the 30s repeatedly.  He had severe HTN and was admitted to the ICU.  He had resultant global aphasia, right hemiplegia, and right neglect.  He improved well enough to be appropriate for transfer to CIR.  On 7/25, he was found to be unresponsive with Janille Draughon BP of 76/48.  Patient had been hypertensive to 188/100 and was given 20 mg hydralazine with resultant BP 68/39 and unresponsive 30 minutes later.  He was given IVF bolus and had improved BP to 149/73 with increased mental status.  He was transferred to CIR later that day.  On 8/10, he was found to have bibasilar PNA, R>L.  He was started on Cefepime.  He has had minimal progression in CIR and so is recommended to go to SNF based on review of rehab notes.   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  He had some episodes where he would go outside and feel presyncopal.  His BUN/Creatinine levels were elevated, and he was sent to nephrology in April - but there was no good answer for what was wrong with the kidneys.  Has been on HTN medications for about 20 years.  He had been off Norvasc but it was added back over the last year.  He was also taking fenofibrate, fish oil but really no other medications.  He was not on ASA and had no h/o CAD.  7/18, he had Willard Madrigal CVA.  He had Yony Roulston significant hemorrhage, not on anticoagulation.  He was treated in the ICU here and treated with permissive HTN.  He was able to try to talk and communicate.  He was transferred out of ICU to the stroke unit.   They felt that he would be [name] good candidate for rehab.  He was interacting and recognizing people but unable to verbalize.  He did have [name] dense right hemiparesis.  He was transferred to rehab.  He seems to have been downhill since arriving in rehab.  He came over Thursday evening, 7/25.  The night before transfer, his BP dropped to 70/40 and yet he was still transferred; she is concerned that this might have been premature.  Friday, they introduced the therapy plan.  He fell out of bed on Friday. He was moving his legs and active, "typical stroke, he thought he could do it."  He got [name] telesitter at that time.  Since then, he has seemed weaker.  Last week, SW told them this unit is for intense rehab, may need to think about long-range plans.  His family was considering bringing him home.  He seemed to be really trying with PT/OT.  He had an NG tube and he was getting accuchecks q4h - he previously had [name] PEG tube during his remote cancer and his wife was concerned about going that route again.  She requested that he be changed to Glucerna and not continue SSI.  Radiology came over the weekend to obtain her consent to transition him to PEG tube. His wife was concered about this and she wanted to  wait to proceed with the tube.  His wife requested that he not go for [place] PEG yet.  He went on Monday and got the PEG despite her concerns - although the wife did sign the consent on Saturday.  His wife is now concerned that he has [name] PEG; getting 3 hours of therapy services; pain due to the PEG - so he was not meeting goals of therapy and that "all these setbacks" may be the reason.  Last week, Monday/Tuesday he was doing well with PT.  Wednesday, he was excessively fatigued and had difficulty participating in therapy.  BP 90/50.  His wife was then concerned about excessive medication for hypertension.  His wife was then concerned that he was started on new medications not discussed with her ("Ritalin, Baclofen, Vasopressin").   She had not spoken with Dr. Letta Pate and so was again frustrated.  His wife wanted to [name] all of his medications since he had not previously been on many medications at home.  Friday evening, his wife noticed that he felt warm; the weekend nurse was concerned and sent him for [name] CXR.  He was started on antibiotics for possible aspiration and was started on respiratory toilet.  Monday, his wife was concerned that she still had not met Dr. Letta Pate.  She felt that the antibiotics were working.  She wants to ensure that he has mouth care q2h due to h/o head/neck cancer.  SW last week asked for her top 3 choices for SNF last week; they were expecting to stay in rehab until 8/21.  Today, she was informed that [facility] will only pay through 8/16 and then will need to go to another facility.  She feels like he is wasting his rehab days while he has an acute illness.  His wife reached out to the ENT and Dr. Harrington Challenger; she thought maybe he needed to go to [name] or to the acute side.  SW today told her that maybe he isn't stable for transport to [name] at this time related to his PNA.  She just wants the best for him and she just hates that he's going through this.  She is "realistic in knowing that he hasn't made any gains with PT, but he has had every set back possible".     Assessment & Plan:   Active Problems:   Intraparenchymal hemorrhage of brain (HCC)   Hemiparesis affecting right side as late effect of stroke (HCC)   Aphasia, post-stroke   Dysphagia, post-stroke   Dysphasia   Anemia of chronic disease   Transaminitis   Multifocal pneumonia   HCAP (healthcare-associated pneumonia)   HCAP  Acute Hypoxic Respiratory Failure:  CXR from 8/10 with bibasilar pneumonia worse on R.  Repeat CXR today with worsening of bilateral R greater than L lower lung pneumonia.  Will admit to acute care with evidence of worsening pneumonia.    Cefepime 8/10 - present Will add flagyl for possible aspiration Follow repeat  MRSA PCR Follow urine strep.  Sputum ordered, but unlikely to be able to obtain. No blood cultures obtained.  Will hold off on obtaining cx at this time unless pt develops fever or WBC continues to worsen.    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  Now NPO after stroke.  S/p PEG placement.   Glucerna 1.2 cal at 75 ml/hr  200 cc q 3 hrs free water Prostat 30 ml daily  Leukocytosis: worsening, 2/2 above, continue to follow, may need to broaden abx.  Consider culturing if change in status or worsening.  Anemia:  H/H stable, negative hemoccult, follow  CKD III: unclear baseline, currently better than recent baseline, follow.  Apparently follows with renal as outpatinet.  Nasopharyngeal cancer s/p radiation: follows with ENT  HLD: finofibrate, fish oil, lipitor  T2DM: SSI  Hypertension: continue norvasc 10.  HTNsive emergency on initial presentation. ** avoid IV hydralazine as pt with hypotension with this  Goals of Care:  Discussed with daughter and mother over phone.  Recommended palliative care consult to discuss goals of care.  Discussed poor prognosis with his recent hemorrhagic stroke and now likely aspiration pneumonia.  Family agreeable to discuss with palliative care.  See above for many family concerns regarding his course up until this point.   DVT prophylaxis: SCD's due to bleeding risk with ICH Code Status: DNR Family Communication: daughter and wife Disposition Plan: pending   Consultants:   Taking over from Rehab  Procedures:    PEG on 7/29 Study Conclusions  - Left ventricle: The cavity size was normal. Systolic function was   vigorous. The estimated ejection fraction was in the range of 65%   to 70%. Wall motion was normal; there were no regional wall   motion abnormalities. Left ventricular diastolic function   parameters were normal. - Aortic valve: Trileaflet; moderately thickened, moderately   calcified leaflets. Transvalvular velocity was within the normal   range. There was no stenosis. There was no regurgitation. - Mitral valve: There was no regurgitation. - Right ventricle: The cavity size was normal. Wall thickness was   normal. Systolic function was normal. - Tricuspid valve: There was no regurgitation. - Pulmonary arteries: Systolic pressure could not be accurately   estimated.  Impressions:  - Limited study quality, hyperdynamic LVEF, aortic valve leaflets   are calcified, no stenosis, no source of embolism was seen.  Antimicrobials:  Anti-infectives (From admission, onward)   Start     Dose/Rate Route Frequency Ordered Stop   02/18/18 1030  ceFEPIme (MAXIPIME) 2 g in sodium chloride 0.9 % 100 mL IVPB     2 g 200 mL/hr over 30 Minutes Intravenous Every 24 hours 02/18/18 0955 02/28/18 1029   02/06/18 0930  ceFAZolin (ANCEF) IVPB 2g/100 mL premix     1 g 100 mL/hr over 30 Minutes Intravenous  Once 02/06/18 0906 02/06/18 1051     Subjective: Moaning.  Not meaningfully interacting.   Objective: Vitals:   02/23/18 0810 02/23/18 1420 02/23/18 1602 02/23/18 1700  BP:  126/69    Pulse: 87 77 81 89  Resp: _0 Temp:  99.4 F (37.4 C)    TempSrc:  Oral    SpO2: 99% 91% 94% 90%  Weight:      Height:        Intake/Output Summary (Last 24 hours) at 02/23/2018 1908 Last data filed at 02/23/2018 1844 Gross per 24 hour  Intake 0 ml  Output -  Net 0 ml   Filed Weights   02/18/18 0330 02/19/18 0630 02/22/18 0432  Weight: 61.1 kg 60.3 kg 60.2  kg    Examination:  General  exam: moaning, eyes closed Respiratory system: Clear to auscultation. No increased WOB.  Cardiovascular system: S1 & S2 heard, RRR Gastrointestinal system: Abdomen is nondistended, soft and nontender. Peg tube in place. Central nervous system: Moaning.  Moving left side.  Not following commands.  Extremities: No LEE Skin: No rashes, lesions or ulcers Psychiatry: Judgement and insight appear normal. Mood & affect appropriate.     Data Reviewed: I have personally reviewed following labs and imaging studies  CBC: Recent Labs  Lab 02/18/18 0922 02/20/18 0600 02/23/18 1505  WBC 10.1 7.6 11.4*  HGB 10.9* 9.5* 9.5*  HCT 33.7* 29.7* 30.4*  MCV 79.9 80.7 81.9  PLT 331 301 034   Basic Metabolic Panel: Recent Labs  Lab 02/18/18 0922 02/20/18 0600 02/23/18 1505  NA 136 135 137  K 4.1 4.5 4.6  CL 96* 98 100  CO2 _0 GLUCOSE 124* 148* 183*  BUN 45* 48* 52*  CREATININE 1.91* 1.77* 1.62*  CALCIUM 9.2 9.0 9.2   GFR: Estimated Creatinine Clearance: 36.1 mL/min (Nerine Pulse) (by C-G formula based on SCr of 1.62 mg/dL (H)). Liver Function Tests: Recent Labs  Lab 02/18/18 0922  AST 24  ALT 21  ALKPHOS 51  BILITOT 0.5  PROT 7.3  ALBUMIN 2.4*   No results for input(s): LIPASE, AMYLASE in the last 168 hours. No results for input(s): AMMONIA in the last 168 hours. Coagulation Profile: No results for input(s): INR, PROTIME in the last 168 hours. Cardiac Enzymes: No results for input(s): CKTOTAL, CKMB, CKMBINDEX, TROPONINI in the last 168 hours. BNP (last 3 results) No results for input(s): PROBNP in the last 8760 hours. HbA1C: No results for input(s): HGBA1C in the last 72 hours. CBG: No results for input(s): GLUCAP in the last 168 hours. Lipid Profile: No results for input(s): CHOL, HDL, LDLCALC, TRIG, CHOLHDL, LDLDIRECT in the last 72 hours. Thyroid Function Tests: No results for input(s): TSH, T4TOTAL, FREET4, T3FREE, THYROIDAB in the last 72 hours. Anemia Panel: No results  for input(s): VITAMINB12, FOLATE, FERRITIN, TIBC, IRON, RETICCTPCT in the last 72 hours. Sepsis Labs: No results for input(s): PROCALCITON, LATICACIDVEN in the last 168 hours.  No results found for this or any previous visit (from the past 240 hour(s)).       Radiology Studies: Dg Chest Port 1 View  Result Date: 02/23/2018 CLINICAL DATA:  Pneumonia EXAM: PORTABLE CHEST 1 VIEW COMPARISON:  02/18/2018, PET CT 06/17/2015 FINDINGS: Worsened airspace disease at the left base and right mid to lower lung. No pleural effusion. Stable cardiomediastinal silhouette. No pneumothorax. IMPRESSION: Worsening of bilateral right greater than left lower lung pneumonia. Electronically Signed   By: Donavan Foil M.D.   On: 02/23/2018 16:43        Scheduled Meds: . albuterol  2.5 mg Nebulization TID  . amLODipine  10 mg Oral Daily  . chlorhexidine  15 mL Mouth Rinse BID  . diclofenac sodium  2 g Topical QID  . feeding supplement (PRO-STAT SUGAR FREE 64)  30 mL Per Tube Daily  . free water  200 mL Per Tube Q3H  . hydrocortisone cream   Topical TID  . mouth rinse  15 mL Mouth Rinse q12n4p  . methylphenidate  5 mg Per Tube BID  . metoprolol tartrate  25 mg Oral BID  . multivitamin  15 mL Per Tube Daily   Continuous Infusions: . ceFEPime (MAXIPIME) IV 200 mL/hr at 02/23/18 1305  . feeding supplement (GLUCERNA  1.2 CAL) 75 mL/hr at 02/23/18 1304     LOS: 21 days    Time spent: over 30 min MDM high with AHRF, pneumonia    Fayrene Helper, MD [facility] Pager [phone]  If 7PM-7AM, please contact night-coverage [email or web address] Password [facility] 02/23/2018, 7:08 PM

## 2018-02-23 NOTE — Discharge Summary (Signed)
Adam Yang, Adam Yang MEDICAL RECORD [id number] ACCOUNT [id number] DATE OF BIRTH:1946/01/30 FACILITY: [facility] LOCATION: [facility] PHYSICIAN:ANDREW Letta Pate, MD  DISCHARGE SUMMARY  DATE OF DISCHARGE:  02/02/2018  DISCHARGE DIAGNOSES: 1.  Large left frontoparietal hemorrhagic infarction with vasogenic edema and mass effect. 2.  Sequential compression devices for deep venous thrombosis prophylaxis. 3.  Pain management. 4.  Failure to thrive. 5.  Dysphagia, status post PEG tube 02/05/2018. 6.  Hypertension. 7.  Diabetes mellitus. 8.  History of nasopharynx cancer. 9.  Hyperlipidemia. 10.  Acute kidney injury on chronic kidney disease. 11.  Anemia of chronic disease. 12.  Aspiration pneumonia.  HOSPITAL COURSE:  This is a 72 year old right-handed male with history of hypertension, diabetes, CKD, nasopharynx cancer with radiation, remote tobacco abuse, presented 01/26/2018 with right-sided weakness, facial droop, bradycardia.  Urine drug screen  negative.  Cranial CT scan showed a large hemorrhagic infarction 7.8 x 4.4 x 5.2 cm, vasogenic edema and midline shift.  CT angiogram of head and neck showed no acute or focal vascular lesions.  Echocardiogram with ejection fraction of 70%.  No cardiac  source of emboli.  The patient did not receive tPA.  Follow up neurology services.  Cranial CT scan 01/28/2018 showed no significant interval change in size of hemorrhage.  No hydrocephalus or ventricular trapping.  IV fluids maintained for hydration.   The patient was admitted for comprehensive rehab program.  PAST MEDICAL HISTORY:  See discharge diagnoses.  SOCIAL HISTORY:  Lives with his wife.  FUNCTIONAL STATUS:  Upon admission to rehab services was +2 physical assist, sit to stand max assist, stand-pivot transfers max, total assist with activities of daily living.  PHYSICAL EXAMINATION: VITAL SIGNS:  Blood pressure 170/86, pulse 97, temperature 98, respirations 18. GENERAL:  Lethargic male,  facial weakness, initially with nasogastric tube in place.  Pupils reactive to light. LUNGS:  Decreased breath sounds. CARDIOVASCULAR:  Rate controlled. ABDOMEN:  Soft, nontender, good bowel sounds. NEUROLOGIC:  The patient globally aphasic.  REHABILITATION HOSPITAL COURSE:  The patient was admitted to inpatient rehab services.  Therapies initiated on a 3-hour daily basis, consisting of physical therapy, occupational therapy, speech therapy and rehabilitation nursing.  The following issues  were addressed during patient's rehabilitation stay.  Pertaining to the patient's large left frontoparietal hemorrhagic infarction, remained stable.  He would follow up with neurology services.  Slow progressive gains.  SCDs for DVT prophylaxis.  Ritalin  was added to patient's regimen to help stimulate mood and focus.  Blood pressure is controlled.  He was somewhat orthostatic.  He remained on low-dose Norvasc as well as Lopressor.  Noted significant dysphagia.  Gastrostomy tube placed 02/05/2018 by  interventional radiology.  He remained n.p.o. with followup for speech therapy.  Blood sugars monitored while on tube feeds.  He did have a history of nasopharynx cancer.  He received radiation in the past.  Acute kidney injury on chronic kidney disease.   Creatinine 1.7-1.62.  He has been off of IV fluids since 02/08/2018, receiving water boluses.  Anemia of chronic disease stable.  Stool negative guaiacs.  He was treated with cefepime for aspiration pneumonia.  The patient received weekly  therapies.   Slow progressive gains.  Decreased the 15 hours over 7 due to limited tolerance for therapies.  Ongoing conversations held with wife and daughter in regard to patient's overall medical conditions and ongoing discharge planning.  Due to patient's limited  gains during rehab services, multivitamin medical [facility] hospitalist was consulted for medical management.  It was felt  the patient would be better served on the acute  care service for ongoing medical conditions.  Again, all issues in regard to this were  discussed with wife.  Discharge took place 02/23/2018.  All medication changes made as per triad hospitalist.  LN/NUANCE D:02/23/2018 T:02/23/2018 JOB:[id number]

## 2018-02-23 NOTE — Progress Notes (Signed)
As per pt.'s wife tube feeding was hold from 9 AM to 1300 pm.As per Dr. Buford Dresser verbal order,Oxigen has been wean and O2 sat. Has been recorder.Keep monitoring pt. Closely and assessing pt.'s and family needs.

## 2018-02-23 NOTE — Progress Notes (Signed)
Nutrition Follow-up  DOCUMENTATION CODES:   Non-severe (moderate) malnutrition in context of chronic illness  INTERVENTION:  ContinueGlucerna 1.2 formula viaPEGatgoal rate of 35m/hr x 20 hours (may hold TF for up to 4 hours for therapy)with 30 ml Prostat once daily.  Free water flushes of 200 mlevery 3 hours. MD to adjust.  Tube feeding regimen provides1900kcal (100% of needs),105grams of protein, and 28130mof H2O.  NUTRITION DIAGNOSIS:   Moderate Malnutrition related to chronic illness(known hx cancer with dysphagia) as evidenced by moderate fat depletion, moderate muscle depletion; ongoing  GOAL:   Patient will meet greater than or equal to 90% of their needs; met via TF  MONITOR:   Weight trends, I & O's, Skin, Labs, TF tolerance  REASON FOR ASSESSMENT:   Consult Enteral/tube feeding initiation and management  ASSESSMENT:   [age]ear old right-handed male with history of hypertension, type 2 diabetes mellitus, CKD, nasopharynx cancer with radiation therapy and remote tobacco abuse.  Presented 01/26/2018 with right-sided weakness as well as facial droop and bradycardia. 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 Patient currently NPO with a Cortrak tube for nutritional support.    Therapy has been working with pt on arousal. Pt has been tolerating his tube feeds with no other difficulties. RD to continue with current orders. Will continue to monitor.    Diet Order:   Diet Order            Diet NPO time specified  Diet effective now              EDUCATION NEEDS:   Not appropriate for education at this time  Skin:  Skin Assessment: Reviewed RN Assessment  Last BM:  8/15  Height:   Ht Readings from Last 1 Encounters:  02/02/18 5' 9" (1.753 m)    Weight:   Wt Readings from Last 1 Encounters:  02/22/18 60.2 kg    Ideal Body Weight:  72.7 kg  BMI:  Body mass index is 19.6 kg/m.  Estimated Nutritional Needs:   Kcal:   1750-1900  Protein:  80-95 grams  Fluid:  >/= 1.7 L/day    StCorrin ParkerMS, RD, LDN Pager # [phone] hours/ weekend pager # [phone]

## 2018-02-23 NOTE — Progress Notes (Signed)
Subjective/Complaints:  Discussed poor neurologic and functional prognosis with patient's wife.  Discussed case with internal medicine.  They will be calling in palliative care as well.  Portable chest shows some worsening, MRSA is pending to see if antibiotic coverage needs to be broadened.  WBCs are now slightly elevated.  ROS: limited due to language/communication   Objective: Vital Signs: Blood pressure 126/69, pulse 89, temperature 99.4 F (37.4 C), temperature source Oral, resp. rate 18, height '5\' 9"'$  (1.753 m), weight 60.2 kg, SpO2 90 %. Dg Chest Port 1 View  Result Date: 02/23/2018 CLINICAL DATA:  Pneumonia EXAM: PORTABLE CHEST 1 VIEW COMPARISON:  02/18/2018, PET CT 06/17/2015 FINDINGS: Worsened airspace disease at the left base and right mid to lower lung. No pleural effusion. Stable cardiomediastinal silhouette. No pneumothorax. IMPRESSION: Worsening of bilateral right greater than left lower lung pneumonia. Electronically Signed   By: Donavan Foil M.D.   On: 02/23/2018 16:43   Results for orders placed or performed during the hospital encounter of 02/02/18 (from the past 72 hour(s))  Occult blood card to lab, stool     Status: None   Collection Time: 02/22/18 11:18 PM  Result Value Ref Range   Fecal Occult Bld NEGATIVE NEGATIVE    Comment: Performed at [facility], [address], [place], [place] [address]  CBC     Status: Abnormal   Collection Time: 02/23/18  3:05 PM  Result Value Ref Range   WBC 11.4 (H) 4.0 - 10.5 K/uL   RBC 3.71 (L) 4.22 - 5.81 MIL/uL   Hemoglobin 9.5 (L) 13.0 - 17.0 g/dL   HCT 30.4 (L) 39.0 - 52.0 %   MCV 81.9 78.0 - 100.0 fL   MCH 25.6 (L) 26.0 - 34.0 pg   MCHC 31.3 30.0 - 36.0 g/dL   RDW 15.4 11.5 - 15.5 %   Platelets 349 150 - 400 K/uL    Comment: Performed at [facility], [place] [address] [place], [place] [address]  Basic metabolic panel     Status: Abnormal   Collection Time: 02/23/18  3:05 PM  Result Value Ref Range   Sodium  137 135 - 145 mmol/L   Potassium 4.6 3.5 - 5.1 mmol/L   Chloride 100 98 - 111 mmol/L   CO2 27 22 - 32 mmol/L   Glucose, Bld 183 (H) 70 - 99 mg/dL   BUN 52 (H) 8 - 23 mg/dL   Creatinine, Ser 1.62 (H) 0.61 - 1.24 mg/dL   Calcium 9.2 8.9 - 10.3 mg/dL   GFR calc non Af Amer 41 (L) >60 mL/min   GFR calc Af Amer 48 (L) >60 mL/min    Comment: (NOTE) The eGFR has been calculated using the CKD EPI equation. This calculation has not been validated in all clinical situations. eGFR's persistently <60 mL/min signify possible Chronic Kidney Disease.    Anion gap 10 5 - 15    Comment: Performed at [facility] [address] [place], [place] [address]    Constitutional: No distress . Vital signs reviewed. HEENT: EOMI, oral membranes moist Neck: supple Cardiovascular: RRR without murmur. No JVD    Respiratory: No respiratory distress, rhonchi right-sided greater than left-sided GI: BS +, non-tender, non-distended  Skin:   Intact. Warm and dry. Neuro: Globally aphasia, fairly alert.  tends to right side somewhat. Motor:   0/5 Right side Musc/Skel:  No edema or tenderness in extremities Psych: flat  Assessment/Plan: 1. 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 Physiatrist is providing close team supervision and 24 hour management of active medical problems listed below. Physiatrist and rehab team continue to assess barriers to discharge/monitor patient progress toward functional and medical goals. FIM: Function - Bathing Position: Wheelchair/chair at sink Body parts bathed by patient: Chest, Right arm Body parts bathed by helper: Left arm, Front perineal area, Buttocks, Right upper leg, Left upper leg, Right lower leg, Left lower leg, Back, Abdomen Assist Level: 2 helpers  Function-  Upper Body Dressing/Undressing What is the patient wearing?: Pull over shirt/dress Pull over shirt/dress - Perfomed by patient: Thread/unthread right sleeve Pull over shirt/dress - Perfomed by helper: Thread/unthread left sleeve, Put head through opening, Pull shirt over trunk Button up shirt - Perfomed by helper: Thread/unthread left sleeve, Pull shirt around back Assist Level: 2 helpers Function - Lower Body Dressing/Undressing What is the patient wearing?: Pants, Socks, Shoes Position: Bed Pants- Performed by helper: Thread/unthread left pants leg, Pull pants up/down, Thread/unthread right pants leg Non-skid slipper socks- Performed by helper: Don/doff right sock, Don/doff left sock Socks - Performed by helper: Don/doff right sock, Don/doff left sock Shoes - Performed by helper: Don/doff right shoe, Don/doff left shoe, Fasten right, Fasten left Assist for footwear: Dependant Assist for lower body dressing: 2 Helpers  Function - Toileting Toileting activity did not occur: No continent bowel/bladder event Assist level: Two helpers  Function - Air cabin crew transfer activity did not occur: Safety/medical concerns  Function - Chair/bed transfer Chair/bed transfer method: Lateral scoot Chair/bed transfer assist level: 2 helpers Chair/bed transfer assistive device: Sliding board, Armrests Mechanical lift: Maximove Chair/bed transfer details: Manual facilitation for placement, Manual facilitation for weight shifting, Verbal cues for safe use of DME/AE, Verbal cues for precautions/safety, Verbal cues for technique, Verbal cues for sequencing, Tactile cues for posture, Tactile cues for sequencing  Function - Locomotion: Wheelchair Will patient use wheelchair at discharge?: Yes Wheelchair activity did not occur: Safety/medical concerns Assist Level: Dependent (Pt equals 0%) Wheel 50 feet with 2 turns activity did not occur: Safety/medical concerns Wheel 150 feet activity did not  occur: Safety/medical concerns Turns around,maneuvers to table,bed, and toilet,negotiates 3% grade,maneuvers on rugs and over doorsills: No Function - Locomotion: Ambulation Ambulation activity did not occur: Safety/medical concerns Assistive device: (3 muskateers + w/c follow) Max distance: 25 Assist level: 2 helpers Walk 10 feet activity did not occur: Safety/medical concerns Assist level: 2 helpers Walk 50 feet with 2 turns activity did not occur: Safety/medical concerns Walk 150 feet activity did not occur: Safety/medical concerns Walk 10 feet on uneven surfaces activity did not occur: Safety/medical concerns  Function - Comprehension Comprehension: Auditory Comprehension assist level: Understands basic less than 25% of the time/ requires cueing >75% of the time, Understands basic 25 - 49% of the time/ requires cueing 50 - 75% of the time  Function - Expression Expression: Nonverbal, Verbal Expression assistive device: Other (Comment) Expression assist level: Expresses basis less than 25% of the time/requires cueing >75% of the time.  Function - Social Interaction Social Interaction assist level: Interacts appropriately less than 25% of the time. May be withdrawn or combative.  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  Patient may need additional  treatment  -therapy decreased to 15/7 due to poor therapy tolerance  -Improved arousal with Ritalin   2. DVT Prophylaxis/Anticoagulation: SCDs. Monitor for any signs of DVT 3. Pain Management:Zanaflex 4 mg nightly--changed to as needed due to morning lethargy 4. Mood:Provide emotional support  -Likely some reactive depression but difficult to assess given his cognitive linguistic deficits---> consider antidepressant (discussed with wife today, no decision re: starting medication made today) 5. Neuropsych: This patientis notcapable of making decisions on hisown behalf. 6. Skin/Wound Care:Routine skin checks- PEG site looks great 7. Fluids/Electrolytes/Nutrition:Routine in and outs 8.Dysphagia: PEG placed 7/28 by INR  -Wife is interested in a button type of gastrostomy tube this may be able to be switched out as an outpatient.needs to be at least 6wk post to switch We discussed the poor prognosis for further's feeding recovery and she is aware of this 9.Hypertension.   Lisinopril 20 mg daily, Norvasc 10 mg daily. Monitor with increased mobility   Vitals:   02/23/18 1602 02/23/18 1700  BP:    Pulse: 81 89  Resp: 18   Temp:    SpO2: 94% 90%  ,amlodipine '10mg'$  daily,  Metoprolol '25mg'$  BID- off Lisinopril 126/69   10.Diabetes mellitus. Hemoglobin A1c 6.0.   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 12.Hyperlipidemia. Fenofibrate. 13. Hypernatremia- resolved Na+ 136 on 8/5  Cont free H20 flush 14. AKI on CKD  BUN/Creatinine still elevated 45/1.7, 52/1.62 today on 08/19/2017 his BUN was 46 and creatinine was 2.38  -off IVF since 7/31   -H20 flushes currently 200cc q3 15. Anemia of chronic disease  Hemoglobin 12.4 on 7/30, 9.5 on 02/20/2018- stool neg x2 on 8/5  Cont to monitor  16.  Asp PNA now on Cefipime, add CPT, may need to broaden coverage given chest x-ray and white count results, defer to internal  medicine .   LOS (Days) [place] EVALUATION WAS PERFORMED  Charlett Blake 02/23/2018, 5:54 PM

## 2018-02-23 NOTE — Progress Notes (Addendum)
Physical Therapy Session Note  Patient Details  Name: Adam Yang MRN: [id number] Date of Birth: February 13, 1946  Today's Date: 02/23/2018 PT Individual Time: 8937-3428 PT Individual Time Calculation (min): 45 min   Short Term Goals:  Week 3:  PT Short Term Goal 1 (Week 3): STG = LTG due to anticipated SNF placement.  Skilled Therapeutic Interventions/Progress Updates:   pt sitting EOB with wife, RN and NT assisting him.  Pt noted to be pushing hard with L hand toward hemi side, and communicating that he wanted to lie back down.  +2 for supine> sit and scoot to [facility] using bed features.  O2 sats 90% while briefly off of O2; rising to 94% on 3L .    Pt opended his eyes to command over 50% of requests.  He nodded and shook his head to communicate throughout session.   PROM bil heel cords, hamstrings and hip rotators.  Pt actively pushed with LLE 2/10 attempts of mass extension from mass flexed position.    Rolling R x 5 with L hand placed on bed rail, max assist; pt did participate.  Pt left resting in partial R sidelying, with soft call bell on chest, bed alarm set and floor mats in place.    Therapy Documentation Precautions:  Precautions Precautions: Fall Precaution Comments: R hemi, R inattention, psuher, global aphasia Restrictions Weight Bearing Restrictions: No  Pain: no S or S expressed      See Function Navigator for Current Functional Status.   Therapy/Group: Individual Therapy  Shaunte Tuft 02/23/2018, 10:01 AM

## 2018-02-23 NOTE — Progress Notes (Addendum)
Social Work Patient ID: Adam Yang, male   DOB: [birth year], 72 y.o.   MRN: [id number]  Spoke with pt and MD who are in agreement with pursuing [facility]. Spoke with Delmer Islam for [facility] who reports any Medicare pt will need to be on acute three days and directly transfer to them they can not be on a rehab unit and transfer directly to them. She voiced he is not eligible then and has not had a [employer] cover it also. Have let MD know and wife will need to see what the next step he would like to do is. Wife is talking now about taking him home due to not wanting him in one of those nursing homes. Will await MD input regarding next step.

## 2018-02-23 NOTE — Discharge Summary (Signed)
Discharge summary job # [id number]

## 2018-02-24 ENCOUNTER — Inpatient Hospital Stay (HOSPITAL_COMMUNITY): Payer: Medicare Other

## 2018-02-24 ENCOUNTER — Inpatient Hospital Stay (HOSPITAL_COMMUNITY): Payer: Medicare Other | Admitting: Speech Pathology

## 2018-02-24 DIAGNOSIS — E440 Moderate protein-calorie malnutrition: Secondary | ICD-10-CM

## 2018-02-24 DIAGNOSIS — J189 Pneumonia, unspecified organism: Secondary | ICD-10-CM

## 2018-02-24 LAB — COMPREHENSIVE METABOLIC PANEL
ALBUMIN: 2.1 g/dL — AB (ref 3.5–5.0)
ALT: 37 U/L (ref 0–44)
AST: 35 U/L (ref 15–41)
Alkaline Phosphatase: 76 U/L (ref 38–126)
Anion gap: 8 (ref 5–15)
BUN: 49 mg/dL — AB (ref 8–23)
CHLORIDE: 101 mmol/L (ref 98–111)
CO2: 29 mmol/L (ref 22–32)
CREATININE: 1.59 mg/dL — AB (ref 0.61–1.24)
Calcium: 9.1 mg/dL (ref 8.9–10.3)
GFR calc Af Amer: 49 mL/min — ABNORMAL LOW (ref >60)
GFR calc non Af Amer: 42 mL/min — ABNORMAL LOW (ref >60)
GLUCOSE: 166 mg/dL — AB (ref 70–99)
POTASSIUM: 4.3 mmol/L (ref 3.5–5.1)
Sodium: 138 mmol/L (ref 135–145)
Total Bilirubin: 0.3 mg/dL (ref 0.3–1.2)
Total Protein: 6.6 g/dL (ref 6.5–8.1)

## 2018-02-24 LAB — CBC
HCT: 33.7 % — ABNORMAL LOW (ref 39.0–52.0)
HEMOGLOBIN: 10.4 g/dL — AB (ref 13.0–17.0)
MCH: 25.4 pg — AB (ref 26.0–34.0)
MCHC: 30.9 g/dL (ref 30.0–36.0)
MCV: 82.2 fL (ref 78.0–100.0)
Platelets: 389 10*3/uL (ref 150–400)
RBC: 4.1 MIL/uL — ABNORMAL LOW (ref 4.22–5.81)
RDW: 15.4 % (ref 11.5–15.5)
WBC: 9.5 10*3/uL (ref 4.0–10.5)

## 2018-02-24 LAB — PROCALCITONIN: Procalcitonin: 0.29 ng/mL

## 2018-02-24 LAB — GLUCOSE, CAPILLARY
Glucose-Capillary: 158 mg/dL — ABNORMAL HIGH (ref 70–99)
Glucose-Capillary: 169 mg/dL — ABNORMAL HIGH (ref 70–99)
Glucose-Capillary: 225 mg/dL — ABNORMAL HIGH (ref 70–99)
Glucose-Capillary: 79 mg/dL (ref 70–99)

## 2018-02-24 LAB — MAGNESIUM: MAGNESIUM: 2.4 mg/dL (ref 1.7–2.4)

## 2018-02-24 MED ORDER — ACETAMINOPHEN 160 MG/5ML PO SOLN
650.0000 mg | Freq: Four times a day (QID) | ORAL | Status: DC | PRN
  Administered 2018-02-24: 650 mg via ORAL
  Filled 2018-02-24: qty 20.3

## 2018-02-24 MED ORDER — INSULIN ASPART 100 UNIT/ML ~~LOC~~ SOLN
0.0000 [IU] | SUBCUTANEOUS | Status: DC
  Administered 2018-02-24: 5 [IU] via SUBCUTANEOUS
  Administered 2018-02-25: 1 [IU] via SUBCUTANEOUS
  Administered 2018-02-25: 2 [IU] via SUBCUTANEOUS
  Administered 2018-02-25: 3 [IU] via SUBCUTANEOUS
  Administered 2018-02-25: 1 [IU] via SUBCUTANEOUS
  Administered 2018-02-26: 2 [IU] via SUBCUTANEOUS
  Administered 2018-02-26: 1 [IU] via SUBCUTANEOUS
  Administered 2018-02-26 – 2018-02-27 (×3): 2 [IU] via SUBCUTANEOUS
  Administered 2018-02-27: 1 [IU] via SUBCUTANEOUS
  Administered 2018-02-27 (×2): 2 [IU] via SUBCUTANEOUS
  Administered 2018-02-28: 5 [IU] via SUBCUTANEOUS
  Administered 2018-02-28 (×2): 3 [IU] via SUBCUTANEOUS
  Administered 2018-02-28: 5 [IU] via SUBCUTANEOUS
  Administered 2018-02-28: 1 [IU] via SUBCUTANEOUS
  Administered 2018-02-28 – 2018-03-01 (×2): 3 [IU] via SUBCUTANEOUS
  Administered 2018-03-01: 2 [IU] via SUBCUTANEOUS
  Administered 2018-03-01: 3 [IU] via SUBCUTANEOUS
  Administered 2018-03-01: 2 [IU] via SUBCUTANEOUS
  Administered 2018-03-01 (×2): 3 [IU] via SUBCUTANEOUS
  Administered 2018-03-02: 2 [IU] via SUBCUTANEOUS
  Administered 2018-03-02: 3 [IU] via SUBCUTANEOUS
  Administered 2018-03-02: 1 [IU] via SUBCUTANEOUS

## 2018-02-24 NOTE — Progress Notes (Signed)
Received communication that the patient was being discharged from our service to acute care at shift change this evening. The admitting physician called just after report asking for vitals to be taken & informed that he had put the orders in for the admission. Patient was alert & calm. No labored breathing noted. Vitals were taken & patient was informed of discharge He nodded his head. When asked how he was, patient shrugged shoulders.On call provider was called for the discharge order & was given verbal order to discharge to acute. Patient placement was called for verification & information. Order was given & they stated that they would call back. The admitting physician had already put in medication orders  Patient placement called back & stated that he was going to be admitted to 3W29. 3W was called & report was given to [facility], his nurse. Medications that were sent to the floor was also tubed to 3W. When this nurse returned to the his room, his wife & other family members were there & they were informed of the order. His items were packed & patient was transported via bed by RN with oxygen infusing via [place] at 3lpm. He tolerated transfer well, no acute distress noted.

## 2018-02-24 NOTE — Plan of Care (Signed)
Patient stable, discussed POC with patient and spouse, agreeable with plan, Palliative consult placed, denies question/concerns at this time.

## 2018-02-24 NOTE — Progress Notes (Signed)
Initial Nutrition Assessment  DOCUMENTATION CODES:   Non-severe (moderate) malnutrition in context of chronic illness  INTERVENTION:   -Continue Glucerna 1.2 @ 75 ml/hr via PEG  -Continue 30 ml Prostat daily  -Continue 200 ml free water flush every 3 hours.   Complete regimen providing 2260 kcals, 123 grams protein, and 3771 kcals daily, which meets 100% of estimated energy needs.  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  GOAL:   Patient will meet greater than or equal to 90% of their needs  MONITOR:   Labs, Weight trends, Skin, I & O's  REASON FOR ASSESSMENT:   Low [place], New TF    ASSESSMENT:   Adam Yang is an 72 y.o. male with past medical history of hypertension, diabetes, hyperlipidemia past radiation for nasopharynx cancer who presented on 7/18 for stroke-like symptoms, found to have left frontoparietal bleed  Pt admitted from CIR for HCAP.   Case discussed with CIR RD. Previous TF orders were Glucerna 1.2 @ 75 ml/hr over 20 hour period with 30 ml Prostat daily and 200 ml free water flush every 3 hours. Complete regiment providing 1900 kcals, 105 grams protein and 2815 ml free water daily.  Reviewed wt hx; noted pt has experienced a 8.5% wt loss over the past 6 months. Per CIR RD, pt weight was trending down at rehab.   Pt unable to provide hx, but was alert and greeted this RD at time of visit (per pt wife, this is an improvement).   Hx obtained from pt wife, who was able to confirm nutrition hx from CIR. She expressed concern over insulin regimen/CBGS and weight loss.   Current TF orders are as follows: Glucerna 1.2 @ 75 ml/hr with 30 ml Prostat daily and 200 ml free water flush every 3 hours. Complete regimen providing 2260 kcals, 123 grams protein, and 3771 kcals daily. This exceeds pt's  estimated nutritional needs, however, will continue current TF regimen in the setting of weight loss and malnutrition.   Labs reviewed: CBGS: 158-169 (inpatient orders for glycemic control are 0-9 units insulin aspart every 4 hours).   NUTRITION - FOCUSED PHYSICAL EXAM:    Most Recent Value  Orbital Region  Moderate depletion  Upper Arm Region  Mild depletion  Thoracic and Lumbar Region  Mild depletion  Buccal Region  Moderate depletion  Temple Region  Mild depletion  Clavicle Bone Region  Moderate depletion  Clavicle and Acromion Bone Region  Moderate depletion  Scapular Bone Region  Moderate depletion  Dorsal Hand  Mild depletion  Patellar Region  Moderate depletion  Anterior Thigh Region  Moderate depletion  Posterior Calf Region  Moderate depletion  Edema (RD Assessment)  None  Hair  Reviewed  Eyes  Reviewed  Mouth  Reviewed  Skin  Reviewed  Nails  Reviewed       Diet Order:   Diet Order            Diet NPO time specified  Diet effective now              EDUCATION NEEDS:   No education needs have been identified at this time  Skin:  Skin Assessment: Skin Integrity Issues: Skin Integrity Issues:: Other (Comment) Other: sacral wound  Last BM:  02/23/18  Height:   Ht Readings from Last 1 Encounters:  02/24/18 5\' 9"  (1.753 m)    Weight:  Wt Readings from Last 1 Encounters:  02/24/18 60.2 kg    Ideal Body Weight:  72.7 kg  BMI:  Body mass index is 19.6 kg/m.  Estimated Nutritional Needs:   Kcal:  1600-1800  Protein:  85-100 grams  Fluid:  > 1.6 L    Adam Yang A. Jimmye Norman, RD, LDN, CDE Pager: [phone] After hours Pager: [phone]

## 2018-02-24 NOTE — Evaluation (Signed)
Physical Therapy Evaluation Patient Details Name: Adam Yang MRN: [id number] DOB: 1945/12/25 Today's Date: 02/24/2018   History of Present Illness  Patient is a 72 y/o male admitted from [place] at [facility] due to HCAP with acute hypoxic respiratory failure. Of note, admitted on 7/18 for left frontoparietal bleed with resultant global aphasia, right hemiplegia, and right neglect. PMH significant for hypertension, diabetes, hyperlipidemia past radiation for nasopharynx cancer.    Clinical Impression  Patient admitted with the above listed diagnosis - from CIR - per chart review no longer a candidate for CIR level therapies. Patient today requiring Max A for bed level activities with mobility unable to be progressed due to needing +2 assist for patient/therpaist safety. PT to recommend SNF at discharge to continue to progress mobility as able. Will follow acutely to help facilitate d/c to SNF.     Follow Up Recommendations SNF;Supervision/Assistance - 24 hour    Equipment Recommendations  Other (comment)(TBD)    Recommendations for Other Services       Precautions / Restrictions Precautions Precautions: Fall Precaution Comments: R hemi, R inattention, psuher, global aphasia Restrictions Weight Bearing Restrictions: No      Mobility  Bed Mobility Overal bed mobility: Needs Assistance Bed Mobility: Rolling Rolling: Max assist         General bed mobility comments: Max A for rolling for peri-care; did not attempt further mobility due to need for +2 assist and patient safety concerns  Transfers                    Ambulation/Gait                Stairs            Wheelchair Mobility    Modified Rankin (Stroke Patients Only) Modified Rankin (Stroke Patients Only) Pre-Morbid Rankin Score: No symptoms Modified Rankin: Severe disability     Balance                                             Pertinent Vitals/Pain Pain Assessment:  Faces Faces Pain Scale: No hurt    Home Living Family/patient expects to be discharged to:: Private residence Living Arrangements: Spouse/significant other;Children;Other relatives Available Help at Discharge: Family;Available 24 hours/day Type of Home: House Home Access: Ramped entrance     Home Layout: Multi-level;Able to live on main level with bedroom/bathroom Home Equipment: Shower seat Additional Comments: home environment gleaned from chart review    Prior Function           Comments: from CIR - requires Max A      Hand Dominance   Dominant Hand: Left    Extremity/Trunk Assessment   Upper Extremity Assessment Upper Extremity Assessment: Defer to OT evaluation    Lower Extremity Assessment Lower Extremity Assessment: RLE deficits/detail;Generalized weakness RLE Deficits / Details: does not intentionally move R LE during session       Communication   Communication: Expressive difficulties  Cognition Arousal/Alertness: Awake/alert Behavior During Therapy: Restless Overall Cognitive Status: Impaired/Different from baseline                                        General Comments      Exercises     Assessment/Plan    PT Assessment Patient needs  continued PT services  PT Problem List Decreased strength;Decreased range of motion;Decreased activity tolerance;Decreased balance;Decreased mobility;Decreased coordination;Decreased cognition;Decreased knowledge of use of DME;Decreased safety awareness;Decreased knowledge of precautions;Impaired tone       PT Treatment Interventions DME instruction;Gait training;Functional mobility training;Therapeutic activities;Therapeutic exercise;Balance training;Neuromuscular re-education;Cognitive remediation;Patient/family education    PT Goals (Current goals can be found in the Care Plan section)  Acute Rehab PT Goals Patient Stated Goal: none stated PT Goal Formulation: Patient unable to participate  in goal setting Time For Goal Achievement: 03/10/18 Potential to Achieve Goals: Fair    Frequency Min 2X/week   Barriers to discharge        Co-evaluation               AM-PAC PT "6 Clicks" Daily Activity  Outcome Measure Difficulty turning over in bed (including adjusting bedclothes, sheets and blankets)?: Unable Difficulty moving from lying on back to sitting on the side of the bed? : Unable Difficulty sitting down on and standing up from a chair with arms (e.g., wheelchair, bedside commode, etc,.)?: Unable Help needed moving to and from a bed to chair (including a wheelchair)?: Total Help needed walking in hospital room?: Total Help needed climbing 3-5 steps with a railing? : Total 6 Click Score: 6    End of Session Equipment Utilized During Treatment: Oxygen Activity Tolerance: Patient tolerated treatment well Patient left: in bed;with call bell/phone within reach;with bed alarm set Nurse Communication: Mobility status PT Visit Diagnosis: Unsteadiness on feet (R26.81);Hemiplegia and hemiparesis Hemiplegia - Right/Left: Right Hemiplegia - dominant/non-dominant: Non-dominant Hemiplegia - caused by: Nontraumatic intracerebral hemorrhage    Time: 2376-2831 PT Time Calculation (min) (ACUTE ONLY): 21 min   Charges:   PT Evaluation $PT Eval Moderate Complexity: 1 Mod          Lanney Gins, PT, DPT 02/24/18 11:05 AM Pager: [phone]

## 2018-02-24 NOTE — Progress Notes (Signed)
Inpatient Diabetes Program Recommendations  AACE/ADA: New Consensus Statement on Inpatient Glycemic Control (2019)  Target Ranges:  Prepandial:   less than 140 mg/dL      Peak postprandial:   less than 180 mg/dL (1-2 hours)      Critically ill patients:  140 - 180 mg/dL   Results for DEVONN, GIAMPIETRO (MRN [id number]) as of 02/24/2018 09:31  Ref. Range 02/23/2018 15:05 02/24/2018 04:34  Glucose Latest Ref Range: 70 - 99 mg/dL 183 (H) 166 (H)   Review of Glycemic Control  Diabetes history: DM2 Outpatient Diabetes medications: Novolog 0-15 units Q4H Current orders for Inpatient glycemic control: None  Inpatient Diabetes Program Recommendations: Correction (SSI): Please consider ordering CBGs with Novolog 0-15 units Q4H.   Thanks, Barnie Alderman, RN, MSN, CDE Diabetes Coordinator Inpatient Diabetes Program [phone] (Team Pager from 8am to 5pm)

## 2018-02-24 NOTE — Progress Notes (Signed)
Inpatient Rehabilitation  Patient discharged from [facility] to acute 02/23/18.  Reviewed case with Dr. Letta Pate today and recommends that once patient is medically stable he will require post acute rehab with lower intensity therapy.  He is not a candidate for re-admission to IP Rehab.  Call if questions.    Carmelia Roller., CCC/SLP Admission Coordinator  [facility]  Cell [phone]

## 2018-02-24 NOTE — Progress Notes (Signed)
PROGRESS NOTE    Adam Yang  YDX:[id number] DOB: Oct 10, 1945 DOA: 02/23/2018 PCP: Lawerance Cruel, MD   Brief Narrative:  Per previous consult note Adam Yang is an 72 y.o. male with past medical history of hypertension, diabetes, hyperlipidemia past radiation for nasopharynx cancer who presented on 7/18 for stroke-like symptoms, found to have left frontoparietal bleed.  He was unable to obtain an MRI when his HR dropped into the 30s repeatedly.  He had severe HTN and was admitted to the ICU.  He had resultant global aphasia, right hemiplegia, and right neglect.  He improved well enough to be appropriate for transfer to CIR.  On 7/25, he was found to be unresponsive with a BP of 76/48.  Patient had been hypertensive to 188/100 and was given 20 mg hydralazine with resultant BP 68/39 and unresponsive 30 minutes later.  He was given IVF bolus and had improved BP to 149/73 with increased mental status.  He was transferred to CIR later that day.  On 8/10, he was found to have bibasilar PNA, R>L.  He was started on Cefepime.  He has had minimal progression in CIR and so is recommended to go to SNF based on review of rehab notes.   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  He had some episodes where he would go outside and feel presyncopal.  His BUN/Creatinine levels were elevated, and he was sent to nephrology in April - but there was no good answer for what was wrong with the kidneys.  Has been on HTN medications for about 20 years.  He had been off Norvasc but it was added back over the last year.  He was also taking fenofibrate, fish oil but really no other medications.  He was not on ASA and had no h/o CAD.  7/18, he had a CVA.  He had a significant hemorrhage, not on anticoagulation.  He was treated in the ICU here and treated with permissive HTN.  He was able to try to talk and communicate.  He was transferred out of ICU to the stroke unit.   They felt that he would be a good candidate for rehab.  He was interacting and recognizing people but unable to verbalize.  He did have a dense right hemiparesis.  He was transferred to rehab.  He seems to have been downhill since arriving in rehab.  He came over Thursday evening, 7/25.  The night before transfer, his BP dropped to 70/40 and yet he was still transferred; she is concerned that this might have been premature.  Friday, they introduced the therapy plan.  He fell out of bed on Friday. He was moving his legs and active, "typical stroke, he thought he could do it."  He got a telesitter at that time.  Since then, he has seemed weaker.  Last week, SW told them this unit is for intense rehab, may need to think about long-range plans.  His family was considering bringing him home.  He seemed to be really trying with PT/OT.  He had an NG tube and he was getting accuchecks q4h - he previously had a PEG tube during his remote cancer and his wife was concerned about going that route again.  She requested that he be changed to Glucerna and not continue SSI.  Radiology came over the weekend to obtain her consent to transition him to PEG tube. His wife was concered about this and she wanted to  wait to proceed with the tube.  His wife requested that he not go for a PEG yet.  He went on Monday and got the PEG despite her concerns - although the wife did sign the consent on Saturday.  His wife is now concerned that he has a PEG; getting 3 hours of therapy services; pain due to the PEG - so he was not meeting goals of therapy and that "all these setbacks" may be the reason.  Last week, Monday/Tuesday he was doing well with PT.  Wednesday, he was excessively fatigued and had difficulty participating in therapy.  BP 90/50.  His wife was then concerned about excessive medication for hypertension.  His wife was then concerned that he was started on new medications not discussed with her ("Ritalin, Baclofen, Vasopressin").   She had not spoken with Dr. Letta Pate and so was again frustrated.  His wife wanted to [name] all of his medications since he had not previously been on many medications at home.  Friday evening, his wife noticed that he felt warm; the weekend nurse was concerned and sent him for a CXR.  He was started on antibiotics for possible aspiration and was started on respiratory toilet.  Monday, his wife was concerned that she still had not met Dr. Letta Pate.  She felt that the antibiotics were working.  She wants to ensure that he has mouth care q2h due to h/o head/neck cancer.  SW last week asked for her top 3 choices for SNF last week; they were expecting to stay in rehab until 8/21.  Today, she was informed that [facility] will only pay through 8/16 and then will need to go to another facility.  She feels like he is wasting his rehab days while he has an acute illness.  His wife reached out to the ENT and Dr. Harrington Challenger; she thought maybe he needed to go to [facility] or to the acute side.  SW today told her that maybe he isn't stable for transport to a SNF at this time related to his PNA.  She just wants the best for him and she just hates that he's going through this.  She is "realistic in knowing that he hasn't made any gains with PT, but he has had every set back possible".     Assessment & Plan:   Active Problems:   HCAP (healthcare-associated pneumonia)   HCAP  Acute Hypoxic Respiratory Failure:  CXR from 8/10 with bibasilar pneumonia worse on R.  Repeat CXR 8/15 with worsening of bilateral R greater than L lower lung pneumonia.  Admitted to acute care from rehab on 8/15 for worsening pneumonia.  Cefepime 8/10 - present Will add flagyl for possible aspiration Follow repeat MRSA PCR (negative - will hold off on MRSA coverage at this time) Follow urine strep.  Sputum ordered, but unlikely to be able to obtain. No blood cultures obtained.  Will hold off on obtaining cx at this time unless pt develops fever or WBC  improved today. 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  Now NPO after stroke.  S/p PEG placement.   Glucerna 1.2 cal at 75 ml/hr  200 cc q 3 hrs free water Prostat 30 ml daily Dietician c/s for feeds  Leukocytosis:  Improved today. 2/2 above, continue to follow, may need to broaden abx.  Consider culturing if change in status or worsening.  Anemia:  H/H stable, negative hemoccult, follow. Stable.  CKD III: unclear baseline, currently better than recent baseline, follow.  Apparently follows with renal as outpatinet.  Nasopharyngeal cancer s/p radiation: follows with ENT  HLD: finofibrate, fish oil, lipitor  T2DM: SSI  Hypertension: continue norvasc 10.  HTNsive emergency on initial presentation. ** avoid IV hydralazine as pt with hypotension with this  Goals of Care:  Discussed with daughter and mother over phone on 8/15.  Recommended palliative care consult to discuss goals of care.  Discussed poor prognosis with his recent hemorrhagic stroke and now likely aspiration pneumonia.  Family agreeable to discuss with palliative care.  See above for many family concerns regarding his course up until this point.   DVT prophylaxis: SCD's due to bleeding risk with ICH Code Status: DNR Family Communication: daughter and wife on 8/15 Disposition Plan: pending   Consultants:   Taking over from Rehab  Procedures:   PEG on 7/29 Study Conclusions  - Left ventricle: The cavity size was normal. Systolic function was   vigorous. The  estimated ejection fraction was in the range of 65%   to 70%. Wall motion was normal; there were no regional wall   motion abnormalities. Left ventricular diastolic function   parameters were normal. - Aortic valve: Trileaflet; moderately thickened, moderately   calcified leaflets. Transvalvular velocity was within the normal   range. There was no stenosis. There was no regurgitation. - Mitral valve: There was no regurgitation. - Right ventricle: The cavity size was normal. Wall thickness was   normal. Systolic function was normal. - Tricuspid valve: There was no regurgitation. - Pulmonary arteries: Systolic pressure could not be accurately   estimated.  Impressions:  - Limited study quality, hyperdynamic LVEF, aortic valve leaflets   are calcified, no stenosis, no source of embolism was seen.  Antimicrobials:  Anti-infectives (From admission, onward)   Start     Dose/Rate Route Frequency Ordered Stop   02/24/18 1000  ceFEPIme (MAXIPIME) 2 g in sodium chloride 0.9 % 100 mL IVPB     2 g 200 mL/hr over 30 Minutes Intravenous Every 24 hours 02/23/18 2243     02/23/18 2245  metroNIDAZOLE (FLAGYL) 50 mg/ml oral suspension 500 mg     500 mg Per Tube 3 times daily 02/23/18 2234       Subjective: Moaning, looks at me and mumbles incoherently when asked questions, but unable to understand him. Not following commands.  Objective: Vitals:   02/24/18 0002 02/24/18 0425 02/24/18 0749 02/24/18 0752  BP: (!) 154/72 (!) 151/81  (!) 131/57  Pulse: 83 88 87 91  Resp: (!) 22 20 (!) 21 20  Temp: 98.2 F (36.8 C) 98.6 F (37 C)  98 F (36.7 C)  TempSrc: Oral Oral  Oral  SpO2: 96% 94% 96% 95%    Intake/Output Summary (Last 24 hours) at 02/24/2018 5638 Last data filed at 02/23/2018 2359 Gross per 24 hour  Intake 450 ml  Output -  Net 450 ml   There were no vitals filed for this visit.  Examination:  General: No acute distress. Cardiovascular: Heart sounds show a regular rate, and  rhythm. Lungs: Coarse breath sounds throughout.  No increased wob.  On 3.5 L [place]. Abdomen: Soft, nontender, nondistended.  Peg tube in place. Neurological: Attends to voice.  Mumbles incoherently.  R sided weakness. Skin: Warm and dry. No rashes or lesions. Extremities: No clubbing or cyanosis. No edema.    Data Reviewed: I have personally reviewed following labs and imaging studies  CBC: Recent Labs  Lab 02/18/18 0922 02/20/18 0600 02/23/18 1505 02/24/18 0434  WBC 10.1 7.6 11.4* 9.5  HGB 10.9* 9.5* 9.5* 10.4*  HCT 33.7* 29.7* 30.4* 33.7*  MCV 79.9 80.7 81.9 82.2  PLT 331 301 349 300   Basic Metabolic Panel: Recent Labs  Lab 02/18/18 0922 02/20/18 0600 02/23/18 1505 02/24/18 0434  NA 136 135 137 138  K 4.1 4.5 4.6 4.3  CL 96* 98 100 101  CO2 '29 26 27 29  '$ GLUCOSE 124* 148* 183* 166*  BUN 45* 48* 52* 49*  CREATININE 1.91* 1.77* 1.62* 1.59*  CALCIUM 9.2 9.0 9.2 9.1  MG  --   --   --  2.4   GFR: Estimated Creatinine Clearance: 36.8 mL/min (A) (by C-G formula based on SCr of 1.59 mg/dL (H)). Liver Function Tests: Recent Labs  Lab 02/18/18 0922 02/24/18 0434  AST 24 35  ALT 21 37  ALKPHOS 51 76  BILITOT 0.5 0.3  PROT 7.3 6.6  ALBUMIN 2.4* 2.1*   No results for input(s): LIPASE, AMYLASE in the last 168 hours. No results for input(s): AMMONIA in the last 168 hours. Coagulation Profile: No results for input(s): INR, PROTIME in the last 168 hours. Cardiac Enzymes: No results for input(s): CKTOTAL, CKMB, CKMBINDEX, TROPONINI in the last 168 hours. BNP (last 3 results) No results for input(s): PROBNP in the last 8760 hours. HbA1C: No results for input(s): HGBA1C in the last 72 hours. CBG: No results for input(s): GLUCAP in the last 168 hours. Lipid Profile: No results for input(s): CHOL, HDL, LDLCALC, TRIG, CHOLHDL, LDLDIRECT in the last 72 hours. Thyroid Function Tests: No results for input(s): TSH, T4TOTAL, FREET4, T3FREE, THYROIDAB in the last 72  hours. Anemia Panel: No results for input(s): VITAMINB12, FOLATE, FERRITIN, TIBC, IRON, RETICCTPCT in the last 72 hours. Sepsis Labs: Recent Labs  Lab 02/24/18 0434  PROCALCITON 0.29    Recent Results (from the past 240 hour(s))  MRSA PCR Screening     Status: None   Collection Time: 02/23/18  5:59 PM  Result Value Ref Range Status   MRSA by PCR NEGATIVE NEGATIVE Final    Comment:        The GeneXpert MRSA Assay (FDA approved for NASAL specimens only), is one component of a comprehensive MRSA colonization surveillance program. It is not intended to diagnose MRSA infection nor to guide or monitor treatment for MRSA infections. Performed at [facility], [place] [address] [place], [place] [address]          Radiology Studies: Dg Chest Port 1 View  Result Date: 02/23/2018 CLINICAL DATA:  Pneumonia EXAM: PORTABLE CHEST 1 VIEW COMPARISON:  02/18/2018, PET CT 06/17/2015 FINDINGS: Worsened airspace disease at the left base and right mid to lower lung. No pleural effusion. Stable cardiomediastinal silhouette. No pneumothorax. IMPRESSION: Worsening of bilateral right greater than left lower lung pneumonia. Electronically Signed   By: Donavan Foil M.D.   On: 02/23/2018 16:43        Scheduled Meds: . albuterol  2.5 mg Nebulization TID  . amLODipine  10 mg Oral Daily  . atorvastatin  20 mg Oral q1800  .  chlorhexidine  15 mL Mouth Rinse BID  . diclofenac sodium  2 g Topical QID  . feeding supplement (PRO-STAT SUGAR FREE 64)  30 mL Per Tube Daily  . fenofibrate  160 mg Oral Daily  . free water  200 mL Per Tube Q3H  . mouth rinse  15 mL Mouth Rinse Q2H  . methylphenidate  5 mg Per Tube BID WC  . metoprolol tartrate  25 mg Per Tube BID  . metroNIDAZOLE  500 mg Per Tube TID  . multivitamin  15 mL Per Tube Daily   Continuous Infusions: . ceFEPime (MAXIPIME) IV    . feeding supplement (GLUCERNA 1.2 CAL) 1,000 mL (02/24/18 0837)     LOS: 1 day    Time spent: over 30  min MDM high with AHRF, aspiration pneumonia, multiple medical issues    Fayrene Helper, MD [facility] Pager [phone]  If 7PM-7AM, please contact night-coverage [email or web address] Password [facility] 02/24/2018, 8:52 AM

## 2018-02-24 NOTE — Progress Notes (Signed)
Deep suctioned the PT after CPT

## 2018-02-25 ENCOUNTER — Inpatient Hospital Stay (HOSPITAL_COMMUNITY): Payer: Medicare Other

## 2018-02-25 DIAGNOSIS — Z7189 Other specified counseling: Secondary | ICD-10-CM

## 2018-02-25 DIAGNOSIS — E440 Moderate protein-calorie malnutrition: Secondary | ICD-10-CM

## 2018-02-25 DIAGNOSIS — Z515 Encounter for palliative care: Secondary | ICD-10-CM

## 2018-02-25 DIAGNOSIS — R6889 Other general symptoms and signs: Secondary | ICD-10-CM

## 2018-02-25 LAB — GLUCOSE, CAPILLARY
GLUCOSE-CAPILLARY: 115 mg/dL — AB (ref 70–99)
Glucose-Capillary: 127 mg/dL — ABNORMAL HIGH (ref 70–99)
Glucose-Capillary: 132 mg/dL — ABNORMAL HIGH (ref 70–99)
Glucose-Capillary: 146 mg/dL — ABNORMAL HIGH (ref 70–99)
Glucose-Capillary: 148 mg/dL — ABNORMAL HIGH (ref 70–99)
Glucose-Capillary: 188 mg/dL — ABNORMAL HIGH (ref 70–99)
Glucose-Capillary: 202 mg/dL — ABNORMAL HIGH (ref 70–99)

## 2018-02-25 LAB — COMPREHENSIVE METABOLIC PANEL
ALBUMIN: 2 g/dL — AB (ref 3.5–5.0)
ALK PHOS: 66 U/L (ref 38–126)
ALT: 38 U/L (ref 0–44)
AST: 35 U/L (ref 15–41)
Anion gap: 12 (ref 5–15)
BUN: 46 mg/dL — ABNORMAL HIGH (ref 8–23)
CO2: 27 mmol/L (ref 22–32)
CREATININE: 1.54 mg/dL — AB (ref 0.61–1.24)
Calcium: 9.1 mg/dL (ref 8.9–10.3)
Chloride: 99 mmol/L (ref 98–111)
GFR calc Af Amer: 51 mL/min — ABNORMAL LOW (ref >60)
GFR calc non Af Amer: 44 mL/min — ABNORMAL LOW (ref >60)
GLUCOSE: 151 mg/dL — AB (ref 70–99)
Potassium: 4.3 mmol/L (ref 3.5–5.1)
SODIUM: 138 mmol/L (ref 135–145)
Total Bilirubin: 0.3 mg/dL (ref 0.3–1.2)
Total Protein: 6.6 g/dL (ref 6.5–8.1)

## 2018-02-25 LAB — CBC
HCT: 31.5 % — ABNORMAL LOW (ref 39.0–52.0)
HEMOGLOBIN: 9.7 g/dL — AB (ref 13.0–17.0)
MCH: 24.9 pg — ABNORMAL LOW (ref 26.0–34.0)
MCHC: 30.8 g/dL (ref 30.0–36.0)
MCV: 80.8 fL (ref 78.0–100.0)
Platelets: 386 10*3/uL (ref 150–400)
RBC: 3.9 MIL/uL — AB (ref 4.22–5.81)
RDW: 15.1 % (ref 11.5–15.5)
WBC: 8.3 10*3/uL (ref 4.0–10.5)

## 2018-02-25 LAB — PROCALCITONIN: PROCALCITONIN: 0.21 ng/mL

## 2018-02-25 LAB — MAGNESIUM: Magnesium: 2 mg/dL (ref 1.7–2.4)

## 2018-02-25 MED ORDER — GLUCERNA 1.2 CAL PO LIQD
1000.0000 mL | ORAL | Status: DC
  Filled 2018-02-25: qty 1000

## 2018-02-25 MED ORDER — JEVITY 1.5 CAL/FIBER PO LIQD
1000.0000 mL | ORAL | Status: DC
  Administered 2018-02-25 – 2018-03-01 (×4): 1000 mL
  Filled 2018-02-25 (×8): qty 1000

## 2018-02-25 MED ORDER — GLYCOPYRROLATE 0.2 MG/ML IJ SOLN: 0.2000 mg | INTRAMUSCULAR | Status: DC | PRN

## 2018-02-25 MED ORDER — FREE WATER
100.0000 mL | Status: DC
  Administered 2018-02-25 – 2018-03-02 (×37): 100 mL

## 2018-02-25 MED ORDER — PRO-STAT SUGAR FREE PO LIQD
30.0000 mL | Freq: Two times a day (BID) | ORAL | Status: DC
  Administered 2018-02-25 – 2018-03-02 (×10): 30 mL
  Filled 2018-02-25 (×10): qty 30

## 2018-02-25 NOTE — Consult Note (Signed)
Consultation Note Date: 02/25/2018   Patient Name: Adam Yang  DOB: [birth year]  MRN: [id number]  Age / Sex: 72 y.o., male  PCP: Lawerance Cruel, MD Referring Physician: Elodia Florence., *  Reason for Consultation: Establishing goals of care and Psychosocial/spiritual support  HPI/Patient Profile: 72 y.o. 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   Consult ordered for goals of care.   Clinical Assessment and Goals of Care: Patient seen, chart reviewed.  Spoke to patient's spouse, Arrow Tomko, via phone.  She is unable to meet in person until Monday, however ,we were able to engage in life review, current clinical status, as well as hopes, goals of care in the setting of new stroke, worsening dysphasia, pneumonia  Tye Maryland is a Marine scientist and has  good healthcare literacy.  She does state that her husband is "a Nurse, adult and he is not through yet".  She does candidly share that she is not receptive to talking about hospice at this point but does recognize that this service would be available to her in the future.  She and her daughter verbalized increased hope after seeing patient on 02/24/2018 where he was more alert.  She shares with me that he has multiple family members coming into town over the weekend.  Juliann Pulse states "he can beat this pneumonia, I have no doubt".  She feels as though he just needs more time.  She verbalizes being in a hopeful, watchful waiting mode to see if he can rebound.  She seems to recognize that we are looking towards a new baseline since this stroke and now pneumonia, worsening dysphagia and  risk of aspiration PNA..  She verbalizes placing this in God's hands and that her husband relies on his spiritual practice as a source of strength.  As a nurse, Juliann Pulse was interested in clinical data which I did share with her.  Patient's spouse would be his healthcare proxy in the event he were unable to speak for himself. Cathy Mccorvey at [phone]   SUMMARY OF RECOMMENDATIONS   Confirmed DNR DNI.  This is a care limit that is  set for now Family in a watchful waiting mode over the next 48 hours to see if he can improve with antibiotics. Repeat goals of care meeting scheduled with Mrs. Giorgio and her daughter Summer, for 02/27/2018 at 2 PM Code Status/Advance Care Planning:  DNR    Symptom Management:   Secretions: Given patient's history of nasopharyngeal cancer status post radiation treatment, secretions have been an issue at baseline.  Mrs. Weare shares at home , he would gargle with Coca-Cola which would enable him to mobilize secretions.  Additionally, Robinul 0.2 every 4 hours as needed could be beneficial  Palliative Prophylaxis:   Aspiration, Bowel Regimen, Delirium Protocol, Eye Care, Frequent Pain  Assessment, Oral Care and Turn Reposition  Additional Recommendations (Limitations, Scope, Preferences):  Full Scope Treatment except for DNR/DNI  Psycho-social/Spiritual:   Desire for further Chaplaincy support:no   Prognosis:   Difficult to determine, but I would not be surprised if patient had a prognosis of weeks to months given his high risk of aspiration pneumonia secondary to severe dysphasia in the context of late effects of radiation treatment with nasopharyngeal cancer; protein calorie malnutrition with an albumin of 2.0; worsening pneumonia despite antibiotic treatment  Discharge Planning: To Be Determined      Primary Diagnoses: Present on Admission: . HCAP (healthcare-associated pneumonia)   I have reviewed the medical record, interviewed the patient and  family, and examined the patient. The following aspects are pertinent.  Past Medical History:  Diagnosis Date  . Anxiety    Claustrophobia  . Arthritis   . Cancer (HCC)    nasopharynx  . Chronic cough    EXCESS MUCUS BUILDUP SINCE ORAL SURGERY FROM CANCER   . Chronic kidney disease    Renal Insuffiency , Creatinine has been in the 2- 3 range, 11/27/14 Creatine 1.4  . Diabetes mellitus without complication ([place])    Type II  . Gout   . History of radiation therapy 12/23/2014- 02/10/2015   Nasopharynx / Neck Adenopathy  . Hyperlipidemia   . Hypertension   . Inguinal hernia   . Right inguinal hernia 08/23/2017   Social History   Socioeconomic History  . Marital status: Married    Spouse name: Not on file  . Number of children: 1  . Years of education: Not on file  . Highest education level: Not on file  Occupational History  . Not on file  Social Needs  . Financial resource strain: Not on file  . Food insecurity:    Worry: Not on file    Inability: Not on file  . Transportation needs:    Medical: Not on file    Non-medical: Not on file  Tobacco Use  . Smoking status: Former Smoker    Packs/day: 1.00    Years: 25.00    Pack years: 25.00    Last attempt to quit: 07/12/1998    Years since quitting: 19.6  . Smokeless tobacco: Never Used  Substance and Sexual Activity  . Alcohol use: No  . Drug use: No  . Sexual activity: Not Currently  Lifestyle  . Physical activity:    Days per week: Not on file    Minutes per session: Not on file  . Stress: Not on file  Relationships  . Social connections:    Talks on phone: Not on file    Gets together: Not on file    Attends religious service: Not on file    Active member of club or organization: Not on file    Attends meetings of clubs or organizations: Not on file    Relationship status: Not on file  Other Topics Concern  . Not on file  Social History Narrative   Patient is married and had 1 child. 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   Family History  Problem Relation Age of Onset  . Cancer Mother        throat ca  . Cancer Father        lung ca  . Cancer Brother        throat ca  . Heart block Brother  Scheduled Meds: . amLODipine  10 mg Oral Daily  . atorvastatin  20 mg Oral q1800  . chlorhexidine  15 mL Mouth Rinse BID  . diclofenac sodium  2 g Topical QID  . feeding supplement (PRO-STAT SUGAR FREE 64)  30 mL Per Tube Daily  . fenofibrate  160 mg Oral Daily  . free water  200 mL Per Tube Q3H  . insulin aspart  0-9 Units Subcutaneous Q4H  . mouth rinse  15 mL Mouth Rinse Q2H  . methylphenidate  5 mg Per Tube BID WC  . metoprolol tartrate  25 mg Per Tube BID  . metroNIDAZOLE  500 mg Per Tube TID  . multivitamin  15 mL Per Tube Daily   Continuous Infusions: . ceFEPime (MAXIPIME) IV 2 g (02/25/18 1140)  . feeding supplement (GLUCERNA 1.2 CAL) 75 mL/hr at 02/25/18 0300   PRN Meds:.acetaminophen (TYLENOL) oral liquid 160 mg/5 mL, artificial tears, tiZANidine Medications Prior to Admission:  Prior to Admission medications   Medication Sig Start Date End Date Taking? Authorizing Provider  Amino Acids-Protein Hydrolys (FEEDING SUPPLEMENT, PRO-STAT SUGAR FREE 64,) LIQD Place 30 mLs into feeding tube daily. 02/03/18   Donzetta Starch, NP  amLODipine (NORVASC) 10 MG tablet Take 1 tablet (10 mg total) by mouth daily. 02/03/18   Donzetta Starch, NP  artificial tears (LACRILUBE) OINT ophthalmic ointment Place into both eyes every 3 (three) hours as needed for dry eyes. 02/02/18   Donzetta Starch, NP  atorvastatin (LIPITOR) 20 MG tablet Take 1 tablet (20 mg total) by mouth daily at 6 PM. 02/02/18   Donzetta Starch, NP  chlorhexidine (PERIDEX) 0.12 % solution 15 mLs by Mouth Rinse route 2 (two) times daily. 02/02/18   Donzetta Starch, NP  fenofibrate 160 MG tablet Take 160 mg by mouth daily.    [provider]  insulin aspart  (NOVOLOG) 100 UNIT/ML injection Inject 0-15 Units into the skin every 4 (four) hours. 02/02/18   Donzetta Starch, NP  lisinopril (PRINIVIL,ZESTRIL) 20 MG tablet Take 1 tablet (20 mg total) by mouth daily. 02/02/18   Donzetta Starch, NP  mouth rinse LIQD solution 15 mLs by Mouth Rinse route every 2 (two) hours. 02/02/18   Donzetta Starch, NP  Multiple Vitamin (MULTIVITAMIN) LIQD Place 15 mLs into feeding tube daily. 02/03/18   Donzetta Starch, NP  Nutritional Supplements (FEEDING SUPPLEMENT, JEVITY 1.2 CAL,) LIQD Place 1,000 mLs into feeding tube continuous. 02/02/18   Donzetta Starch, NP  nystatin (MYCOSTATIN) 100000 UNIT/ML suspension Use as directed 5 mLs (500,000 Units total) in the mouth or throat 4 (four) times daily. 02/02/18   Donzetta Starch, NP  polyethylene glycol (MIRALAX / GLYCOLAX) packet Take 17 g by mouth daily. 02/03/18   Donzetta Starch, NP  senna-docusate (SENOKOT-S) 8.6-50 MG tablet Take 1 tablet by mouth 2 (two) times daily. 02/02/18   Donzetta Starch, NP  sodium chloride 0.9 % infusion Inject 500 mLs into the vein continuous. IV at 67mL/hr 02/02/18   Donzetta Starch, NP  tiZANidine (ZANAFLEX) 4 MG tablet Take 4 mg by mouth at bedtime. 12/12/17   [provider]   No Known Allergies Review of Systems  Unable to perform ROS: Patient nonverbal    Physical Exam  Constitutional:  Acutely ill-appearing elderly man; copious upper airway secretions, lethargic  Cardiovascular:  Edema  Pulmonary/Chest:  Copious upper airway secretions  Neurological:  Lethargic   Skin: Skin is warm and dry.  There is pallor.  Psychiatric:  No overt agitation otherwise unable to test  Nursing note and vitals reviewed.   Vital Signs: BP (!) 141/89 (BP Location: Right Arm)   Pulse 69   Temp 98.2 F (36.8 C) (Oral)   Resp 20   Ht 5\' 9"  (1.753 m) Comment: 02/02/18  Wt 60.2 kg Comment: 02/22/18  SpO2 95%   BMI 19.60 kg/m  Pain Scale: 0-10   Pain Score: Asleep   SpO2: SpO2: 95 % O2 Device:SpO2:  95 % O2 Flow Rate: .O2 Flow Rate (L/min): 4 L/min  IO: Intake/output summary:   Intake/Output Summary (Last 24 hours) at 02/25/2018 1240 Last data filed at 02/25/2018 0600 Gross per 24 hour  Intake 2926.25 ml  Output -  Net 2926.25 ml    LBM: Last BM Date: 02/23/18 Baseline Weight: Weight: 60.2 kg(02/22/18) Most recent weight: Weight: 60.2 kg(02/22/18)     Palliative Assessment/Data:   Flowsheet Rows     Most Recent Value  Intake Tab  Referral Department  Hospitalist  Unit at Time of Referral  Med/Surg Unit  Palliative Care Primary Diagnosis  Sepsis/Infectious Disease  Date Notified  02/24/18  Palliative Care Type  New Palliative care  Reason Not Seen  Discharged before seen  Reason for referral  Clarify Goals of Care, Psychosocial or Spiritual support  Date of Admission  02/23/18  Date first seen by Palliative Care  02/25/18  # of days Palliative referral response time  1 Day(s)  # of days IP prior to Palliative referral  1  Clinical Assessment  Palliative Performance Scale Score  40%  Pain Max last 24 hours  Not able to report  Pain Min Last 24 hours  Not able to report  Dyspnea Max Last 24 Hours  Not able to report  Dyspnea Min Last 24 hours  Not able to report  Nausea Max Last 24 Hours  Not able to report  Nausea Min Last 24 Hours  Not able to report  Anxiety Max Last 24 Hours  Not able to report  Anxiety Min Last 24 Hours  Not able to report  Other Max Last 24 Hours  Not able to report  Psychosocial & Spiritual Assessment  Palliative Care Outcomes  Patient/Family meeting held?  Yes  Who was at the meeting?  wife via phone  Palliative Care Outcomes  Provided psychosocial or spiritual support, Clarified goals of care  Patient/Family wishes: Interventions discontinued/not started   Mechanical Ventilation  Palliative Care follow-up planned  Yes, Facility      Time In: 1000 Time Out: 1050 Time Total: 50 min Greater than 50%  of this time was spent counseling and  coordinating care related to the above assessment and plan. Staffed with Dr. Florene Glen  Signed by: Dory Horn, NP   Please contact Palliative Medicine Team phone at [phone] for questions and concerns.  For individual provider: See Shea Evans

## 2018-02-25 NOTE — Evaluation (Signed)
Occupational Therapy Re-Evaluation Patient Details Name: Adam Yang MRN: [id number] DOB: Jul 21, 1945 Today's Date: 02/25/2018    History of Present Illness Patient is a 72 y/o male admitted from [place] at [facility] due to HCAP with acute hypoxic respiratory failure. Of note, admitted on 7/18 for left frontoparietal bleed with resultant global aphasia, right hemiplegia, and right neglect. PMH significant for hypertension, diabetes, hyperlipidemia past radiation for nasopharynx cancer.   Clinical Impression   Pt re-admitted from CIR where he was working on rolling in bed with max A for dressing. Pt able to assist with rolling to the right using LUE to assist on bed rail for peri care and linen change. Due to cognitive/communication deficits and no family present did not attempt OOB. At CIR they were using TIS wheelchair and feel that the recliner puts Pt at higher risk for falls/sliding out of the chair. Pt sounded VERY wet (RN notified) attempted oral suction- but no improvement as it sounds like it is in his throat. OT will continue to follow acutely and recommend Palliative Consult and SNF level follow up.     Follow Up Recommendations  SNF;Supervision/Assistance - 24 hour    Equipment Recommendations  Wheelchair (measurements OT);Wheelchair cushion (measurements OT);3 in 1 bedside commode(tilt in space WC for safety)    Recommendations for Other Services Other (comment)(Palliative Medicine)     Precautions / Restrictions Precautions Precautions: Fall Precaution Comments: R hemi, R inattention, pusher, global aphasia Restrictions Weight Bearing Restrictions: No      Mobility Bed Mobility Overal bed mobility: Needs Assistance Bed Mobility: Rolling Rolling: Max assist         General bed mobility comments: Max A for rolling for peri-care; Pt does assist with rolling to the right reachig across with LUE  Transfers                 General transfer comment: did not attempt  further mobility due to need for +2 assist and patient safety concerns    Balance                                           ADL either performed or assessed with clinical judgement   ADL Overall ADL's : Needs assistance/impaired                                       General ADL Comments: total A this session for ADL     Vision         Perception     Praxis      Pertinent Vitals/Pain Faces Pain Scale: No hurt     Hand Dominance Left   Extremity/Trunk Assessment Upper Extremity Assessment Upper Extremity Assessment: RUE deficits/detail RUE Deficits / Details: flaccid brunstrom I RUE Coordination: decreased fine motor;decreased gross motor   Lower Extremity Assessment Lower Extremity Assessment: Defer to PT evaluation RLE Deficits / Details: does not intentionally move R LE during session       Communication Communication Communication: Expressive difficulties(nods and vocalized "yes, no" - unsure of accuracy)   Cognition Arousal/Alertness: Awake/alert Behavior During Therapy: Restless Overall Cognitive Status: Difficult to assess  General Comments: pt grossly answering questions with "no" or "yes" and inconsistent    General Comments       Exercises     Shoulder Instructions      Home Living Family/patient expects to be discharged to:: Private residence Living Arrangements: Spouse/significant other;Children;Other relatives Available Help at Discharge: Family;Available 24 hours/day Type of Home: House Home Access: Ramped entrance     Home Layout: Multi-level;Able to live on main level with bedroom/bathroom Alternate Level Stairs-Number of Steps: flight   Bathroom Shower/Tub: Teacher, early years/pre: Standard     Home Equipment: Building services engineer Comments: home environment gleaned from chart review  Lives With: Spouse;Daughter    Prior  Functioning/Environment          Comments: from [employer] - requires Max A         OT Problem List: Decreased strength;Decreased range of motion;Decreased activity tolerance;Impaired balance (sitting and/or standing);Decreased safety awareness;Decreased knowledge of use of DME or AE;Decreased knowledge of precautions      OT Treatment/Interventions: Self-care/ADL training;Therapeutic exercise;DME and/or AE instruction;Therapeutic activities;Balance training;Patient/family education;Cognitive remediation/compensation    OT Goals(Current goals can be found in the care plan section) Acute Rehab OT Goals Patient Stated Goal: none stated OT Goal Formulation: Patient unable to participate in goal setting ADL Goals Pt Will Perform Upper Body Dressing: with max assist;with caregiver independent in assisting;bed level Pt Will Perform Lower Body Dressing: with max assist;with caregiver independent in assisting;bed level Additional ADL Goal #1: Pt will assist with rolling in the bed with max A for bed level ADL  OT Frequency: Min 2X/week   Barriers to D/C:            Co-evaluation              AM-PAC PT "6 Clicks" Daily Activity     Outcome Measure Help from another person eating meals?: Total Help from another person taking care of personal grooming?: Total Help from another person toileting, which includes using toliet, bedpan, or urinal?: Total Help from another person bathing (including washing, rinsing, drying)?: Total Help from another person to put on and taking off regular upper body clothing?: Total Help from another person to put on and taking off regular lower body clothing?: Total 6 Click Score: 6   End of Session Nurse Communication: Mobility status;Other (comment)(sounds very wet (attempted oral suction - no relief))  Activity Tolerance: Patient limited by lethargy Patient left: in bed;with call bell/phone within reach;with bed alarm set;with SCD's reapplied  OT Visit  Diagnosis: Unsteadiness on feet (R26.81);Muscle weakness (generalized) (M62.81);Other symptoms and signs involving cognitive function;Cognitive communication deficit (R41.841);Hemiplegia and hemiparesis Hemiplegia - Right/Left: Right Hemiplegia - dominant/non-dominant: Non-Dominant                Time: 5732-2025 OT Time Calculation (min): 20 min Charges:  OT General Charges $OT Visit: 1 Visit OT Evaluation $OT Re-eval: 1 Re-eval  Hulda Humphrey OTR/L [place] 02/25/2018, 10:10 AM

## 2018-02-25 NOTE — H&P (Signed)
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 He was unable to obtain an MRI when his HR dropped into the 30s repeatedly. He had severe HTN and was admitted to the ICU. He had resultant global aphasia, right hemiplegia, and right neglect. He improved well enough to be appropriate for transfer to CIR. On 7/25, he was found to be unresponsive with a BP of 76/48. Patient had been hypertensive to 188/100 and was given 20 mg hydralazine with resultant BP 68/39 and unresponsive 30 minutes later. He was given IVF bolus and had improved BP to 149/73 with increased mental status. He was transferred to CIR later that day. On 8/10, he was found to have bibasilar PNA, R>L. He was started on Cefepime. He has had minimal progression in CIR and so is recommended to go to SNF based on review of rehab notes.   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 He had some episodes where he would go outside and feel presyncopal.HisBUN/Creatinine levelswereelevated, and he wassent to nephrology in April - but there was no good answer for what was wrong with the kidneys. Has been on HTN medications for about 20 years. He had been off Norvasc but it was added back over the last year. He was also taking fenofibrate, fish oil but really no other medications. He was not on ASA and hadno h/o CAD. 7/18, he had a CVA. He had a significant hemorrhage, not on anticoagulation. He was treated in the ICU  here and treated with permissive HTN. He was able to try to talk and communicate. He was transferred out of ICU to the stroke unit. They felt that he would be a good candidate for rehab. He was interacting and recognizing people but unable to verbalize. He did have a dense right hemiparesis. He was transferred to rehab. He seems to have been downhill since arriving in rehab. He came over Thursday evening, 7/25. The night before transfer, his BP dropped to 70/40 and yet he was still transferred; she is concerned that this might have been premature.Friday, they introducedthetherapy plan. He fell out of bed on Friday. He was moving his legs and active, "typical stroke, hethought he could do it." He got a telesitter at that time. Since then, he has seemed weaker. Last week, SW told them this unit is for intense rehab, may need to think about long-range plans. His family was considering bringing him home. He seemed to be really trying with PT/OT. He had an NG tube and he was getting accuchecks q4h - he previously had a PEG tube during his remote cancerand his wife was concerned about going that route again. She requested that he be changed to Glucerna and not continue SSI. Radiology came over the weekend to obtain her consentto transition him to PEG tube. His wife was concered about this and she wantedto wait to proceed with the tube.His wife requested that he not go for a PEG yet. He went on Monday and got the PEG despite her concerns - although the wife did sign the consent on  Saturday. His wife is now concerned that he has a PEG; getting 3 hours of therapy services; pain due to the PEG - so he was not meeting goals of therapy and that "all these setbacks" may be the reason. Last week, Monday/Tuesday he was doing well with PT. Wednesday, he was excessively fatigued and had difficulty participating in therapy. BP 90/50. His wife was then concerned about excessive medication for  hypertension. His wife was then concerned that he was started on new medications not discussed with her ("Ritalin, Baclofen, Vasopressin"). She had not spoken with Dr. Letta Pate and so was again frustrated. His wife wanted to [name] all of his medications since he had not previously been on many medications at home. Friday evening, his wife noticed that he felt warm; the weekend nurse was concerned and sent him for a CXR. He was started on antibiotics for possible aspiration and was started on respiratory toilet. Monday, his wife was concerned that she still had not met Dr. Letta Pate. She felt that the antibiotics were working. She wants to ensure that he has mouth care q2h due to h/o head/neck cancer. SW last week asked for her top 3 choices for SNF last week; they were expecting to stay in rehab until 8/21. Today, she was informed that [facility] will only pay through 8/16 and then will need [place] to another facility. She feels like he is wasting his rehab days while he has an acute illness. His wife reached out to the ENT and Dr. Harrington Challenger; she thought maybe he needed to go to [facility] or to the acute side. SW today told her that maybe he isn't stable for transport to a SNF at this time related to his PNA. Shejust wants the best for him and she just hates that he's going through this. She is "realistic in knowing that he hasn't made any gains with PT, but he has had every set back possible".  Review of Systems: unable to perform due to mental status  Past Medical History:  Diagnosis Date  . Anxiety    Claustrophobia  . Arthritis   . Cancer (HCC)    nasopharynx  . Chronic cough    EXCESS MUCUS BUILDUP SINCE ORAL SURGERY FROM CANCER   . Chronic kidney disease    Renal Insuffiency , Creatinine has been in the 2- 3 range, 11/27/14 Creatine 1.4  . Diabetes mellitus without complication ([place])    Type II  . Gout   . History of radiation therapy 12/23/2014- 02/10/2015   Nasopharynx / Neck Adenopathy   . Hyperlipidemia   . Hypertension   . Inguinal hernia   . Right inguinal hernia 08/23/2017    Past Surgical History:  Procedure Laterality Date  . colonoscopy    . COLONOSCOPY W/ POLYPECTOMY    . INGUINAL HERNIA REPAIR Right 08/23/2017   Procedure: OPEN REPAIR RIGHT INGUINAL HERNIA WITH MESH;  Surgeon: Fanny Skates, MD;  Location: [facility];  Service: General;  Laterality: Right;  GENERAL AND TAP BLOCK  . INSERTION OF MESH Right 08/23/2017   Procedure: INSERTION OF MESH;  Surgeon: Fanny Skates, MD;  Location: [facility];  Service: General;  Laterality: Right;  GENERAL AND TAP BLOCK  . IR GASTROSTOMY TUBE MOD SED  02/06/2018  . KNEE SURGERY Left 1990's   tibia crushed, pinned and bone grafted  . LYMPH NODE BIOPSY    . MULTIPLE EXTRACTIONS WITH ALVEOLOPLASTY N/A 12/06/2014   Procedure: Extraction of tooth #'s 2,4,5,6,7,8,9,10,11,12,13,14,15, 20,22,26,27,28,29 with alveoloplasty.;  Surgeon: Pete Glatter  Kulinski, DDS;  Location: [place];  Service: Oral Surgery;  Laterality: N/A;     reports that he quit smoking about 19 years ago. He has a 25.00 pack-year smoking history. He has never used smokeless tobacco. He reports that he does not drink alcohol or use drugs.  No Known Allergies  Family History  Problem Relation Age of Onset  . Cancer Mother        throat ca  . Cancer Father        lung ca  . Cancer Brother        throat ca  . Heart block Brother     Prior to Admission medications   Medication Sig Start Date End Date Taking? Authorizing Provider  Amino Acids-Protein Hydrolys (FEEDING SUPPLEMENT, PRO-STAT SUGAR FREE 64,) LIQD Place 30 mLs into feeding tube daily. 02/03/18   Donzetta Starch, NP  amLODipine (NORVASC) 10 MG tablet Take 1 tablet (10 mg total) by mouth daily. 02/03/18   Donzetta Starch, NP  artificial tears (LACRILUBE) OINT ophthalmic ointment Place into both eyes every 3 (three) hours as needed for dry eyes. 02/02/18   Donzetta Starch, NP  atorvastatin (LIPITOR) 20 MG tablet  Take 1 tablet (20 mg total) by mouth daily at 6 PM. 02/02/18   Donzetta Starch, NP  chlorhexidine (PERIDEX) 0.12 % solution 15 mLs by Mouth Rinse route 2 (two) times daily. 02/02/18   Donzetta Starch, NP  fenofibrate 160 MG tablet Take 160 mg by mouth daily.    [provider]  insulin aspart (NOVOLOG) 100 UNIT/ML injection Inject 0-15 Units into the skin every 4 (four) hours. 02/02/18   Donzetta Starch, NP  lisinopril (PRINIVIL,ZESTRIL) 20 MG tablet Take 1 tablet (20 mg total) by mouth daily. 02/02/18   Donzetta Starch, NP  mouth rinse LIQD solution 15 mLs by Mouth Rinse route every 2 (two) hours. 02/02/18   Donzetta Starch, NP  Multiple Vitamin (MULTIVITAMIN) LIQD Place 15 mLs into feeding tube daily. 02/03/18   Donzetta Starch, NP  Nutritional Supplements (FEEDING SUPPLEMENT, JEVITY 1.2 CAL,) LIQD Place 1,000 mLs into feeding tube continuous. 02/02/18   Donzetta Starch, NP  nystatin (MYCOSTATIN) 100000 UNIT/ML suspension Use as directed 5 mLs (500,000 Units total) in the mouth or throat 4 (four) times daily. 02/02/18   Donzetta Starch, NP  polyethylene glycol (MIRALAX / GLYCOLAX) packet Take 17 g by mouth daily. 02/03/18   Donzetta Starch, NP  senna-docusate (SENOKOT-S) 8.6-50 MG tablet Take 1 tablet by mouth 2 (two) times daily. 02/02/18   Donzetta Starch, NP  sodium chloride 0.9 % infusion Inject 500 mLs into the vein continuous. IV at 3m/hr 02/02/18   BDonzetta Starch NP  tiZANidine (ZANAFLEX) 4 MG tablet Take 4 mg by mouth at bedtime. 12/12/17   [provider]    Physical Exam: Vitals:   02/25/18 0806 02/25/18 0816 02/25/18 1206 02/25/18 1620  BP: (!) 155/85  (!) 141/89 130/74  Pulse: 91 89 69 79  Resp: '20 20 20   '$ Temp: 97.9 F (36.6 C)  98.2 F (36.8 C) 97.9 F (36.6 C)  TempSrc: Oral  Oral Oral  SpO2: 92% 93% 95% 97%  Weight:      Height:        Constitutional: moaning, eyes closed Vitals:   02/25/18 0806 02/25/18 0816 02/25/18 1206 02/25/18 1620  BP: (!) 155/85  (!) 141/89  130/74  Pulse: 91 89 69 79  Resp:  $'20 20 20   'Y$ Temp: 97.9 F (36.6 C)  98.2 F (36.8 C) 97.9 F (36.6 C)  TempSrc: Oral  Oral Oral  SpO2: 92% 93% 95% 97%  Weight:      Height:       Eyes: PERRL, lids and conjunctivae normal ENMT: Mucous membranes are moist. Posterior pharynx clear of any exudate or lesions.Normal dentition.  Neck: normal, supple, no masses, no thyromegaly Respiratory:  CTAB, no increased WOB Cardiovascular: Regular rate and rhythm, no murmurs / rubs / gallops.  Abdomen: no tenderness, no masses palpated. No hepatosplenomegaly. Bowel sounds positive.   Skin: no rashes, lesions, ulcers. No induration Neurologic: Moaning, moving L side, not following commands   Labs on Admission: I have personally reviewed following labs and imaging studies  CBC: Recent Labs  Lab 02/20/18 0600 02/23/18 1505 02/24/18 0434 02/25/18 0547  WBC 7.6 11.4* 9.5 8.3  HGB 9.5* 9.5* 10.4* 9.7*  HCT 29.7* 30.4* 33.7* 31.5*  MCV 80.7 81.9 82.2 80.8  PLT 301 349 389 354   Basic Metabolic Panel: Recent Labs  Lab 02/20/18 0600 02/23/18 1505 02/24/18 0434 02/25/18 0547  NA 135 137 138 138  K 4.5 4.6 4.3 4.3  CL 98 100 101 99  CO2 '26 27 29 27  '$ GLUCOSE 148* 183* 166* 151*  BUN 48* 52* 49* 46*  CREATININE 1.77* 1.62* 1.59* 1.54*  CALCIUM 9.0 9.2 9.1 9.1  MG  --   --  2.4 2.0   GFR: Estimated Creatinine Clearance: 38 mL/min (A) (by C-G formula based on SCr of 1.54 mg/dL (H)). Liver Function Tests: Recent Labs  Lab 02/24/18 0434 02/25/18 0547  AST 35 35  ALT 37 38  ALKPHOS 76 66  BILITOT 0.3 0.3  PROT 6.6 6.6  ALBUMIN 2.1* 2.0*   No results for input(s): LIPASE, AMYLASE in the last 168 hours. No results for input(s): AMMONIA in the last 168 hours. Coagulation Profile: No results for input(s): INR, PROTIME in the last 168 hours. Cardiac Enzymes: No results for input(s): CKTOTAL, CKMB, CKMBINDEX, TROPONINI in the last 168 hours. BNP (last 3 results) No results for  input(s): PROBNP in the last 8760 hours. HbA1C: No results for input(s): HGBA1C in the last 72 hours. CBG: Recent Labs  Lab 02/25/18 0033 02/25/18 0434 02/25/18 0802 02/25/18 1126 02/25/18 1617  GLUCAP 132* 146* 148* 188* 115*   Lipid Profile: No results for input(s): CHOL, HDL, LDLCALC, TRIG, CHOLHDL, LDLDIRECT in the last 72 hours. Thyroid Function Tests: No results for input(s): TSH, T4TOTAL, FREET4, T3FREE, THYROIDAB in the last 72 hours. Anemia Panel: No results for input(s): VITAMINB12, FOLATE, FERRITIN, TIBC, IRON, RETICCTPCT in the last 72 hours. Urine analysis:    Component Value Date/Time   COLORURINE STRAW (A) 01/26/2018 1227   APPEARANCEUR CLEAR 01/26/2018 1227   LABSPEC 1.018 01/26/2018 1227   PHURINE 7.0 01/26/2018 1227   GLUCOSEU 150 (A) 01/26/2018 1227   HGBUR SMALL (A) 01/26/2018 1227   BILIRUBINUR NEGATIVE 01/26/2018 1227   KETONESUR NEGATIVE 01/26/2018 1227   PROTEINUR 100 (A) 01/26/2018 1227   NITRITE NEGATIVE 01/26/2018 1227   LEUKOCYTESUR NEGATIVE 01/26/2018 1227    Radiological Exams on Admission: Dg Chest Port 1 View  Result Date: 02/25/2018 CLINICAL DATA:  Pneumonia. EXAM: PORTABLE CHEST 1 VIEW COMPARISON:  02/23/2018 FINDINGS: Airspace opacification of the right mid lung and right base with slight improvement. Improving medial left basilar opacification. Cardiomediastinal silhouette and remainder the exam is unchanged. IMPRESSION: Improving bilateral multifocal airspace process likely improving pneumonia.  Electronically Signed   By: Marin Olp M.D.   On: 02/25/2018 16:22   Assessment/Plan Active Problems:   Pneumonia due to infectious organism   Malnutrition of moderate degree   Copious oral secretions   Goals of care, counseling/discussion   Palliative care by specialist  Per PN from 8/15 HCAP  Acute Hypoxic Respiratory Failure:  CXR from 8/10 with bibasilar pneumonia worse on R.  Repeat CXR today with worsening of bilateral R greater  than L lower lung pneumonia.  Will admit to acute care with evidence of worsening pneumonia.    Cefepime 8/10 - present Will add flagyl for possible aspiration Follow repeat MRSA PCR Follow urine strep.  Sputum ordered, but unlikely to be able to obtain. No blood cultures obtained.  Will hold off on obtaining cx at this time unless pt develops fever or WBC continues to worsen.    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  Now NPO after stroke.  S/p PEG placement.   Glucerna 1.2 cal at 75 ml/hr  200 cc q 3 hrs free water Prostat 30 ml daily  Leukocytosis: worsening, 2/2 above, continue to follow, may need to broaden abx.  Consider culturing if change in status or worsening.  Anemia:  H/H stable, negative hemoccult, follow  CKD III: unclear baseline, currently better than recent baseline, follow.  Apparently follows with renal as outpatinet.  Nasopharyngeal cancer s/p radiation: follows with ENT  HLD: finofibrate, fish oil, lipitor  T2DM: SSI  Hypertension: continue norvasc 10.  HTNsive emergency on initial presentation. ** avoid IV hydralazine as pt with hypotension with this  Goals of Care:  Discussed with daughter and mother over phone.  Recommended palliative care consult to discuss goals of care.  Discussed poor prognosis with his recent hemorrhagic stroke and now likely aspiration pneumonia.  Family agreeable to discuss with palliative care.  See above for many family concerns regarding his course up until this point.   DVT prophylaxis:  SCD's due to bleeding risk with ICH Code Status: DNR Family Communication: daughter and wife Disposition Plan: pending  Fayrene Helper MD [facility] Pager [phone]  If 7PM-7AM, please contact night-coverage [email or web address] Password [facility]  02/25/2018, 6:36 PM

## 2018-02-25 NOTE — Progress Notes (Signed)
PROGRESS NOTE    Adam Yang  YDX:[id number] DOB: Oct 10, 1945 DOA: 02/23/2018 PCP: Lawerance Cruel, MD   Brief Narrative:  Per previous consult note Adam Yang is an 72 y.o. male with past medical history of hypertension, diabetes, hyperlipidemia past radiation for nasopharynx cancer who presented on 7/18 for stroke-like symptoms, found to have left frontoparietal bleed.  He was unable to obtain an MRI when his HR dropped into the 30s repeatedly.  He had severe HTN and was admitted to the ICU.  He had resultant global aphasia, right hemiplegia, and right neglect.  He improved well enough to be appropriate for transfer to CIR.  On 7/25, he was found to be unresponsive with a BP of 76/48.  Patient had been hypertensive to 188/100 and was given 20 mg hydralazine with resultant BP 68/39 and unresponsive 30 minutes later.  He was given IVF bolus and had improved BP to 149/73 with increased mental status.  He was transferred to CIR later that day.  On 8/10, he was found to have bibasilar PNA, R>L.  He was started on Cefepime.  He has had minimal progression in CIR and so is recommended to go to SNF based on review of rehab notes.   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  He had some episodes where he would go outside and feel presyncopal.  His BUN/Creatinine levels were elevated, and he was sent to nephrology in April - but there was no good answer for what was wrong with the kidneys.  Has been on HTN medications for about 20 years.  He had been off Norvasc but it was added back over the last year.  He was also taking fenofibrate, fish oil but really no other medications.  He was not on ASA and had no h/o CAD.  7/18, he had a CVA.  He had a significant hemorrhage, not on anticoagulation.  He was treated in the ICU here and treated with permissive HTN.  He was able to try to talk and communicate.  He was transferred out of ICU to the stroke unit.   They felt that he would be a good candidate for rehab.  He was interacting and recognizing people but unable to verbalize.  He did have a dense right hemiparesis.  He was transferred to rehab.  He seems to have been downhill since arriving in rehab.  He came over Thursday evening, 7/25.  The night before transfer, his BP dropped to 70/40 and yet he was still transferred; she is concerned that this might have been premature.  Friday, they introduced the therapy plan.  He fell out of bed on Friday. He was moving his legs and active, "typical stroke, he thought he could do it."  He got a telesitter at that time.  Since then, he has seemed weaker.  Last week, SW told them this unit is for intense rehab, may need to think about long-range plans.  His family was considering bringing him home.  He seemed to be really trying with PT/OT.  He had an NG tube and he was getting accuchecks q4h - he previously had a PEG tube during his remote cancer and his wife was concerned about going that route again.  She requested that he be changed to Glucerna and not continue SSI.  Radiology came over the weekend to obtain her consent to transition him to PEG tube. His wife was concered about this and she wanted to  wait to proceed with the tube.  His wife requested that he not go for a PEG yet.  He went on Monday and got the PEG despite her concerns - although the wife did sign the consent on Saturday.  His wife is now concerned that he has a PEG; getting 3 hours of therapy services; pain due to the PEG - so he was not meeting goals of therapy and that "all these setbacks" may be the reason.  Last week, Monday/Tuesday he was doing well with PT.  Wednesday, he was excessively fatigued and had difficulty participating in therapy.  BP 90/50.  His wife was then concerned about excessive medication for hypertension.  His wife was then concerned that he was started on new medications not discussed with her ("Ritalin, Baclofen, Vasopressin").   She had not spoken with Dr. Letta Pate and so was again frustrated.  His wife wanted to [name] all of his medications since he had not previously been on many medications at home.  Friday evening, his wife noticed that he felt warm; the weekend nurse was concerned and sent him for a CXR.  He was started on antibiotics for possible aspiration and was started on respiratory toilet.  Monday, his wife was concerned that she still had not met Dr. Letta Pate.  She felt that the antibiotics were working.  She wants to ensure that he has mouth care q2h due to h/o head/neck cancer.  SW last week asked for her top 3 choices for SNF last week; they were expecting to stay in rehab until 8/21.  Today, she was informed that [facility] will only pay through 8/16 and then will need to go to another facility.  She feels like he is wasting his rehab days while he has an acute illness.  His wife reached out to the ENT and Dr. Harrington Challenger; she thought maybe he needed to go to [facility] or to the acute side.  SW today told her that maybe he isn't stable for transport to a SNF at this time related to his PNA.  She just wants the best for him and she just hates that he's going through this.  She is "realistic in knowing that he hasn't made any gains with PT, but he has had every set back possible".     Assessment & Plan:   Active Problems:   HCAP (healthcare-associated pneumonia)   Malnutrition of moderate degree   Copious oral secretions   Goals of care, counseling/discussion   Palliative care by specialist   HCAP  Acute Hypoxic Respiratory Failure:  CXR from 8/10 with bibasilar pneumonia worse on R.  Repeat CXR 8/15 with worsening of bilateral R greater than L lower lung pneumonia.  Admitted to acute care from rehab on 8/15 for worsening pneumonia.  Cefepime 8/10 - present Will add flagyl for possible aspiration (8/15 - present) Follow repeat MRSA PCR (negative - will hold off on MRSA coverage at this time) Follow urine strep.  Sputum  ordered, but unlikely to be able to obtain. No blood cultures obtained.  Will hold off on obtaining cx at this time unless pt develops fever or WBC improved today. Wean O2 as tolerated (on 3.5 L by [place] this AM) Pulmonary toilet (chest PT and suctioning ordered) F/u CXR today  Dysphagia  Aspiration:  Baseline dysphagia 2/2 radiation with nasopharyngeal cancer.  Now NPO after stroke.  S/p PEG placement.  Pt with lots of secretions, being suctioned at this time, could be secretions, but pt also getting  large volume of tube feeds and free water and potentially could be contributing. 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 2/2 above, continue to follow, may need to broaden abx.  Consider culturing if change in status or worsening.  Anemia:  H/H stable, negative hemoccult, follow. Stable.  Hypernatremia: improved, watch with decreased free water  CKD III: unclear baseline, currently better than recent baseline, follow.  Apparently follows with renal as outpatinet.  Nasopharyngeal cancer s/p radiation: follows with ENT  HLD: finofibrate, fish oil, lipitor  T2DM: SSI  Hypertension: continue norvasc 10.  HTNsive emergency on initial presentation. ** avoid IV hydralazine as pt with hypotension with this  Goals of Care:  Discussed with daughter and mother over phone on 8/15.  Recommended palliative care consult to discuss goals of care.  Discussed poor prognosis with his recent  hemorrhagic stroke and now likely aspiration pneumonia.  Family agreeable to discuss with palliative care.  See above for many family concerns regarding his course up until this point. - planning for meeting on Monday - robinul 0.2 mg q4 hrs prn for secretions  DVT prophylaxis: SCD's due to bleeding risk with ICH Code Status: DNR Family Communication: daughter and wife on 8/15 Disposition Plan: pending   Consultants:   Taking over from Rehab  Procedures:   PEG on 7/29 Study Conclusions  - Left ventricle: The cavity size was normal. Systolic function was   vigorous. The estimated ejection fraction was in the range of 65%   to 70%. Wall motion was normal; there were no regional wall   motion abnormalities. Left ventricular diastolic function   parameters were normal. - Aortic valve: Trileaflet; moderately thickened, moderately   calcified leaflets. Transvalvular velocity was within the normal   range. There was no stenosis. There was no regurgitation. - Mitral valve: There was no regurgitation. - Right ventricle: The cavity size was normal. Wall thickness was   normal. Systolic function was normal. - Tricuspid valve: There was no regurgitation. - Pulmonary arteries: Systolic pressure could not be accurately   estimated.  Impressions:  - Limited study quality, hyperdynamic LVEF, aortic valve leaflets   are calcified, no stenosis, no source of embolism was seen.  Antimicrobials:  Anti-infectives (From admission, onward)   Start     Dose/Rate Route Frequency Ordered Stop   02/24/18 1000  ceFEPIme (MAXIPIME) 2 g in sodium chloride 0.9 % 100 mL IVPB     2 g 200 mL/hr over 30 Minutes Intravenous Every 24 hours 02/23/18 2243     02/23/18 2245  metroNIDAZOLE (FLAGYL) 50 mg/ml oral suspension 500 mg     500 mg Per Tube 3 times daily 02/23/18 2234       Subjective: Lots of secretions. Looks at me, but no intelligible speech.  Objective: Vitals:   02/25/18 0421 02/25/18  0806 02/25/18 0816 02/25/18 1206  BP: (!) 142/77 (!) 155/85  (!) 141/89  Pulse: 86 91 89 69  Resp: '20 20 20 20  '$ Temp: 98.2  F (36.8 C) 97.9 F (36.6 C)  98.2 F (36.8 C)  TempSrc: Oral Oral  Oral  SpO2: 95% 92% 93% 95%  Weight:      Height:        Intake/Output Summary (Last 24 hours) at 02/25/2018 1603 Last data filed at 02/25/2018 0600 Gross per 24 hour  Intake 2826.25 ml  Output -  Net 2826.25 ml   Filed Weights   02/24/18 1454  Weight: 60.2 kg    Examination:  General: No acute distress. Cardiovascular: Heart sounds show a regular rate, and rhythm. Lungs: Transmitted upper airway sounds.  Lots of secretions. Abdomen: Soft, nontender, nondistended.  Peg in place Neurological: Looks at me when I talk, but no intelligble speech.  R sided weakness.  Skin: Warm and dry. No rashes or lesions. Extremities: No clubbing or cyanosis. No edema.  Data Reviewed: I have personally reviewed following labs and imaging studies  CBC: Recent Labs  Lab 02/20/18 0600 02/23/18 1505 02/24/18 0434 02/25/18 0547  WBC 7.6 11.4* 9.5 8.3  HGB 9.5* 9.5* 10.4* 9.7*  HCT 29.7* 30.4* 33.7* 31.5*  MCV 80.7 81.9 82.2 80.8  PLT 301 349 389 676   Basic Metabolic Panel: Recent Labs  Lab 02/20/18 0600 02/23/18 1505 02/24/18 0434 02/25/18 0547  NA 135 137 138 138  K 4.5 4.6 4.3 4.3  CL 98 100 101 99  CO2 '26 27 29 27  '$ GLUCOSE 148* 183* 166* 151*  BUN 48* 52* 49* 46*  CREATININE 1.77* 1.62* 1.59* 1.54*  CALCIUM 9.0 9.2 9.1 9.1  MG  --   --  2.4 2.0   GFR: Estimated Creatinine Clearance: 38 mL/min (A) (by C-G formula based on SCr of 1.54 mg/dL (H)). Liver Function Tests: Recent Labs  Lab 02/24/18 0434 02/25/18 0547  AST 35 35  ALT 37 38  ALKPHOS 76 66  BILITOT 0.3 0.3  PROT 6.6 6.6  ALBUMIN 2.1* 2.0*   No results for input(s): LIPASE, AMYLASE in the last 168 hours. No results for input(s): AMMONIA in the last 168 hours. Coagulation Profile: No results for input(s): INR,  PROTIME in the last 168 hours. Cardiac Enzymes: No results for input(s): CKTOTAL, CKMB, CKMBINDEX, TROPONINI in the last 168 hours. BNP (last 3 results) No results for input(s): PROBNP in the last 8760 hours. HbA1C: No results for input(s): HGBA1C in the last 72 hours. CBG: Recent Labs  Lab 02/24/18 2037 02/25/18 0033 02/25/18 0434 02/25/18 0802 02/25/18 1126  GLUCAP 79 132* 146* 148* 188*   Lipid Profile: No results for input(s): CHOL, HDL, LDLCALC, TRIG, CHOLHDL, LDLDIRECT in the last 72 hours. Thyroid Function Tests: No results for input(s): TSH, T4TOTAL, FREET4, T3FREE, THYROIDAB in the last 72 hours. Anemia Panel: No results for input(s): VITAMINB12, FOLATE, FERRITIN, TIBC, IRON, RETICCTPCT in the last 72 hours. Sepsis Labs: Recent Labs  Lab 02/24/18 0434 02/25/18 0547  PROCALCITON 0.29 0.21    Recent Results (from the past 240 hour(s))  MRSA PCR Screening     Status: None   Collection Time: 02/23/18  5:59 PM  Result Value Ref Range Status   MRSA by PCR NEGATIVE NEGATIVE Final    Comment:        The GeneXpert MRSA Assay (FDA approved for NASAL specimens only), is one component of a comprehensive MRSA colonization surveillance program. It is not intended to diagnose MRSA infection nor to guide or monitor treatment for MRSA infections. Performed at [facility], [place] [address], [place], [place] [address]  Radiology Studies: Dg Chest Port 1 View  Result Date: 02/23/2018 CLINICAL DATA:  Pneumonia EXAM: PORTABLE CHEST 1 VIEW COMPARISON:  02/18/2018, PET CT 06/17/2015 FINDINGS: Worsened airspace disease at the left base and right mid to lower lung. No pleural effusion. Stable cardiomediastinal silhouette. No pneumothorax. IMPRESSION: Worsening of bilateral right greater than left lower lung pneumonia. Electronically Signed   By: Donavan Foil M.D.   On: 02/23/2018 16:43        Scheduled Meds: . amLODipine  10 mg Oral Daily  . atorvastatin   20 mg Oral q1800  . chlorhexidine  15 mL Mouth Rinse BID  . diclofenac sodium  2 g Topical QID  . feeding supplement (PRO-STAT SUGAR FREE 64)  30 mL Per Tube BID  . fenofibrate  160 mg Oral Daily  . free water  100 mL Per Tube Q3H  . insulin aspart  0-9 Units Subcutaneous Q4H  . mouth rinse  15 mL Mouth Rinse Q2H  . methylphenidate  5 mg Per Tube BID WC  . metoprolol tartrate  25 mg Per Tube BID  . metroNIDAZOLE  500 mg Per Tube TID  . multivitamin  15 mL Per Tube Daily   Continuous Infusions: . ceFEPime (MAXIPIME) IV 2 g (02/25/18 1140)  . feeding supplement (JEVITY 1.5 CAL/FIBER)       LOS: 2 days    Time spent: over 30 min MDM moderate with multiple medical issues    Fayrene Helper, MD [facility] Pager [phone]  If 7PM-7AM, please contact night-coverage [email or web address] Password [facility] 02/25/2018, 4:03 PM

## 2018-02-25 NOTE — Progress Notes (Signed)
Nutrition Brief Note  RD received page from MD expressing concern about aspiration, he wanted patient's tubefeed rate and free water flushes decreased.  Changed patient to Jevity 1.5 at 34mL/hr, Pro-stat 61mL BID MD Decreased free water to 172mL  Regime provides 2000 calories, 107 grams of protein, and 1750mL free water over 24 hours. Noted that patient was previously receiving TF over 20 hours, but appears patient is being prepared for discharge.  Adam Yang. Adam Lorenz, MS, RD LDN Inpatient Clinical Dietitian Pager [phone]

## 2018-02-25 NOTE — Evaluation (Signed)
Speech Language Pathology Evaluation Patient Details Name: Adam Yang MRN: [id number] DOB: 1945-10-29 Today's Date: 02/25/2018 Time: 4098-1191 SLP Time Calculation (min) (ACUTE ONLY): 11 min  Problem List:  Patient Active Problem List   Diagnosis Date Noted  . Malnutrition of moderate degree 02/24/2018  . Pneumonia of both lungs due to infectious organism 02/23/2018  . HCAP (healthcare-associated pneumonia) 02/23/2018  . Multifocal pneumonia 02/22/2018  . Aspiration pneumonia of right lower lobe due to milk ([place])   . Dysphasia   . Anemia of chronic disease   . Hypernatremia   . Transaminitis   . Hypertensive emergency 02/02/2018  . Intraparenchymal hemorrhage of brain ([place]) 02/02/2018  . Hemiparesis affecting right side as late effect of stroke ([place])   . Aphasia, post-stroke   . Dysphagia, post-stroke   . Nontraumatic subcortical hemorrhage of left cerebral hemisphere ([place])   . Diabetes mellitus type 2 in nonobese (HCC)   . AKI (acute kidney injury) ([place])   . Hyponatremia   . Chronic kidney disease   . Cytotoxic brain edema ([place]) 01/28/2018  . Brain herniation ([place]) 01/28/2018  . IVH (intraventricular hemorrhage) ([place]) 01/27/2018  . Right inguinal hernia 08/23/2017  . Chronic kidney disease, stage III (moderate) ([place]) 02/18/2017  . 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 Other fatigue 06/18/2016  . Essential hypertension 10/17/2015  . Mucositis due to chemotherapy 01/24/2015  . Hyperglycemia due to type 2 diabetes mellitus ([place]) 01/07/2015  . Chronic anxiety 12/19/2014  . Stage 2 chronic renal impairment associated with type 2 diabetes mellitus ([place]) 12/19/2014  . Type 2 diabetes mellitus, controlled, with renal complications ([place]) 47/[id number]/9562  . History of nasopharyngeal cancer 11/26/2014   Past Medical History:  Past Medical History:  Diagnosis Date  . Anxiety    Claustrophobia  . Arthritis   . Cancer (HCC)     nasopharynx  . Chronic cough    EXCESS MUCUS BUILDUP SINCE ORAL SURGERY FROM CANCER   . Chronic kidney disease    Renal Insuffiency , Creatinine has been in the 2- 3 range, 11/27/14 Creatine 1.4  . Diabetes mellitus without complication ([place])    Type II  . Gout   . History of radiation therapy 12/23/2014- 02/10/2015   Nasopharynx / Neck Adenopathy  . Hyperlipidemia   . Hypertension   . Inguinal hernia   . Right inguinal hernia 08/23/2017   Past Surgical History:  Past Surgical History:  Procedure Laterality Date  . colonoscopy    . COLONOSCOPY W/ POLYPECTOMY    . INGUINAL HERNIA REPAIR Right 08/23/2017   Procedure: OPEN REPAIR RIGHT INGUINAL HERNIA WITH MESH;  Surgeon: Fanny Skates, MD;  Location: [facility];  Service: General;  Laterality: Right;  GENERAL AND TAP BLOCK  . INSERTION OF MESH Right 08/23/2017   Procedure: INSERTION OF MESH;  Surgeon: Fanny Skates, MD;  Location: [facility];  Service: General;  Laterality: Right;  GENERAL AND TAP BLOCK  . IR GASTROSTOMY TUBE MOD SED  02/06/2018  . KNEE SURGERY Left 1990's   tibia crushed, pinned and bone grafted  . LYMPH NODE BIOPSY    . MULTIPLE EXTRACTIONS WITH ALVEOLOPLASTY N/A 12/06/2014   Procedure: Extraction of tooth #'s 2,4,5,6,7,8,9,10,11,12,13,14,15, 20,22,26,27,28,29 with alveoloplasty.;  Surgeon: Lenn Cal, DDS;  Location: [facility];  Service: Oral Surgery;  Laterality: N/A;   HPI:  Patient is a 72 y/o male admitted from CIR at [facility] due to HCAP with acute hypoxic respiratory failure. Of note, admitted on 7/18 for  left frontoparietal bleed with resultant global aphasia, right hemiplegia, and right neglect. PMH significant for hypertension, diabetes, hyperlipidemia past radiation for nasopharynx cancer. MBS 01/27/18 revealed severe oropharyngeal dysphagia and pt is s/p PEG placement 02/05/18.   Assessment / Plan / Recommendation Clinical Impression   Patient presents with ongoing global aphasia, dysarthria, cognitive impairments and  dysphagia. Difficult to determine at times if pt's lack of attempts/responses for simple tasks are due to impaired language/cognition or willingness to participate. Pt did make some efforts to communicate at phrase/sentence level, however utterances were not intelligible given dysarthria, extremely wet vocal quality and suspected paraphasias. Initial Y/N responses appeared generally accurate, and pt did respond appropriately "purple" when asked if the color of an object was red. Pt's only other intelligible responses were "mm-hmm" and "no," though for basic biographical questions were accurate <50% of the time, and <25% of the time for immediate environmental questions. Pt did follow 1 step commands x2 ("open your mouth" and "cough") during functional activity (oral care). Cough is weak and not productive, vocal quality is very wet gurgling. No family present at time of assessment. Pt would benefit from skilled ST to maximize functional communication and interaction with loved ones and medical providers.     SLP Assessment  SLP Recommendation/Assessment: Patient needs continued Speech Lanaguage Pathology Services SLP Visit Diagnosis: Aphasia (R47.01);Dysarthria and anarthria (R47.1);Cognitive communication deficit (R41.841)    Follow Up Recommendations  Skilled Nursing facility    Frequency and Duration min 1 x/week  2 weeks      SLP Evaluation Cognition  Overall Cognitive Status: Difficult to assess Arousal/Alertness: Awake/alert Orientation Level: Oriented to person Attention: Sustained Sustained Attention: Appears intact Awareness: Impaired Problem Solving: Impaired Problem Solving Impairment: Verbal basic Safety/Judgment: Impaired       Comprehension  Auditory Comprehension Overall Auditory Comprehension: Impaired Yes/No Questions: Impaired Basic Biographical Questions: 26-50% accurate Basic Immediate Environment Questions: 0-24% accurate Complex Questions: 0-24%  accurate Commands: Impaired One Step Basic Commands: 50-74% accurate Conversation: Simple Visual Recognition/Discrimination Discrimination: Exceptions to [facility] Common Objects: Other (comment)(no attempts) Reading Comprehension Reading Status: Not tested    Expression Verbal Expression Overall Verbal Expression: Impaired Initiation: Impaired Automatic Speech: Name;Counting(states "doug" vs "Linkin", counts 1-4 with cue for 1) Level of Generative/Spontaneous Verbalization: Word;Phrase(phrases unintelligible) Repetition: Impaired Level of Impairment: Word level Naming: Impairment Responsive: Not tested Confrontation: Impaired Common Objects: Other (comment)(no attempts) Convergent: Not tested Divergent: Not tested Pragmatics: No impairment Interfering Components: Speech intelligibility;Anatomical limitation Written Expression Dominant Hand: Left Written Expression: Not tested   Oral / Motor  Oral Motor/Sensory Function Overall Oral Motor/Sensory Function: Moderate impairment Facial ROM: Reduced left;Suspected CN VII (facial) dysfunction Facial Symmetry: Abnormal symmetry left;Suspected CN VII (facial) dysfunction Facial Strength: Reduced left;Suspected CN VII (facial) dysfunction Lingual ROM: Reduced right;Reduced left Lingual Symmetry: (left atrophy) Lingual Strength: Reduced Motor Speech Overall Motor Speech: Impaired Respiration: Within functional limits Phonation: Low vocal intensity;Wet Resonance: (difficult to assess) Articulation: Impaired Level of Impairment: Word Intelligibility: Intelligibility reduced Word: 25-49% accurate("mm-hmm", "no", "purple") Phrase: 0-24% accurate Sentence: 0-24% accurate Conversation: 0-24% accurate Motor Planning: Impaired Level of Impairment: Word Motor Speech Errors: Unaware Interfering Components: Anatomical limitations;Inadequate dentition   GO                   Deneise Lever, [place], [place] Speech-Language  Pathologist [place] 02/25/2018, 10:46 AM

## 2018-02-26 LAB — GLUCOSE, CAPILLARY
GLUCOSE-CAPILLARY: 177 mg/dL — AB (ref 70–99)
GLUCOSE-CAPILLARY: 170 mg/dL — AB (ref 70–99)
Glucose-Capillary: 133 mg/dL — ABNORMAL HIGH (ref 70–99)
Glucose-Capillary: 180 mg/dL — ABNORMAL HIGH (ref 70–99)
Glucose-Capillary: 183 mg/dL — ABNORMAL HIGH (ref 70–99)
Glucose-Capillary: 193 mg/dL — ABNORMAL HIGH (ref 70–99)

## 2018-02-26 LAB — CBC
HEMATOCRIT: 29.9 % — AB (ref 39.0–52.0)
Hemoglobin: 9.3 g/dL — ABNORMAL LOW (ref 13.0–17.0)
MCH: 25.4 pg — AB (ref 26.0–34.0)
MCHC: 31.1 g/dL (ref 30.0–36.0)
MCV: 81.7 fL (ref 78.0–100.0)
PLATELETS: 372 10*3/uL (ref 150–400)
RBC: 3.66 MIL/uL — AB (ref 4.22–5.81)
RDW: 15.4 % (ref 11.5–15.5)
WBC: 7.3 10*3/uL (ref 4.0–10.5)

## 2018-02-26 LAB — BASIC METABOLIC PANEL
ANION GAP: 8 (ref 5–15)
BUN: 52 mg/dL — ABNORMAL HIGH (ref 8–23)
CALCIUM: 8.8 mg/dL — AB (ref 8.9–10.3)
CO2: 30 mmol/L (ref 22–32)
Chloride: 99 mmol/L (ref 98–111)
Creatinine, Ser: 1.75 mg/dL — ABNORMAL HIGH (ref 0.61–1.24)
GFR calc Af Amer: 44 mL/min — ABNORMAL LOW (ref >60)
GFR calc non Af Amer: 38 mL/min — ABNORMAL LOW (ref >60)
GLUCOSE: 146 mg/dL — AB (ref 70–99)
Potassium: 4.4 mmol/L (ref 3.5–5.1)
Sodium: 137 mmol/L (ref 135–145)

## 2018-02-26 LAB — MAGNESIUM: Magnesium: 2.3 mg/dL (ref 1.7–2.4)

## 2018-02-26 MED ORDER — GLYCOPYRROLATE 0.2 MG/ML IJ SOLN
0.2000 mg | Freq: Two times a day (BID) | INTRAMUSCULAR | Status: DC
  Administered 2018-02-26 – 2018-03-02 (×9): 0.2 mg via INTRAVENOUS
  Filled 2018-02-26 (×9): qty 1

## 2018-02-26 NOTE — Progress Notes (Signed)
Daily Progress Note   Patient Name: Adam Yang       Date: 02/26/2018 DOB: [birth year]  Age: 72 y.o. MRN#: [id number] Attending Physician: Elodia [place]., * Primary Care Physician: Lawerance Cruel, MD Admit Date: 02/23/2018  Reason for Consultation/Follow-up: Establishing goals of care and Psychosocial/spiritual support  Subjective: Patient seen, chart reviewed.  Met with patient's wife, Tye Maryland.  Tye Maryland has multiple medical issues and concerns as follows: She is concerned that his blood pressure is too low for him to participate in rehab; statins caused muscular weakness in the past; he is not being seen by PT enough; wants insulin stopped as well as blood sugar checks  We attempted to talk through these concerns especially in the setting of a relatively new stroke that statins would be added, tighter blood pressure control, as well as blood sugar control.  She is trying to maintain hope but it is clear to me today that she is also seen clinical decline.  She becomes tearful when she talks about what the past year has been like for [facility] and herself.  I believe she was very disappointed when he was not able to progress at CIR. she feels that now he is being "left to right in the bed".  I spoke to her again about how ill he is in terms of pneumonia and that when people have pneumonia it is not the best time to evaluate their ability for rehab and she did agree.  She is verbalizing feeling very overwhelmed.  Her sister came to visit yesterday and was unable to stay in the room and left in tears which upset Tye Maryland  I directly asked her what would Marden Noble look like, in your mind's eye, if he were nearing end of life.  She states it would be lack of communication.  She continues to verbalize that "he  is not done yet".  She is struggling with finding a skilled nursing facility with rehab that she feels like is "decent".  She verbalizes that [name]'s biggest fear is choking to death.  She states he has had nightmares about this even prior to the stroke  Talked about changes to feeding to try to minimize secretions but maintain adequate nutrition: checking of residuals. Tye Maryland, is an Therapist, sports and is familiar with this process.  We discussed again  his albumin level of 2.0 despite tube feedings and that additional clinical information on residuals might be helpful in terms of ascertaining where Marden Noble is in terms of clinical decline.  I also shared with her that there is a follow-up chest x-ray today that  will also contribute to the bigger picture.  Marden Noble has been receiving antibiotics and we would think that his lung function would improve but if they do not that could also be a factor, sign, that things are changing for [name]  Length of Stay: 3  Current Medications: Scheduled Meds:  . amLODipine  10 mg Oral Daily  . atorvastatin  20 mg Oral q1800  . chlorhexidine  15 mL Mouth Rinse BID  . diclofenac sodium  2 g Topical QID  . feeding supplement (PRO-STAT SUGAR FREE 64)  30 mL Per Tube BID  . fenofibrate  160 mg Oral Daily  . free water  100 mL Per Tube Q3H  . insulin aspart  0-9 Units Subcutaneous Q4H  . mouth rinse  15 mL Mouth Rinse Q2H  . methylphenidate  5 mg Per Tube BID WC  . metoprolol tartrate  25 mg Per Tube BID  . metroNIDAZOLE  500 mg Per Tube TID  . multivitamin  15 mL Per Tube Daily    Continuous Infusions: . ceFEPime (MAXIPIME) IV Stopped (02/25/18 1210)  . feeding supplement (JEVITY 1.5 CAL/FIBER) 1,000 mL (02/25/18 1940)    PRN Meds: acetaminophen (TYLENOL) oral liquid 160 mg/5 mL, artificial tears, glycopyrrolate, tiZANidine  Physical Exam  Constitutional:  Frail, elderly male who appears ill Opens his eyes to voice otherwise somnolent  HENT:  Head: Normocephalic and  atraumatic.  Cardiovascular: Normal rate.  Pulmonary/Chest: [place] normal.  Upper airway secretions have decreased but are still present  Neurological:  Somnolent Will open his eyes to voice and light touch  Skin: Skin is warm and dry. There is pallor.  Psychiatric:  Pushed his wife's hands away this morning when she attempted to suction him  Nursing note and vitals reviewed.           Vital Signs: BP 105/60 (BP Location: Left Arm)   Pulse 77   Temp (!) 97.4 F (36.3 C) (Axillary)   Resp 16   Ht _0  (1.753 m) Comment: 02/02/18  Wt 60.2 kg Comment: 02/22/18  SpO2 99%   BMI 19.60 kg/m  SpO2: SpO2: 99 % O2 Device: O2 Device: Nasal Cannula O2 Flow Rate: O2 Flow Rate (L/min): 3.5 L/min  Intake/output summary:   Intake/Output Summary (Last 24 hours) at 02/26/2018 1054 Last data filed at 02/25/2018 1800 Gross per 24 hour  Intake 180 ml  Output -  Net 180 ml   LBM: Last BM Date: 02/25/18 Baseline Weight: Weight: 60.2 kg(02/22/18) Most recent weight: Weight: 60.2 kg(02/22/18)       Palliative Assessment/Data:    Flowsheet Rows     Most Recent Value  Intake Tab  Referral Department  Hospitalist  Unit at Time of Referral  Med/Surg Unit  Palliative Care Primary Diagnosis  Sepsis/Infectious Disease  Date Notified  02/24/18  Palliative Care Type  New Palliative care  Reason Not Seen  Discharged before seen  Reason for referral  Clarify Goals of Care, Psychosocial or Spiritual support  Date of Admission  02/23/18  Date first seen by Palliative Care  02/25/18  # of days Palliative referral response time  1 Day(s)  # of days IP prior to Palliative referral  1  Clinical Assessment  Palliative  Performance Scale Score  40%  Pain Max last 24 hours  Not able to report  Pain Min Last 24 hours  Not able to report  Dyspnea Max Last 24 Hours  Not able to report  Dyspnea Min Last 24 hours  Not able to report  Nausea Max Last 24 Hours  Not able to report  Nausea Min Last 24 Hours   Not able to report  Anxiety Max Last 24 Hours  Not able to report  Anxiety Min Last 24 Hours  Not able to report  Other Max Last 24 Hours  Not able to report  Psychosocial & Spiritual Assessment  Palliative Care Outcomes  Patient/Family meeting held?  Yes  Who was at the meeting?  wife via phone  Palliative Care Outcomes  Provided psychosocial or spiritual support, Clarified goals of care  Patient/Family wishes: Interventions discontinued/not started   Mechanical Ventilation  Palliative Care follow-up planned  Yes, Facility      Patient Active Problem List   Diagnosis Date Noted  . Copious oral secretions   . Goals of care, counseling/discussion   . Palliative care by specialist   . Malnutrition of moderate degree 02/24/2018  . Pneumonia of both lungs due to infectious organism 02/23/2018  . Pneumonia due to infectious organism 02/23/2018  . Multifocal pneumonia 02/22/2018  . Aspiration pneumonia of right lower lobe due to milk ([place])   . Dysphasia   . Anemia of chronic disease   . Hypernatremia   . Transaminitis   . Hypertensive emergency 02/02/2018  . Intraparenchymal hemorrhage of brain ([place]) 02/02/2018  . 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 Aphasia, post-stroke   . Dysphagia, post-stroke   . Nontraumatic subcortical hemorrhage of left cerebral hemisphere ([place])   . Diabetes mellitus type 2 in nonobese (H[facility])   . AKI (acute kidney injury) ([place])   . Hyponatremia   . Chronic kidney disease   . Cytotoxic brain edema ([place]) 01/28/2018  . Brain herniation ([place]) 01/28/2018  . IVH (intraventricular hemorrhage) ([place]) 01/27/2018  . Right inguinal hernia 08/23/2017  . Chronic kidney disease, stage III (moderate) ([place]) 02/18/2017  . 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 Other fatigue 06/18/2016  . Essential hypertension 10/17/2015  . Mucositis due to chemotherapy 01/24/2015  . Hyperglycemia due to  type 2 diabetes mellitus ([place]) 01/07/2015  . Chronic anxiety 12/19/2014  . Stage 2 chronic renal impairment associated with type 2 diabetes mellitus ([place]) 12/19/2014  . Type 2 diabetes mellitus, controlled, with renal complications ([place]) 16/04/9603  . History of nasopharyngeal cancer 11/26/2014    Palliative Care Assessment & Plan   Patient Profile:  72 y.o. 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   Consult ordered for goals of care.  Tube feedings, residuals, free water are all being evaluated to decrease secretions and minimize aspiration pneumonia risk.  Follow-up chest x-ray ordered for today   Recommendations/Plan:  DNR/DNI  Goals of care meeting still scheduled for 2:00 on 02/27/2018 with palliative medicine provider  Schedule robinul 0.2 IV or 43m PT q12  Goals of Care and Additional Recommendations:  Limitations on Scope of Treatment: Full Scope Treatment  Code Status:    Code Status Orders  (  From admission, onward)         Start     Ordered   02/23/18 2227  Do not attempt resuscitation (DNR)  Continuous    Question Answer Comment  In the event of cardiac or respiratory ARREST Do not call a "code blue"   In the event of cardiac or respiratory ARREST Do not perform Intubation, CPR, defibrillation or ACLS   In the event of cardiac or respiratory ARREST Use medication by any route, position, wound care, and other measures to relive pain and suffering. May use oxygen, suction and manual treatment of airway obstruction as needed for comfort.      02/23/18 2226        Code Status History    Date Active Date Inactive Code Status Order ID Comments User Context   02/02/2018  1758 02/23/2018 2127 DNR [id number]  Cathlyn Parsons, PA-C Inpatient   02/02/2018 1758 02/02/2018 1758 Full Code [id number]  Cathlyn Parsons, PA-C Inpatient   01/26/2018 1304 02/02/2018 1756 DNR [id number]  Marliss Coots, PA-C Inpatient   01/26/2018 1136 01/26/2018 1304 Full Code [id number]  Marliss Coots, PA-C ED   01/26/2018 0925 01/26/2018 0932 Full Code [id number]  Marliss Coots, PA-C ED   02/14/2015 1608 02/16/2015 1724 Full Code [id number]  Elgergawy, Silver Huguenin, MD Inpatient   12/17/2014 1311 12/19/2014 0330 Full Code [id number]  Greggory Keen, MD HOV   12/17/2014 1302 12/17/2014 1311 Full Code [id number]  Greggory Keen, MD HOV   12/06/2014 0950 12/06/2014 1404 Full Code [id number]  Lenn Cal, DDS Inpatient       Prognosis:  Difficult to determine, but I would not be surprised if patient had a prognosis of weeks to months given his high risk of aspiration pneumonia secondary to severe dysphasia in the context of late effects of radiation treatment with nasopharyngeal cancer; protein calorie malnutrition with an albumin of 2.0; worsening pneumonia despite antibiotic treatment    Discharge Planning:  To Be Determined    Thank you for allowing the Palliative Medicine Team to assist in the care of this patient.   Time In: 0900 Time Out: 1000 Total Time 60 min Prolonged Time Billed  no       Greater than 50%  of this time was spent counseling and coordinating care related to the above assessment and plan.  Dory Horn, NP  Please contact Palliative Medicine Team phone at [phone] for questions and concerns.

## 2018-02-26 NOTE — Progress Notes (Signed)
CPT held throughout the night,. Spoken with the wife regarding the concerns of mucociliary clearance from his history of nasopharynx cancer requiring radiation. Patient has an ineffective cough. NTS PRN for secretion clearance. Pt is on 3.5L [place]. No respiratory compromise or distress noted.

## 2018-02-26 NOTE — Progress Notes (Signed)
PROGRESS NOTE    Adam Yang  YDX:[id number] DOB: Oct 10, 1945 DOA: 02/23/2018 PCP: Lawerance Cruel, MD   Brief Narrative:  Per previous consult note Adam Yang is an 72 y.o. male with past medical history of hypertension, diabetes, hyperlipidemia past radiation for nasopharynx cancer who presented on 7/18 for stroke-like symptoms, found to have left frontoparietal bleed.  He was unable to obtain an MRI when his HR dropped into the 30s repeatedly.  He had severe HTN and was admitted to the ICU.  He had resultant global aphasia, right hemiplegia, and right neglect.  He improved well enough to be appropriate for transfer to CIR.  On 7/25, he was found to be unresponsive with [name] BP of 76/48.  Patient had been hypertensive to 188/100 and was given 20 mg hydralazine with resultant BP 68/39 and unresponsive 30 minutes later.  He was given IVF bolus and had improved BP to 149/73 with increased mental status.  He was transferred to CIR later that day.  On 8/10, he was found to have bibasilar PNA, R>L.  He was started on Cefepime.  He has had minimal progression in CIR and so is recommended to go to SNF based on review of rehab notes.   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  He had some episodes where he would go outside and feel presyncopal.  His BUN/Creatinine levels were elevated, and he was sent to nephrology in April - but there was no good answer for what was wrong with the kidneys.  Has been on HTN medications for about 20 years.  He had been off Norvasc but it was added back over the last year.  He was also taking fenofibrate, fish oil but really no other medications.  He was not on ASA and had no h/o CAD.  7/18, he had Raye Slyter CVA.  He had Syra Sirmons significant hemorrhage, not on anticoagulation.  He was treated in the ICU here and treated with permissive HTN.  He was able to try to talk and communicate.  He was transferred out of ICU to the stroke unit.   They felt that he would be [name] good candidate for rehab.  He was interacting and recognizing people but unable to verbalize.  He did have [name] dense right hemiparesis.  He was transferred to rehab.  He seems to have been downhill since arriving in rehab.  He came over Thursday evening, 7/25.  The night before transfer, his BP dropped to 70/40 and yet he was still transferred; she is concerned that this might have been premature.  Friday, they introduced the therapy plan.  He fell out of bed on Friday. He was moving his legs and active, "typical stroke, he thought he could do it."  He got [name] telesitter at that time.  Since then, he has seemed weaker.  Last week, SW told them this unit is for intense rehab, may need to think about long-range plans.  His family was considering bringing him home.  He seemed to be really trying with PT/OT.  He had an NG tube and he was getting accuchecks q4h - he previously had [name] PEG tube during his remote cancer and his wife was concerned about going that route again.  She requested that he be changed to Glucerna and not continue SSI.  Radiology came over the weekend to obtain her consent to transition him to PEG tube. His wife was concered about this and she wanted to  wait to proceed with the tube.  His wife requested that he not go for [name] PEG yet.  He went on Monday and got the PEG despite her concerns - although the wife did sign the consent on Saturday.  His wife is now concerned that he has [name] PEG; getting 3 hours of therapy services; pain due to the PEG - so he was not meeting goals of therapy and that "all these setbacks" may be the reason.  Last week, Monday/Tuesday he was doing well with PT.  Wednesday, he was excessively fatigued and had difficulty participating in therapy.  BP 90/50.  His wife was then concerned about excessive medication for hypertension.  His wife was then concerned that he was started on new medications not discussed with her ("Ritalin, Baclofen, Vasopressin").   She had not spoken with Dr. Letta Pate and so was again frustrated.  His wife wanted to [name] all of his medications since he had not previously been on many medications at home.  Friday evening, his wife noticed that he felt warm; the weekend nurse was concerned and sent him for Mendi Constable CXR.  He was started on antibiotics for possible aspiration and was started on respiratory toilet.  Monday, his wife was concerned that she still had not met Dr. Letta Pate.  She felt that the antibiotics were working.  She wants to ensure that he has mouth care q2h due to h/o head/neck cancer.  SW last week asked for her top 3 choices for SNF last week; they were expecting to stay in rehab until 8/21.  Today, she was informed that [facility] will only pay through 8/16 and then will need to go to another facility.  She feels like he is wasting his rehab days while he has an acute illness.  His wife reached out to the ENT and Dr. Harrington Challenger; she thought maybe he needed to go to [facility] or to the acute side.  SW today told her that maybe he isn't stable for transport to [name] at this time related to his PNA.  She just wants the best for him and she just hates that he's going through this.  She is "realistic in knowing that he hasn't made any gains with PT, but he has had every set back possible".     Assessment & Plan:   Active Problems:   Pneumonia due to infectious organism   Malnutrition of moderate degree   Copious oral secretions   Goals of care, counseling/discussion   Palliative care by specialist   HCAP  Acute Hypoxic Respiratory Failure:  CXR from 8/10 with bibasilar pneumonia worse on R.  Repeat CXR 8/15 with worsening of bilateral R greater than L lower lung pneumonia.  Admitted to acute care from rehab on 8/15 for worsening pneumonia.  Cefepime 8/10 - present (will stop after 10 days) Will add flagyl for possible aspiration (8/15 - present) (will stop after 7 days) Follow repeat MRSA PCR (negative - will hold off on MRSA  coverage at this time) Follow urine strep.  Sputum ordered, but unlikely to be able to obtain. No blood cultures obtained.  Will hold off on obtaining cx at this time unless pt develops fever or WBC improved today. 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  Now NPO after stroke.  S/p PEG placement.  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 2/2 above, continue to follow, may need to broaden abx.  Consider culturing if change in status or worsening.  Anemia:  H/H stable, negative hemoccult, follow. Stable.  Hypernatremia: improved, watch with decreased free water  CKD III: unclear baseline, currently better than recent baseline, follow (fluctuating, follow).  Apparently follows with renal as outpatinet.  Nasopharyngeal cancer s/p radiation: follows with ENT  HLD: finofibrate, fish oil, lipitor  T2DM: SSI  Hypertension: continue norvasc 10.  HTNsive emergency on initial presentation. ** avoid IV hydralazine as pt with hypotension with this  Goals of Care:  Discussed with daughter and mother over phone on 8/15.  Recommended palliative care consult to discuss goals of care.  Discussed poor prognosis with his recent hemorrhagic stroke and now  likely aspiration pneumonia.  Family agreeable to discuss with palliative care.  See above for many family concerns regarding his course up until this point. - planning for meeting on Monday - robinul 0.2 mg q4 hrs prn for secretions and scheduled q12  DVT prophylaxis: SCD's due to bleeding risk with ICH Code Status: DNR Family Communication: wife 8/18 Disposition Plan: pending   Consultants:   Taking over from Rehab  Procedures:   PEG on 7/29 Study Conclusions  - Left ventricle: The cavity size was normal. Systolic function was   vigorous. The estimated ejection fraction was in the range of 65%   to 70%. Wall motion was normal; there were no regional wall   motion abnormalities. Left ventricular diastolic function   parameters were normal. - Aortic valve: Trileaflet; moderately thickened, moderately   calcified leaflets. Transvalvular velocity was within the normal   range. There was no stenosis. There was no regurgitation. - Mitral valve: There was no regurgitation. - Right ventricle: The cavity size was normal. Wall thickness was   normal. Systolic function was normal. - Tricuspid valve: There was no regurgitation. - Pulmonary arteries: Systolic pressure could not be accurately   estimated.  Impressions:  - Limited study quality, hyperdynamic LVEF, aortic valve leaflets   are calcified, no stenosis, no source of embolism was seen.  Antimicrobials:  Anti-infectives (From admission, onward)   Start     Dose/Rate Route Frequency Ordered Stop   02/24/18 1000  ceFEPIme (MAXIPIME) 2 g in sodium chloride 0.9 % 100 mL IVPB     2 g 200 mL/hr over 30 Minutes Intravenous Every 24 hours 02/23/18 2243     02/23/18 2245  metroNIDAZOLE (FLAGYL) 50 mg/ml oral suspension 500 mg     500 mg Per Tube 3 times daily 02/23/18 2234       Subjective: Looks at me Seems like he's trying to speak, but does not say anything intelligible.  Objective: Vitals:   02/26/18 0807 02/26/18  1201 02/26/18 1500 02/26/18 1657  BP: 105/60 117/62  121/69  Pulse: 77 72  73  Resp: 16 (!) 21  (!) 21  Temp: (!) 97.4  F (36.3 C) (!) 97.5 F (36.4 C)  98.3 F (36.8 C)  TempSrc: Axillary Axillary  Axillary  SpO2: 99% 95% 96% 97%  Weight:      Height:        Intake/Output Summary (Last 24 hours) at 02/26/2018 1709 Last data filed at 02/25/2018 1800 Gross per 24 hour  Intake 180 ml  Output -  Net 180 ml   Filed Weights   02/24/18 1454  Weight: 60.2 kg    Examination:  General: No acute distress. Cardiovascular: Heart sounds show Cohen Boettner regular rate, and rhythm. Lungs: Clear to auscultation bilaterally with good air movement. Abdomen: Soft, nontender, nondistended.  Peg tube.   Neurological: Alert and disoriented.  R sided weakness. Skin: Warm and dry. No rashes or lesions. Extremities: No clubbing or cyanosis. No edema   Data Reviewed: I have personally reviewed following labs and imaging studies  CBC: Recent Labs  Lab 02/20/18 0600 02/23/18 1505 02/24/18 0434 02/25/18 0547 02/26/18 0324  WBC 7.6 11.4* 9.5 8.3 7.3  HGB 9.5* 9.5* 10.4* 9.7* 9.3*  HCT 29.7* 30.4* 33.7* 31.5* 29.9*  MCV 80.7 81.9 82.2 80.8 81.7  PLT 301 349 389 386 643   Basic Metabolic Panel: Recent Labs  Lab 02/20/18 0600 02/23/18 1505 02/24/18 0434 02/25/18 0547 02/26/18 0324  NA 135 137 138 138 137  K 4.5 4.6 4.3 4.3 4.4  CL 98 100 101 99 99  CO2 '26 27 29 27 30  '$ GLUCOSE 148* 183* 166* 151* 146*  BUN 48* 52* 49* 46* 52*  CREATININE 1.77* 1.62* 1.59* 1.54* 1.75*  CALCIUM 9.0 9.2 9.1 9.1 8.8*  MG  --   --  2.4 2.0 2.3   GFR: Estimated Creatinine Clearance: 33.4 mL/min (Takara Sermons) (by C-G formula based on SCr of 1.75 mg/dL (H)). Liver Function Tests: Recent Labs  Lab 02/24/18 0434 02/25/18 0547  AST 35 35  ALT 37 38  ALKPHOS 76 66  BILITOT 0.3 0.3  PROT 6.6 6.6  ALBUMIN 2.1* 2.0*   No results for input(s): LIPASE, AMYLASE in the last 168 hours. No results for input(s): AMMONIA in  the last 168 hours. Coagulation Profile: No results for input(s): INR, PROTIME in the last 168 hours. Cardiac Enzymes: No results for input(s): CKTOTAL, CKMB, CKMBINDEX, TROPONINI in the last 168 hours. BNP (last 3 results) No results for input(s): PROBNP in the last 8760 hours. HbA1C: No results for input(s): HGBA1C in the last 72 hours. CBG: Recent Labs  Lab 02/26/18 0319 02/26/18 0540 02/26/18 0804 02/26/18 1130 02/26/18 1628  GLUCAP 133* 180* 183* 193* 177*   Lipid Profile: No results for input(s): CHOL, HDL, LDLCALC, TRIG, CHOLHDL, LDLDIRECT in the last 72 hours. Thyroid Function Tests: No results for input(s): TSH, T4TOTAL, FREET4, T3FREE, THYROIDAB in the last 72 hours. Anemia Panel: No results for input(s): VITAMINB12, FOLATE, FERRITIN, TIBC, IRON, RETICCTPCT in the last 72 hours. Sepsis Labs: Recent Labs  Lab 02/24/18 0434 02/25/18 0547  PROCALCITON 0.29 0.21    Recent Results (from the past 240 hour(s))  MRSA PCR Screening     Status: None   Collection Time: 02/23/18  5:59 PM  Result Value Ref Range Status   MRSA by PCR NEGATIVE NEGATIVE Final    Comment:        The GeneXpert MRSA Assay (FDA approved for NASAL specimens only), is one component of Allecia Bells comprehensive MRSA colonization surveillance program. It is not intended to diagnose MRSA infection nor to guide or monitor treatment for MRSA infections.  Performed at [facility], [place] [address] [place], [place] [address]          Radiology Studies: Dg Chest Port 1 View  Result Date: 02/25/2018 CLINICAL DATA:  Pneumonia. EXAM: PORTABLE CHEST 1 VIEW COMPARISON:  02/23/2018 FINDINGS: Airspace opacification of the right mid lung and right base with slight improvement. Improving medial left basilar opacification. Cardiomediastinal silhouette and remainder the exam is unchanged. IMPRESSION: Improving bilateral multifocal airspace process likely improving pneumonia. Electronically Signed   By: Marin Olp M.D.   On: 02/25/2018 16:22        Scheduled Meds: . amLODipine  10 mg Oral Daily  . atorvastatin  20 mg Oral q1800  . chlorhexidine  15 mL Mouth Rinse BID  . diclofenac sodium  2 g Topical QID  . feeding supplement (PRO-STAT SUGAR FREE 64)  30 mL Per Tube BID  . fenofibrate  160 mg Oral Daily  . free water  100 mL Per Tube Q3H  . glycopyrrolate  0.2 mg Intravenous Q12H  . insulin aspart  0-9 Units Subcutaneous Q4H  . mouth rinse  15 mL Mouth Rinse Q2H  . methylphenidate  5 mg Per Tube BID WC  . metoprolol tartrate  25 mg Per Tube BID  . metroNIDAZOLE  500 mg Per Tube TID  . multivitamin  15 mL Per Tube Daily   Continuous Infusions: . ceFEPime (MAXIPIME) IV 2 g (02/26/18 1158)  . feeding supplement (JEVITY 1.5 CAL/FIBER) 1,000 mL (02/26/18 1637)     LOS: 3 days    Time spent: over 30 min MDM moderate with multiple medical issues    Fayrene Helper, MD [facility] Pager [phone]  If 7PM-7AM, please contact night-coverage [email or web address] Password [facility] 02/26/2018, 5:09 PM

## 2018-02-26 NOTE — Progress Notes (Signed)
Patient CPT done and NTS done with moderate amount of secretions removed. Not able to get into the lung but in approaching patient would cough and a moderate amount of thick brown/yellow/tan secretions were being suctioned out. Immediately upon completion, patient sounded exactly the same. Patient is in no distress with rhonchi throughout but looks very uncomfortable during suctioning. Tolerated oral deep suctioning just as well as NTS.

## 2018-02-26 NOTE — Progress Notes (Signed)
Pharmacy Antibiotic Note  Adam Yang is a 72 y.o. male admitted on 02/23/2018 from inpatient rehab with worsening pneumonia.  Pharmacy has been consulted for Cefepime dosing.  WBC down to wnl. SCr up to 1.75 with estimated CrCl ~33 mL/min.  Currently requiring 3.5L . Remains afebrile.   Plan: Continue Cefepime 2 g IV q24h Maintains on Flagyl 500 mg IV q8h  Temp (24hrs), Avg:98 F (36.7 C), Min:97.4 F (36.3 C), Max:98.4 F (36.9 C)  Recent Labs  Lab 02/20/18 0600 02/23/18 1505 02/24/18 0434 02/25/18 0547 02/26/18 0324  WBC 7.6 11.4* 9.5 8.3 7.3  CREATININE 1.77* 1.62* 1.59* 1.54* 1.75*    Estimated Creatinine Clearance: 33.4 mL/min (A) (by C-G formula based on SCr of 1.75 mg/dL (H)).    No Known Allergies  Sloan Leiter, PharmD, BCPS, BCCCP Clinical Pharmacist Clinical phone 02/26/2018 until 3:30PM- [phone] After hours, please call #[phone] 02/26/2018 8:50 AM

## 2018-02-27 ENCOUNTER — Inpatient Hospital Stay (HOSPITAL_COMMUNITY): Payer: Medicare Other | Admitting: Physical Therapy

## 2018-02-27 LAB — GLUCOSE, CAPILLARY
GLUCOSE-CAPILLARY: 115 mg/dL — AB (ref 70–99)
GLUCOSE-CAPILLARY: 177 mg/dL — AB (ref 70–99)
GLUCOSE-CAPILLARY: 231 mg/dL — AB (ref 70–99)
GLUCOSE-CAPILLARY: 72 mg/dL (ref 70–99)
GLUCOSE-CAPILLARY: 99 mg/dL (ref 70–99)
Glucose-Capillary: 174 mg/dL — ABNORMAL HIGH (ref 70–99)
Glucose-Capillary: 149 mg/dL — ABNORMAL HIGH (ref 70–99)
Glucose-Capillary: 192 mg/dL — ABNORMAL HIGH (ref 70–99)

## 2018-02-27 LAB — COMPREHENSIVE METABOLIC PANEL
ALT: 43 U/L (ref 0–44)
ANION GAP: 9 (ref 5–15)
AST: 53 U/L — ABNORMAL HIGH (ref 15–41)
Albumin: 2.2 g/dL — ABNORMAL LOW (ref 3.5–5.0)
Alkaline Phosphatase: 56 U/L (ref 38–126)
BUN: 54 mg/dL — ABNORMAL HIGH (ref 8–23)
CHLORIDE: 99 mmol/L (ref 98–111)
CO2: 30 mmol/L (ref 22–32)
Calcium: 9 mg/dL (ref 8.9–10.3)
Creatinine, Ser: 1.66 mg/dL — ABNORMAL HIGH (ref 0.61–1.24)
GFR, EST AFRICAN AMERICAN: 47 mL/min — AB (ref >60)
GFR, EST NON AFRICAN AMERICAN: 40 mL/min — AB (ref >60)
Glucose, Bld: 211 mg/dL — ABNORMAL HIGH (ref 70–99)
Potassium: 4.2 mmol/L (ref 3.5–5.1)
SODIUM: 138 mmol/L (ref 135–145)
Total Bilirubin: 0.3 mg/dL (ref 0.3–1.2)
Total Protein: 6.3 g/dL — ABNORMAL LOW (ref 6.5–8.1)

## 2018-02-27 LAB — CBC
HCT: 32.1 % — ABNORMAL LOW (ref 39.0–52.0)
Hemoglobin: 9.7 g/dL — ABNORMAL LOW (ref 13.0–17.0)
MCH: 25.1 pg — AB (ref 26.0–34.0)
MCHC: 30.2 g/dL (ref 30.0–36.0)
MCV: 83.2 fL (ref 78.0–100.0)
Platelets: 387 10*3/uL (ref 150–400)
RBC: 3.86 MIL/uL — ABNORMAL LOW (ref 4.22–5.81)
RDW: 15.3 % (ref 11.5–15.5)
WBC: 8.3 10*3/uL (ref 4.0–10.5)

## 2018-02-27 LAB — MAGNESIUM: MAGNESIUM: 2.2 mg/dL (ref 1.7–2.4)

## 2018-02-27 MED ORDER — HYDROCORTISONE 1 % EX CREA
TOPICAL_CREAM | Freq: Two times a day (BID) | CUTANEOUS | Status: DC | PRN
  Administered 2018-02-27: 17:00:00 via TOPICAL
  Filled 2018-02-27: qty 28

## 2018-02-27 MED ORDER — DIPHENHYDRAMINE HCL 12.5 MG/5ML PO ELIX: 12.5000 mg | ORAL_SOLUTION | Freq: Four times a day (QID) | ORAL | Status: DC | PRN

## 2018-02-27 MED ORDER — INSULIN GLARGINE 100 UNIT/ML ~~LOC~~ SOLN
5.0000 [IU] | Freq: Every day | SUBCUTANEOUS | Status: DC
  Administered 2018-02-27: 5 [IU] via SUBCUTANEOUS
  Filled 2018-02-27: qty 0.05

## 2018-02-27 MED ORDER — DIPHENHYDRAMINE HCL 12.5 MG/5ML PO ELIX
12.5000 mg | ORAL_SOLUTION | Freq: Four times a day (QID) | ORAL | Status: DC | PRN
  Administered 2018-02-27: 12.5 mg
  Filled 2018-02-27: qty 10

## 2018-02-27 MED ORDER — PREDNISONE 20 MG PO TABS
40.0000 mg | ORAL_TABLET | Freq: Every day | ORAL | Status: DC
  Administered 2018-02-27 – 2018-02-28 (×2): 40 mg via ORAL
  Filled 2018-02-27 (×2): qty 2

## 2018-02-27 MED ORDER — DIPHENHYDRAMINE HCL 12.5 MG/5ML PO ELIX
25.0000 mg | ORAL_SOLUTION | Freq: Four times a day (QID) | ORAL | Status: DC | PRN
  Administered 2018-02-28 – 2018-03-01 (×2): 25 mg
  Filled 2018-02-27 (×2): qty 10

## 2018-02-27 NOTE — Care Management Note (Signed)
Case Management Note  Patient Details  Name: Adam Yang MRN: [id number] Date of Birth: [birth year]  Subjective/Objective:        Pt admitted with pneumonia. He came from CIR. Pt has spouse.  PCP: Dr Harrington Challenger Insurance: [employer] medicare            Action/Plan: Recommendations are for SNF. CM following for d/c disposition.   Expected Discharge Date:                  Expected Discharge Plan:  Skilled Nursing Facility  In-House Referral:  Clinical Social Work  Discharge planning Services     Post Acute Care Choice:    Choice offered to:     DME Arranged:    DME Agency:     HH Arranged:    [place] Agency:     Status of Service:  In process, will continue to follow  If discussed at Long Length of Stay Meetings, dates discussed:    Additional Comments:  Pollie Friar, RN 02/27/2018, 1:42 PM

## 2018-02-27 NOTE — Progress Notes (Signed)
Pt developed red rash on back, lower stomach, and right upper leg. MD notified. Will continue to monitor

## 2018-02-27 NOTE — Progress Notes (Signed)
CPT done manually while pt in chair. Pt had a dry NPC. Pt tol well.

## 2018-02-27 NOTE — Progress Notes (Signed)
Patient NTS done with good results. Suctioned for moderate amount of thick tenacious yellow/tan/pink tinged secretions. Patient fought through procedure and wouldn't let us clear his secretions from his mouth and throat. Patient Current;y 93% Sats with Hr 89 on 2L [place].

## 2018-02-27 NOTE — Care Management Important Message (Signed)
Important Message  Patient Details  Name: Adam Yang MRN: [id number] Date of Birth: 05-08-1946   Medicare Important Message Given:  Yes  Correction:Please disregard signature as it was not the patient .  Wrong signature  done in error Reggie Bise 02/27/2018, 3:44 PM

## 2018-02-27 NOTE — [place] FL2 (Signed)
[place] MEDICAID FL2 LEVEL OF CARE SCREENING TOOL     IDENTIFICATION  Patient Name: Adam Yang Birthdate: June 02, 1946 Sex: male Admission Date (Current Location): 02/23/2018  [facility] and [place] Number:  Herbalist and Address:  [facility], [place] [address] [place], [place] [address]      Provider Number: [id number]  Attending Physician Name and Address:  Elodia Florence., *  Relative Name and Phone Number:       Current Level of Care: Hospital Recommended Level of Care: [place] Prior Approval Number:    Date Approved/Denied:   PASRR Number: [id number]  Discharge Plan: SNF    Current Diagnoses: Patient Active Problem List   Diagnosis Date Noted  . Copious oral secretions   . Goals of care, counseling/discussion   . Palliative care by specialist   . Malnutrition of moderate degree 02/24/2018  . Pneumonia of both lungs due to infectious organism 02/23/2018  . Pneumonia due to infectious organism 02/23/2018  . Multifocal pneumonia 02/22/2018  . Aspiration pneumonia of right lower lobe due to milk ([place])   . Dysphasia   . Anemia of chronic disease   . Hypernatremia   . Transaminitis   . Hypertensive emergency 02/02/2018  . Intraparenchymal hemorrhage of brain ([place]) 02/02/2018  . Hemiparesis affecting right side as late effect of stroke ([place])   . Aphasia, post-stroke   . Dysphagia, post-stroke   . Nontraumatic subcortical hemorrhage of left cerebral hemisphere ([place])   . Diabetes mellitus type 2 in nonobese (HCC)   . AKI (acute kidney injury) ([place])   . Hyponatremia   . Chronic kidney disease   . Cytotoxic brain edema ([place]) 01/28/2018  . Brain herniation ([place]) 01/28/2018  . IVH (intraventricular hemorrhage) ([place]) 01/27/2018  . Right inguinal hernia 08/23/2017  . Chronic kidney disease, stage III (moderate) ([place]) 02/18/2017  . 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 Other fatigue 06/18/2016  . Essential hypertension 10/17/2015  . Mucositis due to chemotherapy 01/24/2015  . Hyperglycemia due to type 2 diabetes mellitus ([place]) 01/07/2015  . Chronic anxiety 12/19/2014  . Stage 2 chronic renal impairment associated with type 2 diabetes mellitus ([place]) 12/19/2014  . Type 2 diabetes mellitus, controlled, with renal complications ([place]) 35/00/9381  . History of nasopharyngeal cancer 11/26/2014    Orientation RESPIRATION BLADDER Height & Weight     Self  O2([place] 2L) Incontinent Weight: 132 lb 11.5 oz (60.2 kg)(02/22/18) Height:  5\' 9"  (175.3 cm)(02/02/18)  BEHAVIORAL SYMPTOMS/MOOD NEUROLOGICAL BOWEL NUTRITION STATUS      Incontinent Feeding tube  AMBULATORY STATUS COMMUNICATION OF NEEDS Skin   Extensive Assist Verbally Other (Comment)(sacrum wound, no dressing)                       Personal Care Assistance Level of Assistance  Bathing, Feeding, Dressing Bathing Assistance: Maximum assistance Feeding assistance: Maximum assistance Dressing Assistance: Maximum assistance     Functional Limitations Info  Sight, Hearing, Speech Sight Info: Impaired Hearing Info: Adequate Speech Info: Impaired    SPECIAL CARE FACTORS FREQUENCY  PT (By licensed PT), OT (By licensed OT), Speech therapy, Bowel and bladder program     PT Frequency: 5x/wk OT Frequency: 5x/wk Bowel and Bladder Program Frequency: timed toileting every 2-3 hours   Speech Therapy Frequency: 5x/wk      Contractures Contractures Info: Not present    Additional Factors Info  Code Status, Allergies, Psychotropic,  Insulin Sliding Scale Code Status Info: DNR Allergies Info: NKA Psychotropic Info: Ritalin 5mg  2x/day, once at breakfast and once at lunch Insulin Sliding Scale Info: 0-9 units every 4 hours       Current Medications (02/27/2018):  This is the current hospital active medication list Current Facility-Administered Medications  Medication Dose Route Frequency  Provider Last Rate Last Dose  . acetaminophen (TYLENOL) solution 650 mg  650 mg Oral Q6H PRN Elodia Florence., MD   650 mg at 02/24/18 1547  . amLODipine (NORVASC) tablet 10 mg  10 mg Oral Daily Elodia Florence., MD   10 mg at 02/27/18 6144  . artificial tears (LACRILUBE) ophthalmic ointment 1 application  1 application Both Eyes R1V PRN Elodia Florence., MD      . atorvastatin (LIPITOR) tablet 20 mg  20 mg Oral q1800 Elodia Florence., MD   20 mg at 02/26/18 1810  . chlorhexidine (PERIDEX) 0.12 % solution 15 mL  15 mL Mouth Rinse BID Elodia Florence., MD   15 mL at 02/27/18 0921  . diclofenac sodium (VOLTAREN) 1 % transdermal gel 2 g  2 g Topical QID Elodia Florence., MD   2 g at 02/26/18 2304  . feeding supplement (JEVITY 1.5 CAL/FIBER) liquid 1,000 mL  1,000 mL Per Tube Continuous Elodia Florence., MD 50 mL/hr at 02/26/18 1637 1,000 mL at 02/26/18 1637  . feeding supplement (PRO-STAT SUGAR FREE 64) liquid 30 mL  30 mL Per Tube BID Elodia Florence., MD   30 mL at 02/27/18 0918  . fenofibrate tablet 160 mg  160 mg Oral Daily Elodia Florence., MD   160 mg at 02/27/18 0920  . free water 100 mL  100 mL Per Tube Q3H Elodia Florence., MD   100 mL at 02/27/18 0455  . glycopyrrolate (ROBINUL) injection 0.2 mg  0.2 mg Intravenous Q4H PRN Elodia Florence., MD      . glycopyrrolate [facility]) injection 0.2 mg  0.2 mg Intravenous Q12H Bullard, Elray Mcgregor, NP   0.2 mg at 02/27/18 0918  . insulin aspart (novoLOG) injection 0-9 Units  0-9 Units Subcutaneous Q4H Elodia Florence., MD   1 Units at 02/27/18 1206  . MEDLINE mouth rinse  15 mL Mouth Rinse Q2H Elodia Florence., MD   15 mL at 02/27/18 0455  . methylphenidate (RITALIN) tablet 5 mg  5 mg Per Tube BID WC Elodia Florence., MD   5 mg at 02/27/18 1216  . metoprolol tartrate (LOPRESSOR) tablet 25 mg  25 mg Per Tube BID Elodia Florence., MD   25 mg at 02/27/18 4008  .  metroNIDAZOLE (FLAGYL) 50 mg/ml oral suspension 500 mg  500 mg Per Tube TID Elodia Florence., MD   500 mg at 02/27/18 0920  . multivitamin liquid 15 mL  15 mL Per Tube Daily Elodia Florence., MD   15 mL at 02/27/18 6761  . tiZANidine (ZANAFLEX) tablet 4 mg  4 mg Oral QHS PRN Elodia Florence., MD         Discharge Medications: Please see discharge summary for a list of discharge medications.  Relevant Imaging Results:  Relevant Lab Results:   Additional Information SS#: [id number]  Geralynn Ochs, LCSW

## 2018-02-27 NOTE — Progress Notes (Signed)
PROGRESS NOTE    Adam Yang  YDX:[id number] DOB: Oct 10, 1945 DOA: 02/23/2018 PCP: Lawerance Cruel, MD   Brief Narrative:  Per previous consult note Adam Yang is an 72 y.o. male with past medical history of hypertension, diabetes, hyperlipidemia past radiation for nasopharynx cancer who presented on 7/18 for stroke-like symptoms, found to have left frontoparietal bleed.  He was unable to obtain an MRI when his HR dropped into the 30s repeatedly.  He had severe HTN and was admitted to the ICU.  He had resultant global aphasia, right hemiplegia, and right neglect.  He improved well enough to be appropriate for transfer to CIR.  On 7/25, he was found to be unresponsive with Ludell Zacarias BP of 76/48.  Patient had been hypertensive to 188/100 and was given 20 mg hydralazine with resultant BP 68/39 and unresponsive 30 minutes later.  He was given IVF bolus and had improved BP to 149/73 with increased mental status.  He was transferred to CIR later that day.  On 8/10, he was found to have bibasilar PNA, R>L.  He was started on Cefepime.  He has had minimal progression in CIR and so is recommended to go to SNF based on review of rehab notes.   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  He had some episodes where he would go outside and feel presyncopal.  His BUN/Creatinine levels were elevated, and he was sent to nephrology in April - but there was no good answer for what was wrong with the kidneys.  Has been on HTN medications for about 20 years.  He had been off Norvasc but it was added back over the last year.  He was also taking fenofibrate, fish oil but really no other medications.  He was not on ASA and had no h/o CAD.  7/18, he had Marelyn Rouser CVA.  He had Giovanna Kemmerer significant hemorrhage, not on anticoagulation.  He was treated in the ICU here and treated with permissive HTN.  He was able to try to talk and communicate.  He was transferred out of ICU to the stroke unit.   They felt that he would be [name] good candidate for rehab.  He was interacting and recognizing people but unable to verbalize.  He did have [name] dense right hemiparesis.  He was transferred to rehab.  He seems to have been downhill since arriving in rehab.  He came over Thursday evening, 7/25.  The night before transfer, his BP dropped to 70/40 and yet he was still transferred; she is concerned that this might have been premature.  Friday, they introduced the therapy plan.  He fell out of bed on Friday. He was moving his legs and active, "typical stroke, he thought he could do it."  He got Quenna Doepke telesitter at that time.  Since then, he has seemed weaker.  Last week, SW told them this unit is for intense rehab, may need to think about long-range plans.  His family was considering bringing him home.  He seemed to be really trying with PT/OT.  He had an NG tube and he was getting accuchecks q4h - he previously had [name] PEG tube during his remote cancer and his wife was concerned about going that route again.  She requested that he be changed to Glucerna and not continue SSI.  Radiology came over the weekend to obtain her consent to transition him to PEG tube. His wife was concered about this and she wanted to  wait to proceed with the tube.  His wife requested that he not go for [name] PEG yet.  He went on Monday and got the PEG despite her concerns - although the wife did sign the consent on Saturday.  His wife is now concerned that he has [name] PEG; getting 3 hours of therapy services; pain due to the PEG - so he was not meeting goals of therapy and that "all these setbacks" may be the reason.  Last week, Monday/Tuesday he was doing well with PT.  Wednesday, he was excessively fatigued and had difficulty participating in therapy.  BP 90/50.  His wife was then concerned about excessive medication for hypertension.  His wife was then concerned that he was started on new medications not discussed with her ("Ritalin, Baclofen, Vasopressin").   She had not spoken with Dr. Letta Pate and so was again frustrated.  His wife wanted to [name] all of his medications since he had not previously been on many medications at home.  Friday evening, his wife noticed that he felt warm; the weekend nurse was concerned and sent him for Elham Fini CXR.  He was started on antibiotics for possible aspiration and was started on respiratory toilet.  Monday, his wife was concerned that she still had not met Dr. Letta Pate.  She felt that the antibiotics were working.  She wants to ensure that he has mouth care q2h due to h/o head/neck cancer.  SW last week asked for her top 3 choices for SNF last week; they were expecting to stay in rehab until 8/21.  Today, she was informed that Adam Yang will only pay through 8/16 and then will need to go to another facility.  She feels like he is wasting his rehab days while he has an acute illness.  His wife reached out to the ENT and Dr. Harrington Challenger; she thought maybe he needed to go to [facility] or to the acute side.  SW today told her that maybe he isn't stable for transport to [facility] at this time related to his PNA.  She just wants the best for him and she just hates that he's going through this.  She is "realistic in knowing that he hasn't made any gains with PT, but he has had every set back possible".     Assessment & Plan:   Active Problems:   Pneumonia due to infectious organism   Malnutrition of moderate degree   Copious oral secretions   Goals of care, counseling/discussion   Palliative care by specialist   HCAP  Acute Hypoxic Respiratory Failure:  CXR from 8/10 with bibasilar pneumonia worse on R.  Repeat CXR 8/15 with worsening of bilateral R greater than L lower lung pneumonia.  Admitted to acute care from rehab on 8/15 for worsening pneumonia.  CXR 8/17 with improving bilateral multifocal airspace process Cefepime 8/10 - 8/19 Will add flagyl for possible aspiration (8/15 - 8/19) (will stop 8/19 given rash) Follow repeat MRSA PCR  (negative - will hold off on MRSA coverage at this time) Follow urine strep.  Sputum ordered, but unlikely to be able to obtain. No blood cultures obtained.  Will hold off on obtaining cx at this time unless pt develops fever or WBC improved today. 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  Now NPO after stroke.  S/p PEG  placement.  Discussed with dietician for decreasing volume of feeds Glucerna 1.2 cal at 75 ml/hr -> jevity 1.5 50 ml/hr Decreased to 100 cc q 3 hrs free water Prostat 30 ml daily Try glycopyrrolate for secretions Will check residuals and follow (residuals noted to be ~40 this AM, follow)  Urticarial Rash: unclear cause, tech states she noticed yesterday.  Wife says she thinks she noticed beginnings of rash on Saturday?  Medications vs contact?  He's had his last dose of cefepime this AM, will stop flagyl (I believe these are the newest meds based on my chart review, both have been added to allergy list, though Duvid Smalls bit unclear - glycopyrollate can cause rash in <2%, will continue for now).   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 - s/p peg on 7/29 - Had been transferred to inpatient rehab, but not making any significant progress per discussion with MD  - continue PT/OT/SLP  Leukocytosis:  improved  Anemia:  H/H stable, negative hemoccult, follow. Stable.  Hypernatremia: improved, watch with decreased free water, stable.  CKD III: unclear baseline, currently better than recent baseline, follow (fluctuating, follow).  Apparently follows with renal as  outpatinet.  Nasopharyngeal cancer s/p radiation: follows with ENT  HLD: finofibrate, fish oil, lipitor  T2DM: SSI.  Will start 5 units of lantus with steroids above.  Hypertension: continue norvasc 10.  HTNsive emergency on initial presentation. ** avoid IV hydralazine as pt with hypotension with this  Goals of Care:  Discussed with daughter and mother over phone on 8/15.  Recommended palliative care consult to discuss goals of care.  Discussed poor prognosis with his recent hemorrhagic stroke and now likely aspiration pneumonia.  Family agreeable to discuss with palliative care.  See above for many family concerns regarding his course up until this point. - planning for meeting on Monday - robinul 0.2 mg q4 hrs prn for secretions and scheduled q12  DVT prophylaxis: SCD's due to bleeding risk with ICH Code Status: DNR Family Communication: wife 8/19 Disposition Plan: pending SNF placement vs LTACH (pt wife asking about select vs LTACH), discussing with SW and CM   Consultants:   Taking over from Rehab  Procedures:   PEG on 7/29 Study Conclusions  - Left ventricle: The cavity size was normal. Systolic function was   vigorous. The estimated ejection fraction was in the range of 65%   to 70%. Wall motion was normal; there were no regional wall   motion abnormalities. Left ventricular diastolic function   parameters were normal. - Aortic valve: Trileaflet; moderately thickened, moderately   calcified leaflets. Transvalvular velocity was within the normal   range. There was no stenosis. There was no regurgitation. - Mitral valve: There was no regurgitation. - Right ventricle: The cavity size was normal. Wall thickness was   normal. Systolic function was normal. - Tricuspid valve: There was no regurgitation. - Pulmonary arteries: Systolic pressure could not be accurately   estimated.  Impressions:  - Limited study quality, hyperdynamic LVEF, aortic valve leaflets   are  calcified, no stenosis, no source of embolism was seen.  Antimicrobials:  Anti-infectives (From admission, onward)   Start     Dose/Rate Route Frequency Ordered Stop   02/24/18 1000  ceFEPIme (MAXIPIME) 2 g in sodium chloride  0.9 % 100 mL IVPB     2 g 200 mL/hr over 30 Minutes Intravenous Every 24 hours 02/23/18 2243 02/27/18 1036   02/23/18 2245  metroNIDAZOLE (FLAGYL) 50 mg/ml oral suspension 500 mg     500 mg Per Tube 3 times daily 02/23/18 2234 03/02/18 2159     Subjective: Looks at me Nods yes and no to questions.  Objective: Vitals:   02/27/18 0751 02/27/18 0755 02/27/18 1207 02/27/18 1245  BP: (!) 162/86  127/68   Pulse: 100 90 77 71  Resp: _0 Temp: 98 F (36.7 C)  98.8 F (37.1 C)   TempSrc: Axillary  Oral   SpO2: 93% 93% 92% 98%  Weight:      Height:        Intake/Output Summary (Last 24 hours) at 02/27/2018 1528 Last data filed at 02/27/2018 0100 Gross per 24 hour  Intake 1000 ml  Output -  Net 1000 ml   Filed Weights   02/24/18 1454  Weight: 60.2 kg    Examination:  General: No acute distress. Cardiovascular: Heart sounds show Rand Etchison regular rate, and rhythm.  Lungs: Clear to auscultation bilaterally  Abdomen: Soft, nontender, nondistended.  PEG in place Neurological: Alert and oriented 3. R sided weakness.  Skin: urticarial rash across back and on stomach (no oral involvement appreciated) Extremities: No clubbing or cyanosis. No edema.    Data Reviewed: I have personally reviewed following labs and imaging studies  CBC: Recent Labs  Lab 02/23/18 1505 02/24/18 0434 02/25/18 0547 02/26/18 0324 02/27/18 0819  WBC 11.4* 9.5 8.3 7.3 8.3  HGB 9.5* 10.4* 9.7* 9.3* 9.7*  HCT 30.4* 33.7* 31.5* 29.9* 32.1*  MCV 81.9 82.2 80.8 81.7 83.2  PLT 349 389 386 372 983   Basic Metabolic Panel: Recent Labs  Lab 02/23/18 1505 02/24/18 0434 02/25/18 0547 02/26/18 0324 02/27/18 0819  NA 137 138 138 137 138  K 4.6 4.3 4.3 4.4 4.2  CL 100 101 99  99 99  CO2 _1 GLUCOSE 183* 166* 151* 146* 211*  BUN 52* 49* 46* 52* 54*  CREATININE 1.62* 1.59* 1.54* 1.75* 1.66*  CALCIUM 9.2 9.1 9.1 8.8* 9.0  MG  --  2.4 2.0 2.3 2.2   GFR: Estimated Creatinine Clearance: 35.3 mL/min (Jacey Pelc) (by C-G formula based on SCr of 1.66 mg/dL (H)). Liver Function Tests: Recent Labs  Lab 02/24/18 0434 02/25/18 0547 02/27/18 0819  AST 35 35 53*  ALT 37 38 43  ALKPHOS 76 66 56  BILITOT 0.3 0.3 0.3  PROT 6.6 6.6 6.3*  ALBUMIN 2.1* 2.0* 2.2*   No results for input(s): LIPASE, AMYLASE in the last 168 hours. No results for input(s): AMMONIA in the last 168 hours. Coagulation Profile: No results for input(s): INR, PROTIME in the last 168 hours. Cardiac Enzymes: No results for input(s): CKTOTAL, CKMB, CKMBINDEX, TROPONINI in the last 168 hours. BNP (last 3 results) No results for input(s): PROBNP in the last 8760 hours. HbA1C: No results for input(s): HGBA1C in the last 72 hours. CBG: Recent Labs  Lab 02/27/18 0123 02/27/18 0224 02/27/18 0344 02/27/18 0731 02/27/18 1112  GLUCAP 72 99 115* 174* 149*   Lipid Profile: No results for input(s): CHOL, HDL, LDLCALC, TRIG, CHOLHDL, LDLDIRECT in the last 72 hours. Thyroid Function Tests: No results for input(s): TSH, T4TOTAL, FREET4, T3FREE, THYROIDAB in the last 72 hours. Anemia Panel: No results for input(s): VITAMINB12, FOLATE, FERRITIN, TIBC, IRON, RETICCTPCT in the last  72 hours. Sepsis Labs: Recent Labs  Lab 02/24/18 0434 02/25/18 0547  PROCALCITON 0.29 0.21    Recent Results (from the past 240 hour(s))  MRSA PCR Screening     Status: None   Collection Time: 02/23/18  5:59 PM  Result Value Ref Range Status   MRSA by PCR NEGATIVE NEGATIVE Final    Comment:        The GeneXpert MRSA Assay (FDA approved for NASAL specimens only), is one component of Pauline Pegues comprehensive MRSA colonization surveillance program. It is not intended to diagnose MRSA infection nor to guide or monitor  treatment for MRSA infections. Performed at [facility], [place] [address], [place],  [address]          Radiology Studies: No results found.      Scheduled Meds: . amLODipine  10 mg Oral Daily  . atorvastatin  20 mg Oral q1800  . chlorhexidine  15 mL Mouth Rinse BID  . diclofenac sodium  2 g Topical QID  . feeding supplement (PRO-STAT SUGAR FREE 64)  30 mL Per Tube BID  . fenofibrate  160 mg Oral Daily  . free water  100 mL Per Tube Q3H  . glycopyrrolate  0.2 mg Intravenous Q12H  . insulin aspart  0-9 Units Subcutaneous Q4H  . mouth rinse  15 mL Mouth Rinse Q2H  . methylphenidate  5 mg Per Tube BID WC  . metoprolol tartrate  25 mg Per Tube BID  . metroNIDAZOLE  500 mg Per Tube TID  . multivitamin  15 mL Per Tube Daily   Continuous Infusions: . feeding supplement (JEVITY 1.5 CAL/FIBER) 1,000 mL (02/26/18 1637)     LOS: 4 days    Time spent: over 30 min MDM moderate with multiple medical issues    Fayrene Helper, MD [facility] Pager [phone]  If 7PM-7AM, please contact night-coverage [email or web address] Password [facility] 02/27/2018, 3:28 PM

## 2018-02-27 NOTE — Progress Notes (Signed)
Manual CPT done. Deep oral sxn done - small tan/pink thick. Pt tol well.

## 2018-02-27 NOTE — Progress Notes (Signed)
Physical Therapy Treatment Patient Details Name: Dina Mobley MRN: [id number] DOB: 07-Dec-1945 Today's Date: 02/27/2018    History of Present Illness Patient is a 72 y/o male admitted from [place] at [facility] due to HCAP with acute hypoxic respiratory failure. Of note, admitted on 7/18 for left frontoparietal bleed with resultant global aphasia, right hemiplegia, and right neglect. PMH significant for hypertension, diabetes, hyperlipidemia past radiation for nasopharynx cancer.    PT Comments    Patient doing well today - agreeable to PT session and for transfer OOB to recliner. Improved bed mobility today, but does require heavy assist for upright posturing to maintain midline as well as transfer to recliner due to heavy push from L to R side. Requires assistance for LE and trunk posturing throughout. Continue to recommend SNF.   Follow Up Recommendations  SNF;Supervision/Assistance - 24 hour     Equipment Recommendations  (TBD)    Recommendations for Other Services       Precautions / Restrictions Precautions Precautions: Fall Precaution Comments: R hemi, R inattention, pusher, global aphasia Restrictions Weight Bearing Restrictions: No    Mobility  Bed Mobility Overal bed mobility: Needs Assistance Bed Mobility: Rolling;Supine to Sit Rolling: Mod assist   Supine to sit: Max assist;+2 for physical assistance     General bed mobility comments: Mod A for rolling wiht good initiation from patient; Max A +2 for supine to sit for trunk control - heavy pushing  Transfers Overall transfer level: Needs assistance Equipment used: 2 person hand held assist Transfers: Stand Pivot Transfers;Sit to/from Stand Sit to Stand: Total assist;+2 physical assistance Stand pivot transfers: Total assist;+2 physical assistance       General transfer comment: total A +2 for transfers; little initiation from impaired side  Ambulation/Gait             General Gait Details:  deferred   Stairs             Wheelchair Mobility    Modified Rankin (Stroke Patients Only)       Balance Overall balance assessment: Needs assistance Sitting-balance support: Single extremity supported;Feet supported Sitting balance-Leahy Scale: Poor Sitting balance - Comments: pushes to R with stronger LUE   Standing balance support: Bilateral upper extremity supported;During functional activity Standing balance-Leahy Scale: Zero                              Cognition Arousal/Alertness: Awake/alert Behavior During Therapy: WFL for tasks assessed/performed Overall Cognitive Status: Difficult to assess                                 General Comments: able to answer yes/no questions 50% of the time      Exercises      General Comments        Pertinent Vitals/Pain Pain Assessment: Faces Faces Pain Scale: No hurt    Home Living                      Prior Function            PT Goals (current goals can now be found in the care plan section) Acute Rehab PT Goals Patient Stated Goal: none stated PT Goal Formulation: Patient unable to participate in goal setting Time For Goal Achievement: 03/10/18 Potential to Achieve Goals: Fair Progress towards PT goals: Progressing toward goals    Frequency  Min 2X/week      PT Plan Current plan remains appropriate    Co-evaluation              AM-PAC PT "6 Clicks" Daily Activity  Outcome Measure  Difficulty turning over in bed (including adjusting bedclothes, sheets and blankets)?: Unable Difficulty moving from lying on back to sitting on the side of the bed? : Unable Difficulty sitting down on and standing up from a chair with arms (e.g., wheelchair, bedside commode, etc,.)?: Unable Help needed moving to and from a bed to chair (including a wheelchair)?: Total Help needed walking in hospital room?: Total Help needed climbing 3-5 steps with a railing? : Total 6  Click Score: 6    End of Session Equipment Utilized During Treatment: Gait belt;Oxygen Activity Tolerance: Patient tolerated treatment well Patient left: in chair;with call bell/phone within reach;with chair alarm set;with family/visitor present;with nursing/sitter in room Nurse Communication: Mobility status PT Visit Diagnosis: Unsteadiness on feet (R26.81);Hemiplegia and hemiparesis Hemiplegia - Right/Left: Right Hemiplegia - dominant/non-dominant: Non-dominant Hemiplegia - caused by: Nontraumatic intracerebral hemorrhage     Time: 2694-8546 PT Time Calculation (min) (ACUTE ONLY): 23 min  Charges:  $Therapeutic Activity: 23-37 mins                     {Massai Hankerson Zerita Boers, PT, DPT 02/27/18 2:53 PM Pager: [phone]

## 2018-02-27 NOTE — Progress Notes (Signed)
SLP Cancellation Note  Patient Details Name: Adam Yang MRN: [id number] DOB: 1946/06/01   Cancelled treatment:        Unable to wake patient for therapy. Family not present, unable to provide further education.    Charlynne Cousins Daryle Boyington, MA, CCC-SLP 02/27/2018 9:36 AM

## 2018-02-27 NOTE — Progress Notes (Addendum)
Penny Burn,                                                                                                                                                                                                           Daily Progress Note   Patient Name: Rody Keadle       Date: 02/27/2018 DOB: 12-29-1945  Age: 72 y.o. MRN#: [id number] Attending Physician: Elodia Florence., * Primary Care Physician: Lawerance Cruel, MD Admit Date: 02/23/2018  Reason for Consultation/Follow-up: Psychosocial/spiritual support  Subjective: Spoke with Tye [place], patient' wife via phone. She states her conversation yesterday with palliative medicine was productive, and she does not feel a need to meet today. She states she received a call from the primary team, and it sounds like her husband is improving. Tye Maryland is out looking at facilities, and requests paperwork be sent to [facility] and [facility] as they are facilities she would be interested in. She states we need to get moving with him. Info relayed to SW for 3W at [facility] request.   Recommend outpatient palliative at D/C.   Length of Stay: 4  Current Medications: Scheduled Meds:  . amLODipine  10 mg Oral Daily  . atorvastatin  20 mg Oral q1800  . chlorhexidine  15 mL Mouth Rinse BID  . diclofenac sodium  2 g Topical QID  . feeding supplement (PRO-STAT SUGAR FREE 64)  30 mL Per Tube BID  . fenofibrate  160 mg Oral Daily  . free water  100 mL Per Tube Q3H  . glycopyrrolate  0.2 mg Intravenous Q12H  . insulin aspart  0-9 Units Subcutaneous Q4H  . mouth rinse  15 mL Mouth Rinse Q2H  . methylphenidate  5 mg Per Tube BID WC  . metoprolol tartrate  25 mg Per Tube BID  . metroNIDAZOLE  500 mg Per Tube TID  . multivitamin  15 mL Per Tube Daily    Continuous Infusions: . feeding supplement (JEVITY 1.5 CAL/FIBER) 1,000 mL (02/26/18 1637)    PRN Meds: acetaminophen (TYLENOL) oral liquid 160 mg/5 mL, artificial tears, glycopyrrolate,  tiZANidine  Physical Exam  Constitutional: No distress.  Pulmonary/Chest: Effort normal.  Neurological: He is alert.  Skin: Skin is warm and dry.            Vital Signs: BP (!) 162/86 (BP Location: Left Arm)   Pulse 90   Temp 98 F (36.7 C) (Axillary)   Resp 20   Ht 5\' 9"  (1.753 m) Comment: 02/02/18  Wt 60.2 kg Comment: 02/22/18  SpO2  93%   BMI 19.60 kg/m  SpO2: SpO2: 93 % O2 Device: O2 Device: Nasal Cannula O2 Flow Rate: O2 Flow Rate (L/min): 2 L/min  Intake/output summary:   Intake/Output Summary (Last 24 hours) at 02/27/2018 1127 Last data filed at 02/27/2018 0100 Gross per 24 hour  Intake 1000 ml  Output -  Net 1000 ml   LBM: Last BM Date: 02/25/18 Baseline Weight: Weight: 60.2 kg(02/22/18) Most recent weight: Weight: 60.2 kg(02/22/18)       Palliative Assessment/Data:    Flowsheet Rows     Most Recent Value  Intake Tab  Referral Department  Hospitalist  Unit at Time of Referral  Med/Surg Unit  Palliative Care Primary Diagnosis  Sepsis/Infectious Disease  Date Notified  02/24/18  Palliative Care Type  New Palliative care  Reason Not Seen  Discharged before seen  Reason for referral  Clarify Goals of Care, Psychosocial or Spiritual support  Date of Admission  02/23/18  Date first seen by Palliative Care  02/25/18  # of days Palliative referral response time  1 Day(s)  # of days IP prior to Palliative referral  1  Clinical Assessment  Palliative Performance Scale Score  40%  Pain Max last 24 hours  Not able to report  Pain Min Last 24 hours  Not able to report  Dyspnea Max Last 24 Hours  Not able to report  Dyspnea Min Last 24 hours  Not able to report  Nausea Max Last 24 Hours  Not able to report  Nausea Min Last 24 Hours  Not able to report  Anxiety Max Last 24 Hours  Not able to report  Anxiety Min Last 24 Hours  Not able to report  Other Max Last 24 Hours  Not able to report  Psychosocial & Spiritual Assessment  Palliative Care Outcomes   Patient/Family meeting held?  Yes  Who was at the meeting?  wife via phone  Palliative Care Outcomes  Provided psychosocial or spiritual support, Clarified goals of care  Patient/Family wishes: Interventions discontinued/not started   Mechanical Ventilation  Palliative Care follow-up planned  Yes, Facility      Patient Active Problem List   Diagnosis Date Noted  . Copious oral secretions   . Goals of care, counseling/discussion   . Palliative care by specialist   . Malnutrition of moderate degree 02/24/2018  . Pneumonia of both lungs due to infectious organism 02/23/2018  . Pneumonia due to infectious organism 02/23/2018  . Multifocal pneumonia 02/22/2018  . Aspiration pneumonia of right lower lobe due to milk ([place])   . Dysphasia   . Anemia of chronic disease   . Hypernatremia   . Transaminitis   . Hypertensive emergency 02/02/2018  . Intraparenchymal hemorrhage of brain ([place]) 02/02/2018  . Hemiparesis affecting right side as late effect of stroke ([place])   . Aphasia, post-stroke   . Dysphagia, post-stroke   . Nontraumatic subcortical hemorrhage of left cerebral hemisphere ([place])   . Diabetes mellitus type 2 in nonobese (HCC)   . AKI (acute kidney injury) ([place])   . Hyponatremia   . Chronic kidney disease   . Cytotoxic brain edema ([place]) 01/28/2018  . Brain herniation ([place]) 01/28/2018  . IVH (intraventricular hemorrhage) ([place]) 01/27/2018  . Right inguinal hernia 08/23/2017  . Chronic kidney disease, stage III (moderate) ([place]) 02/18/2017  . 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 Other fatigue 06/18/2016  . Essential hypertension 10/17/2015  . Mucositis due to chemotherapy 01/24/2015  .  Hyperglycemia due to type 2 diabetes mellitus ([place]) 01/07/2015  . Chronic anxiety 12/19/2014  . Stage 2 chronic renal impairment associated with type 2 diabetes mellitus ([place]) 12/19/2014  . Type 2 diabetes mellitus, controlled, with renal  complications ([place]) 37/85/8850  . History of nasopharyngeal cancer 11/26/2014    Palliative Care Assessment & Plan   Recommendations/Plan:  Recommend palliative to follow at D/C.   Code Status:    Code Status Orders  (From admission, onward)         Start     Ordered   02/23/18 2227  Do not attempt resuscitation (DNR)  Continuous    Question Answer Comment  In the event of cardiac or respiratory ARREST Do not call a "code blue"   In the event of cardiac or respiratory ARREST Do not perform Intubation, CPR, defibrillation or ACLS   In the event of cardiac or respiratory ARREST Use medication by any route, position, wound care, and other measures to relive pain and suffering. May use oxygen, suction and manual treatment of airway obstruction as needed for comfort.      02/23/18 2226        Code Status History    Date Active Date Inactive Code Status Order ID Comments User Context   02/02/2018 1758 02/23/2018 2127 DNR [id number]  Cathlyn Parsons, PA-C Inpatient   02/02/2018 1758 02/02/2018 1758 Full Code [id number]  Cathlyn Parsons, PA-C Inpatient   01/26/2018 1304 02/02/2018 1756 DNR [id number]  Marliss Coots, PA-C Inpatient   01/26/2018 1136 01/26/2018 1304 Full Code [id number]  Marliss Coots, PA-C ED   01/26/2018 0925 01/26/2018 0932 Full Code [id number]  Marliss Coots, PA-C ED   02/14/2015 1608 02/16/2015 1724 Full Code [id number]  Elgergawy, Silver Huguenin, MD Inpatient   12/17/2014 1311 12/19/2014 0330 Full Code [id number]  Greggory Keen, MD HOV   12/17/2014 1302 12/17/2014 1311 Full Code [id number]  Greggory Keen, MD HOV   12/06/2014 0950 12/06/2014 1404 Full Code [id number]  Lenn Cal, DDS Inpatient       Prognosis:  Difficult to determine, but I would not be surprised if patient had a prognosis of weeks to months given his high risk of aspiration pneumonia secondary to severe dysphasia in the context of 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  Discharge Planning:  To Be Determined   Thank you for allowing the Palliative Medicine Team to assist in the care of this patient.   Total Time 15 min Prolonged Time Billed  no       Greater than 50%  of this time was spent counseling and coordinating care related to the above assessment and plan.  Asencion Gowda, NP  Please contact Palliative Medicine Team phone at [phone] for questions and concerns.      [facility]

## 2018-02-28 DIAGNOSIS — J690 Pneumonitis due to inhalation of food and vomit: Principal | ICD-10-CM

## 2018-02-28 LAB — CBC
HEMATOCRIT: 30.6 % — AB (ref 39.0–52.0)
HEMOGLOBIN: 9.6 g/dL — AB (ref 13.0–17.0)
MCH: 25 pg — ABNORMAL LOW (ref 26.0–34.0)
MCHC: 31.4 g/dL (ref 30.0–36.0)
MCV: 79.7 fL (ref 78.0–100.0)
Platelets: 496 10*3/uL — ABNORMAL HIGH (ref 150–400)
RBC: 3.84 MIL/uL — ABNORMAL LOW (ref 4.22–5.81)
RDW: 14.8 % (ref 11.5–15.5)
WBC: 6.4 10*3/uL (ref 4.0–10.5)

## 2018-02-28 LAB — BASIC METABOLIC PANEL
ANION GAP: 12 (ref 5–15)
BUN: 57 mg/dL — ABNORMAL HIGH (ref 8–23)
CALCIUM: 9.3 mg/dL (ref 8.9–10.3)
CHLORIDE: 101 mmol/L (ref 98–111)
CO2: 26 mmol/L (ref 22–32)
Creatinine, Ser: 1.53 mg/dL — ABNORMAL HIGH (ref 0.61–1.24)
GFR calc non Af Amer: 44 mL/min — ABNORMAL LOW (ref >60)
GFR, EST AFRICAN AMERICAN: 51 mL/min — AB (ref >60)
Glucose, Bld: 206 mg/dL — ABNORMAL HIGH (ref 70–99)
Potassium: 4.4 mmol/L (ref 3.5–5.1)
SODIUM: 139 mmol/L (ref 135–145)

## 2018-02-28 LAB — GLUCOSE, CAPILLARY
GLUCOSE-CAPILLARY: 144 mg/dL — AB (ref 70–99)
GLUCOSE-CAPILLARY: 233 mg/dL — AB (ref 70–99)
GLUCOSE-CAPILLARY: 246 mg/dL — AB (ref 70–99)
Glucose-Capillary: 257 mg/dL — ABNORMAL HIGH (ref 70–99)
Glucose-Capillary: 251 mg/dL — ABNORMAL HIGH (ref 70–99)

## 2018-02-28 LAB — MAGNESIUM: MAGNESIUM: 2.2 mg/dL (ref 1.7–2.4)

## 2018-02-28 MED ORDER — DIPHENHYDRAMINE HCL 12.5 MG/5ML PO ELIX: 25.0000 mg | ORAL_SOLUTION | Freq: Four times a day (QID) | ORAL | 0 refills | Status: DC | PRN

## 2018-02-28 MED ORDER — GLYCOPYRROLATE 0.2 MG/ML IJ SOLN: 0.2000 mg | Freq: Two times a day (BID) | INTRAMUSCULAR | 0 refills | Status: DC

## 2018-02-28 MED ORDER — JEVITY 1.5 CAL/FIBER PO LIQD: 50.0000 mL/h | ORAL | 5 refills | Status: AC

## 2018-02-28 MED ORDER — INSULIN GLARGINE 100 UNIT/ML ~~LOC~~ SOLN: 8.0000 [IU] | Freq: Every day | SUBCUTANEOUS | 11 refills | Status: DC

## 2018-02-28 MED ORDER — PREDNISONE 20 MG PO TABS: 40.0000 mg | ORAL_TABLET | Freq: Every day | ORAL | 0 refills | Status: AC

## 2018-02-28 MED ORDER — METHYLPHENIDATE HCL 5 MG PO TABS: 5.0000 mg | ORAL_TABLET | Freq: Two times a day (BID) | ORAL | 0 refills | Status: DC

## 2018-02-28 MED ORDER — INSULIN ASPART 100 UNIT/ML ~~LOC~~ SOLN: 0.0000 [IU] | SUBCUTANEOUS | 11 refills | Status: DC

## 2018-02-28 MED ORDER — ADULT MULTIVITAMIN LIQUID CH: 15.0000 mL | Freq: Every day | ORAL | 0 refills | Status: AC

## 2018-02-28 MED ORDER — INSULIN GLARGINE 100 UNIT/ML ~~LOC~~ SOLN
8.0000 [IU] | Freq: Every day | SUBCUTANEOUS | Status: DC
  Administered 2018-02-28 – 2018-03-01 (×2): 8 [IU] via SUBCUTANEOUS
  Filled 2018-02-28 (×2): qty 0.08

## 2018-02-28 MED ORDER — METOPROLOL TARTRATE 25 MG PO TABS: 25.0000 mg | ORAL_TABLET | Freq: Two times a day (BID) | ORAL | 0 refills | Status: DC

## 2018-02-28 MED ORDER — PRO-STAT SUGAR FREE PO LIQD: 30.0000 mL | Freq: Two times a day (BID) | ORAL | 0 refills | Status: DC

## 2018-02-28 MED ORDER — FREE WATER: 100.0000 mL | 0 refills | Status: AC

## 2018-02-28 NOTE — Progress Notes (Signed)
Spoke with wife concerning patient's CPT and deep suction.  Pt is comfortable with clear breath sounds and O2 sat 94%.  Therapy just got patient up to bedside chair.  We will hold CPT at this time per request.  RT will continue to monitor.

## 2018-02-28 NOTE — Progress Notes (Addendum)
Penny Burn,                                                                                                                                                                                                           Daily Progress Note   Patient Name: Adam Yang       Date: 02/28/2018 DOB: 1946-04-30  Age: 72 y.o. MRN#: [id number] Attending Physician: Elodia Florence., * Primary Care Physician: Lawerance Cruel, MD Admit Date: 02/23/2018  Reason for Consultation/Follow-up: Psychosocial/spiritual support  Subjective: Patient is sitting in bedside chair. He denies complaint. Wife Tye Maryland is waiting for  SNF for placement.   Recommend outpatient palliative at D/C, SW aware.   Length of Stay: 5  Current Medications: Scheduled Meds:  . amLODipine  10 mg Oral Daily  . atorvastatin  20 mg Oral q1800  . chlorhexidine  15 mL Mouth Rinse BID  . diclofenac sodium  2 g Topical QID  . feeding supplement (PRO-STAT SUGAR FREE 64)  30 mL Per Tube BID  . fenofibrate  160 mg Oral Daily  . free water  100 mL Per Tube Q3H  . glycopyrrolate  0.2 mg Intravenous Q12H  . insulin aspart  0-9 Units Subcutaneous Q4H  . insulin glargine  8 Units Subcutaneous QHS  . mouth rinse  15 mL Mouth Rinse Q2H  . methylphenidate  5 mg Per Tube BID WC  . metoprolol tartrate  25 mg Per Tube BID  . multivitamin  15 mL Per Tube Daily  . predniSONE  40 mg Oral Q breakfast    Continuous Infusions: . feeding supplement (JEVITY 1.5 CAL/FIBER) 1,000 mL (02/27/18 1556)    PRN Meds: acetaminophen (TYLENOL) oral liquid 160 mg/5 mL, artificial tears, diphenhydrAMINE, glycopyrrolate, hydrocortisone cream, tiZANidine  Physical Exam  Constitutional: No distress.  Pulmonary/Chest: Effort normal.  Neurological: He is alert.  Skin: Skin is warm and dry.            Vital Signs: BP 136/72 (BP Location: Left Arm)   Pulse 82   Temp 98.3 F (36.8 C)   Resp 18   Ht 5\' 9"  (1.753 m) Comment: 02/02/18  Wt 60.2 kg  Comment: 02/22/18  SpO2 100%   BMI 19.60 kg/m  SpO2: SpO2: 100 % O2 Device: O2 Device: Room Air O2 Flow Rate: O2 Flow Rate (L/min): 0.5 L/min  Intake/output summary:  No intake or output data in the 24 hours ending 02/28/18 1505 LBM: Last BM Date: 02/25/18 Baseline Weight: Weight: 60.2 kg(02/22/18) Most recent weight: Weight: 60.2 kg(02/22/18)  Palliative Assessment/Data:    Flowsheet Rows     Most Recent Value  Intake Tab  Referral Department  Hospitalist  Unit at Time of Referral  Med/Surg Unit  Palliative Care Primary Diagnosis  Sepsis/Infectious Disease  Date Notified  02/24/18  Palliative Care Type  New Palliative care  Reason Not Seen  Discharged before seen  Reason for referral  Clarify Goals of Care, Psychosocial or Spiritual support  Date of Admission  02/23/18  Date first seen by Palliative Care  02/25/18  # of days Palliative referral response time  1 Day(s)  # of days IP prior to Palliative referral  1  Clinical Assessment  Palliative Performance Scale Score  40%  Pain Max last 24 hours  Not able to report  Pain Min Last 24 hours  Not able to report  Dyspnea Max Last 24 Hours  Not able to report  Dyspnea Min Last 24 hours  Not able to report  Nausea Max Last 24 Hours  Not able to report  Nausea Min Last 24 Hours  Not able to report  Anxiety Max Last 24 Hours  Not able to report  Anxiety Min Last 24 Hours  Not able to report  Other Max Last 24 Hours  Not able to report  Psychosocial & Spiritual Assessment  Palliative Care Outcomes  Patient/Family meeting held?  Yes  Who was at the meeting?  wife via phone  Palliative Care Outcomes  Provided psychosocial or spiritual support, Clarified goals of care  Patient/Family wishes: Interventions discontinued/not started   Mechanical Ventilation  Palliative Care follow-up planned  Yes, Facility      Patient Active Problem List   Diagnosis Date Noted  . Copious oral secretions   . Goals of care,  counseling/discussion   . Palliative care by specialist   . Malnutrition of moderate degree 02/24/2018  . Pneumonia of both lungs due to infectious organism 02/23/2018  . Pneumonia due to infectious organism 02/23/2018  . Multifocal pneumonia 02/22/2018  . Aspiration pneumonia of right lower lobe due to milk ([place])   . Dysphasia   . Anemia of chronic disease   . Hypernatremia   . Transaminitis   . Hypertensive emergency 02/02/2018  . Intraparenchymal hemorrhage of brain ([place]) 02/02/2018  . Hemiparesis affecting right side as late effect of stroke ([place])   . Aphasia, post-stroke   . Dysphagia, post-stroke   . Nontraumatic subcortical hemorrhage of left cerebral hemisphere ([place])   . Diabetes mellitus type 2 in nonobese (HCC)   . AKI (acute kidney injury) ([place])   . Hyponatremia   . Chronic kidney disease   . Cytotoxic brain edema ([place]) 01/28/2018  . Brain herniation ([place]) 01/28/2018  . IVH (intraventricular hemorrhage) ([place]) 01/27/2018  . Right inguinal hernia 08/23/2017  . Chronic kidney disease, stage III (moderate) ([place]) 02/18/2017  . 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 Other fatigue 06/18/2016  . Essential hypertension 10/17/2015  . Mucositis due to chemotherapy 01/24/2015  . Hyperglycemia due to type 2 diabetes mellitus ([place]) 01/07/2015  . Chronic anxiety 12/19/2014  . Stage 2 chronic renal impairment associated with type 2 diabetes mellitus ([place]) 12/19/2014  . Type 2 diabetes mellitus, controlled, with renal complications ([place]) 76/16/0737  . History of nasopharyngeal cancer 11/26/2014    Palliative Care Assessment & Plan   Recommendations/Plan:  Recommend palliative to follow at D/C.   Code Status:    Code Status Orders  (From admission, onward)  Start     Ordered   02/23/18 2227  Do not attempt resuscitation (DNR)  Continuous    Question Answer Comment  In the event of cardiac or respiratory ARREST  Do not call a "code blue"   In the event of cardiac or respiratory ARREST Do not perform Intubation, CPR, defibrillation or ACLS   In the event of cardiac or respiratory ARREST Use medication by any route, position, wound care, and other measures to relive pain and suffering. May use oxygen, suction and manual treatment of airway obstruction as needed for comfort.      02/23/18 2226        Code Status History    Date Active Date Inactive Code Status Order ID Comments User Context   02/02/2018 1758 02/23/2018 2127 DNR [id number]  Cathlyn Parsons, PA-C Inpatient   02/02/2018 1758 02/02/2018 1758 Full Code [id number]  Cathlyn Parsons, PA-C Inpatient   01/26/2018 1304 02/02/2018 1756 DNR [id number]  Marliss Coots, PA-C Inpatient   01/26/2018 1136 01/26/2018 1304 Full Code [id number]  Marliss Coots, PA-C ED   01/26/2018 0925 01/26/2018 0932 Full Code [id number]  Marliss Coots, PA-C ED   02/14/2015 1608 02/16/2015 1724 Full Code [id number]  Elgergawy, Silver Huguenin, MD Inpatient   12/17/2014 1311 12/19/2014 0330 Full Code [id number]  Greggory Keen, MD HOV   12/17/2014 1302 12/17/2014 1311 Full Code [id number]  Greggory Keen, MD HOV   12/06/2014 0950 12/06/2014 1404 Full Code [id number]  Lenn Cal, DDS Inpatient       Prognosis:  Difficult to determine, but I would not be surprised if patient had a prognosis of weeks to months given his high risk of aspiration pneumonia secondary to severe dysphasia in the context of 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  Discharge Planning:  To Be Determined   Thank you for allowing the Palliative Medicine Team to assist in the care of this patient.   Total Time 15 min Prolonged Time Billed  no       Greater than 50%  of this time was spent counseling and coordinating care related to the above assessment and plan.  Asencion Gowda,  NP  Please contact Palliative Medicine Team phone at [phone] for questions and concerns.      [facility]

## 2018-02-28 NOTE — Progress Notes (Signed)
CSW received call from PMT NP that patient's wife was requesting to cancel the meeting today and would like referral faxed to [facility] and [facility]. CSW completed referral and faxed out.  CSW to follow.  Laveda Abbe, [place] Clinical Social Worker [phone]

## 2018-02-28 NOTE — Progress Notes (Signed)
Occupational Therapy Treatment Patient Details Name: Adam Yang MRN: [id number] DOB: 11/16/1945 Today's Date: 02/28/2018    History of present illness Patient is a 72 y/o male admitted from [place] at [facility] due to HCAP with acute hypoxic respiratory failure. Of note, admitted on 7/18 for left frontoparietal bleed with resultant global aphasia, right hemiplegia, and right neglect. PMH significant for hypertension, diabetes, hyperlipidemia past radiation for nasopharynx cancer.   OT comments  Pt is slowly progressing toward OT goals. Pt completed functional mobility with total A this session but with great improvements observed in bed mobility. Pt was able to complete single leg bridge with vc only, fully clearing hips for clothing management. Extensive edu with pt's wife re d/c planning. SNF remains appropriate d/c plan.   Follow Up Recommendations  SNF;Supervision/Assistance - 24 hour    Equipment Recommendations  Other (comment)(defer to next venue of care)    Recommendations for Other Services      Precautions / Restrictions Precautions Precautions: Fall Precaution Comments: R hemi, R inattention, pusher, global aphasia Restrictions Weight Bearing Restrictions: No       Mobility Bed Mobility Overal bed mobility: Needs Assistance Bed Mobility: Rolling;Supine to Sit Rolling: Min assist   Supine to sit: Mod assist;HOB elevated     General bed mobility comments: great improvement with rolling toward the R  Transfers Overall transfer level: Needs assistance Equipment used: 2 person hand held assist Transfers: Sit to/from [facility] Sit to Stand: Total assist;+2 physical assistance Stand pivot transfers: Total assist;+2 physical assistance       General transfer comment: total A +2 for transfers; little initiation from impaired side    Balance Overall balance assessment: Needs assistance Sitting-balance support: Feet supported;Single extremity  supported Sitting balance-Leahy Scale: Poor Sitting balance - Comments: R pusher Postural control: Right lateral lean Standing balance support: Bilateral upper extremity supported;During functional activity Standing balance-Leahy Scale: Zero                             ADL either performed or assessed with clinical judgement   ADL                       Lower Body Dressing: Total assistance;Bed level Lower Body Dressing Details (indicate cue type and reason): to don briefs/pants             Functional mobility during ADLs: Total assistance;+2 for physical assistance;+2 for safety/equipment General ADL Comments: total A this session for bed level LB dressing and functional mobility     Vision       Perception     Praxis      Cognition Arousal/Alertness: Awake/alert Behavior During Therapy: WFL for tasks assessed/performed Overall Cognitive Status: Difficult to assess                                 General Comments: able to answer yes/no questions 50% of the time        Exercises     Shoulder Instructions       General Comments      Pertinent Vitals/ Pain       Pain Assessment: No/denies pain  Home Living  Prior Functioning/Environment              Frequency  Min 2X/week        Progress Toward Goals  OT Goals(current goals can now be found in the care plan section)  Progress towards OT goals: Progressing toward goals  Acute Rehab OT Goals Patient Stated Goal: none stated OT Goal Formulation: Patient unable to participate in goal setting Time For Goal Achievement: 03/07/18 Potential to Achieve Goals: Good  Plan Discharge plan remains appropriate;Frequency remains appropriate    Co-evaluation          OT goals addressed during session: ADL's and self-care      AM-PAC PT "6 Clicks" Daily Activity     Outcome Measure   Help from another  person eating meals?: Total Help from another person taking care of personal grooming?: A Lot Help from another person toileting, which includes using toliet, bedpan, or urinal?: Total Help from another person bathing (including washing, rinsing, drying)?: Total Help from another person to put on and taking off regular upper body clothing?: Total Help from another person to put on and taking off regular lower body clothing?: Total 6 Click Score: 7    End of Session Equipment Utilized During Treatment: Gait belt  OT Visit Diagnosis: Unsteadiness on feet (R26.81);Muscle weakness (generalized) (M62.81);Other symptoms and signs involving cognitive function;Cognitive communication deficit (R41.841);Hemiplegia and hemiparesis Hemiplegia - Right/Left: Right Hemiplegia - dominant/non-dominant: Non-Dominant   Activity Tolerance Patient limited by lethargy   Patient Left in chair;with call bell/phone within reach;with family/visitor present   Nurse Communication Mobility status        Time: 7505-1833 OT Time Calculation (min): 34 min  Charges: OT General Charges $OT Visit: 1 Visit OT Treatments $Self Care/Home Management : 23-37 mins   Curtis Sites OTR/L 02/28/2018, 2:53 PM

## 2018-02-28 NOTE — Progress Notes (Signed)
Inpatient Diabetes Program Recommendations  AACE/ADA: New Consensus Statement on Inpatient Glycemic Control (2015)  Target Ranges:  Prepandial:   less than 140 mg/dL      Peak postprandial:   less than 180 mg/dL (1-2 hours)      Critically ill patients:  140 - 180 mg/dL   Lab Results  Component Value Date   GLUCAP 257 (H) 02/28/2018   HGBA1C 6.0 (H) 01/27/2018    Review of Glycemic Control Results for Adam Yang, Adam Yang (MRN [id number]) as of 02/28/2018 11:02  Ref. Range 02/27/2018 20:32 02/27/2018 23:39 02/28/2018 04:06 02/28/2018 07:47  Glucose-Capillary Latest Ref Range: 70 - 99 mg/dL 177 (H) 231 (H) 144 (H) 257 (H)   Diabetes history: Type 2 dM Outpatient Diabetes medications: Novolog 0-15 units Q4H Current orders for Inpatient glycemic control: Novolog 0-15 units Q4H, Lantus 5 units QHS  Inpatient Diabetes Program Recommendations:   Consider increasing Lantus to 8 units QHS.   Thanks, Bronson Curb, MSN, RNC-OB Diabetes Coordinator [phone] (8a-5p)

## 2018-02-28 NOTE — Discharge Summary (Addendum)
Physician Discharge Summary  Adam Yang BDZ:[id number] DOB: 07-Jul-1946 DOA: 02/23/2018  PCP: Lawerance Cruel, MD  Admit date: 02/23/2018 Discharge date: 03/01/2018  Time spent: 40 minutes  Recommendations for Outpatient Follow-up:  1. Follow up outpatient CBC/CMP 2. Ensure follow up with PCP as outpatient 3. Ensure follow up with [facility] 4. Continue chest PT and nasotracheal suctioning as needed 5. Please have palliative continue to follow at SNF - adjust glycopyrrolate for secretions as needed 6. Would recommend allergy follow up as outpatient with rash in setting of cefepime/flagyl 7. Follow blood sugars and adjust regimen as needed.  Lantus may need to be decreased when prednisone complete. 8. 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 He was unable to obtain an MRI when his HR dropped into the 30s repeatedly. He had severe HTN and was admitted to the ICU. He had resultant global aphasia, right hemiplegia, and right neglect. He improved well enough to be appropriate for transfer to CIR. On 7/25, he was found to be unresponsive with a BP of 76/48. Patient had been hypertensive to 188/100 and was given 20 mg hydralazine with resultant BP 68/39 and unresponsive 30 minutes later. He was given IVF bolus and had improved BP to 149/73 with  increased mental status. He was transferred to CIR later that day. On 8/10, he was found to have bibasilar PNA, R>L. He was started on Cefepime. He has had minimal progression in CIR and so is recommended to go to SNF based on review of rehab notes.   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 He had some episodes where he would go outside and feel presyncopal.HisBUN/Creatinine levelswereelevated, and he wassent to nephrology in April - but there was no good answer for what was wrong with the kidneys. Has been on HTN medications for about 20 years. He had been off Norvasc but it was added back over the last year. He was also taking fenofibrate, fish oil but really no other medications. He was not on ASA and hadno h/o CAD. 7/18, he had a CVA. He had a significant hemorrhage, not on anticoagulation. He was treated in the ICU here and treated with permissive HTN. He was able to try to talk and communicate. He was transferred out of ICU to the stroke unit. They felt that he would be a good candidate for rehab. He was interacting and recognizing people but unable to verbalize. He did have a dense right hemiparesis. He was transferred to rehab. He seems to have been downhill since arriving in rehab. He came over Thursday evening, 7/25. The night before transfer, his BP dropped to 70/40 and yet he was still transferred; she is concerned that this might have been premature.Friday, they introducedthetherapy plan. He fell out of bed on Friday. He was moving his legs and active, "typical stroke, hethought he could do it." He got a  telesitter at that time. Since then, he has seemed weaker. Last week, SW told them this unit is for intense rehab, may need to think about long-range plans. His family was considering bringing him home. He seemed to be really trying with PT/OT. He had an NG tube and he was getting accuchecks q4h - he  previously had a PEG tube during his remote cancerand his wife was concerned about going that route again. She requested that he be changed to Glucerna and not continue SSI. Radiology came over the weekend to obtain her consentto transition him to PEG tube. His wife was concered about this and she wantedto wait to proceed with the tube.His wife requested that he not go for a PEG yet. He went on Monday and got the PEG despite her concerns - although the wife did sign the consent on Saturday. His wife is now concerned that he has a PEG; getting 3 hours of therapy services; pain due to the PEG - so he was not meeting goals of therapy and that "all these setbacks" may be the reason. Last week, Monday/Tuesday he was doing well with PT. Wednesday, he was excessively fatigued and had difficulty participating in therapy. BP 90/50. His wife was then concerned about excessive medication for hypertension. His wife was then concerned that he was started on new medications not discussed with her ("Ritalin, Baclofen, Vasopressin"). She had not spoken with Dr. Letta Pate and so was again frustrated. His wife wanted to [name] all of his medications since he had not previously been on many medications at home. Friday evening, his wife noticed that he felt warm; the weekend nurse was concerned and sent him for a CXR. He was started on antibiotics for possible aspiration and was started on respiratory toilet. Monday, his wife was concerned that she still had not met Dr. Letta Pate. She felt that the antibiotics were working. She wants to ensure that he has mouth care q2h due to h/o head/neck cancer. SW last week asked for her top 3 choices for SNF last week; they were expecting to stay in rehab until 8/21. Today, she was informed that [facility] will only pay through 8/16 and then will need [place] to another facility. She feels like he is wasting his rehab days while he has an acute illness. His wife reached out to  the ENT and Dr. Harrington Challenger; she thought maybe he needed to go to [facility] or to the acute side. SW today told her that maybe he isn't stable for transport to a SNF at this time related to his PNA. Shejust wants the best for him and she just hates that he's going through this. She is "realistic in knowing that he hasn't made any gains with PT, but he has had every set back possible".  Summary of admission below He was transferred to 3W from CIR due to HCAP.  Cefepime was continued and he was started on flagyl for possible aspiration.  He's continued to improved on antibiotics and is now on room air on 8/20.  Due to concern for aspiration and the volume of his feeds, residuals were checked and he was converted from glucerna to jevity.  Free water was decreased to 100 cc q 3 hours.  He was seen by palliative who added robinul and recommended palliative to follow at SNF.  He was started on prednisone on 8/19 for urticarial rash, improving on 8/20.  See below for additional details  Hospital Course:  HCAP  Acute Hypoxic Respiratory Failure:  CXR from 8/10 with bibasilar pneumonia worse on R.  Repeat CXR 8/15 with worsening of bilateral R greater than L lower lung pneumonia.  Admitted to acute care from rehab on 8/15 for worsening pneumonia.   Improved 8/20 on room air, antibiotic course complete.  Wife understands risk of recurrent pneumonia with his chronic aspiration. CXR 8/17 with improving bilateral multifocal airspace process Cefepime 8/10 - 8/19 Will add flagyl for possible aspiration (8/15 - 8/19)  Follow repeat MRSA PCR (negative - will hold off on MRSA coverage at this time) Follow urine strep.  Sputum ordered, but unlikely to be able to obtain. No blood cultures obtained.  Will hold off on obtaining cx at this time unless pt develops fever or WBC improved today. 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  Now NPO after stroke.  S/p PEG placement.  Discussed with dietician for decreasing volume of feeds Glucerna 1.2 cal at 75 ml/hr -> jevity 1.5 50 ml/hr Decreased to 100 cc q 3 hrs free water Follow and adjust as needed Prostat 30 ml daily Try glycopyrrolate for secretions Will check residuals and follow (residuals have been low)  Addendum On 8/21 by Dr Erlinda Hong I have discussed with respiratory therapist, patient has not required deep suction for the last two days, patient has improved lung exam,  Patient can discharge to SNF with palliative care following as planned by Dr Florene Glen.   Urticarial Rash: unclear cause, tech states she noticed 8/18.  Wife says she thinks she noticed beginnings of rash on Saturday?  Medications vs contact?  Cefepime and flagyl have been discontinued (I believe these are the newest meds based on my chart review, both have been added to allergy list, though a bit unclear - glycopyrollate can cause rash in <2%, will continue for now).  Rash improved today after first day of prednisone.   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 - s/p peg on 7/29 - Had been transferred to inpatient rehab, but not making any significant progress per discussion with MD  - continue PT/OT/SLP - needs outpatient neurology follow up  Leukocytosis:  improved  Anemia:  H/H stable, negative hemoccult, follow. Stable.  Hypernatremia: improved, watch with decreased free water, stable.  CKD III: unclear baseline, currently better than recent baseline, follow (fluctuating, follow).  Apparently follows with renal as outpatinet.  Nasopharyngeal cancer s/p radiation: follows with  ENT  HLD: finofibrate, fish oil, lipitor  T2DM: SSI.  Will increase lantus to 8 units.  Continue to monitor and adjust, may need to be decreased after course of steroids.  Hypertension: continue norvasc 10.  HTNsive emergency on initial presentation. ** avoid IV hydralazine as pt with hypotension with this  Goals of Care:  Discussed with daughter and mother over phone on 8/15.  Recommended palliative care consult to discuss goals of care.  Discussed poor prognosis with his recent hemorrhagic stroke and now likely aspiration pneumonia.  Family agreeable to discuss with palliative care.  See above for many family concerns regarding his course up until this point. - Planning for SNF.  Palliative to follow at SNF.  Pt is DNR/DNI.   - robinul 0.2 mg q4  hrs prn for secretions and scheduled q12  Procedures: Study Conclusions  - Left ventricle: The cavity size was normal. Systolic function was   vigorous. The estimated ejection fraction was in the range of 65%   to 70%. Wall motion was normal; there were no regional wall   motion abnormalities. Left ventricular diastolic function   parameters were normal. - Aortic valve: Trileaflet; moderately thickened, moderately   calcified leaflets. Transvalvular velocity was within the normal   range. There was no stenosis. There was no regurgitation. - Mitral valve: There was no regurgitation. - Right ventricle: The cavity size was normal. Wall thickness was   normal. Systolic function was normal. - Tricuspid valve: There was no regurgitation. - Pulmonary arteries: Systolic pressure could not be accurately   estimated.  Impressions:  - Limited study quality, hyperdynamic LVEF, aortic valve leaflets   are calcified, no stenosis, no source of embolism was seen.   Consultations: Rehab  Discharge Exam: Vitals:   03/01/18 0313 03/01/18 0852  BP: (!) 145/79 102/63  Pulse: 79 69  Resp:  18  Temp: 97.6 F (36.4 C) 97.8 F (36.6 C)  SpO2:  96% 96%   Interactive.  Looks at me, shakes head yes and no appropriately.  Follows commands.  General: No acute distress. Cardiovascular: Heart sounds show a regular rate, and rhythm. Lungs: Clear to auscultation bilaterally  Abdomen: Soft, nontender, nondistended.  Peg in place. Neurological: Alert and unable to assess orientation. R sided weakness. Skin: Improved urticarial rash Extremities: No clubbing or cyanosis. No edema.   Discharge Instructions   Discharge Instructions    Call MD for:  difficulty breathing, headache or visual disturbances   Complete by:  As directed    Call MD for:  extreme fatigue   Complete by:  As directed    Call MD for:  persistant dizziness or light-headedness   Complete by:  As directed    Call MD for:  redness, tenderness, or signs of infection (pain, swelling, redness, odor or green/yellow discharge around incision site)   Complete by:  As directed    Call MD for:  severe uncontrolled pain   Complete by:  As directed    Call MD for:  temperature >100.4   Complete by:  As directed    Discharge instructions   Complete by:  As directed    Sliding scale insulin every 4 hours 0-9 Units, Subcutaneous, Every 4 hours Correction coverage: Sensitive (thin, NPO, renal) CBG < 70: implement hypoglycemia protocol CBG 70 - 120: 0 units CBG 121 - 150: 1 unit CBG 151 - 200: 2 units CBG 201 - 250: 3 units CBG 251 - 300: 5 units CBG 301 - 350: 7 units CBG 351 - 400: 9 units CBG > 400: call MD and obtain STAT lab verification   Discharge instructions   Complete by:  As directed    You were readmitted for healthcare associated pneumonia.  You improved with antibiotics.    We made some changes to your tube feeds and free water.   Your blood sugars and labs should be followed up as an outpatient to adjust your insulin regimen and feeds/free water.  For your rash, we started prednisone x5 days.  Continue benadryl and hydrocortisone as needed.  We added  cefepime and flagyl to your allergy list, but I think seeing an allergist would be wise to further evaluate this.  Follow up with neurology as an outpatient.  Return for new, recurrent, or worsening symptoms.  Please ask your PCP to request records from this hospitalization so they know what was done and what the next steps will be.   Discharge instructions   Complete by:  As directed    Strict npo, all nutrition/meds per tube Aspiration precaution   Increase activity slowly   Complete by:  As directed    Increase activity slowly   Complete by:  As directed    Increase activity slowly   Complete by:  As directed      Allergies as of 03/01/2018      Reactions   Cefepime    Urticarial rash - unclear cause, had been receiving cefepime for several days and had also had flagyl as well   Flagyl [metronidazole]    Urticarial rash - unclear, was also receiving cefepime      Medication List    STOP taking these medications   sodium chloride 0.9 % infusion     TAKE these medications   amLODipine 10 MG tablet Commonly known as:  NORVASC Take 1 tablet (10 mg total) by mouth daily.   artificial tears Oint ophthalmic ointment Commonly known as:  LACRILUBE Place into both eyes every 3 (three) hours as needed for dry eyes.   atorvastatin 20 MG tablet Commonly known as:  LIPITOR Take 1 tablet (20 mg total) by mouth daily at 6 PM.   chlorhexidine 0.12 % solution Commonly known as:  PERIDEX 15 mLs by Mouth Rinse route 2 (two) times daily.   diphenhydrAMINE 12.5 MG/5ML elixir Commonly known as:  BENADRYL Place 10 mLs (25 mg total) into feeding tube every 6 (six) hours as needed for itching.   feeding supplement (JEVITY 1.5 CAL/FIBER) Liqd Place 50 mL/hr into feeding tube continuous. What changed:  how much to take   feeding supplement (PRO-STAT SUGAR FREE 64) Liqd Place 30 mLs into feeding tube 2 (two) times daily. What changed:  when to take this   fenofibrate 160 MG  tablet Take 160 mg by mouth daily.   free water Soln Place 100 mLs into feeding tube every 3 (three) hours.   glycopyrrolate 0.2 MG/ML injection Commonly known as:  ROBINUL Inject 1 mL (0.2 mg total) into the vein every 12 (twelve) hours.   insulin aspart 100 UNIT/ML injection Commonly known as:  novoLOG Inject 0-9 Units into the skin every 4 (four) hours. What changed:  how much to take   insulin glargine 100 UNIT/ML injection Commonly known as:  LANTUS Inject 0.08 mLs (8 Units total) into the skin at bedtime. (continue to follow and adjust as pt may need less when no longer on steroids)   lisinopril 20 MG tablet Commonly known as:  PRINIVIL,ZESTRIL Take 1 tablet (20 mg total) by mouth daily.   methylphenidate 5 MG tablet Commonly known as:  RITALIN Place 1 tablet (5 mg total) into feeding tube 2 (two) times daily with breakfast and lunch.   metoprolol tartrate 25 MG tablet Commonly known as:  LOPRESSOR Place 1 tablet (25 mg total) into feeding tube 2 (two) times daily.   mouth rinse Liqd solution 15 mLs by Mouth Rinse route every 2 (two) hours.   multivitamin Liqd Place 15 mLs into feeding tube daily.   nystatin 100000 UNIT/ML suspension Commonly known as:  MYCOSTATIN Use as directed 5 mLs (500,000 Units total) in the mouth or throat 4 (four) times daily.   polyethylene glycol packet Commonly known as:  MIRALAX / GLYCOLAX Take 17 g by mouth daily.   predniSONE 20  MG tablet Commonly known as:  DELTASONE Place 2 tablets (40 mg total) into feeding tube daily with breakfast for 5 days.   senna-docusate 8.6-50 MG tablet Commonly known as:  Senokot-S Take 1 tablet by mouth 2 (two) times daily.   tiZANidine 4 MG tablet Commonly known as:  ZANAFLEX Take 4 mg by mouth at bedtime.      Allergies  Allergen Reactions  . Cefepime     Urticarial rash - unclear cause, had been receiving cefepime for several days and had also had flagyl as well  . Flagyl  [Metronidazole]     Urticarial rash - unclear, was also receiving cefepime     Contact information for follow-up providers    Lawerance Cruel, MD Follow up.   Specialty:  Family Medicine Contact information: [place] [address] [phone]        [facility] Follow up.   Why:  Please call for follow up  Contact information: [address]     [address] [place] [address] [phone]           Contact information for after-discharge care    Destination    [facility] .   Service:  Skilled Nursing Contact information: [address] [place] [phone]                   The results of significant diagnostics from this hospitalization (including imaging, microbiology, ancillary and laboratory) are listed below for reference.    Significant Diagnostic Studies: Ct Abdomen Wo Contrast  Result Date: 02/04/2018 CLINICAL DATA:  Hemorrhagic stroke, dysphagia and malnutrition. Assessment for percutaneous gastrostomy tube placement. EXAM: CT ABDOMEN WITHOUT CONTRAST TECHNIQUE: Multidetector CT imaging of the abdomen was performed following the standard protocol without IV contrast. COMPARISON:  None. FINDINGS: Lower chest: Bibasilar densities present. Subtle pneumonia at the posterior left lung base cannot be excluded. No visible pleural effusions. Hepatobiliary: Unenhanced appearance of the liver is unremarkable. There is some excreted contrast in the gallbladder with filling defects representing calculi. No evidence of gallbladder distention or inflammation. Pancreas: Unremarkable. No pancreatic ductal dilatation or surrounding inflammatory changes. Spleen: Normal in size without focal abnormality. Adrenals/Urinary Tract: Adrenal glands are unremarkable. Kidneys are normal, without renal calculi, focal lesion, or hydronephrosis. Stomach/Bowel: Feeding  tube present extending into the second portion of the duodenum. No hiatal hernia. Gastric anatomy is normal. Bowel shows no evidence of obstruction or ileus. No free air identified. No incidental masses or ascites. Vascular/Lymphatic: No significant vascular findings are present. No enlarged abdominal or pelvic lymph nodes. Other: No hernias identified.  No evidence of focal abscess. Musculoskeletal: No acute or significant osseous findings. IMPRESSION: 1. Normal gastric anatomy with no contraindication to percutaneous gastrostomy tube placement. 2. Cholelithiasis with vicarious excretion of contrast into the gallbladder lumen. 3. Bibasilar pulmonary opacities. Subtle pneumonia at the posterior left lung base cannot be excluded. Electronically Signed   By: Aletta Edouard M.D.   On: 02/04/2018 08:10   Ct Head Wo Contrast  Result Date: 02/03/2018 CLINICAL DATA:  Follow-up examination for intracranial hemorrhage. Found sitting on floor. EXAM: CT HEAD WITHOUT CONTRAST TECHNIQUE: Contiguous axial images were obtained from the base of the skull through the vertex without intravenous contrast. COMPARISON:  Prior CT from 01/28/2018 FINDINGS: Brain: Previously identified large intraparenchymal hemorrhage centered at the left frontal white matter overall similar in size and morphology, although is slightly decreased in density as  compared to previous exam, compatible with evolving hematoma. Associated regional mass effect with vasogenic edema is slightly worsened, with slightly worsened 6 mm of left-to-right shift. Small volume degenerating blood products present within the occipital horns of both lateral ventricles. Ventricular size relatively stable without hydrocephalus or ventricular trapping. Basilar cisterns remain patent. No new intracranial hemorrhage. No other acute intracranial abnormality. No other acute large vessel territory infarct. No extra-axial fluid collection. No mass lesion. Vascular: No hyperdense  vessel. Scattered vascular calcifications noted within the carotid siphons. Skull: Scalp soft tissues and calvarium demonstrate no acute abnormality. Sinuses/Orbits: Globes and orbital soft tissues demonstrate no acute finding chronic mucosal thickening within the right maxillary sinus. Nasogastric tube in place. Trace opacity right mastoid air cells. Other: None. IMPRESSION: 1. Continued interval evolution of large intraparenchymal hemorrhage centered at the left frontal lobe, similar in size and morphology from previous. Mildly increased vasogenic edema with regional mass effect, with slightly worsened 6 mm right-to-left midline shift. 2. Small volume intraventricular hemorrhage, similar to previous. No hydrocephalus or ventricular trapping. 3. No other new acute intracranial abnormality. Electronically Signed   By: Jeannine Boga M.D.   On: 02/03/2018 21:32   Ir Gastrostomy Tube Mod Sed  Result Date: 02/06/2018 INDICATION: 72 year old with intracranial hemorrhage and dysphagia. Patient needs a percutaneous gastrostomy tube for nutrition. EXAM: PERCUTANEOUS GASTROSTOMY TUBE WITH FLUOROSCOPIC GUIDANCE Physician: Stephan Minister. Henn, MD MEDICATIONS: Ancef 1 g; Antibiotics were administered within 1 hour of the procedure. ANESTHESIA/SEDATION: Versed 3 mg IV; Fentanyl 100 mcg IV Moderate Sedation Time:  15 minutes The patient was continuously monitored during the procedure by the interventional radiology nurse under my direct supervision. FLUOROSCOPY TIME:  Fluoroscopy Time: 4 minutes 6 seconds (6 mGy). COMPLICATIONS: None immediate. PROCEDURE: The procedure was explained to the patient's wife. The risks and benefits of the procedure were discussed and the patient's wife's questions were addressed. Informed consent was obtained from the patient. The patient was placed on the interventional table. An orogastric tube was placed with fluoroscopic guidance. The anterior abdomen was prepped and draped in sterile  fashion. Maximal barrier sterile technique was utilized including caps, mask, sterile gowns, sterile gloves, sterile drape, hand hygiene and skin antiseptic. Stomach was inflated with air through the orogastric tube. The skin and subcutaneous tissues were anesthetized with 1% lidocaine. A 17 gauge needle was directed into the distended stomach with fluoroscopic guidance. A wire was advanced into the stomach and a T-tact was deployed. A 9-French vascular sheath was placed and the orogastric tube was snared using a [employer] snare device. The orogastric tube and snare were pulled out of the patient's mouth. The snare device was connected to a 20-French gastrostomy tube. The snare device and gastrostomy tube were pulled through the patient's mouth and out the anterior abdominal wall. The gastrostomy tube was cut to an appropriate length. Contrast injection through gastrostomy tube confirmed placement within the stomach. Fluoroscopic images were obtained for documentation. The gastrostomy tube was flushed with normal saline. IMPRESSION: Successful fluoroscopic guided percutaneous gastrostomy tube placement. Electronically Signed   By: Markus Daft M.D.   On: 02/06/2018 12:33   Dg Chest Port 1 View  Result Date: 02/25/2018 CLINICAL DATA:  Pneumonia. EXAM: PORTABLE CHEST 1 VIEW COMPARISON:  02/23/2018 FINDINGS: Airspace opacification of the right mid lung and right base with slight improvement. Improving medial left basilar opacification. Cardiomediastinal silhouette and remainder the exam is unchanged. IMPRESSION: Improving bilateral multifocal airspace process likely improving pneumonia. Electronically Signed   By: Marin Olp  M.D.   On: 02/25/2018 16:22   Dg Chest Port 1 View  Result Date: 02/23/2018 CLINICAL DATA:  Pneumonia EXAM: PORTABLE CHEST 1 VIEW COMPARISON:  02/18/2018, PET CT 06/17/2015 FINDINGS: Worsened airspace disease at the left base and right mid to lower lung. No pleural effusion. Stable  cardiomediastinal silhouette. No pneumothorax. IMPRESSION: Worsening of bilateral right greater than left lower lung pneumonia. Electronically Signed   By: Donavan Foil M.D.   On: 02/23/2018 16:43   Dg Chest Port 1 View  Result Date: 02/18/2018 CLINICAL DATA:  Cough and chest congestion. EXAM: PORTABLE CHEST 1 VIEW COMPARISON:  PET-CT dated 06/17/2015. FINDINGS: Normal sized heart. Patchy opacity in the right lower lung zone. Small amount of patchy density in the left lower lobe. Mildly prominent pulmonary vasculature and interstitial markings. Thoracic spine and bilateral AC joint degenerative changes. IMPRESSION: 1. Bibasilar pneumonia, greater on the right. 2. Mild pulmonary vascular congestion and mild chronic interstitial lung disease. Electronically Signed   By: Claudie Revering M.D.   On: 02/18/2018 09:22    Microbiology: Recent Results (from the past 240 hour(s))  MRSA PCR Screening     Status: None   Collection Time: 02/23/18  5:59 PM  Result Value Ref Range Status   MRSA by PCR NEGATIVE NEGATIVE Final    Comment:        The GeneXpert MRSA Assay (FDA approved for NASAL specimens only), is one component of a comprehensive MRSA colonization surveillance program. It is not intended to diagnose MRSA infection nor to guide or monitor treatment for MRSA infections. Performed at [facility], [place] [address], [place], [place] [address]      Labs: Basic Metabolic Panel: Recent Labs  Lab 02/25/18 0547 02/26/18 0324 02/27/18 0819 02/28/18 1001 03/01/18 0518  NA 138 137 138 139 141  K 4.3 4.4 4.2 4.4 5.0  CL 99 99 99 101 101  CO2 _0 GLUCOSE 151* 146* 211* 206* 248*  BUN 46* 52* 54* 57* 61*  CREATININE 1.54* 1.75* 1.66* 1.53* 1.65*  CALCIUM 9.1 8.8* 9.0 9.3 9.5  MG 2.0 2.3 2.2 2.2 2.4   Liver Function Tests: Recent Labs  Lab 02/24/18 0434 02/25/18 0547 02/27/18 0819  AST 35 35 53*  ALT 37 38 43  ALKPHOS 76 66 56  BILITOT 0.3 0.3 0.3  PROT 6.6 6.6  6.3*  ALBUMIN 2.1* 2.0* 2.2*   No results for input(s): LIPASE, AMYLASE in the last 168 hours. No results for input(s): AMMONIA in the last 168 hours. CBC: Recent Labs  Lab 02/25/18 0547 02/26/18 0324 02/27/18 0819 02/28/18 1001 03/01/18 0518  WBC 8.3 7.3 8.3 6.4 8.6  HGB 9.7* 9.3* 9.7* 9.6* 9.6*  HCT 31.5* 29.9* 32.1* 30.6* 31.6*  MCV 80.8 81.7 83.2 79.7 82.3  PLT 386 372 387 496* 478*   Cardiac Enzymes: No results for input(s): CKTOTAL, CKMB, CKMBINDEX, TROPONINI in the last 168 hours. BNP: BNP (last 3 results) No results for input(s): BNP in the last 8760 hours.  ProBNP (last 3 results) No results for input(s): PROBNP in the last 8760 hours.  CBG: Recent Labs  Lab 03/01/18 0024 03/01/18 0410 03/01/18 0500 03/01/18 0741 03/01/18 1128  GLUCAP 182* 242* 249* 232* 176*       Signed:  Florencia Reasons MD.  [facility] 03/01/2018, 2:00 PM

## 2018-02-28 NOTE — Progress Notes (Signed)
CSW following for discharge plan. CSW alerted patient's wife that [facility] are unable to take the patient; will need to review other options. CSW provided bed offers. [name] reviewed options and selected [facility]. CSW confirmed bed availability at [facility].   CSW initiated insurance authorization request earlier today through [facility]. CSW received call from April with [facility], asking additional questions about auth request. CSW provided information; case is still under review. Patient will need prior authorization before discharging to SNF. CSW alerted MD.  CSW to continue to follow.  Laveda Abbe, [place] Clinical Social Worker [phone]

## 2018-02-28 NOTE — Progress Notes (Signed)
  Speech Language Pathology Treatment: Cognitive-Linquistic  Patient Details Name: Adam Yang MRN: [id number] DOB: 03/10/1946 Today's Date: 02/28/2018 Time: 1749-4496 SLP Time Calculation (min) (ACUTE ONLY): 13 min  Assessment / Plan / Recommendation Clinical Impression  Pt participated in automatic speech tasks with Min cues needed for initiation and to break perseveration. He counted 1-10 with Min cues for accuracy, although with phonemic paraphasias also noted. He sang familiar songs, approximating the words in tune to the music, but with phonemic and semantic paraphasias. He demonstrated brief sustained attention to self-grooming task given Min verbal cues. Will continue to follow to facilitate functional communication.    HPI HPI: Patient is a 72 y/o male admitted from CIR at [facility] due to HCAP with acute hypoxic respiratory failure. Of note, admitted on 7/18 for left frontoparietal bleed with resultant global aphasia, right hemiplegia, and right neglect. PMH significant for hypertension, diabetes, hyperlipidemia past radiation for nasopharynx cancer. MBS 01/27/18 revealed severe oropharyngeal dysphagia and pt is s/p PEG placement 02/05/18.      SLP Plan  Continue with current plan of care       Recommendations                   Follow up Recommendations: Skilled Nursing facility SLP Visit Diagnosis: Aphasia (R47.01);Dysarthria and anarthria (R47.1);Cognitive communication deficit (P59.163) Plan: Continue with current plan of care       GO                Germain Osgood 02/28/2018, 5:04 PM  Germain Osgood, M.A. CCC-SLP [phone]

## 2018-02-28 NOTE — Progress Notes (Signed)
Patient breathing well at this time, BBS clear and diminished.  Patient does not wish to do CPT, as he is ready for bed.

## 2018-03-01 LAB — BASIC METABOLIC PANEL
Anion gap: 9 (ref 5–15)
BUN: 61 mg/dL — AB (ref 8–23)
CHLORIDE: 101 mmol/L (ref 98–111)
CO2: 31 mmol/L (ref 22–32)
Calcium: 9.5 mg/dL (ref 8.9–10.3)
Creatinine, Ser: 1.65 mg/dL — ABNORMAL HIGH (ref 0.61–1.24)
GFR calc Af Amer: 47 mL/min — ABNORMAL LOW (ref >60)
GFR calc non Af Amer: 40 mL/min — ABNORMAL LOW (ref >60)
GLUCOSE: 248 mg/dL — AB (ref 70–99)
POTASSIUM: 5 mmol/L (ref 3.5–5.1)
Sodium: 141 mmol/L (ref 135–145)

## 2018-03-01 LAB — GLUCOSE, CAPILLARY
GLUCOSE-CAPILLARY: 249 mg/dL — AB (ref 70–99)
GLUCOSE-CAPILLARY: 232 mg/dL — AB (ref 70–99)
GLUCOSE-CAPILLARY: 218 mg/dL — AB (ref 70–99)
Glucose-Capillary: 170 mg/dL — ABNORMAL HIGH (ref 70–99)
Glucose-Capillary: 176 mg/dL — ABNORMAL HIGH (ref 70–99)
Glucose-Capillary: 182 mg/dL — ABNORMAL HIGH (ref 70–99)
Glucose-Capillary: 217 mg/dL — ABNORMAL HIGH (ref 70–99)
Glucose-Capillary: 242 mg/dL — ABNORMAL HIGH (ref 70–99)

## 2018-03-01 LAB — CBC
HEMATOCRIT: 31.6 % — AB (ref 39.0–52.0)
HEMOGLOBIN: 9.6 g/dL — AB (ref 13.0–17.0)
MCH: 25 pg — ABNORMAL LOW (ref 26.0–34.0)
MCHC: 30.4 g/dL (ref 30.0–36.0)
MCV: 82.3 fL (ref 78.0–100.0)
Platelets: 478 10*3/uL — ABNORMAL HIGH (ref 150–400)
RBC: 3.84 MIL/uL — AB (ref 4.22–5.81)
RDW: 14.9 % (ref 11.5–15.5)
WBC: 8.6 10*3/uL (ref 4.0–10.5)

## 2018-03-01 LAB — MAGNESIUM: Magnesium: 2.4 mg/dL (ref 1.7–2.4)

## 2018-03-01 MED ORDER — AMLODIPINE BESYLATE 10 MG PO TABS
10.0000 mg | ORAL_TABLET | Freq: Every day | ORAL | Status: DC
  Administered 2018-03-01 – 2018-03-02 (×2): 10 mg
  Filled 2018-03-01 (×2): qty 1

## 2018-03-01 MED ORDER — ACETAMINOPHEN 160 MG/5ML PO SOLN: 650.0000 mg | Freq: Four times a day (QID) | ORAL | Status: DC | PRN

## 2018-03-01 MED ORDER — TIZANIDINE HCL 4 MG PO TABS: 4.0000 mg | ORAL_TABLET | Freq: Every evening | ORAL | Status: DC | PRN

## 2018-03-01 MED ORDER — FENOFIBRATE 160 MG PO TABS
160.0000 mg | ORAL_TABLET | Freq: Every day | ORAL | Status: DC
  Administered 2018-03-01 – 2018-03-02 (×2): 160 mg
  Filled 2018-03-01 (×2): qty 1

## 2018-03-01 MED ORDER — PREDNISONE 20 MG PO TABS: 40.0000 mg | ORAL_TABLET | Freq: Every day | ORAL | Status: DC

## 2018-03-01 MED ORDER — ATORVASTATIN CALCIUM 10 MG PO TABS
20.0000 mg | ORAL_TABLET | Freq: Every day | ORAL | Status: DC
  Administered 2018-03-01: 20 mg
  Filled 2018-03-01: qty 2

## 2018-03-01 MED ORDER — PREDNISONE 20 MG PO TABS
40.0000 mg | ORAL_TABLET | Freq: Every day | ORAL | Status: AC
  Administered 2018-03-01 – 2018-03-02 (×2): 40 mg
  Filled 2018-03-01 (×2): qty 2

## 2018-03-01 NOTE — Progress Notes (Signed)
Nutrition Follow-up  DOCUMENTATION CODES:   Non-severe (moderate) malnutrition in context of chronic illness  INTERVENTION:   -Continue Jevity 1.5 @ 50 ml/hr via PEG  30 ml Prostat BID.    100 ml free water flush every 3 hours  Tube feeding regimen provides 2000 kcal (100% of needs), 107 grams of protein, and 1712 ml of H2O.   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  Ongoing  GOAL:   Patient will meet greater than or equal to 90% of their needs  Met with TF  MONITOR:   Labs, Weight trends, Skin, I & O's  REASON FOR ASSESSMENT:   Low Braden, New TF    ASSESSMENT:   Adam Yang is an 72 y.o. male with past medical history of hypertension, diabetes, hyperlipidemia past radiation for nasopharynx cancer who presented on 7/18 for stroke-like symptoms, found to have left frontoparietal bleed  Pt lying in bed at time of visit; spoke to this RD when greeted.   Case discussed with RN, who reports pt is tolerating TF well. Pt is medically stable for discharge, however, still seeking SNF placement.   Jevity 1.5 infusing via PEG at 50 ml/hr. Pt also receiving 100 ml free water flush every 3 hours and 30 ml Prostat BID. Complete regimen provides 2000 kcals, 107 grams protien, and 912 ml (1712 ml total free water), meeting 100% of estimated kcal needs.   Labs reviewed: CBGS: 182- 249 (inpatient orders for glycemic control are 0-9 units insulin aspart every 4 hours and 8 units insulin glargine q HS).   Diet Order:   Diet Order            Diet NPO time specified  Diet effective now              EDUCATION NEEDS:   No education needs have been identified at this time  Skin:  Skin Assessment: Skin Integrity Issues: Skin Integrity Issues:: Other (Comment) Other: sacral  wound  Last BM:  02/28/18  Height:   Ht Readings from Last 1 Encounters:  02/24/18 _0  (1.753 m)    Weight:   Wt Readings from Last 1 Encounters:  02/24/18 60.2 kg    Ideal Body Weight:  72.7 kg  BMI:  Body mass index is 19.6 kg/m.  Estimated Nutritional Needs:   Kcal:  1850-2150  Protein:  100-120 grams  Fluid:  > 1.8 L    Adam Yang A. Jimmye Norman, RD, LDN, CDE Pager: [phone] After hours Pager: [phone]

## 2018-03-01 NOTE — Progress Notes (Signed)
CSW following for discharge plan. CSW had established plan this morning for [facility], and they rescinded bed offer later this morning. CSW spoke with patient's wife around 1:00 this afternoon to update her on status, patient's wife chose [facility]. CSW coordinated discharge plan for [facility]. CSW attempted to contact patient's wife starting at 2:44, left her multiple voicemails and she has never returned phone calls to discuss setting up transport for the patient.  Patient unable to transition to [facility] without patient's family providing permission, patient cannot discharge. CSW alerted MD.   CSW to follow.  Laveda Abbe, [place] Clinical Social Worker [phone]

## 2018-03-02 ENCOUNTER — Encounter: Payer: Self-pay | Admitting: Internal Medicine

## 2018-03-02 ENCOUNTER — Non-Acute Institutional Stay (SKILLED_NURSING_FACILITY): Payer: Medicare Other | Admitting: Internal Medicine

## 2018-03-02 ENCOUNTER — Encounter (HOSPITAL_COMMUNITY): Payer: Self-pay

## 2018-03-02 ENCOUNTER — Other Ambulatory Visit: Payer: Self-pay

## 2018-03-02 DIAGNOSIS — G936 Cerebral edema: Secondary | ICD-10-CM

## 2018-03-02 DIAGNOSIS — I619 Nontraumatic intracerebral hemorrhage, unspecified: Secondary | ICD-10-CM | POA: Diagnosis not present

## 2018-03-02 DIAGNOSIS — Z79899 Other long term (current) drug therapy: Secondary | ICD-10-CM | POA: Diagnosis not present

## 2018-03-02 DIAGNOSIS — G935 Compression of brain: Secondary | ICD-10-CM

## 2018-03-02 DIAGNOSIS — J189 Pneumonia, unspecified organism: Secondary | ICD-10-CM

## 2018-03-02 DIAGNOSIS — N183 Chronic kidney disease, stage 3 unspecified: Secondary | ICD-10-CM

## 2018-03-02 DIAGNOSIS — R633 Feeding difficulties: Secondary | ICD-10-CM | POA: Diagnosis not present

## 2018-03-02 DIAGNOSIS — Z7401 Bed confinement status: Secondary | ICD-10-CM | POA: Diagnosis not present

## 2018-03-02 DIAGNOSIS — I615 Nontraumatic intracerebral hemorrhage, intraventricular: Secondary | ICD-10-CM | POA: Diagnosis not present

## 2018-03-02 DIAGNOSIS — J181 Lobar pneumonia, unspecified organism: Secondary | ICD-10-CM

## 2018-03-02 DIAGNOSIS — E119 Type 2 diabetes mellitus without complications: Secondary | ICD-10-CM

## 2018-03-02 DIAGNOSIS — D638 Anemia in other chronic diseases classified elsewhere: Secondary | ICD-10-CM

## 2018-03-02 DIAGNOSIS — I2510 Atherosclerotic heart disease of native coronary artery without angina pectoris: Secondary | ICD-10-CM | POA: Diagnosis not present

## 2018-03-02 DIAGNOSIS — R0602 Shortness of breath: Secondary | ICD-10-CM | POA: Diagnosis not present

## 2018-03-02 DIAGNOSIS — D649 Anemia, unspecified: Secondary | ICD-10-CM | POA: Diagnosis not present

## 2018-03-02 DIAGNOSIS — E785 Hyperlipidemia, unspecified: Secondary | ICD-10-CM

## 2018-03-02 DIAGNOSIS — Z85818 Personal history of malignant neoplasm of other sites of lip, oral cavity, and pharynx: Secondary | ICD-10-CM | POA: Diagnosis not present

## 2018-03-02 DIAGNOSIS — I611 Nontraumatic intracerebral hemorrhage in hemisphere, cortical: Secondary | ICD-10-CM

## 2018-03-02 DIAGNOSIS — M6281 Muscle weakness (generalized): Secondary | ICD-10-CM | POA: Diagnosis not present

## 2018-03-02 DIAGNOSIS — Z85819 Personal history of malignant neoplasm of unspecified site of lip, oral cavity, and pharynx: Secondary | ICD-10-CM

## 2018-03-02 DIAGNOSIS — C119 Malignant neoplasm of nasopharynx, unspecified: Secondary | ICD-10-CM | POA: Diagnosis not present

## 2018-03-02 DIAGNOSIS — J168 Pneumonia due to other specified infectious organisms: Secondary | ICD-10-CM | POA: Diagnosis not present

## 2018-03-02 DIAGNOSIS — R531 Weakness: Secondary | ICD-10-CM | POA: Diagnosis not present

## 2018-03-02 DIAGNOSIS — I129 Hypertensive chronic kidney disease with stage 1 through stage 4 chronic kidney disease, or unspecified chronic kidney disease: Secondary | ICD-10-CM | POA: Diagnosis not present

## 2018-03-02 DIAGNOSIS — J9601 Acute respiratory failure with hypoxia: Secondary | ICD-10-CM

## 2018-03-02 DIAGNOSIS — E1122 Type 2 diabetes mellitus with diabetic chronic kidney disease: Secondary | ICD-10-CM | POA: Diagnosis not present

## 2018-03-02 DIAGNOSIS — E1169 Type 2 diabetes mellitus with other specified complication: Secondary | ICD-10-CM

## 2018-03-02 DIAGNOSIS — R0902 Hypoxemia: Secondary | ICD-10-CM | POA: Diagnosis not present

## 2018-03-02 DIAGNOSIS — E440 Moderate protein-calorie malnutrition: Secondary | ICD-10-CM | POA: Diagnosis not present

## 2018-03-02 DIAGNOSIS — I69391 Dysphagia following cerebral infarction: Secondary | ICD-10-CM | POA: Diagnosis not present

## 2018-03-02 DIAGNOSIS — R1310 Dysphagia, unspecified: Secondary | ICD-10-CM | POA: Diagnosis not present

## 2018-03-02 DIAGNOSIS — I69351 Hemiplegia and hemiparesis following cerebral infarction affecting right dominant side: Secondary | ICD-10-CM

## 2018-03-02 DIAGNOSIS — I69122 Dysarthria following nontraumatic intracerebral hemorrhage: Secondary | ICD-10-CM | POA: Diagnosis not present

## 2018-03-02 DIAGNOSIS — Z794 Long term (current) use of insulin: Secondary | ICD-10-CM | POA: Diagnosis not present

## 2018-03-02 DIAGNOSIS — I10 Essential (primary) hypertension: Secondary | ICD-10-CM

## 2018-03-02 DIAGNOSIS — Z431 Encounter for attention to gastrostomy: Secondary | ICD-10-CM | POA: Diagnosis not present

## 2018-03-02 DIAGNOSIS — I610 Nontraumatic intracerebral hemorrhage in hemisphere, subcortical: Secondary | ICD-10-CM

## 2018-03-02 DIAGNOSIS — J432 Centrilobular emphysema: Secondary | ICD-10-CM | POA: Diagnosis not present

## 2018-03-02 DIAGNOSIS — N182 Chronic kidney disease, stage 2 (mild): Secondary | ICD-10-CM | POA: Diagnosis not present

## 2018-03-02 DIAGNOSIS — I161 Hypertensive emergency: Secondary | ICD-10-CM

## 2018-03-02 DIAGNOSIS — I69320 Aphasia following cerebral infarction: Secondary | ICD-10-CM | POA: Diagnosis not present

## 2018-03-02 DIAGNOSIS — I9589 Other hypotension: Secondary | ICD-10-CM

## 2018-03-02 DIAGNOSIS — M2550 Pain in unspecified joint: Secondary | ICD-10-CM | POA: Diagnosis not present

## 2018-03-02 DIAGNOSIS — J690 Pneumonitis due to inhalation of food and vomit: Secondary | ICD-10-CM

## 2018-03-02 DIAGNOSIS — R404 Transient alteration of awareness: Secondary | ICD-10-CM | POA: Diagnosis not present

## 2018-03-02 DIAGNOSIS — I69120 Aphasia following nontraumatic intracerebral hemorrhage: Secondary | ICD-10-CM | POA: Diagnosis not present

## 2018-03-02 DIAGNOSIS — R05 Cough: Secondary | ICD-10-CM | POA: Diagnosis not present

## 2018-03-02 DIAGNOSIS — R6889 Other general symptoms and signs: Secondary | ICD-10-CM

## 2018-03-02 DIAGNOSIS — Z87891 Personal history of nicotine dependence: Secondary | ICD-10-CM | POA: Diagnosis not present

## 2018-03-02 LAB — GLUCOSE, CAPILLARY
GLUCOSE-CAPILLARY: 169 mg/dL — AB (ref 70–99)
GLUCOSE-CAPILLARY: 159 mg/dL — AB (ref 70–99)
GLUCOSE-CAPILLARY: 216 mg/dL — AB (ref 70–99)

## 2018-03-02 NOTE — Progress Notes (Signed)
Patient discharging to  [facility]. All belongings sent with patient. Nurse has called report.

## 2018-03-02 NOTE — Progress Notes (Signed)
Discharge to: [facility] Anticipated discharge date:  03/02/18 Family notified: Yes Transportation by: [facility]  Report #: [phone], Room 39  [place] signing off.  Laveda Abbe LCSW [phone]

## 2018-03-02 NOTE — Progress Notes (Signed)
Patient's discharge delayed yesterday , condition unchanged overnight. He is to discharge to snf today.

## 2018-03-02 NOTE — Progress Notes (Signed)
:   Location:  [facility] Room Number: 546E Place of Service:  SNF (31)  Shaleena Crusoe D. [name], MD  Patient Care Team: Lawerance Cruel, MD as PCP - General (Family Medicine) Heath Lark, MD as Consulting Physician (Hematology and Oncology) Arloa Koh, MD as Consulting Physician (Radiation Oncology) Karie Mainland, RD as Dietitian (Nutrition)  Extended Emergency Contact Information Primary Emergency Contact: Gair,Cathy Address: [place], [place] [address] [address] of [place] Phone: [phone] Mobile Phone: [phone] Relation: Spouse Secondary Emergency Contact: Westra, Summer Mobile Phone: [phone] Relation: Daughter     Allergies: Cefepime and Flagyl [metronidazole]  Chief Complaint  Patient presents with  . New Admit To SNF    Admit to [facility]    HPI: Patient is 72 y.o. male with history of nasopharyngeal cancer status post radiation, hypertension, diabetes, hyperlipidemia, who presented on 7/18 for weakness and was found to have a left frontoparietal bleed.  Patient was admitted to [facility] from 7/18-8/15 urine MRI was attempted but was unable to be performed secondary to patient continually dropping his heart rate into the 30s.  Patient was admitted to the ICU with global aphasia, right hemiplegia and right neglect.  He improved well enough to be transferred to CIR.  On 7/25 he was found to be unresponsive with a blood pressure of 76/48.  Patient had continuous problems throughout his stay with blood pressures being extremely high then extremely low and he was transferred in and out of CIR several times.  On 8/10 he was found to have bibasilar pneumonia and he was started on cefepime and Flagyl for possible aspiration pneumonia..  Patient developed a rash which was felt to be a drug rash in which did not cause any problems other than being itchy.  Patient improved on antibiotics and was able to  be weaned off to room air.  Patient had a PEG  placed and feedings were started.  Patient was seen by palliative care who started Robinul for excessive secretions.  Patient was started on prednisone on 8/19 for his rash which was improving by 8/20.  Patient did not make any gains in therapy and CIR and so SNF was recommended.  Patient was admitted to SNF for OT/PT.  While at SNF patient will be followed for diabetes mellitus treated with insulin, hyperlipidemia treated with fenofibrate and Lipitor and constipation treated with Senokot-S.  Past Medical History:  Diagnosis Date  . Anxiety    Claustrophobia  . Arthritis   . Cancer (HCC)    nasopharynx  . Chronic cough    EXCESS MUCUS BUILDUP SINCE ORAL SURGERY FROM CANCER   . Chronic kidney disease    Renal Insuffiency , Creatinine has been in the 2- 3 range, 11/27/14 Creatine 1.4  . Diabetes mellitus without complication ([place])    Type II  . Gout   . History of radiation therapy 12/23/2014- 02/10/2015   Nasopharynx / Neck Adenopathy  . Hyperlipidemia   . Hypertension   . Inguinal hernia   . Right inguinal hernia 08/23/2017    Past Surgical History:  Procedure Laterality Date  . colonoscopy    . COLONOSCOPY W/ POLYPECTOMY    . INGUINAL HERNIA REPAIR Right 08/23/2017   Procedure: OPEN REPAIR RIGHT INGUINAL HERNIA WITH MESH;  Surgeon: Fanny Skates, MD;  Location: [facility];  Service: General;  Laterality: Right;  GENERAL AND TAP BLOCK  . INSERTION  OF MESH Right 08/23/2017   Procedure: INSERTION OF MESH;  Surgeon: Fanny Skates, MD;  Location: [facility];  Service: General;  Laterality: Right;  GENERAL AND TAP BLOCK  . IR GASTROSTOMY TUBE MOD SED  02/06/2018  . KNEE SURGERY Left 1990's   tibia crushed, pinned and bone grafted  . LYMPH NODE BIOPSY    . MULTIPLE EXTRACTIONS WITH ALVEOLOPLASTY N/A 12/06/2014   Procedure: Extraction of tooth #'s 2,4,5,6,7,8,9,10,11,12,13,14,15, 20,22,26,27,28,29 with alveoloplasty.;  Surgeon: Lenn Cal, DDS;   Location: [facility];  Service: Oral Surgery;  Laterality: N/A;    Allergies as of 03/02/2018      Reactions   Cefepime    Urticarial rash - unclear cause, had been receiving cefepime for several days and had also had flagyl as well   Flagyl [metronidazole]    Urticarial rash - unclear, was also receiving cefepime      Medication List        Accurate as of 03/02/18  1:08 PM. Always use your most recent med list.          amLODipine 10 MG tablet Commonly known as:  NORVASC Take 1 tablet (10 mg total) by mouth daily.   artificial tears Oint ophthalmic ointment Commonly known as:  LACRILUBE Place into both eyes every 3 (three) hours as needed for dry eyes.   atorvastatin 20 MG tablet Commonly known as:  LIPITOR Take 1 tablet (20 mg total) by mouth daily at 6 PM.   chlorhexidine 0.12 % solution Commonly known as:  PERIDEX 15 mLs by Mouth Rinse route 2 (two) times daily.   diphenhydrAMINE 12.5 MG/5ML elixir Commonly known as:  BENADRYL Place 10 mLs (25 mg total) into feeding tube every 6 (six) hours as needed for itching.   feeding supplement (JEVITY 1.5 CAL/FIBER) Liqd Place 50 mL/hr into feeding tube continuous.   feeding supplement (PRO-STAT SUGAR FREE 64) Liqd Place 30 mLs into feeding tube 2 (two) times daily.   fenofibrate 160 MG tablet Take 160 mg by mouth daily.   free water Soln Place 100 mLs into feeding tube every 3 (three) hours.   glycopyrrolate 0.2 MG/ML injection Commonly known as:  ROBINUL Inject 1 mL (0.2 mg total) into the vein every 12 (twelve) hours.   insulin aspart 100 UNIT/ML injection Commonly known as:  novoLOG Inject 0-9 Units into the skin every 4 (four) hours.   insulin glargine 100 UNIT/ML injection Commonly known as:  LANTUS Inject 0.08 mLs (8 Units total) into the skin at bedtime. (continue to follow and adjust as pt may need less when no longer on steroids)   lisinopril 20 MG tablet Commonly known as:  PRINIVIL,ZESTRIL Take 1 tablet  (20 mg total) by mouth daily.   methylphenidate 5 MG tablet Commonly known as:  RITALIN Place 1 tablet (5 mg total) into feeding tube 2 (two) times daily with breakfast and lunch.   metoprolol tartrate 25 MG tablet Commonly known as:  LOPRESSOR Place 1 tablet (25 mg total) into feeding tube 2 (two) times daily.   mouth rinse Liqd solution 15 mLs by Mouth Rinse route every 2 (two) hours.   multivitamin Liqd Place 15 mLs into feeding tube daily.   nystatin 100000 UNIT/ML suspension Commonly known as:  MYCOSTATIN Use as directed 5 mLs (500,000 Units total) in the mouth or throat 4 (four) times daily.   polyethylene glycol packet Commonly known as:  MIRALAX / GLYCOLAX Take 17 g by mouth daily.   predniSONE 20 MG tablet  Commonly known as:  DELTASONE Place 2 tablets (40 mg total) into feeding tube daily with breakfast for 5 days.   senna-docusate 8.6-50 MG tablet Commonly known as:  Senokot-S Take 1 tablet by mouth 2 (two) times daily.   tiZANidine 4 MG tablet Commonly known as:  ZANAFLEX Take 4 mg by mouth at bedtime.       No orders of the defined types were placed in this encounter.    There is no immunization history on file for this patient.  Social History   Tobacco Use  . Smoking status: Former Smoker    Packs/day: 1.00    Years: 25.00    Pack years: 25.00    Last attempt to quit: 07/12/1998    Years since quitting: 19.6  . Smokeless tobacco: Never Used  Substance Use Topics  . Alcohol use: No    Family history is   Family History  Problem Relation Age of Onset  . Cancer Mother        throat ca  . Cancer Father        lung ca  . Cancer Brother        throat ca  . Heart block Brother       Review of Systems  DATA OBTAINED: from  nurse,  family member-wife GENERAL:  no fevers, fatigue, appetite changes SKIN:  rash-improving; patient thinks it is from the Flagyl as patient had been on cefepime many times before without problems EYES: No eye  pain, redness, discharge EARS: No earache, tinnitus, change in hearing NOSE: No congestion, drainage or bleeding  MOUTH/THROAT: No mouth or tooth pain, No sore throat RESPIRATORY: Some cough,- needs suction, no wheezing wheezing, SOB CARDIAC: No chest pain, palpitations, lower extremity edema  GI: No abdominal pain, No N/V/D or constipation, No heartburn or reflux  GU: No dysuria, frequency or urgency, or incontinence  MUSCULOSKELETAL: No unrelieved bone/joint pain NEUROLOGIC: No headache, dizziness or focal weakness PSYCHIATRIC: No c/o anxiety or sadness   Vitals:   03/02/18 1305  BP: 120/78  Pulse: 88  Resp: 20  Temp: (!) 97 F (36.1 C)    SpO2 Readings from Last 1 Encounters:  03/02/18 95%   Body mass index is 22 kg/m.     Physical Exam  GENERAL APPEARANCE: Alert, non-conversant,  No acute distress.  SKIN: No diaphoresis rash HEAD: Normocephalic, atraumatic  EYES: Conjunctiva/lids clear. Pupils round, reactive. EOMs intact.  EARS: External exam WNL, canals clear. Hearing grossly normal.  NOSE: No deformity or discharge.  MOUTH/THROAT: Lips w/o lesions  RESPIRATORY: Breathing is even, unlabored. Lung sounds are slight Rales left base CARDIOVASCULAR: Heart RRR no murmurs, rubs or gallops. No peripheral edema.   GASTROINTESTINAL: Abdomen is soft, non-tender, not distended w/ normal bowel sounds.  Hemicolon PEG tube GENITOURINARY: Bladder non tender, not distended  MUSCULOSKELETAL: No abnormal joints or musculature except very thin NEUROLOGIC:  Cranial nerves 2-12 grossly intact right PSYCHIATRIC: Mood and affect are flat, no behavioral issues  Patient Active Problem List   Diagnosis Date Noted  . Copious oral secretions   . Goals of care, counseling/discussion   . Palliative care by specialist   . Malnutrition of moderate degree 02/24/2018  . Pneumonia of both lungs due to infectious organism 02/23/2018  . Pneumonia due to infectious organism 02/23/2018  .  Multifocal pneumonia 02/22/2018  . Aspiration pneumonia of right lower lobe due to milk ([place])   . Dysphasia   . Anemia of chronic disease   . Hypernatremia   .  Transaminitis   . Hypertensive emergency 02/02/2018  . Intraparenchymal hemorrhage of brain ([place]) 02/02/2018  . Hemiparesis affecting right side as late effect of stroke ([place])   . Aphasia, post-stroke   . Dysphagia, post-stroke   . Nontraumatic subcortical hemorrhage of left cerebral hemisphere ([place])   . Diabetes mellitus type 2 in nonobese (HCC)   . AKI (acute kidney injury) ([place])   . Hyponatremia   . Chronic kidney disease   . Cytotoxic brain edema ([place]) 01/28/2018  . Brain herniation ([place]) 01/28/2018  . IVH (intraventricular hemorrhage) ([place]) 01/27/2018  . Right inguinal hernia 08/23/2017  . Chronic kidney disease, stage III (moderate) ([place]) 02/18/2017  . 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 Other fatigue 06/18/2016  . Essential hypertension 10/17/2015  . Mucositis due to chemotherapy 01/24/2015  . Hyperglycemia due to type 2 diabetes mellitus ([place]) 01/07/2015  . Chronic anxiety 12/19/2014  . Stage 2 chronic renal impairment associated with type 2 diabetes mellitus ([place]) 12/19/2014  . Type 2 diabetes mellitus, controlled, with renal complications ([place]) 85/46/2703  . History of nasopharyngeal cancer 11/26/2014      Labs reviewed: Basic Metabolic Panel:    Component Value Date/Time   NA 141 03/01/2018 0518   NA 139 02/18/2017 1502   K 5.0 03/01/2018 0518   K 4.3 02/18/2017 1502   CL 101 03/01/2018 0518   CO2 31 03/01/2018 0518   CO2 27 02/18/2017 1502   GLUCOSE 248 (H) 03/01/2018 0518   GLUCOSE 108 02/18/2017 1502   BUN 61 (H) 03/01/2018 0518   BUN 30.9 (H) 02/18/2017 1502   CREATININE 1.65 (H) 03/01/2018 0518   CREATININE 1.6 (H) 02/18/2017 1502   CALCIUM 9.5 03/01/2018 0518   CALCIUM 9.6 02/18/2017 1502   PROT 6.3 (L) 02/27/2018 0819   PROT 7.0  02/18/2017 1502   ALBUMIN 2.2 (L) 02/27/2018 0819   ALBUMIN 3.0 (L) 02/18/2017 1502   AST 53 (H) 02/27/2018 0819   AST 16 02/18/2017 1502   ALT 43 02/27/2018 0819   ALT 12 02/18/2017 1502   ALKPHOS 56 02/27/2018 0819   ALKPHOS 58 02/18/2017 1502   BILITOT 0.3 02/27/2018 0819   BILITOT 0.25 02/18/2017 1502   GFRNONAA 40 (L) 03/01/2018 0518   GFRAA 47 (L) 03/01/2018 0518    Recent Labs    01/27/18 0459 01/27/18 1739  02/27/18 0819 02/28/18 1001 03/01/18 0518  NA 139  --    < > 138 139 141  K 4.1  --    < > 4.2 4.4 5.0  CL 104  --    < > 99 101 101  CO2 22  --    < > 30 26 31   GLUCOSE 155*  --    < > 211* 206* 248*  BUN 40*  --    < > 54* 57* 61*  CREATININE 2.64*  --    < > 1.66* 1.53* 1.65*  CALCIUM 9.7  --    < > 9.0 9.3 9.5  MG 2.4 2.9*   < > 2.2 2.2 2.4  PHOS 4.7* 5.0*  --   --   --   --    < > = values in this interval not displayed.   Liver Function Tests: Recent Labs    02/24/18 0434 02/25/18 0547 02/27/18 0819  AST 35 35 53*  ALT 37 38 43  ALKPHOS 76 66 56  BILITOT 0.3 0.3 0.3  PROT 6.6 6.6 6.3*  ALBUMIN 2.1*  2.0* 2.2*   No results for input(s): LIPASE, AMYLASE in the last 8760 hours. No results for input(s): AMMONIA in the last 8760 hours. CBC: Recent Labs    08/19/17 1336 01/26/18 0913  02/03/18 0551  02/27/18 0819 02/28/18 1001 03/01/18 0518  WBC 7.1 5.8   < > 8.8   < > 8.3 6.4 8.6  NEUTROABS 4.7 2.8  --  6.3  --   --   --   --   HGB 12.1* 12.4*   < > 12.6*   < > 9.7* 9.6* 9.6*  HCT 36.5* 39.6   < > 42.5   < > 32.1* 30.6* 31.6*  MCV 81.1 82.2   < > 87.1   < > 83.2 79.7 82.3  PLT 319 324   < > 276   < > 387 496* 478*   < > = values in this interval not displayed.   Lipid Recent Labs    01/27/18 0459 01/30/18 0251 02/02/18 0436  CHOL 231*  --   --   HDL 49  --   --   LDLCALC 111*  --   --   TRIG 357*  348* 142 133    Cardiac Enzymes: No results for input(s): CKTOTAL, CKMB, CKMBINDEX, TROPONINI in the last 8760 hours. BNP: No  results for input(s): BNP in the last 8760 hours. No results found for: [facility] Lab Results  Component Value Date   HGBA1C 6.0 (H) 01/27/2018   Lab Results  Component Value Date   TSH 2.745 02/18/2017   No results found for: VITAMINB12 No results found for: FOLATE No results found for: IRON, TIBC, FERRITIN  Imaging and Procedures obtained prior to SNF admission: Dg Chest Port 1 View  Result Date: 02/23/2018 CLINICAL DATA:  Pneumonia EXAM: PORTABLE CHEST 1 VIEW COMPARISON:  02/18/2018, PET CT 06/17/2015 FINDINGS: Worsened airspace disease at the left base and right mid to lower lung. No pleural effusion. Stable cardiomediastinal silhouette. No pneumothorax. IMPRESSION: Worsening of bilateral right greater than left lower lung pneumonia. Electronically Signed   By: Donavan Foil M.D.   On: 02/23/2018 16:43     Not all labs, radiology exams or other studies done during hospitalization come through on my EPIC note; however they are reviewed by me.    Assessment and Plan  Left hemispheric stroke with large ICH, IVH and cytotoxic of cerebral edema with subfalcine herniation/hemorrhage due to hypertension- global aphasia, right hemiplegia and right neglect; improved, patient able to indicate by shaking head yes or no; status post PEG on 7/29; heavy transfer to inpatient rehab but not making any significant progress SNF -admitted for OT/PT/ST, feeding of Jevity 1.550 mils an hour with 100 cc every 3 free water; Robinul for secretions  Acute hypoxic respiratory failure/H CAP, possible aspiration- chest x-ray from 8/10 with bibasilar pneumonia, worse on right; treated with cefepime and Flagyl with improvement and able to be weaned off oxygen on 8/20; patient has cefepime from 8/10-8/19 and Flagyl from 8/15-19 SNF -for OT/PT  Hypertension/hypertensive emergency/hypotension- entire hospitalization in March with either hypertension dangerously high or hypotension; it was suggested patient not  get IV hydralazine as he became very hypotensive with it, of course he was given 20 mg IV of hydralazine which would make anybody hypotensive SNF -patient will be getting blood pressures every shift; continue to be careful with the amount of blood pressure meds he is given for an acute event as he seems to be sensitive to: For the moment his blood  pressure medicines are Norvasc 10 mg daily lisinopril 10 mg daily and metoprolol 25 mg twice daily  Anemia- stated that H/H was stable, negative Hemoccult SNF -follow-up CBC; hemoglobin 9.6 on discharge  Diabetes mellitus type 2 SNF -patient discharged on a sensitive sliding scale of NovoLog every 4 hours and Lantus insulin 8 units at bedtime which was started because of his prednisone and which will be DC on the last day of prednisone  Hyperlipidemia SNF -no status uncontrolled; patient was discharged on fenofibrate 160 mg daily and Lipitor; patient's wife is asked that Lipitor be discontinued, she says he is never needed; no harm in discharging at this time  Drug rash-being treated with prednisone for 5 days SNF -stated to be secondary to cefepime and Flagyl, however the wife believes that patient is actually allergic to Flagyl as he is taking cefepime many times without problems; we will continue prednisone for 5 more days  Chronic kidney disease stage III-baseline unknown SNF -discharge creatinine 1.6; we will follow-up BMP  Constipation SNF -continue Senokot-S twice daily    Time spent greater than 45 minutes;> 50% of time with patient was spent reviewing records, labs, tests and studies, counseling and developing plan of care  Webb Silversmith D. [name], MD

## 2018-03-05 ENCOUNTER — Encounter: Payer: Self-pay | Admitting: Internal Medicine

## 2018-03-05 DIAGNOSIS — E1169 Type 2 diabetes mellitus with other specified complication: Secondary | ICD-10-CM | POA: Insufficient documentation

## 2018-03-05 DIAGNOSIS — I959 Hypotension, unspecified: Secondary | ICD-10-CM | POA: Insufficient documentation

## 2018-03-05 DIAGNOSIS — J9601 Acute respiratory failure with hypoxia: Secondary | ICD-10-CM | POA: Insufficient documentation

## 2018-03-05 DIAGNOSIS — I619 Nontraumatic intracerebral hemorrhage, unspecified: Secondary | ICD-10-CM | POA: Insufficient documentation

## 2018-03-05 DIAGNOSIS — E785 Hyperlipidemia, unspecified: Secondary | ICD-10-CM

## 2018-03-06 ENCOUNTER — Encounter (HOSPITAL_COMMUNITY): Payer: Self-pay

## 2018-03-06 ENCOUNTER — Emergency Department (HOSPITAL_COMMUNITY)
Admission: EM | Admit: 2018-03-06 | Discharge: 2018-03-06 | Disposition: A | Discharge status: Skilled Nursing Facility | Payer: Medicare Other | Attending: Emergency Medicine | Admitting: Emergency Medicine

## 2018-03-06 ENCOUNTER — Other Ambulatory Visit: Payer: Self-pay

## 2018-03-06 ENCOUNTER — Emergency Department (HOSPITAL_COMMUNITY): Payer: Medicare Other

## 2018-03-06 DIAGNOSIS — I129 Hypertensive chronic kidney disease with stage 1 through stage 4 chronic kidney disease, or unspecified chronic kidney disease: Secondary | ICD-10-CM | POA: Diagnosis not present

## 2018-03-06 DIAGNOSIS — N182 Chronic kidney disease, stage 2 (mild): Secondary | ICD-10-CM | POA: Diagnosis not present

## 2018-03-06 DIAGNOSIS — J181 Lobar pneumonia, unspecified organism: Secondary | ICD-10-CM | POA: Diagnosis not present

## 2018-03-06 DIAGNOSIS — R05 Cough: Secondary | ICD-10-CM | POA: Diagnosis not present

## 2018-03-06 DIAGNOSIS — J168 Pneumonia due to other specified infectious organisms: Secondary | ICD-10-CM | POA: Diagnosis not present

## 2018-03-06 DIAGNOSIS — Z87891 Personal history of nicotine dependence: Secondary | ICD-10-CM | POA: Insufficient documentation

## 2018-03-06 DIAGNOSIS — Z79899 Other long term (current) drug therapy: Secondary | ICD-10-CM | POA: Insufficient documentation

## 2018-03-06 DIAGNOSIS — Z85818 Personal history of malignant neoplasm of other sites of lip, oral cavity, and pharynx: Secondary | ICD-10-CM | POA: Insufficient documentation

## 2018-03-06 DIAGNOSIS — Z7401 Bed confinement status: Secondary | ICD-10-CM | POA: Diagnosis not present

## 2018-03-06 DIAGNOSIS — R0602 Shortness of breath: Secondary | ICD-10-CM | POA: Diagnosis not present

## 2018-03-06 DIAGNOSIS — Z794 Long term (current) use of insulin: Secondary | ICD-10-CM | POA: Diagnosis not present

## 2018-03-06 DIAGNOSIS — E1122 Type 2 diabetes mellitus with diabetic chronic kidney disease: Secondary | ICD-10-CM | POA: Diagnosis not present

## 2018-03-06 DIAGNOSIS — R0902 Hypoxemia: Secondary | ICD-10-CM | POA: Diagnosis not present

## 2018-03-06 DIAGNOSIS — M2550 Pain in unspecified joint: Secondary | ICD-10-CM | POA: Diagnosis not present

## 2018-03-06 DIAGNOSIS — J189 Pneumonia, unspecified organism: Secondary | ICD-10-CM | POA: Diagnosis not present

## 2018-03-06 DIAGNOSIS — R404 Transient alteration of awareness: Secondary | ICD-10-CM | POA: Diagnosis not present

## 2018-03-06 DIAGNOSIS — R531 Weakness: Secondary | ICD-10-CM | POA: Diagnosis not present

## 2018-03-06 LAB — CBC WITH DIFFERENTIAL/PLATELET
BASOS ABS: 0 10*3/uL (ref 0.0–0.1)
Basophils Relative: 0 %
Eosinophils Absolute: 0 10*3/uL (ref 0.0–0.7)
Eosinophils Relative: 0 %
HCT: 39.1 % (ref 39.0–52.0)
Hemoglobin: 12.1 g/dL — ABNORMAL LOW (ref 13.0–17.0)
LYMPHS ABS: 1.2 10*3/uL (ref 0.7–4.0)
LYMPHS PCT: 8 %
MCH: 25.5 pg — AB (ref 26.0–34.0)
MCHC: 30.9 g/dL (ref 30.0–36.0)
MCV: 82.3 fL (ref 78.0–100.0)
MONO ABS: 0.4 10*3/uL (ref 0.1–1.0)
Monocytes Relative: 3 %
NEUTROS ABS: 13.4 10*3/uL — AB (ref 1.7–7.7)
Neutrophils Relative %: 89 %
Platelets: 665 10*3/uL — ABNORMAL HIGH (ref 150–400)
RBC: 4.75 MIL/uL (ref 4.22–5.81)
RDW: 16.3 % — ABNORMAL HIGH (ref 11.5–15.5)
WBC: 15 10*3/uL — AB (ref 4.0–10.5)

## 2018-03-06 LAB — COMPREHENSIVE METABOLIC PANEL
ALBUMIN: 3.3 g/dL — AB (ref 3.5–5.0)
ALT: 29 U/L (ref 0–44)
ANION GAP: 14 (ref 5–15)
AST: 26 U/L (ref 15–41)
Alkaline Phosphatase: 54 U/L (ref 38–126)
BILIRUBIN TOTAL: 0.5 mg/dL (ref 0.3–1.2)
BUN: 59 mg/dL — ABNORMAL HIGH (ref 8–23)
CHLORIDE: 102 mmol/L (ref 98–111)
CO2: 31 mmol/L (ref 22–32)
Calcium: 10 mg/dL (ref 8.9–10.3)
Creatinine, Ser: 1.52 mg/dL — ABNORMAL HIGH (ref 0.61–1.24)
GFR calc Af Amer: 52 mL/min — ABNORMAL LOW (ref >60)
GFR calc non Af Amer: 45 mL/min — ABNORMAL LOW (ref >60)
GLUCOSE: 149 mg/dL — AB (ref 70–99)
POTASSIUM: 3.7 mmol/L (ref 3.5–5.1)
Sodium: 147 mmol/L — ABNORMAL HIGH (ref 135–145)
TOTAL PROTEIN: 7.6 g/dL (ref 6.5–8.1)

## 2018-03-06 LAB — BASIC METABOLIC PANEL
BUN: 56 — AB (ref 4–21)
CREATININE: 1.4 — AB (ref 0.6–1.3)
GLUCOSE: 144
POTASSIUM: 4.1 (ref 3.4–5.3)
SODIUM: 144 (ref 137–147)

## 2018-03-06 LAB — CBC AND DIFFERENTIAL
HCT: 36 — AB (ref 41–53)
HEMOGLOBIN: 11.4 — AB (ref 13.5–17.5)
Platelets: 581 — AB (ref 150–399)
WBC: 10.5

## 2018-03-06 LAB — BRAIN NATRIURETIC PEPTIDE: B Natriuretic Peptide: 96.3 pg/mL (ref 0.0–100.0)

## 2018-03-06 LAB — TROPONIN I: Troponin I: <0.03 ng/mL (ref <0.03)

## 2018-03-06 MED ORDER — TIZANIDINE HCL 4 MG PO TABS
4.0000 mg | ORAL_TABLET | Freq: Once | ORAL | Status: AC
  Administered 2018-03-06: 4 mg via ORAL
  Filled 2018-03-06: qty 1

## 2018-03-06 MED ORDER — LEVOFLOXACIN IN D5W 750 MG/150ML IV SOLN
750.0000 mg | Freq: Once | INTRAVENOUS | Status: AC
  Administered 2018-03-06: 750 mg via INTRAVENOUS
  Filled 2018-03-06: qty 150

## 2018-03-06 MED ORDER — LEVOFLOXACIN 25 MG/ML PO SOLN: 750.0000 mg | Freq: Every day | ORAL | 0 refills | Status: DC

## 2018-03-06 MED ORDER — METOPROLOL TARTRATE 25 MG PO TABS
25.0000 mg | ORAL_TABLET | Freq: Once | ORAL | Status: AC
  Administered 2018-03-06: 25 mg via ORAL
  Filled 2018-03-06: qty 1

## 2018-03-06 MED ORDER — LEVOFLOXACIN 25 MG/ML PO SOLN: 750.0000 mg | Freq: Every day | ORAL | 0 refills | Status: AC

## 2018-03-06 MED ORDER — ACETAMINOPHEN 325 MG PO TABS
650.0000 mg | ORAL_TABLET | Freq: Once | ORAL | Status: AC
  Administered 2018-03-06: 650 mg via ORAL
  Filled 2018-03-06: qty 2

## 2018-03-06 NOTE — ED Notes (Signed)
[facility] Called 

## 2018-03-06 NOTE — ED Notes (Signed)
Bed: QS69 Expected date:  Expected time:  Means of arrival:  Comments: EMS-80y Male from facility w/ SOB

## 2018-03-06 NOTE — Discharge Instructions (Addendum)
As discussed, today's episode of difficulty breathing and congestion may have been due to secretions building up, but there is some suspicion for ongoing infection as well.  We recommend suction every 2 hrs as needed for secretions.   Consider side rails to your bed. You need fall precautions.     Please be sure to take the prescribed Levaquin for the next 6 days, and schedule follow-up with your outpatient provider.  Return here for concerning changes in your condition.

## 2018-03-06 NOTE — ED Triage Notes (Signed)
Per EMS: [facility].  10 days ago dx at pneumonia.  nasopharyngeal CA.  Problems with thick oral secretions.  Facility called with O2 sats dropping.  Pt O2 sats increased after suctioning.  EMS gave a duoneb.

## 2018-03-06 NOTE — ED Notes (Signed)
Pt asked to be moved to hall bed while waiting for transport.  Pt's wife refused and yelled at this nurse.  Pt's wife now thinks pt needs to be admitted.  MD notified and is talking with pt and his wife at this time.

## 2018-03-06 NOTE — ED Notes (Signed)
This nurse attempted to call [facility] for report

## 2018-03-06 NOTE — ED Provider Notes (Signed)
[facility] DEPT Provider Note   CSN: [id number] Arrival date & time: 03/06/18  1042     History   Chief Complaint Chief Complaint  Patient presents with  . Shortness of Breath    HPI Adam Yang is a 72 y.o. male.  HPI Patient with multiple medical issues presents from his nursing facility with concern of dyspnea. 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 He was recently hospitalized for pneumonia, and today staff noted that he had increased difficulty breathing. It does not seem as though he is still on antibiotics. EMS notes that on arrival the patient was hypoxic, with increased work of breathing, though symptoms resolved with suctioning. The patient himself denies pain, acknowledges weakness, but offers only brief semi-verbal or nodding responses to questions, which limits the history somewhat. Nursing home notes reviewed, EMS history provided to nursing, to obtain remainder of HPI.  Past Medical History:  Diagnosis Date  . Anxiety    Claustrophobia  . Arthritis   . Cancer (HCC)    nasopharynx  . Chronic cough    EXCESS MUCUS BUILDUP SINCE ORAL SURGERY FROM CANCER   . Chronic kidney disease    Renal Insuffiency , Creatinine has been in the 2- 3 range, 11/27/14 Creatine 1.4  . Diabetes mellitus without complication ([place])    Type II  . Gout   . History of radiation therapy 12/23/2014- 02/10/2015   Nasopharynx / Neck Adenopathy  . Hyperlipidemia   . Hypertension   . Inguinal hernia   . Right inguinal hernia 08/23/2017    Patient Active Problem List   Diagnosis Date Noted  . CVA (cerebrovascular accident due to intracerebral hemorrhage) ([place]) 03/05/2018  . Acute respiratory failure with hypoxia ([place]) 03/05/2018  . Hypotension 03/05/2018  . Hyperlipidemia associated with type 2 diabetes mellitus ([place]) 03/05/2018  . Copious oral  secretions   . Goals of care, counseling/discussion   . Palliative care by specialist   . Malnutrition of moderate degree 02/24/2018  . Pneumonia of both lungs due to infectious organism 02/23/2018  . Pneumonia due to infectious organism 02/23/2018  . Aspiration pneumonia ([place]) 02/22/2018  . Aspiration pneumonia of right lower lobe due to milk ([place])   . Dysphasia   . Anemia of chronic disease   . Hypernatremia   . Transaminitis   . Hypertensive emergency 02/02/2018  . Intraparenchymal hemorrhage of brain ([place]) 02/02/2018  . Hemiparesis affecting right side as late effect of stroke ([place])   . Aphasia, post-stroke   . Dysphagia, post-stroke   . Nontraumatic subcortical hemorrhage of left cerebral hemisphere ([place])   . Diabetes mellitus type 2 in nonobese (HCC)   . AKI (acute kidney injury) ([place])   . Hyponatremia   . Chronic kidney disease   . Cytotoxic brain edema ([place]) 01/28/2018  . Brain herniation ([place]) 01/28/2018  . IVH (intraventricular hemorrhage) ([place]) 01/27/2018  . Right inguinal hernia 08/23/2017  . Chronic kidney disease, stage III (moderate) ([place]) 02/18/2017  . 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 Other fatigue 06/18/2016  . Essential hypertension 10/17/2015  . Constipation 02/06/2015  . Drug-induced skin rash 02/03/2015  . Mucositis due to chemotherapy 01/24/2015  . Hyperglycemia due to type 2 diabetes mellitus ([place]) 01/07/2015  . Chronic anxiety 12/19/2014  . Stage 2 chronic renal impairment associated with type 2 diabetes mellitus ([place]) 12/19/2014  . Type 2 diabetes mellitus, controlled,  with renal complications ([place]) 53/29/9242  . History of nasopharyngeal cancer 11/26/2014    Past Surgical History:  Procedure Laterality Date  . colonoscopy    . COLONOSCOPY W/ POLYPECTOMY    . INGUINAL HERNIA REPAIR Right 08/23/2017   Procedure: OPEN REPAIR RIGHT INGUINAL HERNIA WITH MESH;  Surgeon: Fanny Skates, MD;   Location: [facility];  Service: General;  Laterality: Right;  GENERAL AND TAP BLOCK  . INSERTION OF MESH Right 08/23/2017   Procedure: INSERTION OF MESH;  Surgeon: Fanny Skates, MD;  Location: [facility];  Service: General;  Laterality: Right;  GENERAL AND TAP BLOCK  . IR GASTROSTOMY TUBE MOD SED  02/06/2018  . KNEE SURGERY Left 1990's   tibia crushed, pinned and bone grafted  . LYMPH NODE BIOPSY    . MULTIPLE EXTRACTIONS WITH ALVEOLOPLASTY N/A 12/06/2014   Procedure: Extraction of tooth #'s 2,4,5,6,7,8,9,10,11,12,13,14,15, 20,22,26,27,28,29 with alveoloplasty.;  Surgeon: Lenn Cal, DDS;  Location: [facility];  Service: Oral Surgery;  Laterality: N/A;        Home Medications    Prior to Admission medications   Medication Sig Start Date End Date Taking? Authorizing Provider  Amino Acids-Protein Hydrolys (FEEDING SUPPLEMENT, PRO-STAT SUGAR FREE 64,) LIQD Place 30 mLs into feeding tube 2 (two) times daily. 02/28/18  Yes Elodia Florence., MD  amLODipine (NORVASC) 10 MG tablet Take 1 tablet (10 mg total) by mouth daily. Patient taking differently: Place 10 mg into feeding tube daily.  02/03/18  Yes Donzetta Starch, NP  artificial tears (LACRILUBE) OINT ophthalmic ointment Place into both eyes every 3 (three) hours as needed for dry eyes. 02/02/18  Yes Donzetta Starch, NP  chlorhexidine (PERIDEX) 0.12 % solution 15 mLs by Mouth Rinse route 2 (two) times daily. Patient taking differently: 15 mLs by Mouth Rinse route 2 (two) times daily. Use 15 ml to swab mouth twice a day 02/02/18  Yes Donzetta Starch, NP  fenofibrate 160 MG tablet Place 160 mg into feeding tube daily.    Yes [provider]  glycopyrrolate (ROBINUL) 0.2 MG/ML injection Inject 1 mL (0.2 mg total) into the vein every 12 (twelve) hours. 02/28/18 03/30/18 Yes Elodia Florence., MD  insulin aspart (NOVOLOG) 100 UNIT/ML injection Inject 0-9 Units into the skin every 4 (four) hours. Patient taking differently: Inject 0-9 Units  into the skin every 4 (four) hours. Per sliding scale at 0200, 0600, 1000, 1400, 1800 and 2200 02/28/18  Yes Elodia Florence., MD  insulin glargine (LANTUS) 100 UNIT/ML injection Inject 0.08 mLs (8 Units total) into the skin at bedtime. (continue to follow and adjust as pt may need less when no longer on steroids) 02/28/18  Yes Elodia Florence., MD  lisinopril (PRINIVIL,ZESTRIL) 20 MG tablet Take 1 tablet (20 mg total) by mouth daily. Patient taking differently: Place 20 mg into feeding tube daily.  02/02/18  Yes Donzetta Starch, NP  methylphenidate (RITALIN) 5 MG tablet Place 1 tablet (5 mg total) into feeding tube 2 (two) times daily with breakfast and lunch. Patient taking differently: Place 5 mg into feeding tube 2 (two) times daily with breakfast and lunch. 0800 and 1400 03/01/18 03/31/18 Yes Elodia Florence., MD  metoprolol tartrate (LOPRESSOR) 25 MG tablet Place 1 tablet (25 mg total) into feeding tube 2 (two) times daily. Patient taking differently: Place 25 mg into feeding tube 2 (two) times daily. 1000 and 2200 02/28/18 03/30/18 Yes Elodia Florence., MD  Multiple Vitamin (  MULTIVITAMIN) LIQD Place 15 mLs into feeding tube daily. 03/01/18 03/31/18 Yes Elodia Florence., MD  Nutritional Supplements (FEEDING SUPPLEMENT, JEVITY 1.5 CAL/FIBER,) LIQD Place 50 mL/hr into feeding tube continuous. Patient taking differently: Place 50 mL/hr into feeding tube continuous. 0700, 1500, 2300 02/28/18 03/30/18 Yes Elodia Florence., MD  nystatin (MYCOSTATIN) 100000 UNIT/ML suspension Use as directed 5 mLs (500,000 Units total) in the mouth or throat 4 (four) times daily. Patient taking differently: Use as directed 5 mLs in the mouth or throat 4 (four) times daily. 0000, 0600, 1200 and 1800 02/02/18  Yes Biby, Sharon L, NP  polyethylene glycol (MIRALAX / GLYCOLAX) packet Take 17 g by mouth daily. Patient taking differently: Place 17 g into feeding tube daily. Mix 17 g in 4 oz of water for  constipation 02/03/18  Yes Biby, Massie Kluver, NP  predniSONE (DELTASONE) 20 MG tablet Place 2 tablets (40 mg total) into feeding tube daily with breakfast for 5 days. 03/01/18 03/06/18 Yes Elodia Florence., MD  senna-docusate (SENOKOT-S) 8.6-50 MG tablet Take 1 tablet by mouth 2 (two) times daily. Patient taking differently: Place 1 tablet into feeding tube 2 (two) times daily.  02/02/18  Yes Donzetta Starch, NP  tiZANidine (ZANAFLEX) 4 MG tablet Place 4 mg into feeding tube at bedtime.  12/12/17  Yes [provider]  Water For Irrigation, Sterile (FREE WATER) SOLN Place 100 mLs into feeding tube every 3 (three) hours. Patient taking differently: Place 100 mLs into feeding tube every 3 (three) hours. 0100, 0400, 0700, 1000, 1300, 1600, 1900, 2200 02/28/18 03/30/18 Yes Powell, Kendra Opitz., MD  atorvastatin (LIPITOR) 20 MG tablet Take 1 tablet (20 mg total) by mouth daily at 6 PM. Patient not taking: Reported on 03/06/2018 02/02/18   Donzetta Starch, NP  diphenhydrAMINE (BENADRYL) 12.5 MG/5ML elixir Place 10 mLs (25 mg total) into feeding tube every 6 (six) hours as needed for itching. Patient not taking: Reported on 03/06/2018 02/28/18   Elodia Florence., MD  levofloxacin [facility]) 25 MG/ML solution Take 30 mLs (750 mg total) by mouth daily for 6 days. 03/07/18 03/13/18  Carmin Muskrat, MD  mouth rinse LIQD solution 15 mLs by Mouth Rinse route every 2 (two) hours. Patient not taking: Reported on 03/06/2018 02/02/18   Donzetta Starch, NP    Family History Family History  Problem Relation Age of Onset  . Cancer Mother        throat ca  . Cancer Father        lung ca  . Cancer Brother        throat ca  . Heart block Brother     Social History Social History   Tobacco Use  . Smoking status: Former Smoker    Packs/day: 1.00    Years: 25.00    Pack years: 25.00    Last attempt to quit: 07/12/1998    Years since quitting: 19.6  . Smokeless tobacco: Never Used  Substance Use Topics    . Alcohol use: No  . Drug use: No     Allergies   Cefepime and Flagyl [metronidazole]   Review of Systems Review of Systems  Constitutional:       Per HPI, otherwise negative  HENT:       Per HPI, otherwise negative  Respiratory:       Per HPI, otherwise negative  Cardiovascular:       Per HPI, otherwise negative  Gastrointestinal: Negative for vomiting.  Endocrine:       Negative aside from HPI  Genitourinary:       Neg aside from HPI   Musculoskeletal:       Per HPI, otherwise negative  Skin: Negative.   Neurological: Positive for weakness. Negative for syncope.     Physical Exam Updated Vital Signs BP 132/79   Pulse 95   Temp 98.3 F (36.8 C) (Axillary)   Resp (!) 22   Ht _0  (1.753 m)   Wt 67.6 kg   SpO2 95%   BMI 22.01 kg/m   Physical Exam  Constitutional: He is oriented to person, place, and time. He has a sickly appearance. He appears ill.  HENT:  Head: Normocephalic and atraumatic.    Eyes: Conjunctivae and EOM are normal.  Cardiovascular: Regular rhythm. Tachycardia present.  Pulmonary/Chest: No stridor. Tachypnea noted. He has decreased breath sounds.  Abdominal: He exhibits no distension.    Musculoskeletal: He exhibits no edema.  Neurological: He is alert and oriented to person, place, and time. He displays atrophy. He displays no tremor. He exhibits abnormal muscle tone. He displays no seizure activity.  Skin: Skin is warm and dry.  Psychiatric: He has a normal mood and affect.  Nursing note and vitals reviewed.    ED Treatments / Results  Labs (all labs ordered are listed, but only abnormal results are displayed) Labs Reviewed  COMPREHENSIVE METABOLIC PANEL - Abnormal; Notable for the following components:      Result Value   Sodium 147 (*)    Glucose, Bld 149 (*)    BUN 59 (*)    Creatinine, Ser 1.52 (*)    Albumin 3.3 (*)    GFR calc non Af Amer 45 (*)    GFR calc Af Amer 52 (*)    All other components within normal  limits  CBC WITH DIFFERENTIAL/PLATELET - Abnormal; Notable for the following components:   WBC 15.0 (*)    Hemoglobin 12.1 (*)    MCH 25.5 (*)    RDW 16.3 (*)    Platelets 665 (*)    Neutro Abs 13.4 (*)    All other components within normal limits  TROPONIN I  BRAIN NATRIURETIC PEPTIDE    EKG EKG Interpretation  Date/Time:  Monday March 06 2018 11:01:50 EDT Ventricular Rate:  95 PR Interval:    QRS Duration: 86 QT Interval:  345 QTC Calculation: 434 R Axis:   24 Text Interpretation:  Sinus rhythm Borderline short PR interval Artifact Abnormal ekg Confirmed by Carmin Muskrat [phone]) on 03/06/2018 11:26:13 AM   Radiology Dg Chest 2 View  Result Date: 03/06/2018 CLINICAL DATA:  Recent diagnosis of pneumonia 10 days ago, cough with thick sputum, shortness of breath, history nasopharyngeal cancer, hypertension, diabetes mellitus EXAM: CHEST - 2 VIEW COMPARISON:  02/25/2018 FINDINGS: Normal heart size, mediastinal contours, and pulmonary vascularity. Atherosclerotic calcifications aorta. Persistent infiltrates in the RIGHT upper and RIGHT lower lobes. Minimal streaky opacity at medial LEFT base question infiltrate versus atelectasis. Upper lungs clear. No pleural effusion or pneumothorax. IMPRESSION: Persistent lower RIGHT lung infiltrates and streaky LEFT basilar atelectasis versus infiltrate. Electronically Signed   By: Lavonia Dana M.D.   On: 03/06/2018 11:30    Procedures Procedures (including critical care time)  Medications Ordered in ED Medications  levofloxacin (LEVAQUIN) IVPB 750 mg (750 mg Intravenous New Bag/Given 03/06/18 1537)     Initial Impression / Assessment and Plan / ED Course  I have reviewed the triage vital signs and  the nursing notes.  Pertinent labs & imaging results that were available during my care of the patient were reviewed by me and considered in my medical decision making (see chart for details).      Chart and paper chart review notable for  recent hospitalization for pneumonia, assessment as chronic aspiration risk.  Patient accompanied by wife and daughter. I've met the family before.  We discussed this presentation, suspicion for brief episode of mucus plugging given his improvement following suctioning by EMS. Patient is afebrile, improved, we discussed all findings with him as well. X-ray slightly different from prior, but patient states he otherwise feels about the same as usual Discussed risks and benefits of hospitalization versus returning to his monitored nursing facility with ongoing antibiotics for possibility of pneumonia. Family patient have a preference for this, and given the fact that he is a monitored facility, and he will benefit from rehabilitation, and he currently has no evidence for new respiratory compromise, he will be returning there, with staff instructions to suction, as previously instructed, every 4 hours.  Final Clinical Impressions(s) / ED Diagnoses   Final diagnoses:  Pneumonia of left lower lobe due to infectious organism [facility])    ED Discharge Orders         Ordered    levofloxacin (LEVAQUIN) 25 MG/ML solution  Daily     03/06/18 1521           Carmin Muskrat, MD 03/06/18 1601

## 2018-03-22 ENCOUNTER — Encounter: Payer: Self-pay | Admitting: Internal Medicine

## 2018-03-22 ENCOUNTER — Non-Acute Institutional Stay (SKILLED_NURSING_FACILITY): Payer: Medicare Other | Admitting: Internal Medicine

## 2018-03-22 DIAGNOSIS — J189 Pneumonia, unspecified organism: Secondary | ICD-10-CM

## 2018-03-22 DIAGNOSIS — E119 Type 2 diabetes mellitus without complications: Secondary | ICD-10-CM

## 2018-03-22 DIAGNOSIS — I610 Nontraumatic intracerebral hemorrhage in hemisphere, subcortical: Secondary | ICD-10-CM

## 2018-03-22 DIAGNOSIS — I611 Nontraumatic intracerebral hemorrhage in hemisphere, cortical: Secondary | ICD-10-CM | POA: Diagnosis not present

## 2018-03-22 DIAGNOSIS — J690 Pneumonitis due to inhalation of food and vomit: Secondary | ICD-10-CM

## 2018-03-22 DIAGNOSIS — R6889 Other general symptoms and signs: Secondary | ICD-10-CM

## 2018-03-22 DIAGNOSIS — I69351 Hemiplegia and hemiparesis following cerebral infarction affecting right dominant side: Secondary | ICD-10-CM

## 2018-03-22 DIAGNOSIS — G936 Cerebral edema: Secondary | ICD-10-CM

## 2018-03-22 DIAGNOSIS — J439 Emphysema, unspecified: Secondary | ICD-10-CM | POA: Insufficient documentation

## 2018-03-22 DIAGNOSIS — I69391 Dysphagia following cerebral infarction: Secondary | ICD-10-CM

## 2018-03-22 DIAGNOSIS — E1169 Type 2 diabetes mellitus with other specified complication: Secondary | ICD-10-CM

## 2018-03-22 DIAGNOSIS — I615 Nontraumatic intracerebral hemorrhage, intraventricular: Secondary | ICD-10-CM

## 2018-03-22 DIAGNOSIS — E785 Hyperlipidemia, unspecified: Secondary | ICD-10-CM

## 2018-03-22 DIAGNOSIS — J432 Centrilobular emphysema: Secondary | ICD-10-CM

## 2018-03-22 DIAGNOSIS — I9589 Other hypotension: Secondary | ICD-10-CM

## 2018-03-22 DIAGNOSIS — G4737 Central sleep apnea in conditions classified elsewhere: Secondary | ICD-10-CM | POA: Insufficient documentation

## 2018-03-22 DIAGNOSIS — I619 Nontraumatic intracerebral hemorrhage, unspecified: Secondary | ICD-10-CM | POA: Diagnosis not present

## 2018-03-22 DIAGNOSIS — E871 Hypo-osmolality and hyponatremia: Secondary | ICD-10-CM

## 2018-03-22 DIAGNOSIS — N183 Chronic kidney disease, stage 3 unspecified: Secondary | ICD-10-CM

## 2018-03-22 DIAGNOSIS — I69398 Other sequelae of cerebral infarction: Secondary | ICD-10-CM

## 2018-03-22 DIAGNOSIS — R4702 Dysphasia: Secondary | ICD-10-CM

## 2018-03-22 DIAGNOSIS — D638 Anemia in other chronic diseases classified elsewhere: Secondary | ICD-10-CM

## 2018-03-22 DIAGNOSIS — N179 Acute kidney failure, unspecified: Secondary | ICD-10-CM

## 2018-03-22 DIAGNOSIS — G935 Compression of brain: Secondary | ICD-10-CM

## 2018-03-22 DIAGNOSIS — I69320 Aphasia following cerebral infarction: Secondary | ICD-10-CM

## 2018-03-22 DIAGNOSIS — J9601 Acute respiratory failure with hypoxia: Secondary | ICD-10-CM

## 2018-03-22 DIAGNOSIS — I161 Hypertensive emergency: Secondary | ICD-10-CM

## 2018-03-22 DIAGNOSIS — E870 Hyperosmolality and hypernatremia: Secondary | ICD-10-CM

## 2018-03-22 NOTE — Progress Notes (Addendum)
Location:  [facility] Room Number: 112-P Place of Service:  SNF (31)Skilled nursing facility Daughter: Hennie Duos MD  PCP: Lawerance Cruel, MD Patient Care Team: Lawerance Cruel, MD as PCP - General (Family Medicine) Heath Lark, MD as Consulting Physician (Hematology and Oncology) Arloa Koh, MD as Consulting Physician (Radiation Oncology) Karie Mainland, RD as Dietitian (Nutrition)  Extended Emergency Contact Information Primary Emergency Contact: Chiao,Cathy Address: [place], [place] [address] [address] Phone: [phone] Mobile Phone: [phone] Relation: Spouse Secondary Emergency Contact: Gargus, Summer Mobile Phone: [phone] Relation: Daughter  Allergies  Allergen Reactions  . Cefepime     Urticarial rash - unclear cause, had been receiving cefepime for several days and had also had flagyl as well  . Flagyl [Metronidazole]     Urticarial rash - unclear, was also receiving cefepime     Chief Complaint  Patient presents with  . Discharge Note    Patient to discharge home on 03/24/18    HPI:  72 y.o. male with history of nasopharyngeal cancer status post radiation, hypertension, diabetes, hyperlipidemia, who presented to [facility] and found to have a left frontoparietal bleed.  Patient was admitted to [facility] from 7/18-8/15 where an MRI was attempted but was unable to be performed secondary to patient's continually dropping of his heart rate into the 30s.  Patient was admitted to the ICU with global aphasia, right hemi-plegia and right sided neglect.  He improved well enough to be transferred to CIR.  On 7/25 he was found to be unresponsive with a blood pressure of 76/48.  Patient had continuous problems throughout his stay with blood pressures being extremely high then extremely low and he was transferred in and out of CIR several times.  On 8/10  he was found to have bibasilar pneumonia and was started on cefepime and Flagyl for possible aspiration pneumonia.  Patient developed a rash which was felt to be a drug rash which did not cause any problems other than being itchy.  Patient improved on antibiotics and was able to be weaned off oxygen to room air.  Patient had a PEG tube placed and feedings were started.  Patient was seen by palliative care who started Robinul for excessive secretions.  Patient was started on prednisone on 8/19 for his rash which improved by 8/20.  Patient did not make any gains in therapy while in CR and so skilled nursing facility was recommended.  Patient was admitted to skilled nursing facility for OT/PT and is now ready to be DC'd to home.     Past Medical History:  Diagnosis Date  . Anxiety    Claustrophobia  . Arthritis   . Cancer (HCC)    nasopharynx  . Chronic cough    EXCESS MUCUS BUILDUP SINCE ORAL SURGERY FROM CANCER   . Chronic kidney disease    Renal Insuffiency , Creatinine has been in the 2- 3 range, 11/27/14 Creatine 1.4  . Diabetes mellitus without complication ([place])    Type II  . Gout   . History of radiation therapy 12/23/2014- 02/10/2015   Nasopharynx / Neck Adenopathy  . Hyperlipidemia   . Hypertension   . Inguinal hernia   . Right inguinal hernia 08/23/2017    Past Surgical History:  Procedure Laterality Date  . colonoscopy    . COLONOSCOPY W/ POLYPECTOMY    . INGUINAL  HERNIA REPAIR Right 08/23/2017   Procedure: OPEN REPAIR RIGHT INGUINAL HERNIA WITH MESH;  Surgeon: Fanny Skates, MD;  Location: [facility];  Service: General;  Laterality: Right;  GENERAL AND TAP BLOCK  . INSERTION OF MESH Right 08/23/2017   Procedure: INSERTION OF MESH;  Surgeon: Fanny Skates, MD;  Location: [facility];  Service: General;  Laterality: Right;  GENERAL AND TAP BLOCK  . IR GASTROSTOMY TUBE MOD SED  02/06/2018  . KNEE SURGERY Left 1990's   tibia crushed, pinned and bone grafted  . LYMPH NODE BIOPSY    .  MULTIPLE EXTRACTIONS WITH ALVEOLOPLASTY N/A 12/06/2014   Procedure: Extraction of tooth #'s 2,4,5,6,7,8,9,10,11,12,13,14,15, 20,22,26,27,28,29 with alveoloplasty.;  Surgeon: Lenn Cal, DDS;  Location: [facility];  Service: Oral Surgery;  Laterality: N/A;     reports that he quit smoking about 19 years ago. He has a 25.00 pack-year smoking history. He has never used smokeless tobacco. He reports that he does not drink alcohol or use drugs. Social History   Socioeconomic History  . Marital status: Married    Spouse name: Not on file  . Number of children: 1  . Years of education: Not on file  . Highest education level: Not on file  Occupational History  . Not on file  Social Needs  . Financial resource strain: Not on file  . Food insecurity:    Worry: Patient refused    Inability: Patient refused  . Transportation needs:    Medical: Patient refused    Non-medical: Patient refused  Tobacco Use  . Smoking status: Former Smoker    Packs/day: 1.00    Years: 25.00    Pack years: 25.00    Last attempt to quit: 07/12/1998    Years since quitting: 19.7  . Smokeless tobacco: Never Used  Substance and Sexual Activity  . Alcohol use: No  . Drug use: No  . Sexual activity: Not Currently  Lifestyle  . Physical activity:    Days per week: Patient refused    Minutes per session: Patient refused  . Stress: Not on file  Relationships  . Social connections:    Talks on phone: Patient refused    Gets together: Patient refused    Attends religious service: Patient refused    Active member of club or organization: Patient refused    Attends meetings of clubs or organizations: Patient refused    Relationship status: Patient refused  . Intimate partner violence:    Fear of current or ex partner: Patient refused    Emotionally abused: Patient refused    Physically abused: Patient refused    Forced sexual activity: Patient refused  Other Topics Concern  . Not on file  Social History  Narrative   Patient is married and had 1 child. 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    Pertinent  Health Maintenance Due  Topic Date Due  . FOOT EXAM  12/30/1955  . OPHTHALMOLOGY EXAM  [birth year]/30/1955  . COLONOSCOPY  12/30/1997  . PNA vac Low Risk Adult (1 of 2 - PCV13) 12/30/2012  . INFLUENZA VACCINE  02/09/2018  . HEMOGLOBIN A1C  07/30/2018    Medications: Allergies as of 03/22/2018      Reactions   Cefepime    Urticarial rash - unclear cause, had been receiving cefepime for several days and had also had flagyl as well   Flagyl [metronidazole]    Urticarial rash - unclear,  was also receiving cefepime      Medication List        Accurate as of 03/22/18 11:59 PM. Always use your most recent med list.          amLODipine 10 MG tablet Commonly known as:  NORVASC Take 1 tablet (10 mg total) by mouth daily.   artificial tears Oint ophthalmic ointment Commonly known as:  LACRILUBE Place into both eyes every 3 (three) hours as needed for dry eyes.   chlorhexidine 0.12 % solution Commonly known as:  PERIDEX 15 mLs by Mouth Rinse route 2 (two) times daily.   feeding supplement (JEVITY 1.5 CAL/FIBER) Liqd Place 50 mL/hr into feeding tube continuous.   feeding supplement (PRO-STAT SUGAR FREE 64) Liqd Place 30 mLs into feeding tube 2 (two) times daily.   fenofibrate 160 MG tablet Place 160 mg into feeding tube daily.   free water Soln Place 100 mLs into feeding tube every 3 (three) hours.   glycopyrrolate 0.2 MG/ML injection Commonly known as:  ROBINUL Inject 1 mL (0.2 mg total) into the vein every 12 (twelve) hours.   insulin aspart 100 UNIT/ML injection Commonly known as:  novoLOG Inject 0-9 Units into the skin every 4 (four) hours.   insulin glargine 100 UNIT/ML injection Commonly known as:  LANTUS Inject 0.08 mLs (8 Units total) into the skin at bedtime. (continue to follow and adjust as pt  may need less when no longer on steroids)   lisinopril 20 MG tablet Commonly known as:  PRINIVIL,ZESTRIL Take 1 tablet (20 mg total) by mouth daily.   metFORMIN 500 MG tablet Commonly known as:  GLUCOPHAGE Place 500 mg into feeding tube daily with breakfast.   methocarbamol 500 MG tablet Commonly known as:  ROBAXIN Place 500 mg into feeding tube every 6 (six) hours as needed for muscle spasms.   methylphenidate 5 MG tablet Commonly known as:  RITALIN Place 1 tablet (5 mg total) into feeding tube 2 (two) times daily with breakfast and lunch.   metoprolol tartrate 25 MG tablet Commonly known as:  LOPRESSOR Place 1 tablet (25 mg total) into feeding tube 2 (two) times daily.   mouth rinse Liqd solution 15 mLs by Mouth Rinse route every 2 (two) hours.   multivitamin Liqd Place 15 mLs into feeding tube daily.   nystatin 100000 UNIT/ML suspension Commonly known as:  MYCOSTATIN Use as directed 5 mLs (500,000 Units total) in the mouth or throat 4 (four) times daily.   polyethylene glycol packet Commonly known as:  MIRALAX / GLYCOLAX Take 17 g by mouth daily.   senna-docusate 8.6-50 MG tablet Commonly known as:  Senokot-S Take 1 tablet by mouth 2 (two) times daily.   tiZANidine 4 MG tablet Commonly known as:  ZANAFLEX Place 4 mg into feeding tube at bedtime.   TYLENOL 500 MG tablet Generic drug:  acetaminophen Place 1,000 mg into feeding tube 3 (three) times daily as needed for moderate pain.        Vitals:   03/22/18 1312  BP: 122/80  Pulse: 78  Resp: 18  Temp: 98 F (36.7 C)  TempSrc: Oral  SpO2: 92%  Weight: 126 lb 6.4 oz (57.3 kg)  Height: 5\' 9"  (1.753 m)   Body mass index is 18.67 kg/m.  Physical Exam  GENERAL APPEARANCE: Alert, conversant. No acute distress.  HEENT: Unremarkable. RESPIRATORY: Breathing is even, unlabored. Lung sounds are clear   CARDIOVASCULAR: Heart RRR no murmurs, rubs or gallops. No peripheral edema.  GASTROINTESTINAL:  Abdomen  is soft, non-tender, not distended w/ normal bowel sounds: PEG tube.  NEUROLOGIC: Cranial nerves 2-12 grossly intact: Right-sided weakness   Labs reviewed: Basic Metabolic Panel: Recent Labs    01/27/18 0459 01/27/18 1739  02/27/18 0819 02/28/18 1001 03/01/18 0518 03/06/18 03/06/18 1130  NA 139  --    < > 138 139 141 144 147*  K 4.1  --    < > 4.2 4.4 5.0 4.1 3.7  CL 104  --    < > 99 101 101  --  102  CO2 22  --    < > 30 26 31   --  31  GLUCOSE 155*  --    < > 211* 206* 248*  --  149*  BUN 40*  --    < > 54* 57* 61* 56* 59*  CREATININE 2.64*  --    < > 1.66* 1.53* 1.65* 1.4* 1.52*  CALCIUM 9.7  --    < > 9.0 9.3 9.5  --  10.0  MG 2.4 2.9*   < > 2.2 2.2 2.4  --   --   PHOS 4.7* 5.0*  --   --   --   --   --   --    < > = values in this interval not displayed.   No results found for: [facility] Liver Function Tests: Recent Labs    02/25/18 0547 02/27/18 0819 03/06/18 1130  AST 35 53* 26  ALT 38 43 29  ALKPHOS 66 56 54  BILITOT 0.3 0.3 0.5  PROT 6.6 6.3* 7.6  ALBUMIN 2.0* 2.2* 3.3*   No results for input(s): LIPASE, AMYLASE in the last 8760 hours. No results for input(s): AMMONIA in the last 8760 hours. CBC: Recent Labs    01/26/18 0913  02/03/18 0551  02/28/18 1001 03/01/18 0518 03/06/18 03/06/18 1130  WBC 5.8   < > 8.8   < > 6.4 8.6 10.5 15.0*  NEUTROABS 2.8  --  6.3  --   --   --   --  13.4*  HGB 12.4*   < > 12.6*   < > 9.6* 9.6* 11.4* 12.1*  HCT 39.6   < > 42.5   < > 30.6* 31.6* 36* 39.1  MCV 82.2   < > 87.1   < > 79.7 82.3  --  82.3  PLT 324   < > 276   < > 496* 478* 581* 665*   < > = values in this interval not displayed.   Lipid Recent Labs    01/27/18 0459 01/30/18 0251 02/02/18 0436  CHOL 231*  --   --   HDL 49  --   --   LDLCALC 111*  --   --   TRIG 357*  348* 142 133   Cardiac Enzymes: Recent Labs    03/06/18 1130  TROPONINI <0.03   BNP: Recent Labs    03/06/18 1130  BNP 96.3   CBG: Recent Labs    03/02/18 0359  03/02/18 0733 03/02/18 1126  GLUCAP 169* 159* 216*    Procedures and Imaging Studies During Stay: Dg Chest 2 View  Result Date: 03/06/2018 CLINICAL DATA:  Recent diagnosis of pneumonia 10 days ago, cough with thick sputum, shortness of breath, history nasopharyngeal cancer, hypertension, diabetes mellitus EXAM: CHEST - 2 VIEW COMPARISON:  02/25/2018 FINDINGS: Normal heart size, mediastinal contours, and pulmonary vascularity. Atherosclerotic calcifications aorta. Persistent infiltrates in the RIGHT upper and RIGHT lower lobes. Minimal streaky opacity  at medial LEFT base question infiltrate versus atelectasis. Upper lungs clear. No pleural effusion or pneumothorax. IMPRESSION: Persistent lower RIGHT lung infiltrates and streaky LEFT basilar atelectasis versus infiltrate. Electronically Signed   By: Lavonia Dana M.D.   On: 03/06/2018 11:30   Dg Chest Port 1 View  Result Date: 02/25/2018 CLINICAL DATA:  Pneumonia. EXAM: PORTABLE CHEST 1 VIEW COMPARISON:  02/23/2018 FINDINGS: Airspace opacification of the right mid lung and right base with slight improvement. Improving medial left basilar opacification. Cardiomediastinal silhouette and remainder the exam is unchanged. IMPRESSION: Improving bilateral multifocal airspace process likely improving pneumonia. Electronically Signed   By: Marin Olp M.D.   On: 02/25/2018 16:22    Assessment/Plan:   Nontraumatic cortical hemorrhage of left cerebral hemisphere (HCC)  Centrilobular emphysema (HCC)  Hemiparesis affecting right side as late effect of stroke (HCC)  Intraparenchymal hemorrhage of brain (HCC)  Hypertensive emergency  Nontraumatic subcortical hemorrhage of left cerebral hemisphere [facility])  Brain herniation (HCC)  IVH (intraventricular hemorrhage) (HCC)  Cytotoxic brain edema (HCC)  Aspiration pneumonia of both lower lobes due to gastric secretions (HCC)  Acute respiratory failure with hypoxia (HCC)  Other specified  hypotension  Hyperlipidemia associated with type 2 diabetes mellitus (HCC)  Copious oral secretions  Pneumonia due to infectious organism, unspecified laterality, unspecified part of lung  Anemia of chronic disease  Hypernatremia  Dysphasia  Dysphagia, post-stroke  Aphasia, post-stroke  Hyponatremia  AKI (acute kidney injury) ([place])  Diabetes mellitus type 2 in nonobese ([place])  Chronic kidney disease, stage III (moderate) ([place])   Patient is being discharged with the following home health services: OT/PT/nursing/aid/social work  Patient is being discharged with the following durable medical equipment: Semi-electric hospital bed with one half side rails and trapezius bar, mechanical lift, high-back reclining wheelchair with cushion and detachable arms, tube feed supplies, portable suctioning machine with supplies, 3 and 1 commode with drop arms  Patient has been advised to f/u with their PCP in 1-2 weeks to bring them up to date on their rehab stay.  Social services at facility was responsible for arranging this appointment.  Pt was provided with a 30 day supply of prescriptions for medications and refills must be obtained from their PCP.  For controlled substances, a more limited supply may be provided adequate until PCP appointment only.  Medications have been reconciled.  Time spent greater than 30 minutes;> 50% of time with patient was spent reviewing records, labs, tests and studies, counseling and developing plan of care  Inocencio Homes, MD

## 2018-03-23 ENCOUNTER — Other Ambulatory Visit: Payer: Self-pay | Admitting: Internal Medicine

## 2018-03-23 DIAGNOSIS — R633 Feeding difficulties, unspecified: Secondary | ICD-10-CM

## 2018-03-26 ENCOUNTER — Encounter: Payer: Self-pay | Admitting: Internal Medicine

## 2018-03-29 ENCOUNTER — Non-Acute Institutional Stay (SKILLED_NURSING_FACILITY): Payer: Medicare Other | Admitting: Internal Medicine

## 2018-03-29 ENCOUNTER — Encounter: Payer: Self-pay | Admitting: Internal Medicine

## 2018-03-29 DIAGNOSIS — I10 Essential (primary) hypertension: Secondary | ICD-10-CM

## 2018-03-29 DIAGNOSIS — N183 Chronic kidney disease, stage 3 (moderate): Secondary | ICD-10-CM

## 2018-03-29 DIAGNOSIS — E1122 Type 2 diabetes mellitus with diabetic chronic kidney disease: Secondary | ICD-10-CM | POA: Diagnosis not present

## 2018-03-29 DIAGNOSIS — I69351 Hemiplegia and hemiparesis following cerebral infarction affecting right dominant side: Secondary | ICD-10-CM | POA: Diagnosis not present

## 2018-03-29 NOTE — Progress Notes (Signed)
Location:  [facility] Room Number: 161W Place of Service:  SNF (31)  Noah Delaine. [name], MD  Patient Care Team: Lawerance Cruel, MD as PCP - General (Family Medicine) Heath Lark, MD as Consulting Physician (Hematology and Oncology) Arloa Koh, MD as Consulting Physician (Radiation Oncology) Karie Mainland, RD as Dietitian (Nutrition)  Extended Emergency Contact Information Primary Emergency Contact: Bova,Cathy Address: [place], [place] [address] Johnnette Litter of [place] Phone: [phone] Mobile Phone: [phone] Relation: Spouse Secondary Emergency Contact: [place], Summer Mobile Phone: [phone] Relation: Daughter    Allergies: Cefepime and Flagyl [metronidazole]  Chief Complaint  Patient presents with  . Medical Management of Chronic Issues    Routine Visit    HPI: Patient is 72 y.o. male who is being seen for routine issues of diabetes mellitus type 2, hypertension, and right-sided hemiparesis status post ICH  Past Medical History:  Diagnosis Date  . Anxiety    Claustrophobia  . Arthritis   . Cancer (HCC)    nasopharynx  . Chronic cough    EXCESS MUCUS BUILDUP SINCE ORAL SURGERY FROM CANCER   . Chronic kidney disease    Renal Insuffiency , Creatinine has been in the 2- 3 range, 11/27/14 Creatine 1.4  . Diabetes mellitus without complication ([place])    Type II  . Gout   . History of radiation therapy 12/23/2014- 02/10/2015   Nasopharynx / Neck Adenopathy  . Hyperlipidemia   . Hypertension   . Inguinal hernia   . Right inguinal hernia 08/23/2017    Past Surgical History:  Procedure Laterality Date  . colonoscopy    . COLONOSCOPY W/ POLYPECTOMY    . INGUINAL HERNIA REPAIR Right 08/23/2017   Procedure: OPEN REPAIR RIGHT INGUINAL HERNIA WITH MESH;  Surgeon: Fanny Skates, MD;  Location: [facility];  Service: General;  Laterality: Right;  GENERAL AND TAP BLOCK  . INSERTION OF MESH Right  08/23/2017   Procedure: INSERTION OF MESH;  Surgeon: Fanny Skates, MD;  Location: [facility];  Service: General;  Laterality: Right;  GENERAL AND TAP BLOCK  . IR GASTROSTOMY TUBE MOD SED  02/06/2018  . KNEE SURGERY Left 1990's   tibia crushed, pinned and bone grafted  . LYMPH NODE BIOPSY    . MULTIPLE EXTRACTIONS WITH ALVEOLOPLASTY N/A 12/06/2014   Procedure: Extraction of tooth #'s 2,4,5,6,7,8,9,10,11,12,13,14,15, 20,22,26,27,28,29 with alveoloplasty.;  Surgeon: Lenn Cal, DDS;  Location: [facility];  Service: Oral Surgery;  Laterality: N/A;    Allergies as of 03/29/2018      Reactions   Cefepime    Urticarial rash - unclear cause, had been receiving cefepime for several days and had also had flagyl as well   Flagyl [metronidazole]    Urticarial rash - unclear, was also receiving cefepime      Medication List        Accurate as of 03/29/18 11:59 PM. Always use your most recent med list.          amLODipine 10 MG tablet Commonly known as:  NORVASC Take 1 tablet (10 mg total) by mouth daily.   artificial tears Oint ophthalmic ointment Commonly known as:  LACRILUBE Place into both eyes every 3 (three) hours as needed for dry eyes.   chlorhexidine 0.12 % solution Commonly known as:  PERIDEX 15 mLs by Mouth Rinse route 2 (two) times daily.   feeding supplement (JEVITY 1.5 CAL/FIBER) Liqd Place 50 mL/hr  into feeding tube continuous.   feeding supplement (PRO-STAT SUGAR FREE 64) Liqd Place 30 mLs into feeding tube 2 (two) times daily.   fenofibrate 160 MG tablet Place 160 mg into feeding tube daily.   free water Soln Place 100 mLs into feeding tube every 3 (three) hours.   glycopyrrolate 0.2 MG/ML injection Commonly known as:  ROBINUL Inject 1 mL (0.2 mg total) into the vein every 12 (twelve) hours.   lisinopril 20 MG tablet Commonly known as:  PRINIVIL,ZESTRIL Take 1 tablet (20 mg total) by mouth daily.   metFORMIN 500 MG tablet Commonly known as:  GLUCOPHAGE Place  500 mg into feeding tube daily with breakfast.   methocarbamol 500 MG tablet Commonly known as:  ROBAXIN Place 500 mg into feeding tube every 6 (six) hours as needed for muscle spasms.   methylphenidate 5 MG tablet Commonly known as:  RITALIN Place 1 tablet (5 mg total) into feeding tube 2 (two) times daily with breakfast and lunch.   metoprolol tartrate 25 MG tablet Commonly known as:  LOPRESSOR Place 1 tablet (25 mg total) into feeding tube 2 (two) times daily.   mouth rinse Liqd solution 15 mLs by Mouth Rinse route every 2 (two) hours.   multivitamin Liqd Place 15 mLs into feeding tube daily.   nystatin 100000 UNIT/ML suspension Commonly known as:  MYCOSTATIN Use as directed 5 mLs (500,000 Units total) in the mouth or throat 4 (four) times daily.   polyethylene glycol packet Commonly known as:  MIRALAX / GLYCOLAX Take 17 g by mouth daily.   senna-docusate 8.6-50 MG tablet Commonly known as:  Senokot-S Take 1 tablet by mouth 2 (two) times daily.   tiZANidine 4 MG tablet Commonly known as:  ZANAFLEX Place 4 mg into feeding tube at bedtime.   TYLENOL 500 MG tablet Generic drug:  acetaminophen Place 1,000 mg into feeding tube 3 (three) times daily as needed for moderate pain.       No orders of the defined types were placed in this encounter.    There is no immunization history on file for this patient.  Social History   Tobacco Use  . Smoking status: Former Smoker    Packs/day: 1.00    Years: 25.00    Pack years: 25.00    Last attempt to quit: 07/12/1998    Years since quitting: 19.7  . Smokeless tobacco: Never Used  Substance Use Topics  . Alcohol use: No    Review of Systems  DATA OBTAINED: from patient- limited from expressive aphasia and dysarthria; nursing-no acute concerns GENERAL:  no fevers, fatigue, appetite changes SKIN: No itching, rash HEENT: No complaint RESPIRATORY: No cough, wheezing, SOB CARDIAC: No chest pain, palpitations, lower  extremity edema  GI: No abdominal pain, No N/V/D or constipation, No heartburn or reflux  GU: No dysuria, frequency or urgency, or incontinence  MUSCULOSKELETAL: No unrelieved bone/joint pain NEUROLOGIC: No headache, dizziness  PSYCHIATRIC: No overt anxiety or sadness  Vitals:   03/29/18 1439  BP: 127/75  Pulse: 70  Resp: 18  Temp: (!) 96.6 F (35.9 C)   Body mass index is 18.67 kg/m. Physical Exam  GENERAL APPEARANCE: Alert,  No acute distress  SKIN: No diaphoresis rash HEENT: Unremarkable RESPIRATORY: Breathing is even, unlabored. Lung sounds are clear   CARDIOVASCULAR: Heart RRR no murmurs, rubs or gallops. No peripheral edema  GASTROINTESTINAL: Abdomen is soft, non-tender, not distended w/ normal bowel sounds.  GENITOURINARY: Bladder non tender, not distended  MUSCULOSKELETAL: No abnormal  joints or musculature NEUROLOGIC: Expressive a aphasia, dysarthria, flaccid right-sided weakness PSYCHIATRIC: Mood and affect appropriate to situation, no behavioral issues  Patient Active Problem List   Diagnosis Date Noted  . 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 Emphysema of lung ([place]) 03/22/2018  . CVA (cerebrovascular accident due to intracerebral hemorrhage) ([place]) 03/05/2018  . Acute respiratory failure with hypoxia ([place]) 03/05/2018  . Hypotension 03/05/2018  . Hyperlipidemia associated with type 2 diabetes mellitus ([place]) 03/05/2018  . Copious oral secretions   . Goals of care, counseling/discussion   . Palliative care by specialist   . Malnutrition of moderate degree 02/24/2018  . Pneumonia of both lungs due to infectious organism 02/23/2018  . Pneumonia due to infectious organism 02/23/2018  . Aspiration pneumonia ([place]) 02/22/2018  . Aspiration pneumonia of right lower lobe due to milk ([place])   . Dysphasia   . Anemia of chronic disease   . Hypernatremia   . Transaminitis   . Hypertensive emergency 02/02/2018  . Intraparenchymal  hemorrhage of brain ([place]) 02/02/2018  . Hemiparesis affecting right side as late effect of stroke ([place])   . Aphasia, post-stroke   . Dysphagia, post-stroke   . Nontraumatic subcortical hemorrhage of left cerebral hemisphere ([place])   . Diabetes mellitus type 2 in nonobese (HCC)   . AKI (acute kidney injury) ([place])   . Hyponatremia   . Chronic kidney disease   . Cytotoxic brain edema ([place]) 01/28/2018  . Brain herniation ([place]) 01/28/2018  . IVH (intraventricular hemorrhage) ([place]) 01/27/2018  . Right inguinal hernia 08/23/2017  . Chronic kidney disease, stage III (moderate) ([place]) 02/18/2017  . 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 Other fatigue 06/18/2016  . Essential hypertension 10/17/2015  . Constipation 02/06/2015  . Drug-induced skin rash 02/03/2015  . Mucositis due to chemotherapy 01/24/2015  . Hyperglycemia due to type 2 diabetes mellitus ([place]) 01/07/2015  . Chronic anxiety 12/19/2014  . Stage 2 chronic renal impairment associated with type 2 diabetes mellitus ([place]) 12/19/2014  . Type 2 diabetes mellitus, controlled, with renal complications ([place]) 66/44/0347  . History of nasopharyngeal cancer 11/26/2014    CMP     Component Value Date/Time   NA 147 (H) 03/06/2018 1130   NA 144 03/06/2018   NA 139 02/18/2017 1502   K 3.7 03/06/2018 1130   K 4.3 02/18/2017 1502   CL 102 03/06/2018 1130   CO2 31 03/06/2018 1130   CO2 27 02/18/2017 1502   GLUCOSE 149 (H) 03/06/2018 1130   GLUCOSE 108 02/18/2017 1502   BUN 59 (H) 03/06/2018 1130   BUN 56 (A) 03/06/2018   BUN 30.9 (H) 02/18/2017 1502   CREATININE 1.52 (H) 03/06/2018 1130   CREATININE 1.6 (H) 02/18/2017 1502   CALCIUM 10.0 03/06/2018 1130   CALCIUM 9.6 02/18/2017 1502   PROT 7.6 03/06/2018 1130   PROT 7.0 02/18/2017 1502   ALBUMIN 3.3 (L) 03/06/2018 1130   ALBUMIN 3.0 (L) 02/18/2017 1502   AST 26 03/06/2018 1130   AST 16 02/18/2017 1502   ALT 29 03/06/2018 1130    ALT 12 02/18/2017 1502   ALKPHOS 54 03/06/2018 1130   ALKPHOS 58 02/18/2017 1502   BILITOT 0.5 03/06/2018 1130   BILITOT 0.25 02/18/2017 1502   GFRNONAA 45 (L) 03/06/2018 1130   GFRAA 52 (L) 03/06/2018 1130   Recent Labs    01/27/18 0459 01/27/18 1739  02/27/18 0819 02/28/18 1001 03/01/18 0518 03/06/18 03/06/18 1130  NA  139  --    < > 138 139 141 144 147*  K 4.1  --    < > 4.2 4.4 5.0 4.1 3.7  CL 104  --    < > 99 101 101  --  102  CO2 22  --    < > 30 26 31   --  31  GLUCOSE 155*  --    < > 211* 206* 248*  --  149*  BUN 40*  --    < > 54* 57* 61* 56* 59*  CREATININE 2.64*  --    < > 1.66* 1.53* 1.65* 1.4* 1.52*  CALCIUM 9.7  --    < > 9.0 9.3 9.5  --  10.0  MG 2.4 2.9*   < > 2.2 2.2 2.4  --   --   PHOS 4.7* 5.0*  --   --   --   --   --   --    < > = values in this interval not displayed.   Recent Labs    02/25/18 0547 02/27/18 0819 03/06/18 1130  AST 35 53* 26  ALT 38 43 29  ALKPHOS 66 56 54  BILITOT 0.3 0.3 0.5  PROT 6.6 6.3* 7.6  ALBUMIN 2.0* 2.2* 3.3*   Recent Labs    01/26/18 0913  02/03/18 0551  02/28/18 1001 03/01/18 0518 03/06/18 03/06/18 1130  WBC 5.8   < > 8.8   < > 6.4 8.6 10.5 15.0*  NEUTROABS 2.8  --  6.3  --   --   --   --  13.4*  HGB 12.4*   < > 12.6*   < > 9.6* 9.6* 11.4* 12.1*  HCT 39.6   < > 42.5   < > 30.6* 31.6* 36* 39.1  MCV 82.2   < > 87.1   < > 79.7 82.3  --  82.3  PLT 324   < > 276   < > 496* 478* 581* 665*   < > = values in this interval not displayed.   Recent Labs    01/27/18 0459 01/30/18 0251 02/02/18 0436  CHOL 231*  --   --   LDLCALC 111*  --   --   TRIG 357*  348* 142 133   No results found for: [facility] Lab Results  Component Value Date   TSH 2.745 02/18/2017   Lab Results  Component Value Date   HGBA1C 6.0 (H) 01/27/2018   Lab Results  Component Value Date   CHOL 231 (H) 01/27/2018   HDL 49 01/27/2018   LDLCALC 111 (H) 01/27/2018   TRIG 133 02/02/2018   CHOLHDL 4.7 01/27/2018    Significant  Diagnostic Results in last 30 days:  Dg Chest 2 View  Result Date: 03/06/2018 CLINICAL DATA:  Recent diagnosis of pneumonia 10 days ago, cough with thick sputum, shortness of breath, history nasopharyngeal cancer, hypertension, diabetes mellitus EXAM: CHEST - 2 VIEW COMPARISON:  02/25/2018 FINDINGS: Normal heart size, mediastinal contours, and pulmonary vascularity. Atherosclerotic calcifications aorta. Persistent infiltrates in the RIGHT upper and RIGHT lower lobes. Minimal streaky opacity at medial LEFT base question infiltrate versus atelectasis. Upper lungs clear. No pleural effusion or pneumothorax. IMPRESSION: Persistent lower RIGHT lung infiltrates and streaky LEFT basilar atelectasis versus infiltrate. Electronically Signed   By: Lavonia Dana M.D.   On: 03/06/2018 11:30    Assessment and Plan  Type 2 diabetes mellitus, controlled, with renal complications T0W 6.o: Continue metformin 500 mg every morning: Patient  is on ACE, not on statin due to intolerance  Essential hypertension Controlled on multiple meds; continue Norvasc 10 mg daily lisinopril 20 mg daily metoprolol 25 mg twice daily  Hemiparesis affecting right side as late effect of stroke [facility]) Patient is learning to use his left side to compensate, right side has not improved still flaccid 0/5 strength `    Brynlea Spindler D. [name], MD

## 2018-03-31 ENCOUNTER — Other Ambulatory Visit (HOSPITAL_COMMUNITY): Payer: Self-pay | Admitting: Interventional Radiology

## 2018-03-31 ENCOUNTER — Ambulatory Visit (HOSPITAL_COMMUNITY)
Admission: RE | Admit: 2018-03-31 | Discharge: 2018-03-31 | Disposition: A | Discharge status: Home / Self Care | Payer: Medicare Other | Source: Ambulatory Visit | Attending: Internal Medicine | Admitting: Internal Medicine

## 2018-03-31 DIAGNOSIS — R633 Feeding difficulties, unspecified: Secondary | ICD-10-CM

## 2018-04-04 ENCOUNTER — Encounter: Payer: Self-pay | Admitting: Adult Health

## 2018-04-04 ENCOUNTER — Ambulatory Visit: Payer: Medicare Other | Admitting: Adult Health

## 2018-04-04 VITALS — BP 123/66 | HR 67 | Ht 69.0 in

## 2018-04-04 DIAGNOSIS — E785 Hyperlipidemia, unspecified: Secondary | ICD-10-CM | POA: Diagnosis not present

## 2018-04-04 DIAGNOSIS — I10 Essential (primary) hypertension: Secondary | ICD-10-CM

## 2018-04-04 DIAGNOSIS — E1169 Type 2 diabetes mellitus with other specified complication: Secondary | ICD-10-CM | POA: Diagnosis not present

## 2018-04-04 DIAGNOSIS — I615 Nontraumatic intracerebral hemorrhage, intraventricular: Secondary | ICD-10-CM | POA: Diagnosis not present

## 2018-04-04 NOTE — Progress Notes (Signed)
[facility] [address] [place]. [place] [address] [phone]       OFFICE FOLLOW UP NOTE  [name] Date of Birth:  Sep 09, 1945 Medical Record Number:  [id number]   Reason for Referral:  hospital stroke follow up  CHIEF COMPLAINT:  Chief Complaint  Patient presents with  . Follow-up    Hospital follow up. Patient is accomplanied by wife and daughter Summer. Rm 9. Patient stated that he has being working with [employer].     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 History obtained from patient, wife and daughter and chart review. Reviewed all radiology images and labs personally.  [name] a 72 y.o.malewith history ofhypertension, hyperlipidemia, nasopharyngeal cancer status post radiation, diabetes mellitus, chronic kidney disease, anxiety and claustrophobiawho presented with right-sided flaccidity visual deficits, and bradycardia. CT head reviewed and showed large hemorrhagic infarct measuring 7.8 x 4.4 x 5.2 cm along with 4 mm rightward midline shift.  MRI head was not performed due to bradycardia.  CTA head and neck was negative for identified source of bleeding.  Repeat CT stable hematoma.  Additional repeat CT was negative for significant change other than interval development of small volume intraventricular hemorrhage.  2D echo showed an EF of 65 to 70% without cardiac source of embolus identified.  LDL 111 and his patient was on fenofibrate and fish oil prior to admission, is recommended to add Lipitor 20 mg at discharge.  A1c 6.2 and recommended continue follow up with PCP for DM management. Hypertensive emergency with BP upon arrival 194/95 and recommended additional norvasc 10mg  and lisinopril 20mg  at discharge. Patient was discharged to CIR for continued therapies for deficits of global aphasia, right hemiplegia and right neglect.  Patient was transferred to  inpatient rehab on 02/02/2018.  Per notes, he sustained a fall the next day while trying to ambulate out of bed independently and since that time, it was felt as though patient seemed weaker.  patient did undergo gastrostomy tube on 02/05/2018.  During inpatient rehab, patient was found to have aspiration pneumonia on 02/18/2018 and was started on cefepime along with slow progressive gains in therapy, and multiple episodes of hypotension.  Due to ongoing medical conditions, patient was transferred back to acute inpatient stay for management.  Patient was treated for aspiration pneumonia along with other chronic conditions and recommended discharge to SNF.  Was also discussed with wife to consider palliative care consult to discuss goals of care along with discussion of poor prognosis and family were in agreement to this and palliative care will continue to follow during SNF.  Patient did return to ED on 03/06/2018 due to staff finding him with increased dyspnea.  Per notes, patient became hypoxic while transport and EMS to hospital and after suctioning this improved.  High suspicion for mucous plugging as all imaging and labs came back stable and therefore was discharged back to facility with instructions of suctioning every 4 hours as needed.  Patient is being seen today for hospital follow-up and is accompanied by his wife and daughter.  He continues to reside at [facility] in rehabilitation for continued therapies as he has continued deficits of left hemiplegia and moderate to severe dysarthria/expressive aphasia.  Wife and daughter both have seen improvement in his speech abilities.  He currently sitting in wheelchair but is able to pivot on his left leg.  He did undergo swallow test today  and per wife, it was recommended to continue n.p.o. status with use of PEG tube.  Patient was supposed to be discharged home on 9/11 and 9/19 but wife appealed these discharges with insurance company due to continued need  of 24-hour care and therapies.  Blood pressure today satisfactory at 123/66.  She continues to take fenofibrate for HLD management.  He was not started on Lipitor at discharge due to intolerances in the past.  Denies new or worsening stroke/TIA symptoms.    ROS:   14 system review of systems performed and negative with exception of trouble swallowing, moles, shortness of breath, cough, memory loss, slurred speech, difficulty swallowing, joint pain, incontinence and aching muscles  PMH:  Past Medical History:  Diagnosis Date  . Anxiety    Claustrophobia  . Arthritis   . Cancer (HCC)    nasopharynx  . Chronic cough    EXCESS MUCUS BUILDUP SINCE ORAL SURGERY FROM CANCER   . Chronic kidney disease    Renal Insuffiency , Creatinine has been in the 2- 3 range, 11/27/14 Creatine 1.4  . Diabetes mellitus without complication ([place])    Type II  . Gout   . History of radiation therapy 12/23/2014- 02/10/2015   Nasopharynx / Neck Adenopathy  . Hyperlipidemia   . Hypertension   . Inguinal hernia   . Right inguinal hernia 08/23/2017    PSH:  Past Surgical History:  Procedure Laterality Date  . colonoscopy    . COLONOSCOPY W/ POLYPECTOMY    . INGUINAL HERNIA REPAIR Right 08/23/2017   Procedure: OPEN REPAIR RIGHT INGUINAL HERNIA WITH MESH;  Surgeon: Fanny Skates, MD;  Location: [facility];  Service: General;  Laterality: Right;  GENERAL AND TAP BLOCK  . INSERTION OF MESH Right 08/23/2017   Procedure: INSERTION OF MESH;  Surgeon: Fanny Skates, MD;  Location: [facility];  Service: General;  Laterality: Right;  GENERAL AND TAP BLOCK  . IR GASTROSTOMY TUBE MOD SED  02/06/2018  . KNEE SURGERY Left 1990's   tibia crushed, pinned and bone grafted  . LYMPH NODE BIOPSY    . MULTIPLE EXTRACTIONS WITH ALVEOLOPLASTY N/A 12/06/2014   Procedure: Extraction of tooth #'s 2,4,5,6,7,8,9,10,11,12,13,14,15, 20,22,26,27,28,29 with alveoloplasty.;  Surgeon: Lenn Cal, DDS;  Location: [facility];  Service: Oral  Surgery;  Laterality: N/A;    Social History:  Social History   Socioeconomic History  . Marital status: Married    Spouse name: Not on file  . Number of children: 1  . Years of education: Not on file  . Highest education level: Not on file  Occupational History  . Not on file  Social Needs  . Financial resource strain: Not on file  . Food insecurity:    Worry: Patient refused    Inability: Patient refused  . Transportation needs:    Medical: Patient refused    Non-medical: Patient refused  Tobacco Use  . Smoking status: Former Smoker    Packs/day: 1.00    Years: 25.00    Pack years: 25.00    Last attempt to quit: 07/12/1998    Years since quitting: 19.7  . Smokeless tobacco: Never Used  Substance and Sexual Activity  . Alcohol use: No  . Drug use: No  . Sexual activity: Not Currently  Lifestyle  . Physical activity:    Days per week: Patient refused    Minutes per session: Patient refused  . Stress: Not on file  Relationships  . Social connections:    Talks on phone:  Patient refused    Gets together: Patient refused    Attends religious service: Patient refused    Active member of club or organization: Patient refused    Attends meetings of clubs or organizations: Patient refused    Relationship status: Patient refused  . Intimate partner violence:    Fear of current or ex partner: Patient refused    Emotionally abused: Patient refused    Physically abused: Patient refused    Forced sexual activity: Patient refused  Other Topics Concern  . Not on file  Social History Narrative   Patient is married and had 1 child. 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    Family History:  Family History  Problem Relation Age of Onset  . Cancer Mother        throat ca  . Cancer Father        lung ca  . Cancer Brother        throat ca  . Heart block Brother     Medications:   Current Outpatient  Medications on File Prior to Visit  Medication Sig Dispense Refill  . acetaminophen (TYLENOL) 500 MG tablet Place 1,000 mg into feeding tube 3 (three) times daily as needed for moderate pain.    Marland Kitchen amLODipine (NORVASC) 10 MG tablet Take 1 tablet (10 mg total) by mouth daily. (Patient taking differently: Place 10 mg into feeding tube daily. )    . artificial tears (LACRILUBE) OINT ophthalmic ointment Place into both eyes every 3 (three) hours as needed for dry eyes.    . chlorhexidine (PERIDEX) 0.12 % solution 15 mLs by Mouth Rinse route 2 (two) times daily. 120 mL 0  . fenofibrate 160 MG tablet Place 160 mg into feeding tube daily.     Marland Kitchen glycopyrrolate (ROBINUL) 1 MG tablet Place 1 mg into feeding tube 2 (two) times daily.    Marland Kitchen lisinopril (PRINIVIL,ZESTRIL) 20 MG tablet Take 1 tablet (20 mg total) by mouth daily.    . metFORMIN (GLUCOPHAGE) 500 MG tablet Place 500 mg into feeding tube daily with breakfast.    . methocarbamol (ROBAXIN) 500 MG tablet Place 500 mg into feeding tube every 6 (six) hours as needed for muscle spasms.    . methylphenidate (RITALIN) 5 MG tablet Place 5 mg into feeding tube 2 (two) times daily.    . metoprolol tartrate (LOPRESSOR) 25 MG tablet Place 25 mg into feeding tube 2 (two) times daily.    . Multiple Vitamin (MULTIVITAMIN) LIQD Place 15 mLs into feeding tube daily.    . Nutritional Supplements (FEEDING SUPPLEMENT, GLUCERNA 1.2 CAL,) LIQD Place into feeding tube every 4 (four) hours. 1 can    . nystatin (MYCOSTATIN) 100000 UNIT/ML suspension Use as directed 5 mLs (500,000 Units total) in the mouth or throat 4 (four) times daily. 60 mL 0  . polyethylene glycol (MIRALAX / GLYCOLAX) packet Take 17 g by mouth daily. 14 each 0  . senna-docusate (SENOKOT-S) 8.6-50 MG tablet Take 1 tablet by mouth 2 (two) times daily.    Marland Kitchen tiZANidine (ZANAFLEX) 4 MG tablet Place 4 mg into feeding tube at bedtime.   4  . glycopyrrolate (ROBINUL) 0.2 MG/ML injection Inject 1 mL (0.2 mg total)  into the vein every 12 (twelve) hours. 1 mL 0  . methylphenidate (RITALIN) 5 MG tablet Place 1 tablet (5 mg total) into feeding tube 2 (two) times daily with breakfast and lunch. [place]  tablet 0  . metoprolol tartrate (LOPRESSOR) 25 MG tablet Place 1 tablet (25 mg total) into feeding tube 2 (two) times daily. 60 tablet 0   No current facility-administered medications on file prior to visit.     Allergies:   Allergies  Allergen Reactions  . Cefepime     Urticarial rash - unclear cause, had been receiving cefepime for several days and had also had flagyl as well  . Flagyl [Metronidazole]     Urticarial rash - unclear, was also receiving cefepime      Physical Exam  Vitals:   04/04/18 1422  BP: 123/66  Pulse: 67  Height: 5\' 9"  (1.753 m)   Body mass index is 18.67 kg/m. No exam data present  General: Frail pleasant elderly Caucasian male, seated, in no evident distress Head: head normocephalic and atraumatic.   Neck: supple with no carotid or supraclavicular bruits Cardiovascular: regular rate and rhythm, no murmurs Musculoskeletal: no deformity Skin:  no rash/petichiae Vascular:  Normal pulses all extremities  Neurologic Exam Mental Status: Awake and fully alert.  Moderate to severe expressive aphasia/dysarthria.  Oriented to place and time. Recent and remote memory intact. Attention span, concentration and fund of knowledge appropriate. Mood and affect appropriate.  Cranial Nerves: Fundoscopic exam reveals sharp disc margins. Pupils equal, briskly reactive to light. Extraocular movements full without nystagmus. Visual fields full to confrontation. Hearing intact. Facial sensation intact. Face, tongue, palate moves normally and symmetrically.  Motor: Normal bulk and tone.  RUE and RLE: 0/5; full strength in left upper and lower extremity Sensory.:  Decreased sensation right upper and lower extremity Coordination: Rapid alternating movements normal on left side finger-to-nose and  heel-to-shin performed accurately on left side. Gait and Station: Patient is currently sitting in wheelchair as he does not ambulate except for the and pivots with physical therapy.  Gait assessment was deferred at this time. Reflexes: 1+ and symmetric. Toes downgoing.    NIHSS  13 Modified Rankin  4     Diagnostic Data (Labs, Imaging, Testing)  Ct Head Code Stroke Wo Contrast IMPRESSION:  1. 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  2. Screening vasogenic edema and mass effect with 4 mm of midline shift and effacement of the left lateral ventricle.  3. Mild generalized white matter disease likely reflects the sequela of chronic microvascular ischemia   Ct Angio Head and neck W Or Wo Contrast 01/26/2018 1. No acute or focal vascular lesion to explain the patient's hemorrhage.  2. No definite enhancement or mass lesion.  3. Atherosclerotic changes involving the aortic arch and bilateral carotid bifurcations without a significant stenosis relative to the more distal vessels.  4. Cavernous atherosclerotic changes are much more mild, also without a significant stenosis.  5. Left MCA branch vessels are displaced by the hemorrhagic mass.   Ct Head Wo Contrast 01/26/2018 Unchanged size of large intraparenchymal hemorrhage centered in the left frontal white matter.  Unchanged 4 mm rightward midline shift.   CT Head Wo Contrast 01/28/2018 1.No significant interval change in size and distribution of large intraparenchymal hemorrhage centered at the left frontal white matter. Similar localized regional mass effect with persistent 4 mm left-to-right shift. 2. Interval development of small volume intraventricular hemorrhage. No hydrocephalus or ventricular trapping. 3. No other new acute intracranial abnormality.  Transthoracic Echocardiogram  01/27/18 - Left ventricle: The cavity size was normal. Systolic function wasvigorous. The estimated ejection  fraction was in the range of 65%to  70%. Wall motion was normal; there were no regional wallmotion abnormalities. Left ventricular diastolic functionparameters were normal. - Aortic valve: Trileaflet; moderately thickened, moderatelycalcified leaflets. Transvalvular velocity was within the normalrange. There was no stenosis. There was no regurgitation. - Mitral valve: There was no regurgitation. - Right ventricle: The cavity size was normal. Wall thickness wasnormal. Systolic function was normal. - Tricuspid valve: There was no regurgitation. - Pulmonary arteries: Systolic pressure could not be accuratelyestimated. Impressions:   Limited study quality, hyperdynamic LVEF, aortic valve leafletsare calcified, no stenosis, no source of embolism was seen.    ASSESSMENT: Adam Yang is a 72 y.o. 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 Vascular risk factors include HTN, HLD, and DM.  Patient is being seen today for hospital follow-up and continues to have right hemiplegia and moderate to severe expressive aphasia/dysarthria.    PLAN: -Continue fenofibrate for secondary stroke prevention -No antiplatelet at this time due to recent [place] -F/u with PCP regarding your HLD, DM and HTN management -Advised to continue PT/OT/ST at current facility for continued deficits -No indication to repeat CT head as prior imagings showed decrease in prior hemorrhage as well as patient continuing to improve and deficits and denies worsening deficits or headache -Continue need of SNF placement due to need of 24-hour care and continuous therapy for ongoing deficits -continue to monitor BP at home -Maintain strict control of hypertension with blood pressure goal below 130/90, diabetes with hemoglobin A1c goal below 6.5% and cholesterol with LDL cholesterol (bad cholesterol) goal below 70  mg/dL. I also advised the patient to eat a healthy diet with plenty of whole grains, cereals, fruits and vegetables, exercise regularly and maintain ideal body weight.  Follow up in 3 months or call earlier if needed   Greater than 50% of time during this 25 minute visit was spent on counseling,explanation of diagnosis of left hemispheric large ICH with IVH, reviewing risk factor management of HTN, HLD and DM, planning of further management, discussion with patient and family and coordination of care    Venancio Poisson, AGNP-BC  [facility] [address] [place], [place] [address]  Phone [phone] Fax [phone] Note: This document was prepared with digital dictation and possible smart phrase technology. Any transcriptional errors that result from this process are unintentional.

## 2018-04-04 NOTE — Patient Instructions (Signed)
Continue fenofibrate  for secondary stroke prevention  Continue to follow up with PCP regarding cholesterol and blood pressure management   Continue therapies for continued deficits   Continue to monitor blood pressure at facility  Maintain strict control of hypertension with blood pressure goal below 130/90, diabetes with hemoglobin A1c goal below 6.5% and cholesterol with LDL cholesterol (bad cholesterol) goal below 70 mg/dL. I also advised the patient to eat a healthy diet with plenty of whole grains, cereals, fruits and vegetables, exercise regularly and maintain ideal body weight.  Followup in the future with me in 3 months or call earlier if needed       Thank you for coming to see [place] at [facility]. I hope we have been able to provide you high quality care today.  You may receive a patient satisfaction survey over the next few weeks. We would appreciate your feedback and comments so that we may continue to improve ourselves and the health of our patients.

## 2018-04-05 ENCOUNTER — Encounter: Payer: Self-pay | Admitting: Internal Medicine

## 2018-04-05 NOTE — Assessment & Plan Note (Signed)
A1c 6.o: Continue metformin 500 mg every morning: Patient is on ACE, not on statin due to intolerance

## 2018-04-05 NOTE — Assessment & Plan Note (Signed)
Controlled on multiple meds; continue Norvasc 10 mg daily lisinopril 20 mg daily metoprolol 25 mg twice daily

## 2018-04-05 NOTE — Assessment & Plan Note (Signed)
Patient is learning to use his left side to compensate, right side has not improved still flaccid 0/5 strength `

## 2018-04-07 NOTE — Progress Notes (Signed)
I agree with the above plan 

## 2018-04-10 ENCOUNTER — Encounter (HOSPITAL_COMMUNITY): Payer: Self-pay | Admitting: Interventional Radiology

## 2018-04-10 ENCOUNTER — Ambulatory Visit (HOSPITAL_COMMUNITY)
Admission: RE | Admit: 2018-04-10 | Discharge: 2018-04-10 | Disposition: A | Discharge status: Home / Self Care | Payer: Medicare Other | Source: Ambulatory Visit | Attending: Interventional Radiology | Admitting: Interventional Radiology

## 2018-04-10 DIAGNOSIS — Z431 Encounter for attention to gastrostomy: Secondary | ICD-10-CM | POA: Insufficient documentation

## 2018-04-10 DIAGNOSIS — R633 Feeding difficulties, unspecified: Secondary | ICD-10-CM

## 2018-04-10 HISTORY — PX: IR REPLC GASTRO/COLONIC TUBE PERCUT W/FLUORO: IMG2333

## 2018-04-10 MED ORDER — IOPAMIDOL (ISOVUE-300) INJECTION 61%
INTRAVENOUS | Status: AC
  Administered 2018-04-10: 10 mL
  Filled 2018-04-10: qty 50

## 2018-04-10 MED ORDER — IOPAMIDOL (ISOVUE-300) INJECTION 61%
50.0000 mL | Freq: Once | INTRAVENOUS | Status: AC | PRN
  Administered 2018-04-10: 10 mL

## 2018-04-10 MED ORDER — LIDOCAINE VISCOUS HCL 2 % MT SOLN
OROMUCOSAL | Status: AC
  Filled 2018-04-10: qty 15

## 2018-04-12 ENCOUNTER — Encounter: Payer: Self-pay | Admitting: Internal Medicine

## 2018-04-12 ENCOUNTER — Non-Acute Institutional Stay (SKILLED_NURSING_FACILITY): Payer: Medicare Other | Admitting: Internal Medicine

## 2018-04-12 DIAGNOSIS — I952 Hypotension due to drugs: Secondary | ICD-10-CM | POA: Diagnosis not present

## 2018-04-12 NOTE — Progress Notes (Signed)
Location:  [facility] Room Number: 814G Place of Service:  SNF (31)  Adam Yang. [name], MD  Patient Care Team: Lawerance Cruel, MD as PCP - General (Family Medicine) Heath Lark, MD as Consulting Physician (Hematology and Oncology) Arloa Koh, MD as Consulting Physician (Radiation Oncology) Karie Mainland, RD as Dietitian (Nutrition)  Extended Emergency Contact Information Primary Emergency Contact: Cangelosi,Cathy Address: [place], [place] [address] Johnnette [place] of [place] Phone: [phone] Mobile Phone: [phone] Relation: Spouse Secondary Emergency Contact: Lietz, Summer Mobile Phone: [phone] Relation: Daughter    Allergies: Cefepime and Flagyl [metronidazole]  Chief Complaint  Patient presents with  . Acute Visit    Blood pressure    HPI: Patient is 72 y.o. male who being seen acutely for blood pressure fall to systolic blood pressure high 80s.  Patient denies any chest pain shortness of breath breath, some mild dizziness.  No nausea vomiting diarrhea,, no fevers coughs colds or any signs or symptoms of bleeding..  Patient has a G-tube.  Past Medical History:  Diagnosis Date  . Anxiety    Claustrophobia  . Arthritis   . Cancer (HCC)    nasopharynx  . Chronic cough    EXCESS MUCUS BUILDUP SINCE ORAL SURGERY FROM CANCER   . Chronic kidney disease    Renal Insuffiency , Creatinine has been in the 2- 3 range, 11/27/14 Creatine 1.4  . Diabetes mellitus without complication ([place])    Type II  . Gout   . History of radiation therapy 12/23/2014- 02/10/2015   Nasopharynx / Neck Adenopathy  . Hyperlipidemia   . Hypertension   . Inguinal hernia   . Right inguinal hernia 08/23/2017    Past Surgical History:  Procedure Laterality Date  . colonoscopy    . COLONOSCOPY W/ POLYPECTOMY    . INGUINAL HERNIA REPAIR Right 08/23/2017   Procedure: OPEN REPAIR RIGHT INGUINAL HERNIA WITH MESH;  Surgeon:  Fanny Skates, MD;  Location: [facility];  Service: General;  Laterality: Right;  GENERAL AND TAP BLOCK  . INSERTION OF MESH Right 08/23/2017   Procedure: INSERTION OF MESH;  Surgeon: Fanny Skates, MD;  Location: [facility];  Service: General;  Laterality: Right;  GENERAL AND TAP BLOCK  . IR GASTROSTOMY TUBE MOD SED  02/06/2018  . IR [place] TUBE PERCUT W/FLUORO  04/10/2018  . KNEE SURGERY Left 1990's   tibia crushed, pinned and bone grafted  . LYMPH NODE BIOPSY    . MULTIPLE EXTRACTIONS WITH ALVEOLOPLASTY N/A 12/06/2014   Procedure: Extraction of tooth #'s 2,4,5,6,7,8,9,10,11,12,13,14,15, 20,22,26,27,28,29 with alveoloplasty.;  Surgeon: Lenn Cal, DDS;  Location: [facility];  Service: Oral Surgery;  Laterality: N/A;    Allergies as of 04/12/2018      Reactions   Cefepime    Urticarial rash - unclear cause, had been receiving cefepime for several days and had also had flagyl as well   Flagyl [metronidazole]    Urticarial rash - unclear, was also receiving cefepime      Medication List        Accurate as of 04/12/18  1:34 PM. Always use your most recent med list.          amLODipine 10 MG tablet Commonly known as:  NORVASC Take 1 tablet (10 mg total) by mouth daily.   artificial tears Oint ophthalmic ointment Commonly known as:  LACRILUBE Place into both eyes every 3 (three) hours  as needed for dry eyes.   chlorhexidine 0.12 % solution Commonly known as:  PERIDEX 15 mLs by Mouth Rinse route 2 (two) times daily.   docusate 50 MG/5ML liquid Commonly known as:  COLACE Take by mouth daily. Give 10 mg ( 10 ml ) via peg tube daily for constipation   feeding supplement (GLUCERNA 1.2 CAL) Liqd Place into feeding tube every 4 (four) hours. 1 can   fenofibrate 160 MG tablet Place 160 mg into feeding tube daily.   glycopyrrolate 1 MG tablet Commonly known as:  ROBINUL Place 1 mg into feeding tube 2 (two) times daily.   lisinopril 20 MG tablet Commonly known as:   PRINIVIL,ZESTRIL Take 1 tablet (20 mg total) by mouth daily.   metFORMIN 500 MG tablet Commonly known as:  GLUCOPHAGE Place 500 mg into feeding tube daily with breakfast.   methocarbamol 500 MG tablet Commonly known as:  ROBAXIN Place 500 mg into feeding tube every 6 (six) hours as needed for muscle spasms.   methylphenidate 5 MG tablet Commonly known as:  RITALIN Place 5 mg into feeding tube 2 (two) times daily.   metoprolol tartrate 25 MG tablet Commonly known as:  LOPRESSOR Place 25 mg into feeding tube 2 (two) times daily.   multivitamin Liqd Place 15 mLs into feeding tube daily.   nystatin 100000 UNIT/ML suspension Commonly known as:  MYCOSTATIN Use as directed 5 mLs (500,000 Units total) in the mouth or throat 4 (four) times daily.   polyethylene glycol packet Commonly known as:  MIRALAX / GLYCOLAX Take 17 g by mouth daily.   tiZANidine 4 MG tablet Commonly known as:  ZANAFLEX Place 4 mg into feeding tube at bedtime.   TYLENOL 500 MG tablet Generic drug:  acetaminophen Place 1,000 mg into feeding tube 3 (three) times daily as needed for moderate pain.       No orders of the defined types were placed in this encounter.    There is no immunization history on file for this patient.  Social History   Tobacco Use  . Smoking status: Former Smoker    Packs/day: 1.00    Years: 25.00    Pack years: 25.00    Last attempt to quit: 07/12/1998    Years since quitting: 19.7  . Smokeless tobacco: Never Used  Substance Use Topics  . Alcohol use: No    Review of Systems  DATA OBTAINED: from patient, nurse GENERAL:  no fevers, fatigue, appetite changes SKIN: No itching, rash HEENT: No complaint RESPIRATORY: No cough, wheezing, SOB CARDIAC: No chest pain, palpitations, lower extremity edema  GI: No abdominal pain, No N/V/D or constipation, No heartburn or reflux  GU: No dysuria, frequency or urgency, or incontinence  MUSCULOSKELETAL: No unrelieved bone/joint  pain NEUROLOGIC: No headache, mild dizziness  PSYCHIATRIC: No overt anxiety or sadness  Vitals:   04/12/18 1021  BP: (!) 92/59  Pulse: 61  Resp: 18  Temp: (!) 97 F (36.1 C)   Body mass index is 18.22 kg/m. Physical Exam  GENERAL APPEARANCE: Alert, conversant, No acute distress  SKIN: No diaphoresis rash HEENT: Unremarkable RESPIRATORY: Breathing is even, unlabored. Lung sounds are clear   CARDIOVASCULAR: Heart RRR no murmurs, rubs or gallops. No peripheral edema  GASTROINTESTINAL: Abdomen is soft, non-tender, not distended w/ normal bowel sounds.  B: G-tube GENITOURINARY: Bladder non tender, not distended  MUSCULOSKELETAL: No abnormal joints or musculature NEUROLOGIC: Cranial nerves 2-12 grossly intact. Moves all extremities PSYCHIATRIC: Mood and affect appropriate to situation,  no behavioral issues  Patient Active Problem List   Diagnosis Date Noted  . 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 Emphysema of lung ([place]) 03/22/2018  . CVA (cerebrovascular accident due to intracerebral hemorrhage) ([place]) 03/05/2018  . Acute respiratory failure with hypoxia ([place]) 03/05/2018  . Hypotension 03/05/2018  . Hyperlipidemia associated with type 2 diabetes mellitus ([place]) 03/05/2018  . Copious oral secretions   . Goals of care, counseling/discussion   . Palliative care by specialist   . Malnutrition of moderate degree 02/24/2018  . Pneumonia of both lungs due to infectious organism 02/23/2018  . Pneumonia due to infectious organism 02/23/2018  . Aspiration pneumonia ([place]) 02/22/2018  . Aspiration pneumonia of right lower lobe due to milk ([place])   . Dysphasia   . Anemia of chronic disease   . Hypernatremia   . Transaminitis   . Hypertensive emergency 02/02/2018  . Intraparenchymal hemorrhage of brain ([place]) 02/02/2018  . Hemiparesis affecting right side as late effect of stroke ([place])   . Aphasia, post-stroke   . Dysphagia, post-stroke   .  Nontraumatic subcortical hemorrhage of left cerebral hemisphere ([place])   . Diabetes mellitus type 2 in nonobese (HCC)   . AKI (acute kidney injury) ([place])   . Hyponatremia   . Chronic kidney disease   . Cytotoxic brain edema ([place]) 01/28/2018  . Brain herniation ([place]) 01/28/2018  . IVH (intraventricular hemorrhage) ([place]) 01/27/2018  . Right inguinal hernia 08/23/2017  . Chronic kidney disease, stage III (moderate) ([place]) 02/18/2017  . 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 Other fatigue 06/18/2016  . Essential hypertension 10/17/2015  . Constipation 02/06/2015  . Drug-induced skin rash 02/03/2015  . Mucositis due to chemotherapy 01/24/2015  . Hyperglycemia due to type 2 diabetes mellitus ([place]) 01/07/2015  . Chronic anxiety 12/19/2014  . Stage 2 chronic renal impairment associated with type 2 diabetes mellitus ([place]) 12/19/2014  . Type 2 diabetes mellitus, controlled, with renal complications ([place]) 74/25/9563  . History of nasopharyngeal cancer 11/26/2014    CMP     Component Value Date/Time   NA 147 (H) 03/06/2018 1130   NA 144 03/06/2018   NA 139 02/18/2017 1502   K 3.7 03/06/2018 1130   K 4.3 02/18/2017 1502   CL 102 03/06/2018 1130   CO2 31 03/06/2018 1130   CO2 27 02/18/2017 1502   GLUCOSE 149 (H) 03/06/2018 1130   GLUCOSE 108 02/18/2017 1502   BUN 59 (H) 03/06/2018 1130   BUN 56 (A) 03/06/2018   BUN 30.9 (H) 02/18/2017 1502   CREATININE 1.52 (H) 03/06/2018 1130   CREATININE 1.6 (H) 02/18/2017 1502   CALCIUM 10.0 03/06/2018 1130   CALCIUM 9.6 02/18/2017 1502   PROT 7.6 03/06/2018 1130   PROT 7.0 02/18/2017 1502   ALBUMIN 3.3 (L) 03/06/2018 1130   ALBUMIN 3.0 (L) 02/18/2017 1502   AST 26 03/06/2018 1130   AST 16 02/18/2017 1502   ALT 29 03/06/2018 1130   ALT 12 02/18/2017 1502   ALKPHOS 54 03/06/2018 1130   ALKPHOS 58 02/18/2017 1502   BILITOT 0.5 03/06/2018 1130   BILITOT 0.25 02/18/2017 1502   GFRNONAA 45 (L)  03/06/2018 1130   GFRAA 52 (L) 03/06/2018 1130   Recent Labs    01/27/18 0459 01/27/18 1739  02/27/18 0819 02/28/18 1001 03/01/18 0518 03/06/18 03/06/18 1130  NA 139  --    < > 138 139 141 144 147*  K 4.1  --    < >  4.2 4.4 5.0 4.1 3.7  CL 104  --    < > 99 101 101  --  102  CO2 22  --    < > 30 26 31   --  31  GLUCOSE 155*  --    < > 211* 206* 248*  --  149*  BUN 40*  --    < > 54* 57* 61* 56* 59*  CREATININE 2.64*  --    < > 1.66* 1.53* 1.65* 1.4* 1.52*  CALCIUM 9.7  --    < > 9.0 9.3 9.5  --  10.0  MG 2.4 2.9*   < > 2.2 2.2 2.4  --   --   PHOS 4.7* 5.0*  --   --   --   --   --   --    < > = values in this interval not displayed.   Recent Labs    02/25/18 0547 02/27/18 0819 03/06/18 1130  AST 35 53* 26  ALT 38 43 29  ALKPHOS 66 56 54  BILITOT 0.3 0.3 0.5  PROT 6.6 6.3* 7.6  ALBUMIN 2.0* 2.2* 3.3*   Recent Labs    01/26/18 0913  02/03/18 0551  02/28/18 1001 03/01/18 0518 03/06/18 03/06/18 1130  WBC 5.8   < > 8.8   < > 6.4 8.6 10.5 15.0*  NEUTROABS 2.8  --  6.3  --   --   --   --  13.4*  HGB 12.4*   < > 12.6*   < > 9.6* 9.6* 11.4* 12.1*  HCT 39.6   < > 42.5   < > 30.6* 31.6* 36* 39.1  MCV 82.2   < > 87.1   < > 79.7 82.3  --  82.3  PLT 324   < > 276   < > 496* 478* 581* 665*   < > = values in this interval not displayed.   Recent Labs    01/27/18 0459 01/30/18 0251 02/02/18 0436  CHOL 231*  --   --   LDLCALC 111*  --   --   TRIG 357*  348* 142 133   No results found for: [facility] Lab Results  Component Value Date   TSH 2.745 02/18/2017   Lab Results  Component Value Date   HGBA1C 6.0 (H) 01/27/2018   Lab Results  Component Value Date   CHOL 231 (H) 01/27/2018   HDL 49 01/27/2018   LDLCALC 111 (H) 01/27/2018   TRIG 133 02/02/2018   CHOLHDL 4.7 01/27/2018    Significant Diagnostic Results in last 30 days:  Ir Replc Gastro/colonic Tube Percut W/fluoro  Result Date: 04/10/2018 INDICATION: Stroke, with subsequent need for enteral feeding  support. Gastrostomy catheter placed 02/06/2018, has worked well, but exchange to a more convenient gastric button is requested. EXAM: GASTROSTOMY CATHETER REPLACEMENT MEDICATIONS: None indicated ANESTHESIA/SEDATION: Viscous lidocaine topically CONTRAST:  6mL ISOVUE-300 IOPAMIDOL (ISOVUE-300) INJECTION 61%, 56mL ISOVUE-300 IOPAMIDOL (ISOVUE-300) INJECTION 61% - administered into the gastric lumen. PROCEDURE: Informed written consent was obtained from the family after a thorough discussion of the procedural risks, benefits and alternatives. All questions were addressed. Maximal Sterile Barrier Technique was utilized including caps, mask, sterile gowns, sterile gloves, sterile drape, hand hygiene and skin antiseptic. A timeout was performed prior to the initiation of the procedure. The length of the gastrostomy tract had been previously determined. The indwelling gastrostomy catheter and surrounding skin were prepped with Betadine, draped in usual sterile fashion, and liberal viscous lidocaine applied topically  the catheter was easily removed intact with manual traction. A 20 French 2.3 cm MicKey gastrostomy device was then passed through the tract, and the balloon dilated with 10 mL sterile saline. Contrast injection confirmed good positioning of the balloon within the lumen of the decompressed stomach. The catheter was flushed and the access device removed. The patient tolerated the procedure well. FLUOROSCOPY TIME:  0.1 minute; 22 uGym2 DAP COMPLICATIONS: None immediate. IMPRESSION: 1. Technically successful conversion of 20 French gastrostomy catheter to a 20 French 2.3 cm MicKey gastrostomy button device, ready for routine use. Electronically Signed   By: Lucrezia Europe M.D.   On: 04/10/2018 16:16    Assessment and Plan  Hypertension-mild; patient is given a 335 cc bolus per tube with improvement of blood pressure; will decrease patient's Lopressor to 25 mg twice daily; continue to monitor response    Adam Silversmith  D. [name], MD

## 2018-04-13 NOTE — Addendum Note (Signed)
Addended by: Inocencio Homes D on: 04/13/2018 05:42 PM   Modules accepted: Orders

## 2018-04-18 ENCOUNTER — Telehealth: Payer: Self-pay | Admitting: Adult Health

## 2018-04-18 NOTE — Telephone Encounter (Signed)
[name] called and asking for Janett Billow to do a peer to Peer to insurance company for her husband to be able to stay in Rehab .  I relayed to [name] Janett Billow could not do a Peer to Peer -- Dr. [name] would have to at [facility]. Relayed if Patient needed any orders Dr. [name] would have to extend those.   Janett Billow NP would see Patient is the office for follow. [name] understood all details.

## 2018-04-18 NOTE — Telephone Encounter (Signed)
Patient is doing better that's why insurance is fighting this .

## 2018-04-18 NOTE — Telephone Encounter (Signed)
Pt's wife Cathy/DPR said the appeal to the insurance company has been denied for him to stay at [facility]. His discharge date is this Friday 04/21/18. She said at the [facility] said if there was any problem with the insurance company about him staying at rehab to call and she would help with this. She is concerned about BP as it has been unstable at times and concerned about if he comes home how will she be able to take care of him. Please call to advise, she would like to speak with Janett Billow if possible.

## 2018-04-18 NOTE — Telephone Encounter (Signed)
Jessica Please dont place any orders Laurell Josephs is assiting patient with this matter . [name] is going to call me back. Thanks Hinton Dyer.

## 2018-04-18 NOTE — Telephone Encounter (Signed)
This all depends on the insurance . Its not up to use . Patient's insurance is not picking up . Adam Yang will you extend orders For PT , OT and Speech  And Nursing . I had along conversation with [name] . Patient's insurance is not approving. I have called [facility] Telephone [phone] - fax [phone]  . They have done everything they can to help patient . Patient may have to go home . If so We can offer Home Health at that time.

## 2018-04-18 NOTE — Telephone Encounter (Signed)
Noted. Thank you for your assistance and looking into this.

## 2018-04-19 DIAGNOSIS — I69391 Dysphagia following cerebral infarction: Secondary | ICD-10-CM | POA: Diagnosis not present

## 2018-04-19 DIAGNOSIS — J181 Lobar pneumonia, unspecified organism: Secondary | ICD-10-CM | POA: Diagnosis not present

## 2018-04-19 DIAGNOSIS — M6281 Muscle weakness (generalized): Secondary | ICD-10-CM | POA: Diagnosis not present

## 2018-04-19 DIAGNOSIS — E440 Moderate protein-calorie malnutrition: Secondary | ICD-10-CM | POA: Diagnosis not present

## 2018-04-19 DIAGNOSIS — E119 Type 2 diabetes mellitus without complications: Secondary | ICD-10-CM | POA: Diagnosis not present

## 2018-04-19 DIAGNOSIS — I639 Cerebral infarction, unspecified: Secondary | ICD-10-CM | POA: Diagnosis not present

## 2018-04-19 DIAGNOSIS — I69351 Hemiplegia and hemiparesis following cerebral infarction affecting right dominant side: Secondary | ICD-10-CM | POA: Diagnosis not present

## 2018-04-20 ENCOUNTER — Non-Acute Institutional Stay (SKILLED_NURSING_FACILITY): Payer: Medicare Other | Admitting: Internal Medicine

## 2018-04-20 ENCOUNTER — Encounter: Payer: Self-pay | Admitting: Internal Medicine

## 2018-04-20 DIAGNOSIS — C113 Malignant neoplasm of anterior wall of nasopharynx: Secondary | ICD-10-CM

## 2018-04-20 DIAGNOSIS — I69391 Dysphagia following cerebral infarction: Secondary | ICD-10-CM

## 2018-04-20 DIAGNOSIS — G936 Cerebral edema: Secondary | ICD-10-CM

## 2018-04-20 DIAGNOSIS — I611 Nontraumatic intracerebral hemorrhage in hemisphere, cortical: Secondary | ICD-10-CM | POA: Diagnosis not present

## 2018-04-20 DIAGNOSIS — Z931 Gastrostomy status: Secondary | ICD-10-CM

## 2018-04-20 DIAGNOSIS — N390 Urinary tract infection, site not specified: Secondary | ICD-10-CM | POA: Diagnosis not present

## 2018-04-20 DIAGNOSIS — J189 Pneumonia, unspecified organism: Secondary | ICD-10-CM

## 2018-04-20 DIAGNOSIS — I619 Nontraumatic intracerebral hemorrhage, unspecified: Secondary | ICD-10-CM | POA: Diagnosis not present

## 2018-04-20 DIAGNOSIS — R319 Hematuria, unspecified: Secondary | ICD-10-CM | POA: Diagnosis not present

## 2018-04-20 DIAGNOSIS — G4737 Central sleep apnea in conditions classified elsewhere: Secondary | ICD-10-CM

## 2018-04-20 DIAGNOSIS — I615 Nontraumatic intracerebral hemorrhage, intraventricular: Secondary | ICD-10-CM

## 2018-04-20 DIAGNOSIS — I69320 Aphasia following cerebral infarction: Secondary | ICD-10-CM | POA: Diagnosis not present

## 2018-04-20 DIAGNOSIS — D638 Anemia in other chronic diseases classified elsewhere: Secondary | ICD-10-CM

## 2018-04-20 DIAGNOSIS — E1169 Type 2 diabetes mellitus with other specified complication: Secondary | ICD-10-CM

## 2018-04-20 DIAGNOSIS — I10 Essential (primary) hypertension: Secondary | ICD-10-CM

## 2018-04-20 DIAGNOSIS — I69398 Other sequelae of cerebral infarction: Secondary | ICD-10-CM

## 2018-04-20 DIAGNOSIS — I9589 Other hypotension: Secondary | ICD-10-CM

## 2018-04-20 DIAGNOSIS — I69351 Hemiplegia and hemiparesis following cerebral infarction affecting right dominant side: Secondary | ICD-10-CM | POA: Diagnosis not present

## 2018-04-20 DIAGNOSIS — R6889 Other general symptoms and signs: Secondary | ICD-10-CM

## 2018-04-20 DIAGNOSIS — J9601 Acute respiratory failure with hypoxia: Secondary | ICD-10-CM

## 2018-04-20 DIAGNOSIS — E785 Hyperlipidemia, unspecified: Secondary | ICD-10-CM

## 2018-04-20 DIAGNOSIS — L270 Generalized skin eruption due to drugs and medicaments taken internally: Secondary | ICD-10-CM

## 2018-04-20 DIAGNOSIS — J690 Pneumonitis due to inhalation of food and vomit: Secondary | ICD-10-CM

## 2018-04-20 NOTE — Progress Notes (Signed)
Location:  [facility] Room Number: 638L Place of Service:  SNF (31)  Adam Yang. Adam Coil, MD  Patient Care Team: Adam Cruel, MD as PCP - General (Family Medicine) Adam Lark, MD as Consulting Physician (Hematology and Oncology) Adam Koh, MD as Consulting Physician (Radiation Oncology) Adam Yang, RD as Dietitian (Nutrition)  Extended Emergency Contact Information Primary Emergency Contact: Yang,Adam Address: [place], [place] [address] Adam Yang of [place] Phone: [phone] Mobile Phone: [phone] Relation: Spouse Secondary Emergency Contact: Yang, Adam Mobile Phone: [phone] Relation: Daughter  Allergies  Allergen Reactions  . Cefepime     Urticarial rash - unclear cause, had been receiving cefepime for several days and had also had flagyl as well  . Flagyl [Metronidazole]     Urticarial rash - unclear, was also receiving cefepime     Chief Complaint  Patient presents with  . Discharge Note    Discharge from [facility]    HPI:  72 y.o. male with history of nasopharyngeal cancer status post radiation, hypertension, diabetes, hyperlipidemia, who presented on 7/18 for weakness and was found to have a left frontoparietal bleed.  Patient was admitted to [facility] from 7/18-8/15 where MRI was attempted but was unable to be performed secondary to patient continually dropping his heart rate into the 30s.  Patient was admitted to the ICU with global aphasia, right hemiplegia and right neglect.  He improved well enough to be transferred to CIR.  On 7/25 he was found to be unresponsive with a blood pressure of 76/48.  Patient had continuous problems throughout his stay with blood pressures being extremely high then extremely low and he was transferred in and out of CIR several times.  On 8/10 he was found to have bibasilar pneumonia and he was started on cefepime and Flagyl for  possible aspiration pneumonia.  Patient developed a rash which was felt to be a drug rash which did not cause any problems other than being itchy.  Patient improved on antibiotics and was able to be weaned off O2 to room air.  Patient had a PEG placed and feedings were started.  Patient was seen by palliative care who started Robinul for excessive secretions.  Patient was started on prednisone on 8/19 for his rash which was improved by 820.  Patient did not make any gains in therapy and CIR so SNF was recommended.  Patient was admitted to skilled nursing facility for OT/PT.  Patient had multiple insurance cuts and and appeals which were one, so patient had a prolonged stay at skilled nursing facility and made many gains.  He is still on feeding tube and always will be.  Patient is ready to be discharged to home.    Past Medical History:  Diagnosis Date  . Anxiety    Claustrophobia  . Arthritis   . Cancer (HCC)    nasopharynx  . Chronic cough    EXCESS MUCUS BUILDUP SINCE ORAL SURGERY FROM CANCER   . Chronic kidney disease    Renal Insuffiency , Creatinine has been in the 2- 3 range, 11/27/14 Creatine 1.4  . Diabetes mellitus without complication ([place])    Type II  . Gout   . History of radiation therapy 12/23/2014- 02/10/2015   Nasopharynx / Neck Adenopathy  . Hyperlipidemia   . Hypertension   . Inguinal hernia   . Right inguinal hernia 08/23/2017  Past Surgical History:  Procedure Laterality Date  . colonoscopy    . COLONOSCOPY W/ POLYPECTOMY    . INGUINAL HERNIA REPAIR Right 08/23/2017   Procedure: OPEN REPAIR RIGHT INGUINAL HERNIA WITH MESH;  Surgeon: Adam Skates, MD;  Location: [facility];  Service: General;  Laterality: Right;  GENERAL AND TAP BLOCK  . INSERTION OF MESH Right 08/23/2017   Procedure: INSERTION OF MESH;  Surgeon: Adam Skates, MD;  Location: [facility];  Service: General;  Laterality: Right;  GENERAL AND TAP BLOCK  . IR GASTROSTOMY TUBE MOD SED  02/06/2018  . IR [place] TUBE PERCUT W/FLUORO  04/10/2018  . KNEE SURGERY Left 1990's   tibia crushed, pinned and bone grafted  . LYMPH NODE BIOPSY    . MULTIPLE EXTRACTIONS WITH ALVEOLOPLASTY N/A 12/06/2014   Procedure: Extraction of tooth #'s 2,4,5,6,7,8,9,10,11,12,13,14,15, 20,22,26,27,28,29 with alveoloplasty.;  Surgeon: Adam Yang, DDS;  Location: [facility];  Service: Oral Surgery;  Laterality: N/A;     reports that he quit smoking about 19 years ago. He has a 25.00 pack-year smoking history. He has never used smokeless tobacco. He reports that he does not drink alcohol or use drugs. Social History   Socioeconomic History  . Marital status: Married    Spouse name: Not on file  . Number of children: 1  . Years of education: Not on file  . Highest education level: Not on file  Occupational History  . Not on file  Social Needs  . Financial resource strain: Not on file  . Food insecurity:    Worry: Patient refused    Inability: Patient refused  . Transportation needs:    Medical: Patient refused    Non-medical: Patient refused  Tobacco Use  . Smoking status: Former Smoker    Packs/day: 1.00    Years: 25.00    Pack years: 25.00    Last attempt to quit: 07/12/1998    Years since quitting: 19.7  . Smokeless tobacco: Never Used  Substance and Sexual Activity  . Alcohol use: No  . Drug use: No  . Sexual activity: Not Currently  Lifestyle  . Physical activity:    Days per week: Patient refused    Minutes per session: Patient refused  . Stress: Not on file  Relationships  . Social connections:    Talks on phone: Patient refused    Gets together: Patient refused    Attends religious service: Patient refused    Active member of club or organization: Patient refused    Attends meetings of clubs or organizations: Patient refused    Relationship status: Patient refused  . Intimate partner violence:    Fear of current or ex partner: Patient refused    Emotionally abused: Patient  refused    Physically abused: Patient refused    Forced sexual activity: Patient refused  Other Topics Concern  . Not on file  Social History Narrative   Patient is married and had 1 child. 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    Pertinent  Health Maintenance Due  Topic Date Due  . INFLUENZA VACCINE  02/09/2018  . PNA vac Low Risk Adult (1 of 2 - PCV13) 04/28/2018 (Originally 12/30/2012)  . FOOT EXAM  05/03/2018 (Originally 12/30/1955)  . OPHTHALMOLOGY EXAM  05/03/2018 (Originally 12/30/1955)  . COLONOSCOPY  05/03/2018 (Originally 12/30/1997)  . HEMOGLOBIN A1C  07/30/2018    Medications: Allergies as of 04/20/2018  Reactions   Cefepime    Urticarial rash - unclear cause, had been receiving cefepime for several days and had also had flagyl as well   Flagyl [metronidazole]    Urticarial rash - unclear, was also receiving cefepime      Medication List        Accurate as of 04/20/18 11:59 PM. Always use your most recent med list.          acetaminophen 80 MG/0.8ML suspension Commonly known as:  TYLENOL Take 1,000 mg by mouth 3 (three) times daily as needed for fever.   amLODipine 10 MG tablet Commonly known as:  NORVASC Take 1 tablet (10 mg total) by mouth daily.   artificial tears Oint ophthalmic ointment Commonly known as:  LACRILUBE Place into both eyes every 3 (three) hours as needed for dry eyes.   chlorhexidine 0.12 % solution Commonly known as:  PERIDEX 15 mLs by Mouth Rinse route 2 (two) times daily.   docusate 50 MG/5ML liquid Commonly known as:  COLACE Take by mouth daily. Give 10 mg ( 10 ml ) via peg tube daily for constipation   GLUCERNA 1.5 Yang PO Take 237 mLs by mouth every 3 (three) hours while awake. 6 am, 9a, 12p, 3 pm, 6p , 9p       Free water flush 200 cc with each meal   glycopyrrolate 1 MG tablet Commonly known as:  ROBINUL Place 1 mg into feeding tube 2 (two) times  daily.   lisinopril 20 MG tablet Commonly known as:  PRINIVIL,ZESTRIL Take 1 tablet (20 mg total) by mouth daily.   metFORMIN 500 MG tablet Commonly known as:  GLUCOPHAGE Place 500 mg into feeding tube daily with breakfast.   methylphenidate 5 MG tablet Commonly known as:  RITALIN Place 5 mg into feeding tube 2 (two) times daily.   metoprolol tartrate 25 MG tablet Commonly known as:  LOPRESSOR Place 12.5 mg into feeding tube 2 (two) times daily.   multivitamin Liqd Place 15 mLs into feeding tube daily.   nystatin 100000 UNIT/ML suspension Commonly known as:  MYCOSTATIN Use as directed 5 mLs (500,000 Units total) in the mouth or throat 4 (four) times daily.   polyethylene glycol packet Commonly known as:  MIRALAX / GLYCOLAX Take 17 g by mouth daily.   tiZANidine 4 MG tablet Commonly known as:  ZANAFLEX Place 4 mg into feeding tube at bedtime.        Vitals:   04/20/18 1429  BP: 125/72  Pulse: 77  Resp: 18  Temp: (!) 97.3 F (36.3 C)  Weight: 127 lb 6.4 oz (57.8 kg)  Height: 5\' 9"  (1.753 m)   Body mass index is 18.81 kg/m.  Physical Exam  GENERAL APPEARANCE: Alert, conversant. No acute distress.  HEENT: Unremarkable. RESPIRATORY: Breathing is even, unlabored. Lung sounds are clear   CARDIOVASCULAR: Heart RRR no murmurs, rubs or gallops. No peripheral edema.  GASTROINTESTINAL: Abdomen is soft, non-tender, not distended w/ normal bowel sounds.  NEUROLOGIC: Cranial nerves 2-12 grossly intact. Moves all extremities, hemiparesis of right side   Labs reviewed: Basic Metabolic Panel: Recent Labs    01/27/18 0459 01/27/18 1739  02/27/18 0819 02/28/18 1001 03/01/18 0518 03/06/18 03/06/18 1130  NA 139  --    < > 138 139 141 144 147*  K 4.1  --    < > 4.2 4.4 5.0 4.1 3.7  CL 104  --    < > 99 101 101  --  102  CO2 22  --    < > 30 26 31   --  31  GLUCOSE 155*  --    < > 211* 206* 248*  --  149*  BUN 40*  --    < > 54* 57* 61* 56* 59*  CREATININE 2.64*  --     < > 1.66* 1.53* 1.65* 1.4* 1.52*  CALCIUM 9.7  --    < > 9.0 9.3 9.5  --  10.0  MG 2.4 2.9*   < > 2.2 2.2 2.4  --   --   PHOS 4.7* 5.0*  --   --   --   --   --   --    < > = values in this interval not displayed.   No results found for: [facility] Liver Function Tests: Recent Labs    02/25/18 0547 02/27/18 0819 03/06/18 1130  AST 35 53* 26  ALT 38 43 29  ALKPHOS 66 56 54  BILITOT 0.3 0.3 0.5  PROT 6.6 6.3* 7.6  ALBUMIN 2.0* 2.2* 3.3*   No results for input(s): LIPASE, AMYLASE in the last 8760 hours. No results for input(s): AMMONIA in the last 8760 hours. CBC: Recent Labs    01/26/18 0913  02/03/18 0551  02/28/18 1001 03/01/18 0518 03/06/18 03/06/18 1130  WBC 5.8   < > 8.8   < > 6.4 8.6 10.5 15.0*  NEUTROABS 2.8  --  6.3  --   --   --   --  13.4*  HGB 12.4*   < > 12.6*   < > 9.6* 9.6* 11.4* 12.1*  HCT 39.6   < > 42.5   < > 30.6* 31.6* 36* 39.1  MCV 82.2   < > 87.1   < > 79.7 82.3  --  82.3  PLT 324   < > 276   < > 496* 478* 581* 665*   < > = values in this interval not displayed.   Lipid Recent Labs    01/27/18 0459 01/30/18 0251 02/02/18 0436  CHOL 231*  --   --   HDL 49  --   --   LDLCALC 111*  --   --   TRIG 357*  348* 142 133   Cardiac Enzymes: Recent Labs    03/06/18 1130  TROPONINI <0.03   BNP: Recent Labs    03/06/18 1130  BNP 96.3   CBG: Recent Labs    03/02/18 0359 03/02/18 0733 03/02/18 1126  GLUCAP 169* 159* 216*    Procedures and Imaging Studies During Stay: Ir Replc Gastro/colonic Tube Percut W/fluoro  Result Date: 04/10/2018 INDICATION: Stroke, with subsequent need for enteral feeding support. Gastrostomy catheter placed 02/06/2018, has worked well, but exchange to a more convenient gastric button is requested. EXAM: GASTROSTOMY CATHETER REPLACEMENT MEDICATIONS: None indicated ANESTHESIA/SEDATION: Viscous lidocaine topically CONTRAST:  18mL ISOVUE-300 IOPAMIDOL (ISOVUE-300) INJECTION 61%, 30mL ISOVUE-300 IOPAMIDOL (ISOVUE-300)  INJECTION 61% - administered into the gastric lumen. PROCEDURE: Informed written consent was obtained from the family after a thorough discussion of the procedural risks, benefits and alternatives. All questions were addressed. Maximal Sterile Barrier Technique was utilized including caps, mask, sterile gowns, sterile gloves, sterile drape, hand hygiene and skin antiseptic. A timeout was performed prior to the initiation of the procedure. The length of the gastrostomy tract had been previously determined. The indwelling gastrostomy catheter and surrounding skin were prepped with Betadine, draped in usual sterile fashion, and liberal viscous lidocaine applied topically the catheter was easily removed  intact with manual traction. A 20 French 2.3 cm MicKey gastrostomy device was then passed through the tract, and the balloon dilated with 10 mL sterile saline. Contrast injection confirmed good positioning of the balloon within the lumen of the decompressed stomach. The catheter was flushed and the access device removed. The patient tolerated the procedure well. FLUOROSCOPY TIME:  0.1 minute; 22 uGym2 DAP COMPLICATIONS: None immediate. IMPRESSION: 1. Technically successful conversion of 20 French gastrostomy catheter to a 20 French 2.3 cm MicKey gastrostomy button device, ready for routine use. Electronically Signed   By: Lucrezia Europe M.D.   On: 04/10/2018 16:16    Assessment/Plan:   Nontraumatic cortical hemorrhage of left cerebral hemisphere [facility])  Intraparenchymal hemorrhage of brain (HCC)  Hemiparesis affecting right side as late effect of stroke (HCC)  Aphasia, post-stroke  Dysphagia, post-stroke  Cytotoxic brain edema (HCC)  IVH (intraventricular hemorrhage) (HCC)  Acute respiratory failure with hypoxia (HCC)  Pneumonia due to infectious organism, unspecified laterality, unspecified part of lung  Aspiration pneumonia of right lower lobe due to milk (HCC)  Drug-induced skin rash  Essential  hypertension  Other specified hypotension  Hyperlipidemia associated with type 2 diabetes mellitus (HCC)  Copious oral secretions  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)  Anemia of chronic disease  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 (HCC)  Status post insertion of percutaneous endoscopic gastrostomy (PEG) tube ([place])   Patient is being discharged with the following home health services: OT/PT/nursing/aid/social work  Patient is being discharged with the following durable medical equipment: Semi-electric hospital bed with half side rails and trapezius bar, mechanical lift, high-back reclining wheelchair with cushion and attached verbal arms, tube feeding supplies, portable suctioning machine with supplies, 3 1 commode with drop arms  Patient has been advised to f/u with their PCP in 1-2 weeks to bring them up to date on their rehab stay.  Social services at facility was responsible for arranging this appointment.  Pt was provided with a 30 day supply of prescriptions for medications and refills must be obtained from their PCP.  For controlled substances, a more limited supply may be provided adequate until PCP appointment only.  Medications have been reconciled  Time spent greater than 30 minutes;> 50% of time with patient was spent reviewing records, labs, tests and studies, counseling and developing plan of care  Adam Yang. Adam Coil, MD

## 2018-04-21 DIAGNOSIS — I69320 Aphasia following cerebral infarction: Secondary | ICD-10-CM | POA: Diagnosis not present

## 2018-04-21 DIAGNOSIS — I129 Hypertensive chronic kidney disease with stage 1 through stage 4 chronic kidney disease, or unspecified chronic kidney disease: Secondary | ICD-10-CM | POA: Diagnosis not present

## 2018-04-21 DIAGNOSIS — I69391 Dysphagia following cerebral infarction: Secondary | ICD-10-CM | POA: Diagnosis not present

## 2018-04-21 DIAGNOSIS — R1310 Dysphagia, unspecified: Secondary | ICD-10-CM | POA: Diagnosis not present

## 2018-04-21 DIAGNOSIS — I69351 Hemiplegia and hemiparesis following cerebral infarction affecting right dominant side: Secondary | ICD-10-CM | POA: Diagnosis not present

## 2018-04-24 ENCOUNTER — Encounter: Payer: Self-pay | Admitting: Internal Medicine

## 2018-04-24 DIAGNOSIS — I69391 Dysphagia following cerebral infarction: Secondary | ICD-10-CM | POA: Diagnosis not present

## 2018-04-24 DIAGNOSIS — E119 Type 2 diabetes mellitus without complications: Secondary | ICD-10-CM | POA: Diagnosis not present

## 2018-04-24 DIAGNOSIS — E440 Moderate protein-calorie malnutrition: Secondary | ICD-10-CM | POA: Diagnosis not present

## 2018-04-24 DIAGNOSIS — I639 Cerebral infarction, unspecified: Secondary | ICD-10-CM | POA: Diagnosis not present

## 2018-04-27 DIAGNOSIS — J181 Lobar pneumonia, unspecified organism: Secondary | ICD-10-CM | POA: Diagnosis not present

## 2018-04-27 DIAGNOSIS — I69391 Dysphagia following cerebral infarction: Secondary | ICD-10-CM | POA: Diagnosis not present

## 2018-04-27 DIAGNOSIS — E119 Type 2 diabetes mellitus without complications: Secondary | ICD-10-CM | POA: Diagnosis not present

## 2018-04-27 DIAGNOSIS — I639 Cerebral infarction, unspecified: Secondary | ICD-10-CM | POA: Diagnosis not present

## 2018-05-02 MED FILL — GLYCOPYRROLATE 1 MG TABS: 1 | 60 days supply | Qty: 60 | Fill #0

## 2018-05-02 MED FILL — METHOCARBAMOL 500 MG TABLET: 500 | 22 days supply | Qty: 90 | Fill #0

## 2018-05-02 MED FILL — METHYLPHENIDATE HCL 5 MG TA: 5 | 30 days supply | Qty: 60 | Fill #0

## 2018-05-10 ENCOUNTER — Encounter: Payer: Self-pay | Admitting: Internal Medicine

## 2018-05-20 DIAGNOSIS — E119 Type 2 diabetes mellitus without complications: Secondary | ICD-10-CM | POA: Diagnosis not present

## 2018-05-20 DIAGNOSIS — I69351 Hemiplegia and hemiparesis following cerebral infarction affecting right dominant side: Secondary | ICD-10-CM | POA: Diagnosis not present

## 2018-05-20 DIAGNOSIS — I69391 Dysphagia following cerebral infarction: Secondary | ICD-10-CM | POA: Diagnosis not present

## 2018-05-20 DIAGNOSIS — M6281 Muscle weakness (generalized): Secondary | ICD-10-CM | POA: Diagnosis not present

## 2018-05-20 DIAGNOSIS — I639 Cerebral infarction, unspecified: Secondary | ICD-10-CM | POA: Diagnosis not present

## 2018-05-20 DIAGNOSIS — E440 Moderate protein-calorie malnutrition: Secondary | ICD-10-CM | POA: Diagnosis not present

## 2018-05-20 DIAGNOSIS — J181 Lobar pneumonia, unspecified organism: Secondary | ICD-10-CM | POA: Diagnosis not present

## 2018-05-28 DIAGNOSIS — I69391 Dysphagia following cerebral infarction: Secondary | ICD-10-CM | POA: Diagnosis not present

## 2018-05-28 DIAGNOSIS — E119 Type 2 diabetes mellitus without complications: Secondary | ICD-10-CM | POA: Diagnosis not present

## 2018-05-28 DIAGNOSIS — J181 Lobar pneumonia, unspecified organism: Secondary | ICD-10-CM | POA: Diagnosis not present

## 2018-05-28 DIAGNOSIS — I639 Cerebral infarction, unspecified: Secondary | ICD-10-CM | POA: Diagnosis not present

## 2018-06-14 MED FILL — tiZANidine HCL 4 MG TABS: 4 | 90 days supply | Qty: 90 | Fill #0

## 2018-06-14 MED FILL — INDOMETHACIN 50 MG CAPSULE: 50 | 30 days supply | Qty: 30 | Fill #0

## 2018-06-14 MED FILL — METHYLPHENIDATE HCL 5 MG TA: 5 | 30 days supply | Qty: 60 | Fill #0

## 2018-06-17 DIAGNOSIS — I639 Cerebral infarction, unspecified: Secondary | ICD-10-CM | POA: Diagnosis not present

## 2018-06-17 DIAGNOSIS — E119 Type 2 diabetes mellitus without complications: Secondary | ICD-10-CM | POA: Diagnosis not present

## 2018-06-17 DIAGNOSIS — E440 Moderate protein-calorie malnutrition: Secondary | ICD-10-CM | POA: Diagnosis not present

## 2018-06-17 DIAGNOSIS — I69391 Dysphagia following cerebral infarction: Secondary | ICD-10-CM | POA: Diagnosis not present

## 2018-06-19 DIAGNOSIS — I69351 Hemiplegia and hemiparesis following cerebral infarction affecting right dominant side: Secondary | ICD-10-CM | POA: Diagnosis not present

## 2018-06-19 DIAGNOSIS — E440 Moderate protein-calorie malnutrition: Secondary | ICD-10-CM | POA: Diagnosis not present

## 2018-06-19 DIAGNOSIS — M6281 Muscle weakness (generalized): Secondary | ICD-10-CM | POA: Diagnosis not present

## 2018-06-19 DIAGNOSIS — E119 Type 2 diabetes mellitus without complications: Secondary | ICD-10-CM | POA: Diagnosis not present

## 2018-06-19 DIAGNOSIS — I639 Cerebral infarction, unspecified: Secondary | ICD-10-CM | POA: Diagnosis not present

## 2018-06-19 DIAGNOSIS — J181 Lobar pneumonia, unspecified organism: Secondary | ICD-10-CM | POA: Diagnosis not present

## 2018-06-19 DIAGNOSIS — I69391 Dysphagia following cerebral infarction: Secondary | ICD-10-CM | POA: Diagnosis not present

## 2018-06-20 DIAGNOSIS — I69391 Dysphagia following cerebral infarction: Secondary | ICD-10-CM | POA: Diagnosis not present

## 2018-06-20 DIAGNOSIS — R1310 Dysphagia, unspecified: Secondary | ICD-10-CM | POA: Diagnosis not present

## 2018-06-20 DIAGNOSIS — I69351 Hemiplegia and hemiparesis following cerebral infarction affecting right dominant side: Secondary | ICD-10-CM | POA: Diagnosis not present

## 2018-06-20 DIAGNOSIS — I69320 Aphasia following cerebral infarction: Secondary | ICD-10-CM | POA: Diagnosis not present

## 2018-06-22 DIAGNOSIS — I69391 Dysphagia following cerebral infarction: Secondary | ICD-10-CM | POA: Diagnosis not present

## 2018-06-22 DIAGNOSIS — I129 Hypertensive chronic kidney disease with stage 1 through stage 4 chronic kidney disease, or unspecified chronic kidney disease: Secondary | ICD-10-CM | POA: Diagnosis not present

## 2018-06-22 DIAGNOSIS — I69320 Aphasia following cerebral infarction: Secondary | ICD-10-CM | POA: Diagnosis not present

## 2018-06-22 DIAGNOSIS — I69351 Hemiplegia and hemiparesis following cerebral infarction affecting right dominant side: Secondary | ICD-10-CM | POA: Diagnosis not present

## 2018-06-22 DIAGNOSIS — R1310 Dysphagia, unspecified: Secondary | ICD-10-CM | POA: Diagnosis not present

## 2018-06-27 DIAGNOSIS — I639 Cerebral infarction, unspecified: Secondary | ICD-10-CM | POA: Diagnosis not present

## 2018-06-27 DIAGNOSIS — E119 Type 2 diabetes mellitus without complications: Secondary | ICD-10-CM | POA: Diagnosis not present

## 2018-06-27 DIAGNOSIS — I69391 Dysphagia following cerebral infarction: Secondary | ICD-10-CM | POA: Diagnosis not present

## 2018-06-27 DIAGNOSIS — J181 Lobar pneumonia, unspecified organism: Secondary | ICD-10-CM | POA: Diagnosis not present

## 2018-07-12 DIAGNOSIS — I639 Cerebral infarction, unspecified: Secondary | ICD-10-CM | POA: Diagnosis not present

## 2018-07-12 DIAGNOSIS — I69391 Dysphagia following cerebral infarction: Secondary | ICD-10-CM | POA: Diagnosis not present

## 2018-07-12 DIAGNOSIS — E440 Moderate protein-calorie malnutrition: Secondary | ICD-10-CM | POA: Diagnosis not present

## 2018-07-12 DIAGNOSIS — E119 Type 2 diabetes mellitus without complications: Secondary | ICD-10-CM | POA: Diagnosis not present

## 2018-07-14 DIAGNOSIS — I639 Cerebral infarction, unspecified: Secondary | ICD-10-CM | POA: Diagnosis not present

## 2018-07-14 DIAGNOSIS — R471 Dysarthria and anarthria: Secondary | ICD-10-CM | POA: Diagnosis not present

## 2018-07-14 DIAGNOSIS — R4701 Aphasia: Secondary | ICD-10-CM | POA: Diagnosis not present

## 2018-07-15 DIAGNOSIS — E440 Moderate protein-calorie malnutrition: Secondary | ICD-10-CM | POA: Diagnosis not present

## 2018-07-15 DIAGNOSIS — I69391 Dysphagia following cerebral infarction: Secondary | ICD-10-CM | POA: Diagnosis not present

## 2018-07-15 DIAGNOSIS — I639 Cerebral infarction, unspecified: Secondary | ICD-10-CM | POA: Diagnosis not present

## 2018-07-15 DIAGNOSIS — E119 Type 2 diabetes mellitus without complications: Secondary | ICD-10-CM | POA: Diagnosis not present

## 2018-07-18 MED FILL — METHYLPHENIDATE HCL 5 MG TA: 5 | 30 days supply | Qty: 60 | Fill #0

## 2018-07-20 DIAGNOSIS — I69391 Dysphagia following cerebral infarction: Secondary | ICD-10-CM | POA: Diagnosis not present

## 2018-07-20 DIAGNOSIS — I69351 Hemiplegia and hemiparesis following cerebral infarction affecting right dominant side: Secondary | ICD-10-CM | POA: Diagnosis not present

## 2018-07-20 DIAGNOSIS — E119 Type 2 diabetes mellitus without complications: Secondary | ICD-10-CM | POA: Diagnosis not present

## 2018-07-20 DIAGNOSIS — E440 Moderate protein-calorie malnutrition: Secondary | ICD-10-CM | POA: Diagnosis not present

## 2018-07-20 DIAGNOSIS — M6281 Muscle weakness (generalized): Secondary | ICD-10-CM | POA: Diagnosis not present

## 2018-07-20 DIAGNOSIS — I639 Cerebral infarction, unspecified: Secondary | ICD-10-CM | POA: Diagnosis not present

## 2018-07-20 DIAGNOSIS — J181 Lobar pneumonia, unspecified organism: Secondary | ICD-10-CM | POA: Diagnosis not present

## 2018-07-25 ENCOUNTER — Ambulatory Visit: Payer: Medicare Other | Admitting: Adult Health

## 2018-07-28 DIAGNOSIS — I639 Cerebral infarction, unspecified: Secondary | ICD-10-CM | POA: Diagnosis not present

## 2018-07-28 DIAGNOSIS — I69391 Dysphagia following cerebral infarction: Secondary | ICD-10-CM | POA: Diagnosis not present

## 2018-07-28 DIAGNOSIS — J181 Lobar pneumonia, unspecified organism: Secondary | ICD-10-CM | POA: Diagnosis not present

## 2018-07-28 DIAGNOSIS — E119 Type 2 diabetes mellitus without complications: Secondary | ICD-10-CM | POA: Diagnosis not present

## 2018-08-12 DIAGNOSIS — I639 Cerebral infarction, unspecified: Secondary | ICD-10-CM | POA: Diagnosis not present

## 2018-08-12 DIAGNOSIS — E119 Type 2 diabetes mellitus without complications: Secondary | ICD-10-CM | POA: Diagnosis not present

## 2018-08-12 DIAGNOSIS — E440 Moderate protein-calorie malnutrition: Secondary | ICD-10-CM | POA: Diagnosis not present

## 2018-08-12 DIAGNOSIS — I69391 Dysphagia following cerebral infarction: Secondary | ICD-10-CM | POA: Diagnosis not present

## 2018-08-19 DIAGNOSIS — C111 Malignant neoplasm of posterior wall of nasopharynx: Secondary | ICD-10-CM | POA: Diagnosis not present

## 2018-08-19 DIAGNOSIS — I69351 Hemiplegia and hemiparesis following cerebral infarction affecting right dominant side: Secondary | ICD-10-CM | POA: Diagnosis not present

## 2018-08-19 DIAGNOSIS — I69320 Aphasia following cerebral infarction: Secondary | ICD-10-CM | POA: Diagnosis not present

## 2018-08-19 DIAGNOSIS — C110 Malignant neoplasm of superior wall of nasopharynx: Secondary | ICD-10-CM | POA: Diagnosis not present

## 2018-08-20 DIAGNOSIS — E119 Type 2 diabetes mellitus without complications: Secondary | ICD-10-CM | POA: Diagnosis not present

## 2018-08-20 DIAGNOSIS — J181 Lobar pneumonia, unspecified organism: Secondary | ICD-10-CM | POA: Diagnosis not present

## 2018-08-20 DIAGNOSIS — I639 Cerebral infarction, unspecified: Secondary | ICD-10-CM | POA: Diagnosis not present

## 2018-08-20 DIAGNOSIS — I69391 Dysphagia following cerebral infarction: Secondary | ICD-10-CM | POA: Diagnosis not present

## 2018-08-23 DIAGNOSIS — M19042 Primary osteoarthritis, left hand: Secondary | ICD-10-CM | POA: Diagnosis not present

## 2018-08-23 DIAGNOSIS — N183 Chronic kidney disease, stage 3 (moderate): Secondary | ICD-10-CM | POA: Diagnosis not present

## 2018-08-23 DIAGNOSIS — I129 Hypertensive chronic kidney disease with stage 1 through stage 4 chronic kidney disease, or unspecified chronic kidney disease: Secondary | ICD-10-CM | POA: Diagnosis not present

## 2018-08-23 DIAGNOSIS — C111 Malignant neoplasm of posterior wall of nasopharynx: Secondary | ICD-10-CM | POA: Diagnosis not present

## 2018-08-23 DIAGNOSIS — R1310 Dysphagia, unspecified: Secondary | ICD-10-CM | POA: Diagnosis not present

## 2018-08-23 DIAGNOSIS — I69351 Hemiplegia and hemiparesis following cerebral infarction affecting right dominant side: Secondary | ICD-10-CM | POA: Diagnosis not present

## 2018-08-23 DIAGNOSIS — F418 Other specified anxiety disorders: Secondary | ICD-10-CM | POA: Diagnosis not present

## 2018-08-23 DIAGNOSIS — I69320 Aphasia following cerebral infarction: Secondary | ICD-10-CM | POA: Diagnosis not present

## 2018-08-23 DIAGNOSIS — D631 Anemia in chronic kidney disease: Secondary | ICD-10-CM | POA: Diagnosis not present

## 2018-08-23 DIAGNOSIS — E440 Moderate protein-calorie malnutrition: Secondary | ICD-10-CM | POA: Diagnosis not present

## 2018-08-23 DIAGNOSIS — M19041 Primary osteoarthritis, right hand: Secondary | ICD-10-CM | POA: Diagnosis not present

## 2018-08-23 DIAGNOSIS — Z431 Encounter for attention to gastrostomy: Secondary | ICD-10-CM | POA: Diagnosis not present

## 2018-08-23 DIAGNOSIS — I69391 Dysphagia following cerebral infarction: Secondary | ICD-10-CM | POA: Diagnosis not present

## 2018-08-23 DIAGNOSIS — E1122 Type 2 diabetes mellitus with diabetic chronic kidney disease: Secondary | ICD-10-CM | POA: Diagnosis not present

## 2018-08-23 DIAGNOSIS — C110 Malignant neoplasm of superior wall of nasopharynx: Secondary | ICD-10-CM | POA: Diagnosis not present

## 2018-08-23 DIAGNOSIS — E785 Hyperlipidemia, unspecified: Secondary | ICD-10-CM | POA: Diagnosis not present

## 2018-08-24 MED FILL — METHYLPHENIDATE HCL 5 MG TA: 5 | 30 days supply | Qty: 60 | Fill #0

## 2018-08-28 DIAGNOSIS — I639 Cerebral infarction, unspecified: Secondary | ICD-10-CM | POA: Diagnosis not present

## 2018-08-28 DIAGNOSIS — J181 Lobar pneumonia, unspecified organism: Secondary | ICD-10-CM | POA: Diagnosis not present

## 2018-08-28 DIAGNOSIS — I69391 Dysphagia following cerebral infarction: Secondary | ICD-10-CM | POA: Diagnosis not present

## 2018-08-28 DIAGNOSIS — E119 Type 2 diabetes mellitus without complications: Secondary | ICD-10-CM | POA: Diagnosis not present

## 2018-09-05 DIAGNOSIS — E119 Type 2 diabetes mellitus without complications: Secondary | ICD-10-CM | POA: Diagnosis not present

## 2018-09-05 DIAGNOSIS — E440 Moderate protein-calorie malnutrition: Secondary | ICD-10-CM | POA: Diagnosis not present

## 2018-09-05 DIAGNOSIS — I639 Cerebral infarction, unspecified: Secondary | ICD-10-CM | POA: Diagnosis not present

## 2018-09-05 DIAGNOSIS — I69391 Dysphagia following cerebral infarction: Secondary | ICD-10-CM | POA: Diagnosis not present

## 2018-09-20 DIAGNOSIS — N183 Chronic kidney disease, stage 3 (moderate): Secondary | ICD-10-CM | POA: Diagnosis not present

## 2018-09-20 DIAGNOSIS — C111 Malignant neoplasm of posterior wall of nasopharynx: Secondary | ICD-10-CM | POA: Diagnosis not present

## 2018-09-20 DIAGNOSIS — E1122 Type 2 diabetes mellitus with diabetic chronic kidney disease: Secondary | ICD-10-CM | POA: Diagnosis not present

## 2018-09-20 DIAGNOSIS — I69320 Aphasia following cerebral infarction: Secondary | ICD-10-CM | POA: Diagnosis not present

## 2018-09-20 DIAGNOSIS — I69351 Hemiplegia and hemiparesis following cerebral infarction affecting right dominant side: Secondary | ICD-10-CM | POA: Diagnosis not present

## 2018-09-20 DIAGNOSIS — R1310 Dysphagia, unspecified: Secondary | ICD-10-CM | POA: Diagnosis not present

## 2018-09-20 DIAGNOSIS — I129 Hypertensive chronic kidney disease with stage 1 through stage 4 chronic kidney disease, or unspecified chronic kidney disease: Secondary | ICD-10-CM | POA: Diagnosis not present

## 2018-09-20 DIAGNOSIS — C110 Malignant neoplasm of superior wall of nasopharynx: Secondary | ICD-10-CM | POA: Diagnosis not present

## 2018-09-20 DIAGNOSIS — I69391 Dysphagia following cerebral infarction: Secondary | ICD-10-CM | POA: Diagnosis not present

## 2018-09-20 DIAGNOSIS — E440 Moderate protein-calorie malnutrition: Secondary | ICD-10-CM | POA: Diagnosis not present

## 2018-09-28 MED FILL — METHYLPHENIDATE HCL 5 MG TA: 5 | 30 days supply | Qty: 60 | Fill #0

## 2018-09-28 MED FILL — CHLORHEXIDINE 0.12% RINSE: 0.12 | 7 days supply | Qty: 473 | Fill #0

## 2018-09-28 MED FILL — AMLODIPINE BESYLATE 5 MG TA: 5 | 90 days supply | Qty: 90 | Fill #0

## 2018-10-01 ENCOUNTER — Telehealth: Payer: Self-pay | Admitting: Interventional Radiology

## 2018-10-01 NOTE — Telephone Encounter (Signed)
Paged x10 (!) @ 3am to call daughter NOS. No answer. Tried again this AM. No answer. Phone # confirmed with demographics in [place]. Note: for future reference IR on call via [phone]

## 2018-10-04 DIAGNOSIS — E440 Moderate protein-calorie malnutrition: Secondary | ICD-10-CM | POA: Diagnosis not present

## 2018-10-04 DIAGNOSIS — I69391 Dysphagia following cerebral infarction: Secondary | ICD-10-CM | POA: Diagnosis not present

## 2018-10-04 DIAGNOSIS — I639 Cerebral infarction, unspecified: Secondary | ICD-10-CM | POA: Diagnosis not present

## 2018-10-04 DIAGNOSIS — E119 Type 2 diabetes mellitus without complications: Secondary | ICD-10-CM | POA: Diagnosis not present

## 2018-10-06 MED FILL — tiZANidine HCL 4 MG TABS: 4 | 90 days supply | Qty: 90 | Fill #0

## 2018-10-19 DIAGNOSIS — E119 Type 2 diabetes mellitus without complications: Secondary | ICD-10-CM | POA: Diagnosis not present

## 2018-10-19 DIAGNOSIS — I69391 Dysphagia following cerebral infarction: Secondary | ICD-10-CM | POA: Diagnosis not present

## 2018-10-19 DIAGNOSIS — I639 Cerebral infarction, unspecified: Secondary | ICD-10-CM | POA: Diagnosis not present

## 2018-10-19 DIAGNOSIS — E440 Moderate protein-calorie malnutrition: Secondary | ICD-10-CM | POA: Diagnosis not present

## 2018-10-23 ENCOUNTER — Emergency Department (HOSPITAL_COMMUNITY): Payer: Medicare Other

## 2018-10-23 ENCOUNTER — Other Ambulatory Visit: Payer: Self-pay

## 2018-10-23 ENCOUNTER — Encounter (HOSPITAL_COMMUNITY): Payer: Self-pay

## 2018-10-23 ENCOUNTER — Emergency Department (EMERGENCY_DEPARTMENT_HOSPITAL)
Admission: EM | Admit: 2018-10-23 | Discharge: 2018-10-23 | Disposition: A | Discharge status: Home / Self Care | Payer: Medicare Other | Source: Home / Self Care | Attending: Emergency Medicine | Admitting: Emergency Medicine

## 2018-10-23 DIAGNOSIS — Z79899 Other long term (current) drug therapy: Secondary | ICD-10-CM

## 2018-10-23 DIAGNOSIS — I69322 Dysarthria following cerebral infarction: Secondary | ICD-10-CM | POA: Diagnosis not present

## 2018-10-23 DIAGNOSIS — I10 Essential (primary) hypertension: Secondary | ICD-10-CM | POA: Diagnosis present

## 2018-10-23 DIAGNOSIS — E785 Hyperlipidemia, unspecified: Secondary | ICD-10-CM | POA: Diagnosis present

## 2018-10-23 DIAGNOSIS — E119 Type 2 diabetes mellitus without complications: Secondary | ICD-10-CM

## 2018-10-23 DIAGNOSIS — J189 Pneumonia, unspecified organism: Secondary | ICD-10-CM

## 2018-10-23 DIAGNOSIS — N183 Chronic kidney disease, stage 3 unspecified: Secondary | ICD-10-CM | POA: Diagnosis present

## 2018-10-23 DIAGNOSIS — Y9389 Activity, other specified: Secondary | ICD-10-CM | POA: Diagnosis not present

## 2018-10-23 DIAGNOSIS — I69391 Dysphagia following cerebral infarction: Secondary | ICD-10-CM | POA: Diagnosis not present

## 2018-10-23 DIAGNOSIS — E1129 Type 2 diabetes mellitus with other diabetic kidney complication: Secondary | ICD-10-CM | POA: Diagnosis present

## 2018-10-23 DIAGNOSIS — Z923 Personal history of irradiation: Secondary | ICD-10-CM | POA: Diagnosis not present

## 2018-10-23 DIAGNOSIS — R0603 Acute respiratory distress: Secondary | ICD-10-CM | POA: Diagnosis not present

## 2018-10-23 DIAGNOSIS — Z87891 Personal history of nicotine dependence: Secondary | ICD-10-CM

## 2018-10-23 DIAGNOSIS — J9690 Respiratory failure, unspecified, unspecified whether with hypoxia or hypercapnia: Secondary | ICD-10-CM | POA: Diagnosis not present

## 2018-10-23 DIAGNOSIS — I129 Hypertensive chronic kidney disease with stage 1 through stage 4 chronic kidney disease, or unspecified chronic kidney disease: Secondary | ICD-10-CM | POA: Diagnosis not present

## 2018-10-23 DIAGNOSIS — Z515 Encounter for palliative care: Secondary | ICD-10-CM | POA: Diagnosis not present

## 2018-10-23 DIAGNOSIS — R0902 Hypoxemia: Secondary | ICD-10-CM | POA: Diagnosis not present

## 2018-10-23 DIAGNOSIS — F419 Anxiety disorder, unspecified: Secondary | ICD-10-CM | POA: Diagnosis not present

## 2018-10-23 DIAGNOSIS — Y92238 Other place in hospital as the place of occurrence of the external cause: Secondary | ICD-10-CM | POA: Diagnosis present

## 2018-10-23 DIAGNOSIS — I69320 Aphasia following cerebral infarction: Secondary | ICD-10-CM | POA: Diagnosis not present

## 2018-10-23 DIAGNOSIS — D638 Anemia in other chronic diseases classified elsewhere: Secondary | ICD-10-CM | POA: Diagnosis present

## 2018-10-23 DIAGNOSIS — Z931 Gastrostomy status: Secondary | ICD-10-CM | POA: Diagnosis not present

## 2018-10-23 DIAGNOSIS — I69351 Hemiplegia and hemiparesis following cerebral infarction affecting right dominant side: Secondary | ICD-10-CM | POA: Diagnosis not present

## 2018-10-23 DIAGNOSIS — K9423 Gastrostomy malfunction: Secondary | ICD-10-CM | POA: Diagnosis not present

## 2018-10-23 DIAGNOSIS — X58XXXA Exposure to other specified factors, initial encounter: Secondary | ICD-10-CM | POA: Diagnosis present

## 2018-10-23 DIAGNOSIS — J9601 Acute respiratory failure with hypoxia: Secondary | ICD-10-CM | POA: Diagnosis not present

## 2018-10-23 DIAGNOSIS — J9811 Atelectasis: Secondary | ICD-10-CM | POA: Diagnosis not present

## 2018-10-23 DIAGNOSIS — G4731 Primary central sleep apnea: Secondary | ICD-10-CM | POA: Diagnosis present

## 2018-10-23 DIAGNOSIS — Z7984 Long term (current) use of oral hypoglycemic drugs: Secondary | ICD-10-CM

## 2018-10-23 DIAGNOSIS — Z85819 Personal history of malignant neoplasm of unspecified site of lip, oral cavity, and pharynx: Secondary | ICD-10-CM | POA: Diagnosis present

## 2018-10-23 DIAGNOSIS — T17990A Other foreign object in respiratory tract, part unspecified in causing asphyxiation, initial encounter: Secondary | ICD-10-CM | POA: Diagnosis not present

## 2018-10-23 DIAGNOSIS — E1122 Type 2 diabetes mellitus with diabetic chronic kidney disease: Secondary | ICD-10-CM | POA: Diagnosis not present

## 2018-10-23 DIAGNOSIS — R069 Unspecified abnormalities of breathing: Secondary | ICD-10-CM | POA: Diagnosis not present

## 2018-10-23 DIAGNOSIS — J181 Lobar pneumonia, unspecified organism: Secondary | ICD-10-CM | POA: Diagnosis not present

## 2018-10-23 DIAGNOSIS — R0602 Shortness of breath: Secondary | ICD-10-CM | POA: Diagnosis not present

## 2018-10-23 DIAGNOSIS — R000 Tachycardia, unspecified: Secondary | ICD-10-CM | POA: Diagnosis not present

## 2018-10-23 DIAGNOSIS — N179 Acute kidney failure, unspecified: Secondary | ICD-10-CM | POA: Diagnosis not present

## 2018-10-23 DIAGNOSIS — M109 Gout, unspecified: Secondary | ICD-10-CM | POA: Diagnosis present

## 2018-10-23 DIAGNOSIS — N189 Chronic kidney disease, unspecified: Secondary | ICD-10-CM

## 2018-10-23 DIAGNOSIS — E440 Moderate protein-calorie malnutrition: Secondary | ICD-10-CM | POA: Diagnosis not present

## 2018-10-23 DIAGNOSIS — R6889 Other general symptoms and signs: Secondary | ICD-10-CM | POA: Diagnosis present

## 2018-10-23 DIAGNOSIS — Z20828 Contact with and (suspected) exposure to other viral communicable diseases: Secondary | ICD-10-CM | POA: Diagnosis not present

## 2018-10-23 DIAGNOSIS — Z85818 Personal history of malignant neoplasm of other sites of lip, oral cavity, and pharynx: Secondary | ICD-10-CM | POA: Diagnosis not present

## 2018-10-23 DIAGNOSIS — R061 Stridor: Secondary | ICD-10-CM | POA: Diagnosis not present

## 2018-10-23 DIAGNOSIS — Z66 Do not resuscitate: Secondary | ICD-10-CM | POA: Diagnosis not present

## 2018-10-23 DIAGNOSIS — Z6820 Body mass index (BMI) 20.0-20.9, adult: Secondary | ICD-10-CM | POA: Diagnosis not present

## 2018-10-23 DIAGNOSIS — J80 Acute respiratory distress syndrome: Secondary | ICD-10-CM | POA: Diagnosis not present

## 2018-10-23 DIAGNOSIS — R1310 Dysphagia, unspecified: Secondary | ICD-10-CM | POA: Diagnosis not present

## 2018-10-23 DIAGNOSIS — R404 Transient alteration of awareness: Secondary | ICD-10-CM | POA: Diagnosis not present

## 2018-10-23 LAB — COMPREHENSIVE METABOLIC PANEL
ALT: 17 U/L (ref 0–44)
AST: 18 U/L (ref 15–41)
Albumin: 3.4 g/dL — ABNORMAL LOW (ref 3.5–5.0)
Alkaline Phosphatase: 73 U/L (ref 38–126)
Anion gap: 13 (ref 5–15)
BUN: 52 mg/dL — ABNORMAL HIGH (ref 8–23)
CO2: 24 mmol/L (ref 22–32)
Calcium: 9.6 mg/dL (ref 8.9–10.3)
Chloride: 100 mmol/L (ref 98–111)
Creatinine, Ser: 1.91 mg/dL — ABNORMAL HIGH (ref 0.61–1.24)
GFR calc Af Amer: 40 mL/min — ABNORMAL LOW (ref >60)
GFR calc non Af Amer: 35 mL/min — ABNORMAL LOW (ref >60)
Glucose, Bld: 206 mg/dL — ABNORMAL HIGH (ref 70–99)
Potassium: 4.1 mmol/L (ref 3.5–5.1)
Sodium: 137 mmol/L (ref 135–145)
Total Bilirubin: 0.6 mg/dL (ref 0.3–1.2)
Total Protein: 7.4 g/dL (ref 6.5–8.1)

## 2018-10-23 LAB — LACTIC ACID, PLASMA
Lactic Acid, Venous: 1.9 mmol/L (ref 0.5–1.9)
Lactic Acid, Venous: 1.3 mmol/L (ref 0.5–1.9)

## 2018-10-23 LAB — CBC WITH DIFFERENTIAL/PLATELET
Abs Immature Granulocytes: 0.07 10*3/uL (ref 0.00–0.07)
Basophils Absolute: 0 10*3/uL (ref 0.0–0.1)
Basophils Relative: 0 %
Eosinophils Absolute: 0.4 10*3/uL (ref 0.0–0.5)
Eosinophils Relative: 3 %
HCT: 33.4 % — ABNORMAL LOW (ref 39.0–52.0)
Hemoglobin: 10.1 g/dL — ABNORMAL LOW (ref 13.0–17.0)
Immature Granulocytes: 1 %
Lymphocytes Relative: 4 %
Lymphs Abs: 0.6 10*3/uL — ABNORMAL LOW (ref 0.7–4.0)
MCH: 25.3 pg — ABNORMAL LOW (ref 26.0–34.0)
MCHC: 30.2 g/dL (ref 30.0–36.0)
MCV: 83.5 fL (ref 80.0–100.0)
Monocytes Absolute: 1.3 10*3/uL — ABNORMAL HIGH (ref 0.1–1.0)
Monocytes Relative: 9 %
Neutro Abs: 12.3 10*3/uL — ABNORMAL HIGH (ref 1.7–7.7)
Neutrophils Relative %: 83 %
Platelets: 249 10*3/uL (ref 150–400)
RBC: 4 MIL/uL — ABNORMAL LOW (ref 4.22–5.81)
RDW: 16.6 % — ABNORMAL HIGH (ref 11.5–15.5)
WBC: 14.7 10*3/uL — ABNORMAL HIGH (ref 4.0–10.5)
nRBC: 0 % (ref 0.0–0.2)

## 2018-10-23 LAB — BRAIN NATRIURETIC PEPTIDE: B Natriuretic Peptide: 87.9 pg/mL (ref 0.0–100.0)

## 2018-10-23 MED ORDER — SODIUM CHLORIDE 0.9 % IV SOLN
Freq: Once | INTRAVENOUS | Status: AC
  Administered 2018-10-23: 11:00:00 via INTRAVENOUS

## 2018-10-23 NOTE — ED Provider Notes (Signed)
[facility] EMERGENCY DEPARTMENT Provider Note   CSN: [id number] Arrival date & time: 10/23/18  7867    History   Chief Complaint Chief Complaint  Patient presents with  . Respiratory Distress    HPI Adam Yang is a 73 y.o. male.     HPI  73 year old male comes in a chief complaint of respiratory distress and shortness of breath.  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  According to EMS, call when out because patient was in respiratory distress.  When they arrived patient was coughing had stridulous breath sounds and was hypoxic.  They provided patient with oxygen and were able to remove thick phlegm with blow back.  Patient states that he was doing well until earlier this morning when he suddenly started coughing and feeling short of breath.  He denies any recent fevers or flulike symptoms.  Patient also states that he has not left his home and that he resides with his wife and children who have not been diagnosed with COVID-19.  I spoke with patient's wife at [phone]. She reports that patient has had difficulty with secretions after his stroke and typically she suctions him throughout the day.  Patient also has a chronic cough because of his difficulty in managing his secretions.  This morning she noted that patient was in respiratory distress, he was hypoxic and he was diaphoretic which is why she called EMS.  She does indicate that her children have gone in and out of the house over the past few days, but the patient himself is not left the house over the last several weeks.  She also indicates that there has not been any fevers or flulike symptoms over the past few days.  Past Medical History:  Diagnosis Date  . Anxiety    Claustrophobia  . Arthritis   . Cancer (HCC)    nasopharynx  . Chronic cough    EXCESS MUCUS BUILDUP SINCE ORAL SURGERY FROM CANCER   . Chronic kidney disease    Renal Insuffiency , Creatinine has been in the 2- 3 range, 11/27/14 Creatine 1.4  . Diabetes mellitus without complication ([place])    Type II  . Gout   . History of radiation therapy 12/23/2014- 02/10/2015   Nasopharynx / Neck Adenopathy  . Hyperlipidemia   . Hypertension   . Inguinal hernia   . Right inguinal hernia 08/23/2017    Patient Active Problem List   Diagnosis Date Noted  . 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 Emphysema of lung ([place]) 03/22/2018  . CVA (cerebrovascular accident due to intracerebral hemorrhage) ([place]) 03/05/2018  . Acute respiratory failure with hypoxia ([place]) 03/05/2018  . Hypotension 03/05/2018  . Hyperlipidemia associated with type 2 diabetes mellitus ([place]) 03/05/2018  . Copious oral secretions   . Goals of care, counseling/discussion   . Palliative care by specialist   . Malnutrition of moderate degree 02/24/2018  . Pneumonia of both lungs due to infectious organism 02/23/2018  . Pneumonia due to infectious organism 02/23/2018  . Aspiration pneumonia ([place]) 02/22/2018  . Aspiration pneumonia of right lower lobe due to milk ([place])   . Dysphasia   . Anemia of chronic disease   . Hypernatremia   . Transaminitis   . Hypertensive emergency 02/02/2018  . Intraparenchymal hemorrhage of brain ([place]) 02/02/2018  . Hemiparesis affecting right side as late effect of  stroke ([place])   . Aphasia, post-stroke   . Dysphagia, post-stroke   . Nontraumatic subcortical hemorrhage of left cerebral hemisphere ([place])   . Diabetes mellitus type 2 in nonobese (HCC)   . AKI (acute kidney injury) ([place])   . Hyponatremia   . Chronic kidney disease   . Cytotoxic brain edema ([place]) 01/28/2018  . Brain herniation ([place]) 01/28/2018  . IVH (intraventricular hemorrhage) ([place]) 01/27/2018  . Right inguinal hernia 08/23/2017  . Chronic kidney disease, stage III (moderate)  ([place]) 02/18/2017  . 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 Other fatigue 06/18/2016  . Essential hypertension 10/17/2015  . Constipation 02/06/2015  . Drug-induced skin rash 02/03/2015  . Mucositis due to chemotherapy 01/24/2015  . Hyperglycemia due to type 2 diabetes mellitus ([place]) 01/07/2015  . Chronic anxiety 12/19/2014  . Stage 2 chronic renal impairment associated with type 2 diabetes mellitus ([place]) 12/19/2014  . Type 2 diabetes mellitus, controlled, with renal complications ([place]) [birth year]/1962  . Status post insertion of percutaneous endoscopic gastrostomy (PEG) tube ([place]) 12/19/2014  . History of nasopharyngeal cancer 11/26/2014    Past Surgical History:  Procedure Laterality Date  . colonoscopy    . COLONOSCOPY W/ POLYPECTOMY    . INGUINAL HERNIA REPAIR Right 08/23/2017   Procedure: OPEN REPAIR RIGHT INGUINAL HERNIA WITH MESH;  Surgeon: Fanny Skates, MD;  Location: [facility];  Service: General;  Laterality: Right;  GENERAL AND TAP BLOCK  . INSERTION OF MESH Right 08/23/2017   Procedure: INSERTION OF MESH;  Surgeon: Fanny Skates, MD;  Location: [facility];  Service: General;  Laterality: Right;  GENERAL AND TAP BLOCK  . IR GASTROSTOMY TUBE MOD SED  02/06/2018  . IR [place] TUBE PERCUT W/FLUORO  04/10/2018  . KNEE SURGERY Left 1990's   tibia crushed, pinned and bone grafted  . LYMPH NODE BIOPSY    . MULTIPLE EXTRACTIONS WITH ALVEOLOPLASTY N/A 12/06/2014   Procedure: Extraction of tooth #'s 2,4,5,6,7,8,9,10,11,12,13,14,15, 20,22,26,27,28,29 with alveoloplasty.;  Surgeon: Lenn Cal, DDS;  Location: [facility];  Service: Oral Surgery;  Laterality: N/A;        Home Medications    Prior to Admission medications   Medication Sig Start Date End Date Taking? Authorizing Provider  acetaminophen (TYLENOL) 80 MG/0.8ML suspension Take 1,000 mg by mouth 3 (three) times daily as needed for fever.    [provider]   amLODipine (NORVASC) 10 MG tablet Take 1 tablet (10 mg total) by mouth daily. Patient taking differently: Place 10 mg into feeding tube daily.  02/03/18   Donzetta Starch, NP  artificial tears (LACRILUBE) OINT ophthalmic ointment Place into both eyes every 3 (three) hours as needed for dry eyes. 02/02/18   Donzetta Starch, NP  chlorhexidine (PERIDEX) 0.12 % solution 15 mLs by Mouth Rinse route 2 (two) times daily. 02/02/18   Donzetta Starch, NP  docusate (COLACE) 50 MG/5ML liquid Take by mouth daily. Give 10 mg ( 10 ml ) via peg tube daily for constipation    [provider]  glycopyrrolate (ROBINUL) 1 MG tablet Place 1 mg into feeding tube 2 (two) times daily.    [provider]  lisinopril (PRINIVIL,ZESTRIL) 20 MG tablet Take 1 tablet (20 mg total) by mouth daily. 02/02/18   Donzetta Starch, NP  metFORMIN (GLUCOPHAGE) 500 MG tablet Place 500 mg into feeding tube daily with breakfast.    [provider]  methylphenidate (RITALIN) 5 MG tablet Place 5 mg into  feeding tube 2 (two) times daily.    [provider]  metoprolol tartrate (LOPRESSOR) 25 MG tablet Place 12.5 mg into feeding tube 2 (two) times daily.     [provider]  Multiple Vitamin (MULTIVITAMIN) LIQD Place 15 mLs into feeding tube daily.    [provider]  Nutritional Supplements (GLUCERNA 1.5 CAL PO) Take 237 mLs by mouth every 3 (three) hours while awake. 6 am, 9a, 12p, 3 pm, 6p , 9p       Free water flush 200 cc with each meal    [provider]  nystatin (MYCOSTATIN) 100000 UNIT/ML suspension Use as directed 5 mLs (500,000 Units total) in the mouth or throat 4 (four) times daily. 02/02/18   Donzetta Starch, NP  polyethylene glycol (MIRALAX / GLYCOLAX) packet Take 17 g by mouth daily. 02/03/18   Donzetta Starch, NP  tiZANidine (ZANAFLEX) 4 MG tablet Place 4 mg into feeding tube at bedtime.  12/12/17   [provider]    Family History Family History  Problem Relation Age  of Onset  . Cancer Mother        throat ca  . Cancer Father        lung ca  . Cancer Brother        throat ca  . Heart block Brother     Social History Social History   Tobacco Use  . Smoking status: Former Smoker    Packs/day: 1.00    Years: 25.00    Pack years: 25.00    Last attempt to quit: 07/12/1998    Years since quitting: 20.2  . Smokeless tobacco: Never Used  Substance Use Topics  . Alcohol use: No  . Drug use: No     Allergies   Cefepime and Flagyl [metronidazole]   Review of Systems Review of Systems  Unable to perform ROS: Severe respiratory distress  Constitutional: Positive for activity change.  Respiratory: Positive for cough and shortness of breath.   Cardiovascular: Negative for chest pain.  Gastrointestinal: Negative for vomiting.  Allergic/Immunologic: Negative for immunocompromised state.  Hematological: Does not bruise/bleed easily.     Physical Exam Updated Vital Signs BP (!) 143/99   Pulse 96   Temp 98.5 F (36.9 C) (Rectal)   Resp (!) 24   Ht 5\' 9"  (1.753 m)   Wt 70.3 kg   SpO2 98%   BMI 22.89 kg/m   Physical Exam Vitals signs and nursing note reviewed.  Constitutional:      Appearance: He is well-developed.  HENT:     Head: Atraumatic.     Mouth/Throat:     Comments: No oral mucosa swelling.  Patient has large amount of thick, yellow phlegm at the posterior pharynx Eyes:     Conjunctiva/sclera: Conjunctivae normal.     Pupils: Pupils are equal, round, and reactive to light.  Neck:     Musculoskeletal: Normal range of motion and neck supple.  Cardiovascular:     Rate and Rhythm: Tachycardia present.     Comments: No JVD and no pitting edema Pulmonary:     Effort: Respiratory distress present.     Breath sounds: Stridor present. No wheezing, rhonchi or rales.     Comments: Patient has stridulous breath sounds along with bibasilar rales Abdominal:     General: Bowel sounds are normal. There is no distension.      Palpations: Abdomen is soft.     Tenderness: There is no abdominal tenderness. There is no  guarding or rebound.  Musculoskeletal:     Right lower leg: No edema.     Left lower leg: No edema.  Skin:    General: Skin is warm.  Neurological:     Mental Status: He is alert and oriented to person, place, and time.      ED Treatments / Results  Labs (all labs ordered are listed, but only abnormal results are displayed) Labs Reviewed  COMPREHENSIVE METABOLIC PANEL - Abnormal; Notable for the following components:      Result Value   Glucose, Bld 206 (*)    BUN 52 (*)    Creatinine, Ser 1.91 (*)    Albumin 3.4 (*)    GFR calc non Af Amer 35 (*)    GFR calc Af Amer 40 (*)    All other components within normal limits  CBC WITH DIFFERENTIAL/PLATELET - Abnormal; Notable for the following components:   WBC 14.7 (*)    RBC 4.00 (*)    Hemoglobin 10.1 (*)    HCT 33.4 (*)    MCH 25.3 (*)    RDW 16.6 (*)    Neutro Abs 12.3 (*)    Lymphs Abs 0.6 (*)    Monocytes Absolute 1.3 (*)    All other components within normal limits  CULTURE, BLOOD (ROUTINE X 2)  CULTURE, BLOOD (ROUTINE X 2)  LACTIC ACID, PLASMA  LACTIC ACID, PLASMA  BRAIN NATRIURETIC PEPTIDE  URINALYSIS, ROUTINE W REFLEX MICROSCOPIC    EKG EKG Interpretation  Date/Time:  Monday October 23 2018 08:31:22 EDT Ventricular Rate:  118 PR Interval:    QRS Duration: 92 QT Interval:  314 QTC Calculation: 442 R Axis:   15 Text Interpretation:  Sinus tachycardia Biatrial enlargement Abnormal R-wave progression, late transition No acute changes Confirmed by Varney Biles ([id number]) on 10/23/2018 8:51:26 AM   Radiology Dg Chest Port 1 View  Result Date: 10/23/2018 CLINICAL DATA:  Respiratory distress EXAM: PORTABLE CHEST 1 VIEW COMPARISON:  03/06/2018 FINDINGS: Normal heart size. Low lung volumes. Bibasilar atelectasis. No pneumothorax or pleural effusion. IMPRESSION: Bibasilar atelectasis. Electronically Signed   By: Marybelle Killings  M.D.   On: 10/23/2018 10:00    Procedures .Critical Care Performed by: Varney Biles, MD Authorized by: Varney Biles, MD   Critical care provider statement:    Critical care time (minutes):  45   Critical care was necessary to treat or prevent imminent or life-threatening deterioration of the following conditions:  Respiratory failure   Critical care was time spent personally by me on the following activities:  Discussions with consultants, evaluation of patient's response to treatment, examination of patient, ordering and performing treatments and interventions, ordering and review of laboratory studies, ordering and review of radiographic studies, pulse oximetry, re-evaluation of patient's condition, obtaining history from patient or surrogate, review of old charts and development of treatment plan with patient or surrogate   (including critical care time)  Medications Ordered in ED Medications  0.9 %  sodium chloride infusion ( Intravenous Bolus from Bag 10/23/18 1123)     Initial Impression / Assessment and Plan / ED Course  I have reviewed the triage vital signs and the nursing notes.  Pertinent labs & imaging results that were available during my care of the patient were reviewed by me and considered in my medical decision making (see chart for details).  Clinical Course as of Oct 23 1254  Mon Oct 23, 2018  1006 Patient's white count is elevated at 14 and he has neutrophilia.  Chest x-ray is not showing any focal opacity. It appears that this is likely chemical pneumonitis.  We will discuss the case with hospitalist to see if they would like antibiotic coverage.  DG Chest Port 1 View [AN]  1046 Reassessed x 2. Will consult Hospitalist for potential admission. No O2 requirement anymore. Clinically, pt is not septic. Hospitalist might want antibiotics for aspiration pneumonitis.    [AN]  1048 Baseline Cr is 1.55 - will start infusion. Dr. Lorin Mercy and I discussed the case  and covid-19 test to be initiated.  Creatinine(!): 1.91 [AN]  1250 Pt has been weaned off of O2. He is feeling a lot better. O2 sats, HR have improved. There is no resp distress. Hospitalist has independently assessed. Dr. Lorin Mercy agrees- pt doesn't need covid testing nor does he need admission.    [AN]    Clinical Course User Index [AN] Varney Biles, MD      73 year old male comes in a chief complaint of respiratory distress and hypoxia.  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  Patient is noted to be in mild respiratory distress.  He was hypoxic in room air and has been placed on 2 L of nasal cannula, which is helping him maintain O2 sats at 94 to 95%.  He clearly had large amount of secretions in the posterior pharynx which we suctioned at the bedside.  Patient is noted to have rales at both of his bases, he has had no flulike symptoms and this appears to have been a sudden onset event.  Initial impression is that patient likely has pulmonary edema versus aspiration pneumonia.  COVID-19 is in the differential diagnosis for the pneumonia, however this appears to have been a sudden event and there are no fevers or shortness of breath preceding this abrupt onset of respiratory distress, which makes COVID-19 less likely.   Adam Yang was evaluated in Emergency Department on 10/23/2018 for the symptoms described in the history of present illness. He was evaluated in the context of the global COVID-19 pandemic, which necessitated consideration that the patient might be at risk for infection with the SARS-CoV-2 virus that causes COVID-19. Institutional protocols and algorithms that pertain to the evaluation of patients at risk for COVID-19 are in a state of rapid change based on information released by regulatory bodies including  the CDC and federal and state organizations. These policies and algorithms were followed during the patient's care in the ED.   I called the wife on 2 occasions, prior to the initiation of work-up and once again once the results of the imaging had come back and patient was weaned off of oxygen.  She has been made aware of patient's prognosis and diagnosis.  She also has been made aware of testing criteria at [facility] for COVID-19, and how her husband will not meet the testing criteria.  Self quarantine has been recommended for 7 days of or for 3 straight days without fevers.  Strict ER return precautions also discussed with her.  Final Clinical Impressions(s) / ED Diagnoses   Final diagnoses:  Acute respiratory failure with hypoxia [facility])  Pneumonitis    ED Discharge Orders    None       Varney Biles, MD 10/24/18 [phone]

## 2018-10-23 NOTE — ED Triage Notes (Addendum)
EMS stated pt was reporting stridor lung sounds & family reporting they woke up to hear pt sounding "in distress." Pt was able to clear some of secretions with "back blows" (per EMS) & maintain 98% spO2 via NRB.

## 2018-10-23 NOTE — ED Notes (Signed)
Pt clearing airway with strong cough at command, SpO2 remains 94% RA since NRB mask was removed.

## 2018-10-23 NOTE — ED Notes (Addendum)
Wife: Glyn Gerads [phone] Daughter: Summer Gonyer [phone] Son-in-Law: Gordy Savers [phone] **Pt has a copy of his Last Eugenie Birks and Testament, Durable Power of Oneta Rack and his [place] at bedside.**

## 2018-10-23 NOTE — ED Notes (Signed)
Wife sent two feeding tube extension sets for the pt to have at bedside from home to make sure he has it if needed.

## 2018-10-23 NOTE — H&P (Signed)
History and Physical    Adam Yang YTK:[id number] DOB: 1946/01/05 DOA: 10/23/2018  PCP: Lawerance Cruel, MD Patient coming from: home; NOK: Wife, [phone]  Chief Complaint: respiratory distress  HPI: Adam Yang is a 73 y.o. male with medical history significant of nasopharyngeal cancer status post radiation, hypertension, diabetes, hyperlipidemia, who presented on 7/18 for weakness and was found to have a left frontoparietal bleed.  Patient was admitted to [facility] from 7/18-8/15 where MRI was attempted but was unable to be performed secondary to patient continually dropping his heart rate into the 30s.  Patient was admitted to the ICU with global aphasia, right hemiplegia and right neglect.  He improved well enough to be transferred to CIR.  On 7/25 he was found to be unresponsive with a blood pressure of 76/48.  Patient had continuous problems throughout his stay with blood pressures being extremely high then extremely low and he was transferred in and out of CIR several times.  On 8/10 he was found to have bibasilar pneumonia and he was started on cefepime and Flagyl for possible aspiration pneumonia.  Patient developed a rash which was felt to be a drug rash which did not cause any problems other than being itchy.  Patient improved on antibiotics and was able to be weaned off O2 to room air.  Patient had a PEG placed and feedings were started.  Patient was seen by palliative care who started Robinul for excessive secretions.  Patient was started on prednisone on 8/19 for his rash which was improved by 820.  Patient did not make any gains in therapy and CIR so SNF was recommended.  Patient was admitted to skilled nursing facility for OT/PT.  He was discharged to home in 10/19 with a lifelong feeding tube.  Overnight, he had an apparent event at home.  He was doing well without apparent difficulty other than intermittent excessive secretions requiring suctioning.  About 0530, he  developed acute respiratory distress that did not improve with suctioning and so 911 was called.  He was effectively suctioned via EMS and in the ER and was no longer having difficulty at the time of my evaluation.  He was 96% on room air.  He has chronic aphasia and so was generally unable to effectively answer questions.   ED Course:   Likely aspiration pneumonitis, not given antibiotics.  H/o dysphagia, has feeding tube.  H/o aspiration last year.  Chronic difficulty with secretions.  No fevers or SOB until 0530 - acute respiratory distress with hypoxia, sweating.  Unable to suction at home but EMS and ER successful in suctioning.  Some improvement over time.  CXR negative.  He is on 2L [place] O2, 94-95%.  No known COVID risk.  Son and DIL live at home and have gone in and out.  Has not been tested but likely needs testing.  Consider d/c to home.   Review of Systems: Unable to perform  PMH, PSH, SH,and FH were reviewed in Epic   Past Medical History:  Diagnosis Date  . Anxiety    Claustrophobia  . Arthritis   . Cancer (HCC)    nasopharynx  . Chronic cough    EXCESS MUCUS BUILDUP SINCE ORAL SURGERY FROM CANCER   . Chronic kidney disease    Renal Insuffiency , Creatinine has been in the 2- 3 range, 11/27/14 Creatine 1.4  . Diabetes mellitus without complication ([place])    Type II  . Gout   . History of radiation therapy  12/23/2014- 02/10/2015   Nasopharynx / Neck Adenopathy  . Hyperlipidemia   . Hypertension   . Inguinal hernia   . Right inguinal hernia 08/23/2017    Past Surgical History:  Procedure Laterality Date  . colonoscopy    . COLONOSCOPY W/ POLYPECTOMY    . INGUINAL HERNIA REPAIR Right 08/23/2017   Procedure: OPEN REPAIR RIGHT INGUINAL HERNIA WITH MESH;  Surgeon: Fanny Skates, MD;  Location: [facility];  Service: General;  Laterality: Right;  GENERAL AND TAP BLOCK  . INSERTION OF MESH Right 08/23/2017   Procedure: INSERTION OF MESH;  Surgeon: Fanny Skates, MD;  Location: [facility];  Service: General;  Laterality: Right;  GENERAL AND TAP BLOCK  . IR GASTROSTOMY TUBE MOD SED  02/06/2018  . IR [place] TUBE PERCUT W/FLUORO  04/10/2018  . KNEE SURGERY Left 1990's   tibia crushed, pinned and bone grafted  . LYMPH NODE BIOPSY    . MULTIPLE EXTRACTIONS WITH ALVEOLOPLASTY N/A 12/06/2014   Procedure: Extraction of tooth #'s 2,4,5,6,7,8,9,10,11,12,13,14,15, 20,22,26,27,28,29 with alveoloplasty.;  Surgeon: Lenn Cal, DDS;  Location: [facility];  Service: Oral Surgery;  Laterality: N/A;    Social History   Socioeconomic History  . Marital status: Married    Spouse name: Not on file  . Number of children: 1  . Years of education: Not on file  . Highest education level: Not on file  Occupational History  . Not on file  Social Needs  . Financial resource strain: Not on file  . Food insecurity:    Worry: Patient refused    Inability: Patient refused  . Transportation needs:    Medical: Patient refused    Non-medical: Patient refused  Tobacco Use  . Smoking status: Former Smoker    Packs/day: 1.00    Years: 25.00    Pack years: 25.00    Last attempt to quit: 07/12/1998    Years since quitting: 20.2  . Smokeless tobacco: Never Used  Substance and Sexual Activity  . Alcohol use: No  . Drug use: No  . Sexual activity: Not Currently  Lifestyle  . Physical activity:    Days per week: Patient refused    Minutes per session: Patient refused  . Stress: Not on file  Relationships  . Social connections:    Talks on phone: Patient refused    Gets together: Patient refused    Attends religious service: Patient refused    Active member of club or organization: Patient refused    Attends meetings of clubs or organizations: Patient refused    Relationship status: Patient refused  . Intimate partner violence:    Fear of current or ex partner: Patient refused    Emotionally abused: Patient refused    Physically abused: Patient refused    Forced sexual  activity: Patient refused  Other Topics Concern  . Not on file  Social History Narrative   Patient is married and had 1 child. 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    Allergies  Allergen Reactions  . Cefepime     Urticarial rash - unclear cause, had been receiving cefepime for several days and had also had flagyl as well  . Flagyl [Metronidazole]     Urticarial rash - unclear, was also receiving cefepime     Family History  Problem Relation Age of Onset  . Cancer Mother        throat ca  .  Cancer Father        lung ca  . Cancer Brother        throat ca  . Heart block Brother     Prior to Admission medications   Medication Sig Start Date End Date Taking? Authorizing Provider  acetaminophen (TYLENOL) 80 MG/0.8ML suspension Take 1,000 mg by mouth 3 (three) times daily as needed for fever.    [provider]  amLODipine (NORVASC) 10 MG tablet Take 1 tablet (10 mg total) by mouth daily. Patient taking differently: Place 10 mg into feeding tube daily.  02/03/18   Donzetta Starch, NP  artificial tears (LACRILUBE) OINT ophthalmic ointment Place into both eyes every 3 (three) hours as needed for dry eyes. 02/02/18   Donzetta Starch, NP  chlorhexidine (PERIDEX) 0.12 % solution 15 mLs by Mouth Rinse route 2 (two) times daily. 02/02/18   Donzetta Starch, NP  docusate (COLACE) 50 MG/5ML liquid Take by mouth daily. Give 10 mg ( 10 ml ) via peg tube daily for constipation    [provider]  glycopyrrolate (ROBINUL) 1 MG tablet Place 1 mg into feeding tube 2 (two) times daily.    [provider]  lisinopril (PRINIVIL,ZESTRIL) 20 MG tablet Take 1 tablet (20 mg total) by mouth daily. 02/02/18   Donzetta Starch, NP  metFORMIN (GLUCOPHAGE) 500 MG tablet Place 500 mg into feeding tube daily with breakfast.    [provider]  methylphenidate (RITALIN) 5 MG tablet Place 5 mg into feeding tube 2  (two) times daily.    [provider]  metoprolol tartrate (LOPRESSOR) 25 MG tablet Place 12.5 mg into feeding tube 2 (two) times daily.     [provider]  Multiple Vitamin (MULTIVITAMIN) LIQD Place 15 mLs into feeding tube daily.    [provider]  Nutritional Supplements (GLUCERNA 1.5 CAL PO) Take 237 mLs by mouth every 3 (three) hours while awake. 6 am, 9a, 12p, 3 pm, 6p , 9p       Free water flush 200 cc with each meal    [provider]  nystatin (MYCOSTATIN) 100000 UNIT/ML suspension Use as directed 5 mLs (500,000 Units total) in the mouth or throat 4 (four) times daily. 02/02/18   Donzetta Starch, NP  polyethylene glycol (MIRALAX / GLYCOLAX) packet Take 17 g by mouth daily. 02/03/18   Donzetta Starch, NP  tiZANidine (ZANAFLEX) 4 MG tablet Place 4 mg into feeding tube at bedtime.  12/12/17   [provider]    Physical Exam: Vitals:   10/23/18 1200 10/23/18 1215 10/23/18 1230 10/23/18 1245  BP: 139/81 140/74 (!) 143/99 125/78  Pulse: 96 96 96 90  Resp: (!) 23 15 (!) 24 17  Temp:      TempSrc:      SpO2: 97% 95% 98% 95%  Weight:      Height:         . General:  Appears calm and comfortable and is NAD . Eyes:   EOMI, normal lids, iris . ENT:  grossly normal hearing, lips & tongue, mmm . Neck:  no LAD, masses or thyromegaly . Cardiovascular:  RRR, no m/r/g. No LE edema.  Marland Kitchen Respiratory:   CTA bilaterally with no wheezes/rales/rhonchi.  Normal respiratory effort. . Abdomen:  soft, NT, ND, NABS; PEG tube in place . Skin:  no rash or induration seen on limited exam . Musculoskeletal: R hemiparesis . Psychiatric:  grossly normal mood and affect, speech unintelligible .  Neurologic: Unable to effectively perform    Radiological Exams on Admission: Dg Chest Port 1 View  Result Date: 10/23/2018 CLINICAL DATA:  Respiratory distress EXAM: PORTABLE CHEST 1 VIEW COMPARISON:  03/06/2018 FINDINGS: Normal heart size. Low lung volumes. Bibasilar  atelectasis. No pneumothorax or pleural effusion. IMPRESSION: Bibasilar atelectasis. Electronically Signed   By: Marybelle Killings M.D.   On: 10/23/2018 10:00    EKG: Independently reviewed.  Sinus tachcyardia with rate 118; nonspecific ST changes with no evidence of acute ischemia   Labs on Admission: I have personally reviewed the available labs and imaging studies at the time of the admission.  Pertinent labs:   Glucose 206 BUN 52/Creatinine 1.91/GFR 35; 59/1.52/45 in 8/19 BNP 87.9 Lactate 1.9 WBC 14.7; 15.0 in 8/19 Hgb 10.1; 12.1 in 8/19   Assessment/Plan Principal Problem:   Acute respiratory failure with hypoxia (HCC) Active Problems:   History of nasopharyngeal cancer   Type 2 diabetes mellitus, controlled, with renal complications (HCC)   Status post insertion of percutaneous endoscopic gastrostomy (PEG) tube (HCC)   Essential hypertension   Chronic kidney disease, stage III (moderate) (HCC)   Diabetes mellitus type 2 in nonobese (HCC)   Hemiparesis affecting right side as late effect of stroke (HCC)   Anemia of chronic disease   Copious oral secretions   Acute respiratory failure with hypoxia -Patient with h/o dysphagia and prior aspiration after CVA with persistently copious oral secretions presenting with apparent aspiration event -Patient eventually improved with suction and no longer needed oxygen supplementation by the time I saw him -CXR negative for infiltrate -No fever by history or on exam -Low suspicion for COVID -As such, patient does not appear to need acute hospitalization at this time and I recommend d/c to home with close ongoing monitoring at home -He does not appear to require antiibiotics at this time  H/o CVA -Persistent right-sided hemiparesis, dysphagia, dysarthria -Appears to be at/near baseline -Continue Robinul  H/o nasopharyngeal CA -No evidence of recurrence as of 4/19 nasolaryngoscopy -He is due for recurrent ENT f/u and should have this  as an outpatient  HTN -Continue Norvasc, Lisinopril, Lopressor  Stage 3 CKD -Appears to be stable to slightly worse than baseline -Consider stopping ACE/Glucophage if renal function continues to decline  DM -Continue Metformin for now, but consider d/c if renal function is worsening in the future  Anemia of chronic disease -Slightly worse than prior -Suggest outpatient monitoring -There is no current evidence of bleeding  Thank you for this consultation.  The patient appears to be stable for discharge at this time, but [facility] will be happy to re-consult on the patient should circumstances change.  Karmen Bongo MD [facility]   How to contact the [facility] Attending or Consulting provider [place] or covering provider during after hours [place], for this patient?  1. Check the care team in [facility] and look for a) attending/consulting [facility] provider listed and b) the [facility] team listed 2. Log into [email or web address] and use [place]'s universal password to access. If you do not have the password, please contact the hospital operator. 3. Locate the [facility] provider you are looking for under [facility] and page to a number that you can be directly reached. 4. If you still have difficulty reaching the provider, please page the [facility] (Director on Call) for the Hospitalists listed on amion for assistance.   10/23/2018, 2:15 PM

## 2018-10-23 NOTE — ED Notes (Signed)
Gastrostomy tube noted to LUQ, skin clean & dry around tube, tube clamped.

## 2018-10-23 NOTE — Discharge Instructions (Addendum)
We saw [name] for shortness of breath.  It appears that the shortness of breath was because of mucus plugging or aspiration.  Chest x-ray does not reveal pneumonia.  Over time his heart rate has improved and there has been no oxygen requirement.  He is also breathing a lot more comfortably.  Our recommendations are that [name] get close respiratory monitoring and aggressive suctioning.  If he starts developing fevers or has worsening shortness of breath then please send him back to the emergency room. PCP follow-up in 5 to 7 days as recommended.  Clinical concerns for COVID-19 is low however we are recommending anyone with respiratory symptoms to self quarantine for 7 days or until 3 days of symptoms resolving in its entirety.

## 2018-10-23 NOTE — ED Notes (Signed)
X-ray at bedside

## 2018-10-23 NOTE — ED Notes (Signed)
EDP at bedside, NRB mask removed, pt on RA.

## 2018-10-25 ENCOUNTER — Inpatient Hospital Stay (HOSPITAL_COMMUNITY)
Admission: EM | Admit: 2018-10-25 | Discharge: 2018-10-27 | DRG: 193 | Disposition: A | Discharge status: Home / Self Care | Payer: Medicare Other | Attending: Family Medicine | Admitting: Family Medicine

## 2018-10-25 ENCOUNTER — Emergency Department (HOSPITAL_COMMUNITY): Payer: Medicare Other

## 2018-10-25 ENCOUNTER — Other Ambulatory Visit: Payer: Self-pay

## 2018-10-25 DIAGNOSIS — D638 Anemia in other chronic diseases classified elsewhere: Secondary | ICD-10-CM | POA: Diagnosis present

## 2018-10-25 DIAGNOSIS — R6889 Other general symptoms and signs: Secondary | ICD-10-CM | POA: Diagnosis present

## 2018-10-25 DIAGNOSIS — N179 Acute kidney failure, unspecified: Secondary | ICD-10-CM | POA: Diagnosis present

## 2018-10-25 DIAGNOSIS — I69351 Hemiplegia and hemiparesis following cerebral infarction affecting right dominant side: Secondary | ICD-10-CM | POA: Diagnosis not present

## 2018-10-25 DIAGNOSIS — E440 Moderate protein-calorie malnutrition: Secondary | ICD-10-CM | POA: Diagnosis present

## 2018-10-25 DIAGNOSIS — R061 Stridor: Secondary | ICD-10-CM | POA: Diagnosis not present

## 2018-10-25 DIAGNOSIS — I69391 Dysphagia following cerebral infarction: Secondary | ICD-10-CM | POA: Diagnosis not present

## 2018-10-25 DIAGNOSIS — N183 Chronic kidney disease, stage 3 unspecified: Secondary | ICD-10-CM | POA: Diagnosis present

## 2018-10-25 DIAGNOSIS — J9601 Acute respiratory failure with hypoxia: Secondary | ICD-10-CM | POA: Diagnosis present

## 2018-10-25 DIAGNOSIS — Z931 Gastrostomy status: Secondary | ICD-10-CM | POA: Diagnosis not present

## 2018-10-25 DIAGNOSIS — Z9221 Personal history of antineoplastic chemotherapy: Secondary | ICD-10-CM

## 2018-10-25 DIAGNOSIS — Z20828 Contact with and (suspected) exposure to other viral communicable diseases: Secondary | ICD-10-CM | POA: Diagnosis present

## 2018-10-25 DIAGNOSIS — M109 Gout, unspecified: Secondary | ICD-10-CM | POA: Diagnosis present

## 2018-10-25 DIAGNOSIS — R1310 Dysphagia, unspecified: Secondary | ICD-10-CM | POA: Diagnosis present

## 2018-10-25 DIAGNOSIS — Z888 Allergy status to other drugs, medicaments and biological substances status: Secondary | ICD-10-CM

## 2018-10-25 DIAGNOSIS — E1122 Type 2 diabetes mellitus with diabetic chronic kidney disease: Secondary | ICD-10-CM | POA: Diagnosis present

## 2018-10-25 DIAGNOSIS — I129 Hypertensive chronic kidney disease with stage 1 through stage 4 chronic kidney disease, or unspecified chronic kidney disease: Secondary | ICD-10-CM | POA: Diagnosis present

## 2018-10-25 DIAGNOSIS — E785 Hyperlipidemia, unspecified: Secondary | ICD-10-CM | POA: Diagnosis present

## 2018-10-25 DIAGNOSIS — T17990A Other foreign object in respiratory tract, part unspecified in causing asphyxiation, initial encounter: Secondary | ICD-10-CM | POA: Diagnosis present

## 2018-10-25 DIAGNOSIS — X58XXXA Exposure to other specified factors, initial encounter: Secondary | ICD-10-CM | POA: Diagnosis present

## 2018-10-25 DIAGNOSIS — Z66 Do not resuscitate: Secondary | ICD-10-CM | POA: Diagnosis present

## 2018-10-25 DIAGNOSIS — J189 Pneumonia, unspecified organism: Secondary | ICD-10-CM | POA: Diagnosis present

## 2018-10-25 DIAGNOSIS — F419 Anxiety disorder, unspecified: Secondary | ICD-10-CM | POA: Diagnosis present

## 2018-10-25 DIAGNOSIS — Y9389 Activity, other specified: Secondary | ICD-10-CM | POA: Diagnosis not present

## 2018-10-25 DIAGNOSIS — G4731 Primary central sleep apnea: Secondary | ICD-10-CM | POA: Diagnosis present

## 2018-10-25 DIAGNOSIS — Z515 Encounter for palliative care: Secondary | ICD-10-CM

## 2018-10-25 DIAGNOSIS — Z6820 Body mass index (BMI) 20.0-20.9, adult: Secondary | ICD-10-CM

## 2018-10-25 DIAGNOSIS — I69322 Dysarthria following cerebral infarction: Secondary | ICD-10-CM | POA: Diagnosis not present

## 2018-10-25 DIAGNOSIS — Z881 Allergy status to other antibiotic agents status: Secondary | ICD-10-CM

## 2018-10-25 DIAGNOSIS — R0603 Acute respiratory distress: Secondary | ICD-10-CM | POA: Diagnosis not present

## 2018-10-25 DIAGNOSIS — K9423 Gastrostomy malfunction: Secondary | ICD-10-CM | POA: Diagnosis present

## 2018-10-25 DIAGNOSIS — Z85819 Personal history of malignant neoplasm of unspecified site of lip, oral cavity, and pharynx: Secondary | ICD-10-CM

## 2018-10-25 DIAGNOSIS — Y92238 Other place in hospital as the place of occurrence of the external cause: Secondary | ICD-10-CM | POA: Diagnosis present

## 2018-10-25 DIAGNOSIS — I69320 Aphasia following cerebral infarction: Secondary | ICD-10-CM

## 2018-10-25 DIAGNOSIS — E1129 Type 2 diabetes mellitus with other diabetic kidney complication: Secondary | ICD-10-CM | POA: Diagnosis present

## 2018-10-25 DIAGNOSIS — Z7984 Long term (current) use of oral hypoglycemic drugs: Secondary | ICD-10-CM

## 2018-10-25 DIAGNOSIS — Z85818 Personal history of malignant neoplasm of other sites of lip, oral cavity, and pharynx: Secondary | ICD-10-CM | POA: Diagnosis not present

## 2018-10-25 DIAGNOSIS — J181 Lobar pneumonia, unspecified organism: Secondary | ICD-10-CM | POA: Diagnosis not present

## 2018-10-25 DIAGNOSIS — Z923 Personal history of irradiation: Secondary | ICD-10-CM | POA: Diagnosis not present

## 2018-10-25 DIAGNOSIS — Z87891 Personal history of nicotine dependence: Secondary | ICD-10-CM

## 2018-10-25 DIAGNOSIS — Z79899 Other long term (current) drug therapy: Secondary | ICD-10-CM

## 2018-10-25 DIAGNOSIS — Z808 Family history of malignant neoplasm of other organs or systems: Secondary | ICD-10-CM

## 2018-10-25 DIAGNOSIS — Z801 Family history of malignant neoplasm of trachea, bronchus and lung: Secondary | ICD-10-CM

## 2018-10-25 LAB — COMPREHENSIVE METABOLIC PANEL
ALT: 16 U/L (ref 0–44)
AST: 29 U/L (ref 15–41)
Albumin: 3.8 g/dL (ref 3.5–5.0)
Alkaline Phosphatase: 72 U/L (ref 38–126)
Anion gap: 15 (ref 5–15)
BUN: 65 mg/dL — ABNORMAL HIGH (ref 8–23)
CO2: 24 mmol/L (ref 22–32)
Calcium: 9.3 mg/dL (ref 8.9–10.3)
Chloride: 98 mmol/L (ref 98–111)
Creatinine, Ser: 2.03 mg/dL — ABNORMAL HIGH (ref 0.61–1.24)
GFR calc Af Amer: 37 mL/min — ABNORMAL LOW (ref >60)
GFR calc non Af Amer: 32 mL/min — ABNORMAL LOW (ref >60)
Glucose, Bld: 296 mg/dL — ABNORMAL HIGH (ref 70–99)
Potassium: 4.5 mmol/L (ref 3.5–5.1)
Sodium: 137 mmol/L (ref 135–145)
Total Bilirubin: 0.4 mg/dL (ref 0.3–1.2)
Total Protein: 8.1 g/dL (ref 6.5–8.1)

## 2018-10-25 LAB — CREATININE, SERUM
Creatinine, Ser: 1.98 mg/dL — ABNORMAL HIGH (ref 0.61–1.24)
GFR calc Af Amer: 39 mL/min — ABNORMAL LOW (ref >60)
GFR calc non Af Amer: 33 mL/min — ABNORMAL LOW (ref >60)

## 2018-10-25 LAB — CBC WITH DIFFERENTIAL/PLATELET
Abs Immature Granulocytes: 0.2 10*3/uL — ABNORMAL HIGH (ref 0.00–0.07)
Basophils Absolute: 0.1 10*3/uL (ref 0.0–0.1)
Basophils Relative: 0 %
Eosinophils Absolute: 0.5 10*3/uL (ref 0.0–0.5)
Eosinophils Relative: 2 %
HCT: 33.6 % — ABNORMAL LOW (ref 39.0–52.0)
Hemoglobin: 10.4 g/dL — ABNORMAL LOW (ref 13.0–17.0)
Immature Granulocytes: 1 %
Lymphocytes Relative: 11 %
Lymphs Abs: 2 10*3/uL (ref 0.7–4.0)
MCH: 26.5 pg (ref 26.0–34.0)
MCHC: 31 g/dL (ref 30.0–36.0)
MCV: 85.7 fL (ref 80.0–100.0)
Monocytes Absolute: 1.3 10*3/uL — ABNORMAL HIGH (ref 0.1–1.0)
Monocytes Relative: 7 %
Neutro Abs: 14.8 10*3/uL — ABNORMAL HIGH (ref 1.7–7.7)
Neutrophils Relative %: 79 %
Platelets: 296 10*3/uL (ref 150–400)
RBC: 3.92 MIL/uL — ABNORMAL LOW (ref 4.22–5.81)
RDW: 16.5 % — ABNORMAL HIGH (ref 11.5–15.5)
WBC: 18.9 10*3/uL — ABNORMAL HIGH (ref 4.0–10.5)
nRBC: 0 % (ref 0.0–0.2)

## 2018-10-25 LAB — SARS CORONAVIRUS 2 BY RT PCR (HOSPITAL ORDER, PERFORMED IN ~~LOC~~ HOSPITAL LAB): SARS Coronavirus 2: NEGATIVE

## 2018-10-25 LAB — CBC
HCT: 31.5 % — ABNORMAL LOW (ref 39.0–52.0)
Hemoglobin: 9.8 g/dL — ABNORMAL LOW (ref 13.0–17.0)
MCH: 26.3 pg (ref 26.0–34.0)
MCHC: 31.1 g/dL (ref 30.0–36.0)
MCV: 84.7 fL (ref 80.0–100.0)
Platelets: 251 10*3/uL (ref 150–400)
RBC: 3.72 MIL/uL — ABNORMAL LOW (ref 4.22–5.81)
RDW: 16.4 % — ABNORMAL HIGH (ref 11.5–15.5)
WBC: 12.1 10*3/uL — ABNORMAL HIGH (ref 4.0–10.5)
nRBC: 0 % (ref 0.0–0.2)

## 2018-10-25 MED ORDER — MORPHINE SULFATE (PF) 4 MG/ML IV SOLN
4.0000 mg | Freq: Once | INTRAVENOUS | Status: AC
  Administered 2018-10-25: 13:00:00 4 mg via INTRAVENOUS

## 2018-10-25 MED ORDER — LEVOFLOXACIN IN D5W 750 MG/150ML IV SOLN
750.0000 mg | INTRAVENOUS | Status: DC
  Administered 2018-10-26: 750 mg via INTRAVENOUS
  Filled 2018-10-25: qty 150

## 2018-10-25 MED ORDER — ONDANSETRON HCL 4 MG/2ML IJ SOLN: 4.0000 mg | Freq: Four times a day (QID) | INTRAMUSCULAR | Status: DC | PRN

## 2018-10-25 MED ORDER — FREE WATER
500.0000 mL | Freq: Three times a day (TID) | Status: DC
  Administered 2018-10-26 – 2018-10-27 (×2): 500 mL

## 2018-10-25 MED ORDER — ACETAMINOPHEN 325 MG PO TABS: 650.0000 mg | ORAL_TABLET | Freq: Four times a day (QID) | ORAL | Status: DC | PRN

## 2018-10-25 MED ORDER — SODIUM CHLORIDE 0.9% FLUSH
3.0000 mL | Freq: Two times a day (BID) | INTRAVENOUS | Status: DC
  Administered 2018-10-25 – 2018-10-27 (×4): 3 mL via INTRAVENOUS

## 2018-10-25 MED ORDER — DIPHENHYDRAMINE HCL 50 MG/ML IJ SOLN
25.0000 mg | Freq: Four times a day (QID) | INTRAMUSCULAR | Status: DC | PRN
  Administered 2018-10-27: 25 mg via INTRAVENOUS
  Filled 2018-10-25: qty 1

## 2018-10-25 MED ORDER — SODIUM CHLORIDE 0.9 % IV SOLN
INTRAVENOUS | Status: DC
  Administered 2018-10-25: 19:00:00 via INTRAVENOUS

## 2018-10-25 MED ORDER — TIZANIDINE HCL 4 MG PO TABS
4.0000 mg | ORAL_TABLET | Freq: Every day | ORAL | Status: DC
  Administered 2018-10-25 – 2018-10-26 (×2): 4 mg
  Filled 2018-10-25 (×2): qty 1

## 2018-10-25 MED ORDER — ONDANSETRON HCL 4 MG/2ML IJ SOLN
INTRAMUSCULAR | Status: AC
  Filled 2018-10-25: qty 2

## 2018-10-25 MED ORDER — ENOXAPARIN SODIUM 40 MG/0.4ML ~~LOC~~ SOLN
40.0000 mg | SUBCUTANEOUS | Status: DC
  Administered 2018-10-25: 40 mg via SUBCUTANEOUS
  Filled 2018-10-25: qty 0.4

## 2018-10-25 MED ORDER — MORPHINE SULFATE (PF) 4 MG/ML IV SOLN
INTRAVENOUS | Status: AC
  Administered 2018-10-25: 4 mg via INTRAVENOUS
  Filled 2018-10-25: qty 1

## 2018-10-25 MED ORDER — ACETAMINOPHEN 650 MG RE SUPP: 650.0000 mg | Freq: Four times a day (QID) | RECTAL | Status: DC | PRN

## 2018-10-25 MED ORDER — AMLODIPINE BESYLATE 10 MG PO TABS
10.0000 mg | ORAL_TABLET | Freq: Every day | ORAL | Status: DC
  Filled 2018-10-25: qty 1

## 2018-10-25 MED ORDER — LEVOFLOXACIN IN D5W 500 MG/100ML IV SOLN
500.0000 mg | Freq: Once | INTRAVENOUS | Status: AC
  Administered 2018-10-25: 500 mg via INTRAVENOUS
  Filled 2018-10-25: qty 100

## 2018-10-25 MED ORDER — METHYLPHENIDATE HCL 5 MG PO TABS
5.0000 mg | ORAL_TABLET | Freq: Two times a day (BID) | ORAL | Status: DC
  Administered 2018-10-27 (×2): 5 mg
  Filled 2018-10-25 (×3): qty 1

## 2018-10-25 MED ORDER — SODIUM CHLORIDE 0.9 % IV BOLUS
500.0000 mL | Freq: Once | INTRAVENOUS | Status: AC
  Administered 2018-10-25: 500 mL via INTRAVENOUS

## 2018-10-25 MED ORDER — SODIUM CHLORIDE 0.9 % IV SOLN: 250.0000 mL | INTRAVENOUS | Status: DC | PRN

## 2018-10-25 MED ORDER — GUAIFENESIN 100 MG/5ML PO SOLN
5.0000 mL | Freq: Four times a day (QID) | ORAL | Status: DC
  Administered 2018-10-25 – 2018-10-27 (×6): 100 mg
  Filled 2018-10-25: qty 100
  Filled 2018-10-25 (×5): qty 10

## 2018-10-25 MED ORDER — ONDANSETRON HCL 4 MG PO TABS: 4.0000 mg | ORAL_TABLET | Freq: Four times a day (QID) | ORAL | Status: DC | PRN

## 2018-10-25 MED ORDER — SODIUM CHLORIDE 0.9% FLUSH: 3.0000 mL | INTRAVENOUS | Status: DC | PRN

## 2018-10-25 MED ORDER — GLUCERNA 1.2 CAL PO LIQD
474.0000 mL | Freq: Three times a day (TID) | ORAL | Status: DC
  Administered 2018-10-26 – 2018-10-27 (×2): 474 mL
  Filled 2018-10-25 (×6): qty 474

## 2018-10-25 NOTE — ED Triage Notes (Addendum)
Pt coming from home respiratory distress, per wife pt is suffering from nasopharyngeal cancer cancer and occassionally needs deep suction. This morning wife could not get mucus plug from airway and called EMS. 11.2 racemic-epi and 125mg  solu-med given PTA by EMS. Pt arrives with NRB at 15L-[place] with respirations labored and tacypnic. Pt does have a history of stroke and is non-verbal and flaccid to R side. G tube was dislodged with EMS PTA. Pt is a DNR.

## 2018-10-25 NOTE — ED Notes (Signed)
ED TO INPATIENT HANDOFF REPORT  ED Nurse Name and Phone #: Ilean China, RN [phone]  S Name/Age/Gender Adam Yang 73 y.o. male Room/Bed: WA18/WA18  Code Status   Code Status: DNR  Home/SNF/Other Home Patient oriented to: self, place, time and situation Is this baseline? Yes   Triage Complete: Triage complete  Chief Complaint respiratory distress  Triage Note Pt coming from home respiratory distress, per wife pt is suffering from nasopharyngeal cancer cancer and occassionally needs deep suction. This morning wife could not get mucus plug from airway and called EMS. 11.2 racemic-epi and 125mg  solu-med given PTA by EMS. Pt arrives with NRB at 15L-[place] with respirations labored and tacypnic. Pt does have a history of stroke and is non-verbal and flaccid to R side. G tube was dislodged with EMS PTA. Pt is a DNR.      Allergies Allergies  Allergen Reactions  . Cefepime     Urticarial rash - unclear cause, had been receiving cefepime for several days and had also had flagyl as well  . Flagyl [Metronidazole]     Urticarial rash - unclear, was also receiving cefepime     Level of Care/Admitting Diagnosis ED Disposition    ED Disposition Condition [facility] Area: [place] [[id number]]  Level of Care: Med-Surg [16]  Diagnosis: Pneumonia [824235]  Admitting Physician: [place] [place]  Attending Physician: Debbe Odea [[id number]]  Estimated length of stay: past midnight tomorrow  Certification:: I certify this patient will need inpatient services for at least 2 midnights  Possible Covid Disease Patient Isolation: Low Risk  (Less than 4L [place] supplementation)  PT Class (Do Not Modify): Inpatient [101]  PT Acc Code (Do Not Modify): Private [1]       B Medical/Surgery History Past Medical History:  Diagnosis Date  . Anxiety    Claustrophobia  . Arthritis   . Cancer (HCC)    nasopharynx  . Chronic cough    EXCESS MUCUS BUILDUP SINCE  ORAL SURGERY FROM CANCER   . Chronic kidney disease    Renal Insuffiency , Creatinine has been in the 2- 3 range, 11/27/14 Creatine 1.4  . Diabetes mellitus without complication ([place])    Type II  . Gout   . History of radiation therapy 12/23/2014- 02/10/2015   Nasopharynx / Neck Adenopathy  . Hyperlipidemia   . Hypertension   . Inguinal hernia   . Right inguinal hernia 08/23/2017   Past Surgical History:  Procedure Laterality Date  . colonoscopy    . COLONOSCOPY W/ POLYPECTOMY    . INGUINAL HERNIA REPAIR Right 08/23/2017   Procedure: OPEN REPAIR RIGHT INGUINAL HERNIA WITH MESH;  Surgeon: Fanny Skates, MD;  [facility];  Service: General;  Laterality: Right;  GENERAL AND TAP BLOCK  . INSERTION OF MESH Right 08/23/2017   Procedure: INSERTION OF MESH;  Surgeon: Fanny Skates, MD;  [facility];  Service: General;  Laterality: Right;  GENERAL AND TAP BLOCK  . IR GASTROSTOMY TUBE MOD SED  02/06/2018  . IR [place] TUBE PERCUT W/FLUORO  04/10/2018  . KNEE SURGERY Left 1990's   tibia crushed, pinned and bone grafted  . LYMPH NODE BIOPSY    . MULTIPLE EXTRACTIONS WITH ALVEOLOPLASTY N/A 12/06/2014   Procedure: Extraction of tooth #'s 2,4,5,6,7,8,9,10,11,12,13,14,15, 20,22,26,27,28,29 with alveoloplasty.;  Surgeon: Lenn Cal, DDS;  Location: [facility];  Service: Oral Surgery;  Laterality: N/A;     A IV Location/Drains/Wounds Patient Lines/Drains/Airways Status   Active  Line/Drains/Airways    Name:   Placement date:   Placement time:   Site:   Days:   Peripheral IV 10/25/18 Left Wrist   10/25/18    1246    Wrist   less than 1   Gastrostomy/Enterostomy Gastrostomy 20 Fr. LUQ   04/10/18    1353    LUQ   198   Incision (Closed) 08/23/17 Abdomen Other (Comment)   08/23/17    0822     428   Wound / Incision (Open or Dehisced) 02/05/18 Sacrum Medial Non blanchable redness and soreness at the sacrum area   02/05/18    1301    Sacrum   262          Intake/Output Last 24  hours No intake or output data in the 24 hours ending 10/25/18 1756  Labs/Imaging Results for orders placed or performed during the hospital encounter of 10/25/18 (from the past 48 hour(s))  CBC with Differential/Platelet     Status: Abnormal   Collection Time: 10/25/18 12:56 PM  Result Value Ref Range   WBC 18.9 (H) 4.0 - 10.5 K/uL   RBC 3.92 (L) 4.22 - 5.81 MIL/uL   Hemoglobin 10.4 (L) 13.0 - 17.0 g/dL   HCT 33.6 (L) 39.0 - 52.0 %   MCV 85.7 80.0 - 100.0 fL   MCH 26.5 26.0 - 34.0 pg   MCHC 31.0 30.0 - 36.0 g/dL   RDW 16.5 (H) 11.5 - 15.5 %   Platelets 296 150 - 400 K/uL   nRBC 0.0 0.0 - 0.2 %   Neutrophils Relative % 79 %   Neutro Abs 14.8 (H) 1.7 - 7.7 K/uL   Lymphocytes Relative 11 %   Lymphs Abs 2.0 0.7 - 4.0 K/uL   Monocytes Relative 7 %   Monocytes Absolute 1.3 (H) 0.1 - 1.0 K/uL   Eosinophils Relative 2 %   Eosinophils Absolute 0.5 0.0 - 0.5 K/uL   Basophils Relative 0 %   Basophils Absolute 0.1 0.0 - 0.1 K/uL   Immature Granulocytes 1 %   Abs Immature Granulocytes 0.20 (H) 0.00 - 0.07 K/uL    Comment: Performed at [facility], [place] [address], [place], [place] [address]  Comprehensive metabolic panel     Status: Abnormal   Collection Time: 10/25/18 12:56 PM  Result Value Ref Range   Sodium 137 135 - 145 mmol/L   Potassium 4.5 3.5 - 5.1 mmol/L   Chloride 98 98 - 111 mmol/L   CO2 24 22 - 32 mmol/L   Glucose, Bld 296 (H) 70 - 99 mg/dL   BUN 65 (H) 8 - 23 mg/dL   Creatinine, Ser 2.03 (H) 0.61 - 1.24 mg/dL   Calcium 9.3 8.9 - 10.3 mg/dL   Total Protein 8.1 6.5 - 8.1 g/dL   Albumin 3.8 3.5 - 5.0 g/dL   AST 29 15 - 41 U/L   ALT 16 0 - 44 U/L   Alkaline Phosphatase 72 38 - 126 U/L   Total Bilirubin 0.4 0.3 - 1.2 mg/dL   GFR calc non Af Amer 32 (L) >60 mL/min   GFR calc Af Amer 37 (L) >60 mL/min   Anion gap 15 5 - 15    Comment: Performed at [facility], [place] [address], [place], [place] [address]   Dg Chest Port 1  View  Result Date: 10/25/2018 CLINICAL DATA:  Shortness of breath EXAM: PORTABLE CHEST 1 VIEW COMPARISON:  10/23/2010 FINDINGS: Bilateral mild interstitial thickening.  Bibasilar airspace disease likely reflecting atelectasis. No significant pleural effusions. No pneumothorax. Stable cardiomediastinal silhouette. No aggressive osseous lesion. IMPRESSION: Mild bilateral interstitial thickening likely reflecting mild interstitial edema. Bibasilar atelectasis. Electronically Signed   By: Kathreen Devoid   On: 10/25/2018 13:41    Pending Labs Unresulted Labs (From admission, onward)    Start     Ordered   11/01/18 0500  Creatinine, serum  (enoxaparin (LOVENOX)    CrCl >/= 30 ml/min)  Weekly,   R    Comments:  while on enoxaparin therapy    10/25/18 1708   10/25/18 1704  CBC  (enoxaparin (LOVENOX)    CrCl >/= 30 ml/min)  Once,   R    Comments:  Baseline for enoxaparin therapy IF NOT ALREADY DRAWN.  Notify MD if PLT < 100 K.    10/25/18 1708   10/25/18 1704  Creatinine, serum  (enoxaparin (LOVENOX)    CrCl >/= 30 ml/min)  Once,   R    Comments:  Baseline for enoxaparin therapy IF NOT ALREADY DRAWN.    10/25/18 1708   10/25/18 1612  SARS Coronavirus 2 [facility] order, Performed in [place] hospital lab)  (Novel Coronavirus, [facility] Order) with precautions panel)  Once,   R     10/25/18 1611          Vitals/Pain Today's Vitals   10/25/18 1600 10/25/18 1630 10/25/18 1700 10/25/18 1730  BP: (!) 151/65 (!) 151/71 121/77 113/68  Pulse: 99 99 95 98  Resp: 20 (!) 24 (!) 21 (!) 21  Temp:      TempSrc:      SpO2: 96% 96% 94% 96%  Weight:      Height:        Isolation Precautions Droplet and Contact precautions  Medications Medications  ondansetron (ZOFRAN) 4 MG/2ML injection (  Not Given 10/25/18 1253)  feeding supplement (GLUCERNA 1.2 CAL) liquid 474 mL (has no administration in time range)  free water 500 mL (has no administration in time range)  enoxaparin (LOVENOX) injection  40 mg (has no administration in time range)  sodium chloride flush (NS) 0.9 % injection 3 mL (has no administration in time range)  sodium chloride flush (NS) 0.9 % injection 3 mL (has no administration in time range)  0.9 %  sodium chloride infusion (has no administration in time range)  0.9 %  sodium chloride infusion (has no administration in time range)  diphenhydrAMINE (BENADRYL) injection 25 mg (has no administration in time range)  acetaminophen (TYLENOL) tablet 650 mg (has no administration in time range)    Or  acetaminophen (TYLENOL) suppository 650 mg (has no administration in time range)  ondansetron (ZOFRAN) tablet 4 mg (has no administration in time range)    Or  ondansetron (ZOFRAN) injection 4 mg (has no administration in time range)  amLODipine (NORVASC) tablet 10 mg (has no administration in time range)  methylphenidate (RITALIN) tablet 5 mg (has no administration in time range)  tiZANidine (ZANAFLEX) tablet 4 mg (has no administration in time range)  guaiFENesin (ROBITUSSIN) 100 MG/5ML solution 100 mg (has no administration in time range)  levofloxacin (LEVAQUIN) IVPB 500 mg (has no administration in time range)  morphine 4 MG/ML injection 4 mg (4 mg Intravenous Given 10/25/18 1252)  levofloxacin (LEVAQUIN) IVPB 500 mg (500 mg Intravenous New Bag/Given 10/25/18 1632)  sodium chloride 0.9 % bolus 500 mL (500 mLs Intravenous New Bag/Given 10/25/18 1631)    Mobility non-ambulatory High fall risk   Focused  Assessments Pulmonary Assessment Handoff:  Lung sounds: Bilateral Breath Sounds: [place] O2 Device: Room Air O2 Flow Rate (L/min): 15 L/min      R Recommendations: See Admitting Provider Note  Report given to:   Additional Notes:

## 2018-10-25 NOTE — H&P (Addendum)
History and Physical    Adam Yang  SFK:[id number]  DOB: [birth year]  DOA: 10/25/2018 PCP: Lawerance Cruel, MD   Patient coming from: home  Chief Complaint: cough, dyspnea  HPI: Adam Yang is a 73 y.o. male with medical history of nasopharyngael cancer s/p radiation, HTN, DM, hemorrhagic CVA and PEG tube. He came in to the ED on 4/13 for respiratory distress but improved after suctioning and was discharged home. CXR was negative and he was not hypoxic.   He returns today (sent by wife via EMS) became suddenly cyanotic after coughing.  He remained cyanotic until EMS arrived and he was suctioned by them and brought in to the ED. He arrived to the ED on an non-rebreather and has been weaned down to room air   ED Course: increasing WBC count noted with mild rise in Cr noted.   Review of Systems:  Cannot obtain.  Past Medical History:  Diagnosis Date  . Anxiety    Claustrophobia  . Arthritis   . Cancer (HCC)    nasopharynx  . Chronic cough    EXCESS MUCUS BUILDUP SINCE ORAL SURGERY FROM CANCER   . Chronic kidney disease    Renal Insuffiency , Creatinine has been in the 2- 3 range, 11/27/14 Creatine 1.4  . Diabetes mellitus without complication ([place])    Type II  . Gout   . History of radiation therapy 12/23/2014- 02/10/2015   Nasopharynx / Neck Adenopathy  . Hyperlipidemia   . Hypertension   . Inguinal hernia   . Right inguinal hernia 08/23/2017    Past Surgical History:  Procedure Laterality Date  . colonoscopy    . COLONOSCOPY W/ POLYPECTOMY    . INGUINAL HERNIA REPAIR Right 08/23/2017   Procedure: OPEN REPAIR RIGHT INGUINAL HERNIA WITH MESH;  Surgeon: Fanny Skates, MD;  Location: [facility];  Service: General;  Laterality: Right;  GENERAL AND TAP BLOCK  . INSERTION OF MESH Right 08/23/2017   Procedure: INSERTION OF MESH;  Surgeon: Fanny Skates, MD;  Location: [facility];  Service: General;  Laterality: Right;  GENERAL AND TAP BLOCK  . IR GASTROSTOMY TUBE MOD SED   02/06/2018  . IR [place] TUBE PERCUT W/FLUORO  04/10/2018  . KNEE SURGERY Left 1990's   tibia crushed, pinned and bone grafted  . LYMPH NODE BIOPSY    . MULTIPLE EXTRACTIONS WITH ALVEOLOPLASTY N/A 12/06/2014   Procedure: Extraction of tooth #'s 2,4,5,6,7,8,9,10,11,12,13,14,15, 20,22,26,27,28,29 with alveoloplasty.;  Surgeon: Lenn Cal, DDS;  Location: [facility];  Service: Oral Surgery;  Laterality: N/A;    Social History:   reports that he quit smoking about 20 years ago. He has a 25.00 pack-year smoking history. He has never used smokeless tobacco. He reports that he does not drink alcohol or use drugs.  Allergies  Allergen Reactions  . Cefepime     Urticarial rash - unclear cause, had been receiving cefepime for several days and had also had flagyl as well  . Flagyl [Metronidazole]     Urticarial rash - unclear, was also receiving cefepime     Family History  Problem Relation Age of Onset  . Cancer Mother        throat ca  . Cancer Father        lung ca  . Cancer Brother        throat ca  . Heart block Brother      Prior to Admission medications   Medication Sig Start Date End Date Taking? Authorizing Provider  amLODipine (NORVASC) 5 MG tablet Place 10 mg into feeding tube daily.  09/28/18  Yes [provider]  Melatonin 5 MG TABS Place 5 mg into feeding tube at bedtime.   Yes [provider]  methylphenidate (RITALIN) 5 MG tablet Place 5 mg into feeding tube 2 (two) times daily.   Yes [provider]  Nutritional Supplements (GLUCERNA 1.5 CAL PO) Place 474 mLs into feeding tube 3 (three) times daily.    Yes [provider]  tiZANidine (ZANAFLEX) 4 MG tablet Place 4 mg into feeding tube at bedtime.  12/12/17  Yes [provider]  amLODipine (NORVASC) 10 MG tablet Take 1 tablet (10 mg total) by mouth daily. Patient not taking: Reported on 10/25/2018 02/03/18   Donzetta Starch, NP  artificial tears (LACRILUBE) OINT  ophthalmic ointment Place into both eyes every 3 (three) hours as needed for dry eyes. Patient not taking: Reported on 10/25/2018 02/02/18   Donzetta Starch, NP  chlorhexidine (PERIDEX) 0.12 % solution 15 mLs by Mouth Rinse route 2 (two) times daily. Patient not taking: Reported on 10/25/2018 02/02/18   Donzetta Starch, NP  lisinopril (PRINIVIL,ZESTRIL) 20 MG tablet Take 1 tablet (20 mg total) by mouth daily. Patient not taking: Reported on 10/25/2018 02/02/18   Donzetta Starch, NP  nystatin (MYCOSTATIN) 100000 UNIT/ML suspension Use as directed 5 mLs (500,000 Units total) in the mouth or throat 4 (four) times daily. Patient not taking: Reported on 10/25/2018 02/02/18   Donzetta Starch, NP  polyethylene glycol (MIRALAX / GLYCOLAX) packet Take 17 g by mouth daily. Patient not taking: Reported on 10/25/2018 02/03/18   Donzetta Starch, NP    Physical Exam: Wt Readings from Last 3 Encounters:  10/25/18 72.6 kg  10/23/18 70.3 kg  04/20/18 57.8 kg   Vitals:   10/25/18 1430 10/25/18 1500 10/25/18 1530 10/25/18 1600  BP: 134/73 (!) 142/70 134/70 (!) 151/65  Pulse: 98 99 95 99  Resp: (!) 21 20 14 20   Temp:      TempSrc:      SpO2: 96% 92% 96% 96%  Weight:      Height:          Constitutional:  Calm & comfortable Eyes: PERRLA, lids and conjunctivae normal ENT:  Mucous membranes are moist.  Pharynx clear of exudate   Normal dentition.  Neck: Supple, no masses  Respiratory:  Clear to auscultation bilaterally  Normal respiratory effort.  Cardiovascular:  S1 & S2 heard, regular rate and rhythm No Murmurs Abdomen:  Non distended No tenderness, No masses Bowel sounds normal Extremities:  No clubbing / cyanosis No pedal edema No joint deformity    Skin:  No rashes, lesions or ulcers Neurologic:  AAO x 3 CN 2-12 grossly intact Sensation intact Strength 5/5 in all 4 extremities Psychiatric:  Normal Mood and affect    Labs on Admission: I have personally reviewed following labs and  imaging studies  CBC: Recent Labs  Lab 10/23/18 0846 10/25/18 1256  WBC 14.7* 18.9*  NEUTROABS 12.3* 14.8*  HGB 10.1* 10.4*  HCT 33.4* 33.6*  MCV 83.5 85.7  PLT 249 109   Basic Metabolic Panel: Recent Labs  Lab 10/23/18 0846 10/25/18 1256  NA 137 137  K 4.1 4.5  CL 100 98  CO2 24 24  GLUCOSE 206* 296*  BUN 52* 65*  CREATININE 1.91* 2.03*  CALCIUM 9.6 9.3   GFR: Estimated Creatinine Clearance: 34.8 mL/min (A) (by C-G formula based on SCr of  2.03 mg/dL (H)). Liver Function Tests: Recent Labs  Lab 10/23/18 0846 10/25/18 1256  AST 18 29  ALT 17 16  ALKPHOS 73 72  BILITOT 0.6 0.4  PROT 7.4 8.1  ALBUMIN 3.4* 3.8   No results for input(s): LIPASE, AMYLASE in the last 168 hours. No results for input(s): AMMONIA in the last 168 hours. Coagulation Profile: No results for input(s): INR, PROTIME in the last 168 hours. Cardiac Enzymes: No results for input(s): CKTOTAL, CKMB, CKMBINDEX, TROPONINI in the last 168 hours. BNP (last 3 results) No results for input(s): PROBNP in the last 8760 hours. HbA1C: No results for input(s): HGBA1C in the last 72 hours. CBG: No results for input(s): GLUCAP in the last 168 hours. Lipid Profile: No results for input(s): CHOL, HDL, LDLCALC, TRIG, CHOLHDL, LDLDIRECT in the last 72 hours. Thyroid Function Tests: No results for input(s): TSH, T4TOTAL, FREET4, T3FREE, THYROIDAB in the last 72 hours. Anemia Panel: No results for input(s): VITAMINB12, FOLATE, FERRITIN, TIBC, IRON, RETICCTPCT in the last 72 hours. Urine analysis:    Component Value Date/Time   COLORURINE STRAW (A) 01/26/2018 1227   APPEARANCEUR CLEAR 01/26/2018 1227   LABSPEC 1.018 01/26/2018 1227   PHURINE 7.0 01/26/2018 1227   GLUCOSEU 150 (A) 01/26/2018 1227   HGBUR SMALL (A) 01/26/2018 1227   BILIRUBINUR NEGATIVE 01/26/2018 1227   KETONESUR NEGATIVE 01/26/2018 1227   PROTEINUR 100 (A) 01/26/2018 1227   NITRITE NEGATIVE 01/26/2018 1227   LEUKOCYTESUR NEGATIVE  01/26/2018 1227   Sepsis Labs: @LABRCNTIP (procalcitonin:4,lacticidven:4) ) Recent Results (from the past 240 hour(s))  Blood Culture (routine x 2)     Status: None (Preliminary result)   Collection Time: 10/23/18  9:09 AM  Result Value Ref Range Status   Specimen Description BLOOD LEFT WRIST  Final   Special Requests   Final    BOTTLES DRAWN AEROBIC AND ANAEROBIC Blood Culture adequate volume   Culture   Final    NO GROWTH 2 DAYS Performed at [facility], [place] [address], [place], [place] [address]    Report Status PENDING  Incomplete  Blood Culture (routine x 2)     Status: None (Preliminary result)   Collection Time: 10/23/18  9:09 AM  Result Value Ref Range Status   Specimen Description BLOOD LEFT ANTECUBITAL  Final   Special Requests   Final    BOTTLES DRAWN AEROBIC AND ANAEROBIC Blood Culture results may not be optimal due to an excessive volume of blood received in culture bottles   Culture   Final    NO GROWTH 2 DAYS Performed at [facility], [place] [address] [place], [place] [address]    Report Status PENDING  Incomplete     Radiological Exams on Admission: Dg Chest Port 1 View  Result Date: 10/25/2018 CLINICAL DATA:  Shortness of breath EXAM: PORTABLE CHEST 1 VIEW COMPARISON:  10/23/2010 FINDINGS: Bilateral mild interstitial thickening. Bibasilar airspace disease likely reflecting atelectasis. No significant pleural effusions. No pneumothorax. Stable cardiomediastinal silhouette. No aggressive osseous lesion. IMPRESSION: Mild bilateral interstitial thickening likely reflecting mild interstitial edema. Bibasilar atelectasis. Electronically Signed   By: Kathreen Devoid   On: 10/25/2018 13:41      Assessment/Plan Active Problems: Cough, leukocytosis - Copious oral secretions - WBC count has risen from 14 to 18 over a few days - cont oropharyngeal suction due to tendency to have excess secretions in the pharynx  - acute bronchitis vs pneumonia- CXR does not  show any clear cut infiltrates - COVID  19 test ordered by ED - started on Levaquin as he had an allergic reaction in the past when both Cefepime and Flagyl were infusion (he was on Cefepime for a few days but he had hives after the Flagyl was started) - he is currently not needing any O2     History of nasopharyngeal cancer   - in remission- treated by Dr Alvy Bimler  HTN - hold Lisiopril until renal function improves    Type 2 diabetes mellitus, controlled, with renal complications  - per wife his A1 5.1 - not on medication    Status post insertion of percutaneous endoscopic gastrostomy (PEG) tube - placed after his CVA - takes Glucerna TID 474 cc- have resumed this - his feeding tube is not functioning properly and the cap is loose - will consult IR to try to fix this    AKI on Chronic kidney disease, stage III (moderate) - Cr is higher than usual - will give slow IVF for 10 hrs and recheck tomorrow  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

## 2018-10-25 NOTE — ED Notes (Signed)
Verbal order from [facility], MD for 4mg  morphine IV at this time.

## 2018-10-25 NOTE — ED Provider Notes (Signed)
[facility] DEPT Provider Note   CSN: [id number] Arrival date & time: 10/25/18  1233    History   Chief Complaint Chief Complaint  Patient presents with  . Respiratory Distress    HPI Adam Yang is a 73 y.o. male.     HPI Patient with history of oropharyngeal cancer status post radiation and hemorrhagic stroke with residual right-sided weakness.  Presents with acute difficulty breathing.  Patient has excess mucus buildup and requires frequent suctioning.  EMS was called and patient was found to be in respiratory distress with stridorous respirations.  Placed on nonrebreather and given receiving epinephrine and 125 mg of Solu-Medrol.  Patient has a DNR.  Patient has history of aphasia.  Is unable to contribute to history.  Level 5 caveat applies. Past Medical History:  Diagnosis Date  . Anxiety    Claustrophobia  . Arthritis   . Cancer (HCC)    nasopharynx  . Chronic cough    EXCESS MUCUS BUILDUP SINCE ORAL SURGERY FROM CANCER   . Chronic kidney disease    Renal Insuffiency , Creatinine has been in the 2- 3 range, 11/27/14 Creatine 1.4  . Diabetes mellitus without complication ([place])    Type II  . Gout   . History of radiation therapy 12/23/2014- 02/10/2015   Nasopharynx / Neck Adenopathy  . Hyperlipidemia   . Hypertension   . Inguinal hernia   . Right inguinal hernia 08/23/2017    Patient Active Problem List   Diagnosis Date Noted  . 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 Emphysema of lung ([place]) 03/22/2018  . CVA (cerebrovascular accident due to intracerebral hemorrhage) ([place]) 03/05/2018  . Acute respiratory failure with hypoxia ([place]) 03/05/2018  . Hypotension 03/05/2018  . Hyperlipidemia associated with type 2 diabetes mellitus ([place]) 03/05/2018  . Copious oral secretions   . Goals of care, counseling/discussion   . Palliative care by specialist   . Malnutrition of moderate degree 02/24/2018  .  Pneumonia of both lungs due to infectious organism 02/23/2018  . Pneumonia due to infectious organism 02/23/2018  . Aspiration pneumonia ([place]) 02/22/2018  . Aspiration pneumonia of right lower lobe due to milk ([place])   . Dysphasia   . Anemia of chronic disease   . Hypernatremia   . Transaminitis   . Hypertensive emergency 02/02/2018  . Intraparenchymal hemorrhage of brain ([place]) 02/02/2018  . Hemiparesis affecting right side as late effect of stroke ([place])   . Aphasia, post-stroke   . Dysphagia, post-stroke   . Nontraumatic subcortical hemorrhage of left cerebral hemisphere ([place])   . Diabetes mellitus type 2 in nonobese (HCC)   . AKI (acute kidney injury) ([place])   . Hyponatremia   . Chronic kidney disease   . Cytotoxic brain edema ([place]) 01/28/2018  . Brain herniation ([place]) 01/28/2018  . IVH (intraventricular hemorrhage) ([place]) 01/27/2018  . Right inguinal hernia 08/23/2017  . Chronic kidney disease, stage III (moderate) ([place]) 02/18/2017  . 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 Other fatigue 06/18/2016  . Essential hypertension 10/17/2015  . Constipation 02/06/2015  . Drug-induced skin rash 02/03/2015  . Mucositis due to chemotherapy 01/24/2015  . Hyperglycemia due to type 2 diabetes mellitus ([place]) 01/07/2015  . Chronic anxiety 12/19/2014  . Stage 2 chronic renal impairment associated with type 2 diabetes mellitus ([place]) 12/19/2014  . Type 2 diabetes mellitus, controlled, with renal complications ([place]) 42/70/6237  . Status post insertion of percutaneous endoscopic  gastrostomy (PEG) tube ([place]) 12/19/2014  . History of nasopharyngeal cancer 11/26/2014    Past Surgical History:  Procedure Laterality Date  . colonoscopy    . COLONOSCOPY W/ POLYPECTOMY    . INGUINAL HERNIA REPAIR Right 08/23/2017   Procedure: OPEN REPAIR RIGHT INGUINAL HERNIA WITH MESH;  Surgeon: Fanny Skates, MD;  Location: [facility];  Service: General;  Laterality:  Right;  GENERAL AND TAP BLOCK  . INSERTION OF MESH Right 08/23/2017   Procedure: INSERTION OF MESH;  Surgeon: Fanny Skates, MD;  Location: [facility];  Service: General;  Laterality: Right;  GENERAL AND TAP BLOCK  . IR GASTROSTOMY TUBE MOD SED  02/06/2018  . IR [place] TUBE PERCUT W/FLUORO  04/10/2018  . KNEE SURGERY Left 1990's   tibia crushed, pinned and bone grafted  . LYMPH NODE BIOPSY    . MULTIPLE EXTRACTIONS WITH ALVEOLOPLASTY N/A 12/06/2014   Procedure: Extraction of tooth #'s 2,4,5,6,7,8,9,10,11,12,13,14,15, 20,22,26,27,28,29 with alveoloplasty.;  Surgeon: Lenn Cal, DDS;  Location: [facility];  Service: Oral Surgery;  Laterality: N/A;        Home Medications    Prior to Admission medications   Medication Sig Start Date End Date Taking? Authorizing Provider  amLODipine (NORVASC) 5 MG tablet Place 10 mg into feeding tube daily.  09/28/18  Yes [provider]  Melatonin 5 MG TABS Place 5 mg into feeding tube at bedtime.   Yes [provider]  methylphenidate (RITALIN) 5 MG tablet Place 5 mg into feeding tube 2 (two) times daily.   Yes [provider]  Nutritional Supplements (GLUCERNA 1.5 CAL PO) Place 474 mLs into feeding tube 3 (three) times daily.    Yes [provider]  tiZANidine (ZANAFLEX) 4 MG tablet Place 4 mg into feeding tube at bedtime.  12/12/17  Yes [provider]  amLODipine (NORVASC) 10 MG tablet Take 1 tablet (10 mg total) by mouth daily. Patient not taking: Reported on 10/25/2018 02/03/18   Donzetta Starch, NP  artificial tears (LACRILUBE) OINT ophthalmic ointment Place into both eyes every 3 (three) hours as needed for dry eyes. Patient not taking: Reported on 10/25/2018 02/02/18   Donzetta Starch, NP  chlorhexidine (PERIDEX) 0.12 % solution 15 mLs by Mouth Rinse route 2 (two) times daily. Patient not taking: Reported on 10/25/2018 02/02/18   Donzetta Starch, NP  lisinopril (PRINIVIL,ZESTRIL) 20 MG tablet Take 1  tablet (20 mg total) by mouth daily. Patient not taking: Reported on 10/25/2018 02/02/18   Donzetta Starch, NP  nystatin (MYCOSTATIN) 100000 UNIT/ML suspension Use as directed 5 mLs (500,000 Units total) in the mouth or throat 4 (four) times daily. Patient not taking: Reported on 10/25/2018 02/02/18   Donzetta Starch, NP  polyethylene glycol (MIRALAX / GLYCOLAX) packet Take 17 g by mouth daily. Patient not taking: Reported on 10/25/2018 02/03/18   Donzetta Starch, NP    Family History Family History  Problem Relation Age of Onset  . Cancer Mother        throat ca  . Cancer Father        lung ca  . Cancer Brother        throat ca  . Heart block Brother     Social History Social History   Tobacco Use  . Smoking status: Former Smoker    Packs/day: 1.00    Years: 25.00    Pack years: 25.00    Last attempt to quit: 07/12/1998    Years since quitting:  20.3  . Smokeless tobacco: Never Used  Substance Use Topics  . Alcohol use: No  . Drug use: No     Allergies   Cefepime and Flagyl [metronidazole]   Review of Systems Review of Systems  Unable to perform ROS: Patient nonverbal     Physical Exam Updated Vital Signs BP (!) 151/65   Pulse 99   Temp 98 F (36.7 C) (Axillary)   Resp 20   Ht 6' (1.829 m)   Wt 72.6 kg   SpO2 96%   BMI 21.70 kg/m   Physical Exam Vitals signs and nursing note reviewed.  Constitutional:      General: He is in acute distress.     Appearance: He is well-developed.  HENT:     Head: Normocephalic and atraumatic.  Eyes:     Pupils: Pupils are equal, round, and reactive to light.  Neck:     Musculoskeletal: Normal range of motion and neck supple. No neck rigidity or muscular tenderness.  Cardiovascular:     Rate and Rhythm: Regular rhythm. Tachycardia present.  Pulmonary:     Effort: Pulmonary effort is normal.     Breath sounds: Normal breath sounds. Stridor present.     Comments: Increased respiratory effort.  Stridorous sounds.  Lungs  relatively clear. Abdominal:     General: Bowel sounds are normal.     Palpations: Abdomen is soft.     Tenderness: There is no abdominal tenderness. There is no guarding or rebound.  Musculoskeletal: Normal range of motion.        General: No swelling, tenderness, deformity or signs of injury.     Right lower leg: No edema.     Left lower leg: No edema.  Lymphadenopathy:     Cervical: No cervical adenopathy.  Skin:    General: Skin is warm and dry.     Findings: No erythema or rash.  Neurological:     Mental Status: He is alert and oriented to person, place, and time.     Comments: Contracted right-sided hemiparesis.  Patient is awake and alert.  Psychiatric:        Behavior: Behavior normal.      ED Treatments / Results  Labs (all labs ordered are listed, but only abnormal results are displayed) Labs Reviewed  CBC WITH DIFFERENTIAL/PLATELET - Abnormal; Notable for the following components:      Result Value   WBC 18.9 (*)    RBC 3.92 (*)    Hemoglobin 10.4 (*)    HCT 33.6 (*)    RDW 16.5 (*)    Neutro Abs 14.8 (*)    Monocytes Absolute 1.3 (*)    Abs Immature Granulocytes 0.20 (*)    All other components within normal limits  COMPREHENSIVE METABOLIC PANEL - Abnormal; Notable for the following components:   Glucose, Bld 296 (*)    BUN 65 (*)    Creatinine, Ser 2.03 (*)    GFR calc non Af Amer 32 (*)    GFR calc Af Amer 37 (*)    All other components within normal limits  SARS CORONAVIRUS 2 (HOSPITAL ORDER, [facility])    EKG EKG Interpretation  Date/Time:  Wednesday October 25 2018 13:15:15 EDT Ventricular Rate:  108 PR Interval:    QRS Duration: 82 QT Interval:  338 QTC Calculation: 453 R Axis:   3 Text Interpretation:  Sinus tachycardia Probable left atrial enlargement Confirmed by Julianne Rice [id number]) on 10/25/2018 3:47:38 PM  Radiology Dg Chest Port 1 View  Result Date: 10/25/2018 CLINICAL DATA:  Shortness of breath  EXAM: PORTABLE CHEST 1 VIEW COMPARISON:  10/23/2010 FINDINGS: Bilateral mild interstitial thickening. Bibasilar airspace disease likely reflecting atelectasis. No significant pleural effusions. No pneumothorax. Stable cardiomediastinal silhouette. No aggressive osseous lesion. IMPRESSION: Mild bilateral interstitial thickening likely reflecting mild interstitial edema. Bibasilar atelectasis. Electronically Signed   By: Kathreen Devoid   On: 10/25/2018 13:41    Procedures Procedures (including critical care time)  Medications Ordered in ED Medications  ondansetron (ZOFRAN) 4 MG/2ML injection (  Not Given 10/25/18 1253)  levofloxacin (LEVAQUIN) IVPB 500 mg (has no administration in time range)  sodium chloride 0.9 % bolus 500 mL (has no administration in time range)  morphine 4 MG/ML injection 4 mg (4 mg Intravenous Given 10/25/18 1252)     Initial Impression / Assessment and Plan / ED Course  I have reviewed the triage vital signs and the nursing notes.  Pertinent labs & imaging results that were available during my care of the patient were reviewed by me and considered in my medical decision making (see chart for details).        Patient with similar presentation 2 days ago.  Suction in the emergency department with improvement of his symptoms.  Patient remains afebrile.  Tachycardia has resolved.  No obvious respiratory distress.  Maintaining saturations in the mid 90s on room air.  Patient does have a mild elevation in his white count with nonspecific infiltrates on chest x-ray.  Question mild edema versus aspiration versus pneumonia.  I have contacted palliative care who will speak with family regarding goals of care and needs at home.  Palliative care and I spoke at length with patient's wife.  Given elevated white blood cell count and infiltrates on chest x-ray will treat for pneumonia, community-acquired versus aspiration.  Patient also has a trending up of his creatinine.  Will give  IV fluids.  Will also test for coronavirus though no known exposures.  Discussed with hospitalist who will see patient in the emergency department and admit. Final Clinical Impressions(s) / ED Diagnoses   Final diagnoses:  Community acquired pneumonia, unspecified laterality  AKI (acute kidney injury) [facility])    ED Discharge Orders    None       Julianne Rice, MD 10/25/18 1624

## 2018-10-25 NOTE — ED Notes (Signed)
Bed: FH21 Expected date:  Expected time:  Means of arrival:  Comments: EMS severe resp distress- racemic epi given

## 2018-10-25 NOTE — Progress Notes (Signed)
PHARMACY NOTE:  ANTIMICROBIAL RENAL DOSAGE ADJUSTMENT  Current antimicrobial regimen includes a mismatch between antimicrobial dosage and estimated renal function. As per policy approved by the Pharmacy & Therapeutics and Medical Executive Committees, the antimicrobial dosage will be adjusted accordingly.  Current antimicrobial and dosage:  Levofloxacin 500 mg q24 hr  Indication: bronchitis vs CAP  Renal Function:   Estimated Creatinine Clearance: 35.6 mL/min (A) (by C-G formula based on SCr of 1.98 mg/dL (H)). []      On intermittent HD, scheduled: []      On CRRT    Antimicrobial dosage has been changed to:  750 mg IV q48 hr (based on most severe indication - CAP)   Additional Comments: n/a   Thank you for allowing pharmacy to be a part of this patient's care.  Reuel Boom, PharmD, BCPS [phone] 10/25/2018, 7:02 PM

## 2018-10-25 NOTE — ED Notes (Signed)
Pt taken off NRB at this time, sats remain 100% and will continue to monitor.

## 2018-10-25 NOTE — Consult Note (Signed)
Consultation Note Date: 10/25/2018   Patient Name: Adam Yang  DOB: 1945-10-16  MRN: [id number]  Age / Sex: 73 y.o., male  PCP: Lawerance Cruel, MD Referring Physician: Julianne Rice, MD  Reason for Consultation: Establishing goals of care and Psychosocial/spiritual support  HPI/Patient Profile: 73 y.o. 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  After being treated with racemic epi, solumedrol, and morphine today the patient was much improved and ready for discharge.  Dr. Lita Mains requested a Palliative Care consultation in support of the patient and his wife.  Clinical Assessment and Goals of Care:  I have reviewed medical records including EPIC notes, labs and imaging, received report from Dr. Lita Mains, and spoke on the phone with Adam Yang (wife)  to discuss diagnosis prognosis, GOC, EOL wishes, disposition and options.  I introduced Palliative Medicine as specialized medical care for people living with serious illness. It focuses on providing relief from the symptoms and stress of a serious illness. The goal is to improve quality of life for both the patient and the family.  We discussed a brief life review of the patient.  Adam Yang always worked hard.  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   Approximately 3 years ago he was diagnosed with nasopharyngeal cancer.  He went thru horrendous treatment but recovered and was doing well.  Last year they went hiking in  the mountains. Then In July 2019 he had the hemorrhagic stroke.  He was left with hemiparesis, non-verbal, no oral intake.  He went to [facility] and was discharged to home in 04/2018.  His wife said when he came home he was bedbound and incontinent.  At home he fluorished - he is now able to stand and transfer allowing him to use the commode and ride in a car.  He loves to watch [employer] in his recliner chair, work with therapies (SLP/PT) and visit with family.  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  She describes her current set up at home - [facility] assists, Porfirio Mylar provides an NP that visits for in home medical care.  Adam Yang talks about using yankar suction effectively at scheduled times during the day.  She has a speaking machine that allows Adam Yang to communicate.  Adam Yang regularly has speech therapy in and is currently advocating for another swallow study.    She describes their two children who have moved back into the home (COVID) and the tremendous support they receive from friends and  church.  Adam Yang expresses that they have been "no where near ready for Palliative or Hospice".  Conversely Adam Yang confides that things have become much more difficult due to COVID.  Her NP has been unable to make visits and she has had much less assistance from her therapy services.  Adam Yang describes exhaustion and burn out from having to keep "all of the balls in the air with regard to Adam Yang's care".  More recently Adam Yang expresses concern that things are changing with Adam Yang.  She is concerned that he may have had another stroke, she is concerned his cough has become weaker, and she is terrified of the two episodes of hypoxia that caused him to turn blue this week.  Today when [facility] called EMS she said Adam Yang's oxygen sats were in the 64s.  I attempted to elicit values and goals of care important to the patient.  Adam Yang was unable to describe when Adam Yang would  not want to continue aggressive care.  I don't believe she has considered it.  When asked about it she stated Adam Yang's biggest fear is not being able to breathe.  She stated that as long as Adam Yang is "in there" which I took to mean interactive with his family, they would continue to pursue aggressive medical care.   Adam Yang is a DNR, but with full scope treatment.  She wants him to be hospitalized if necessary and continue with aggressive care.  Hospice and Palliative Care services outpatient were explained and offered.  At this time Adam Yang did not feel they would be beneficial.  Questions and concerns were addressed. The family was encouraged to call with questions or concerns.    Primary Decision Maker:  NEXT OF KIN wife Adam Yang.    SUMMARY OF RECOMMENDATIONS    1.  Supportive treatment with possibly steroids, nebulizers, mucomyst etc to reduce the possibility of mucous plugging.  2.  Quick follow up with an internal medicine physician (PCP? Oncology?) who can thoroughly evaluate the changes Adam Yang is experiencing   - including recurrent mucous plugging, decreased strength and responsiveness.  3.  Family not currently interested in Palliative care follow up or Hospice at this time.  Thank you for allowing me to talk with this lovely family.   Code Status/Advance Care Planning:  DNR   Symptom Management:   Per Dr. Lita Mains  Additional Recommendations (Limitations, Scope, Preferences):  Full Scope Treatment  Palliative Prophylaxis:   Aspiration and Oral Care  Psycho-social/Spiritual:   Desire for further Chaplaincy support:  Deeply religious family - Christian.  Prognosis:  Unable to determine.    Discharge Planning: Home with Home Health      Primary Diagnoses: Present on Admission: **None**   I have reviewed the medical record, interviewed the patient and family, and examined the patient. The following aspects are pertinent.  Past Medical History:  Diagnosis  Date   Anxiety    Claustrophobia   Arthritis    Cancer ([place])    nasopharynx   Chronic cough    EXCESS MUCUS BUILDUP SINCE ORAL SURGERY FROM CANCER    Chronic kidney disease    Renal Insuffiency , Creatinine has been in the 2- 3 range, 11/27/14 Creatine 1.4   Diabetes mellitus without complication ([place])    Type II   Gout    History of radiation therapy 12/23/2014- 02/10/2015   Nasopharynx / Neck Adenopathy   Hyperlipidemia    Hypertension    Inguinal hernia    Right inguinal hernia 08/23/2017   Social  History   Socioeconomic History   Marital status: Married    Spouse name: Not on file   Number of children: 1   Years of education: Not on file   Highest education level: Not on file  Occupational History   Not on file  Social Needs   Financial resource strain: Not on file   Food insecurity:    Worry: Patient refused    Inability: Patient refused   Transportation needs:    Medical: Patient refused    Non-medical: Patient refused  Tobacco Use   Smoking status: Former Smoker    Packs/day: 1.00    Years: 25.00    Pack years: 25.00    Last attempt to quit: 07/12/1998    Years since quitting: 20.3   Smokeless tobacco: Never Used  Substance and Sexual Activity   Alcohol use: No   Drug use: No   Sexual activity: Not Currently  Lifestyle   Physical activity:    Days per week: Patient refused    Minutes per session: Patient refused   Stress: Not on file  Relationships   Social connections:    Talks on phone: Patient refused    Gets together: Patient refused    Attends religious service: Patient refused    Active member of club or organization: Patient refused    Attends meetings of clubs or organizations: Patient refused    Relationship status: Patient refused  Other Topics Concern   Not on file  Social History Narrative   Patient is married and had 1 child. 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   Family History  Problem Relation Age of Onset   Cancer Mother        throat ca   Cancer Father        lung ca   Cancer Brother        throat ca   Heart block Brother    Scheduled Meds:  ondansetron       Continuous Infusions: PRN Meds:. Allergies  Allergen Reactions   Cefepime     Urticarial rash - unclear cause, had been receiving cefepime for several days and had also had flagyl as well   Flagyl [Metronidazole]     Urticarial rash - unclear, was also receiving cefepime        Vital Signs: BP 134/73    Pulse 98    Temp 98 F (36.7 C) (Axillary)    Resp (!) 21    Ht 6' (1.829 m)    Wt 72.6 kg    SpO2 96%    BMI 21.70 kg/m          SpO2: SpO2: 96 % O2 Device:SpO2: 96 % O2 Flow Rate: .O2 Flow Rate (L/min): 15 L/min  IO: Intake/output summary: No intake or output data in the 24 hours ending 10/25/18 1451  LBM:   Baseline Weight: Weight: 72.6 kg Most recent weight: Weight: 72.6 kg     Palliative Assessment/Data: 40%     Time In: 3:00 Time Out: 4:20 Time Total: 80 min. Greater than 50%  of this time was spent counseling and coordinating care related to the above assessment and plan. The above conversation was completed via telephone due to the visitor restrictions during the COVID-19 pandemic. Thorough chart review and discussion with necessary members of the care team was completed as part of assessment. All issues were discussed and addressed but no physical exam  was performed.  Signed by: Florentina Jenny, PA-C Palliative Medicine Pager: [phone]  Please contact Palliative Medicine Team phone at [phone] for questions and concerns.  For individual provider: See Shea Evans

## 2018-10-25 NOTE — ED Notes (Signed)
Report given to Enis Gash, RN at this time and will be transported to floor with RN on COVID precautions.

## 2018-10-26 ENCOUNTER — Encounter (HOSPITAL_COMMUNITY): Payer: Self-pay | Admitting: Interventional Radiology

## 2018-10-26 ENCOUNTER — Inpatient Hospital Stay (HOSPITAL_COMMUNITY): Payer: Medicare Other

## 2018-10-26 DIAGNOSIS — R6889 Other general symptoms and signs: Secondary | ICD-10-CM

## 2018-10-26 DIAGNOSIS — N179 Acute kidney failure, unspecified: Secondary | ICD-10-CM

## 2018-10-26 DIAGNOSIS — R0603 Acute respiratory distress: Secondary | ICD-10-CM

## 2018-10-26 DIAGNOSIS — J181 Lobar pneumonia, unspecified organism: Secondary | ICD-10-CM

## 2018-10-26 DIAGNOSIS — J9601 Acute respiratory failure with hypoxia: Secondary | ICD-10-CM

## 2018-10-26 DIAGNOSIS — R061 Stridor: Secondary | ICD-10-CM

## 2018-10-26 DIAGNOSIS — Z515 Encounter for palliative care: Secondary | ICD-10-CM

## 2018-10-26 HISTORY — PX: IR REPLC GASTRO/COLONIC TUBE PERCUT W/FLUORO: IMG2333

## 2018-10-26 LAB — BASIC METABOLIC PANEL
Anion gap: 13 (ref 5–15)
BUN: 67 mg/dL — ABNORMAL HIGH (ref 8–23)
CO2: 23 mmol/L (ref 22–32)
Calcium: 9.5 mg/dL (ref 8.9–10.3)
Chloride: 106 mmol/L (ref 98–111)
Creatinine, Ser: 1.97 mg/dL — ABNORMAL HIGH (ref 0.61–1.24)
GFR calc Af Amer: 39 mL/min — ABNORMAL LOW (ref >60)
GFR calc non Af Amer: 33 mL/min — ABNORMAL LOW (ref >60)
Glucose, Bld: 156 mg/dL — ABNORMAL HIGH (ref 70–99)
Potassium: 4.6 mmol/L (ref 3.5–5.1)
Sodium: 142 mmol/L (ref 135–145)

## 2018-10-26 MED ORDER — MIDAZOLAM HCL 2 MG/2ML IJ SOLN
2.0000 mg | Freq: Once | INTRAMUSCULAR | Status: AC
  Administered 2018-10-26: 2 mg via INTRAVENOUS

## 2018-10-26 MED ORDER — MORPHINE SULFATE (PF) 2 MG/ML IV SOLN
INTRAVENOUS | Status: AC
  Filled 2018-10-26: qty 1

## 2018-10-26 MED ORDER — RACEPINEPHRINE HCL 2.25 % IN NEBU
0.5000 mL | INHALATION_SOLUTION | RESPIRATORY_TRACT | Status: AC
  Administered 2018-10-26: 0.5 mL via RESPIRATORY_TRACT
  Filled 2018-10-26: qty 0.5

## 2018-10-26 MED ORDER — LORAZEPAM 2 MG/ML IJ SOLN: 0.5000 mg | Freq: Once | INTRAMUSCULAR | Status: DC

## 2018-10-26 MED ORDER — SODIUM CHLORIDE 0.9 % IV SOLN
INTRAVENOUS | Status: DC
  Administered 2018-10-26: 11:00:00 via INTRAVENOUS

## 2018-10-26 MED ORDER — LORAZEPAM 2 MG/ML IJ SOLN
0.5000 mg | Freq: Once | INTRAMUSCULAR | Status: AC
  Administered 2018-10-26: 0.5 mg via INTRAVENOUS
  Filled 2018-10-26: qty 1

## 2018-10-26 MED ORDER — IOHEXOL 300 MG/ML  SOLN
50.0000 mL | Freq: Once | INTRAMUSCULAR | Status: AC | PRN
  Administered 2018-10-26: 5 mL via ORAL

## 2018-10-26 MED ORDER — MIDAZOLAM HCL 2 MG/2ML IJ SOLN
INTRAMUSCULAR | Status: AC
  Filled 2018-10-26: qty 2

## 2018-10-26 MED ORDER — METHYLPREDNISOLONE SODIUM SUCC 125 MG IJ SOLR: 125.0000 mg | Freq: Once | INTRAMUSCULAR | Status: AC

## 2018-10-26 MED ORDER — METHYLPREDNISOLONE SODIUM SUCC 125 MG IJ SOLR
INTRAMUSCULAR | Status: AC
  Administered 2018-10-26: 14:00:00
  Filled 2018-10-26: qty 2

## 2018-10-26 MED ORDER — AMLODIPINE BESYLATE 10 MG PO TABS
10.0000 mg | ORAL_TABLET | Freq: Every day | ORAL | Status: DC
  Administered 2018-10-27: 10 mg
  Filled 2018-10-26 (×2): qty 1

## 2018-10-26 MED ORDER — RACEPINEPHRINE HCL 2.25 % IN NEBU: 0.5000 mL | INHALATION_SOLUTION | Freq: Once | RESPIRATORY_TRACT | Status: DC

## 2018-10-26 MED ORDER — MORPHINE SULFATE (PF) 2 MG/ML IV SOLN
1.0000 mg | Freq: Once | INTRAVENOUS | Status: AC
  Administered 2018-10-26: 1 mg via INTRAVENOUS

## 2018-10-26 NOTE — Significant Event (Signed)
Rapid Response Event Note  Overview: Time Called: 1330 Arrival Time: 1340 Event Type: Respiratory Notified by bedside RN in regards to patient having difficulty breathing. Patient has history of nasopharyngeal cancer that has been treated with chemo and radiation in past. Upon entering patient's room. Patient having stridor and respiratory distress. Respiratory paged to bedside along MD Nettey.  Initial Focused Assessment: Neuro: Patient able to follow basic commands but was in discomfort due to the work of breathing.  Cardiac: Patient ST-HR 120s Pulmonary: RR 20s-30s, NRB O2 90s-100s. Tried patient  4-6L but patient slowly decreased O2 Sats.   Interventions: MD Nettey ordered 1mg  of morhine CCM notified, MD Byrum suctioned patient at bedside and ordered 2mg  of versed to be given  Plan of Care (if not transferred): Wife notified of patient's condition by MD. Patient appeared to be more comfortable after suctioning, giving morphine and versed. Continue to monitor and assess patient on Telemetry floor at this time. Call Rapid Response to assist with any significant changes.  Event Summary:   at      at          [place]

## 2018-10-26 NOTE — Consult Note (Addendum)
NAME:  Adam Yang, MRN:  [id number], DOB:  12-17-1945, LOS: 1 ADMISSION DATE:  10/25/2018, CONSULTATION DATE:  4/16 REFERRING MD:  Dr. Lonny Prude, CHIEF COMPLAINT:  Dyspnea    Brief History   73 year old male with history of ICH and nasopharyngeal cancer admitted 4/13 with respiratory distress. Despite ruling out for COVID-19 he had ongoing dyspnea and difficulty managing secretions.   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 He is felt to be in remission as of 2018. He also has more recent history of ICH with R sided deficits in summer of 2019. He has aspirated in the past, and has PEG. EMS was called to his home on 4/13 for respiratory distress. Upon their arrival he was found to be hypoxic and stridulous. This improved in the ED with suctioning. He was admitted for cough and airway secretion management issues. Treated with ABX for possible aspiration and ruled out for COVID-19. Despite these therapies his stridor and profound dyspnea returned and PCCM was consulted on 4/16.  Past Medical History   has a past medical history of Anxiety, Arthritis, Cancer ([place]), Chronic cough, Chronic kidney disease, Diabetes mellitus without complication ([place]), Gout, History of radiation therapy (12/23/2014- 02/10/2015), Hyperlipidemia, Hypertension, Inguinal hernia, and Right inguinal hernia (08/23/2017).   Antimicrobials:  levaquin   Objective   Blood pressure (!) 125/92, pulse (!) 112, temperature 98.9 F (37.2 C), temperature source Axillary, resp. rate (!) 25, height 6' (1.829 m), weight 72.6 kg, SpO2 96 %.        Intake/Output Summary (Last 24 hours) at 10/26/2018 1358 Last data filed at 10/26/2018 0504 Gross per 24 hour  Intake 734.15 ml  Output -  Net 734.15 ml   Filed Weights   10/25/18 1244  Weight: 72.6 kg    Examination: General: thin  elderly male in respiratory extremis HENT: [place]/AT, PERRL, no appreciable JVD Lungs: audible stridor on inspiration. Profound distress. Able to phonate briefly  Cardiovascular: Tachy, regular Abdomen: soft, non-distended Extremities: No acute deformity Neuro: Grossly intact.   Resolved Hospital Problem list     Assessment & Plan:   Stridor: etiology not entirely clear, but almost certainly ties back to his cancer history. Seems to come and go with secretions. Did not resolve with deep suctioning, racemic epinephrine,  or low dose morphine.  Plan: - Patient is DNR and would not want intubation - Seeing as suctioning has failed with purse symptom management - 2mg  versed now - PRN morphine - Dr. Lonny Prude in ongoing discussion with the patient's wife regarding clinical status and goals of care.  - May ultimately require strictly comfort care if unable to manage symptoms.  Acute hypoxemic respiratory failure - Supplemental oxygen to keep O2 sat > 90%  PEG dysfunction - per primary  AKI - per primary  Labs   CBC: Recent Labs  Lab 10/23/18 0846 10/25/18 1256 10/25/18 1745  WBC 14.7* 18.9* 12.1*  NEUTROABS 12.3* 14.8*  --   HGB 10.1* 10.4* 9.8*  HCT 33.4* 33.6* 31.5*  MCV 83.5 85.7 84.7  PLT 249 296 371    Basic Metabolic Panel: Recent Labs  Lab 10/23/18 0846 10/25/18 1256 10/25/18 1745  NA 137 137  --   K 4.1 4.5  --   CL 100 98  --   CO2 24 24  --   GLUCOSE 206* 296*  --  BUN 52* 65*  --   CREATININE 1.91* 2.03* 1.98*  CALCIUM 9.6 9.3  --    GFR: Estimated Creatinine Clearance: 35.6 mL/min (A) (by C-G formula based on SCr of 1.98 mg/dL (H)). Recent Labs  Lab 10/23/18 0846 10/23/18 1040 10/25/18 1256 10/25/18 1745  WBC 14.7*  --  18.9* 12.1*  LATICACIDVEN 1.9 1.3  --   --     Liver Function Tests: Recent Labs  Lab 10/23/18 0846 10/25/18 1256  AST 18 29  ALT 17 16  ALKPHOS 73 72  BILITOT 0.6 0.4  PROT 7.4 8.1  ALBUMIN 3.4* 3.8   No results for  input(s): LIPASE, AMYLASE in the last 168 hours. No results for input(s): AMMONIA in the last 168 hours.  ABG    Component Value Date/Time   TCO2 25 01/26/2018 0919     Coagulation Profile: No results for input(s): INR, PROTIME in the last 168 hours.  Cardiac Enzymes: No results for input(s): CKTOTAL, CKMB, CKMBINDEX, TROPONINI in the last 168 hours.  HbA1C: Hgb A1c MFr Bld  Date/Time Value Ref Range Status  01/27/2018 04:59 AM 6.0 (H) 4.8 - 5.6 % Final    Comment:    (NOTE) Pre diabetes:          5.7%-6.4% Diabetes:              >6.4% Glycemic control for   <7.0% adults with diabetes   08/19/2017 01:36 PM 6.2 (H) 4.8 - 5.6 % Final    Comment:    (NOTE) Pre diabetes:          5.7%-6.4% Diabetes:              >6.4% Glycemic control for   <7.0% adults with diabetes     CBG: No results for input(s): GLUCAP in the last 168 hours.  Review of Systems:   Unable as patient is in extremis  Past Medical History  He,  has a past medical history of Anxiety, Arthritis, Cancer ([place]), Chronic cough, Chronic kidney disease, Diabetes mellitus without complication ([place]), Gout, History of radiation therapy (12/23/2014- 02/10/2015), Hyperlipidemia, Hypertension, Inguinal hernia, and Right inguinal hernia (08/23/2017).   Surgical History    Past Surgical History:  Procedure Laterality Date  . colonoscopy    . COLONOSCOPY W/ POLYPECTOMY    . INGUINAL HERNIA REPAIR Right 08/23/2017   Procedure: OPEN REPAIR RIGHT INGUINAL HERNIA WITH MESH;  Surgeon: Fanny Skates, MD;  Location: [facility];  Service: General;  Laterality: Right;  GENERAL AND TAP BLOCK  . INSERTION OF MESH Right 08/23/2017   Procedure: INSERTION OF MESH;  Surgeon: Fanny Skates, MD;  Location: [facility];  Service: General;  Laterality: Right;  GENERAL AND TAP BLOCK  . IR GASTROSTOMY TUBE MOD SED  02/06/2018  . IR [place] TUBE PERCUT W/FLUORO  04/10/2018  . KNEE SURGERY Left 1990's   tibia crushed, pinned and bone  grafted  . LYMPH NODE BIOPSY    . MULTIPLE EXTRACTIONS WITH ALVEOLOPLASTY N/A 12/06/2014   Procedure: Extraction of tooth #'s 2,4,5,6,7,8,9,10,11,12,13,14,15, 20,22,26,27,28,29 with alveoloplasty.;  Surgeon: Lenn Cal, DDS;  Location: [facility];  Service: Oral Surgery;  Laterality: N/A;     Social History   reports that he quit smoking about 20 years ago. He has a 25.00 pack-year smoking history. He has never used smokeless tobacco. He reports that he does not drink alcohol or use drugs.   Family History   His family history includes Cancer in his brother, father, and mother;  Heart block in his brother.   Allergies Allergies  Allergen Reactions  . Cefepime     Urticarial rash - unclear cause, had been receiving cefepime for several days and had also had flagyl as well  . Flagyl [Metronidazole]     Urticarial rash - unclear, was also receiving cefepime      Home Medications  Prior to Admission medications   Medication Sig Start Date End Date Taking? Authorizing Provider  amLODipine (NORVASC) 5 MG tablet Place 10 mg into feeding tube daily.  09/28/18  Yes [provider]  Melatonin 5 MG TABS Place 5 mg into feeding tube at bedtime.   Yes [provider]  methylphenidate (RITALIN) 5 MG tablet Place 5 mg into feeding tube 2 (two) times daily.   Yes [provider]  Nutritional Supplements (GLUCERNA 1.5 CAL PO) Place 474 mLs into feeding tube 3 (three) times daily.    Yes [provider]  tiZANidine (ZANAFLEX) 4 MG tablet Place 4 mg into feeding tube at bedtime.  12/12/17  Yes [provider]  amLODipine (NORVASC) 10 MG tablet Take 1 tablet (10 mg total) by mouth daily. Patient not taking: Reported on 10/25/2018 02/03/18   Donzetta Starch, NP  artificial tears (LACRILUBE) OINT ophthalmic ointment Place into both eyes every 3 (three) hours as needed for dry eyes. Patient not taking: Reported on 10/25/2018 02/02/18   Donzetta Starch, NP  chlorhexidine  (PERIDEX) 0.12 % solution 15 mLs by Mouth Rinse route 2 (two) times daily. Patient not taking: Reported on 10/25/2018 02/02/18   Donzetta Starch, NP  lisinopril (PRINIVIL,ZESTRIL) 20 MG tablet Take 1 tablet (20 mg total) by mouth daily. Patient not taking: Reported on 10/25/2018 02/02/18   Donzetta Starch, NP  nystatin (MYCOSTATIN) 100000 UNIT/ML suspension Use as directed 5 mLs (500,000 Units total) in the mouth or throat 4 (four) times daily. Patient not taking: Reported on 10/25/2018 02/02/18   Donzetta Starch, NP  polyethylene glycol (MIRALAX / GLYCOLAX) packet Take 17 g by mouth daily. Patient not taking: Reported on 10/25/2018 02/03/18   Donzetta Starch, NP          Georgann Housekeeper, AGACNP-BC [place] Pager [phone] or [phone]  10/26/2018 2:07 PM

## 2018-10-26 NOTE — Progress Notes (Signed)
PROGRESS NOTE    Jacaden Forbush  DTO:[id number] DOB: [birth year] DOA: 10/25/2018 PCP: Lawerance Cruel, MD   Brief Narrative: Arrow Tomko is a 73 y.o. male with medical history of nasopharyngael cancer s/p radiation, HTN, DM, hemorrhagic CVA and PEG tube. Patient presented secondary to hypoxia secondary to mucous plugging. Patient underwent suctioning which improved symptoms.    Assessment & Plan:   Active Problems:   History of nasopharyngeal cancer   Type 2 diabetes mellitus, controlled, with renal complications (HCC)   Status post insertion of percutaneous endoscopic gastrostomy (PEG) tube (HCC)   Chronic kidney disease, stage III (moderate) (HCC)   Copious oral secretions   Pneumonia   Acute respiratory failure with hypoxia Resolved with suctioning. Secondary to mucous plugging. Associated leukocytosis, so pneumonia was in the deferential. COVID-19 testing performed which returned negative. Low concern for COVID-19 infection. Discussed with ID. Will continue treatment for presumed CAP. Weaned to room air.  Acute kidney injury on CKD 3 Baseline creatinine about 1.5 from last year. Creatinine peak of 2.03 -IV fluids  Community acquired pneumonia Recurrent issue. Started on Levaquin (renally adjusted per pharmacy) -Continue Levaquin. Plan for 5 day (3 dose) course  Leukocytosis Improving.  Essential hypertension Controlled. -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 Followed by Dr. Alvy Bimler and Dr. Redmond Baseman (ENT)  Diabetes mellitus, type 2 Controlled with last hemoglobin A1C of 5.1%  S/p PEG Tube malfunctioning. 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  Acute event: Paged by nurse at 1442 with request from pain medication and Ativan.  Walked up to patient's bedside and patient had significant stridor on bedside evaluation.  He appeared in moderate distress  and was on nonrebreather secondary to oxygen desaturation to 70s on room air/nasal canula.  Exam was significant for stridor in addition to transmitted upper airway sounds on lung auscultation.  Accessory muscle usage.  Patient was alert and answering questions appropriately.  Ordered 1 mg of morphine in addition to racemic epinephrine and 125 mg of Solu-Medrol IV.  Consulted critical care for evaluation as well although patient is a DO NOT INTUBATE.  Likely etiology of stridor is mucous plug as patient has recurrent issues with this secondary to inability to clear secretions.  Concern for acute threat to life.  Discussed case with patient's wife and described current presentation and recommended her to be at patient's bedside.  Also discussed the possibility of patient requiring a trach, for which she stated the patient had now wanted; she is currently considering this option at this time.  Patient started not improved with mentioned therapies.  Pulmonology recommended 2 mg of Versed which also did not help with his symptoms.  Discussed with ENT who evaluated patient this evening.  Will order chest x-ray.  Continue oxygen therapy to keep O2 saturations in the 90s.  Patient is still a DNR/DNI.  Critical care time spent: 120 minutes, including time at patient's bedside, discussing with consults, discussing plan with patient's next of kin (wife), patient's nurse/rapid response/RT in addition to documentation.   DVT prophylaxis: Lovenox Code Status:   Code Status: DNR Family Communication: Wife on telephone Disposition Plan: Discharge pending goals of care/wean off oxygen.   Consultants:   PCCM  ENT  Procedures:   None  Antimicrobials:  Levaquin    Subjective: This morning, patient with no issues. No dyspnea.  Objective: Vitals:   10/25/18 2041 10/26/18 0135 10/26/18 0535 10/26/18 0910  BP:  140/72 137/73 (!) 141/77 (!) 146/78  Pulse: (!) 106 92 81 (!) 107  Resp: 18 20 18  (!) 22  Temp:  98.2 F (36.8 C) 99.1 F (37.3 C) 98.9 F (37.2 C)   TempSrc:  Axillary Axillary   SpO2: 100% 98% 98% 96%  Weight:      Height:        Intake/Output Summary (Last 24 hours) at 10/26/2018 1217 Last data filed at 10/26/2018 0504 Gross per 24 hour  Intake 734.15 ml  Output -  Net 734.15 ml   Filed Weights   10/25/18 1244  Weight: 72.6 kg    Examination: AM examination:  General exam: Appears calm and comfortable Respiratory system: Decreased breath sounds. Respiratory effort normal. Cardiovascular system: S1 & S2 heard, RRR. No murmurs, rubs, gallops or clicks. Gastrointestinal system: Abdomen is nondistended, soft and nontender. No organomegaly or masses felt. Normal bowel sounds heard. Central nervous system: Alert and oriented. Aphasia/dysarthria Extremities: No edema. No calf tenderness Skin: No cyanosis. No rashes Psychiatry: Judgement and insight appear normal. Mood & affect appropriate.     Data Reviewed: I have personally reviewed following labs and imaging studies  CBC: Recent Labs  Lab 10/23/18 0846 10/25/18 1256 10/25/18 1745  WBC 14.7* 18.9* 12.1*  NEUTROABS 12.3* 14.8*  --   HGB 10.1* 10.4* 9.8*  HCT 33.4* 33.6* 31.5*  MCV 83.5 85.7 84.7  PLT 249 296 979   Basic Metabolic Panel: Recent Labs  Lab 10/23/18 0846 10/25/18 1256 10/25/18 1745  NA 137 137  --   K 4.1 4.5  --   CL 100 98  --   CO2 24 24  --   GLUCOSE 206* 296*  --   BUN 52* 65*  --   CREATININE 1.91* 2.03* 1.98*  CALCIUM 9.6 9.3  --    GFR: Estimated Creatinine Clearance: 35.6 mL/min (A) (by C-G formula based on SCr of 1.98 mg/dL (H)). Liver Function Tests: Recent Labs  Lab 10/23/18 0846 10/25/18 1256  AST 18 29  ALT 17 16  ALKPHOS 73 72  BILITOT 0.6 0.4  PROT 7.4 8.1  ALBUMIN 3.4* 3.8   No results for input(s): LIPASE, AMYLASE in the last 168 hours. No results for input(s): AMMONIA in the last 168 hours. Coagulation Profile: No results for input(s): INR, PROTIME in  the last 168 hours. Cardiac Enzymes: No results for input(s): CKTOTAL, CKMB, CKMBINDEX, TROPONINI in the last 168 hours. BNP (last 3 results) No results for input(s): PROBNP in the last 8760 hours. HbA1C: No results for input(s): HGBA1C in the last 72 hours. CBG: No results for input(s): GLUCAP in the last 168 hours. Lipid Profile: No results for input(s): CHOL, HDL, LDLCALC, TRIG, CHOLHDL, LDLDIRECT in the last 72 hours. Thyroid Function Tests: No results for input(s): TSH, T4TOTAL, FREET4, T3FREE, THYROIDAB in the last 72 hours. Anemia Panel: No results for input(s): VITAMINB12, FOLATE, FERRITIN, TIBC, IRON, RETICCTPCT in the last 72 hours. Sepsis Labs: Recent Labs  Lab 10/23/18 0846 10/23/18 1040  LATICACIDVEN 1.9 1.3    Recent Results (from the past 240 hour(s))  Blood Culture (routine x 2)     Status: None (Preliminary result)   Collection Time: 10/23/18  9:09 AM  Result Value Ref Range Status   Specimen Description BLOOD LEFT WRIST  Final   Special Requests   Final    BOTTLES DRAWN AEROBIC AND ANAEROBIC Blood Culture adequate volume   Culture   Final    NO GROWTH 3 DAYS Performed at  [facility], [place] [address], [place], [place] [address]    Report Status PENDING  Incomplete  Blood Culture (routine x 2)     Status: None (Preliminary result)   Collection Time: 10/23/18  9:09 AM  Result Value Ref Range Status   Specimen Description BLOOD LEFT ANTECUBITAL  Final   Special Requests   Final    BOTTLES DRAWN AEROBIC AND ANAEROBIC Blood Culture results may not be optimal due to an excessive volume of blood received in culture bottles   Culture   Final    NO GROWTH 3 DAYS Performed at [facility], [place] [address], [place], [place] [address]    Report Status PENDING  Incomplete  SARS Coronavirus 2 [facility] order, Performed in [place] hospital lab)     Status: None   Collection Time: 10/25/18  5:39 PM  Result Value Ref Range Status   SARS Coronavirus 2  NEGATIVE NEGATIVE Final    Comment: (NOTE) If result is NEGATIVE SARS-CoV-2 target nucleic acids are NOT DETECTED. The SARS-CoV-2 RNA is generally detectable in upper and lower  respiratory specimens during the acute phase of infection. The lowest  concentration of SARS-CoV-2 viral copies this assay can detect is 250  copies / mL. A negative result does not preclude SARS-CoV-2 infection  and should not be used as the sole basis for treatment or other  patient management decisions.  A negative result may occur with  improper specimen collection / handling, submission of specimen other  than nasopharyngeal swab, presence of viral mutation(s) within the  areas targeted by this assay, and inadequate number of viral copies  (<250 copies / mL). A negative result must be combined with clinical  observations, patient history, and epidemiological information. If result is POSITIVE SARS-CoV-2 target nucleic acids are DETECTED. The SARS-CoV-2 RNA is generally detectable in upper and lower  respiratory specimens dur ing the acute phase of infection.  Positive  results are indicative of active infection with SARS-CoV-2.  Clinical  correlation with patient history and other diagnostic information is  necessary to determine patient infection status.  Positive results do  not rule out bacterial infection or co-infection with other viruses. If result is PRESUMPTIVE POSTIVE SARS-CoV-2 nucleic acids MAY BE PRESENT.   A presumptive positive result was obtained on the submitted specimen  and confirmed on repeat testing.  While 2019 novel coronavirus  (SARS-CoV-2) nucleic acids may be present in the submitted sample  additional confirmatory testing may be necessary for epidemiological  and / or clinical management purposes  to differentiate between  SARS-CoV-2 and other Sarbecovirus currently known to infect humans.  If clinically indicated additional testing with an alternate test  methodology [phone])  is advised. The SARS-CoV-2 RNA is generally  detectable in upper and lower respiratory sp ecimens during the acute  phase of infection. The expected result is Negative. Fact Sheet for Patients:  StrictlyIdeas.no Fact Sheet for Healthcare Providers: BankingDealers.co.za This test is not yet approved or cleared by the [place] FDA and has been authorized for detection and/or diagnosis of SARS-CoV-2 by FDA under an Emergency Use Authorization (EUA).  This EUA will remain in effect (meaning this test can be used) for the duration of the COVID-19 declaration under Section 564(b)(1) of the Act, 21 U.S.C. section 360bbb-3(b)(1), unless the authorization is terminated or revoked sooner. Performed at [facility], [place] [address], [place], [place] [address]          Radiology Studies: Dg  Chest Port 1 View  Result Date: 10/25/2018 CLINICAL DATA:  Shortness of breath EXAM: PORTABLE CHEST 1 VIEW COMPARISON:  10/23/2010 FINDINGS: Bilateral mild interstitial thickening. Bibasilar airspace disease likely reflecting atelectasis. No significant pleural effusions. No pneumothorax. Stable cardiomediastinal silhouette. No aggressive osseous lesion. IMPRESSION: Mild bilateral interstitial thickening likely reflecting mild interstitial edema. Bibasilar atelectasis. Electronically Signed   By: Kathreen Devoid   On: 10/25/2018 13:41        Scheduled Meds: . amLODipine  10 mg Per Tube Daily  . enoxaparin (LOVENOX) injection  40 mg Subcutaneous Q24H  . feeding supplement (GLUCERNA 1.2 CAL)  474 mL Per Tube TID AC  . free water  500 mL Per Tube Q8H  . guaiFENesin  5 mL Per Tube QID  . methylphenidate  5 mg Per Tube BID  . sodium chloride flush  3 mL Intravenous Q12H  . tiZANidine  4 mg Per Tube QHS   Continuous Infusions: . sodium chloride    . sodium chloride 100 mL/hr at 10/26/18 1041  . levofloxacin (LEVAQUIN) IV       LOS: 1 day      Cordelia Poche, MD [facility] 10/26/2018, 12:17 PM  If 7PM-7AM, please contact night-coverage [email or web address]

## 2018-10-26 NOTE — Progress Notes (Signed)
I have spoken with pt's PCP, nursing staff, reviewed patient's chart. They have a clear alternative diagnosis (nasopharyngela CA and frequent plugging) and COVID precautions are being d/c.  He has had no fever in hospital. His stats have returned to 97% on RA.

## 2018-10-26 NOTE — Consult Note (Signed)
Patient known to me.  Asked to consult regarding management of thick secretions.  Last seen by me one year ago for follow-up after treatment of nasopharyngeal carcinoma.  He was doing fairly well at that point.  He suffered a stroke last July and required three months of inpatient rehab.  He remains right-sided hemiplegic with aphasia and dysphagia.  He required G-tube placement after developing pneumonia.  He remains G-tube dependent and takes no oral diet.  Secretion management has become more difficult following the stroke and his wife performs routine deep throat suctioning to clear obstructing thick secretions.  Early this week, he had an obstructive event that caused him to turn blue and that his wife could not resolve.  EMS was called and he was found to have stridulous breathing and hypoxia.  He was given oxygen and his throat was suctioned, treated at the ER, and released.  He had another event two days later that was similar and returned to the ER via EMS.  This time, he was admitted to the hospital.  He is being treated for potential pneumonia.  Today, he had another episode witnessed by the hospitalist.  His wife was able to come and assist with successful suctioning.  He was also treated with racemic epinephrine, Solu-Medrol, and oxygen.  Reviewed PMH, PSH, Meds, Allergies, Soc Hist, and ROS  AF VSS Alert, NAD Good voice but marked aphasia Grossly normal hearing Normal eyes, external nose, and external ears Edentulous, caked secretions in oropharynx Neck normal CNs II-XII normal except for left-sided tongue atrophy  A/P: Difficulty managing thick secretions, history of nasopharyngeal cancer, history of stroke  We discussed his problem at length including with his wife and daughter.  I witnessed his wife suction thick secretions with expertise and effectiveness.  She is highly capable to continue managing his problem this way, if effective.  I do not have any easy additional medical  options to help with the secretions.  I did emphasize good hydration and moisturization.  We discussed a tracheostomy as an option to provide a safe airway and conduit for suctioning.  He would still be able to speak with a  Valve.  In fact, the tube could probably remain capped most of the time and be opened mainly for suction or to alleviate an obstructive event.  The patient and family will consider this option.  Palliative service input noted.

## 2018-10-26 NOTE — TOC Initial Note (Signed)
Transition of Care [facility]) - Initial/Assessment Note    Patient Details  Name: Adam Yang MRN: [id number] Date of Birth: April 14, 1946  [facility] CM/SW Contact:    Purcell Mouton, RN Phone Number: 10/26/2018, 1:53 PM  Clinical Narrative:                   Expected Discharge Plan: [facility](HH plus Hospice) Barriers to Discharge: No Barriers Identified   Patient Goals and CMS Choice: Pt has a History of nasopharyngeal. Admitted with cough, dyspnea.        Expected Discharge Plan and Services Expected Discharge Plan: [facility](HH plus Hospice)       Living arrangements for the past 2 months: Single Family Home                          Prior Living Arrangements/Services Living arrangements for the past 2 months: Single Family Home Lives with:: Spouse Patient language and need for interpreter reviewed:: No Do you feel safe going back to the place where you live?: Yes               Activities of Daily Living Home Assistive Devices/Equipment: Feeding equipment ADL Screening (condition at time of admission) Patient's cognitive ability adequate to safely complete daily activities?: Yes Is the patient deaf or have difficulty hearing?: No Does the patient have difficulty seeing, even when wearing glasses/contacts?: No Does the patient have difficulty concentrating, remembering, or making decisions?: Yes Patient able to express need for assistance with ADLs?: No Does the patient have difficulty dressing or bathing?: Yes Independently performs ADLs?: No Communication: Needs assistance Walks in Home: Dependent Is this a change from baseline?: Pre-admission baseline Does the patient have difficulty walking or climbing stairs?: Yes Weakness of Legs: Right Weakness of Arms/Hands: Right  Permission Sought/Granted Permission sought to share information with : Case Manager                Emotional  Assessment Appearance:: Appears stated age   Affect (typically observed): Accepting Orientation: : Oriented to Self, Oriented to Place, Oriented to  Time, Oriented to Situation      Admission diagnosis:  AKI (acute kidney injury) ([place]) [N17.9] PEG tube malfunction ([place]) [K94.23] Community acquired pneumonia, unspecified laterality [J18.9] Patient Active Problem List   Diagnosis Date Noted  . Pneumonia 10/25/2018  . Palliative care patient   . 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 Emphysema of lung ([place]) 03/22/2018  . CVA (cerebrovascular accident due to intracerebral hemorrhage) ([place]) 03/05/2018  . Acute respiratory failure with hypoxia ([place]) 03/05/2018  . Hypotension 03/05/2018  . Hyperlipidemia associated with type 2 diabetes mellitus ([place]) 03/05/2018  . Copious oral secretions   . Goals of care, counseling/discussion   . Palliative care by specialist   . Malnutrition of moderate degree 02/24/2018  . Pneumonia of both lungs due to infectious organism 02/23/2018  . Pneumonia due to infectious organism 02/23/2018  . Aspiration pneumonia ([place]) 02/22/2018  . Aspiration pneumonia of right lower lobe due to milk ([place])   . Dysphasia   . Anemia of chronic disease   . Hypernatremia   . Transaminitis   . Hypertensive emergency 02/02/2018  . Intraparenchymal hemorrhage of brain ([place]) 02/02/2018  . Hemiparesis affecting right side as late effect of stroke ([place])   . Aphasia, post-stroke   . Dysphagia, post-stroke   . Nontraumatic subcortical hemorrhage of left  cerebral hemisphere ([place])   . Diabetes mellitus type 2 in nonobese (HCC)   . AKI (acute kidney injury) ([place])   . Hyponatremia   . Chronic kidney disease   . Cytotoxic brain edema ([place]) 01/28/2018  . Brain herniation ([place]) 01/28/2018  . IVH (intraventricular hemorrhage) ([place]) 01/27/2018  . Right inguinal hernia 08/23/2017  . Chronic kidney disease, stage III (moderate) ([place]) 02/18/2017   . 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 Other fatigue 06/18/2016  . Essential hypertension 10/17/2015  . Constipation 02/06/2015  . Drug-induced skin rash 02/03/2015  . Mucositis due to chemotherapy 01/24/2015  . Hyperglycemia due to type 2 diabetes mellitus ([place]) 01/07/2015  . Chronic anxiety 12/19/2014  . Stage 2 chronic renal impairment associated with type 2 diabetes mellitus ([place]) 12/19/2014  . Type 2 diabetes mellitus, controlled, with renal complications ([place]) 34/74/2595  . Status post insertion of percutaneous endoscopic gastrostomy (PEG) tube ([place]) 12/19/2014  . History of nasopharyngeal cancer 11/26/2014   PCP:  Lawerance Cruel, MD Pharmacy:   [place], [place] [address] Phone: [phone] Fax: [phone]     Social Determinants of Health (SDOH) Interventions    Readmission Risk Interventions No flowsheet data found.

## 2018-10-26 NOTE — Progress Notes (Signed)
RT called to PT room due to difficulty breathing. PT was found to be on PRB at 15 lpm with Sp02 97% (Sp02 was not an issue during any point of this visit). RT and MD deep suctioned with [place] which resulted in small, tan, thick secretions. RT NTS suctioned PT up RT and Left nares which resulted in minimal tan, thick, slightly bloody secretions. PT was placed on 100% aerosol face mask.

## 2018-10-26 NOTE — Progress Notes (Signed)
Pt has been having episodes of stridor respirations this am, but not sustained. Oxygen has been off and he has been sating 94-95%. At this time, pt began c/o pain in PEG tube area and became anxious and began with stridor respirations again, but has been sustaining them with oxygen saturation dropping to 71% on RA. Rapid Response, RT and Md notified. Began on non-rebreather and sats are 95-98%. Very anxious. Able to speak single words. Orders received. Eulas Post, RN

## 2018-10-26 NOTE — Progress Notes (Signed)
Request to IR for g-tube malfunction - pull through gastrostomy was originally placed in IR on 02/06/18 by Dr. Anselm Pancoast and then converted to 20 Fr 2.3 cm MicKey button gastrostomy on 04/10/18 by Dr. Vernard Gambles.  Per RN there is concern that the gastrostomy tube became dislodged during transfer to hospital by EMS. She reports that the tube remains in the skin but when the night RN attempted to use the tube for medications it looked as if the medications came out through the insertion site.  Will bring patient to IR today for injection/possible exchange of gastrostomy tube. Please hold tube feedings/medications/free water until after he is seen in IR.  Please call with questions or concerns.  Candiss Norse, PA-C Pager# [phone]

## 2018-10-26 NOTE — Progress Notes (Signed)
PT Cancellation Note  Patient Details Name: Edrian Melucci MRN: [id number] DOB: 21-Feb-1946   Cancelled Treatment:     PT order received but eval deferred 2* pt with increased WOB.  Will follow.   Mitul Hallowell 10/26/2018, 2:56 PM

## 2018-10-26 NOTE — Progress Notes (Signed)
Met with patient and family at bedside.    Adam Yang and Adam Yang are inclined to try to use the new medications and pulmonary hygiene rather than a tracheostomy at this point.  Adam Yang took me outside the room and told me that Adam Yang would feel "dehumanized" with a trach.  We viewed xrays and labs together.  I listened and provided emotional support to Adam Yang.  PE:  Thin well developed male, lying comfortably in bed, smiling. CV rrr Resp coarse and slightly rhonchorous lung sounds bilaterally. Abdomen thin, soft, nt, nd  Assessment/Recommendations  Concern for progressive decline after post oncological treatment with surgery/chemo/xrt and post hemorraghic stroke.  DNR  Treat the treatable with minimally invasive measures. Wife is an exceptionally good care giver.  Inclined to try to avoid trach at this point.  They are being followed by a form of Palliative from [facility]   Will decrease IV fluids as G-tube is now working  [employer] for am.  Florentina Jenny, PA-C Palliative Medicine Pager: [phone]  60 min.

## 2018-10-27 ENCOUNTER — Encounter (HOSPITAL_COMMUNITY): Payer: Self-pay

## 2018-10-27 DIAGNOSIS — Z931 Gastrostomy status: Secondary | ICD-10-CM

## 2018-10-27 LAB — BASIC METABOLIC PANEL
Anion gap: 11 (ref 5–15)
BUN: 66 mg/dL — ABNORMAL HIGH (ref 8–23)
CO2: 25 mmol/L (ref 22–32)
Calcium: 10 mg/dL (ref 8.9–10.3)
Chloride: 108 mmol/L (ref 98–111)
Creatinine, Ser: 1.84 mg/dL — ABNORMAL HIGH (ref 0.61–1.24)
GFR calc Af Amer: 42 mL/min — ABNORMAL LOW (ref >60)
GFR calc non Af Amer: 36 mL/min — ABNORMAL LOW (ref >60)
Glucose, Bld: 146 mg/dL — ABNORMAL HIGH (ref 70–99)
Potassium: 4 mmol/L (ref 3.5–5.1)
Sodium: 144 mmol/L (ref 135–145)

## 2018-10-27 MED ORDER — PRO-STAT SUGAR FREE PO LIQD: 30.0000 mL | Freq: Two times a day (BID) | ORAL | 0 refills | Status: DC

## 2018-10-27 MED ORDER — LORAZEPAM 1 MG PO TABS: 1.0000 mg | ORAL_TABLET | Freq: Every day | ORAL | 0 refills | Status: DC | PRN

## 2018-10-27 MED ORDER — PRO-STAT SUGAR FREE PO LIQD: 30.0000 mL | Freq: Two times a day (BID) | ORAL | Status: DC

## 2018-10-27 MED ORDER — FREE WATER: 300.0000 mL | Freq: Three times a day (TID) | Status: DC

## 2018-10-27 MED ORDER — LEVOFLOXACIN 25 MG/ML PO SOLN: 750.0000 mg | ORAL | 0 refills | Status: AC

## 2018-10-27 MED ORDER — GLUCERNA 1.2 CAL PO LIQD
237.0000 mL | Freq: Every day | ORAL | Status: DC
  Administered 2018-10-27: 237 mL
  Filled 2018-10-27 (×3): qty 237

## 2018-10-27 MED ORDER — FREE WATER: 350.0000 mL | Freq: Three times a day (TID) | Status: DC

## 2018-10-27 MED ORDER — FREE WATER: 150.0000 mL | Freq: Three times a day (TID) | Status: DC

## 2018-10-27 MED ORDER — FREE WATER
100.0000 mL | Freq: Every day | Status: DC
  Administered 2018-10-27: 100 mL

## 2018-10-27 MED ORDER — GUAIFENESIN 100 MG/5ML PO SOLN: 5.0000 mL | Freq: Four times a day (QID) | ORAL | 0 refills | Status: DC

## 2018-10-27 MED ORDER — GLUCERNA 1.5 CAL PO LIQD: 237.0000 mL | Freq: Every day | ORAL | Status: DC

## 2018-10-27 MED ORDER — PRO-STAT SUGAR FREE PO LIQD
30.0000 mL | Freq: Two times a day (BID) | ORAL | Status: DC
  Administered 2018-10-27: 30 mL
  Filled 2018-10-27: qty 30

## 2018-10-27 MED ORDER — FREE WATER: 100.0000 mL | Freq: Every day | Status: DC

## 2018-10-27 MED ORDER — LORAZEPAM 2 MG/ML IJ SOLN: 1.0000 mg | Freq: Four times a day (QID) | INTRAMUSCULAR | Status: DC | PRN

## 2018-10-27 MED ORDER — LORAZEPAM 2 MG/ML IJ SOLN
INTRAMUSCULAR | Status: AC
  Administered 2018-10-27: 2 mg
  Filled 2018-10-27: qty 1

## 2018-10-27 MED FILL — LORazepam 1 MG TABS: 1 | 5 days supply | Qty: 5 | Fill #0

## 2018-10-27 MED FILL — levoFLOXacin 750 MG TABS: 750 | 2 days supply | Qty: 2 | Fill #0

## 2018-10-27 NOTE — Progress Notes (Signed)
NUTRITION NOTE RD working remotely.   Able to talk with RN via secure chat. She reports that it took ~2 hours to give TF bolus this AM as patient was only able to tolerate one can and then needed to wait before accepting other can d/t feelings of fullness. Asked RN if patient would do better with receiving 1 can x6/day rather than 2 cans TID. RN feels that patient would do better with this regimen.   Will adjust TF regimen:  1 can Glucerna 1.2 x6/day with 30 ml prostat (or equivalent) BID, 100 ml free water with each TF bolus and 300 ml additional free water TID.   Jarome Matin, MS, RD, LDN, [facility] Clinical Dietitian Pager # [phone] After hours/weekend pager # [phone]

## 2018-10-27 NOTE — Progress Notes (Signed)
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  He has aspirated in the past, and has PEG. EMS was called to his home on 4/13 for respiratory distress.     PT Comments    Therapist called by nursing staff for assisting pt back to bed.  On arrival, pt more agitated and appeared disgruntled with nursing staff attempting to assist him back to bed.  Pt refused transport board or assist with transfer to left side back to bed.  Pt calmed and agreeable to use bed pad to assist staff with lifting pt back to bed.  Pt appeared much more at ease with return to supine in bed and repositioned to comfort.  Pt communicating that his home transfers are much better and seems frustrated with transfer environment in hospital.  Pt will likely have improved performance with familiar environment and routine.   Follow Up Recommendations  Home health PT;Supervision/Assistance - 24 hour     Equipment Recommendations  None recommended by PT    Recommendations for Other Services       Precautions / Restrictions Precautions Precautions: Fall Precaution Comments: R UE hemiplegia, PEG    Mobility  Bed Mobility Overal bed mobility: Needs Assistance Bed Mobility: Rolling Rolling: Min assist   Supine to sit: Min assist;HOB elevated     General bed mobility comments: slight assist for positioning UE and reaching to assist with rolling (pericare upon return to bed)  Transfers Overall transfer level: Needs assistance Equipment used: Sliding board Transfers: Lateral/Scoot Transfers          Lateral/Scoot Transfers: Min assist;With slide board;From elevated  surface General transfer comment: assisted from transport chair back to bed with bed pad and 4 person lifting/scoot  Ambulation/Gait             General Gait Details: nonambulatory   Stairs             Wheelchair Mobility    Modified Rankin (Stroke Patients Only)       Balance Overall balance assessment: Needs assistance Sitting-balance support: Single extremity supported;Feet supported Sitting balance-Leahy Scale: Poor Sitting balance - Comments: requires UE support, if fatigued then requires external trunk support                                    Cognition Arousal/Alertness: Awake/alert Behavior During Therapy: Agitated Overall Cognitive Status: Within Functional Limits for tasks assessed                                 General Comments: pt with difficulty expressing his desires for transfer back to bed, agitated at times however able to calm      Exercises      General Comments        Pertinent Vitals/Pain Pain Assessment: No/denies pain    Home Living Family/patient expects to be discharged to:: Private residence Living Arrangements: Spouse/significant other Available Help at Discharge: Family;Available 24 hours/day Type of Home: House Home Access: Ramped entrance   Home Layout: Multi-level;Able to live on main level with  bedroom/bathroom Home Equipment: Shower seat;Wheelchair - manual;Hospital bed Additional Comments: home environment per last admission, pt reports he has hospital bed, w/c, transfer board at home    Prior Function Level of Independence: Needs assistance  Gait / Transfers Assistance Needed: assisted by spouse to w/c using transfer board       PT Goals (current goals can now be found in the care plan section) Acute Rehab PT Goals PT Goal Formulation: With patient Time For Goal Achievement: 11/10/18 Potential to Achieve Goals: Good Progress towards PT goals: Not progressing toward goals -  comment(more agitated this afternoon)    Frequency    Min 3X/week      PT Plan Current plan remains appropriate    Co-evaluation              AM-PAC PT "6 Clicks" Mobility   Outcome Measure  Help needed turning from your back to your side while in a flat bed without using bedrails?: A Little Help needed moving from lying on your back to sitting on the side of a flat bed without using bedrails?: A Little Help needed moving to and from a bed to a chair (including a wheelchair)?: A Little Help needed standing up from a chair using your arms (e.g., wheelchair or bedside chair)?: Total Help needed to walk in hospital room?: Total Help needed climbing 3-5 steps with a railing? : Total 6 Click Score: 12    End of Session   Activity Tolerance: Treatment limited secondary to agitation Patient left: with call bell/phone within reach;in bed;with nursing/sitter in room Nurse Communication: Mobility status(aware of transfers, can use linen under pt to assist with transfer back to bed if needed)) PT Visit Diagnosis: Other abnormalities of gait and mobility (R26.89)     Time: 1450-1500 PT Time Calculation (min) (ACUTE ONLY): 10 min  Charges:  $Therapeutic Activity: 8-22 mins                     Carmelia Bake, PT, DPT Acute Rehabilitation Services Office: [phone] Pager: [place] E 10/27/2018, 3:30 PM

## 2018-10-27 NOTE — Progress Notes (Signed)
Patient experiencing another episode of resp distress with Stridor respirations. Oxygen on RA noted at 87%. Mask applied and sats recovered at 95%. Md aware and present.Eulas Post, RN

## 2018-10-27 NOTE — Progress Notes (Signed)
Spoke with pt's wife concerning HH needs and Home O2. Alvis Lemmings for [facility] and [facility] for Home O2. Wife will transport, pt home.

## 2018-10-27 NOTE — Evaluation (Signed)
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  He has aspirated in the past, and has PEG. EMS was called to his home on 4/13 for respiratory distress.   Clinical Impression  Pt admitted with above diagnosis. Pt currently with functional limitations due to the deficits listed below (see PT Problem List).  Pt will benefit from skilled PT to increase their independence and safety with mobility to allow discharge to the venue listed below.  Pt reports he typically transfers to w/c with sliding/transfer board.  Pt assisted with placing transfer board and assisted to transport chair (only available) with min assist.  Pt reports spouse assists with transfers at baseline.  Pt may benefit from f/u HHPT upon d/c if pt and spouse agreeable.     Follow Up Recommendations Home health PT;Supervision/Assistance - 24 hour    Equipment Recommendations  None recommended by PT    Recommendations for Other Services       Precautions / Restrictions Precautions Precautions: Fall Precaution Comments: R UE hemiplegia, PEG      Mobility  Bed Mobility Overal bed mobility: Needs Assistance Bed Mobility: Supine to Sit     Supine to sit: Min assist;HOB elevated     General bed mobility comments: slight assist for trunk upright  Transfers Overall transfer level: Needs assistance Equipment used: Sliding board Transfers: Lateral/Scoot Transfers          Lateral/Scoot Transfers: Min assist;With slide board;From elevated surface General transfer comment: attempted as home set up however only transport chair available (recliner drop arm only on affected  side); pt typically transfers to stronger L side, assist mainly for safety and pt anxiety (different equipment and secretions - increased WOB, RN into room to assist with suction)  Ambulation/Gait             General Gait Details: nonambulatory  Stairs            Wheelchair Mobility    Modified Rankin (Stroke Patients Only)       Balance Overall balance assessment: Needs assistance Sitting-balance support: Single extremity supported;Feet supported Sitting balance-Leahy Scale: Poor Sitting balance - Comments: requires UE support, if fatigued then requires external trunk support                                     Pertinent Vitals/Pain Pain Assessment: No/denies pain    Home Living Family/patient expects to be discharged to:: Private residence Living Arrangements: Spouse/significant other Available Help at Discharge: Family;Available 24 hours/day Type of Home: House Home Access: Ramped entrance     Home Layout: Multi-level;Able to live on main level with bedroom/bathroom Home Equipment: Shower seat;Wheelchair - manual;Hospital bed Additional Comments: home environment per last admission, pt reports he has hospital bed, w/c, transfer board at home    Prior Function Level of Independence: Needs assistance   Gait / Transfers Assistance Needed: assisted by spouse to w/c using transfer board           Hand Dominance        Extremity/Trunk Assessment   Upper Extremity Assessment Upper Extremity Assessment: RUE deficits/detail RUE Deficits /  Details: hx R hemiplegia    Lower Extremity Assessment Lower Extremity Assessment: RLE deficits/detail RLE Deficits / Details: hx R hemiparesis       Communication   Communication: Expressive difficulties  Cognition Arousal/Alertness: Awake/alert Behavior During Therapy: Anxious Overall Cognitive Status: Within Functional Limits for tasks assessed                                  General Comments: anxious with mobility, increased secretions - RN in to suction end of session      General Comments      Exercises     Assessment/Plan    PT Assessment Patient needs continued PT services  PT Problem List Decreased mobility;Decreased strength;Decreased balance;Decreased activity tolerance       PT Treatment Interventions Functional mobility training;DME instruction;Therapeutic exercise;Therapeutic activities;Balance training;Patient/family education;Wheelchair mobility training    PT Goals (Current goals can be found in the Care Plan section)  Acute Rehab PT Goals PT Goal Formulation: With patient Time For Goal Achievement: 11/10/18 Potential to Achieve Goals: Good    Frequency Min 3X/week   Barriers to discharge        Co-evaluation               AM-PAC PT "6 Clicks" Mobility  Outcome Measure Help needed turning from your back to your side while in a flat bed without using bedrails?: A Little Help needed moving from lying on your back to sitting on the side of a flat bed without using bedrails?: A Little Help needed moving to and from a bed to a chair (including a wheelchair)?: A Little Help needed standing up from a chair using your arms (e.g., wheelchair or bedside chair)?: A Lot Help needed to walk in hospital room?: Total Help needed climbing 3-5 steps with a railing? : Total 6 Click Score: 13    End of Session   Activity Tolerance: Other (comment)(anxiety, secretions) Patient left: in chair;with call bell/phone within reach;with nursing/sitter in room Nurse Communication: Mobility status(aware of transfers, can use linen under pt to assist with transfer back to bed if needed)) PT Visit Diagnosis: Other abnormalities of gait and mobility (R26.89)    Time: 1505-6979 PT Time Calculation (min) (ACUTE ONLY): 39 min   Charges:   PT Evaluation $PT Eval Low Complexity: 1 Low PT Treatments $Therapeutic Activity: 8-22 mins      Carmelia Bake, PT, DPT Acute Rehabilitation Services Office: [phone] Pager: [phone]   Trena Platt 10/27/2018, 2:22 PM

## 2018-10-27 NOTE — Progress Notes (Addendum)
Initial Nutrition Assessment  RD working remotely.   DOCUMENTATION CODES:   (unable to assess for malnutrition at this time.)  INTERVENTION:  - will adjust TF regimen: 474 ml Glucerna 1.2 TID with 30 ml prostat (or equivalent) BID, 150 ml free water per TF bolus and an additional 350 ml free water TID.  - this regimen will provide 1910 kcal (95% estimated kcal need), 115 grams protein, and 2652 ml free water - will monitor for ability to switch off of Glucerna TF as CBGs are not being checked and patient is not on insulin or other hyperglycemic medication (such as metformin). - will also monitor if free water flushes need to be decreased.     NUTRITION DIAGNOSIS:   Increased nutrient needs related to chronic illness, cancer and cancer related treatments as evidenced by estimated needs.   GOAL:   Patient will meet greater than or equal to 90% of their needs  MONITOR:   TF tolerance, Labs, Weight trends  REASON FOR ASSESSMENT:   Consult Assessment of nutrition requirement/status, Enteral/tube feeding initiation and management  ASSESSMENT:   73 y.o. 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 He presented to the ED on 4/13 for respiratory distress but improved after suctioning and was discharged home. CXR was negative and he was not hypoxic. Patient returned to the ED on 4/15 d/t becoming suddenly cyanotic after coughing. He remained cyanotic until EMS arrived and he was suctioned and transported to the ED. He arrived to the ED on a non-rebreather and has been weaned down to room air.  BMI indicates normal weight. Patient has been NPO since admission. Attempted to call home phone number and cell phone number for patient's wife and got the voicemail for both numbers. Unable to obtain information from patient's wife at this time.   Per review of notes, it appeared that patient was receiving 1  can Glucerna 1.2 every 3 hours for a total of 6 cans/day and 200 ml free water with each TF bolus at home. Order currently in place for 2 cans Glucerna 1.2 TID and 500 ml free water TID. This regimen provides 1710 kcal, 85 grams protein, and 2652 ml free water.   Patient was last seen by a [place] RD on 03/01/18. At that time patient was receiving Jevity 1.5 @ 50 ml/hr with 30 ml prostat BID and 100 ml free water TID. This regimen provides 2000 kcal, 106 grams protein, and 1212 ml free water.  Unable to determine when patient was switched to Glucerna tube feeding. Unsure that it is truly appropriate at this time given CBGs are not being checked and patient not on any medication to aid in glycemic control.   Per chart review, current weight is 160 lb. This weight is +5 lb compared to weight on 4/13 and is also the highest weight patient has been at since before August.      Medications reviewed. Labs reviewed; BUN: 66 mg/dl, creatinine: 1.84 mg/dl, GFR: 36 ml/min.    NUTRITION - FOCUSED PHYSICAL EXAM:  unable to complete at this time.   Diet Order:   Diet Order            Diet NPO time specified  Diet effective now              EDUCATION NEEDS:   No education needs have been identified at this time  Skin:  Skin Assessment:  Reviewed RN Assessment  Last BM:  PTA/unknown  Height:   Ht Readings from Last 1 Encounters:  10/25/18 6' (1.829 m)    Weight:   Wt Readings from Last 1 Encounters:  10/25/18 72.6 kg    Ideal Body Weight:  80.91 kg  BMI:  Body mass index is 21.7 kg/m.  Estimated Nutritional Needs:   Kcal:  2000-2200 kcal  Protein:  110-120 grams  Fluid:  >/= 2.1 L/day     Jarome Matin, MS, RD, LDN, [facility] Inpatient Clinical Dietitian Pager # [phone] After hours/weekend pager # [phone]

## 2018-10-27 NOTE — Progress Notes (Signed)
PT Cancellation Note  Patient Details Name: Adam Yang MRN: [id number] DOB: 05/10/1946   Cancelled Treatment:    Reason Eval/Treat Not Completed: PT screened, no needs identified, will sign off Per Dr. Lonny Prude, no need to see patient.  PT to sign off.   Rusty Villella,KATHrine E 10/27/2018, 9:46 AM Carmelia Bake, PT, DPT Acute Rehabilitation Services Office: [phone] Pager: [phone]

## 2018-10-27 NOTE — Discharge Summary (Signed)
Physician Discharge Summary  Adam Yang KVQ:[id number] DOB: 1946/02/08 DOA: 10/25/2018  PCP: Lawerance Cruel, MD  Admit date: 10/25/2018 Discharge date: 10/27/2018  Admitted From: Home Disposition: Home  Recommendations for Outpatient Follow-up:  1. Follow up with PCP in 1 week 2. Please obtain BMP/CBC in one week 3. Please follow up on the following pending results: None  Home Health: PT, OT, home aide, respiratory Equipment/Devices: Oxygen  Discharge Condition: Stable CODE STATUS: DNR Diet recommendation: Tube feeds   Brief/Interim Summary:  Admission HPI written by Debbe Odea, MD   Chief Complaint: cough, dyspnea  HPI: Adam Yang is a 73 y.o. male with medical history of nasopharyngael cancer s/p radiation, HTN, DM, hemorrhagic CVA and PEG tube. He came in to the ED on 4/13 for respiratory distress but improved after suctioning and was discharged home. CXR was negative and he was not hypoxic.   He returns today (sent by wife via EMS) became suddenly cyanotic after coughing.  He remained cyanotic until EMS arrived and he was suctioned by them and brought in to the ED. He arrived to the ED on an non-rebreather and has been weaned down to room air     Hospital course:  Acute respiratory failure with hypoxia Resolved with suctioning. Secondary to mucous plugging. Associated leukocytosis, so pneumonia was in the deferential. COVID-19 testing performed which returned negative. Low concern for COVID-19 infection. Discussed with ID. Will continue treatment for presumed CAP. Unable to wean to room air secondary to recurrent respiratory failure in setting of recurrent mucous plugging. Will discharge on Levaquin for a 5 day total treatment course.  Stridor Recurrent issue in setting of recurrent mucous plugging. Wife manages this well as an outpatient. Had an significant episode on 4/16 requiring Solu-medrol, racemic epinephrine, morphine and Versed. Symptoms improved  with wife intervention with continued suction and neck massaging. Patient with recurrent stridort his morning which improved with Ativan IV. Will discharge with a 5 day supply of Ativan prn. Patient has qualified for home oxygen. ENT consulted and recommends tracheostomy at some point. This can be decided as an outpatient.  Acute kidney injury on CKD 3 Baseline creatinine about 1.5 from last year. Creatinine peak of 2.03. Down to 1.8 prior to discharge. Continue free water. Recheck BMP in 5-7 days.  Community acquired pneumonia Recurrent issue. Started on Levaquin (renally adjusted per pharmacy). Chest x-ray not very revealing for infiltrate. WBC improved. Levaquin on discharged.  Leukocytosis Improving.  Essential hypertension Controlled. 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 Followed by Dr. Alvy Bimler and Dr. Redmond Baseman (ENT)  Diabetes mellitus, type 2 Controlled with last hemoglobin A1C of 5.1%  S/p PEG Tube malfunctioning. IR consulted for replacement. Working well prior to discharge.  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)   Chronic kidney disease, stage III (moderate) (HCC)   Copious oral secretions   Pneumonia   Palliative care encounter    Discharge Instructions   Allergies as of 10/27/2018      Reactions   Cefepime    Urticarial rash - unclear cause, had been receiving cefepime for several days and had also had flagyl as well   Flagyl [metronidazole]    Urticarial rash - unclear, was also receiving cefepime  Medication List    STOP taking these medications   artificial tears Oint ophthalmic ointment Commonly known as:  LACRILUBE   chlorhexidine 0.12 %  solution Commonly known as:  PERIDEX   lisinopril 20 MG tablet Commonly known as:  ZESTRIL   nystatin 100000 UNIT/ML suspension Commonly known as:  MYCOSTATIN   polyethylene glycol 17 g packet Commonly known as:  MIRALAX / GLYCOLAX     TAKE these medications   amLODipine 5 MG tablet Commonly known as:  NORVASC Place 10 mg into feeding tube daily. What changed:  Another medication with the same name was removed. Continue taking this medication, and follow the directions you see here.   feeding supplement (GLUCERNA 1.5 CAL) Liqd Place 237 mLs into feeding tube 6 (six) times daily. What changed:    how much to take  when to take this   feeding supplement (PRO-STAT SUGAR FREE 64) Liqd Place 30 mLs into feeding tube 2 (two) times daily.   free water Soln Place 100 mLs into feeding tube 6 (six) times daily. With meals   free water Soln Place 300 mLs into feeding tube every 8 (eight) hours. Between meals   guaiFENesin 100 MG/5ML Soln Commonly known as:  ROBITUSSIN Place 5 mLs (100 mg total) into feeding tube 4 (four) times daily.   levofloxacin 25 MG/ML solution Commonly known as:  LEVAQUIN Place 30 mLs (750 mg total) into feeding tube every other day for 2 doses.   LORazepam 1 MG tablet Commonly known as:  ATIVAN Take 1 tablet (1 mg total) by mouth daily as needed for anxiety.   Melatonin 5 MG Tabs Place 5 mg into feeding tube at bedtime.   methylphenidate 5 MG tablet Commonly known as:  RITALIN Place 5 mg into feeding tube 2 (two) times daily.   tiZANidine 4 MG tablet Commonly known as:  ZANAFLEX Place 4 mg into feeding tube at bedtime.      Follow-up Information    [facility] Follow up.   Specialty:  Home Health Services Why:  [facility] [phone] Contact information: [place] [address] [phone]        Lawerance Cruel, MD. Schedule an appointment as soon as possible  for a visit in 1 week(s).   Specialty:  Family Medicine Contact information: [place] [address] [phone]        Melida Quitter, MD. Schedule an appointment as soon as possible for a visit.   Specialty:  Otolaryngology Why:  As needed Contact information: [address] [place] [address] [phone]          Allergies  Allergen Reactions   Cefepime     Urticarial rash - unclear cause, had been receiving cefepime for several days and had also had flagyl as well   Flagyl [Metronidazole]     Urticarial rash - unclear, was also receiving cefepime     Consultations:  CCM  ENT   Procedures/Studies: Ir Replc Gastro/colonic Tube Percut W/fluoro  Result Date: 10/26/2018 INDICATION: 73 year old male with chronic indwelling percutaneous gastrostomy tube. He presents today with a damaged and nonfunctioning tube. EXAM: GASTROSTOMY CATHETER REPLACEMENT MEDICATIONS: None ANESTHESIA/SEDATION: None CONTRAST:  5 mL Omnipaque 300-administered into the gastric lumen. FLUOROSCOPY TIME:  Fluoroscopy Time: 0 minutes 6 seconds (1 mGy). COMPLICATIONS: None immediate. PROCEDURE: Informed written consent was obtained from the patient after a thorough discussion of the procedural risks, benefits  and alternatives. All questions were addressed. Maximal Sterile Barrier Technique was utilized including caps, mask, sterile gowns, sterile gloves, sterile drape, hand hygiene and skin antiseptic. A timeout was performed prior to the initiation of the procedure. The balloon could not be deflated on the existing gastrostomy tube secondary to damage to the internal portions of the tube. Therefore, a 22 gauge needle was carefully advanced down along the tube tract and used to puncture the balloon directly aspirating the internal fluid. The tube was then successfully removed. A new 20 x 2.3 cm mini one Mickey G-tube was then lubricated in advanced through the existing  tract. The retention balloon was inflated with 10 mL saline. A hand injection of contrast material was performed confirming that the tube is within the gastric lumen. The tube was then flushed with saline. IMPRESSION: Successful exchange of damaged percutaneous gastrostomy tube. Please note that the new device implanted was the patient's own personal device brought from home. Electronically Signed   By: Jacqulynn Cadet M.D.   On: 10/26/2018 17:44   Dg Chest Port 1 View  Result Date: 10/26/2018 CLINICAL DATA:  Acute respiratory failure with hypoxia. EXAM: PORTABLE CHEST 1 VIEW COMPARISON:  Radiograph of October 25, 2018. FINDINGS: The heart size and mediastinal contours are within normal limits. No pneumothorax or pleural effusion is noted. Mild bibasilar subsegmental atelectasis is noted. The visualized skeletal structures are unremarkable. IMPRESSION: Mild bibasilar subsegmental atelectasis. Electronically Signed   By: Marijo Conception M.D.   On: 10/26/2018 16:32   Dg Chest Port 1 View  Result Date: 10/25/2018 CLINICAL DATA:  Shortness of breath EXAM: PORTABLE CHEST 1 VIEW COMPARISON:  10/23/2010 FINDINGS: Bilateral mild interstitial thickening. Bibasilar airspace disease likely reflecting atelectasis. No significant pleural effusions. No pneumothorax. Stable cardiomediastinal silhouette. No aggressive osseous lesion. IMPRESSION: Mild bilateral interstitial thickening likely reflecting mild interstitial edema. Bibasilar atelectasis. Electronically Signed   By: Kathreen Devoid   On: 10/25/2018 13:41   Dg Chest Port 1 View  Result Date: 10/23/2018 CLINICAL DATA:  Respiratory distress EXAM: PORTABLE CHEST 1 VIEW COMPARISON:  03/06/2018 FINDINGS: Normal heart size. Low lung volumes. Bibasilar atelectasis. No pneumothorax or pleural effusion. IMPRESSION: Bibasilar atelectasis. Electronically Signed   By: Marybelle Killings M.D.   On: 10/23/2018 10:00      Subjective: No issues today per patient. No  dyspnea.  Discharge Exam: Vitals:   10/27/18 1403 10/27/18 1436  BP: 132/68 (!) 147/91  Pulse: (!) 109 82  Resp: (!) 22 (!) 28  Temp: (!) 97.4 F (36.3 C) 98.6 F (37 C)  SpO2:  98%   Vitals:   10/27/18 0418 10/27/18 0554 10/27/18 1403 10/27/18 1436  BP: (!) 150/76 (!) 153/78 132/68 (!) 147/91  Pulse: 100 100 (!) 109 82  Resp: 20 18 (!) 22 (!) 28  Temp: 98.2 F (36.8 C) 98.3 F (36.8 C) (!) 97.4 F (36.3 C) 98.6 F (37 C)  TempSrc: Oral  Oral Oral  SpO2: 98% 100%  98%  Weight:      Height:        General: Pt is alert, awake, not in acute distress Cardiovascular: RRR, S1/S2 +, no rubs, no gallops Respiratory: CTA bilaterally, no wheezing, no rhonchi. Stridor initially that improved with ativan Abdominal: Soft, NT, ND, bowel sounds + Extremities: no edema, no cyanosis    The results of significant diagnostics from this hospitalization (including imaging, microbiology, ancillary and laboratory) are listed below for reference.     Microbiology: Recent Results (from the  past 240 hour(s))  Blood Culture (routine x 2)     Status: None (Preliminary result)   Collection Time: 10/23/18  9:09 AM  Result Value Ref Range Status   Specimen Description BLOOD LEFT WRIST  Final   Special Requests   Final    BOTTLES DRAWN AEROBIC AND ANAEROBIC Blood Culture adequate volume   Culture   Final    NO GROWTH 4 DAYS Performed at [facility], [address] [place], [place] [address]    Report Status PENDING  Incomplete  Blood Culture (routine x 2)     Status: None (Preliminary result)   Collection Time: 10/23/18  9:09 AM  Result Value Ref Range Status   Specimen Description BLOOD LEFT ANTECUBITAL  Final   Special Requests   Final    BOTTLES DRAWN AEROBIC AND ANAEROBIC Blood Culture results may not be optimal due to an excessive volume of blood received in culture bottles   Culture   Final    NO GROWTH 4 DAYS Performed at [facility], [place] [address],  [place], [place] [address]    Report Status PENDING  Incomplete  SARS Coronavirus 2 [facility] order, Performed in [place] hospital lab)     Status: None   Collection Time: 10/25/18  5:39 PM  Result Value Ref Range Status   SARS Coronavirus 2 NEGATIVE NEGATIVE Final    Comment: (NOTE) If result is NEGATIVE SARS-CoV-2 target nucleic acids are NOT DETECTED. The SARS-CoV-2 RNA is generally detectable in upper and lower  respiratory specimens during the acute phase of infection. The lowest  concentration of SARS-CoV-2 viral copies this assay can detect is 250  copies / mL. A negative result does not preclude SARS-CoV-2 infection  and should not be used as the sole basis for treatment or other  patient management decisions.  A negative result may occur with  improper specimen collection / handling, submission of specimen other  than nasopharyngeal swab, presence of viral mutation(s) within the  areas targeted by this assay, and inadequate number of viral copies  (<250 copies / mL). A negative result must be combined with clinical  observations, patient history, and epidemiological information. If result is POSITIVE SARS-CoV-2 target nucleic acids are DETECTED. The SARS-CoV-2 RNA is generally detectable in upper and lower  respiratory specimens dur ing the acute phase of infection.  Positive  results are indicative of active infection with SARS-CoV-2.  Clinical  correlation with patient history and other diagnostic information is  necessary to determine patient infection status.  Positive results do  not rule out bacterial infection or co-infection with other viruses. If result is PRESUMPTIVE POSTIVE SARS-CoV-2 nucleic acids MAY BE PRESENT.   A presumptive positive result was obtained on the submitted specimen  and confirmed on repeat testing.  While 2019 novel coronavirus  (SARS-CoV-2) nucleic acids may be present in the submitted sample  additional confirmatory testing may be necessary  for epidemiological  and / or clinical management purposes  to differentiate between  SARS-CoV-2 and other Sarbecovirus currently known to infect humans.  If clinically indicated additional testing with an alternate test  methodology [phone]) is advised. The SARS-CoV-2 RNA is generally  detectable in upper and lower respiratory sp ecimens during the acute  phase of infection. The expected result is Negative. Fact Sheet for Patients:  StrictlyIdeas.no Fact Sheet for Healthcare Providers: BankingDealers.co.za This test is not yet approved or cleared by the [place] FDA and has been authorized for detection and/or  diagnosis of SARS-CoV-2 by FDA under an Emergency Use Authorization (EUA).  This EUA will remain in effect (meaning this test can be used) for the duration of the COVID-19 declaration under Section 564(b)(1) of the Act, 21 U.S.C. section 360bbb-3(b)(1), unless the authorization is terminated or revoked sooner. Performed at [facility], [place] [address], [place], [place] [address]      Labs: BNP (last 3 results) Recent Labs    03/06/18 1130 10/23/18 0850  BNP 96.3 27.0   Basic Metabolic Panel: Recent Labs  Lab 10/23/18 0846 10/25/18 1256 10/25/18 1745 10/26/18 1846 10/27/18 0902  NA 137 137  --  142 144  K 4.1 4.5  --  4.6 4.0  CL 100 98  --  106 108  CO2 24 24  --  23 25  GLUCOSE 206* 296*  --  156* 146*  BUN 52* 65*  --  67* 66*  CREATININE 1.91* 2.03* 1.98* 1.97* 1.84*  CALCIUM 9.6 9.3  --  9.5 10.0   Liver Function Tests: Recent Labs  Lab 10/23/18 0846 10/25/18 1256  AST 18 29  ALT 17 16  ALKPHOS 73 72  BILITOT 0.6 0.4  PROT 7.4 8.1  ALBUMIN 3.4* 3.8   No results for input(s): LIPASE, AMYLASE in the last 168 hours. No results for input(s): AMMONIA in the last 168 hours. CBC: Recent Labs  Lab 10/23/18 0846 10/25/18 1256 10/25/18 1745  WBC 14.7* 18.9* 12.1*  NEUTROABS 12.3* 14.8*  --    HGB 10.1* 10.4* 9.8*  HCT 33.4* 33.6* 31.5*  MCV 83.5 85.7 84.7  PLT 249 296 251   Cardiac Enzymes: No results for input(s): CKTOTAL, CKMB, CKMBINDEX, TROPONINI in the last 168 hours. BNP: Invalid input(s): POCBNP CBG: No results for input(s): GLUCAP in the last 168 hours. D-Dimer No results for input(s): DDIMER in the last 72 hours. Hgb A1c No results for input(s): HGBA1C in the last 72 hours. Lipid Profile No results for input(s): CHOL, HDL, LDLCALC, TRIG, CHOLHDL, LDLDIRECT in the last 72 hours. Thyroid function studies No results for input(s): TSH, T4TOTAL, T3FREE, THYROIDAB in the last 72 hours.  Invalid input(s): FREET3 Anemia work up No results for input(s): VITAMINB12, FOLATE, FERRITIN, TIBC, IRON, RETICCTPCT in the last 72 hours. Urinalysis    Component Value Date/Time   COLORURINE STRAW (A) 01/26/2018 1227   APPEARANCEUR CLEAR 01/26/2018 1227   LABSPEC 1.018 01/26/2018 1227   PHURINE 7.0 01/26/2018 1227   GLUCOSEU 150 (A) 01/26/2018 1227   HGBUR SMALL (A) 01/26/2018 1227   BILIRUBINUR NEGATIVE 01/26/2018 1227   KETONESUR NEGATIVE 01/26/2018 1227   PROTEINUR 100 (A) 01/26/2018 1227   NITRITE NEGATIVE 01/26/2018 1227   LEUKOCYTESUR NEGATIVE 01/26/2018 1227   Sepsis Labs Invalid input(s): PROCALCITONIN,  WBC,  LACTICIDVEN Microbiology Recent Results (from the past 240 hour(s))  Blood Culture (routine x 2)     Status: None (Preliminary result)   Collection Time: 10/23/18  9:09 AM  Result Value Ref Range Status   Specimen Description BLOOD LEFT WRIST  Final   Special Requests   Final    BOTTLES DRAWN AEROBIC AND ANAEROBIC Blood Culture adequate volume   Culture   Final    NO GROWTH 4 DAYS Performed at [facility], [address] [place], [place] [address]    Report Status PENDING  Incomplete  Blood Culture (routine x 2)     Status: None (Preliminary result)   Collection Time: 10/23/18  9:09 AM  Result Value Ref  Range Status   Specimen  Description BLOOD LEFT ANTECUBITAL  Final   Special Requests   Final    BOTTLES DRAWN AEROBIC AND ANAEROBIC Blood Culture results may not be optimal due to an excessive volume of blood received in culture bottles   Culture   Final    NO GROWTH 4 DAYS Performed at [facility] [address], [place], [place] [address]    Report Status PENDING  Incomplete  SARS Coronavirus 2 [facility] order, Performed in [place] hospital lab)     Status: None   Collection Time: 10/25/18  5:39 PM  Result Value Ref Range Status   SARS Coronavirus 2 NEGATIVE NEGATIVE Final    Comment: (NOTE) If result is NEGATIVE SARS-CoV-2 target nucleic acids are NOT DETECTED. The SARS-CoV-2 RNA is generally detectable in upper and lower  respiratory specimens during the acute phase of infection. The lowest  concentration of SARS-CoV-2 viral copies this assay can detect is 250  copies / mL. A negative result does not preclude SARS-CoV-2 infection  and should not be used as the sole basis for treatment or other  patient management decisions.  A negative result may occur with  improper specimen collection / handling, submission of specimen other  than nasopharyngeal swab, presence of viral mutation(s) within the  areas targeted by this assay, and inadequate number of viral copies  (<250 copies / mL). A negative result must be combined with clinical  observations, patient history, and epidemiological information. If result is POSITIVE SARS-CoV-2 target nucleic acids are DETECTED. The SARS-CoV-2 RNA is generally detectable in upper and lower  respiratory specimens dur ing the acute phase of infection.  Positive  results are indicative of active infection with SARS-CoV-2.  Clinical  correlation with patient history and other diagnostic information is  necessary to determine patient infection status.  Positive results do  not rule out bacterial infection or co-infection with other viruses. If result is  PRESUMPTIVE POSTIVE SARS-CoV-2 nucleic acids MAY BE PRESENT.   A presumptive positive result was obtained on the submitted specimen  and confirmed on repeat testing.  While 2019 novel coronavirus  (SARS-CoV-2) nucleic acids may be present in the submitted sample  additional confirmatory testing may be necessary for epidemiological  and / or clinical management purposes  to differentiate between  SARS-CoV-2 and other Sarbecovirus currently known to infect humans.  If clinically indicated additional testing with an alternate test  methodology [phone]) is advised. The SARS-CoV-2 RNA is generally  detectable in upper and lower respiratory sp ecimens during the acute  phase of infection. The expected result is Negative. Fact Sheet for Patients:  StrictlyIdeas.no Fact Sheet for Healthcare Providers: BankingDealers.co.za This test is not yet approved or cleared by the [place] FDA and has been authorized for detection and/or diagnosis of SARS-CoV-2 by FDA under an Emergency Use Authorization (EUA).  This EUA will remain in effect (meaning this test can be used) for the duration of the COVID-19 declaration under Section 564(b)(1) of the Act, 21 U.S.C. section 360bbb-3(b)(1), unless the authorization is terminated or revoked sooner. Performed at [facility], [place] [address], [place], [place] [address]      Time coordinating discharge: Over 30 minutes  SIGNED:   Cordelia Poche, MD [facility] 10/27/2018, 2:37 PM

## 2018-10-27 NOTE — Progress Notes (Signed)
Adam Yang to be D/C'd Home per MD order.  Discussed prescriptions and follow up appointments with the patient. Prescriptions given to patient, medication list explained in detail. Pt verbalized understanding.  Allergies as of 10/27/2018      Reactions   Cefepime    Urticarial rash - unclear cause, had been receiving cefepime for several days and had also had flagyl as well   Flagyl [metronidazole]    Urticarial rash - unclear, was also receiving cefepime      Medication List    STOP taking these medications   artificial tears Oint ophthalmic ointment Commonly known as:  LACRILUBE   chlorhexidine 0.12 % solution Commonly known as:  PERIDEX   lisinopril 20 MG tablet Commonly known as:  ZESTRIL   nystatin 100000 UNIT/ML suspension Commonly known as:  MYCOSTATIN   polyethylene glycol 17 g packet Commonly known as:  MIRALAX / GLYCOLAX     TAKE these medications   amLODipine 5 MG tablet Commonly known as:  NORVASC Place 10 mg into feeding tube daily. What changed:  Another medication with the same name was removed. Continue taking this medication, and follow the directions you see here.   feeding supplement (GLUCERNA 1.5 CAL) Liqd Place 237 mLs into feeding tube 6 (six) times daily. What changed:    how much to take  when to take this   feeding supplement (PRO-STAT SUGAR FREE 64) Liqd Place 30 mLs into feeding tube 2 (two) times daily.   free water Soln Place 100 mLs into feeding tube 6 (six) times daily. With meals   free water Soln Place 300 mLs into feeding tube every 8 (eight) hours. Between meals   guaiFENesin 100 MG/5ML Soln Commonly known as:  ROBITUSSIN Place 5 mLs (100 mg total) into feeding tube 4 (four) times daily.   levofloxacin 25 MG/ML solution Commonly known as:  LEVAQUIN Place 30 mLs (750 mg total) into feeding tube every other day for 2 doses. Start taking on:  October 28, 2018   LORazepam 1 MG tablet Commonly known as:  ATIVAN Take 1 tablet  (1 mg total) by mouth daily as needed for anxiety.   Melatonin 5 MG Tabs Place 5 mg into feeding tube at bedtime.   methylphenidate 5 MG tablet Commonly known as:  RITALIN Place 5 mg into feeding tube 2 (two) times daily.   tiZANidine 4 MG tablet Commonly known as:  ZANAFLEX Place 4 mg into feeding tube at bedtime.            Durable Medical Equipment  (From admission, onward)         Start     Ordered   10/27/18 1147  For home use only DME Tube feeding pump  Once     10/27/18 1146   10/27/18 1115  For home use only DME oxygen  Once    Comments:  Pt's O2 sats on room air 87%. Pt will need humidifier and mask with nasal cannula.  Question Answer Comment  Mode or (Route) Nasal cannula   Liters per Minute 4   Frequency Continuous (stationary and portable oxygen unit needed)   Oxygen conserving device Yes   Oxygen delivery system Gas      10/27/18 1135          Vitals:   10/27/18 1403 10/27/18 1436  BP: 132/68 (!) 147/91  Pulse: (!) 109 82  Resp: (!) 22 (!) 28  Temp: (!) 97.4 F (36.3 C) 98.6 F (37 C)  SpO2:  98%    Skin clean, dry and intact without evidence of skin break down, no evidence of skin tears noted. IV catheter discontinued intact. Site without signs and symptoms of complications. Dressing and pressure applied. Pt denies pain at this time. No complaints noted.  An After Visit Summary was printed and given to the patient. Patient escorted via [place], and D/C home via private auto.  [name] 10/27/2018 6:31 PM

## 2018-10-27 NOTE — Discharge Instructions (Signed)
Adam Yang,  You were in the hospital because of difficulty breathing. This is because of your mucous and may be in the setting of a pneumonia. You will be discharged with antibiotics. Please follow-up with your primary care physician. You may want to follow up with Dr. Redmond Baseman for continued discussions with regard to possible tracheostomy in the future.

## 2018-10-28 LAB — CULTURE, BLOOD (ROUTINE X 2)
Culture: NO GROWTH
Culture: NO GROWTH
Special Requests: ADEQUATE

## 2018-10-31 DIAGNOSIS — J189 Pneumonia, unspecified organism: Secondary | ICD-10-CM | POA: Diagnosis not present

## 2018-10-31 DIAGNOSIS — J439 Emphysema, unspecified: Secondary | ICD-10-CM | POA: Diagnosis not present

## 2018-10-31 DIAGNOSIS — J9601 Acute respiratory failure with hypoxia: Secondary | ICD-10-CM | POA: Diagnosis not present

## 2018-10-31 DIAGNOSIS — N179 Acute kidney failure, unspecified: Secondary | ICD-10-CM | POA: Diagnosis not present

## 2018-11-01 DIAGNOSIS — R0902 Hypoxemia: Secondary | ICD-10-CM | POA: Diagnosis not present

## 2018-11-01 DIAGNOSIS — J9601 Acute respiratory failure with hypoxia: Secondary | ICD-10-CM | POA: Diagnosis not present

## 2018-11-01 DIAGNOSIS — R451 Restlessness and agitation: Secondary | ICD-10-CM | POA: Diagnosis not present

## 2018-11-01 DIAGNOSIS — R41840 Attention and concentration deficit: Secondary | ICD-10-CM | POA: Diagnosis not present

## 2018-11-01 MED FILL — INDOMETHACIN 50 MG CAPSULE: 50 | 10 days supply | Qty: 30 | Fill #0

## 2018-11-01 MED FILL — METHYLPHENIDATE HCL 5 MG TA: 5 | 30 days supply | Qty: 60 | Fill #0

## 2018-11-01 MED FILL — LORazepam 1 MG TABS: 1 | 30 days supply | Qty: 60 | Fill #0

## 2018-11-01 MED FILL — SILVADENE 1% CREAM: 1 | 50 days supply | Qty: 50 | Fill #0

## 2018-11-01 MED FILL — CHLORHEXIDINE 0.12% RINSE: 0.12 | 31 days supply | Qty: 473 | Fill #0

## 2018-11-14 DIAGNOSIS — E440 Moderate protein-calorie malnutrition: Secondary | ICD-10-CM | POA: Diagnosis not present

## 2018-11-14 DIAGNOSIS — I639 Cerebral infarction, unspecified: Secondary | ICD-10-CM | POA: Diagnosis not present

## 2018-11-14 DIAGNOSIS — E119 Type 2 diabetes mellitus without complications: Secondary | ICD-10-CM | POA: Diagnosis not present

## 2018-11-14 DIAGNOSIS — I69391 Dysphagia following cerebral infarction: Secondary | ICD-10-CM | POA: Diagnosis not present

## 2018-11-20 ENCOUNTER — Emergency Department (HOSPITAL_COMMUNITY): Payer: Medicare Other

## 2018-11-20 ENCOUNTER — Inpatient Hospital Stay (HOSPITAL_COMMUNITY)
Admission: EM | Admit: 2018-11-20 | Discharge: 2018-12-11 | DRG: 177 | Disposition: E | Payer: Medicare Other | Attending: Internal Medicine | Admitting: Internal Medicine

## 2018-11-20 ENCOUNTER — Encounter (HOSPITAL_COMMUNITY): Payer: Self-pay

## 2018-11-20 ENCOUNTER — Other Ambulatory Visit: Payer: Self-pay

## 2018-11-20 DIAGNOSIS — E46 Unspecified protein-calorie malnutrition: Secondary | ICD-10-CM | POA: Diagnosis not present

## 2018-11-20 DIAGNOSIS — Z79899 Other long term (current) drug therapy: Secondary | ICD-10-CM

## 2018-11-20 DIAGNOSIS — N179 Acute kidney failure, unspecified: Secondary | ICD-10-CM | POA: Diagnosis present

## 2018-11-20 DIAGNOSIS — R6520 Severe sepsis without septic shock: Secondary | ICD-10-CM | POA: Diagnosis not present

## 2018-11-20 DIAGNOSIS — B9620 Unspecified Escherichia coli [E. coli] as the cause of diseases classified elsewhere: Secondary | ICD-10-CM | POA: Diagnosis present

## 2018-11-20 DIAGNOSIS — Z808 Family history of malignant neoplasm of other organs or systems: Secondary | ICD-10-CM

## 2018-11-20 DIAGNOSIS — Z20828 Contact with and (suspected) exposure to other viral communicable diseases: Secondary | ICD-10-CM | POA: Diagnosis present

## 2018-11-20 DIAGNOSIS — I129 Hypertensive chronic kidney disease with stage 1 through stage 4 chronic kidney disease, or unspecified chronic kidney disease: Secondary | ICD-10-CM | POA: Diagnosis present

## 2018-11-20 DIAGNOSIS — Z66 Do not resuscitate: Secondary | ICD-10-CM | POA: Diagnosis present

## 2018-11-20 DIAGNOSIS — Z801 Family history of malignant neoplasm of trachea, bronchus and lung: Secondary | ICD-10-CM

## 2018-11-20 DIAGNOSIS — K4090 Unilateral inguinal hernia, without obstruction or gangrene, not specified as recurrent: Secondary | ICD-10-CM | POA: Diagnosis present

## 2018-11-20 DIAGNOSIS — I69191 Dysphagia following nontraumatic intracerebral hemorrhage: Secondary | ICD-10-CM

## 2018-11-20 DIAGNOSIS — R627 Adult failure to thrive: Secondary | ICD-10-CM | POA: Diagnosis present

## 2018-11-20 DIAGNOSIS — I69351 Hemiplegia and hemiparesis following cerebral infarction affecting right dominant side: Secondary | ICD-10-CM

## 2018-11-20 DIAGNOSIS — Z515 Encounter for palliative care: Secondary | ICD-10-CM | POA: Diagnosis present

## 2018-11-20 DIAGNOSIS — Z931 Gastrostomy status: Secondary | ICD-10-CM

## 2018-11-20 DIAGNOSIS — I69391 Dysphagia following cerebral infarction: Secondary | ICD-10-CM

## 2018-11-20 DIAGNOSIS — E785 Hyperlipidemia, unspecified: Secondary | ICD-10-CM | POA: Diagnosis present

## 2018-11-20 DIAGNOSIS — I69151 Hemiplegia and hemiparesis following nontraumatic intracerebral hemorrhage affecting right dominant side: Secondary | ICD-10-CM

## 2018-11-20 DIAGNOSIS — E119 Type 2 diabetes mellitus without complications: Secondary | ICD-10-CM

## 2018-11-20 DIAGNOSIS — R64 Cachexia: Secondary | ICD-10-CM | POA: Diagnosis present

## 2018-11-20 DIAGNOSIS — Z923 Personal history of irradiation: Secondary | ICD-10-CM

## 2018-11-20 DIAGNOSIS — D631 Anemia in chronic kidney disease: Secondary | ICD-10-CM | POA: Diagnosis present

## 2018-11-20 DIAGNOSIS — R1110 Vomiting, unspecified: Secondary | ICD-10-CM | POA: Diagnosis not present

## 2018-11-20 DIAGNOSIS — A419 Sepsis, unspecified organism: Secondary | ICD-10-CM

## 2018-11-20 DIAGNOSIS — I69120 Aphasia following nontraumatic intracerebral hemorrhage: Secondary | ICD-10-CM

## 2018-11-20 DIAGNOSIS — Z85819 Personal history of malignant neoplasm of unspecified site of lip, oral cavity, and pharynx: Secondary | ICD-10-CM | POA: Diagnosis present

## 2018-11-20 DIAGNOSIS — J9621 Acute and chronic respiratory failure with hypoxia: Secondary | ICD-10-CM | POA: Diagnosis present

## 2018-11-20 DIAGNOSIS — J690 Pneumonitis due to inhalation of food and vomit: Principal | ICD-10-CM | POA: Diagnosis present

## 2018-11-20 DIAGNOSIS — Z85818 Personal history of malignant neoplasm of other sites of lip, oral cavity, and pharynx: Secondary | ICD-10-CM

## 2018-11-20 DIAGNOSIS — Z87891 Personal history of nicotine dependence: Secondary | ICD-10-CM

## 2018-11-20 DIAGNOSIS — R0902 Hypoxemia: Secondary | ICD-10-CM | POA: Diagnosis not present

## 2018-11-20 DIAGNOSIS — J9601 Acute respiratory failure with hypoxia: Secondary | ICD-10-CM | POA: Diagnosis not present

## 2018-11-20 DIAGNOSIS — Z9221 Personal history of antineoplastic chemotherapy: Secondary | ICD-10-CM

## 2018-11-20 DIAGNOSIS — I10 Essential (primary) hypertension: Secondary | ICD-10-CM | POA: Diagnosis present

## 2018-11-20 DIAGNOSIS — E1122 Type 2 diabetes mellitus with diabetic chronic kidney disease: Secondary | ICD-10-CM | POA: Diagnosis present

## 2018-11-20 DIAGNOSIS — Z6821 Body mass index (BMI) 21.0-21.9, adult: Secondary | ICD-10-CM

## 2018-11-20 DIAGNOSIS — F40240 Claustrophobia: Secondary | ICD-10-CM | POA: Diagnosis present

## 2018-11-20 DIAGNOSIS — N183 Chronic kidney disease, stage 3 (moderate): Secondary | ICD-10-CM | POA: Diagnosis present

## 2018-11-20 DIAGNOSIS — E1129 Type 2 diabetes mellitus with other diabetic kidney complication: Secondary | ICD-10-CM | POA: Diagnosis present

## 2018-11-20 LAB — URINALYSIS, ROUTINE W REFLEX MICROSCOPIC
Bilirubin Urine: NEGATIVE
Glucose, UA: 50 mg/dL — AB
Ketones, ur: NEGATIVE mg/dL
Leukocytes,Ua: NEGATIVE
Nitrite: POSITIVE — AB
Protein, ur: 100 mg/dL — AB
Specific Gravity, Urine: 1.014 (ref 1.005–1.030)
pH: 6 (ref 5.0–8.0)

## 2018-11-20 LAB — COMPREHENSIVE METABOLIC PANEL
ALT: 20 U/L (ref 0–44)
AST: 37 U/L (ref 15–41)
Albumin: 2.4 g/dL — ABNORMAL LOW (ref 3.5–5.0)
Alkaline Phosphatase: 70 U/L (ref 38–126)
Anion gap: 18 — ABNORMAL HIGH (ref 5–15)
BUN: 83 mg/dL — ABNORMAL HIGH (ref 8–23)
CO2: 26 mmol/L (ref 22–32)
Calcium: 9 mg/dL (ref 8.9–10.3)
Chloride: 93 mmol/L — ABNORMAL LOW (ref 98–111)
Creatinine, Ser: 3.28 mg/dL — ABNORMAL HIGH (ref 0.61–1.24)
GFR calc Af Amer: 21 mL/min — ABNORMAL LOW (ref >60)
GFR calc non Af Amer: 18 mL/min — ABNORMAL LOW (ref >60)
Glucose, Bld: 157 mg/dL — ABNORMAL HIGH (ref 70–99)
Potassium: 4.2 mmol/L (ref 3.5–5.1)
Sodium: 137 mmol/L (ref 135–145)
Total Bilirubin: 0.6 mg/dL (ref 0.3–1.2)
Total Protein: 7.1 g/dL (ref 6.5–8.1)

## 2018-11-20 LAB — POCT I-STAT 7, (LYTES, BLD GAS, ICA,H+H)
Acid-Base Excess: 4 mmol/L — ABNORMAL HIGH (ref 0.0–2.0)
Bicarbonate: 28.2 mmol/L — ABNORMAL HIGH (ref 20.0–28.0)
Calcium, Ion: 1.13 mmol/L — ABNORMAL LOW (ref 1.15–1.40)
HCT: 41 % (ref 39.0–52.0)
Hemoglobin: 13.9 g/dL (ref 13.0–17.0)
O2 Saturation: 98 %
Patient temperature: 102.2
Potassium: 4.1 mmol/L (ref 3.5–5.1)
Sodium: 136 mmol/L (ref 135–145)
TCO2: 29 mmol/L (ref 22–32)
pCO2 arterial: 43.7 mmHg (ref 32.0–48.0)
pH, Arterial: 7.425 (ref 7.350–7.450)
pO2, Arterial: 109 mmHg — ABNORMAL HIGH (ref 83.0–108.0)

## 2018-11-20 LAB — POCT I-STAT EG7
Acid-Base Excess: 4 mmol/L — ABNORMAL HIGH (ref 0.0–2.0)
Bicarbonate: 27.4 mmol/L (ref 20.0–28.0)
Calcium, Ion: 1.06 mmol/L — ABNORMAL LOW (ref 1.15–1.40)
HCT: 27 % — ABNORMAL LOW (ref 39.0–52.0)
Hemoglobin: 9.2 g/dL — ABNORMAL LOW (ref 13.0–17.0)
O2 Saturation: 91 %
Potassium: 4.2 mmol/L (ref 3.5–5.1)
Sodium: 137 mmol/L (ref 135–145)
TCO2: 28 mmol/L (ref 22–32)
pCO2, Ven: 36.9 mmHg — ABNORMAL LOW (ref 44.0–60.0)
pH, Ven: 7.479 — ABNORMAL HIGH (ref 7.250–7.430)
pO2, Ven: 55 mmHg — ABNORMAL HIGH (ref 32.0–45.0)

## 2018-11-20 LAB — CBC WITH DIFFERENTIAL/PLATELET
Abs Immature Granulocytes: 0 10*3/uL (ref 0.00–0.07)
Basophils Absolute: 0 10*3/uL (ref 0.0–0.1)
Basophils Relative: 0 %
Eosinophils Absolute: 0 10*3/uL (ref 0.0–0.5)
Eosinophils Relative: 0 %
HCT: 28.9 % — ABNORMAL LOW (ref 39.0–52.0)
Hemoglobin: 8.9 g/dL — ABNORMAL LOW (ref 13.0–17.0)
Lymphocytes Relative: 5 %
Lymphs Abs: 0.3 10*3/uL — ABNORMAL LOW (ref 0.7–4.0)
MCH: 26.1 pg (ref 26.0–34.0)
MCHC: 30.8 g/dL (ref 30.0–36.0)
MCV: 84.8 fL (ref 80.0–100.0)
Monocytes Absolute: 0.3 10*3/uL (ref 0.1–1.0)
Monocytes Relative: 4 %
Neutro Abs: 6.3 10*3/uL (ref 1.7–7.7)
Neutrophils Relative %: 91 %
Platelets: 200 10*3/uL (ref 150–400)
RBC: 3.41 MIL/uL — ABNORMAL LOW (ref 4.22–5.81)
RDW: 18.2 % — ABNORMAL HIGH (ref 11.5–15.5)
WBC: 6.9 10*3/uL (ref 4.0–10.5)
nRBC: 0 % (ref 0.0–0.2)
nRBC: 1 /100 WBC — ABNORMAL HIGH

## 2018-11-20 LAB — SARS CORONAVIRUS 2 BY RT PCR (HOSPITAL ORDER, PERFORMED IN ~~LOC~~ HOSPITAL LAB): SARS Coronavirus 2: NEGATIVE

## 2018-11-20 LAB — LACTIC ACID, PLASMA: Lactic Acid, Venous: 2.8 mmol/L (ref 0.5–1.9)

## 2018-11-20 MED ORDER — CLINDAMYCIN PHOSPHATE 600 MG/50ML IV SOLN
600.0000 mg | Freq: Once | INTRAVENOUS | Status: AC
  Administered 2018-11-20: 600 mg via INTRAVENOUS
  Filled 2018-11-20: qty 50

## 2018-11-20 MED ORDER — MORPHINE SULFATE (PF) 2 MG/ML IV SOLN
1.0000 mg | INTRAVENOUS | Status: DC | PRN
  Administered 2018-11-20 – 2018-11-21 (×3): 1 mg via INTRAVENOUS
  Filled 2018-11-20 (×3): qty 1

## 2018-11-20 MED ORDER — SODIUM CHLORIDE 0.9 % IV SOLN
2.0000 g | Freq: Once | INTRAVENOUS | Status: DC
  Filled 2018-11-20: qty 2

## 2018-11-20 MED ORDER — SODIUM CHLORIDE 0.9 % IV SOLN
2.0000 g | Freq: Once | INTRAVENOUS | Status: AC
  Administered 2018-11-20: 2 g via INTRAVENOUS
  Filled 2018-11-20: qty 2

## 2018-11-20 MED ORDER — ACETAMINOPHEN 500 MG PO TABS
1000.0000 mg | ORAL_TABLET | Freq: Once | ORAL | Status: AC
  Administered 2018-11-20: 1000 mg
  Filled 2018-11-20: qty 2

## 2018-11-20 MED ORDER — GLYCOPYRROLATE 0.2 MG/ML IJ SOLN
0.2000 mg | INTRAMUSCULAR | Status: DC | PRN
  Administered 2018-11-20 – 2018-11-22 (×6): 0.2 mg via INTRAVENOUS
  Filled 2018-11-20 (×6): qty 1

## 2018-11-20 MED ORDER — DIPHENHYDRAMINE HCL 50 MG/ML IJ SOLN
12.5000 mg | Freq: Four times a day (QID) | INTRAMUSCULAR | Status: DC | PRN
  Administered 2018-11-20 – 2018-11-22 (×2): 12.5 mg via INTRAVENOUS
  Filled 2018-11-20 (×3): qty 1

## 2018-11-20 MED ORDER — VANCOMYCIN HCL IN DEXTROSE 1-5 GM/200ML-% IV SOLN
1000.0000 mg | Freq: Once | INTRAVENOUS | Status: AC
  Administered 2018-11-20: 1000 mg via INTRAVENOUS
  Filled 2018-11-20: qty 200

## 2018-11-20 NOTE — Progress Notes (Signed)
Pharmacy Antibiotic Note  Adam Yang is a 73 y.o. male admitted on 11/21/2018 with SOB/HCAP/sepsis.  Pharmacy has been consulted for Vancomycin and Zosyn dosing.  Vancomycin 1 g IV given in ED at  2130  Plan: Vancomycin 750 mg IV q48h Zosyn 3.375 g IV q8h   Height: 5\' 9"  (175.3 cm) Weight: 156 lb 8.4 oz (71 kg) IBW/kg (Calculated) : 70.7  Temp (24hrs), Avg:102.2 F (39 C), Min:102.2 F (39 C), Max:102.2 F (39 C)  Recent Labs  Lab 11/27/2018 2110  WBC 6.9  CREATININE 3.28*  LATICACIDVEN 2.8*    Estimated Creatinine Clearance: 21 mL/min (A) (by C-G formula based on SCr of 3.28 mg/dL (H)).    Allergies  Allergen Reactions  . Cefepime     Urticarial rash - unclear cause, had been receiving cefepime for several days and had also had flagyl as well  . Flagyl [Metronidazole]     Urticarial rash - unclear, was also receiving cefepime     Caryl Pina 11/15/2018 11:43 PM

## 2018-11-20 NOTE — ED Notes (Signed)
NT suctioned pt 2-3 times for copious secretions. Pt w/ noted gurgling in throat, which was improved after suctioning. Pt desatted to 74% during suctioning despite blowby from NRB. NRB replaced post suctioning and pt improved to 98%.

## 2018-11-20 NOTE — ED Provider Notes (Signed)
[facility] EMERGENCY DEPARTMENT Provider Note   CSN: [id number] Arrival date & time: 11/18/2018  2009    History   Chief Complaint Chief Complaint  Patient presents with   Shortness of Breath    HPI Adam Yang is a 73 y.o. male. 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 Patient non-verbal but will nod head to questions. Wife states he has been having emesis over the past three days and then started having SOB and hypoxia requiring increased O2 at home.      HPI  Past Medical History:  Diagnosis Date   Anxiety    Claustrophobia   Arthritis    Cancer ([place])    nasopharynx   Chronic cough    EXCESS MUCUS BUILDUP SINCE ORAL SURGERY FROM CANCER    Chronic kidney disease    Renal Insuffiency , Creatinine has been in the 2- 3 range, 11/27/14 Creatine 1.4   Diabetes mellitus without complication ([place])    Type II   Gout    History of radiation therapy 12/23/2014- 02/10/2015   Nasopharynx / Neck Adenopathy   Hyperlipidemia    Hypertension    Inguinal hernia    Right inguinal hernia 08/23/2017    Patient Active Problem List   Diagnosis Date Noted   Palliative care encounter    Pneumonia 10/25/2018   Palliative care patient    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   Emphysema of lung ([place]) 03/22/2018   CVA (cerebrovascular accident due to intracerebral hemorrhage) ([place]) 03/05/2018   Acute respiratory failure with hypoxia ([place]) 03/05/2018   Hypotension 03/05/2018   Hyperlipidemia associated with type 2 diabetes mellitus ([place]) 03/05/2018   Copious oral secretions    Goals of care, counseling/discussion    Palliative care by specialist    Malnutrition of moderate degree 02/24/2018   Pneumonia of both lungs due to infectious organism 02/23/2018   Pneumonia due to  infectious organism 02/23/2018   Aspiration pneumonia ([place]) 02/22/2018   Aspiration pneumonia of right lower lobe due to milk (HCC)    Dysphasia    Anemia of chronic disease    Hypernatremia    Transaminitis    Hypertensive emergency 02/02/2018   Intraparenchymal hemorrhage of brain ([place]) 02/02/2018   Hemiparesis affecting right side as late effect of stroke (HCC)    Aphasia, post-stroke    Dysphagia, post-stroke    Nontraumatic subcortical hemorrhage of left cerebral hemisphere [facility])    Diabetes mellitus type 2 in nonobese ([place])    AKI (acute kidney injury) ([place])    Hyponatremia    Chronic kidney disease    Cytotoxic brain edema ([place]) 01/28/2018   Brain herniation ([place]) 01/28/2018   IVH (intraventricular hemorrhage) ([place]) 01/27/2018   Right inguinal hernia 08/23/2017   Chronic kidney disease, stage III (moderate) ([place]) 02/18/2017   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 ([place]) 06/18/2016   Other fatigue 06/18/2016   Essential hypertension 10/17/2015   Constipation 02/06/2015   Drug-induced skin rash 02/03/2015   Mucositis due to chemotherapy 01/24/2015   Hyperglycemia due to type 2 diabetes mellitus ([place]) 01/07/2015   Chronic anxiety 12/19/2014   Stage 2 chronic renal impairment associated with type 2 diabetes mellitus ([place]) 12/19/2014   Type 2 diabetes mellitus, controlled, with renal complications ([place]) 58/85/0277   Status post insertion of percutaneous endoscopic gastrostomy (PEG) tube ([place]) 12/19/2014  History of nasopharyngeal cancer 11/26/2014    Past Surgical History:  Procedure Laterality Date   colonoscopy     COLONOSCOPY W/ POLYPECTOMY     INGUINAL HERNIA REPAIR Right 08/23/2017   Procedure: OPEN REPAIR RIGHT INGUINAL HERNIA WITH MESH;  Surgeon: Fanny Skates, MD;  Location: [facility];  Service: General;  Laterality: Right;  GENERAL AND TAP BLOCK   INSERTION OF MESH Right 08/23/2017   Procedure:  INSERTION OF MESH;  Surgeon: Fanny Skates, MD;  Location: [facility];  Service: General;  Laterality: Right;  GENERAL AND TAP BLOCK   IR GASTROSTOMY TUBE MOD SED  02/06/2018   IR [place] GASTRO/COLONIC TUBE PERCUT W/FLUORO  04/10/2018   IR [place] GASTRO/COLONIC TUBE PERCUT W/FLUORO  10/26/2018   KNEE SURGERY Left 1990's   tibia crushed, pinned and bone grafted   LYMPH NODE BIOPSY     MULTIPLE EXTRACTIONS WITH ALVEOLOPLASTY N/A 12/06/2014   Procedure: Extraction of tooth #'s 2,4,5,6,7,8,9,10,11,12,13,14,15, 20,22,26,27,28,29 with alveoloplasty.;  Surgeon: Lenn Cal, DDS;  Location: [facility];  Service: Oral Surgery;  Laterality: N/A;        Home Medications    Prior to Admission medications   Medication Sig Start Date End Date Taking? Authorizing Provider  Amino Acids-Protein Hydrolys (FEEDING SUPPLEMENT, PRO-STAT SUGAR FREE 64,) LIQD Place 30 mLs into feeding tube 2 (two) times daily. 10/27/18   Mariel Aloe, MD  amLODipine (NORVASC) 5 MG tablet Place 10 mg into feeding tube daily.  09/28/18   [provider]  guaiFENesin (ROBITUSSIN) 100 MG/5ML SOLN Place 5 mLs (100 mg total) into feeding tube 4 (four) times daily. 10/27/18   Mariel Aloe, MD  LORazepam (ATIVAN) 1 MG tablet Take 1 tablet (1 mg total) by mouth daily as needed for anxiety. 10/27/18   Mariel Aloe, MD  Melatonin 5 MG TABS Place 5 mg into feeding tube at bedtime.    [provider]  methylphenidate (RITALIN) 5 MG tablet Place 5 mg into feeding tube 2 (two) times daily.    [provider]  Nutritional Supplements (FEEDING SUPPLEMENT, GLUCERNA 1.5 CAL,) LIQD Place 237 mLs into feeding tube 6 (six) times daily. 10/27/18   Mariel Aloe, MD  tiZANidine (ZANAFLEX) 4 MG tablet Place 4 mg into feeding tube at bedtime.  12/12/17   [provider]  Water For Irrigation, Sterile (FREE WATER) SOLN Place 100 mLs into feeding tube 6 (six) times daily. With meals 10/27/18   Mariel Aloe, MD    Water For Irrigation, Sterile (FREE WATER) SOLN Place 300 mLs into feeding tube every 8 (eight) hours. Between meals 10/27/18   Mariel Aloe, MD    Family History Family History  Problem Relation Age of Onset   Cancer Mother        throat ca   Cancer Father        lung ca   Cancer Brother        throat ca   Heart block Brother     Social History Social History   Tobacco Use   Smoking status: Former Smoker    Packs/day: 1.00    Years: 25.00    Pack years: 25.00    Last attempt to quit: 07/12/1998    Years since quitting: 20.3   Smokeless tobacco: Never Used  Substance Use Topics   Alcohol use: No   Drug use: No     Allergies   Cefepime and Flagyl [metronidazole]   Review of Systems Review of Systems  Constitutional: Positive for appetite change, chills and fever.  HENT: Negative for ear pain and sore throat.   Eyes: Negative for pain and visual disturbance.  Respiratory: Positive for shortness of breath.   Cardiovascular: Negative for chest pain and palpitations.  Gastrointestinal: Positive for abdominal pain and vomiting.  Genitourinary: Negative for dysuria and hematuria.  Musculoskeletal: Negative for arthralgias and back pain.  Skin: Negative for color change and rash.  Neurological: Negative for seizures and syncope.  All other systems reviewed and are negative.    Physical Exam Updated Vital Signs BP (!) 148/84    Pulse (!) 118    Temp 98.7 F (37.1 C) (Axillary)    Resp (!) 26    Ht 5\' 9"  (1.753 m)    Wt 64.6 kg    SpO2 95%    BMI 21.03 kg/m   Physical Exam Vitals signs and nursing note reviewed.  Constitutional:      General: He is in acute distress.     Appearance: He is ill-appearing and toxic-appearing.  HENT:     Head: Normocephalic and atraumatic.  Eyes:     Conjunctiva/sclera: Conjunctivae normal.  Neck:     Musculoskeletal: Neck supple.  Cardiovascular:     Rate and Rhythm: Regular rhythm. Tachycardia present.  Pulmonary:      Effort: Respiratory distress present.     Breath sounds: Rhonchi and rales present.  Abdominal:     Palpations: Abdomen is soft.     Tenderness: There is abdominal tenderness (generalized).  Skin:    General: Skin is warm and dry.  Neurological:     Comments: Baseline aphasia and right sided deficits      ED Treatments / Results  Labs (all labs ordered are listed, but only abnormal results are displayed) Labs Reviewed  LACTIC ACID, PLASMA - Abnormal; Notable for the following components:      Result Value   Lactic Acid, Venous 2.8 (*)    All other components within normal limits  COMPREHENSIVE METABOLIC PANEL - Abnormal; Notable for the following components:   Chloride 93 (*)    Glucose, Bld 157 (*)    BUN 83 (*)    Creatinine, Ser 3.28 (*)    Albumin 2.4 (*)    GFR calc non Af Amer 18 (*)    GFR calc Af Amer 21 (*)    Anion gap 18 (*)    All other components within normal limits  CBC WITH DIFFERENTIAL/PLATELET - Abnormal; Notable for the following components:   RBC 3.41 (*)    Hemoglobin 8.9 (*)    HCT 28.9 (*)    RDW 18.2 (*)    Lymphs Abs 0.3 (*)    nRBC 1 (*)    All other components within normal limits  URINALYSIS, ROUTINE W REFLEX MICROSCOPIC - Abnormal; Notable for the following components:   APPearance CLOUDY (*)    Glucose, UA 50 (*)    Hgb urine dipstick MODERATE (*)    Protein, ur 100 (*)    Nitrite POSITIVE (*)    Bacteria, UA MANY (*)    All other components within normal limits  BASIC METABOLIC PANEL - Abnormal; Notable for the following components:   Glucose, Bld 168 (*)    BUN 79 (*)    Creatinine, Ser 3.18 (*)    Calcium 8.5 (*)    GFR calc non Af Amer 19 (*)    GFR calc Af Amer 22 (*)    All other components within normal  limits  CBC - Abnormal; Notable for the following components:   RBC 3.01 (*)    Hemoglobin 7.7 (*)    HCT 25.3 (*)    MCH 25.6 (*)    RDW 18.4 (*)    nRBC 0.4 (*)    All other components within normal limits   GLUCOSE, CAPILLARY - Abnormal; Notable for the following components:   Glucose-Capillary 157 (*)    All other components within normal limits  POCT I-STAT 7, (LYTES, BLD GAS, ICA,H+H) - Abnormal; Notable for the following components:   pO2, Arterial 109.0 (*)    Bicarbonate 28.2 (*)    Acid-Base Excess 4.0 (*)    Calcium, Ion 1.13 (*)    All other components within normal limits  POCT I-STAT EG7 - Abnormal; Notable for the following components:   pH, Ven 7.479 (*)    pCO2, Ven 36.9 (*)    pO2, Ven 55.0 (*)    Acid-Base Excess 4.0 (*)    Calcium, Ion 1.06 (*)    HCT 27.0 (*)    Hemoglobin 9.2 (*)    All other components within normal limits  CULTURE, BLOOD (ROUTINE X 2)  CULTURE, BLOOD (ROUTINE X 2)  SARS CORONAVIRUS 2 (HOSPITAL ORDER, [facility])  EXPECTORATED SPUTUM ASSESSMENT W REFEX TO RESP CULTURE  RESPIRATORY PANEL BY PCR  CULTURE, RESPIRATORY  SARS CORONAVIRUS 2 (HOSPITAL ORDER, PERFORMED IN [facility])  URINE CULTURE  NOVEL CORONAVIRUS, NAA (HOSPITAL ORDER, SEND-OUT TO REF LAB)  BLOOD CULTURE ID PANEL (REFLEXED)  LACTIC ACID, PLASMA  I-STAT ARTERIAL BLOOD GAS, ED    EKG EKG Interpretation  Date/Time:  Monday Nov 20 2018 20:31:12 EDT Ventricular Rate:  138 PR Interval:    QRS Duration: 87 QT Interval:  292 QTC Calculation: 443 R Axis:   -4 Text Interpretation:  Sinus tachycardia Left atrial enlargement Nonspecific repol abnormality, lateral leads No acute changes Confirmed by Merrily Pew [id number]) on 11/23/2018 8:40:25 PM   Radiology Dg Abdomen 1 View  Result Date: 12/03/2018 CLINICAL DATA:  Hypoxia and shortness of breath. EXAM: ABDOMEN - 1 VIEW COMPARISON:  None. FINDINGS: The bowel gas pattern is normal. No radio-opaque calculi or other significant radiographic abnormality are seen. IMPRESSION: Negative. Electronically Signed   By: Ulyses Jarred M.D.   On: 11/14/2018 21:15   Dg Chest Port 1 View  Result Date:  11/24/2018 CLINICAL DATA:  Hypoxia, shortness of breath. EXAM: PORTABLE CHEST 1 VIEW COMPARISON:  10/26/2018 FINDINGS: Normal heart size. Bilateral pleural effusions with diminished aeration to the lung bases noted. Pulmonary vascular congestion noted. Visualized osseous structures are unremarkable. IMPRESSION: 1. Congestive heart failure. 2. Diminished aeration to both lung bases. Electronically Signed   By: Kerby Moors M.D.   On: 11/15/2018 21:16    Procedures Procedures (including critical care time)  Medications Ordered in ED Medications  morphine 2 MG/ML injection 1 mg (1 mg Intravenous Given 11/21/18 1044)  glycopyrrolate (ROBINUL) injection 0.2 mg (0.2 mg Intravenous Given 11/21/18 1208)  diphenhydrAMINE (BENADRYL) injection 12.5 mg (12.5 mg Intravenous Given 11/19/2018 2349)  vancomycin (VANCOCIN) IVPB 750 mg/150 ml premix (has no administration in time range)  piperacillin-tazobactam (ZOSYN) IVPB 3.375 g (3.375 g Intravenous New Bag/Given 11/21/18 0509)  free water 100 mL (100 mLs Per Tube Given 11/21/18 1046)  free water 300 mL (300 mLs Per Tube Given 11/21/18 1046)  feeding supplement (PRO-STAT SUGAR FREE 64) liquid 30 mL (30 mLs Per Tube Given 11/21/18 1029)  amLODipine (  NORVASC) tablet 10 mg (10 mg Per Tube Given 11/21/18 1029)  methylphenidate (RITALIN) tablet 5 mg (5 mg Per Tube Given 11/21/18 1029)  acetaminophen (TYLENOL) tablet 650 mg (has no administration in time range)    Or  acetaminophen (TYLENOL) suppository 650 mg (has no administration in time range)  ondansetron (ZOFRAN) tablet 4 mg (has no administration in time range)    Or  ondansetron (ZOFRAN) injection 4 mg (has no administration in time range)  enoxaparin (LOVENOX) injection 30 mg (30 mg Subcutaneous Given 11/21/18 1030)  feeding supplement (ENSURE ENLIVE) (ENSURE ENLIVE) liquid 237 mL (237 mLs Per Tube Given 11/21/18 1030)  0.9 %  sodium chloride infusion (500 mLs Intravenous New Bag/Given 11/21/18 0410)  vancomycin  (VANCOCIN) IVPB 1000 mg/200 mL premix (0 mg Intravenous Stopped 11/17/2018 2231)  clindamycin (CLEOCIN) IVPB 600 mg (0 mg Intravenous Stopped 11/17/2018 2214)  aztreonam (AZACTAM) 2 g in sodium chloride 0.9 % 100 mL IVPB (0 g Intravenous Stopped 11/29/2018 2258)  acetaminophen (TYLENOL) tablet 1,000 mg (1,000 mg Per Tube Given 11/21/2018 2234)     Initial Impression / Assessment and Plan / ED Course  I have reviewed the triage vital signs and the nursing notes.  Pertinent labs & imaging results that were available during my care of the patient were reviewed by me and considered in my medical decision making (see chart for details).        Adam Yang is a 72 y.o. male who presents to the ED with hypoxia and fever, concerning for sepsis 2/2 aspiration PNA vs Urinary source.   Multiple labs drawn on the patient. Patient with vital sign abnormalities including severe hypoxia requiring NRB 15L to maintain sats and lab abnormalities, c/w sepsis. Antibiotics started on the patient. Fluid resuscitation started on the patient.   Multiple discussions had with patients wife (patient aphasic from prior stroke), about the need to intubate. Wife unsure at this time as he has previously been a DNR. ICU team was contacted for admission and came down and spoke with the wife. Ultimately wife decided to proceed with a DNR. Patient admitted to hospitalist.   Final Clinical Impressions(s) / ED Diagnoses   Final diagnoses:  Sepsis, due to unspecified organism, unspecified whether acute organ dysfunction present [facility])  Acute respiratory failure with hypoxia [facility])    ED Discharge Orders    None       Doneta Public, MD 11/21/18 1230    Mesner, Corene Cornea, MD 30-Nov-2018 3149

## 2018-11-20 NOTE — ED Triage Notes (Signed)
Pt arrives POV for eval of ongoing SOB, lethargy and weakness. Pt was seen here 4/13 for similar. Pt tachycardic to 150s on arrival, resp rate 40s. Pt is more confused than usual per family. 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 Pt is pale and clammy, responsive to voice. Hx of CA, not currently immunosuppressed per famiyl

## 2018-11-20 NOTE — Progress Notes (Signed)
Patient arrived with spo2 70% on 6L [place]. RT placed patient on 100% non rebreather. Spo2 currently 98%.

## 2018-11-20 NOTE — Consult Note (Signed)
NAME:  Adam Yang, MRN:  [id number], DOB:  [birth year], LOS: 0 ADMISSION DATE:  11/24/2018, CONSULTATION DATE:  12/09/2018 REFERRING MD:  Mesner  CHIEF COMPLAINT:  SOB   Brief History   Adam Yang is a 73 y.o. male who presented 5/11 with dyspnea and hypoxic respiratory failure.  History of poor airway management / secretion clearance / mucous plugging.  PCCM called for concerns pt would require intubation.  Code status remains DNR after discussions with pt and wife.  History of present illness   Adam Yang is a 73 y.o. 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  He presented to [facility] ED 5/11 with dyspnea .  Found to have sats of 70% on 6L [place] with improvement to 100% on NRB.    He was also found to have copious secretions requiring frequent suctioning.    Denies any recent fevers/chills/sweats, chest pain, cough, N/V/D, abd pain, myalgias.  COVID testing was negative; however, pt is certainly high risk given recent hospital presentation, exposure to daughter who has been sick (not tested for COVID), and recent home health PT / OT.  Of note, he has had multiple previous presentations / admissions for the same (poor airway / secretion management).  He was evaluated just one month ago by our team for this.  It was recommended that if he had continued symptoms, then comfort measures should be considered.  He was seen by palliative medicine but family was not interested in palliation / hospice; however, were in agreement to continue with DNR status with full supportive measures.  Due to recurrent poor airway management this presentation, PCCM was asked to see in consultation in case he were to require intubation.  EDP had discussed intubation with pt and family who had agreed initially.  After further discussions with Dr.  Gilford Raid regarding what intubation entails and ramifications with high likelihood of pt requiring tracheostomy due to recurrent secretions / plugging, family has decided to continue with DNR status and not pursue intubation.   Past Medical History  Stage III nasopharyngeal CA s/p chemo and radiation, ICH, HTn, HLD, DM, CKD, anxiety.  Significant Hospital Events   5/11 > admit.  Consults:  PCCM.  Procedures:  None.  Significant Diagnostic Tests:  CXR 5/11 > no acute process.  Micro Data:  Blood 5/11 >  Sputum 5/11 >  RVP 5/11 >  SARS CoV2 5/11 > neg.  Antimicrobials:  Vanc 5/11 >  Zosyn 5/11 >    Interim history/subjective:  On NRB, comfortable.  Follows basic commands but not verbal.  Objective:  Blood pressure (!) 153/78, pulse (!) 118, temperature (!) 102.2 F (39 C), temperature source Axillary, resp. rate (!) 26, height 5\' 9"  (1.753 m), weight 71 kg, SpO2 98 %.    FiO2 (%):  [100 %] 100 %   Intake/Output Summary (Last 24 hours) at 11/10/2018 2328 Last data filed at 11/10/2018 2258 Gross per 24 hour  Intake 368.33 ml  Output -  Net 368.33 ml   Filed Weights   12/06/2018 2106  Weight: 71 kg    Examination: General: Chronically ill appearing male, in NAD. Neuro: Awake, alert, follows commands, not verbal.  Moving LLE > RLE. HEENT: [place]/AT. Sclerae anicteric.  EOMI. Cardiovascular: RRR, no M/R/G.  Lungs: Respirations mildly labored.  Course bilaterally. Abdomen: BS x  4, soft, NT/ND.  Musculoskeletal: No gross deformities, no edema.  Skin: Intact, warm, no rashes.  Assessment & Plan:   Acute hypoxic respiratory failure - certainly due to aspiration with hx poor secretion / airway management, but likely multifactorial.  Can not rule out HCAP.  COVID negative, but still high risk (see HPI). - Continue supplemental O2 as needed to maintain SpO2 > 92%. - Empiric vanc / zosyn. - Follow cultures.  Add RVP. - Would maintain COVID precautions despite negative test.  - Robinul for excessive secretions. - Low dose morphine for air hunger (with PRN benadryl due to itching). - Continue NTS. - DNR status.  Stable for progressive care admission under [facility] service.  Rest per them. Nothing further to add.  PCCM will sign off.  Please do not hesitate to call us back if we can be of any further assistance.  Best Practice:  Diet: NPO.  Pain/Anxiety/Delirium protocol (if indicated): None. VAP protocol (if indicated): None. DVT prophylaxis: SCD's. GI prophylaxis: None. Glucose control: None. Mobility: Bedrest. Code Status: DNR, confirmed with pt and wife by Dr. Gilford Raid. Family Communication: Wife updated at bedside. Disposition: Progressive.  Labs   CBC: Recent Labs  Lab 12/01/2018 2110 12/02/2018 2120 12/01/2018 2157  WBC 6.9  --   --   NEUTROABS 6.3  --   --   HGB 8.9* 13.9 9.2*  HCT 28.9* 41.0 27.0*  MCV 84.8  --   --   PLT 200  --   --    Basic Metabolic Panel: Recent Labs  Lab 11/21/2018 2110 11/17/2018 2120 11/27/2018 2157  NA 137 136 137  K 4.2 4.1 4.2  CL 93*  --   --   CO2 26  --   --   GLUCOSE 157*  --   --   BUN 83*  --   --   CREATININE 3.28*  --   --   CALCIUM 9.0  --   --    GFR: Estimated Creatinine Clearance: 21 mL/min (A) (by C-G formula based on SCr of 3.28 mg/dL (H)). Recent Labs  Lab 11/28/2018 2110  WBC 6.9  LATICACIDVEN 2.8*   Liver Function Tests: Recent Labs  Lab 11/23/2018 2110  AST 37  ALT 20  ALKPHOS 70  BILITOT 0.6  PROT 7.1  ALBUMIN 2.4*   No results for input(s): LIPASE, AMYLASE in the last 168 hours. No results for input(s): AMMONIA in the last 168 hours. ABG    Component Value Date/Time   PHART 7.425 12/03/2018 2120   PCO2ART 43.7 12/03/2018 2120   PO2ART 109.0 (H) 11/26/2018 2120   HCO3 27.4 12/08/2018 2157   TCO2 28 11/21/2018 2157   O2SAT 91.0 11/11/2018 2157    Coagulation Profile: No results for input(s): INR, PROTIME in the last 168 hours. Cardiac Enzymes: No results for input(s):  CKTOTAL, CKMB, CKMBINDEX, TROPONINI in the last 168 hours. HbA1C: Hgb A1c MFr Bld  Date/Time Value Ref Range Status  01/27/2018 04:59 AM 6.0 (H) 4.8 - 5.6 % Final    Comment:    (NOTE) Pre diabetes:          5.7%-6.4% Diabetes:              >6.4% Glycemic control for   <7.0% adults with diabetes   08/19/2017 01:36 PM 6.2 (H) 4.8 - 5.6 % Final    Comment:    (NOTE) Pre diabetes:          5.7%-6.4% Diabetes:              >  6.4% Glycemic control for   <7.0% adults with diabetes    CBG: No results for input(s): GLUCAP in the last 168 hours.  Review of Systems:   All negative; except for those that are bolded, which indicate positives.  Constitutional: weight loss, weight gain, night sweats, fevers, chills, fatigue, weakness.  HEENT: headaches, sore throat, sneezing, nasal congestion, post nasal drip, difficulty swallowing, tooth/dental problems, visual complaints, visual changes, ear aches. Neuro: difficulty with speech, weakness, numbness, ataxia. CV:  chest pain, orthopnea, PND, swelling in lower extremities, dizziness, palpitations, syncope.  Resp: cough, hemoptysis, dyspnea, wheezing, excessive secretions. GI: heartburn, indigestion, abdominal pain, nausea, vomiting, diarrhea, constipation, change in bowel habits, loss of appetite, hematemesis, melena, hematochezia.  GU: dysuria, change in color of urine, urgency or frequency, flank pain, hematuria. MSK: joint pain or swelling, decreased range of motion. Psych: change in mood or affect, depression, anxiety, suicidal ideations, homicidal ideations. Skin: rash, itching, bruising.   Past medical history  He,  has a past medical history of Anxiety, Arthritis, Cancer ([place]), Chronic cough, Chronic kidney disease, Diabetes mellitus without complication ([place]), Gout, History of radiation therapy (12/23/2014- 02/10/2015), Hyperlipidemia, Hypertension, Inguinal hernia, and Right inguinal hernia (08/23/2017).   Surgical History    Past  Surgical History:  Procedure Laterality Date  . colonoscopy    . COLONOSCOPY W/ POLYPECTOMY    . INGUINAL HERNIA REPAIR Right 08/23/2017   Procedure: OPEN REPAIR RIGHT INGUINAL HERNIA WITH MESH;  Surgeon: Fanny Skates, MD;  Location: [facility];  Service: General;  Laterality: Right;  GENERAL AND TAP BLOCK  . INSERTION OF MESH Right 08/23/2017   Procedure: INSERTION OF MESH;  Surgeon: Fanny Skates, MD;  Location: [facility];  Service: General;  Laterality: Right;  GENERAL AND TAP BLOCK  . IR GASTROSTOMY TUBE MOD SED  02/06/2018  . IR [place] TUBE PERCUT W/FLUORO  04/10/2018  . IR REPLC GASTRO/COLONIC TUBE PERCUT W/FLUORO  10/26/2018  . KNEE SURGERY Left 1990's   tibia crushed, pinned and bone grafted  . LYMPH NODE BIOPSY    . MULTIPLE EXTRACTIONS WITH ALVEOLOPLASTY N/A 12/06/2014   Procedure: Extraction of tooth #'s 2,4,5,6,7,8,9,10,11,12,13,14,15, 20,22,26,27,28,29 with alveoloplasty.;  Surgeon: Lenn Cal, DDS;  Location: [place];  Service: Oral Surgery;  Laterality: N/A;     Social History   reports that he quit smoking about 20 years ago. He has a 25.00 pack-year smoking history. He has never used smokeless tobacco. He reports that he does not drink alcohol or use drugs.   Family history   His family history includes Cancer in his brother, father, and mother; Heart block in his brother.   Allergies Allergies  Allergen Reactions  . Cefepime     Urticarial rash - unclear cause, had been receiving cefepime for several days and had also had flagyl as well  . Flagyl [Metronidazole]     Urticarial rash - unclear, was also receiving cefepime      Home meds  Prior to Admission medications   Medication Sig Start Date End Date Taking? Authorizing Provider  Amino Acids-Protein Hydrolys (FEEDING SUPPLEMENT, PRO-STAT SUGAR FREE 64,) LIQD Place 30 mLs into feeding tube 2 (two) times daily. 10/27/18   Mariel Aloe, MD  amLODipine (NORVASC) 5 MG tablet Place 10 mg into feeding  tube daily.  09/28/18   [provider]  guaiFENesin (ROBITUSSIN) 100 MG/5ML SOLN Place 5 mLs (100 mg total) into feeding tube 4 (four) times daily. 10/27/18   Mariel Aloe, MD  LORazepam (ATIVAN)  1 MG tablet Take 1 tablet (1 mg total) by mouth daily as needed for anxiety. 10/27/18   Mariel Aloe, MD  Melatonin 5 MG TABS Place 5 mg into feeding tube at bedtime.    [provider]  methylphenidate (RITALIN) 5 MG tablet Place 5 mg into feeding tube 2 (two) times daily.    [provider]  Nutritional Supplements (FEEDING SUPPLEMENT, GLUCERNA 1.5 CAL,) LIQD Place 237 mLs into feeding tube 6 (six) times daily. 10/27/18   Mariel Aloe, MD  tiZANidine (ZANAFLEX) 4 MG tablet Place 4 mg into feeding tube at bedtime.  12/12/17   [provider]  Water For Irrigation, Sterile (FREE WATER) SOLN Place 100 mLs into feeding tube 6 (six) times daily. With meals 10/27/18   Mariel Aloe, MD  Water For Irrigation, Sterile (FREE WATER) SOLN Place 300 mLs into feeding tube every 8 (eight) hours. Between meals 10/27/18   Mariel Aloe, MD     Montey Hora, [facility] Pager: [phone].  If no answer, [phone] 11/27/2018, 11:28 PM

## 2018-11-20 NOTE — ED Notes (Signed)
After pt had conversation w/ wife and daughter, pt verbalizes to this RN that he wishes to proceed w/ intubation. Pt's wishes communicated to MD Marijean Bravo.

## 2018-11-21 ENCOUNTER — Encounter (HOSPITAL_COMMUNITY): Payer: Self-pay | Admitting: Internal Medicine

## 2018-11-21 DIAGNOSIS — Z66 Do not resuscitate: Secondary | ICD-10-CM | POA: Diagnosis present

## 2018-11-21 DIAGNOSIS — R64 Cachexia: Secondary | ICD-10-CM | POA: Diagnosis present

## 2018-11-21 DIAGNOSIS — I69191 Dysphagia following nontraumatic intracerebral hemorrhage: Secondary | ICD-10-CM | POA: Diagnosis not present

## 2018-11-21 DIAGNOSIS — Z931 Gastrostomy status: Secondary | ICD-10-CM | POA: Diagnosis not present

## 2018-11-21 DIAGNOSIS — I69351 Hemiplegia and hemiparesis following cerebral infarction affecting right dominant side: Secondary | ICD-10-CM | POA: Diagnosis not present

## 2018-11-21 DIAGNOSIS — I69391 Dysphagia following cerebral infarction: Secondary | ICD-10-CM | POA: Diagnosis not present

## 2018-11-21 DIAGNOSIS — I69120 Aphasia following nontraumatic intracerebral hemorrhage: Secondary | ICD-10-CM | POA: Diagnosis not present

## 2018-11-21 DIAGNOSIS — Z85818 Personal history of malignant neoplasm of other sites of lip, oral cavity, and pharynx: Secondary | ICD-10-CM | POA: Diagnosis not present

## 2018-11-21 DIAGNOSIS — I129 Hypertensive chronic kidney disease with stage 1 through stage 4 chronic kidney disease, or unspecified chronic kidney disease: Secondary | ICD-10-CM | POA: Diagnosis present

## 2018-11-21 DIAGNOSIS — E119 Type 2 diabetes mellitus without complications: Secondary | ICD-10-CM

## 2018-11-21 DIAGNOSIS — E1122 Type 2 diabetes mellitus with diabetic chronic kidney disease: Secondary | ICD-10-CM | POA: Diagnosis not present

## 2018-11-21 DIAGNOSIS — J9621 Acute and chronic respiratory failure with hypoxia: Secondary | ICD-10-CM | POA: Diagnosis present

## 2018-11-21 DIAGNOSIS — I10 Essential (primary) hypertension: Secondary | ICD-10-CM

## 2018-11-21 DIAGNOSIS — J9601 Acute respiratory failure with hypoxia: Secondary | ICD-10-CM | POA: Diagnosis not present

## 2018-11-21 DIAGNOSIS — F40240 Claustrophobia: Secondary | ICD-10-CM | POA: Diagnosis present

## 2018-11-21 DIAGNOSIS — E785 Hyperlipidemia, unspecified: Secondary | ICD-10-CM | POA: Diagnosis present

## 2018-11-21 DIAGNOSIS — J690 Pneumonitis due to inhalation of food and vomit: Secondary | ICD-10-CM | POA: Diagnosis present

## 2018-11-21 DIAGNOSIS — I69151 Hemiplegia and hemiparesis following nontraumatic intracerebral hemorrhage affecting right dominant side: Secondary | ICD-10-CM | POA: Diagnosis not present

## 2018-11-21 DIAGNOSIS — Z515 Encounter for palliative care: Secondary | ICD-10-CM | POA: Diagnosis present

## 2018-11-21 DIAGNOSIS — N179 Acute kidney failure, unspecified: Secondary | ICD-10-CM | POA: Diagnosis present

## 2018-11-21 DIAGNOSIS — R627 Adult failure to thrive: Secondary | ICD-10-CM | POA: Diagnosis present

## 2018-11-21 DIAGNOSIS — Z87891 Personal history of nicotine dependence: Secondary | ICD-10-CM | POA: Diagnosis not present

## 2018-11-21 DIAGNOSIS — N183 Chronic kidney disease, stage 3 (moderate): Secondary | ICD-10-CM | POA: Diagnosis present

## 2018-11-21 DIAGNOSIS — K4090 Unilateral inguinal hernia, without obstruction or gangrene, not specified as recurrent: Secondary | ICD-10-CM | POA: Diagnosis present

## 2018-11-21 DIAGNOSIS — Z85819 Personal history of malignant neoplasm of unspecified site of lip, oral cavity, and pharynx: Secondary | ICD-10-CM

## 2018-11-21 DIAGNOSIS — Z6821 Body mass index (BMI) 21.0-21.9, adult: Secondary | ICD-10-CM | POA: Diagnosis not present

## 2018-11-21 DIAGNOSIS — A419 Sepsis, unspecified organism: Secondary | ICD-10-CM | POA: Diagnosis not present

## 2018-11-21 DIAGNOSIS — Z20828 Contact with and (suspected) exposure to other viral communicable diseases: Secondary | ICD-10-CM | POA: Diagnosis present

## 2018-11-21 DIAGNOSIS — D631 Anemia in chronic kidney disease: Secondary | ICD-10-CM | POA: Diagnosis present

## 2018-11-21 DIAGNOSIS — B9620 Unspecified Escherichia coli [E. coli] as the cause of diseases classified elsewhere: Secondary | ICD-10-CM | POA: Diagnosis present

## 2018-11-21 DIAGNOSIS — Z923 Personal history of irradiation: Secondary | ICD-10-CM | POA: Diagnosis not present

## 2018-11-21 LAB — BLOOD CULTURE ID PANEL (REFLEXED)

## 2018-11-21 LAB — RESPIRATORY PANEL BY PCR

## 2018-11-21 LAB — BASIC METABOLIC PANEL
Anion gap: 14 (ref 5–15)
BUN: 79 mg/dL — ABNORMAL HIGH (ref 8–23)
CO2: 25 mmol/L (ref 22–32)
Calcium: 8.5 mg/dL — ABNORMAL LOW (ref 8.9–10.3)
Chloride: 100 mmol/L (ref 98–111)
Creatinine, Ser: 3.18 mg/dL — ABNORMAL HIGH (ref 0.61–1.24)
GFR calc Af Amer: 22 mL/min — ABNORMAL LOW (ref >60)
GFR calc non Af Amer: 19 mL/min — ABNORMAL LOW (ref >60)
Glucose, Bld: 168 mg/dL — ABNORMAL HIGH (ref 70–99)
Potassium: 4.5 mmol/L (ref 3.5–5.1)
Sodium: 139 mmol/L (ref 135–145)

## 2018-11-21 LAB — CBC
HCT: 25.3 % — ABNORMAL LOW (ref 39.0–52.0)
Hemoglobin: 7.7 g/dL — ABNORMAL LOW (ref 13.0–17.0)
MCH: 25.6 pg — ABNORMAL LOW (ref 26.0–34.0)
MCHC: 30.4 g/dL (ref 30.0–36.0)
MCV: 84.1 fL (ref 80.0–100.0)
Platelets: 196 10*3/uL (ref 150–400)
RBC: 3.01 MIL/uL — ABNORMAL LOW (ref 4.22–5.81)
RDW: 18.4 % — ABNORMAL HIGH (ref 11.5–15.5)
WBC: 4.7 10*3/uL (ref 4.0–10.5)
nRBC: 0.4 % — ABNORMAL HIGH (ref 0.0–0.2)

## 2018-11-21 LAB — LACTIC ACID, PLASMA: Lactic Acid, Venous: 1.7 mmol/L (ref 0.5–1.9)

## 2018-11-21 LAB — EXPECTORATED SPUTUM ASSESSMENT W GRAM STAIN, RFLX TO RESP C

## 2018-11-21 LAB — URINE CULTURE

## 2018-11-21 LAB — SARS CORONAVIRUS 2 BY RT PCR (HOSPITAL ORDER, PERFORMED IN ~~LOC~~ HOSPITAL LAB): SARS Coronavirus 2: NEGATIVE

## 2018-11-21 LAB — GLUCOSE, CAPILLARY: Glucose-Capillary: 157 mg/dL — ABNORMAL HIGH (ref 70–99)

## 2018-11-21 MED ORDER — FREE WATER
300.0000 mL | Freq: Three times a day (TID) | Status: DC
  Administered 2018-11-21 – 2018-11-22 (×4): 300 mL

## 2018-11-21 MED ORDER — PIPERACILLIN-TAZOBACTAM 3.375 G IVPB
3.3750 g | Freq: Three times a day (TID) | INTRAVENOUS | Status: DC
  Administered 2018-11-21: 3.375 g via INTRAVENOUS
  Filled 2018-11-21: qty 50

## 2018-11-21 MED ORDER — METHYLPHENIDATE HCL 5 MG PO TABS
5.0000 mg | ORAL_TABLET | Freq: Two times a day (BID) | ORAL | Status: DC
  Administered 2018-11-21 – 2018-11-22 (×3): 5 mg
  Filled 2018-11-21 (×3): qty 1

## 2018-11-21 MED ORDER — ENOXAPARIN SODIUM 30 MG/0.3ML ~~LOC~~ SOLN
30.0000 mg | Freq: Every day | SUBCUTANEOUS | Status: DC
  Administered 2018-11-21 – 2018-11-22 (×2): 30 mg via SUBCUTANEOUS
  Filled 2018-11-21 (×2): qty 0.3

## 2018-11-21 MED ORDER — ONDANSETRON HCL 4 MG PO TABS: 4.0000 mg | ORAL_TABLET | Freq: Four times a day (QID) | ORAL | Status: DC | PRN

## 2018-11-21 MED ORDER — ENSURE ENLIVE PO LIQD
237.0000 mL | Freq: Every day | ORAL | Status: DC
  Administered 2018-11-21 – 2018-11-22 (×8): 237 mL
  Filled 2018-11-21: qty 237

## 2018-11-21 MED ORDER — MORPHINE SULFATE (PF) 2 MG/ML IV SOLN
2.0000 mg | INTRAVENOUS | Status: DC | PRN
  Administered 2018-11-21 – 2018-11-22 (×5): 2 mg via INTRAVENOUS
  Filled 2018-11-21 (×5): qty 1

## 2018-11-21 MED ORDER — VANCOMYCIN HCL IN DEXTROSE 750-5 MG/150ML-% IV SOLN: 750.0000 mg | INTRAVENOUS | Status: DC

## 2018-11-21 MED ORDER — ACETAMINOPHEN 650 MG RE SUPP
650.0000 mg | Freq: Four times a day (QID) | RECTAL | Status: DC | PRN
  Administered 2018-11-21: 650 mg via RECTAL
  Filled 2018-11-21: qty 1

## 2018-11-21 MED ORDER — PRO-STAT SUGAR FREE PO LIQD
30.0000 mL | Freq: Two times a day (BID) | ORAL | Status: DC
  Administered 2018-11-21 – 2018-11-22 (×3): 30 mL
  Filled 2018-11-21 (×3): qty 30

## 2018-11-21 MED ORDER — AMLODIPINE BESYLATE 10 MG PO TABS
10.0000 mg | ORAL_TABLET | Freq: Every day | ORAL | Status: DC
  Administered 2018-11-21 – 2018-11-22 (×2): 10 mg
  Filled 2018-11-21 (×2): qty 1

## 2018-11-21 MED ORDER — ACETAMINOPHEN 325 MG PO TABS: 650.0000 mg | ORAL_TABLET | Freq: Four times a day (QID) | ORAL | Status: DC | PRN

## 2018-11-21 MED ORDER — LORAZEPAM 2 MG/ML IJ SOLN
1.0000 mg | Freq: Once | INTRAMUSCULAR | Status: AC
  Administered 2018-11-21: 1 mg via INTRAVENOUS
  Filled 2018-11-21: qty 1

## 2018-11-21 MED ORDER — ONDANSETRON HCL 4 MG/2ML IJ SOLN: 4.0000 mg | Freq: Four times a day (QID) | INTRAMUSCULAR | Status: DC | PRN

## 2018-11-21 MED ORDER — FREE WATER
100.0000 mL | Freq: Every day | Status: DC
  Administered 2018-11-21 – 2018-11-22 (×8): 100 mL

## 2018-11-21 MED ORDER — SODIUM CHLORIDE 0.9 % IV SOLN
INTRAVENOUS | Status: DC | PRN
  Administered 2018-11-21: 500 mL via INTRAVENOUS

## 2018-11-21 MED ORDER — SODIUM CHLORIDE 0.9 % IV SOLN
2.0000 g | INTRAVENOUS | Status: DC
  Administered 2018-11-21: 2 g via INTRAVENOUS
  Filled 2018-11-21 (×2): qty 20

## 2018-11-21 MED ORDER — INSULIN ASPART 100 UNIT/ML ~~LOC~~ SOLN: 0.0000 [IU] | SUBCUTANEOUS | Status: DC

## 2018-11-21 NOTE — Progress Notes (Signed)
RT NTS both [place]. Copious amounts of secretions obtained. No complications. RT will continue to monitor.

## 2018-11-21 NOTE — Progress Notes (Signed)
RT nasotracheal suctioned patient to clear secretions.  Thick, yellow, large amount was retrieved from patients right side.  Patients SP02 dropped to 82% during this procedure RT placed patient back on 100% NRB SP02 started to rise in low 90's.  RT will continue to evaluate and monitor throughout the night.

## 2018-11-21 NOTE — Progress Notes (Signed)
Pt transferred from ED,arrived [facility] at 01:30 am, his wife at bedside. CHG bath given, CCMD called and Pt's EKG sinus rhythm/ sinus tachycardia with 2nd person verified.  Pt's wife, Ms. Tye [place] Leidy refused her husband to get Insulin and blood glucose checking q 4 hr. We will discuss this issue with MD at am. she's a retired Therapist, sports who is currently a primary care giver for her husband, stated from her previous experience,Pt was very sensitive to insulin and he had hypoglycemic easily.   From ED RN reported, we may need to recheck his COVID screening again due to suspected fault negative, inaccurate specimen. MD made aware.  Notified RT, Pt needed nasotrecheal suction, auscultated both lungs were congested and unable to cough. He was on non-rebreather bag with 15 LPM, FiO2 100%. SPO2 98%, RR 28. Pt was apparently lethargic and confused, disoriented x 3. Will continue to monitor   Kennyth Lose, BSN,RN,PCCN-CMC,CSC

## 2018-11-21 NOTE — Progress Notes (Signed)
Attempted again to contact wife again on both home and cell number. Will attempt to re-connect with her tomorrow given no answer or call back from previous voicemails.   Alda Lea, AGPCNP-BC Palliative Medicine Team  Phone: [phone] Pager: [phone] Amion: Bjorn Pippin

## 2018-11-21 NOTE — Progress Notes (Signed)
RT nasotracheal suctioned patient on right and left side. Obtained sputum sample and sent to lab.

## 2018-11-21 NOTE — Progress Notes (Addendum)
Same day note  Patient seen and examined at bedside.  Patient was admitted to the hospital for respiratory difficulty and hypoxia.  At the time of my evaluation, patient complains of being anxious with dyspnea.  Right-sided hemiparesis.  PEG tube in place. Physical examination reveals a cachectic male with nonrebreather mask, coarse breath sounds, gurgling sounds from throat secretions  Laboratory data and imaging was reviewed  Assessment and Plan.  Likely aspiration pneumonia causing acute respiratory failure with hypoxia.  Continue on IV antibiotic.  Continue PEG tube feeding.  Palliative care has been consulted.  Patient with a poor prognosis.  Patient's family did not wish sliding scale insulin for the patient.  Goals of care need to be defined.  Will follow palliative care recommendations.  COVID-19 was negative. Morphine air hunger.  Patient is DNR. [name] doesn't want sliding insulin.   E coli bacteremia. Will deescalate  antibiotics.  Spoke with the patient's wife on the phone and spoke about the clinical condition of the patient and the poor prognosis of the patient. She wishes to continue current medications.  Will check urine culture. De-escalate antibiotics.   No Charge   Signed,  Delila Pereyra, MD [facility]

## 2018-11-21 NOTE — ED Notes (Signed)
ED TO INPATIENT HANDOFF REPORT  ED Nurse Name and Phone #: Tray Martinique, [phone]  S Name/Age/Gender Adam Yang 73 y.o. male Room/Bed: 020C/020C  Code Status   Code Status: DNR  Home/SNF/Other Home Patient oriented to: self, place, time and situation Is this baseline? Yes   Triage Complete: Triage complete  Chief Complaint Stomach pain   Triage Note Pt arrives POV for eval of ongoing SOB, lethargy and weakness. Pt was seen here 4/13 for similar. Pt tachycardic to 150s on arrival, resp rate 40s. Pt is more confused than usual per family. 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 Pt is pale and clammy, responsive to voice. Hx of CA, not currently immunosuppressed per famiyl    Allergies Allergies  Allergen Reactions  . Cefepime     Urticarial rash - unclear cause, had been receiving cefepime for several days and had also had flagyl as well  . Flagyl [Metronidazole]     Urticarial rash - unclear, was also receiving cefepime     Level of Care/Admitting Diagnosis ED Disposition    ED Disposition Condition [place] [facility] Area: [place] [[id number]]  Level of Care: Progressive [102]  Covid Evaluation: Screening Protocol (No Symptoms)  Diagnosis: Acute respiratory failure with hypoxia [facility]) [[id number]]  Admitting Physician: Rise Patience [phone]  Attending Physician: Rise Patience [phone]  Estimated length of stay: past midnight tomorrow  Certification:: I certify this patient will need inpatient services for at least 2 midnights  PT Class (Do Not Modify): Inpatient [101]  PT Acc Code (Do Not Modify): Private [1]       B Medical/Surgery History Past Medical History:  Diagnosis Date  . Anxiety    Claustrophobia  . Arthritis   . Cancer (HCC)    nasopharynx  . Chronic cough    EXCESS MUCUS BUILDUP SINCE ORAL SURGERY FROM CANCER   . Chronic kidney disease    Renal Insuffiency , Creatinine has been in the 2-  3 range, 11/27/14 Creatine 1.4  . Diabetes mellitus without complication ([place])    Type II  . Gout   . History of radiation therapy 12/23/2014- 02/10/2015   Nasopharynx / Neck Adenopathy  . Hyperlipidemia   . Hypertension   . Inguinal hernia   . Right inguinal hernia 08/23/2017   Past Surgical History:  Procedure Laterality Date  . colonoscopy    . COLONOSCOPY W/ POLYPECTOMY    . INGUINAL HERNIA REPAIR Right 08/23/2017   Procedure: OPEN REPAIR RIGHT INGUINAL HERNIA WITH MESH;  Surgeon: Fanny Skates, MD;  Location: [facility];  Service: General;  Laterality: Right;  GENERAL AND TAP BLOCK  . INSERTION OF MESH Right 08/23/2017   Procedure: INSERTION OF MESH;  Surgeon: Fanny Skates, MD;  Location: [facility];  Service: General;  Laterality: Right;  GENERAL AND TAP BLOCK  . IR GASTROSTOMY TUBE MOD SED  02/06/2018  . IR [place] TUBE PERCUT W/FLUORO  04/10/2018  . IR REPLC GASTRO/COLONIC TUBE PERCUT W/FLUORO  10/26/2018  . KNEE SURGERY Left 1990's   tibia crushed, pinned and bone grafted  . LYMPH NODE BIOPSY    . MULTIPLE EXTRACTIONS WITH ALVEOLOPLASTY N/A 12/06/2014   Procedure: Extraction of tooth #'s 2,4,5,6,7,8,9,10,11,12,13,14,15, 20,22,26,27,28,29 with alveoloplasty.;  Surgeon: Lenn Cal, DDS;  Location: [place];  Service: Oral Surgery;  Laterality: N/A;     A IV Location/Drains/Wounds Patient Lines/Drains/Airways Status   Active Line/Drains/Airways    Name:   Placement date:  Placement time:   Site:   Days:   Peripheral IV 12/07/2018 Left Forearm   11/18/2018    2121    Forearm   1   Peripheral IV 12/05/2018 Right Antecubital   11/25/2018    2118    Antecubital   1   Gastrostomy/Enterostomy Gastrostomy 20 Fr. LUQ   04/10/18    1353    LUQ   225   Gastrostomy/Enterostomy Gastrostomy 20 Fr. LUQ   10/26/18    1739    LUQ   26   External Urinary Catheter   10/25/18    1948    -   27   Incision (Closed) 08/23/17 Abdomen Other (Comment)   08/23/17    0822     455   Wound / Incision  (Open or Dehisced) 02/05/18 Sacrum Medial Non blanchable redness and soreness at the sacrum area   02/05/18    1301    Sacrum   289          Intake/Output Last 24 hours  Intake/Output Summary (Last 24 hours) at 11/21/2018 0047 Last data filed at 11/25/2018 2258 Gross per 24 hour  Intake 368.33 ml  Output -  Net 368.33 ml    Labs/Imaging Results for orders placed or performed during the hospital encounter of 11/19/2018 (from the past 48 hour(s))  Lactic acid, plasma     Status: Abnormal   Collection Time: 11/30/2018  9:10 PM  Result Value Ref Range   Lactic Acid, Venous 2.8 (HH) 0.5 - 1.9 mmol/L    Comment: CRITICAL RESULT CALLED TO, READ BACK BY AND VERIFIED WITH: Willaim Bane 11/21/2018 [place] Performed at [facility], [place] [address] [place], [place] [address]   Comprehensive metabolic panel     Status: Abnormal   Collection Time: 11/30/2018  9:10 PM  Result Value Ref Range   Sodium 137 135 - 145 mmol/L   Potassium 4.2 3.5 - 5.1 mmol/L   Chloride 93 (L) 98 - 111 mmol/L   CO2 26 22 - 32 mmol/L   Glucose, Bld 157 (H) 70 - 99 mg/dL   BUN 83 (H) 8 - 23 mg/dL   Creatinine, Ser 3.28 (H) 0.61 - 1.24 mg/dL   Calcium 9.0 8.9 - 10.3 mg/dL   Total Protein 7.1 6.5 - 8.1 g/dL   Albumin 2.4 (L) 3.5 - 5.0 g/dL   AST 37 15 - 41 U/L   ALT 20 0 - 44 U/L   Alkaline Phosphatase 70 38 - 126 U/L   Total Bilirubin 0.6 0.3 - 1.2 mg/dL   GFR calc non Af Amer 18 (L) >60 mL/min   GFR calc Af Amer 21 (L) >60 mL/min   Anion gap 18 (H) 5 - 15    Comment: Performed at [facility], [place] [address] [place], [place] [address]  CBC WITH DIFFERENTIAL     Status: Abnormal   Collection Time: 11/24/2018  9:10 PM  Result Value Ref Range   WBC 6.9 4.0 - 10.5 K/uL   RBC 3.41 (L) 4.22 - 5.81 MIL/uL   Hemoglobin 8.9 (L) 13.0 - 17.0 g/dL   HCT 28.9 (L) 39.0 - 52.0 %   MCV 84.8 80.0 - 100.0 fL   MCH 26.1 26.0 - 34.0 pg   MCHC 30.8 30.0 - 36.0 g/dL   RDW 18.2 (H) 11.5 - 15.5 %   Platelets 200 150 -  400 K/uL   nRBC 0.0 0.0 - 0.2 %   Neutrophils  Relative % 91 %   Neutro Abs 6.3 1.7 - 7.7 K/uL   Lymphocytes Relative 5 %   Lymphs Abs 0.3 (L) 0.7 - 4.0 K/uL   Monocytes Relative 4 %   Monocytes Absolute 0.3 0.1 - 1.0 K/uL   Eosinophils Relative 0 %   Eosinophils Absolute 0.0 0.0 - 0.5 K/uL   Basophils Relative 0 %   Basophils Absolute 0.0 0.0 - 0.1 K/uL   WBC Morphology See Note     Comment: Increased Bands. >20% Bands  Toxic Granulation  Dohle Bodies    nRBC 1 (H) 0 /100 WBC   Abs Immature Granulocytes 0.00 0.00 - 0.07 K/uL   Polychromasia PRESENT     Comment: Performed at [facility] [address], [place], [place] [address]  Urinalysis, Routine w reflex microscopic     Status: Abnormal   Collection Time: 11/21/2018  9:19 PM  Result Value Ref Range   Color, Urine YELLOW YELLOW   APPearance CLOUDY (A) CLEAR   Specific Gravity, Urine 1.014 1.005 - 1.030   pH 6.0 5.0 - 8.0   Glucose, UA 50 (A) NEGATIVE mg/dL   Hgb urine dipstick MODERATE (A) NEGATIVE   Bilirubin Urine NEGATIVE NEGATIVE   Ketones, ur NEGATIVE NEGATIVE mg/dL   Protein, ur 100 (A) NEGATIVE mg/dL   Nitrite POSITIVE (A) NEGATIVE   Leukocytes,Ua NEGATIVE NEGATIVE   RBC / HPF 0-5 0 - 5 RBC/hpf   WBC, UA 0-5 0 - 5 WBC/hpf   Bacteria, UA MANY (A) NONE SEEN   Squamous Epithelial / LPF 0-5 0 - 5    Comment: Performed at [facility], [place] [address] [place], [place] [address]  SARS Coronavirus 2 (CEPHEID- Performed in [place] hospital lab), Hosp Order     Status: None   Collection Time: 11/29/2018  9:19 PM  Result Value Ref Range   SARS Coronavirus 2 NEGATIVE NEGATIVE    Comment: (NOTE) If result is NEGATIVE SARS-CoV-2 target nucleic acids are NOT DETECTED. The SARS-CoV-2 RNA is generally detectable in upper and lower  respiratory specimens during the acute phase of infection. The lowest  concentration of SARS-CoV-2 viral copies this assay can detect is 250  copies / mL. A negative result does not  preclude SARS-CoV-2 infection  and should not be used as the sole basis for treatment or other  patient management decisions.  A negative result may occur with  improper specimen collection / handling, submission of specimen other  than nasopharyngeal swab, presence of viral mutation(s) within the  areas targeted by this assay, and inadequate number of viral copies  (<250 copies / mL). A negative result must be combined with clinical  observations, patient history, and epidemiological information. If result is POSITIVE SARS-CoV-2 target nucleic acids are DETECTED. The SARS-CoV-2 RNA is generally detectable in upper and lower  respiratory specimens dur ing the acute phase of infection.  Positive  results are indicative of active infection with SARS-CoV-2.  Clinical  correlation with patient history and other diagnostic information is  necessary to determine patient infection status.  Positive results do  not rule out bacterial infection or co-infection with other viruses. If result is PRESUMPTIVE POSTIVE SARS-CoV-2 nucleic acids MAY BE PRESENT.   A presumptive positive result was obtained on the submitted specimen  and confirmed on repeat testing.  While 2019 novel coronavirus  (SARS-CoV-2) nucleic acids may be present in the submitted sample  additional confirmatory testing may be necessary for epidemiological  and /  or clinical management purposes  to differentiate between  SARS-CoV-2 and other Sarbecovirus currently known to infect humans.  If clinically indicated additional testing with an alternate test  methodology [phone]) is advised. The SARS-CoV-2 RNA is generally  detectable in upper and lower respiratory sp ecimens during the acute  phase of infection. The expected result is Negative. Fact Sheet for Patients:  StrictlyIdeas.no Fact Sheet for Healthcare Providers: BankingDealers.co.za This test is not yet approved or cleared by  the [place] FDA and has been authorized for detection and/or diagnosis of SARS-CoV-2 by FDA under an Emergency Use Authorization (EUA).  This EUA will remain in effect (meaning this test can be used) for the duration of the COVID-19 declaration under Section 564(b)(1) of the Act, 21 U.S.C. section 360bbb-3(b)(1), unless the authorization is terminated or revoked sooner. Performed at [facility], [place] [address], [place], [place] [address]   I-STAT 7, (LYTES, BLD GAS, ICA, H+H)     Status: Abnormal   Collection Time: 12/05/2018  9:20 PM  Result Value Ref Range   pH, Arterial 7.425 7.350 - 7.450   pCO2 arterial 43.7 32.0 - 48.0 mmHg   pO2, Arterial 109.0 (H) 83.0 - 108.0 mmHg   Bicarbonate 28.2 (H) 20.0 - 28.0 mmol/L   TCO2 29 22 - 32 mmol/L   O2 Saturation 98.0 %   Acid-Base Excess 4.0 (H) 0.0 - 2.0 mmol/L   Sodium 136 135 - 145 mmol/L   Potassium 4.1 3.5 - 5.1 mmol/L   Calcium, Ion 1.13 (L) 1.15 - 1.40 mmol/L   HCT 41.0 39.0 - 52.0 %   Hemoglobin 13.9 13.0 - 17.0 g/dL   Patient temperature 102.2 F    Collection site RADIAL, ALLEN'S TEST ACCEPTABLE    Drawn by RT    Sample type ARTERIAL   POCT I-Stat EG7     Status: Abnormal   Collection Time: 11/27/2018  9:57 PM  Result Value Ref Range   pH, Ven 7.479 (H) 7.250 - 7.430   pCO2, Ven 36.9 (L) 44.0 - 60.0 mmHg   pO2, Ven 55.0 (H) 32.0 - 45.0 mmHg   Bicarbonate 27.4 20.0 - 28.0 mmol/L   TCO2 28 22 - 32 mmol/L   O2 Saturation 91.0 %   Acid-Base Excess 4.0 (H) 0.0 - 2.0 mmol/L   Sodium 137 135 - 145 mmol/L   Potassium 4.2 3.5 - 5.1 mmol/L   Calcium, Ion 1.06 (L) 1.15 - 1.40 mmol/L   HCT 27.0 (L) 39.0 - 52.0 %   Hemoglobin 9.2 (L) 13.0 - 17.0 g/dL   Patient temperature HIDE    Sample type VENOUS   Lactic acid, plasma     Status: None   Collection Time: 11/21/18 12:09 AM  Result Value Ref Range   Lactic Acid, Venous 1.7 0.5 - 1.9 mmol/L    Comment: Performed at [facility], [place] [address] [place], [place]  [address]   Dg Abdomen 1 View  Result Date: 11/18/2018 CLINICAL DATA:  Hypoxia and shortness of breath. EXAM: ABDOMEN - 1 VIEW COMPARISON:  None. FINDINGS: The bowel gas pattern is normal. No radio-opaque calculi or other significant radiographic abnormality are seen. IMPRESSION: Negative. Electronically Signed   By: Ulyses Jarred M.D.   On: 12/04/2018 21:15   Dg Chest Port 1 View  Result Date: 11/10/2018 CLINICAL DATA:  Hypoxia, shortness of breath. EXAM: PORTABLE CHEST 1 VIEW COMPARISON:  10/26/2018 FINDINGS: Normal heart size. Bilateral pleural effusions with diminished aeration to the lung  bases noted. Pulmonary vascular congestion noted. Visualized osseous structures are unremarkable. IMPRESSION: 1. Congestive heart failure. 2. Diminished aeration to both lung bases. Electronically Signed   By: Kerby Moors M.D.   On: 11/26/2018 21:16    Pending Labs Unresulted Labs (From admission, onward)    Start     Ordered   11/21/18 0019  Novel Coronavirus,NAA,(SEND-OUT TO REF LAB - TAT 24-48 hrs); Hosp Order  (Symptomatic Patients Labs with Precautions )  ONCE - STAT,   R    Question Answer Comment  Current symptoms Fever and Shortness of breath   Excluded other viral illnesses Yes   Exposure Risk None      11/21/18 0019   12/06/2018 2355  Respiratory Panel by PCR  (Respiratory virus panel with precautions)  Once,   R     12/09/2018 2354   11/14/2018 2354  Expectorated sputum assessment w rflx to resp cult  Once,   R     12/04/2018 2353   12/01/2018 2041  Blood Culture (routine x 2)  BLOOD CULTURE X 2,   STAT     11/21/2018 2044   11/12/2018 2041  Urine culture  ONCE - STAT,   STAT     12/05/2018 2044          Vitals/Pain Today's Vitals   12/02/2018 2245 11/21/2018 2300 11/17/2018 2330 12/07/2018 2345  BP: (!) 157/72 (!) 153/78 (!) 157/79 (!) 162/77  Pulse: (!) 120 (!) 118 (!) 117 (!) 120  Resp: (!) 30 (!) 26    Temp:      TempSrc:      SpO2: 97% 98% 99% 99%  Weight:      Height:      PainSc:         Isolation Precautions Droplet and Contact precautions  Medications Medications  morphine 2 MG/ML injection 1 mg (1 mg Intravenous Given 11/28/2018 2348)  glycopyrrolate (ROBINUL) injection 0.2 mg (0.2 mg Intravenous Given 11/11/2018 2349)  diphenhydrAMINE (BENADRYL) injection 12.5 mg (12.5 mg Intravenous Given 11/11/2018 2349)  vancomycin (VANCOCIN) IVPB 1000 mg/200 mL premix (0 mg Intravenous Stopped 12/08/2018 2231)  clindamycin (CLEOCIN) IVPB 600 mg (0 mg Intravenous Stopped 11/11/2018 2214)  aztreonam (AZACTAM) 2 g in sodium chloride 0.9 % 100 mL IVPB (0 g Intravenous Stopped 11/19/2018 2258)  acetaminophen (TYLENOL) tablet 1,000 mg (1,000 mg Per Tube Given 11/11/2018 2234)    Mobility walks with device High fall risk   Focused Assessments Pulmonary Assessment Handoff:  Lung sounds: Bilateral Breath Sounds: Diminished, Rhonchi L Breath Sounds: Diminished R Breath Sounds: Diminished O2 Device: NRB O2 Flow Rate (L/min): 15 L/min      R Recommendations: See Admitting Provider Note  Report given to:   Additional Notes: Pt requires NT suctioning; resp aware

## 2018-11-21 NOTE — Progress Notes (Signed)
RT Note: NT suctioned on both [place] with moderate amounts of yellow sputum. Patient on NRB with sats 90-92%. RT will continue to monitor.

## 2018-11-21 NOTE — Progress Notes (Signed)
Several attempts to reach out to the wife today. Recently called both listed numbers and have left additional voicemails. Will await a return call.   Thank you for your referral.   Alda Lea, AGPCNP-BC Palliative Medicine Team  Phone: [phone] Pager: [phone] Amion: N. [place]

## 2018-11-21 NOTE — H&P (Signed)
History and Physical    Adam Yang EUM:[id number] DOB: 16-Apr-1946 DOA: 11/19/2018  PCP: Adam Cruel, MD  Patient coming from: Home.  Chief Complaint: Shortness of breath.  HPI: Adam Yang is a 73 y.o. 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  As per the wife who provided history the ER physician patient has been having some nausea vomiting last 3 days and became hypoxic.  Patient's family member also was sick last week.  CODE STATUS was not known at this time.  Patient has been having recent admission for aspiration.  ED Course: In the ER patient was hypoxic requiring 100% nonrebreather.  Pulmonary critical care was consulted at this time after discussion with patient wife patient was made a DNR but continue with present medication.  Chest x-ray shows congestive pattern and acute abdominal series was unremarkable.  Patient was started on empiric antibiotics and admitted for further management of acute respiratory failure likely from aspiration.  Morphine also was started for comfort.  Review of Systems: As per HPI, rest all negative.   Past Medical History:  Diagnosis Date  . Anxiety    Claustrophobia  . Arthritis   . Cancer (HCC)    nasopharynx  . Chronic cough    EXCESS MUCUS BUILDUP SINCE ORAL SURGERY FROM CANCER   . Chronic kidney disease    Renal Insuffiency , Creatinine has been in the 2- 3 range, 11/27/14 Creatine 1.4  . Diabetes mellitus without complication ([place])    Type II  . Gout   . History of radiation therapy 12/23/2014- 02/10/2015   Nasopharynx / Neck Adenopathy  . Hyperlipidemia   . Hypertension   . Inguinal hernia   . Right inguinal hernia 08/23/2017    Past Surgical History:  Procedure Laterality Date  . colonoscopy    . COLONOSCOPY W/ POLYPECTOMY    . INGUINAL HERNIA  REPAIR Right 08/23/2017   Procedure: OPEN REPAIR RIGHT INGUINAL HERNIA WITH MESH;  Surgeon: Fanny Skates, MD;  Location: [facility];  Service: General;  Laterality: Right;  GENERAL AND TAP BLOCK  . INSERTION OF MESH Right 08/23/2017   Procedure: INSERTION OF MESH;  Surgeon: Fanny Skates, MD;  Location: [facility];  Service: General;  Laterality: Right;  GENERAL AND TAP BLOCK  . IR GASTROSTOMY TUBE MOD SED  02/06/2018  . IR [place] TUBE PERCUT W/FLUORO  04/10/2018  . IR [facility] GASTRO/COLONIC TUBE PERCUT W/FLUORO  10/26/2018  . KNEE SURGERY Left 1990's   tibia crushed, pinned and bone grafted  . LYMPH NODE BIOPSY    . MULTIPLE EXTRACTIONS WITH ALVEOLOPLASTY N/A 12/06/2014   Procedure: Extraction of tooth #'s 2,4,5,6,7,8,9,10,11,12,13,14,15, 20,22,26,27,28,29 with alveoloplasty.;  Surgeon: Lenn Cal, DDS;  Location: [place];  Service: Oral Surgery;  Laterality: N/A;     reports that he quit smoking about 20 years ago. He has a 25.00 pack-year smoking history. He has never used smokeless tobacco. He reports that he does not drink alcohol or use drugs.  Allergies  Allergen Reactions  . Cefepime     Urticarial rash - unclear cause, had been receiving cefepime for several days and had also had flagyl as well  . Flagyl [Metronidazole]     Urticarial rash - unclear, was also receiving cefepime     Family History  Problem Relation Age of Onset  . Cancer Mother  throat ca  . Cancer Father        lung ca  . Cancer Brother        throat ca  . Heart block Brother     Prior to Admission medications   Medication Sig Start Date End Date Taking? Authorizing Provider  Amino Acids-Protein Hydrolys (FEEDING SUPPLEMENT, PRO-STAT SUGAR FREE 64,) LIQD Place 30 mLs into feeding tube 2 (two) times daily. 10/27/18   Mariel Aloe, MD  amLODipine (NORVASC) 5 MG tablet Place 10 mg into feeding tube daily.  09/28/18   [provider]  guaiFENesin (ROBITUSSIN) 100 MG/5ML SOLN Place  5 mLs (100 mg total) into feeding tube 4 (four) times daily. 10/27/18   Mariel Aloe, MD  LORazepam (ATIVAN) 1 MG tablet Take 1 tablet (1 mg total) by mouth daily as needed for anxiety. 10/27/18   Mariel Aloe, MD  Melatonin 5 MG TABS Place 5 mg into feeding tube at bedtime.    [provider]  methylphenidate (RITALIN) 5 MG tablet Place 5 mg into feeding tube 2 (two) times daily.    [provider]  Nutritional Supplements (FEEDING SUPPLEMENT, GLUCERNA 1.5 CAL,) LIQD Place 237 mLs into feeding tube 6 (six) times daily. 10/27/18   Mariel Aloe, MD  tiZANidine (ZANAFLEX) 4 MG tablet Place 4 mg into feeding tube at bedtime.  12/12/17   [provider]  Water For Irrigation, Sterile (FREE WATER) SOLN Place 100 mLs into feeding tube 6 (six) times daily. With meals 10/27/18   Mariel Aloe, MD  Water For Irrigation, Sterile (FREE WATER) SOLN Place 300 mLs into feeding tube every 8 (eight) hours. Between meals 10/27/18   Mariel Aloe, MD    Physical Exam: Vitals:   12/02/2018 2300 12/01/2018 2330 11/30/2018 2345 11/21/18 0144  BP: (!) 153/78 (!) 157/79 (!) 162/77   Pulse: (!) 118 (!) 117 (!) 120 (!) (P) 108  Resp: (!) 26     Temp:    (P) 98.5 F (36.9 C)  TempSrc:    (P) Axillary  SpO2: 98% 99% 99% (P) 100%  Weight:      Height:          Constitutional: Moderately built and nourished. Vitals:   11/28/2018 2300 11/14/2018 2330 11/16/2018 2345 11/21/18 0144  BP: (!) 153/78 (!) 157/79 (!) 162/77   Pulse: (!) 118 (!) 117 (!) 120 (!) (P) 108  Resp: (!) 26     Temp:    (P) 98.5 F (36.9 C)  TempSrc:    (P) Axillary  SpO2: 98% 99% 99% (P) 100%  Weight:      Height:       Eyes: Anicteric no pallor. ENMT: No discharge from the ears eyes nose or mouth. Neck: No mass felt.  No neck rigidity. Respiratory: No rhonchi or crepitations. Cardiovascular: S1-S2 heard. Abdomen: Soft nontender bowel sounds present. Musculoskeletal: No edema. Skin: No rash. Neurologic:  Confused does not follow commands. Psychiatric: Confused.   Labs on Admission: I have personally reviewed following labs and imaging studies  CBC: Recent Labs  Lab 11/28/2018 2110 11/18/2018 2120 11/19/2018 2157  WBC 6.9  --   --   NEUTROABS 6.3  --   --   HGB 8.9* 13.9 9.2*  HCT 28.9* 41.0 27.0*  MCV 84.8  --   --   PLT 200  --   --    Basic Metabolic Panel: Recent Labs  Lab 11/26/2018 2110 12/07/2018 2120 12/10/2018 2157  NA  137 136 137  K 4.2 4.1 4.2  CL 93*  --   --   CO2 26  --   --   GLUCOSE 157*  --   --   BUN 83*  --   --   CREATININE 3.28*  --   --   CALCIUM 9.0  --   --    GFR: Estimated Creatinine Clearance: 21 mL/min (A) (by C-G formula based on SCr of 3.28 mg/dL (H)). Liver Function Tests: Recent Labs  Lab 11/24/2018 2110  AST 37  ALT 20  ALKPHOS 70  BILITOT 0.6  PROT 7.1  ALBUMIN 2.4*   No results for input(s): LIPASE, AMYLASE in the last 168 hours. No results for input(s): AMMONIA in the last 168 hours. Coagulation Profile: No results for input(s): INR, PROTIME in the last 168 hours. Cardiac Enzymes: No results for input(s): CKTOTAL, CKMB, CKMBINDEX, TROPONINI in the last 168 hours. BNP (last 3 results) No results for input(s): PROBNP in the last 8760 hours. HbA1C: No results for input(s): HGBA1C in the last 72 hours. CBG: No results for input(s): GLUCAP in the last 168 hours. Lipid Profile: No results for input(s): CHOL, HDL, LDLCALC, TRIG, CHOLHDL, LDLDIRECT in the last 72 hours. Thyroid Function Tests: No results for input(s): TSH, T4TOTAL, FREET4, T3FREE, THYROIDAB in the last 72 hours. Anemia Panel: No results for input(s): VITAMINB12, FOLATE, FERRITIN, TIBC, IRON, RETICCTPCT in the last 72 hours. Urine analysis:    Component Value Date/Time   COLORURINE YELLOW 12/09/2018 2119   APPEARANCEUR CLOUDY (A) 12/09/2018 2119   LABSPEC 1.014 11/10/2018 2119   PHURINE 6.0 12/05/2018 2119   GLUCOSEU 50 (A) 11/13/2018 2119   HGBUR MODERATE (A)  11/11/2018 2119   BILIRUBINUR NEGATIVE 12/08/2018 2119   [place] 11/29/2018 2119   PROTEINUR 100 (A) 11/21/2018 2119   NITRITE POSITIVE (A) 11/25/2018 2119   LEUKOCYTESUR NEGATIVE 11/16/2018 2119   Sepsis Labs: @LABRCNTIP (procalcitonin:4,lacticidven:4) ) Recent Results (from the past 240 hour(s))  SARS Coronavirus 2 (CEPHEID- Performed in [place] hospital lab), Hosp Order     Status: None   Collection Time: 11/30/2018  9:19 PM  Result Value Ref Range Status   SARS Coronavirus 2 NEGATIVE NEGATIVE Final    Comment: (NOTE) If result is NEGATIVE SARS-CoV-2 target nucleic acids are NOT DETECTED. The SARS-CoV-2 RNA is generally detectable in upper and lower  respiratory specimens during the acute phase of infection. The lowest  concentration of SARS-CoV-2 viral copies this assay can detect is 250  copies / mL. A negative result does not preclude SARS-CoV-2 infection  and should not be used as the sole basis for treatment or other  patient management decisions.  A negative result may occur with  improper specimen collection / handling, submission of specimen other  than nasopharyngeal swab, presence of viral mutation(s) within the  areas targeted by this assay, and inadequate number of viral copies  (<250 copies / mL). A negative result must be combined with clinical  observations, patient history, and epidemiological information. If result is POSITIVE SARS-CoV-2 target nucleic acids are DETECTED. The SARS-CoV-2 RNA is generally detectable in upper and lower  respiratory specimens dur ing the acute phase of infection.  Positive  results are indicative of active infection with SARS-CoV-2.  Clinical  correlation with patient history and other diagnostic information is  necessary to determine patient infection status.  Positive results do  not rule out bacterial infection or co-infection with other viruses. If result is PRESUMPTIVE POSTIVE SARS-CoV-2 nucleic  acids MAY BE  PRESENT.   A presumptive positive result was obtained on the submitted specimen  and confirmed on repeat testing.  While 2019 novel coronavirus  (SARS-CoV-2) nucleic acids may be present in the submitted sample  additional confirmatory testing may be necessary for epidemiological  and / or clinical management purposes  to differentiate between  SARS-CoV-2 and other Sarbecovirus currently known to infect humans.  If clinically indicated additional testing with an alternate test  methodology [phone]) is advised. The SARS-CoV-2 RNA is generally  detectable in upper and lower respiratory sp ecimens during the acute  phase of infection. The expected result is Negative. Fact Sheet for Patients:  StrictlyIdeas.no Fact Sheet for Healthcare Providers: BankingDealers.co.za This test is not yet approved or cleared by the [place] FDA and has been authorized for detection and/or diagnosis of SARS-CoV-2 by FDA under an Emergency Use Authorization (EUA).  This EUA will remain in effect (meaning this test can be used) for the duration of the COVID-19 declaration under Section 564(b)(1) of the Act, 21 U.S.C. section 360bbb-3(b)(1), unless the authorization is terminated or revoked sooner. Performed at [facility], [place] [address] [place], [place] [address]   Expectorated sputum assessment w rflx to resp cult     Status: None   Collection Time: 11/21/18 12:42 AM  Result Value Ref Range Status   Specimen Description SPUTUM  Final   Special Requests NONE  Final   Sputum evaluation   Final    THIS SPECIMEN IS ACCEPTABLE FOR SPUTUM CULTURE Performed at [facility], [place] [address], [place], [place] [address]    Report Status 11/21/2018 FINAL  Final  Culture, respiratory     Status: None (Preliminary result)   Collection Time: 11/21/18 12:42 AM  Result Value Ref Range Status   Specimen Description SPUTUM  Final   Special Requests NONE  Reflexed from [id number]  Final   Gram Stain   Final    FEW WBC PRESENT, PREDOMINANTLY PMN ABUNDANT GRAM NEGATIVE RODS FEW GRAM POSITIVE RODS RARE GRAM POSITIVE COCCI Performed at [facility], [address], [place],  [address]    Culture PENDING  Incomplete   Report Status PENDING  Incomplete     Radiological Exams on Admission: Dg Abdomen 1 View  Result Date: 11/26/2018 CLINICAL DATA:  Hypoxia and shortness of breath. EXAM: ABDOMEN - 1 VIEW COMPARISON:  None. FINDINGS: The bowel gas pattern is normal. No radio-opaque calculi or other significant radiographic abnormality are seen. IMPRESSION: Negative. Electronically Signed   By: Ulyses Jarred M.D.   On: 12/01/2018 21:15   Dg Chest Port 1 View  Result Date: 11/18/2018 CLINICAL DATA:  Hypoxia, shortness of breath. EXAM: PORTABLE CHEST 1 VIEW COMPARISON:  10/26/2018 FINDINGS: Normal heart size. Bilateral pleural effusions with diminished aeration to the lung bases noted. Pulmonary vascular congestion noted. Visualized osseous structures are unremarkable. IMPRESSION: 1. Congestive heart failure. 2. Diminished aeration to both lung bases. Electronically Signed   By: Kerby Moors M.D.   On: 11/23/2018 21:16    EKG: Independently reviewed.  Sinus tachycardia.  Assessment/Plan Principal Problem:   Acute respiratory failure with hypoxia (HCC) Active Problems:   History of nasopharyngeal cancer   Type 2 diabetes mellitus, controlled, with renal complications (HCC)   Status post insertion of percutaneous endoscopic gastrostomy (PEG) tube (HCC)   Essential hypertension   Diabetes mellitus type 2 in nonobese (HCC)   Hemiparesis affecting right side as late effect of stroke ([place])  Dysphagia, post-stroke    1. Acute respiratory failure with hypoxia likely from aspiration for which patient is on empiric antibiotics.  N.p.o. PEG tube feeds.  COVID 19 test was negative a send out COVID-19 test has been sent. 2. Acute on chronic  kidney disease stage III.  Creatinine has increased from 1.8 last month and is 3.2.  Patient did receive fluids in the ER will monitor creatinine. 3. Hypertension on amlodipine. 4. History of stroke with PEG tube placement nonverbal.  Discussed with pharmacy about PEG tube feeds. 5. Diabetes mellitus type 2 weeks monitor CBGs.  [name] does not want patient to be on sliding scale coverage. 6. Anemia appears to be chronic with mild worsening.  Follow CBC. 7. History of nasopharyngeal carcinoma status post radiation.   DVT prophylaxis: Lovenox. Code Status: DNR. Family Communication: ER physician discussed with patient's wife. Disposition Plan: To be determined. Consults called: Palliative care. Admission status: Inpatient.   Rise Patience MD [facility] Pager [phone].  If 7PM-7AM, please contact night-coverage [email or web address] Password [email or web address]  11/21/2018, 1:46 AM

## 2018-11-21 NOTE — Progress Notes (Signed)
PHARMACY - PHYSICIAN COMMUNICATION CRITICAL VALUE ALERT - BLOOD CULTURE IDENTIFICATION (BCID)  Adam Yang is an 73 y.o. male who presented to [facility] on 11/19/2018 with a chief complaint of SOB. Patient given x 1 dose of aztreonam and then started empirically on vancomycin and zosyn. Patient's afebrile and WBC wnl.   Assessment:  Blood cultures from 5/11 growing GNR in 1 out of 3 bottles (BCID detected E. Coli - KPC gene not detected)  Name of physician (or Provider) Contacted: Flora Lipps, MD  Current antibiotics: Vancomycin and Zosyn  Changes to prescribed antibiotics recommended:  Discontinue Vancomycin and Zosyn Start Ceftriaxone 2g IV q24h   Results for orders placed or performed during the hospital encounter of 11/25/2018  Blood Culture ID Panel (Reflexed) (Collected: 11/27/2018  9:24 PM)  Result Value Ref Range   Enterococcus species NOT DETECTED NOT DETECTED   Listeria monocytogenes NOT DETECTED NOT DETECTED   Staphylococcus species NOT DETECTED NOT DETECTED   Staphylococcus aureus (BCID) NOT DETECTED NOT DETECTED   Streptococcus species NOT DETECTED NOT DETECTED   Streptococcus agalactiae NOT DETECTED NOT DETECTED   Streptococcus pneumoniae NOT DETECTED NOT DETECTED   Streptococcus pyogenes NOT DETECTED NOT DETECTED   Acinetobacter baumannii NOT DETECTED NOT DETECTED   Enterobacteriaceae species DETECTED (A) NOT DETECTED   Enterobacter cloacae complex NOT DETECTED NOT DETECTED   Escherichia coli DETECTED (A) NOT DETECTED   Klebsiella oxytoca NOT DETECTED NOT DETECTED   Klebsiella pneumoniae NOT DETECTED NOT DETECTED   Proteus species NOT DETECTED NOT DETECTED   Serratia marcescens NOT DETECTED NOT DETECTED   Carbapenem resistance NOT DETECTED NOT DETECTED   Haemophilus influenzae NOT DETECTED NOT DETECTED   Neisseria meningitidis NOT DETECTED NOT DETECTED   Pseudomonas aeruginosa NOT DETECTED NOT DETECTED   Candida albicans NOT DETECTED NOT DETECTED   Candida  glabrata NOT DETECTED NOT DETECTED   Candida krusei NOT DETECTED NOT DETECTED   Candida parapsilosis NOT DETECTED NOT DETECTED   Candida tropicalis NOT DETECTED NOT DETECTED    Thank you for allowing pharmacy to be a part of this patient's care.  Leron Croak, PharmD PGY1 Pharmacy Resident Phone: [phone]  Please check AMION for all [place] phone numbers 11/21/2018  12:52 PM

## 2018-11-22 DIAGNOSIS — Z66 Do not resuscitate: Secondary | ICD-10-CM

## 2018-11-22 DIAGNOSIS — Z515 Encounter for palliative care: Secondary | ICD-10-CM

## 2018-11-22 DIAGNOSIS — J690 Pneumonitis due to inhalation of food and vomit: Principal | ICD-10-CM

## 2018-11-22 DIAGNOSIS — A419 Sepsis, unspecified organism: Secondary | ICD-10-CM

## 2018-11-22 LAB — CBC
HCT: 27.5 % — ABNORMAL LOW (ref 39.0–52.0)
Hemoglobin: 8.5 g/dL — ABNORMAL LOW (ref 13.0–17.0)
MCH: 26.3 pg (ref 26.0–34.0)
MCHC: 30.9 g/dL (ref 30.0–36.0)
MCV: 85.1 fL (ref 80.0–100.0)
Platelets: 188 10*3/uL (ref 150–400)
RBC: 3.23 MIL/uL — ABNORMAL LOW (ref 4.22–5.81)
RDW: 18.6 % — ABNORMAL HIGH (ref 11.5–15.5)
WBC: 7.9 10*3/uL (ref 4.0–10.5)
nRBC: 0 % (ref 0.0–0.2)

## 2018-11-22 LAB — BASIC METABOLIC PANEL
Anion gap: 18 — ABNORMAL HIGH (ref 5–15)
BUN: 104 mg/dL — ABNORMAL HIGH (ref 8–23)
CO2: 28 mmol/L (ref 22–32)
Calcium: 8.8 mg/dL — ABNORMAL LOW (ref 8.9–10.3)
Chloride: 98 mmol/L (ref 98–111)
Creatinine, Ser: 3.91 mg/dL — ABNORMAL HIGH (ref 0.61–1.24)
GFR calc Af Amer: 17 mL/min — ABNORMAL LOW (ref >60)
GFR calc non Af Amer: 15 mL/min — ABNORMAL LOW (ref >60)
Glucose, Bld: 170 mg/dL — ABNORMAL HIGH (ref 70–99)
Potassium: 4.4 mmol/L (ref 3.5–5.1)
Sodium: 144 mmol/L (ref 135–145)

## 2018-11-22 LAB — NOVEL CORONAVIRUS, NAA (HOSP ORDER, SEND-OUT TO REF LAB; TAT 18-24 HRS): SARS-CoV-2, NAA: NOT DETECTED

## 2018-11-22 LAB — MAGNESIUM: Magnesium: 2.9 mg/dL — ABNORMAL HIGH (ref 1.7–2.4)

## 2018-11-22 MED ORDER — ORAL CARE MOUTH RINSE: 15.0000 mL | Freq: Two times a day (BID) | OROMUCOSAL | Status: DC

## 2018-11-22 MED ORDER — SCOPOLAMINE 1 MG/3DAYS TD PT72
1.0000 | MEDICATED_PATCH | TRANSDERMAL | Status: DC
  Administered 2018-11-22: 1.5 mg via TRANSDERMAL
  Filled 2018-11-22: qty 1

## 2018-11-22 MED ORDER — LORAZEPAM 2 MG/ML IJ SOLN
1.0000 mg | INTRAMUSCULAR | Status: DC | PRN
  Administered 2018-11-22: 1 mg via INTRAVENOUS
  Filled 2018-11-22: qty 1

## 2018-11-22 MED ORDER — BIOTENE DRY MOUTH MT LIQD: 15.0000 mL | OROMUCOSAL | Status: DC | PRN

## 2018-11-22 MED ORDER — MELATONIN 3 MG PO TABS
3.0000 mg | ORAL_TABLET | Freq: Every day | ORAL | Status: DC
  Administered 2018-11-22: 3 mg
  Filled 2018-11-22 (×2): qty 1

## 2018-11-22 MED ORDER — FREE WATER: 100.0000 mL | Freq: Three times a day (TID) | Status: DC | PRN

## 2018-11-22 MED ORDER — TIZANIDINE HCL 4 MG PO TABS
4.0000 mg | ORAL_TABLET | Freq: Every day | ORAL | Status: DC
  Administered 2018-11-22: 4 mg
  Filled 2018-11-22: qty 1

## 2018-11-22 MED ORDER — GLYCOPYRROLATE 0.2 MG/ML IJ SOLN
0.3000 mg | Freq: Once | INTRAMUSCULAR | Status: AC
  Administered 2018-11-22: 0.3 mg via INTRAVENOUS
  Filled 2018-11-22: qty 2

## 2018-11-22 MED ORDER — POLYVINYL ALCOHOL 1.4 % OP SOLN
1.0000 [drp] | Freq: Four times a day (QID) | OPHTHALMIC | Status: DC | PRN
  Filled 2018-11-22: qty 15

## 2018-11-22 MED ORDER — CHLORHEXIDINE GLUCONATE 0.12 % MT SOLN
15.0000 mL | Freq: Two times a day (BID) | OROMUCOSAL | Status: DC
  Administered 2018-11-22 (×2): 15 mL via OROMUCOSAL
  Filled 2018-11-22 (×2): qty 15

## 2018-11-22 MED ORDER — GLYCOPYRROLATE 0.2 MG/ML IJ SOLN: 0.3000 mg | INTRAMUSCULAR | Status: DC | PRN

## 2018-11-22 MED ORDER — ENSURE ENLIVE PO LIQD: 237.0000 mL | Freq: Two times a day (BID) | ORAL | Status: DC | PRN

## 2018-11-22 MED ORDER — METOPROLOL TARTRATE 12.5 MG HALF TABLET: 12.5000 mg | ORAL_TABLET | Freq: Two times a day (BID) | ORAL | Status: DC

## 2018-11-22 MED ORDER — MORPHINE 100MG IN NS 100ML (1MG/ML) PREMIX INFUSION
2.0000 mg/h | INTRAVENOUS | Status: DC
  Administered 2018-11-22: 2 mg/h via INTRAVENOUS
  Filled 2018-11-22 (×2): qty 100

## 2018-11-22 MED ORDER — MORPHINE BOLUS VIA INFUSION
2.0000 mg | INTRAVENOUS | Status: DC | PRN
  Filled 2018-11-22: qty 4

## 2018-11-23 LAB — CULTURE, BLOOD (ROUTINE X 2)

## 2018-11-24 LAB — CULTURE, RESPIRATORY

## 2018-11-24 LAB — CULTURE, RESPIRATORY W GRAM STAIN

## 2018-11-25 LAB — CULTURE, BLOOD (ROUTINE X 2): Culture: NO GROWTH

## 2018-12-11 NOTE — Discharge Summary (Signed)
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      Filed Weights   Nov 30, 2018 2106 11/21/18 0144 12/02/2018 2200  Weight: 71 kg 64.6 kg 64.6 kg    History of present illness: (per admitting MD Dr Hal Hope) PCP: Lawerance Cruel, MD  Patient coming from: Home.  Chief Complaint: Shortness of breath.  HPI: Adam Yang is a 73 y.o. 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  As per the wife who provided history the ER physician patient has been having some nausea vomiting last 3 days and became hypoxic.  Patient's family member also was sick last week.  CODE STATUS was not known at this time.  Patient has been having recent admission for aspiration.  ED Course: In the ER patient was hypoxic requiring 100% nonrebreather.  Pulmonary critical care was consulted at this time after discussion with patient wife patient was made a DNR but continue with present medication.  Chest x-ray shows congestive pattern and acute abdominal series was unremarkable.  Patient was  started on empiric antibiotics and admitted for further management of acute respiratory failure likely from aspiration.  Morphine also was started for comfort.  Hospital Course:  Principal Problem:   Acute respiratory failure with hypoxia (HCC) Active Problems:   History of nasopharyngeal cancer   Type 2 diabetes mellitus, controlled, with renal complications (HCC)   Status post insertion of percutaneous endoscopic gastrostomy (PEG) tube (HCC)   Essential hypertension   Diabetes mellitus type 2 in nonobese [facility])   Hemiparesis affecting right side as late effect of stroke (HCC)   Dysphagia, post-stroke   Recurrent aspiration pneumonia (HCC)   Recurrent aspiration pneumonia/acute hypoxic respiratory failure, baseline not 02 dependent,  SARS-cov2 test negative  -he was recently hospitalized from 4/15 to 4/17 for aspiration pneumonia -he is deteriorating,despite abx/frequent suctio -palliative care currently is actively working with family to arrange family to come in ,patient is transitioned to full comfort mode and was started on morphine drip, family was able to come in to visit -patient passed away on Dec 02, 2022    AKI on CKDIII, Anemia of chronic disease Baseline Cr 1.8  Cr on admission 3.28 Appear dry, free water per peg tube, supportive measures patient is transitioned to full comfort mode   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 S/p chemo/radiation, With baseline dysphagis 2/2 XRT prior to CVA  Followed by Dr. Alvy Bimler and Dr.  Redmond Baseman (ENT)    Code Status: DNR   Consultants:  Palliative care  Procedures:  Nasotracheal suction   Antibiotics:  rocephin     Allergies as of Dec 01, 2018      Reactions    Cefepime    Urticarial rash - unclear cause, had been receiving cefepime for several days and had also had flagyl as well   Flagyl [metronidazole]    Urticarial rash - unclear, was also receiving cefepime      Medication List    STOP taking these medications   acetaminophen 500 MG tablet Commonly known as:  TYLENOL   amLODipine 5 MG tablet Commonly known as:  NORVASC   antiseptic oral rinse Liqd   chlorhexidine 0.12 % solution Commonly known as:  PERIDEX   feeding supplement (GLUCERNA 1.5 CAL) Liqd   feeding supplement (PRO-STAT SUGAR FREE 64) Liqd   free water Soln   guaiFENesin 100 MG/5ML Soln Commonly known as:  ROBITUSSIN   loratadine 10 MG tablet Commonly known as:  CLARITIN   LORazepam 1 MG tablet Commonly known as:  ATIVAN   Melatonin 5 MG Tabs   methylphenidate 5 MG tablet Commonly known as:  RITALIN   ondansetron 8 MG tablet Commonly known as:  ZOFRAN   OXYGEN   polyethylene glycol 17 g packet Commonly known as:  MIRALAX / GLYCOLAX   silver sulfADIAZINE 1 % cream Commonly known as:  SILVADENE   tiZANidine 4 MG tablet Commonly known as:  ZANAFLEX      Allergies  Allergen Reactions   Cefepime     Urticarial rash - unclear cause, had been receiving cefepime for several days and had also had flagyl as well   Flagyl [Metronidazole]     Urticarial rash - unclear, was also receiving cefepime       The results of significant diagnostics from this hospitalization (including imaging, microbiology, ancillary and laboratory) are listed below for reference.    Significant Diagnostic Studies: Dg Abdomen 1 View  Result Date: 11/13/2018 CLINICAL DATA:  Hypoxia and shortness of breath. EXAM: ABDOMEN - 1 VIEW COMPARISON:  None. FINDINGS: The bowel gas pattern is normal. No radio-opaque calculi or other significant radiographic abnormality are seen. IMPRESSION: Negative. Electronically Signed   By: Ulyses Jarred M.D.   On: 11/17/2018 21:15   Ir Replc  Gastro/colonic Tube Percut W/fluoro  Result Date: 10/26/2018 INDICATION: 73 year old male with chronic indwelling percutaneous gastrostomy tube. He presents today with a damaged and nonfunctioning tube. EXAM: GASTROSTOMY CATHETER REPLACEMENT MEDICATIONS: None ANESTHESIA/SEDATION: None CONTRAST:  5 mL Omnipaque 300-administered into the gastric lumen. FLUOROSCOPY TIME:  Fluoroscopy Time: 0 minutes 6 seconds (1 mGy). COMPLICATIONS: None immediate. PROCEDURE: Informed written consent was obtained from the patient after a thorough discussion of the procedural risks, benefits and alternatives. All questions were addressed. Maximal Sterile Barrier Technique was utilized including caps, mask, sterile gowns, sterile gloves, sterile drape, hand hygiene and skin antiseptic. A timeout was performed prior to the initiation of the procedure. The balloon could not be deflated on the existing gastrostomy tube secondary to damage to the internal portions of the tube. Therefore, a 22 gauge needle was carefully advanced down along the tube tract and used to puncture the balloon directly aspirating the internal fluid. The tube was then successfully removed. A new 20 x 2.3 cm mini one Mickey G-tube was then lubricated in advanced through the existing tract. The retention balloon was inflated with 10 mL saline. A hand injection of contrast material was performed confirming  that the tube is within the gastric lumen. The tube was then flushed with saline. IMPRESSION: Successful exchange of damaged percutaneous gastrostomy tube. Please note that the new device implanted was the patient's own personal device brought from home. Electronically Signed   By: Jacqulynn Cadet M.D.   On: 10/26/2018 17:44   Dg Chest Port 1 View  Result Date: 12/08/2018 CLINICAL DATA:  Hypoxia, shortness of breath. EXAM: PORTABLE CHEST 1 VIEW COMPARISON:  10/26/2018 FINDINGS: Normal heart size. Bilateral pleural effusions with diminished aeration to the lung  bases noted. Pulmonary vascular congestion noted. Visualized osseous structures are unremarkable. IMPRESSION: 1. Congestive heart failure. 2. Diminished aeration to both lung bases. Electronically Signed   By: Kerby Moors M.D.   On: 12/06/2018 21:16   Dg Chest Port 1 View  Result Date: 10/26/2018 CLINICAL DATA:  Acute respiratory failure with hypoxia. EXAM: PORTABLE CHEST 1 VIEW COMPARISON:  Radiograph of October 25, 2018. FINDINGS: The heart size and mediastinal contours are within normal limits. No pneumothorax or pleural effusion is noted. Mild bibasilar subsegmental atelectasis is noted. The visualized skeletal structures are unremarkable. IMPRESSION: Mild bibasilar subsegmental atelectasis. Electronically Signed   By: Marijo Conception M.D.   On: 10/26/2018 16:32   Dg Chest Port 1 View  Result Date: 10/25/2018 CLINICAL DATA:  Shortness of breath EXAM: PORTABLE CHEST 1 VIEW COMPARISON:  10/23/2010 FINDINGS: Bilateral mild interstitial thickening. Bibasilar airspace disease likely reflecting atelectasis. No significant pleural effusions. No pneumothorax. Stable cardiomediastinal silhouette. No aggressive osseous lesion. IMPRESSION: Mild bilateral interstitial thickening likely reflecting mild interstitial edema. Bibasilar atelectasis. Electronically Signed   By: Kathreen Devoid   On: 10/25/2018 13:41    Microbiology: Recent Results (from the past 240 hour(s))  Urine culture     Status: Abnormal   Collection Time: 12/10/2018  8:41 PM  Result Value Ref Range Status   Specimen Description URINE, RANDOM  Final   Special Requests   Final    NONE Performed at [facility], [address] [place], [place] [address]    Culture MULTIPLE SPECIES PRESENT, SUGGEST RECOLLECTION (A)  Final   Report Status 11/21/2018 FINAL  Final  SARS Coronavirus 2 (CEPHEID- Performed in [place] hospital lab), Hosp Order     Status: None   Collection Time: 12/05/2018  9:19 PM  Result Value Ref Range Status   SARS  Coronavirus 2 NEGATIVE NEGATIVE Final    Comment: (NOTE) If result is NEGATIVE SARS-CoV-2 target nucleic acids are NOT DETECTED. The SARS-CoV-2 RNA is generally detectable in upper and lower  respiratory specimens during the acute phase of infection. The lowest  concentration of SARS-CoV-2 viral copies this assay can detect is 250  copies / mL. A negative result does not preclude SARS-CoV-2 infection  and should not be used as the sole basis for treatment or other  patient management decisions.  A negative result may occur with  improper specimen collection / handling, submission of specimen other  than nasopharyngeal swab, presence of viral mutation(s) within the  areas targeted by this assay, and inadequate number of viral copies  (<250 copies / mL). A negative result must be combined with clinical  observations, patient history, and epidemiological information. If result is POSITIVE SARS-CoV-2 target nucleic acids are DETECTED. The SARS-CoV-2 RNA is generally detectable in upper and lower  respiratory specimens dur ing the acute phase of infection.  Positive  results are indicative of active infection with SARS-CoV-2.  Clinical  correlation with patient history and  other diagnostic information is  necessary to determine patient infection status.  Positive results do  not rule out bacterial infection or co-infection with other viruses. If result is PRESUMPTIVE POSTIVE SARS-CoV-2 nucleic acids MAY BE PRESENT.   A presumptive positive result was obtained on the submitted specimen  and confirmed on repeat testing.  While 2019 novel coronavirus  (SARS-CoV-2) nucleic acids may be present in the submitted sample  additional confirmatory testing may be necessary for epidemiological  and / or clinical management purposes  to differentiate between  SARS-CoV-2 and other Sarbecovirus currently known to infect humans.  If clinically indicated additional testing with an alternate test    methodology [phone]) is advised. The SARS-CoV-2 RNA is generally  detectable in upper and lower respiratory sp ecimens during the acute  phase of infection. The expected result is Negative. Fact Sheet for Patients:  StrictlyIdeas.no Fact Sheet for Healthcare Providers: BankingDealers.co.za This test is not yet approved or cleared by the [place] FDA and has been authorized for detection and/or diagnosis of SARS-CoV-2 by FDA under an Emergency Use Authorization (EUA).  This EUA will remain in effect (meaning this test can be used) for the duration of the COVID-19 declaration under Section 564(b)(1) of the Act, 21 U.S.C. section 360bbb-3(b)(1), unless the authorization is terminated or revoked sooner. Performed at [facility], [place] [address], [place], [place] [address]   Respiratory Panel by PCR     Status: None   Collection Time: 12/04/2018  9:19 PM  Result Value Ref Range Status   Adenovirus NOT DETECTED NOT DETECTED Final   Coronavirus 229E NOT DETECTED NOT DETECTED Final    Comment: (NOTE) The Coronavirus on the Respiratory Panel, DOES NOT test for the novel  Coronavirus (2019 nCoV)    Coronavirus HKU1 NOT DETECTED NOT DETECTED Final   Coronavirus NL63 NOT DETECTED NOT DETECTED Final   Coronavirus OC43 NOT DETECTED NOT DETECTED Final   Metapneumovirus NOT DETECTED NOT DETECTED Final   Rhinovirus / Enterovirus NOT DETECTED NOT DETECTED Final   Influenza A NOT DETECTED NOT DETECTED Final   Influenza B NOT DETECTED NOT DETECTED Final   Parainfluenza Virus 1 NOT DETECTED NOT DETECTED Final   Parainfluenza Virus 2 NOT DETECTED NOT DETECTED Final   Parainfluenza Virus 3 NOT DETECTED NOT DETECTED Final   Parainfluenza Virus 4 NOT DETECTED NOT DETECTED Final   Respiratory Syncytial Virus NOT DETECTED NOT DETECTED Final   Bordetella pertussis NOT DETECTED NOT DETECTED Final   Chlamydophila pneumoniae NOT DETECTED NOT DETECTED  Final   Mycoplasma pneumoniae NOT DETECTED NOT DETECTED Final    Comment: Performed at [facility], [place], [place], [place] [address]  Novel Coronavirus,NAA,(SEND-OUT TO REF LAB - TAT 24-48 hrs); Hosp Order     Status: None   Collection Time: 11/24/2018  9:19 PM  Result Value Ref Range Status   SARS-CoV-2, NAA NOT DETECTED NOT DETECTED Final    Comment: (NOTE) This test was developed and its performance characteristics determined by [employer]. This test has not been FDA cleared or approved. This test has been authorized by FDA under an Emergency Use Authorization (EUA). This test is only authorized for the duration of time the declaration that circumstances exist justifying the authorization of the emergency use of in vitro diagnostic tests for detection of SARS-CoV-2 virus and/or diagnosis of COVID-19 infection under section 564(b)(1) of the Act, 21 U.S.C. 220URK-2(H)(0), unless the authorization is terminated or revoked sooner. When diagnostic testing is negative,  the possibility of a false negative result should be considered in the context of a patient's recent exposures and the presence of clinical signs and symptoms consistent with COVID-19. An individual without symptoms of COVID-19 and who is not shedding SARS-CoV-2 virus would expect to have a negative (not detected) result in this assay. Performed  At: [facility] [address] [place], [place] [id number] Rush Farmer MD KT:[id number]    [place]  Final    Comment: Performed at [facility], [place] [address], [place], [place] [address]  Blood Culture (routine x 2)     Status: Abnormal   Collection Time: 11/16/2018  9:24 PM  Result Value Ref Range Status   Specimen Description BLOOD RIGHT WRIST  Final   Special Requests   Final    BOTTLES DRAWN AEROBIC ONLY Blood Culture results may not be optimal due to an inadequate volume of blood received in culture  bottles   Culture  Setup Time   Final    GRAM NEGATIVE RODS AEROBIC BOTTLE ONLY CRITICAL RESULT CALLED TO, READ BACK BY AND VERIFIED WITH: Asencion Gowda PharmD 12:50 11/21/18 ([email or web address]) Performed at [facility], [address], [place], [place] [address]    Culture ESCHERICHIA COLI (A)  Final   Report Status 11/23/2018 FINAL  Final   Organism ID, Bacteria ESCHERICHIA COLI  Final      Susceptibility   Escherichia coli - MIC*    AMPICILLIN >=32 RESISTANT Resistant     CEFAZOLIN <=4 SENSITIVE Sensitive     CEFEPIME <=1 SENSITIVE Sensitive     CEFTAZIDIME <=1 SENSITIVE Sensitive     CEFTRIAXONE <=1 SENSITIVE Sensitive     CIPROFLOXACIN >=4 RESISTANT Resistant     GENTAMICIN <=1 SENSITIVE Sensitive     IMIPENEM <=0.25 SENSITIVE Sensitive     TRIMETH/SULFA >=320 RESISTANT Resistant     AMPICILLIN/SULBACTAM 16 INTERMEDIATE Intermediate     PIP/TAZO <=4 SENSITIVE Sensitive     Extended ESBL NEGATIVE Sensitive     * ESCHERICHIA COLI  Blood Culture ID Panel (Reflexed)     Status: Abnormal   Collection Time: 11/27/2018  9:24 PM  Result Value Ref Range Status   Enterococcus species NOT DETECTED NOT DETECTED Final   Listeria monocytogenes NOT DETECTED NOT DETECTED Final   Staphylococcus species NOT DETECTED NOT DETECTED Final   Staphylococcus aureus (BCID) NOT DETECTED NOT DETECTED Final   Streptococcus species NOT DETECTED NOT DETECTED Final   Streptococcus agalactiae NOT DETECTED NOT DETECTED Final   Streptococcus pneumoniae NOT DETECTED NOT DETECTED Final   Streptococcus pyogenes NOT DETECTED NOT DETECTED Final   Acinetobacter baumannii NOT DETECTED NOT DETECTED Final   Enterobacteriaceae species DETECTED (A) NOT DETECTED Final    Comment: Enterobacteriaceae represent a large family of gram-negative bacteria, not a single organism. CRITICAL RESULT CALLED TO, READ BACK BY AND VERIFIED WITH: Asencion Gowda PharmD 12:50 11/21/18 ([email or web address])    Enterobacter cloacae complex NOT DETECTED NOT  DETECTED Final   Escherichia coli DETECTED (A) NOT DETECTED Final    Comment: CRITICAL RESULT CALLED TO, READ BACK BY AND VERIFIED WITH: Asencion Gowda PharmD 12:50 11/21/18 ([email or web address])    Klebsiella oxytoca NOT DETECTED NOT DETECTED Final   Klebsiella pneumoniae NOT DETECTED NOT DETECTED Final   Proteus species NOT DETECTED NOT DETECTED Final   Serratia marcescens NOT DETECTED NOT DETECTED Final   Carbapenem resistance NOT DETECTED NOT DETECTED Final   Haemophilus influenzae NOT DETECTED NOT DETECTED Final   Neisseria meningitidis NOT  DETECTED NOT DETECTED Final   Pseudomonas aeruginosa NOT DETECTED NOT DETECTED Final   Candida albicans NOT DETECTED NOT DETECTED Final   Candida glabrata NOT DETECTED NOT DETECTED Final   Candida krusei NOT DETECTED NOT DETECTED Final   Candida parapsilosis NOT DETECTED NOT DETECTED Final   Candida tropicalis NOT DETECTED NOT DETECTED Final    Comment: Performed at [facility], [place] [address] [place], [place] [address]  Blood Culture (routine x 2)     Status: None (Preliminary result)   Collection Time: 11/21/2018  9:40 PM  Result Value Ref Range Status   Specimen Description BLOOD LEFT ANTECUBITAL  Final   Special Requests   Final    BOTTLES DRAWN AEROBIC AND ANAEROBIC Blood Culture results may not be optimal due to an inadequate volume of blood received in culture bottles   Culture   Final    NO GROWTH 3 DAYS Performed at [facility], [place] [address], [place], [place] [address]    Report Status PENDING  Incomplete  Expectorated sputum assessment w rflx to resp cult     Status: None   Collection Time: 11/21/18 12:42 AM  Result Value Ref Range Status   Specimen Description SPUTUM  Final   Special Requests NONE  Final   Sputum evaluation   Final    THIS SPECIMEN IS ACCEPTABLE FOR SPUTUM CULTURE Performed at [facility], [place] [address] [place], [place] [address]    Report Status 11/21/2018 FINAL  Final  Culture, respiratory      Status: None (Preliminary result)   Collection Time: 11/21/18 12:42 AM  Result Value Ref Range Status   Specimen Description SPUTUM  Final   Special Requests NONE Reflexed from [id number]  Final   Gram Stain   Final    FEW WBC PRESENT, PREDOMINANTLY PMN ABUNDANT GRAM NEGATIVE RODS FEW GRAM POSITIVE RODS RARE GRAM POSITIVE COCCI    Culture   Final    MODERATE GRAM NEGATIVE RODS CULTURE REINCUBATED FOR BETTER GROWTH Performed at [place] [facility], [place] [address] [place], [place] [address]    Report Status PENDING  Incomplete  SARS Coronavirus 2 (CEPHEID- Performed in [place] hospital lab), Hosp Order     Status: None   Collection Time: 11/21/18  4:54 AM  Result Value Ref Range Status   SARS Coronavirus 2 NEGATIVE NEGATIVE Final    Comment: (NOTE) If result is NEGATIVE SARS-CoV-2 target nucleic acids are NOT DETECTED. The SARS-CoV-2 RNA is generally detectable in upper and lower  respiratory specimens during the acute phase of infection. The lowest  concentration of SARS-CoV-2 viral copies this assay can detect is 250  copies / mL. A negative result does not preclude SARS-CoV-2 infection  and should not be used as the sole basis for treatment or other  patient management decisions.  A negative result may occur with  improper specimen collection / handling, submission of specimen other  than nasopharyngeal swab, presence of viral mutation(s) within the  areas targeted by this assay, and inadequate number of viral copies  (<250 copies / mL). A negative result must be combined with clinical  observations, patient history, and epidemiological information. If result is POSITIVE SARS-CoV-2 target nucleic acids are DETECTED. The SARS-CoV-2 RNA is generally detectable in upper and lower  respiratory specimens dur ing the acute phase of infection.  Positive  results are indicative of active infection with SARS-CoV-2.  Clinical  correlation with patient history and other diagnostic  information is  necessary to determine patient infection status.  Positive results do  not rule out bacterial infection or co-infection with other viruses. If result is PRESUMPTIVE POSTIVE SARS-CoV-2 nucleic acids MAY BE PRESENT.   A presumptive positive result was obtained on the submitted specimen  and confirmed on repeat testing.  While 2019 novel coronavirus  (SARS-CoV-2) nucleic acids may be present in the submitted sample  additional confirmatory testing may be necessary for epidemiological  and / or clinical management purposes  to differentiate between  SARS-CoV-2 and other Sarbecovirus currently known to infect humans.  If clinically indicated additional testing with an alternate test  methodology [phone]) is advised. The SARS-CoV-2 RNA is generally  detectable in upper and lower respiratory sp ecimens during the acute  phase of infection. The expected result is Negative. Fact Sheet for Patients:  StrictlyIdeas.no Fact Sheet for Healthcare Providers: BankingDealers.co.za This test is not yet approved or cleared by the [place] FDA and has been authorized for detection and/or diagnosis of SARS-CoV-2 by FDA under an Emergency Use Authorization (EUA).  This EUA will remain in effect (meaning this test can be used) for the duration of the COVID-19 declaration under Section 564(b)(1) of the Act, 21 U.S.C. section 360bbb-3(b)(1), unless the authorization is terminated or revoked sooner. Performed at [facility], [place] [address], [place], [place] [address]      Labs: Basic Metabolic Panel: Recent Labs  Lab 12/02/2018 2110 11/13/2018 2120 11/11/2018 2157 11/21/18 0336 2018/12/04 0322  NA 137 136 137 139 144  K 4.2 4.1 4.2 4.5 4.4  CL 93*  --   --  100 98  CO2 26  --   --  25 28  GLUCOSE 157*  --   --  168* 170*  BUN 83*  --   --  79* 104*  CREATININE 3.28*  --   --  3.18* 3.91*  CALCIUM 9.0  --   --  8.5* 8.8*  MG  --    --   --   --  2.9*   Liver Function Tests: Recent Labs  Lab 11/30/2018 2110  AST 37  ALT 20  ALKPHOS 70  BILITOT 0.6  PROT 7.1  ALBUMIN 2.4*   No results for input(s): LIPASE, AMYLASE in the last 168 hours. No results for input(s): AMMONIA in the last 168 hours. CBC: Recent Labs  Lab 11/19/2018 2110 11/25/2018 2120 11/19/2018 2157 11/21/18 0336 12-04-18 0322  WBC 6.9  --   --  4.7 7.9  NEUTROABS 6.3  --   --   --   --   HGB 8.9* 13.9 9.2* 7.7* 8.5*  HCT 28.9* 41.0 27.0* 25.3* 27.5*  MCV 84.8  --   --  84.1 85.1  PLT 200  --   --  196 188   Cardiac Enzymes: No results for input(s): CKTOTAL, CKMB, CKMBINDEX, TROPONINI in the last 168 hours. BNP: BNP (last 3 results) Recent Labs    03/06/18 1130 10/23/18 0850  BNP 96.3 87.9    ProBNP (last 3 results) No results for input(s): PROBNP in the last 8760 hours.  CBG: Recent Labs  Lab 11/21/18 0355  GLUCAP 157*       Signed:  Florencia Reasons MD, PhD  [facility] 11/23/2018, 5:26 PM

## 2018-12-11 NOTE — Progress Notes (Signed)
Palliative Note:   Wife and daughter are at the bedside. Wife sitting in bathroom requesting to talk in there as she did not want to come out and sit in patient's room. Discussion with wife and daughter on patient's condition. Mr. Vitelli "Marden Noble" looks more relaxed and comfortable compared to previous assessment prior to morphine.   Wife tearful and spent time sharing memories of her and her husband. Comfort and support provided. Daughter shared memories as well.   Wife expressed appreciation of support and care given. She verbalized she is comfortable with her decisions, however she was hopeful that she had more time with him. She expressed "I know he is tired, his body is tired, and he is ready to go to heaven!" Support given.   Arnette Norris at the bedside providing support and therapeutic listening. Wife requested prayer. She also requested citation of the Lord's Prayer, and to sing How Great Thou Art. Chaplain and myself supported wife in her request.   Wife expressed he loved music. Discussed with bedside RN and requested tv be turned to musical station for support of family and patient.   Followed up with Michala and Dr. Erlinda Hong regarding comfort orders and care. Patient continues on NRB, however once family has left bedside, RN aware to place on nasal cannula for comfort only!   Time In: 1345 Time Out: 1445 Total Time:  60 min.   Greater than 50%  of this time was spent counseling and coordinating care related to the above assessment and plan.  Alda Lea, AGPCNP-BC Palliative Medicine Team  Phone: [phone] Pager: [phone] Amion: Bjorn Pippin

## 2018-12-11 NOTE — Progress Notes (Signed)
PROGRESS NOTE  Adam Yang WUJ:[id number] DOB: 11-13-1945 DOA: 12/03/2018 PCP: Lawerance Cruel, MD  HPI/Recap of past 24 hours:  He required frequent suction with copious amounts of secretions obtained,  tmax 101,  sats is dropping in NRB, remain tachycardia,  He is alert, does not follow commands, meds per peg tube,  He appear dry  Assessment/Plan: Principal Problem:   Acute respiratory failure with hypoxia ([place]) Active Problems:   History of nasopharyngeal cancer   Type 2 diabetes mellitus, controlled, with renal complications ([place])   Status post insertion of percutaneous endoscopic gastrostomy (PEG) tube ([place])   Essential hypertension   Diabetes mellitus type 2 in nonobese [facility])   Hemiparesis affecting right side as late effect of stroke ([place])   Dysphagia, post-stroke  Recurrent aspiration pneumonia/acute hypoxic respiratory failure, baseline not 02 dependent -he was recently hospitalized from 4/15 to 4/17 for aspiration pneumonia -he is deteriorating, continue abx/frequent suction for now, palliative care currently is actively working with family to arrange family to come in , continue goals of care discussion, patient will benefit comfort measures, awaiting family desicion    AKI on CKDIII, Anemia of chronic disease Baseline Cr 1.8  Cr on admission 3.28 Appear dry, free water per peg tube, supportive measures, continue goals of care   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 S/p chemo/radiation, With baseline dysphagis 2/2 XRT prior to CVA  Followed by Dr. Alvy Bimler and Dr. Redmond Baseman (ENT)  FTT: poor prognosis  Code Status: DNR  Family Communication: patient   Disposition Plan: pending goals of care discussion, will  follow palliative care recommendation   Consultants:  Palliative care  Procedures:  Nasotracheal suction   Antibiotics:  rocephin   Objective: BP 109/64 (BP Location: Right Arm)    Pulse (!) 114    Temp 100.2 F (37.9 C) (Axillary)    Resp 17    Ht 5\' 9"  (1.753 m)    Wt 64.6 kg    SpO2 93%    BMI 21.03 kg/m   Intake/Output Summary (Last 24 hours) at 11/24/2018 0752 Last data filed at 11/24/2018 0413 Gross per 24 hour  Intake 100.48 ml  Output 250 ml  Net -149.52 ml   Filed Weights   11/21/2018 2106 11/21/18 0144  Weight: 71 kg 64.6 kg    Exam: Patient is examined daily including today on 2018-11-24, exams remain the same as of yesterday except that has changed    General:  Frail, chronically ill, alert, not following commands, on NRB, audible rhonchi   Cardiovascular: tachycardia  Respiratory: + rhonchi  Abdomen: Soft/ND/NT, positive BS, + peg tube  Musculoskeletal: No Edema  Neuro: alert, not following commands, nonverbal, right hemiplegia   Data Reviewed: Basic Metabolic Panel: Recent Labs  Lab 11/18/2018 2110 12/04/2018 2120 12/02/2018 2157 11/21/18 0336 2018/11/24 0322  NA 137 136 137 139 144  K 4.2 4.1 4.2 4.5 4.4  CL 93*  --   --  100 98  CO2 26  --   --  25 28  GLUCOSE 157*  --   --  168* 170*  BUN 83*  --   --  79* 104*  CREATININE 3.28*  --   --  3.18* 3.91*  CALCIUM 9.0  --   --  8.5* 8.8*  MG  --   --   --   --  2.9*   Liver Function Tests: Recent Labs  Lab 12/07/2018 2110  AST 37  ALT 20  ALKPHOS 70  BILITOT 0.6  PROT 7.1  ALBUMIN 2.4*   No results for input(s): LIPASE, AMYLASE in the last 168 hours. No results for input(s): AMMONIA in the last 168 hours. CBC: Recent Labs  Lab 11/17/2018 2110 11/27/2018 2120 11/16/2018 2157 11/21/18 0336 2018/11/24 0322  WBC 6.9  --   --  4.7 7.9  NEUTROABS 6.3  --   --   --   --   HGB 8.9* 13.9 9.2* 7.7* 8.5*  HCT 28.9* 41.0 27.0* 25.3* 27.5*  MCV 84.8  --   --  84.1 85.1  PLT 200  --   --  196 188     Cardiac Enzymes:   No results for input(s): CKTOTAL, CKMB, CKMBINDEX, TROPONINI in the last 168 hours. BNP (last 3 results) Recent Labs    03/06/18 1130 10/23/18 0850  BNP 96.3 87.9    ProBNP (last 3 results) No results for input(s): PROBNP in the last 8760 hours.  CBG: Recent Labs  Lab 11/21/18 0355  GLUCAP 157*    Recent Results (from the past 240 hour(s))  Urine culture     Status: Abnormal   Collection Time: 11/24/2018  8:41 PM  Result Value Ref Range Status   Specimen Description URINE, RANDOM  Final   Special Requests   Final    NONE Performed at [facility], [place] [address] [place], [place] [address]    Culture MULTIPLE SPECIES PRESENT, SUGGEST RECOLLECTION (A)  Final   Report Status 11/21/2018 FINAL  Final  SARS Coronavirus 2 (CEPHEID- Performed in [place] hospital lab), Hosp Order     Status: None   Collection Time: 11/19/2018  9:19 PM  Result Value Ref Range Status   SARS Coronavirus 2 NEGATIVE NEGATIVE Final    Comment: (NOTE) If result is NEGATIVE SARS-CoV-2 target nucleic acids are NOT DETECTED. The SARS-CoV-2 RNA is generally detectable in upper and lower  respiratory specimens during the acute phase of infection. The lowest  concentration of SARS-CoV-2 viral copies this assay can detect is 250  copies / mL. A negative result does not preclude SARS-CoV-2 infection  and should not be used as the sole basis for treatment or other  patient management decisions.  A negative result may occur with  improper specimen collection / handling, submission of specimen other  than nasopharyngeal swab, presence of viral mutation(s) within the  areas targeted by this assay, and inadequate number of viral copies  (<250 copies / mL). A negative result must be combined with clinical  observations, patient history, and epidemiological information. If result is POSITIVE SARS-CoV-2 target nucleic acids are DETECTED. The SARS-CoV-2 RNA is generally detectable in  upper and lower  respiratory specimens dur ing the acute phase of infection.  Positive  results are indicative of active infection with SARS-CoV-2.  Clinical  correlation with patient history and other diagnostic information is  necessary to determine patient infection status.  Positive results do  not rule out bacterial infection or co-infection with other viruses. If result is PRESUMPTIVE POSTIVE SARS-CoV-2 nucleic acids MAY BE PRESENT.   A presumptive positive result was obtained on the submitted specimen  and confirmed on repeat testing.  While 2019 novel coronavirus  (SARS-CoV-2) nucleic acids may be present in the submitted sample  additional confirmatory  testing may be necessary for epidemiological  and / or clinical management purposes  to differentiate between  SARS-CoV-2 and other Sarbecovirus currently known to infect humans.  If clinically indicated additional testing with an alternate test  methodology [phone]) is advised. The SARS-CoV-2 RNA is generally  detectable in upper and lower respiratory sp ecimens during the acute  phase of infection. The expected result is Negative. Fact Sheet for Patients:  StrictlyIdeas.no Fact Sheet for Healthcare Providers: BankingDealers.co.za This test is not yet approved or cleared by the [place] FDA and has been authorized for detection and/or diagnosis of SARS-CoV-2 by FDA under an Emergency Use Authorization (EUA).  This EUA will remain in effect (meaning this test can be used) for the duration of the COVID-19 declaration under Section 564(b)(1) of the Act, 21 U.S.C. section 360bbb-3(b)(1), unless the authorization is terminated or revoked sooner. Performed at [facility], [place] [address], [place], [place] [address]   Respiratory Panel by PCR     Status: None   Collection Time: 12/07/2018  9:19 PM  Result Value Ref Range Status   Adenovirus NOT DETECTED NOT DETECTED Final     Coronavirus 229E NOT DETECTED NOT DETECTED Final    Comment: (NOTE) The Coronavirus on the Respiratory Panel, DOES NOT test for the novel  Coronavirus (2019 nCoV)    Coronavirus HKU1 NOT DETECTED NOT DETECTED Final   Coronavirus NL63 NOT DETECTED NOT DETECTED Final   Coronavirus OC43 NOT DETECTED NOT DETECTED Final   Metapneumovirus NOT DETECTED NOT DETECTED Final   Rhinovirus / Enterovirus NOT DETECTED NOT DETECTED Final   Influenza A NOT DETECTED NOT DETECTED Final   Influenza B NOT DETECTED NOT DETECTED Final   Parainfluenza Virus 1 NOT DETECTED NOT DETECTED Final   Parainfluenza Virus 2 NOT DETECTED NOT DETECTED Final   Parainfluenza Virus 3 NOT DETECTED NOT DETECTED Final   Parainfluenza Virus 4 NOT DETECTED NOT DETECTED Final   Respiratory Syncytial Virus NOT DETECTED NOT DETECTED Final   Bordetella pertussis NOT DETECTED NOT DETECTED Final   Chlamydophila pneumoniae NOT DETECTED NOT DETECTED Final   Mycoplasma pneumoniae NOT DETECTED NOT DETECTED Final    Comment: Performed at [facility], [place]. [address] [name], [place] [address]  Blood Culture (routine x 2)     Status: None (Preliminary result)   Collection Time: 12/10/2018  9:24 PM  Result Value Ref Range Status   Specimen Description BLOOD RIGHT WRIST  Final   Special Requests   Final    BOTTLES DRAWN AEROBIC ONLY Blood Culture results may not be optimal due to an inadequate volume of blood received in culture bottles   Culture  Setup Time   Final    GRAM NEGATIVE RODS AEROBIC BOTTLE ONLY Organism ID to follow CRITICAL RESULT CALLED TO, READ BACK BY AND VERIFIED WITH: Asencion Gowda PharmD 12:50 11/21/18 ([email or web address])    Culture   Final    NO GROWTH < 24 HOURS Performed at [facility], [place] [address], [place], [place] [address]    Report Status PENDING  Incomplete  Blood Culture ID Panel (Reflexed)     Status: Abnormal   Collection Time: 12/03/2018  9:24 PM  Result Value Ref Range Status   Enterococcus  species NOT DETECTED NOT DETECTED Final   Listeria monocytogenes NOT DETECTED NOT DETECTED Final   Staphylococcus species NOT DETECTED NOT DETECTED Final   Staphylococcus aureus (BCID) NOT DETECTED NOT DETECTED Final   Streptococcus species NOT DETECTED NOT  DETECTED Final   Streptococcus agalactiae NOT DETECTED NOT DETECTED Final   Streptococcus pneumoniae NOT DETECTED NOT DETECTED Final   Streptococcus pyogenes NOT DETECTED NOT DETECTED Final   Acinetobacter baumannii NOT DETECTED NOT DETECTED Final   Enterobacteriaceae species DETECTED (A) NOT DETECTED Final    Comment: Enterobacteriaceae represent a large family of gram-negative bacteria, not a single organism. CRITICAL RESULT CALLED TO, READ BACK BY AND VERIFIED WITH: Asencion Gowda PharmD 12:50 11/21/18 ([email or web address])    Enterobacter cloacae complex NOT DETECTED NOT DETECTED Final   Escherichia coli DETECTED (A) NOT DETECTED Final    Comment: CRITICAL RESULT CALLED TO, READ BACK BY AND VERIFIED WITH: Asencion Gowda PharmD 12:50 11/21/18 ([email or web address])    Klebsiella oxytoca NOT DETECTED NOT DETECTED Final   Klebsiella pneumoniae NOT DETECTED NOT DETECTED Final   Proteus species NOT DETECTED NOT DETECTED Final   Serratia marcescens NOT DETECTED NOT DETECTED Final   Carbapenem resistance NOT DETECTED NOT DETECTED Final   Haemophilus influenzae NOT DETECTED NOT DETECTED Final   Neisseria meningitidis NOT DETECTED NOT DETECTED Final   Pseudomonas aeruginosa NOT DETECTED NOT DETECTED Final   Candida albicans NOT DETECTED NOT DETECTED Final   Candida glabrata NOT DETECTED NOT DETECTED Final   Candida krusei NOT DETECTED NOT DETECTED Final   Candida parapsilosis NOT DETECTED NOT DETECTED Final   Candida tropicalis NOT DETECTED NOT DETECTED Final    Comment: Performed at [facility], [place] [address] [place], [place] [address]  Blood Culture (routine x 2)     Status: None (Preliminary result)   Collection Time: 12/01/2018  9:40 PM  Result Value Ref  Range Status   Specimen Description BLOOD LEFT ANTECUBITAL  Final   Special Requests   Final    BOTTLES DRAWN AEROBIC AND ANAEROBIC Blood Culture results may not be optimal due to an inadequate volume of blood received in culture bottles   Culture   Final    NO GROWTH < 12 HOURS Performed at [place] [address], [place], [place] [address]    Report Status PENDING  Incomplete  Expectorated sputum assessment w rflx to resp cult     Status: None   Collection Time: 11/21/18 12:42 AM  Result Value Ref Range Status   Specimen Description SPUTUM  Final   Special Requests NONE  Final   Sputum evaluation   Final    THIS SPECIMEN IS ACCEPTABLE FOR SPUTUM CULTURE Performed at [facility], [place] [address] [place], [place] [address]    Report Status 11/21/2018 FINAL  Final  Culture, respiratory     Status: None (Preliminary result)   Collection Time: 11/21/18 12:42 AM  Result Value Ref Range Status   Specimen Description SPUTUM  Final   Special Requests NONE Reflexed from [id number]  Final   Gram Stain   Final    FEW WBC PRESENT, PREDOMINANTLY PMN ABUNDANT GRAM NEGATIVE RODS FEW GRAM POSITIVE RODS RARE GRAM POSITIVE COCCI Performed at [facility], [address], [place], [place] [address]    Culture PENDING  Incomplete   Report Status PENDING  Incomplete  SARS Coronavirus 2 (CEPHEID- Performed in [place] hospital lab), Hosp Order     Status: None   Collection Time: 11/21/18  4:54 AM  Result Value Ref Range Status   SARS Coronavirus 2 NEGATIVE NEGATIVE Final    Comment: (NOTE) If result is NEGATIVE SARS-CoV-2 target nucleic acids are NOT DETECTED. The SARS-CoV-2 RNA is generally detectable in  upper and lower  respiratory specimens during the acute phase of infection. The lowest  concentration of SARS-CoV-2 viral copies this assay can detect is 250  copies / mL. A negative result does not preclude SARS-CoV-2 infection  and should not be used as the sole  basis for treatment or other  patient management decisions.  A negative result may occur with  improper specimen collection / handling, submission of specimen other  than nasopharyngeal swab, presence of viral mutation(s) within the  areas targeted by this assay, and inadequate number of viral copies  (<250 copies / mL). A negative result must be combined with clinical  observations, patient history, and epidemiological information. If result is POSITIVE SARS-CoV-2 target nucleic acids are DETECTED. The SARS-CoV-2 RNA is generally detectable in upper and lower  respiratory specimens dur ing the acute phase of infection.  Positive  results are indicative of active infection with SARS-CoV-2.  Clinical  correlation with patient history and other diagnostic information is  necessary to determine patient infection status.  Positive results do  not rule out bacterial infection or co-infection with other viruses. If result is PRESUMPTIVE POSTIVE SARS-CoV-2 nucleic acids MAY BE PRESENT.   A presumptive positive result was obtained on the submitted specimen  and confirmed on repeat testing.  While 2019 novel coronavirus  (SARS-CoV-2) nucleic acids may be present in the submitted sample  additional confirmatory testing may be necessary for epidemiological  and / or clinical management purposes  to differentiate between  SARS-CoV-2 and other Sarbecovirus currently known to infect humans.  If clinically indicated additional testing with an alternate test  methodology [id number]) is advised. The SARS-CoV-2 RNA is generally  detectable in upper and lower respiratory sp ecimens during the acute  phase of infection. The expected result is Negative. Fact Sheet for Patients:  StrictlyIdeas.no Fact Sheet for Healthcare Providers: BankingDealers.co.za This test is not yet approved or cleared by the [place] FDA and has been authorized for detection  and/or diagnosis of SARS-CoV-2 by FDA under an Emergency Use Authorization (EUA).  This EUA will remain in effect (meaning this test can be used) for the duration of the COVID-19 declaration under Section 564(b)(1) of the Act, 21 U.S.C. section 360bbb-3(b)(1), unless the authorization is terminated or revoked sooner. Performed at [facility], [place] [address], [place], [place] [address]      Studies: No results found.  Scheduled Meds:  amLODipine  10 mg Per Tube Daily   chlorhexidine  15 mL Mouth Rinse BID   enoxaparin (LOVENOX) injection  30 mg Subcutaneous Daily   feeding supplement (ENSURE ENLIVE)  237 mL Per Tube 6 X Daily   feeding supplement (PRO-STAT SUGAR FREE 64)  30 mL Per Tube BID   free water  100 mL Per Tube 6 X Daily   free water  300 mL Per Tube Q8H   mouth rinse  15 mL Mouth Rinse q12n4p   Melatonin  3 mg Per Tube QHS   methylphenidate  5 mg Per Tube BID WC   tiZANidine  4 mg Per Tube QHS    Continuous Infusions:  sodium chloride 10 mL/hr at 11/21/18 0503   cefTRIAXone (ROCEPHIN)  IV Stopped (11/21/18 1628)     Time spent: 17mins, case discussed with RN at bedside, case discussed with palliative care over the phone I have personally reviewed and interpreted on  12/19/18 daily labs, tele strips, imagings as discussed above under date review session and assessment and plans.  I reviewed  all nursing notes, pharmacy notes, consultant notes,  vitals, pertinent old records  I have discussed plan of care as described above with RN , patient  on 12/07/2018   Florencia Reasons MD, PhD  [facility] Pager [phone]. If 7PM-7AM, please contact night-coverage at [email or web address], password [facility] 07-Dec-2018, 7:52 AM  LOS: 1 day

## 2018-12-11 NOTE — Progress Notes (Signed)
Pt stopped breathing while in room pulse lung sounds checked for 2 minutes, verified with 2nd nurse Darryl Rn time of death 10-04-00. Family at the bedside wife, brother and daughter, emotional assistance offered did not need revisit with chaplain. k sckorr called and to the bedside to fill out death certificate   [place] donor called pt not suitable for donation # [id number]. Post mortem care carried out will move to morgue all belongings sent with family

## 2018-12-11 NOTE — Progress Notes (Signed)
RT called to assess patient. He has been on a NRB, and has required fairly frequent NTS. NTS for the second time this AM for a large amount on yellow/green blood tinged secretions. BBS improved after NTS, but still anxious, SAT 89% currently. Spoke with RN about further measures, patient is DNR. Not sure if he would wear BiPAP due to his anxiety, and not sure if family would want that. RN to touch base with family.

## 2018-12-11 NOTE — Consult Note (Signed)
Consultation Note Date: December 22, 2018   Patient Name: Adam Yang  DOB: May 08, 1946  MRN: [id number]  Age / Sex: 73 y.o., male   PCP: Adam Cruel, MD Referring Physician: Florencia Reasons, MD   REASON FOR CONSULTATION:Establishing goals of care  Palliative Care consult requested for this 73 y.o. male with multiple medical problems including hemorrhagic CVA with dysphagia, aphasia, PEG tube, right sided-hemiparesis, type 2 diabetes, CKD stage III, nasopharyngeal carcinoma s/p radiation and chemotherapy, hypertension, chronic cough, hyperlipidemia, inguinal hernia, and anxiety. He was admitted from home after wife reported concerns of nausea, vomiting, and worsening hypoxia. Patient recently admitted for aspiration. Since admission he continues to be hypoxic requiring 100% nonrebreather. Chest x-ray showed congestive pattern. He was started on antibiotics.   Clinical Assessment and Goals of Care: I have reviewed medical records including lab results, imaging, Epic notes, and MAR, received report from the bedside RN, and assessed the patient. I spoke with patient's wife, Adam Yang and her daughter Adam Yang via phone to discuss diagnosis prognosis, GOC, EOL wishes, disposition and options. Patient is minimally responsive, has audible congestion, copious secretions, remains on NRB, and showing signs of continuous respiratory failure and discomfort.   I introduced Palliative Medicine as specialized medical care for people living with serious illness. It focuses on providing relief from the symptoms and stress of a serious illness. The goal is to improve quality of life for both the patient and the family.  We discussed a brief life review of the patient, along with his functional and nutritional status. Wife reports they have been married for 36 years. 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 They have 1 daughter named  Adam Yang. He is a man of [place]. 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   Prior to admission patient required assistance with ADLs. His wife reports she would have to provide frequent suctioning, and also feedings via PEG 3-4 times per day. He suffered from dysphagia due to his cancer treatments which worsened after having a stroke. He was left-handed and could assist with left hand given right-sided hemiparesis. He was ambulatory for short distances with stand by and walker assistance.   We discussed His current illness and what it means in the larger context of His on-going co-morbidities. With specific discussions regarding his poor prognosis, respiratory failure, and acute on chronic kidney disease. Natural disease trajectory and expectations at EOL were discussed. Wife was tearful in discussion.   She expressed she was not naive and felt that when he returned to the hospital this admission he would not do well. She reports continuous complications with aspiration and respiratory distress over the past several weeks. She reports speaking to patient's PCP who explained to her patient's condition and encouraged her to strongly consider how aggressive she wished to continue to be in his care knowing his co-morbidities and continuous health struggles. She expressed "I received your calls and voicemails yesterday but I was stalling because I knew in my heart he wasn't doing good and I wasn't ready to say let him go!" She was tearful in conversation. Support given. Wife explained she is a retired Therapist, sports and know that he survived the length of time he did due to her knowledge and dedication to his care.   I attempted to elicit values and goals of care important to  the patient.    The difference between aggressive medical intervention and comfort care was considered in light of the patient's goals of care. Wife and daughter  agreed that their goal was for [name] to be kept comfortable and not to suffer. She expressed the week prior to his admission he seemed to show signs of hallucination and when his daughter asked who and what he was gazing and smiling at he informed them it was his parents and he was going to be with his mom! Wife was tearful and expressed she knew he was preparing them as he requested to come to the hospital and made previous comments he may not see his home again. Support given.   Wife is requesting to transition care to comfort. She is tearful and states "please don't let him suffer, he has suffered long enough and I promised him I wouldn't!": I educated family on what comfort care measures would look like. I discussed with them with comfort care, patient would no longer receive aggressive medical interventions such as continuous vital signs, lab work, radiology testing, or medications not focused on comfort. All care will focus on how the patient is looking and feeling. This will include management of any symptoms that may cause discomfort, pain, shortness of breath, cough, nausea, agitation, anxiety, and/or secretions etc. Symptoms will be managed with medications and other non-pharmacological interventions such as spiritual support if requested, repositioning, music therapy, or therapeutic listening. Family verbalized understanding and appreciation.   Wife confirms DNR/DNI. She again request to transition to comfort care. We discussed his respiratory distress and the need for continuous morphine versus prn dosing. She agrees that a morphine drip would provide more comfort as patient is physically struggling respiratory wise with use of accessory muscles and tachypneic.   Wife and daughter aware given severity of his condition, transitioning to full comfort, and prognosis of hours to days she and her daughter will be allowed to visit during this time for support. Family verbalized understanding. I  discussed in details restrictions on visitation and briefly screened family for symptoms and/or contact with other individuals who may be known positive for COVID-19 or symptomatic. Both wife and daughter denied contact with individuals or any known symptoms.   Questions and concerns were addressed.  Hard Choices booklet left for review. The family was encouraged to call with questions or concerns.  PMT will continue to support holistically.   SOCIAL HISTORY:     reports that he quit smoking about 20 years ago. He has a 25.00 pack-year smoking history. He has never used smokeless tobacco. He reports that he does not drink alcohol or use drugs.  CODE STATUS: DNR  ADVANCE DIRECTIVES: Primary Decision Maker: Cathy Kluttz (wife)  HCPOA: YES   SYMPTOM MANAGEMENT:  Morphine drip with prn bolus  Robinul PRN for excessive secretions  Ativan PRN for anxiety/agitation  Scopolamine patch for secretions  Palliative Prophylaxis:   Aspiration, Bowel Regimen, Delirium Protocol, Eye Care, Oral Care and Turn Reposition  PSYCHO-SOCIAL/SPIRITUAL:  Support System: Family   Desire for further Chaplaincy support:YES   Additional Recommendations (Limitations, Scope, Preferences):  Full Comfort Care   PAST MEDICAL HISTORY: Past Medical History:  Diagnosis Date   Anxiety    Claustrophobia   Arthritis    Cancer ([place])    nasopharynx   Chronic cough    EXCESS MUCUS BUILDUP SINCE ORAL SURGERY FROM CANCER    Chronic kidney disease    Renal Insuffiency , Creatinine has been in the  2- 3 range, 11/27/14 Creatine 1.4   Diabetes mellitus without complication ([place])    Type II   Gout    History of radiation therapy 12/23/2014- 02/10/2015   Nasopharynx / Neck Adenopathy   Hyperlipidemia    Hypertension    Inguinal hernia    Right inguinal hernia 08/23/2017    PAST SURGICAL HISTORY:  Past Surgical History:  Procedure Laterality Date   colonoscopy     COLONOSCOPY W/ POLYPECTOMY      INGUINAL HERNIA REPAIR Right 08/23/2017   Procedure: OPEN REPAIR RIGHT INGUINAL HERNIA WITH MESH;  Surgeon: Fanny Skates, MD;  Location: [facility];  Service: General;  Laterality: Right;  GENERAL AND TAP BLOCK   INSERTION OF MESH Right 08/23/2017   Procedure: INSERTION OF MESH;  Surgeon: Fanny Skates, MD;  Location: [facility];  Service: General;  Laterality: Right;  GENERAL AND TAP BLOCK   IR GASTROSTOMY TUBE MOD SED  02/06/2018   IR [place] GASTRO/COLONIC TUBE PERCUT W/FLUORO  04/10/2018   IR [place] GASTRO/COLONIC TUBE PERCUT W/FLUORO  10/26/2018   KNEE SURGERY Left 1990's   tibia crushed, pinned and bone grafted   LYMPH NODE BIOPSY     MULTIPLE EXTRACTIONS WITH ALVEOLOPLASTY N/A 12/06/2014   Procedure: Extraction of tooth #'s 2,4,5,6,7,8,9,10,11,12,13,14,15, 20,22,26,27,28,29 with alveoloplasty.;  Surgeon: Lenn Cal, DDS;  Location: [facility];  Service: Oral Surgery;  Laterality: N/A;    ALLERGIES:  is allergic to cefepime and flagyl [metronidazole].   MEDICATIONS:  Current Facility-Administered Medications  Medication Dose Route Frequency Provider Last Rate Last Dose   0.9 %  sodium chloride infusion   Intravenous PRN Rise Patience, MD 10 mL/hr at 11/21/18 0503     acetaminophen (TYLENOL) tablet 650 mg  650 mg Oral Q6H PRN Rise Patience, MD       Or   acetaminophen (TYLENOL) suppository 650 mg  650 mg Rectal Q6H PRN Rise Patience, MD   650 mg at 11/21/18 1844   amLODipine (NORVASC) tablet 10 mg  10 mg Per Tube Daily Rise Patience, MD   10 mg at 26-Nov-2018 0932   cefTRIAXone (ROCEPHIN) 2 g in sodium chloride 0.9 % 100 mL IVPB  2 g Intravenous Q24H Pokhrel, Laxman, MD   Stopped at 11/21/18 1628   chlorhexidine (PERIDEX) 0.12 % solution 15 mL  15 mL Mouth Rinse BID Pokhrel, Laxman, MD   15 mL at 2018/11/26 0932   diphenhydrAMINE (BENADRYL) injection 12.5 mg  12.5 mg Intravenous Q6H PRN Rise Patience, MD   12.5 mg at 11/26/2018 0014   enoxaparin  (LOVENOX) injection 30 mg  30 mg Subcutaneous Daily Rise Patience, MD   30 mg at 2018-11-26 0931   feeding supplement (ENSURE ENLIVE) (ENSURE ENLIVE) liquid 237 mL  237 mL Per Tube 6 X Daily Rise Patience, MD   237 mL at 26-Nov-2018 0933   feeding supplement (PRO-STAT SUGAR FREE 64) liquid 30 mL  30 mL Per Tube BID Rise Patience, MD   30 mL at 2018-11-26 0932   free water 100 mL  100 mL Per Tube 6 X Daily Rise Patience, MD   100 mL at 11/26/18 0933   free water 300 mL  300 mL Per Tube Q8H Rise Patience, MD   300 mL at 26-Nov-2018 0933   glycopyrrolate (ROBINUL) injection 0.2 mg  0.2 mg Intravenous Q4H PRN Rise Patience, MD   0.2 mg at 26-Nov-2018 1007   MEDLINE mouth rinse  15  mL Mouth Rinse q12n4p Pokhrel, Laxman, MD       Melatonin TABS 3 mg  3 mg Per Tube QHS Schorr, Rhetta Mura, NP   3 mg at 2018/12/04 0349   methylphenidate (RITALIN) tablet 5 mg  5 mg Per Tube BID WC Rise Patience, MD   5 mg at 2018-12-04 0931   morphine 2 MG/ML injection 2 mg  2 mg Intravenous Q2H PRN Pokhrel, Laxman, MD   2 mg at 12-04-18 1007   ondansetron (ZOFRAN) tablet 4 mg  4 mg Oral Q6H PRN Rise Patience, MD       Or   ondansetron [facility]) injection 4 mg  4 mg Intravenous Q6H PRN Rise Patience, MD       tiZANidine (ZANAFLEX) tablet 4 mg  4 mg Per Tube QHS Schorr, Rhetta Mura, NP   4 mg at 12/04/2018 0348    VITAL SIGNS: BP 140/71 (BP Location: Right Arm)    Pulse (!) 115    Temp 99.5 F (37.5 C) (Axillary)    Resp (!) 21    Ht 5\' 9"  (1.753 m)    Wt 64.6 kg    SpO2 91%    BMI 21.03 kg/m  Filed Weights   12/07/2018 2106 11/21/18 0144  Weight: 71 kg 64.6 kg    Estimated body mass index is 21.03 kg/m as calculated from the following:   Height as of this encounter: 5\' 9"  (1.753 m).   Weight as of this encounter: 64.6 kg.  LABS: CBC:    Component Value Date/Time   WBC 7.9 12-04-18 0322   HGB 8.5 (L) Dec 04, 2018 0322   HGB 12.2 (L) 02/18/2017 1503   HCT  27.5 (L) 12-04-18 0322   HCT 35.9 (L) 02/18/2017 1503   PLT 188 2018/12/04 0322   PLT 254 02/18/2017 1503   Comprehensive Metabolic Panel:    Component Value Date/Time   NA 144 2018/12/04 0322   NA 144 03/06/2018   NA 139 02/18/2017 1502   K 4.4 12-04-2018 0322   K 4.3 02/18/2017 1502   CO2 28 12/04/2018 0322   CO2 27 02/18/2017 1502   BUN 104 (H) 04-Dec-2018 0322   BUN 56 (A) 03/06/2018   BUN 30.9 (H) 02/18/2017 1502   CREATININE 3.91 (H) 12/04/18 0322   CREATININE 1.6 (H) 02/18/2017 1502   ALBUMIN 2.4 (L) 12/04/2018 2110   ALBUMIN 3.0 (L) 02/18/2017 1502     Review of Systems  Unable to perform ROS: Acuity of condition    Physical Exam Vitals signs and nursing note reviewed.  Constitutional:      Appearance: He is underweight.     Comments: Chronically ill appearing   Cardiovascular:     Rate and Rhythm: Regular rhythm. Tachycardia present.     Pulses: Decreased pulses.     Heart sounds: Normal heart sounds.  Pulmonary:     Effort: Tachypnea, accessory muscle usage and respiratory distress present.     Breath sounds: Decreased breath sounds and rhonchi present.     Comments: Audible congestion, NRB  Abdominal:     General: Bowel sounds are normal.     Palpations: Abdomen is soft.     Comments: PEG  Skin:    General: Skin is warm and dry.  Neurological:     Mental Status: He is lethargic.     Motor: Weakness and atrophy present.     Comments: Unable to assess   Psychiatric:        Cognition  and Memory: Cognition is impaired. Memory is impaired.        Judgment: Judgment is inappropriate.   Prognosis: Hours - Days in the setting of severe hypoxic respiratory distress, NRB, audible congestion, copious secretions, desaturation on NRB despite deep suctioning, right side hemiparesis r/t previous CVA, poor po intake, aspiration pna, history of nasopharyngeal cancer, type 2 diabetes, hypertension, dysphagia   Discharge Planning:  Anticipated Hospital  Death  Recommendations:  DNR/DNI-as confirmed by wife  Full comfort care at wife's request  Wife expressed awareness of patient's decline, respiratory  Failure, and poor prognosis. Expressed goals are for comfort care and to manage symptoms and eliminate suffering per wife.  Discussed in detail use of prn morphine verus initiation of morphine drip. Wife does not want patient suffering and his request previously to her was to not allow him to gasp or feel as if he is suffocating. Wife has decided to proceed with morphine drip for comfort.   Will d/c medications not comfort focused  Morphine drip with prn bolus  Robinul PRN for excessive secretions  Ativan PRN for anxiety/agitation  Scopolamine patch for secretions  Family (Cathy and [employer]) are allowed to visit for due to end of life   PMT will continue to support and follow   Palliative Performance Scale: PPS 10%              Family expressed understanding and was in agreement with this plan.   Thank you for allowing the Palliative Medicine Team to assist in the care of this patient.  Time In: 1145 Time Out: 1300 Time Total: 75 min.   Visit consisted of counseling and education dealing with the complex and emotionally intense issues of symptom management and palliative care in the setting of serious and potentially life-threatening illness.Greater than 50%  of this time was spent counseling and coordinating care related to the above assessment and plan.  Signed by:  Alda Lea, AGPCNP-BC Palliative Medicine Team  Phone: [phone] Fax: [phone] Pager: [phone] Amion: Bjorn Pippin

## 2018-12-11 NOTE — Progress Notes (Signed)
RT nasotracheal suctioned patient for the second time this shift to clear secretions.  Patient is still to weak to produce a cough and generate mucus on his own.  Deep oral suction does not work for patient.  I retrieved a good amount of thick yellow secretions again from patients right side.  RT will continue to monitor and assess for more NTS as needed.

## 2018-12-11 NOTE — Progress Notes (Signed)
While rounding floor, Nurse suggested family needing spiritual support. I entered room and family was at bedside. NP was also present tending PT's wife. Wife asked that I pray, sign a song, and read scripture. She stated that pt was a Investment banker, corporate for [employer], and that he will benefit from hearing a song. Also, she stated that she knows that visitors. Like her Doristine Bosworth cannot be present and was thankful for the Chaplain presence.  I offered spiritual support with words of comfort, empathic listening, ministry of presence, prayer and singing. NP sang along with [place]. Family was very thankful for the staff support.  Chaplain Fidel Levy [phone]

## 2018-12-11 DEATH — deceased

## 2019-02-05 IMAGING — CT CT HEAD W/O CM
3 series · 15 of 47 positions shown, 18 images · non-contrast
Comparison: Prior CT from 01/28/2018

CLINICAL DATA: Follow-up examination for intracranial hemorrhage.
Found sitting on floor.

EXAM:
CT HEAD WITHOUT CONTRAST
TECHNIQUE: Contiguous axial images were obtained from the base of the skull
through the vertex without intravenous contrast.

[Series 3: head 5.0 h30s · axial · 0.42mm/px · z∈[-105,+30]mm · 9 of 33 slices shown, 12 images]
[im 3/33  brain]
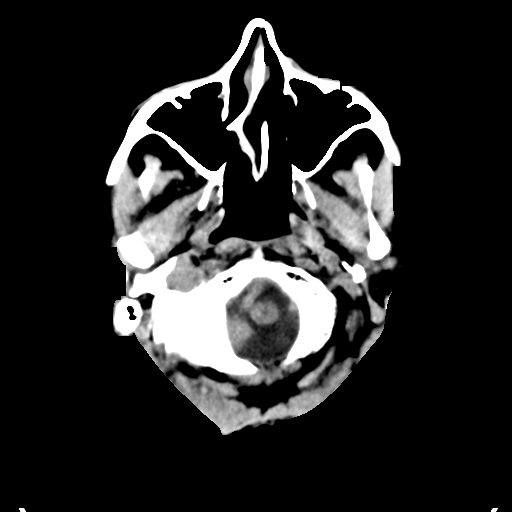
[im 3/33  bone]
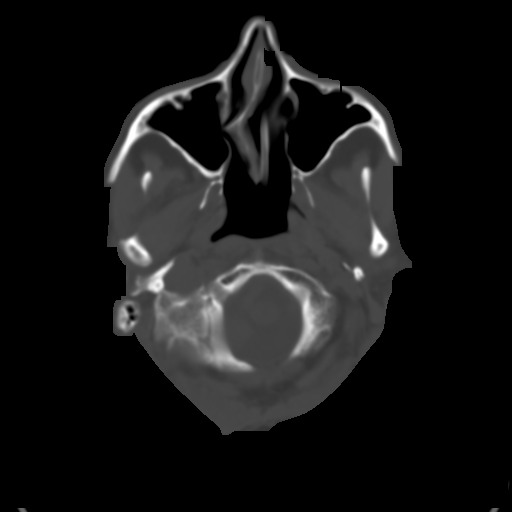
[im 6/33  brain]
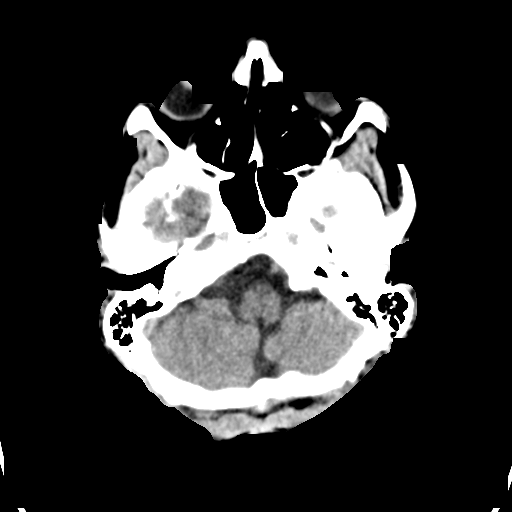
[im 9/33  brain]
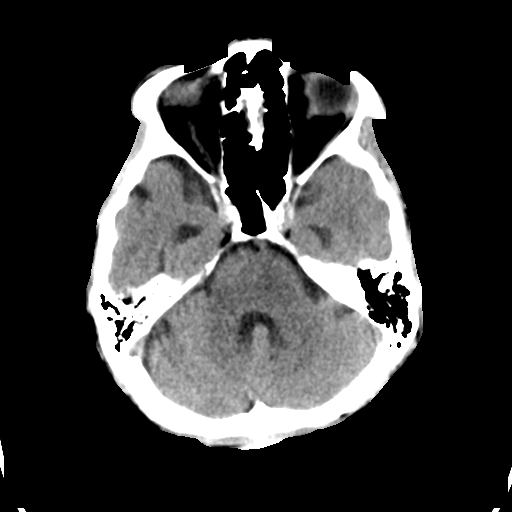
[im 13/33  brain]
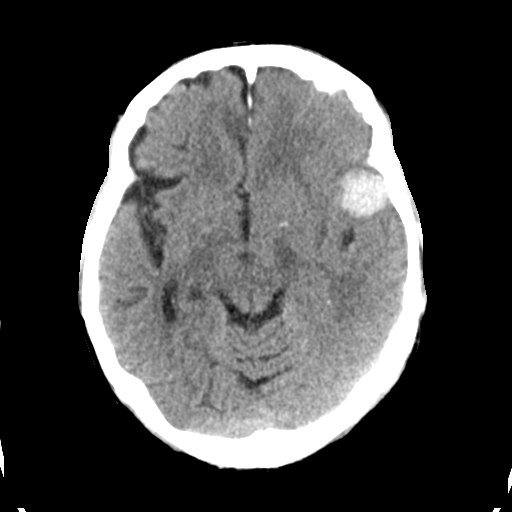
[im 17/33  brain]
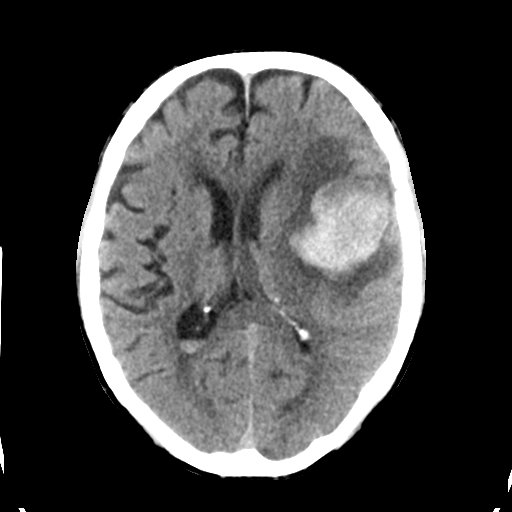
[im 17/33  bone]
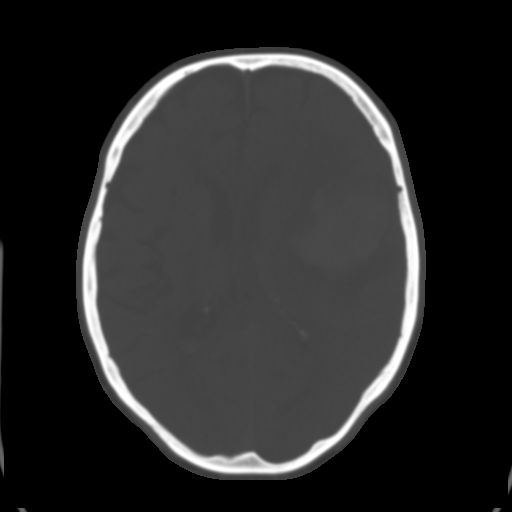
[im 20/33  brain]
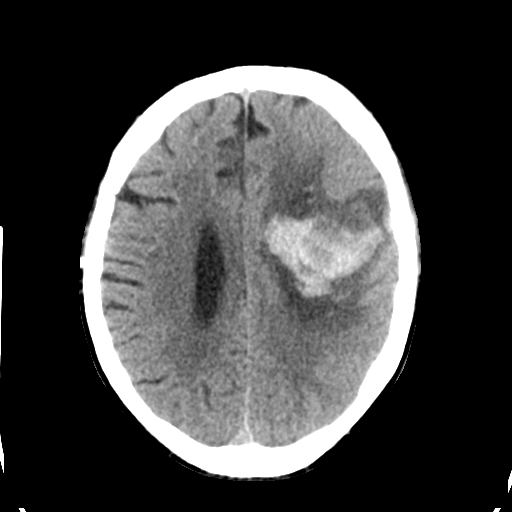
[im 24/33  brain]
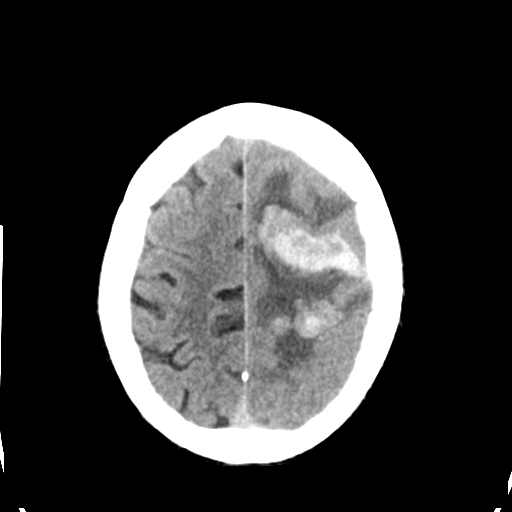
[im 27/33  brain]
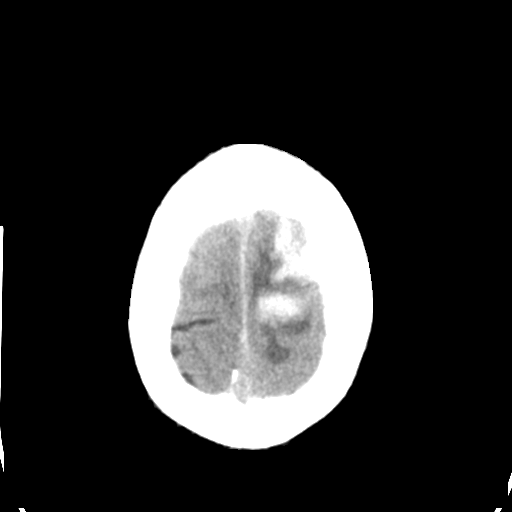
[im 30/33  brain]
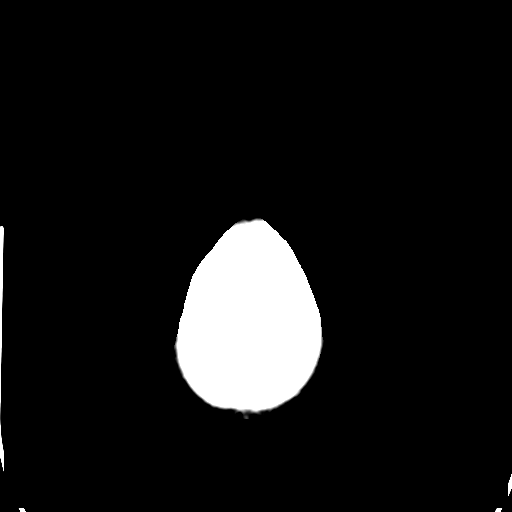
[im 30/33  bone]
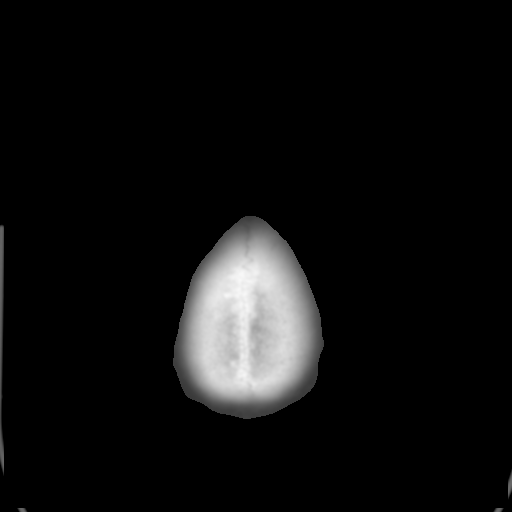

[Series 5: head 3.0 mpr cor · coronal · 0.32mm/px · 3 of 67 slices shown]
[im 23/67  brain]
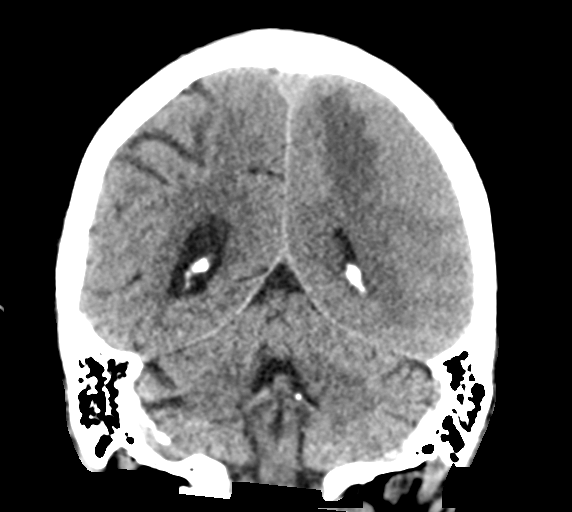
[im 30/67  brain]
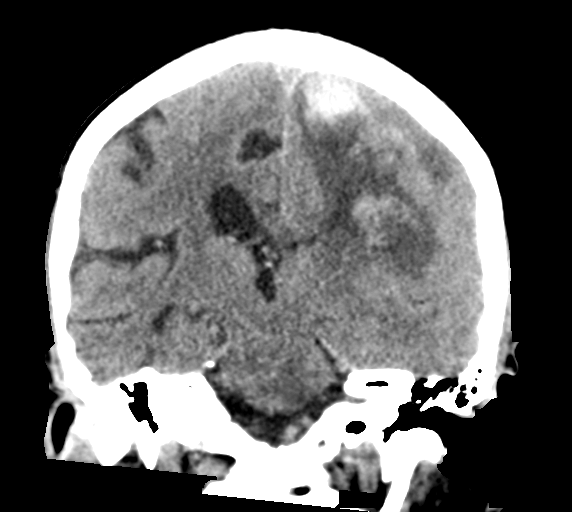
[im 37/67  brain]
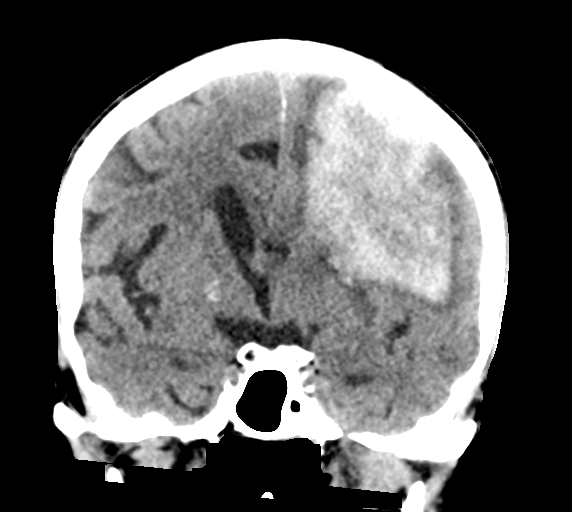

[Series 6: head 3.0 mpr sag · sagittal · 0.32mm/px · 3 of 59 slices shown]
[im 20/59  brain]
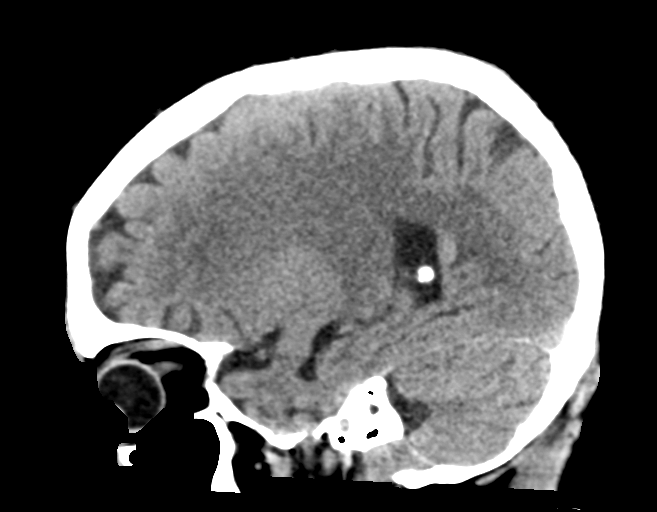
[im 30/59  brain]
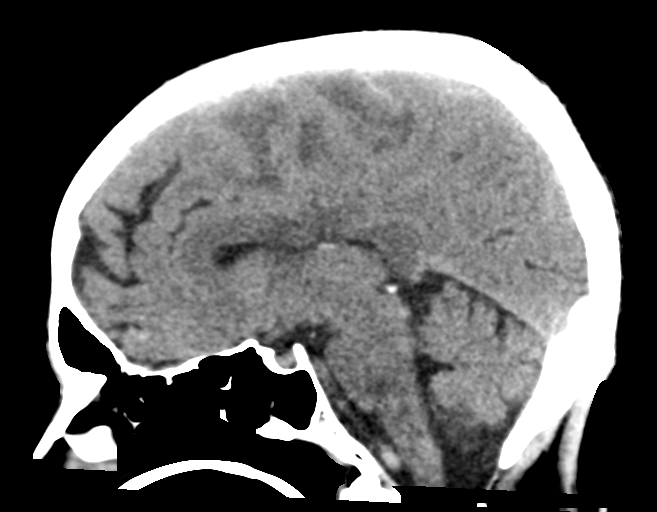
[im 39/59  brain]
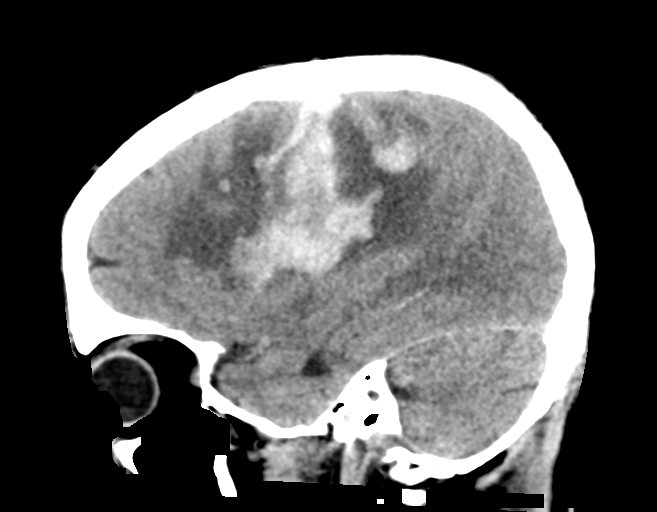

[15 of 47 positions shown; findings below may reference images not displayed]

FINDINGS: Brain: Previously identified large intraparenchymal hemorrhage
centered at the left frontal white matter overall similar in size
and morphology, although is slightly decreased in density as
compared to previous exam, compatible with evolving hematoma.
Associated regional mass effect with vasogenic edema is slightly
worsened, with slightly worsened 6 mm of left-to-right shift. Small
volume degenerating blood products present within the occipital
horns of both lateral ventricles. Ventricular size relatively stable
without hydrocephalus or ventricular trapping. Basilar cisterns
remain patent. No new intracranial hemorrhage.

No other acute intracranial abnormality. No other acute large vessel
territory infarct. No extra-axial fluid collection. No mass lesion.

Vascular: No hyperdense vessel. Scattered vascular calcifications
noted within the carotid siphons.

Skull: Scalp soft tissues and calvarium demonstrate no acute
abnormality.

Sinuses/Orbits: Globes and orbital soft tissues demonstrate no acute
finding chronic mucosal thickening within the right maxillary sinus.
Nasogastric tube in place. Trace opacity right mastoid air cells.

Other: None.
IMPRESSION: 1. Continued interval evolution of large intraparenchymal hemorrhage
centered at the left frontal lobe, similar in size and morphology
from previous. Mildly increased vasogenic edema with regional mass
effect, with slightly worsened 6 mm right-to-left midline shift.
2. Small volume intraventricular hemorrhage, similar to previous. No
hydrocephalus or ventricular trapping.
3. No other new acute intracranial abnormality.

## 2019-02-25 IMAGING — DX DG CHEST 1V PORT
1 series · 1 of 1 positions shown · non-contrast
Comparison: 02/18/2018, PET CT 06/17/2015

CLINICAL DATA: Pneumonia

EXAM:
PORTABLE CHEST 1 VIEW

[chest ap]
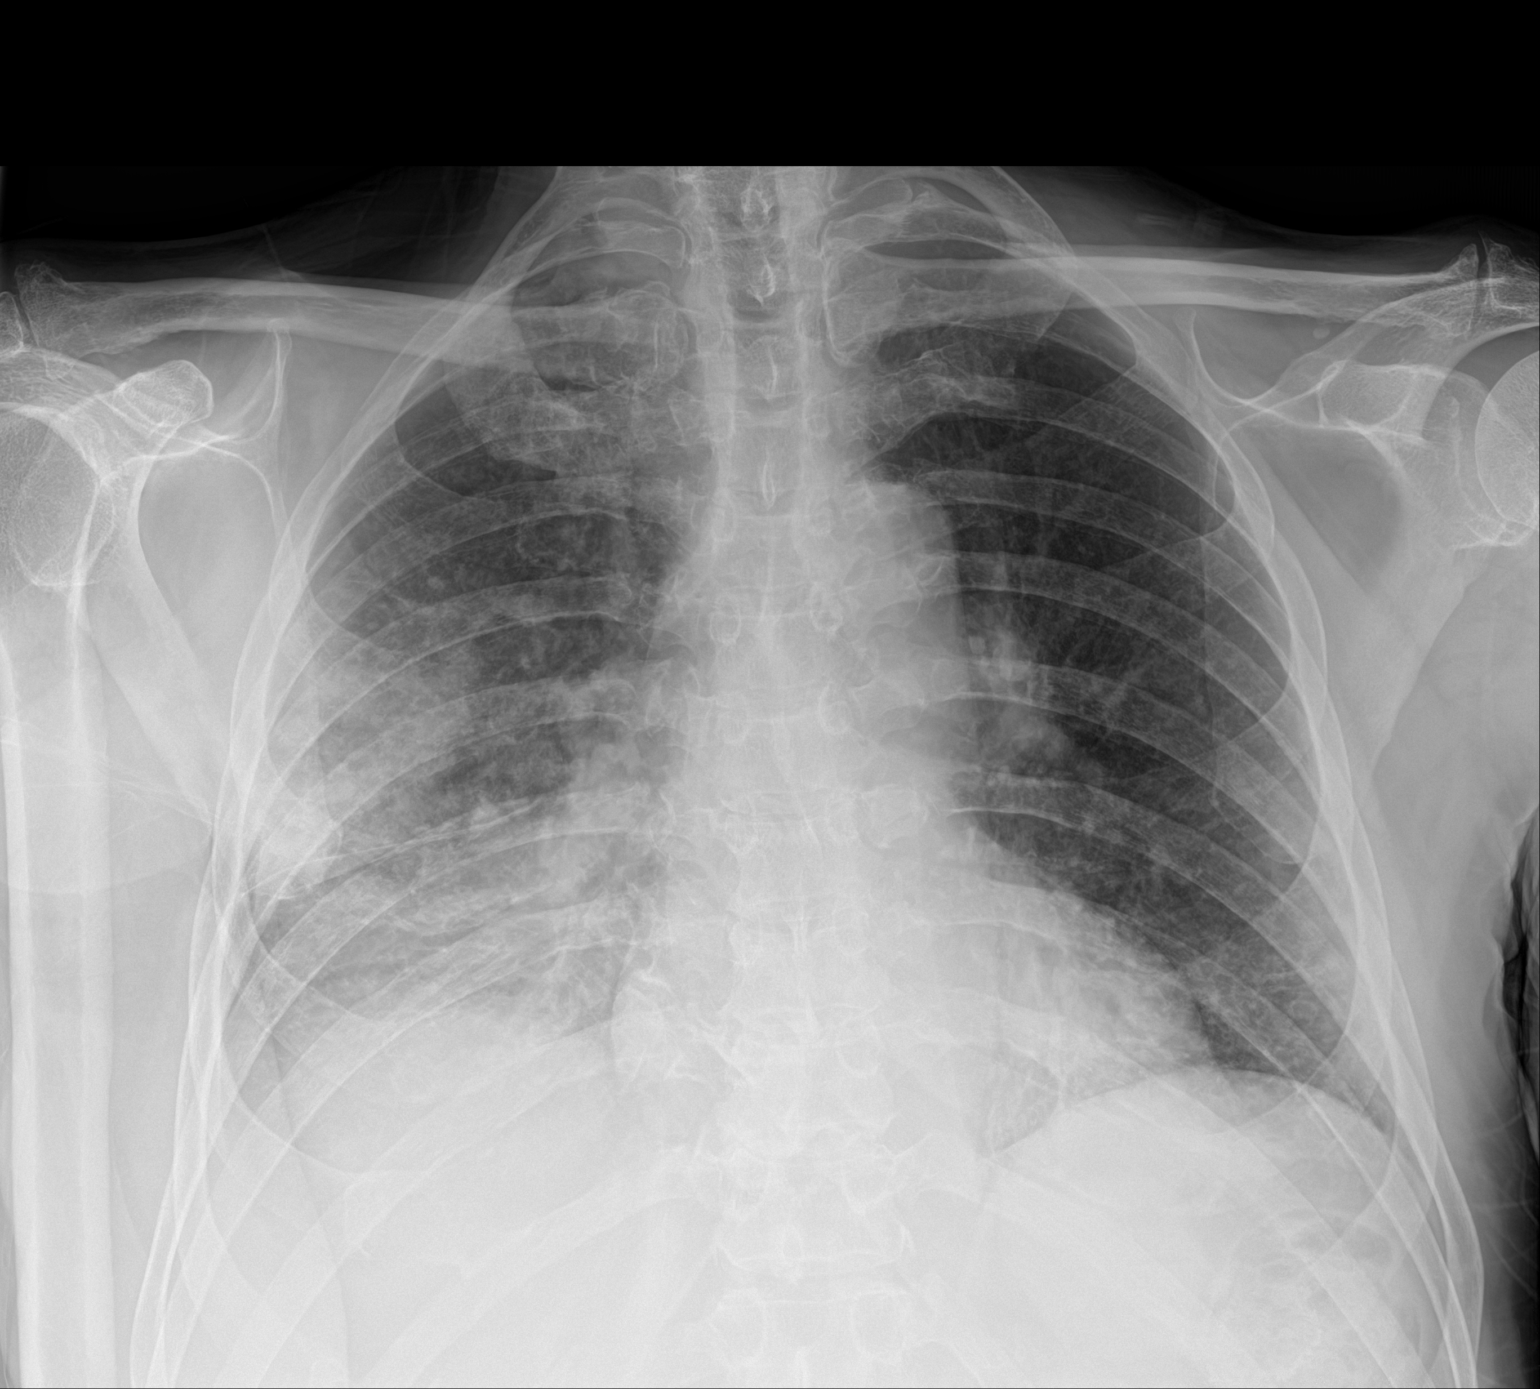

[1 of 1 positions shown; findings below may reference images not displayed]

FINDINGS: Worsened airspace disease at the left base and right mid to lower
lung. No pleural effusion. Stable cardiomediastinal silhouette. No
pneumothorax.
IMPRESSION: Worsening of bilateral right greater than left lower lung pneumonia.

## 2019-11-22 IMAGING — DX PORTABLE CHEST - 1 VIEW
1 series · 1 of 1 positions shown · non-contrast
Comparison: 10/26/2018

CLINICAL DATA: Hypoxia, shortness of breath.

EXAM:
PORTABLE CHEST 1 VIEW

[chest]
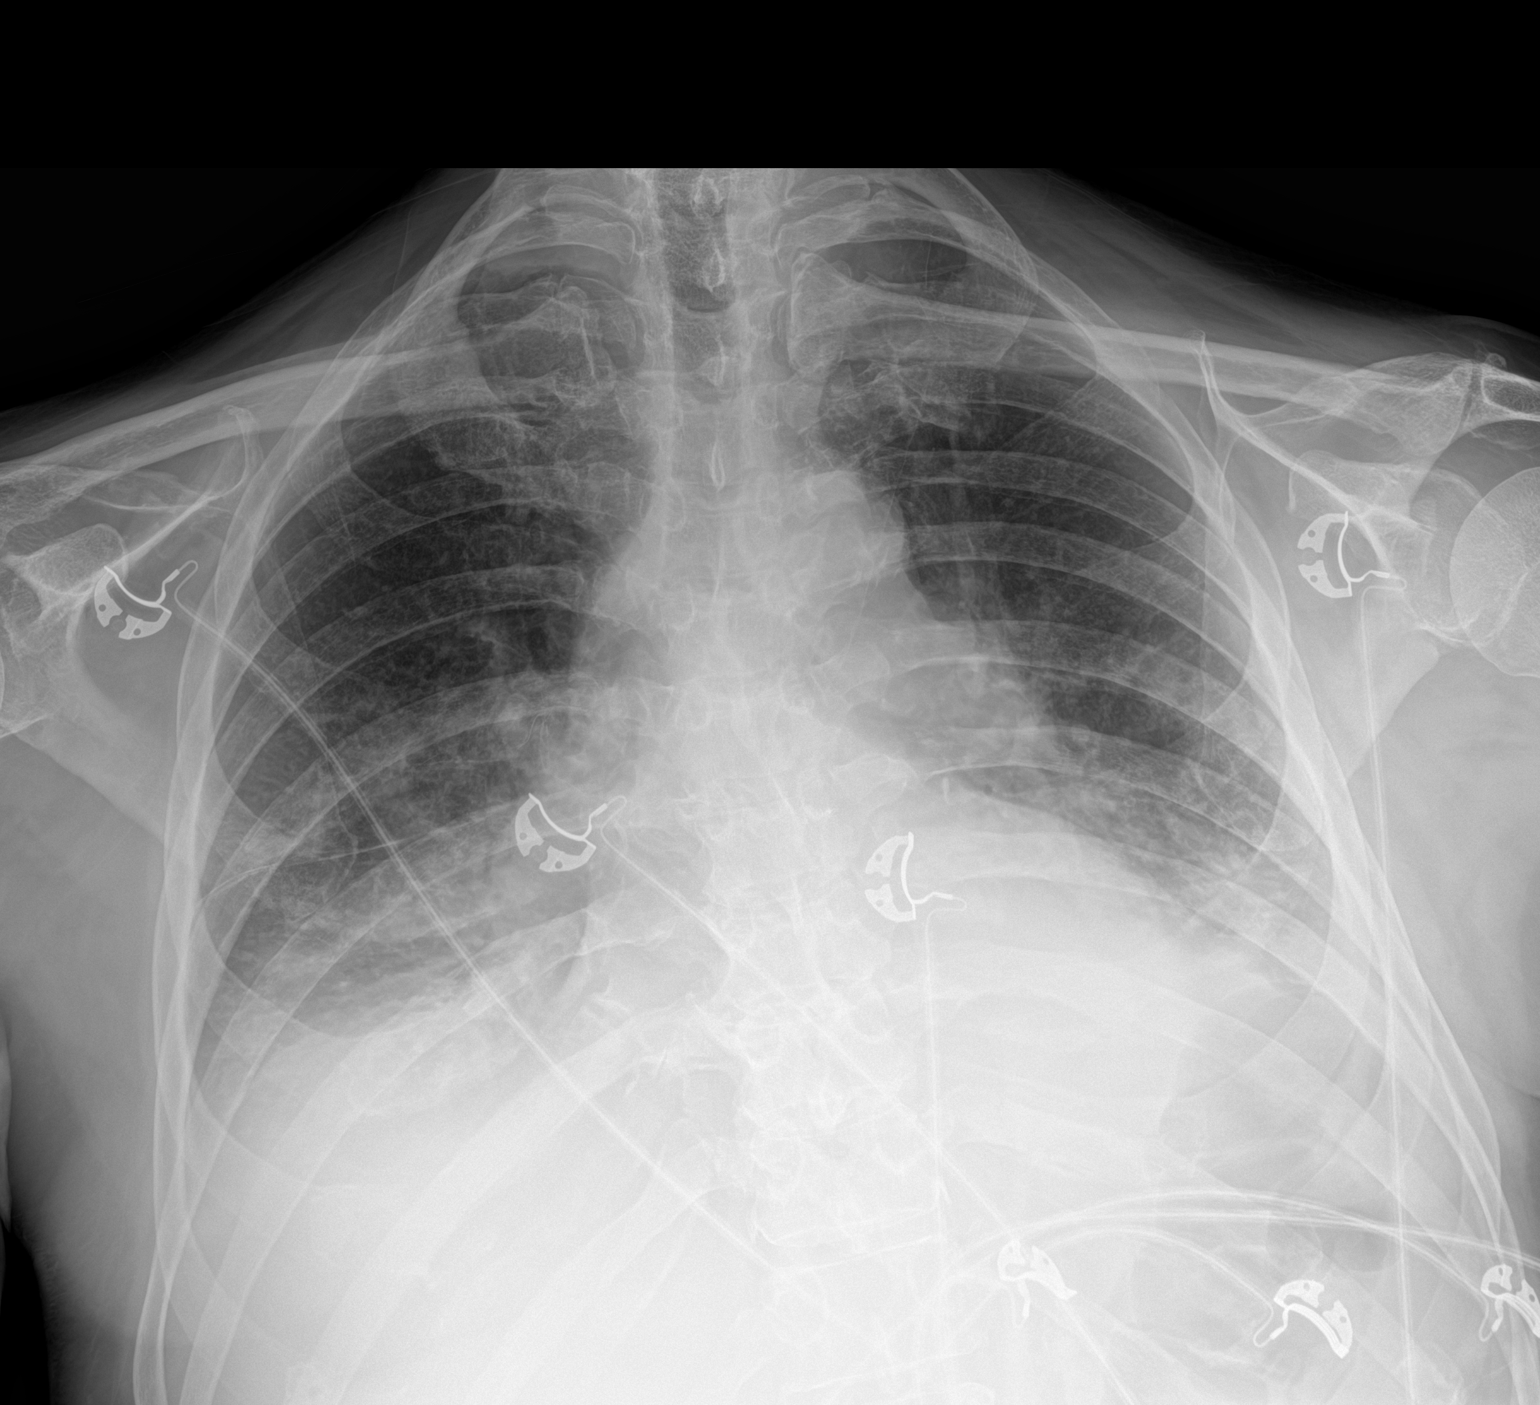

[1 of 1 positions shown; findings below may reference images not displayed]

FINDINGS: Normal heart size. Bilateral pleural effusions with diminished
aeration to the lung bases noted. Pulmonary vascular congestion
noted. Visualized osseous structures are unremarkable.
IMPRESSION: 1. Congestive heart failure.
2. Diminished aeration to both lung bases.

## 2023-04-26 NOTE — Telephone Encounter (Signed)
TC
